# Patient Record
Sex: Male | Born: 1945 | Race: White | Hispanic: No | Smoking: Former smoker
Health system: Southern US, Community
[De-identification: ages and names within clinical notes are randomized; demographics above are authoritative.]

## PROBLEM LIST (undated history)

## (undated) DIAGNOSIS — R202 Paresthesia of skin: Secondary | ICD-10-CM

## (undated) DIAGNOSIS — I499 Cardiac arrhythmia, unspecified: Secondary | ICD-10-CM

## (undated) DIAGNOSIS — I480 Paroxysmal atrial fibrillation: Secondary | ICD-10-CM

## (undated) DIAGNOSIS — R200 Anesthesia of skin: Secondary | ICD-10-CM

## (undated) DIAGNOSIS — I10 Essential (primary) hypertension: Secondary | ICD-10-CM

## (undated) DIAGNOSIS — I6523 Occlusion and stenosis of bilateral carotid arteries: Secondary | ICD-10-CM

## (undated) DIAGNOSIS — N400 Enlarged prostate without lower urinary tract symptoms: Secondary | ICD-10-CM

## (undated) DIAGNOSIS — M1990 Unspecified osteoarthritis, unspecified site: Secondary | ICD-10-CM

## (undated) DIAGNOSIS — L570 Actinic keratosis: Secondary | ICD-10-CM

## (undated) DIAGNOSIS — E119 Type 2 diabetes mellitus without complications: Secondary | ICD-10-CM

## (undated) DIAGNOSIS — M549 Dorsalgia, unspecified: Secondary | ICD-10-CM

## (undated) DIAGNOSIS — I2510 Atherosclerotic heart disease of native coronary artery without angina pectoris: Secondary | ICD-10-CM

## (undated) DIAGNOSIS — C4490 Unspecified malignant neoplasm of skin, unspecified: Secondary | ICD-10-CM

## (undated) DIAGNOSIS — K219 Gastro-esophageal reflux disease without esophagitis: Secondary | ICD-10-CM

## (undated) DIAGNOSIS — R0600 Dyspnea, unspecified: Secondary | ICD-10-CM

## (undated) DIAGNOSIS — E785 Hyperlipidemia, unspecified: Secondary | ICD-10-CM

## (undated) DIAGNOSIS — C801 Malignant (primary) neoplasm, unspecified: Secondary | ICD-10-CM

## (undated) HISTORY — DX: Actinic keratosis: L57.0

## (undated) HISTORY — PX: PORT-A-CATH REMOVAL: SHX5289

## (undated) HISTORY — DX: Gastro-esophageal reflux disease without esophagitis: K21.9

## (undated) HISTORY — PX: TONSILLECTOMY: SUR1361

## (undated) HISTORY — PX: ABDOMINAL SURGERY: SHX537

## (undated) HISTORY — DX: Essential (primary) hypertension: I10

## (undated) HISTORY — DX: Hyperlipidemia, unspecified: E78.5

## (undated) HISTORY — PX: JOINT REPLACEMENT: SHX530

## (undated) HISTORY — PX: OTHER SURGICAL HISTORY: SHX169

---

## 2003-04-01 DIAGNOSIS — I10 Essential (primary) hypertension: Secondary | ICD-10-CM | POA: Insufficient documentation

## 2003-12-08 HISTORY — PX: COLONOSCOPY: SHX174

## 2004-03-31 DIAGNOSIS — C801 Malignant (primary) neoplasm, unspecified: Secondary | ICD-10-CM

## 2004-03-31 DIAGNOSIS — Z95828 Presence of other vascular implants and grafts: Secondary | ICD-10-CM

## 2004-03-31 DIAGNOSIS — C01 Malignant neoplasm of base of tongue: Secondary | ICD-10-CM

## 2004-03-31 HISTORY — PX: TONGUE BIOPSY: SHX1075

## 2004-03-31 HISTORY — DX: Malignant neoplasm of base of tongue: C01

## 2004-03-31 HISTORY — DX: Malignant (primary) neoplasm, unspecified: C80.1

## 2004-03-31 HISTORY — DX: Presence of other vascular implants and grafts: Z95.828

## 2004-10-29 ENCOUNTER — Ambulatory Visit: Payer: Self-pay | Admitting: Otolaryngology

## 2004-11-05 ENCOUNTER — Ambulatory Visit: Payer: Self-pay | Admitting: Oncology

## 2004-11-08 ENCOUNTER — Ambulatory Visit: Payer: Self-pay | Admitting: Oncology

## 2004-11-13 ENCOUNTER — Ambulatory Visit: Payer: Self-pay | Admitting: General Surgery

## 2004-11-29 ENCOUNTER — Ambulatory Visit: Payer: Self-pay | Admitting: Oncology

## 2004-12-29 ENCOUNTER — Ambulatory Visit: Payer: Self-pay | Admitting: Oncology

## 2005-01-29 ENCOUNTER — Ambulatory Visit: Payer: Self-pay | Admitting: Oncology

## 2005-02-28 ENCOUNTER — Ambulatory Visit: Payer: Self-pay | Admitting: Oncology

## 2005-03-31 ENCOUNTER — Ambulatory Visit: Payer: Self-pay | Admitting: Oncology

## 2005-04-18 ENCOUNTER — Ambulatory Visit: Payer: Self-pay | Admitting: Oncology

## 2005-05-01 ENCOUNTER — Ambulatory Visit: Payer: Self-pay | Admitting: Oncology

## 2005-05-29 ENCOUNTER — Ambulatory Visit: Payer: Self-pay | Admitting: Oncology

## 2005-06-29 ENCOUNTER — Ambulatory Visit: Payer: Self-pay | Admitting: Oncology

## 2005-07-29 ENCOUNTER — Ambulatory Visit: Payer: Self-pay | Admitting: Oncology

## 2005-08-29 ENCOUNTER — Ambulatory Visit: Payer: Self-pay | Admitting: Oncology

## 2005-09-28 ENCOUNTER — Ambulatory Visit: Payer: Self-pay | Admitting: Oncology

## 2005-10-31 ENCOUNTER — Ambulatory Visit: Payer: Self-pay | Admitting: Oncology

## 2005-11-04 ENCOUNTER — Ambulatory Visit: Payer: Self-pay | Admitting: Oncology

## 2006-01-15 ENCOUNTER — Ambulatory Visit: Payer: Self-pay | Admitting: Oncology

## 2006-02-12 ENCOUNTER — Ambulatory Visit: Payer: Self-pay | Admitting: Oncology

## 2006-02-28 ENCOUNTER — Ambulatory Visit: Payer: Self-pay | Admitting: Oncology

## 2006-03-11 ENCOUNTER — Ambulatory Visit: Payer: Self-pay | Admitting: General Practice

## 2006-05-06 ENCOUNTER — Ambulatory Visit: Payer: Self-pay | Admitting: Oncology

## 2006-05-30 ENCOUNTER — Ambulatory Visit: Payer: Self-pay | Admitting: Oncology

## 2006-06-30 ENCOUNTER — Ambulatory Visit: Payer: Self-pay | Admitting: Oncology

## 2006-07-10 ENCOUNTER — Ambulatory Visit: Payer: Self-pay | Admitting: Oncology

## 2006-07-16 ENCOUNTER — Ambulatory Visit: Payer: Self-pay | Admitting: Radiation Oncology

## 2006-07-30 ENCOUNTER — Ambulatory Visit: Payer: Self-pay | Admitting: Oncology

## 2006-09-12 ENCOUNTER — Ambulatory Visit: Payer: Self-pay | Admitting: Emergency Medicine

## 2006-09-14 ENCOUNTER — Ambulatory Visit: Payer: Self-pay | Admitting: Emergency Medicine

## 2006-10-30 ENCOUNTER — Ambulatory Visit: Payer: Self-pay | Admitting: Oncology

## 2006-11-20 ENCOUNTER — Ambulatory Visit: Payer: Self-pay | Admitting: Oncology

## 2006-11-30 ENCOUNTER — Ambulatory Visit: Payer: Self-pay | Admitting: Oncology

## 2007-01-28 ENCOUNTER — Ambulatory Visit: Payer: Self-pay | Admitting: Oncology

## 2007-01-30 ENCOUNTER — Ambulatory Visit: Payer: Self-pay | Admitting: Oncology

## 2007-05-30 ENCOUNTER — Ambulatory Visit: Payer: Self-pay | Admitting: Oncology

## 2007-06-02 ENCOUNTER — Ambulatory Visit: Payer: Self-pay | Admitting: Oncology

## 2007-06-30 ENCOUNTER — Ambulatory Visit: Payer: Self-pay | Admitting: Oncology

## 2007-07-30 ENCOUNTER — Ambulatory Visit: Payer: Self-pay | Admitting: Oncology

## 2007-08-05 ENCOUNTER — Ambulatory Visit: Payer: Self-pay | Admitting: Oncology

## 2007-08-30 ENCOUNTER — Ambulatory Visit: Payer: Self-pay | Admitting: Oncology

## 2007-09-03 ENCOUNTER — Ambulatory Visit: Payer: Self-pay | Admitting: Oncology

## 2007-09-29 ENCOUNTER — Ambulatory Visit: Payer: Self-pay | Admitting: Oncology

## 2008-01-04 ENCOUNTER — Ambulatory Visit: Payer: Self-pay | Admitting: Oncology

## 2008-01-30 ENCOUNTER — Ambulatory Visit: Payer: Self-pay | Admitting: Oncology

## 2008-02-29 ENCOUNTER — Ambulatory Visit: Payer: Self-pay | Admitting: Oncology

## 2008-03-15 ENCOUNTER — Ambulatory Visit: Payer: Self-pay | Admitting: Oncology

## 2008-03-31 ENCOUNTER — Ambulatory Visit: Payer: Self-pay | Admitting: Oncology

## 2008-05-29 ENCOUNTER — Ambulatory Visit: Payer: Self-pay | Admitting: Oncology

## 2008-06-02 ENCOUNTER — Ambulatory Visit: Payer: Self-pay | Admitting: Oncology

## 2008-06-29 ENCOUNTER — Ambulatory Visit: Payer: Self-pay | Admitting: Oncology

## 2008-07-29 ENCOUNTER — Ambulatory Visit: Payer: Self-pay | Admitting: Oncology

## 2008-08-02 ENCOUNTER — Ambulatory Visit: Payer: Self-pay | Admitting: Oncology

## 2008-08-29 ENCOUNTER — Ambulatory Visit: Payer: Self-pay | Admitting: Oncology

## 2009-01-29 ENCOUNTER — Ambulatory Visit: Payer: Self-pay | Admitting: Oncology

## 2009-02-05 ENCOUNTER — Ambulatory Visit: Payer: Self-pay | Admitting: Oncology

## 2009-02-28 ENCOUNTER — Ambulatory Visit: Payer: Self-pay | Admitting: Oncology

## 2009-05-29 ENCOUNTER — Ambulatory Visit: Payer: Self-pay | Admitting: Oncology

## 2009-05-31 ENCOUNTER — Ambulatory Visit: Payer: Self-pay | Admitting: Oncology

## 2009-06-29 ENCOUNTER — Ambulatory Visit: Payer: Self-pay | Admitting: Oncology

## 2009-07-29 ENCOUNTER — Ambulatory Visit: Payer: Self-pay | Admitting: Oncology

## 2009-08-08 ENCOUNTER — Ambulatory Visit: Payer: Self-pay | Admitting: Oncology

## 2009-08-29 ENCOUNTER — Ambulatory Visit: Payer: Self-pay | Admitting: Oncology

## 2009-09-04 DIAGNOSIS — C4491 Basal cell carcinoma of skin, unspecified: Secondary | ICD-10-CM

## 2009-09-04 HISTORY — DX: Basal cell carcinoma of skin, unspecified: C44.91

## 2010-08-09 ENCOUNTER — Ambulatory Visit: Payer: Self-pay | Admitting: Oncology

## 2010-08-30 ENCOUNTER — Ambulatory Visit: Payer: Self-pay | Admitting: Oncology

## 2011-04-01 HISTORY — PX: TRIGGER FINGER RELEASE: SHX641

## 2011-08-11 ENCOUNTER — Ambulatory Visit: Payer: Self-pay | Admitting: Oncology

## 2011-08-11 LAB — CBC CANCER CENTER
Basophil #: 0 x10 3/mm (ref 0.0–0.1)
Eosinophil #: 0 x10 3/mm (ref 0.0–0.7)
Eosinophil %: 0.6 %
HGB: 15.1 g/dL (ref 13.0–18.0)
Lymphocyte %: 13.4 %
MCH: 29.7 pg (ref 26.0–34.0)
MCHC: 32.3 g/dL (ref 32.0–36.0)
MCV: 92 fL (ref 80–100)
Monocyte #: 0.4 x10 3/mm (ref 0.2–1.0)
Neutrophil #: 5.5 x10 3/mm (ref 1.4–6.5)
Neutrophil %: 79.6 %
Platelet: 255 x10 3/mm (ref 150–440)
RBC: 5.09 10*6/uL (ref 4.40–5.90)
WBC: 7 x10 3/mm (ref 3.8–10.6)

## 2011-08-11 LAB — COMPREHENSIVE METABOLIC PANEL
Albumin: 3.8 g/dL (ref 3.4–5.0)
BUN: 20 mg/dL — ABNORMAL HIGH (ref 7–18)
Bilirubin,Total: 0.4 mg/dL (ref 0.2–1.0)
Co2: 30 mmol/L (ref 21–32)
Osmolality: 285 (ref 275–301)
Sodium: 140 mmol/L (ref 136–145)

## 2011-08-11 LAB — TSH: Thyroid Stimulating Horm: 1.83 u[IU]/mL

## 2011-08-30 ENCOUNTER — Ambulatory Visit: Payer: Self-pay | Admitting: Oncology

## 2011-11-20 ENCOUNTER — Encounter: Payer: Self-pay | Admitting: Orthopedic Surgery

## 2011-11-30 ENCOUNTER — Encounter: Payer: Self-pay | Admitting: Orthopedic Surgery

## 2012-02-03 LAB — CBC AND DIFFERENTIAL
HCT: 49 % (ref 41–53)
HEMOGLOBIN: 16.3 g/dL (ref 13.5–17.5)
Neutrophils Absolute: 6 /uL
PLATELETS: 337 10*3/uL (ref 150–399)
WBC: 7.6 10^3/mL

## 2012-02-03 LAB — TSH: TSH: 1.97 u[IU]/mL (ref 0.41–5.90)

## 2012-03-31 HISTORY — PX: PARTIAL HIP ARTHROPLASTY: SHX733

## 2012-11-01 LAB — BASIC METABOLIC PANEL
BUN: 17 mg/dL (ref 4–21)
CREATININE: 1 mg/dL (ref 0.6–1.3)
Glucose: 188 mg/dL
Potassium: 4.6 mmol/L (ref 3.4–5.3)
SODIUM: 141 mmol/L (ref 137–147)

## 2013-01-12 ENCOUNTER — Ambulatory Visit: Payer: Self-pay | Admitting: General Practice

## 2013-01-12 LAB — URINALYSIS, COMPLETE
Bacteria: NONE SEEN
Bilirubin,UR: NEGATIVE
Blood: NEGATIVE
Glucose,UR: NEGATIVE mg/dL
Ketone: NEGATIVE
Leukocyte Esterase: NEGATIVE
Nitrite: NEGATIVE
Ph: 7
Protein: NEGATIVE
RBC,UR: <1 /HPF
Specific Gravity: 1.011
Squamous Epithelial: <1
WBC UR: <1 /HPF

## 2013-01-12 LAB — CBC
HCT: 47.5 %
HGB: 16.1 g/dL
MCH: 30.5 pg
MCHC: 34 g/dL
MCV: 90 fL
Platelet: 296 10*3/uL
RBC: 5.3 x10 6/mm 3
RDW: 13.7 %
WBC: 8.5 10*3/uL

## 2013-01-12 LAB — BASIC METABOLIC PANEL WITH GFR
Anion Gap: 4 — ABNORMAL LOW
BUN: 20 mg/dL — ABNORMAL HIGH
Calcium, Total: 9.3 mg/dL
Chloride: 102 mmol/L
Co2: 31 mmol/L
Creatinine: 1.17 mg/dL
EGFR (African American): >60
EGFR (Non-African Amer.): >60
Glucose: 154 mg/dL — ABNORMAL HIGH
Osmolality: 280
Potassium: 4.2 mmol/L
Sodium: 137 mmol/L

## 2013-01-12 LAB — SEDIMENTATION RATE: Erythrocyte Sed Rate: 1 mm/h

## 2013-01-12 LAB — MRSA PCR SCREENING

## 2013-01-12 LAB — APTT: Activated PTT: 29.5 s

## 2013-01-12 LAB — PROTIME-INR
INR: 0.9
Prothrombin Time: 12 s

## 2013-01-14 LAB — URINE CULTURE

## 2013-01-24 ENCOUNTER — Inpatient Hospital Stay: Payer: Self-pay | Admitting: General Practice

## 2013-01-24 LAB — CK TOTAL AND CKMB (NOT AT ARMC)
CK, Total: 367 U/L — ABNORMAL HIGH (ref 35–232)
CK-MB: 3.3 ng/mL (ref 0.5–3.6)
CK-MB: 3.2 ng/mL (ref 0.5–3.6)

## 2013-01-25 LAB — BASIC METABOLIC PANEL
Anion Gap: 8 (ref 7–16)
Calcium, Total: 8.3 mg/dL — ABNORMAL LOW (ref 8.5–10.1)
Chloride: 100 mmol/L (ref 98–107)
Co2: 24 mmol/L (ref 21–32)
EGFR (African American): >60
Sodium: 132 mmol/L — ABNORMAL LOW (ref 136–145)

## 2013-01-25 LAB — CK TOTAL AND CKMB (NOT AT ARMC): CK-MB: 4.7 ng/mL — ABNORMAL HIGH (ref 0.5–3.6)

## 2013-01-25 LAB — PLATELET COUNT: Platelet: 257 10*3/uL (ref 150–440)

## 2013-01-25 LAB — LIPID PANEL
Cholesterol: 152 mg/dL (ref 0–200)
HDL Cholesterol: 38 mg/dL — ABNORMAL LOW (ref 40–60)
Triglycerides: 277 mg/dL — ABNORMAL HIGH (ref 0–200)
VLDL Cholesterol, Calc: 55 mg/dL — ABNORMAL HIGH (ref 5–40)

## 2013-01-25 LAB — HEMOGLOBIN: HGB: 13.8 g/dL (ref 13.0–18.0)

## 2013-01-25 LAB — TSH: Thyroid Stimulating Horm: 3.82 u[IU]/mL

## 2013-01-26 LAB — BASIC METABOLIC PANEL
Calcium, Total: 8.3 mg/dL — ABNORMAL LOW (ref 8.5–10.1)
Chloride: 101 mmol/L (ref 98–107)
Co2: 26 mmol/L (ref 21–32)
Creatinine: 1.18 mg/dL (ref 0.60–1.30)
EGFR (African American): >60
Glucose: 138 mg/dL — ABNORMAL HIGH (ref 65–99)
Osmolality: 265 (ref 275–301)
Sodium: 130 mmol/L — ABNORMAL LOW (ref 136–145)

## 2013-01-26 LAB — HEMOGLOBIN: HGB: 11.8 g/dL — ABNORMAL LOW (ref 13.0–18.0)

## 2013-01-26 LAB — PATHOLOGY REPORT

## 2013-03-15 LAB — PSA: PSA: 1.7

## 2013-08-18 LAB — LIPID PANEL
CHOLESTEROL: 207 mg/dL — AB (ref 0–200)
HDL: 36 mg/dL (ref 35–70)
LDL CALC: 127 mg/dL
Triglycerides: 219 mg/dL — AB (ref 40–160)

## 2013-11-01 ENCOUNTER — Encounter: Payer: Self-pay | Admitting: General Surgery

## 2013-12-08 ENCOUNTER — Ambulatory Visit (INDEPENDENT_AMBULATORY_CARE_PROVIDER_SITE_OTHER): Payer: Medicare Other | Admitting: General Surgery

## 2013-12-08 ENCOUNTER — Encounter: Payer: Self-pay | Admitting: General Surgery

## 2013-12-08 VITALS — BP 130/70 | HR 70 | Resp 14 | Ht 67.0 in | Wt 190.0 lb

## 2013-12-08 DIAGNOSIS — Z1211 Encounter for screening for malignant neoplasm of colon: Secondary | ICD-10-CM

## 2013-12-08 DIAGNOSIS — K219 Gastro-esophageal reflux disease without esophagitis: Secondary | ICD-10-CM

## 2013-12-08 MED ORDER — POLYETHYLENE GLYCOL 3350 17 GM/SCOOP PO POWD: ORAL | Status: DC

## 2013-12-08 NOTE — Progress Notes (Signed)
Patient ID: Blake [name], male   DOB: June 06, 1945, 68 y.o.   MRN: [id number]  Chief Complaint  Patient presents with  . Other    colonoscopy    HPI Blake [name] is a 68 y.o. male here today for a evaluation of a colonoscopy . Patient last colonoscopy was done in 12/08/2003.No bowel problems. He continues to have reflex symptoms. Mostly at night.   HPI  Past Medical History  Diagnosis Date  . GERD (gastroesophageal reflux disease)   . Hypertension   . Hyperlipidemia     Past Surgical History  Procedure Laterality Date  . Partial hip arthroplasty  2014  . Tongue biopsy  2006  . Port-a-cath removal    . Colonoscopy  12/08/2003    History reviewed. No pertinent family history.  Social History History  Substance Use Topics  . Smoking status: Former Smoker -- 1.00 packs/day for 30 years    Types: Cigarettes  . Smokeless tobacco: Never Used  . Alcohol Use: Yes    Allergies  Allergen Reactions  . Sulfa Antibiotics Rash    Current Outpatient Prescriptions  Medication Sig Dispense Refill  . aspirin 81 MG tablet Take 81 mg by mouth daily.      [name] Kitchen b complex vitamins tablet Take 1 tablet by mouth daily.      [name] Kitchen lisinopril (PRINIVIL,ZESTRIL) 20 MG tablet Take 20 mg by mouth daily.       . metoprolol succinate (TOPROL-XL) 50 MG 24 hr tablet Take 50 mg by mouth daily.       [name] Kitchen omeprazole (PRILOSEC) 20 MG capsule Take 20 mg by mouth daily.       . polyethylene glycol powder (GLYCOLAX/MIRALAX) powder 255 grams one bottle for colonoscopy prep  255 g  0   No current facility-administered medications for this visit.    Review of Systems Review of Systems  Constitutional: Negative.   Respiratory: Negative.   Cardiovascular: Negative.     Blood pressure 130/70, pulse 70, resp. rate 14, height 5\' 7"  (1.702 m), weight 190 lb (86.183 kg).  Physical Exam Physical Exam  Constitutional: He is oriented to person, place, and time. He appears well-developed and well-nourished.  Eyes:  Conjunctivae are normal. No scleral icterus.  Neck: Neck supple. No mass and no thyromegaly present.  Cardiovascular: Normal rate, regular rhythm and normal heart sounds.   Pulmonary/Chest: Effort normal and breath sounds normal.  Abdominal: Soft. Bowel sounds are normal. There is no hepatomegaly. There is no tenderness. No hernia.  Neurological: He is alert and oriented to person, place, and time.  Skin: Skin is warm and dry.    Data Reviewed Notes reviewed  Assessment    Jerrye Bushy and colonoscopy screening.  Feel an endoscopy at same time as colonoscopy ids reasonable.    Plan    Discussed colonoscopy and endoscopy with the patient. He is agreeable.  Patient has been scheduled for a colonoscopy on 01-25-14 at [facility]. It is okay for patient to continue 81 mg aspirin.       SANKAR,SEEPLAPUTHUR G 12/08/2013, 1:59 PM

## 2013-12-08 NOTE — Patient Instructions (Addendum)
Colonoscopy A colonoscopy is an exam to look at the entire large intestine (colon). This exam can help find problems such as tumors, polyps, inflammation, and areas of bleeding. The exam takes about 1 hour.  LET [facility] CARE PROVIDER KNOW ABOUT:   Any allergies you have.  All medicines you are taking, including vitamins, herbs, eye drops, creams, and over-the-counter medicines.  Previous problems you or members of your family have had with the use of anesthetics.  Any blood disorders you have.  Previous surgeries you have had.  Medical conditions you have. RISKS AND COMPLICATIONS  Generally, this is a safe procedure. However, as with any procedure, complications can occur. Possible complications include:  Bleeding.  Tearing or rupture of the colon wall.  Reaction to medicines given during the exam.  Infection (rare). BEFORE THE PROCEDURE   Ask your health care provider about changing or stopping your regular medicines.  You may be prescribed an oral bowel prep. This involves drinking a large amount of medicated liquid, starting the day before your procedure. The liquid will cause you to have multiple loose stools until your stool is almost clear or light green. This cleans out your colon in preparation for the procedure.  Do not eat or drink anything else once you have started the bowel prep, unless your health care provider tells you it is safe to do so.  Arrange for someone to drive you home after the procedure. PROCEDURE   You will be given medicine to help you relax (sedative).  You will lie on your side with your knees bent.  A long, flexible tube with a light and camera on the end (colonoscope) will be inserted through the rectum and into the colon. The camera sends video back to a computer screen as it moves through the colon. The colonoscope also releases carbon dioxide gas to inflate the colon. This helps your health care provider see the area better.  During  the exam, your health care provider may take a small tissue sample (biopsy) to be examined under a microscope if any abnormalities are found.  The exam is finished when the entire colon has been viewed. AFTER THE PROCEDURE   Do not drive for 24 hours after the exam.  You may have a small amount of blood in your stool.  You may pass moderate amounts of gas and have mild abdominal cramping or bloating. This is caused by the gas used to inflate your colon during the exam.  Ask when your test results will be ready and how you will get your results. Make sure you get your test results. Document Released: 03/14/2000 Document Revised: 01/05/2013 Document Reviewed: 11/22/2012 [employer] Patient Information 2015 [place], [place]. This information is not intended to replace advice given to you by your health care provider. Make sure you discuss any questions you have with your health care provider.  Patient has been scheduled for a colonoscopy on 01-25-14 at [facility]. It is okay for patient to continue 81 mg aspirin.

## 2014-01-19 ENCOUNTER — Other Ambulatory Visit: Payer: Self-pay | Admitting: General Surgery

## 2014-01-19 DIAGNOSIS — K219 Gastro-esophageal reflux disease without esophagitis: Secondary | ICD-10-CM

## 2014-01-19 DIAGNOSIS — Z1211 Encounter for screening for malignant neoplasm of colon: Secondary | ICD-10-CM

## 2014-01-25 ENCOUNTER — Ambulatory Visit: Payer: Self-pay | Admitting: General Surgery

## 2014-01-25 DIAGNOSIS — K317 Polyp of stomach and duodenum: Secondary | ICD-10-CM

## 2014-01-25 DIAGNOSIS — K229 Disease of esophagus, unspecified: Secondary | ICD-10-CM

## 2014-01-25 DIAGNOSIS — Z1211 Encounter for screening for malignant neoplasm of colon: Secondary | ICD-10-CM

## 2014-01-25 HISTORY — PX: UPPER GI ENDOSCOPY: SHX6162

## 2014-01-25 LAB — HM COLONOSCOPY

## 2014-01-26 ENCOUNTER — Encounter: Payer: Self-pay | Admitting: General Surgery

## 2014-01-26 ENCOUNTER — Telehealth: Payer: Self-pay | Admitting: *Deleted

## 2014-01-26 NOTE — Telephone Encounter (Signed)
Post EGD and colonoscopy 01-25-14. He states he had a fever yesterday evening 102.8 but by bedtime it was 99. He woke up this morning and went to work. He states he is just "tired feeling". No GI symptoms or cough.

## 2014-04-03 ENCOUNTER — Ambulatory Visit: Payer: Self-pay

## 2014-06-14 ENCOUNTER — Ambulatory Visit: Payer: Self-pay | Admitting: Internal Medicine

## 2014-06-26 DIAGNOSIS — I2510 Atherosclerotic heart disease of native coronary artery without angina pectoris: Secondary | ICD-10-CM | POA: Insufficient documentation

## 2014-07-11 DIAGNOSIS — M6530 Trigger finger, unspecified finger: Secondary | ICD-10-CM | POA: Insufficient documentation

## 2014-07-21 NOTE — Consult Note (Signed)
PATIENT NAME:  Blake [name], Blake [name] MR#:  [id number] DATE OF BIRTH:  1945/04/21  DATE OF CONSULTATION:  01/24/2013  REFERRING PHYSICIAN:  Dr. Marry Guan CONSULTING PHYSICIAN:  Demetrios Loll, MD  PRIMARY CARE PHYSICIAN: Dr. Caryn Section  REASON FOR CONSULTATION:  Chest pain today.   HISTORY OF PRESENT ILLNESS:  The patient review of history and the patient is 69 year old Caucasian male with a history of hypertension, hyperlipidemia, arthritis. The patient was admitted for right hip arthritis and is status post right arthroplasty today. The patient has a right hip pain after surgery and he was given morphine and developed some rash and chest tightness. The patient's chest heaviness lasted about 15 minutes but he denies any diaphoresis. No palpitations, orthopnea, or nocturnal dyspnea. The patient denies any fever, chills, cough, phlegm or shortness of breath and Dr. Marry Guan requested a hospitalist for consult for chest pain.   PAST MEDICAL HISTORY: Hypertension, hyperlipidemia, osteoarthritis, throat cancer.   PAST SURGICAL HISTORY: Right total hip replacement.   SOCIAL HISTORY: No smoking or drinking or illicit drugs.   ALLERGIES: SULFA DRUGS.   HOME MEDICATIONS:  1.  Omeprazole 20 mg p.o. daily.  2.  Multivitamin 1 tab once a day.  3.  Lopressor 50 mg p.o. daily.  4.  Lisinopril 20 mg p.o. daily.  5.  Etodolac 300 mg p.o. t.i.d.  6.  Atorvastatin 40 mg p.o. daily.   REVIEW OF SYSTEMS:  CONSTITUTIONAL: The patient denies any fever or chills. No headache or dizziness. No weakness.  EYES: No double vision or blurry vision.   EARS, NOSE, THROAT: No epistaxis, slurred speech or dysphagia.  CARDIOVASCULAR: Positive for chest pain tightness, but no palpitations, orthopnea, or nocturnal dyspnea. No leg edema.  PULMONARY: No cough, sputum, shortness of breath or hemoptysis.  GASTROINTESTINAL: No abdominal pain, nausea, vomiting or diarrhea. No melena or bloody stool.  GENITOURINARY: No dysuria, hematuria, or  incontinence.  SKIN: No rash or jaundice.  NEUROLOGY: No syncope, loss of consciousness or seizure.  HEMATOLOGY: No easy bruising or bleeding.  ENDOCRINE: No polyuria, polydipsia, heat or cold intolerance.   PHYSICAL EXAMINATION: VITAL SIGNS: Temperature 98.6, blood pressure 110/71, respirations 18, oxygen saturation 94% on room on room air, pulse 88.  GENERAL: The patient is alert, awake, oriented, in no acute distress.  HEENT: Pupils round, equal and reactive to light and accommodation. Moist oral mucosa. Clear oropharynx.  NECK: Supple. No JVD or carotid bruits. No lymphadenopathy. No thyromegaly.  CARDIOVASCULAR: S1, S2 regular rate and rhythm. No murmurs, gallops.  PULMONARY: Bilateral air entry. No wheezing or rales. No use of accessory muscle to breathe.  ABDOMEN: Obese, soft. No distention or tenderness. No organomegaly. Bowel sounds present.  EXTREMITIES: No edema, clubbing or cyanosis. No calf tenderness. Strong bilateral pedal pulses.  SKIN: No rash or jaundice.  NEUROLOGIC: A and O x 3. No focal deficit. Power 5/5. Sensation intact.   LABORATORY DATA: No current labs today, but we checked troponin level, the first is 0.02, CK 367, CK-MB 3.3.   EKG showed normal sinus rhythm at 76 BPM.   IMPRESSION: 1.  Chest pain, need to rule out acute coronary syndrome.  2.  Hypertension.  3.  Hyperlipidemia.  4.  Osteoarthritis.  RECOMMENDATIONS:  1.  The patient was given aspirin 325 mg oral,  we will give statin and continue Lopressor, lisinopril and follow up troponin level, give a nitroglycerin as needed.   2.  We will follow up CBC BMP and labs.   3. Gastrointestinal and  deep vein thrombosis prophylaxis.  4.  I discussed the patient's condition and recommendations with the patient and the patient's wife and the patient wants full code.   TIME SPENT: About 45 minutes   ____________________________ Demetrios Loll, MD qc:cc D: 01/24/2013 19:08:09 ET T: 01/24/2013 20:21:50  ET JOB#: [id number]  cc: Demetrios Loll, MD, <Dictator> Demetrios Loll MD ELECTRONICALLY SIGNED 01/26/2013 18:21

## 2014-07-21 NOTE — Discharge Summary (Signed)
PATIENT NAME:  Blake [name], PAT MR#:  [id number] DATE OF BIRTH:  05-09-1945  DATE OF ADMISSION:  01/24/2013 DATE OF DISCHARGE:  01/27/2013  ADMITTING DIAGNOSIS: Degenerative arthrosis of the right hip.   DISCHARGE DIAGNOSES: 1.  Degenerative arthrosis of right hip. 2.  Chest pain postop felt to be secondary to reflux.   CONSULTATIONS: Dr. Bridgett Larsson, hospitalist.   HISTORY: The patient is a 69 year old who has been followed at [facility] for quite some time for discomfort to the right hip and groin region. He had tried to continue to be active but had been having difficulty with this because of the pain. He had localized most of the pain along the groin region. He had been seen by Dr.Chasnis for intra-articular cortisone injections, which gave him only minimal relief with no long-term resolution. The patient states that occasionally he has used a cane but nothing on a regular basis. He states that the pain had increased to the point that it was significantly interfering with his activities of daily living. X-rays taken in [place] demonstrated significant degenerative changes with full thickness loss of the articular cartilage superiorly. After discussion of the risks and benefits of surgical intervention, the patient expressed his understanding of the risks and benefits and agreed for plans for surgical intervention.   PROCEDURE: Right total hip arthroplasty.   ANESTHESIA: Spinal.   IMPLANTS UTILIZED: DePuy 13.5 mm large statue AML femoral stem, 50 mm outer diameter Pinnacle 100 cup, +4 mm neutral Pinnacle Marathon polyethylene liner and a 32 mm cobalt chrome hip ball with a +1 mm neck length.   HOSPITAL COURSE: The patient tolerated the procedure very well. He had no complications. He was then taken to PAC-U where he was stabilized and then transferred to the orthopedic floor. Upon being received on the orthopedic floor, the patient began having some chest pressure, discomfort.  A medical consultation was obtained. An EKG was obtained which did not reveal any cardiac issues. Cardiac troponins were also obtained which did not reveal any evidence of any cardiac issues. The patient states that he has had a history of some reflux and this is pretty much normal for him. He feels that his discomfort is secondary to having an empty stomach. He has had no radiation of his pain or shortness of breath. His vital signs have been stable throughout the entire time. The patient began receiving anticoagulation therapy of Lovenox 30 mg subcu q. 12 hours per anesthesia and pharmacy protocol. The patient was also fitted with the AV-I compression foot pumps bilaterally set at 80 mmHg. His calves have been nontender. There has been no evidence of any DVTs to the lower extremities. Negative Homans sign. The patient's heels were elevated off the bed using rolled towels.   The patient has denied any shortness of breath. Vital signs have been stable. He has been afebrile. Hemodynamically he was stable and no transfusions were needed.   Physical therapy was initiated on day 1 for gait training and transfers. Upon being discharged he was ambulating greater than 200 feet. He was able go up and down 4 sets of steps. He was independent with bed to chair transfers. Occupational therapy was also initiated on day 1 for ADL and assistive devices.   The patient's IV, Foley and Hemovac were DC'd on day 2 along with a dressing change. The wound was free of any drainage or signs of infection.   DISPOSITION: The patient is being discharged to home in improved stable condition.  DISCHARGE INSTRUCTIONS: He will continue to ambulate with full weight-bearing as tolerated. He is to continue using a walker until cleared by physical therapy to go to a quad cane.  He will receive home health PT. Elevate the heels off the bed. Continue TED stockings bilaterally. These may be removed at night, but are to be worn during the  day. Recommend that he continue using incentive spirometer q. 1 hour while awake at home. Also encourage cough and deep breathing q. 2 hours. He is to resume a regular diet. He will change the dressing as needed. His staples will be removed on 11/10 and apply benzoin and half-inch Steri-Strips. The patient is not to take a shower until the staples are removed. He has an appointment on December 9th at 10:45. He is to call the clinic sooner if any temperatures of 101.5 or greater or excessive bleeding.   The patient is to resume his regular medications that he was on prior to admission. He was given a prescription for oxycodone 5 to 10 mg q. 4 to 6 hours p.r.n. for pain, tramadol 50 to 100 mg q. 4 to 6 hours p.r.n. for pain and Lovenox 40 mg subcu q. day for 14 days and then discontinue and begin taking one 81 mg enteric-coated aspirin.   PAST MEDICAL HISTORY: 1.  Hypertension. 2.  Hyperlipidemia.  3.  Cancer of the tongue.  ____________________________ Vance Peper, PA jrw:sb D: 01/27/2013 07:49:03 ET T: 01/27/2013 08:02:50 ET JOB#: [id number]  cc: Vance Peper, PA, <Dictator> JON WOLFE PA ELECTRONICALLY SIGNED 01/29/2013 20:54

## 2014-07-21 NOTE — Op Note (Signed)
PATIENT NAME:  Blake [name], Blake [name] MR#:  [id number] DATE OF BIRTH:  07/31/1945  DATE OF PROCEDURE:  01/24/2013  PREOPERATIVE DIAGNOSIS: Degenerative arthrosis of the right hip.   POSTOPERATIVE DIAGNOSIS: Degenerative arthrosis of the right hip.   PROCEDURE PERFORMED: Right total hip arthroplasty.   SURGEON: Skip Estimable, M.D.   ASSISTANT:  Vance Peper, PA (required to maintain retraction throughout the procedure).   ANESTHESIA: Spinal.   ESTIMATED BLOOD LOSS: 350 mL.   FLUIDS REPLACED: 1500 mL of crystalloid.   DRAINS: Two medium drains to Hemovac reservoir.   IMPLANTS UTILIZED: DePuy 13.5 mm large stature AML femoral stem, 50 mm outer diameter Pinnacle cup with pin and a CLE 100 acetabular component, +4 mm neutral Pinnacle Marathon polyethylene liner and a 32 mm cobalt chrome hip ball with a +1 mm neck length.   INDICATIONS FOR SURGERY: The patient is a 69 year old male who has been seen for complaints of progressive right hip and groin pain. X-rays demonstrated significant degenerative changes with full-thickness loss of articular cartilage superiorly. After discussion of the risks and benefits of surgical intervention, the patient expressed understanding of the risks and benefits and agreed with plans for surgical intervention.   PROCEDURE IN DETAIL: The patient was brought to the operating room and, after adequate spinal anesthesia was achieved, the patient was placed in a left lateral decubitus position. Axillary roll was placed and all bony prominences were well padded. The patient's right hip and leg were cleaned and prepped with alcohol and DuraPrep, draped in the usual sterile fashion. A "timeout" was performed as per usual protocol. A lateral curvilinear incision was made gently curving towards the posterior superior iliac spine. IT band was incised in line with the skin incision, and fibers of the gluteus maximus were split in line. Piriformis tendon was identified, skeletonized  and  incised at its insertion, and the proximal femur and reflected posteriorly. In a similar fashion, short external rotators were incised and reflected posteriorly. A T-type posterior capsulotomy was performed. Prior to dislocation of the femoral head, a threaded Steinmann pin was inserted through a separate stab incision into the pelvis superior to the acetabulum and bent in the form of a stylus so as to assess limb length and hip offset throughout the procedure. The femoral head was then dislocated posteriorly. Severe degenerative changes were noted with full thickness loss of articular cartilage superiorly. Femoral neck cut was performed using an oscillating saw. The anterior capsule was elevated off of the femoral neck. Inspection of the acetabulum also demonstrated significant degenerative changes. The labrum was excised. The acetabulum was reamed in a sequential fashion up to a 49 mm diameter. Excellent punctate bleeding bone was encountered. A 50 mm outer diameter Pinnacle 100 acetabular component was positioned and impacted into place. Excellent scratch fit was appreciated. A +4 neutral polyethylene trial was inserted and attention was turned to the proximal femur. Pilot hole for reaming of the proximal femoral canal was created using a high-speed bur. Proximal femoral canal was reamed in a sequential fashion up to a 13 mm diameter. This allowed for approximately 5.5 to 6 cm of scratch fit. The proximal femur was then prepared using a 13.5 mm aggressive side-biting reamer. Serial broaches were inserted up to a 13.5 mm large stature broach. The calcar region was planed and trial reduction was performed with a 32 mm trial ball with a +1 mm neck length. This allowed for excellent equalization of limb lengths and improved hip offset. Excellent stability was appreciated  both anteriorly and posteriorly. Trial components were removed. The acetabular shell was irrigated and suctioned dry. A +4 mm neutral Pinnacle  Marathon polyethylene liner was positioned and impacted into place. Next, a 13.5 mm large stature AML femoral component was positioned and impacted into place. Excellent scratch fit was appreciated. Trial reduction was again performed with a 32 mm hip ball with a +1 mm neck length. Again, excellent stability and equalization of limb lengths was appreciated. Trial hip ball was removed. The Morse taper was cleaned and dried. A 32 mm cobalt chrome hip ball with a +1 mm neck length was placed on the trunnion and impacted into place. The hip was reduced and placed through range of motion. Again, excellent equalization of limb lengths and hip offset was noted. Excellent stability was appreciated both anteriorly and posteriorly.   The wound was irrigated with copious amounts of normal saline with antibiotic solution using pulsatile lavage and then suctioned dry. Good hemostasis was appreciated. The posterior capsulotomy was repaired using #5 Ethibond. The piriformis tendon was reapproximated on the undersurface of the gluteus medius tendon using #5 Ethibond. Two medium drains were placed in the wound bed and brought out through a separate stab incision to be attached to a Hemovac reservoir. IT band was repaired using interrupted sutures of #1 Vicryl. The subcutaneous tissue was approximated in layers using first #0 Vicryl, followed by 2-0 Vicryl. Skin was closed with skin staples. A sterile dressing was applied.   The patient tolerated the procedure well. He was transported to the recovery room in stable condition.     ____________________________ Laurice Record. Holley Bouche., MD jph:dmm D: 01/24/2013 10:16:57 ET T: 01/24/2013 11:16:26 ET JOB#: [id number]  cc: Jeneen Rinks P. Holley Bouche., MD, <Dictator> JAMES P Holley Bouche MD ELECTRONICALLY SIGNED 01/30/2013 11:07

## 2014-07-24 LAB — SURGICAL PATHOLOGY

## 2014-07-30 NOTE — Discharge Summary (Signed)
PATIENT NAME:  Blake [name], Blake [name] MR#:  [id number] DATE OF BIRTH:  September 28, 1945  DATE OF ADMISSION:  06/14/2014 DATE OF DISCHARGE:  06/15/2014  DISCHARGE DIAGNOSIS:  Coronary artery disease with angina.   HISTORY OF PRESENT ILLNESS: This is a 68 year old male with progressive symptoms of angina [place] class III who has had an abnormal stress test with high risk needing further evaluation, and underwent a cardiac catheterization showing minimal 2 vessel coronary artery disease of left circumflex and right coronary artery, with total occlusion of mid left anterior descending artery. The patient therefore underwent a PCI and drug-eluting XIENCE stent without complication and felt fairly well. The patient had no evidence of significant symptoms thereafter and was ambulating well without any significant concerns. The patient was discharged to home in good condition with followup needed in 2 weeks.   DISCHARGE MEDICATIONS: Include lisinopril 20 mg each day, Plavix 75 mg each day, aspirin 325 mg each day, metoprolol 50 mg each day, pravastatin 80 mg each day, and tramadol as needed. He is to call if there is any other significant issues or questions.    ____________________________ Corey Skains, MD bjk:sp D: 06/15/2014 07:51:52 ET T: 06/15/2014 12:25:22 ET JOB#: [id number]  cc: Corey Skains, MD, <Dictator> Corey Skains MD ELECTRONICALLY SIGNED 06/19/2014 13:27

## 2015-08-24 DIAGNOSIS — J309 Allergic rhinitis, unspecified: Secondary | ICD-10-CM | POA: Insufficient documentation

## 2015-08-24 DIAGNOSIS — E785 Hyperlipidemia, unspecified: Secondary | ICD-10-CM | POA: Insufficient documentation

## 2015-08-24 DIAGNOSIS — K317 Polyp of stomach and duodenum: Secondary | ICD-10-CM | POA: Insufficient documentation

## 2015-08-24 DIAGNOSIS — Z85810 Personal history of malignant neoplasm of tongue: Secondary | ICD-10-CM | POA: Insufficient documentation

## 2015-08-24 DIAGNOSIS — E119 Type 2 diabetes mellitus without complications: Secondary | ICD-10-CM | POA: Insufficient documentation

## 2016-04-22 ENCOUNTER — Encounter (INDEPENDENT_AMBULATORY_CARE_PROVIDER_SITE_OTHER): Payer: Self-pay | Admitting: Vascular Surgery

## 2016-04-22 ENCOUNTER — Ambulatory Visit (INDEPENDENT_AMBULATORY_CARE_PROVIDER_SITE_OTHER): Payer: Medicare Other | Admitting: Vascular Surgery

## 2016-04-22 VITALS — BP 149/77 | HR 96 | Resp 16 | Ht 65.0 in | Wt 183.6 lb

## 2016-04-22 DIAGNOSIS — I6523 Occlusion and stenosis of bilateral carotid arteries: Secondary | ICD-10-CM | POA: Diagnosis not present

## 2016-04-22 DIAGNOSIS — E785 Hyperlipidemia, unspecified: Secondary | ICD-10-CM

## 2016-04-22 DIAGNOSIS — E119 Type 2 diabetes mellitus without complications: Secondary | ICD-10-CM | POA: Diagnosis not present

## 2016-04-22 DIAGNOSIS — I10 Essential (primary) hypertension: Secondary | ICD-10-CM

## 2016-04-22 DIAGNOSIS — I6529 Occlusion and stenosis of unspecified carotid artery: Secondary | ICD-10-CM | POA: Insufficient documentation

## 2016-04-22 NOTE — Patient Instructions (Signed)

## 2016-04-22 NOTE — Assessment & Plan Note (Signed)
lipid control important in reducing the progression of atherosclerotic disease. Continue statin therapy  

## 2016-04-22 NOTE — Progress Notes (Signed)
Patient ID: Blake [name], male   DOB: 20-Feb-1946, 71 y.o.   MRN: [id number]  Chief Complaint  Patient presents with  . New Patient (Initial Visit)    HPI Blake [name] is a 71 y.o. male.  I am asked to see the patient by Dr. Doy Hutching for evaluation of carotid disease.  The patient reports Syncope during a football game a couple of months ago. He was treated with hydration and told this was likely secondary to dehydration. He reports no focal neurologic symptoms. Specifically, the patient denies amaurosis fugax, speech or swallowing difficulties, or arm or leg weakness or numbness. He has a previous history of coronary disease and had a coronary stent placement some years ago. He was on aspirin and Plavix after this, and has been maintained on aspirin now for many years. He had a carotid ultrasound performed which I have reviewed. This demonstrates velocities that would be consistent with mild, less than 50% carotid artery stenosis on the right and velocities that would fall on the lower end of the 50-69% range on the left.   Past Medical History:  Diagnosis Date  . GERD (gastroesophageal reflux disease)   . Hyperlipidemia   . Hypertension     Past Surgical History:  Procedure Laterality Date  . COLONOSCOPY  12/08/2003  . PARTIAL HIP ARTHROPLASTY  2014  . PORT-A-CATH REMOVAL    . TONGUE BIOPSY  2006  . TONSILLECTOMY    . UPPER GI ENDOSCOPY  01/25/14   multiple gastric polyps    Family History  Problem Relation Age of Onset  . Diabetes Mother   . Lung cancer Father   No bleeding disorders, clotting disorders, or porphyrias  Social History Social History  Substance Use Topics  . Smoking status: Former Smoker    Packs/day: 1.00    Years: 30.00    Types: Cigarettes  . Smokeless tobacco: Never Used  . Alcohol use Yes  No IV drug use. Married  Allergies  Allergen Reactions  . Sulfa Antibiotics Rash    Current Outpatient Prescriptions  Medication Sig Dispense Refill    . aspirin 81 MG tablet Take 81 mg by mouth daily.    [name] Kitchen atorvastatin (LIPITOR) 80 MG tablet   2  . co-enzyme Q-10 30 MG capsule Take 30 mg by mouth daily.    [name] Kitchen lisinopril (PRINIVIL,ZESTRIL) 20 MG tablet Take 20 mg by mouth daily.     . metoprolol succinate (TOPROL-XL) 50 MG 24 hr tablet Take 50 mg by mouth daily.     . Omega-3 Fatty Acids (FISH OIL) 1000 MG CAPS Take by mouth daily.    [name] Kitchen omeprazole (PRILOSEC) 20 MG capsule Take 20 mg by mouth daily.     . ranitidine (ZANTAC) 300 MG tablet Take by mouth.    . tamsulosin (FLOMAX) 0.4 MG CAPS capsule Take by mouth.    [name] Kitchen b complex vitamins tablet Take 1 tablet by mouth daily.    . Blood Glucose Monitoring Suppl (ONE TOUCH ULTRA SYSTEM KIT) w/Device KIT     . Multiple Vitamin tablet     . polyethylene glycol powder (GLYCOLAX/MIRALAX) powder 255 grams one bottle for colonoscopy prep (Patient not taking: Reported on 04/22/2016) 255 g 0  . pravastatin (PRAVACHOL) 80 MG tablet Take by mouth.     No current facility-administered medications for this visit.       REVIEW OF SYSTEMS (Negative unless checked)  Constitutional: _0 Weight loss  _1 Fever  _2 Chills Cardiac: _3 Chest pain   _4   Chest pressure   _0 Palpitations   _1 Shortness of breath when laying flat   _2 Shortness of breath at rest   _3 Shortness of breath with exertion. Vascular:  _4 Pain in legs with walking   _5 Pain in legs at rest   _6 Pain in legs when laying flat   _7 Claudication   _8 Pain in feet when walking  _9 Pain in feet at rest  _10 Pain in feet when laying flat   _11 History of DVT   _12 Phlebitis   _13 Swelling in legs   _14 Varicose veins   _15 Non-healing ulcers Pulmonary:   _16 Uses home oxygen   _17 Productive cough   _18 Hemoptysis   _19 Wheeze  _20 COPD   _21 Asthma Neurologic:  _22 Dizziness  _23 Blackouts   _24 Seizures   _25 History of stroke   _26 History of TIA  _27 Aphasia   _28 Temporary blindness   _29 Dysphagia   _30 Weakness or numbness in arms   _31 Weakness or numbness in legs Musculoskeletal:  _32 Arthritis    _33 Joint swelling   _34 Joint pain   _35 Low back pain Hematologic:  _36 Easy bruising  _37 Easy bleeding   _38 Hypercoagulable state   _39 Anemic  _40 Hepatitis Gastrointestinal:  _41 Blood in stool   _42 Vomiting blood  _43 Gastroesophageal reflux/heartburn   _44 Abdominal pain Genitourinary:  _45 Chronic kidney disease   _46 Difficult urination  _47 Frequent urination  _48 Burning with urination   _49 Hematuria Skin:  _50 Rashes   _51 Ulcers   _52 Wounds Psychological:  _53 History of anxiety   _54  History of major depression.    Physical Exam BP (!) 149/77   Pulse 96   Resp 16   Ht _55  (1.651 m)   Wt 183 lb 9.6 oz (83.3 kg)   BMI 30.55 kg/m  Gen:  WD/WN, NAD Head: [place]/AT, No temporalis wasting. Prominent temp pulse not noted. Ear/Nose/Throat: Hearing grossly intact, nares w/o erythema or drainage, oropharynx w/o Erythema/Exudate Eyes: Conjunctiva clear, sclera non-icteric  Neck: trachea midline.  No bruit or JVD.  Pulmonary:  Good air movement, clear to auscultation bilaterally.  Cardiac: RRR, normal S1, S2, no Murmurs, rubs or gallops. Vascular:  Vessel Right Left  Radial Palpable Palpable                                   Gastrointestinal: soft, non-tender/non-distended. No guarding/reflex. No masses, surgical incisions, or scars. Musculoskeletal: M/S 5/5 throughout.  Extremities without ischemic changes.  No deformity or atrophy. no edema. Neurologic: Sensation grossly intact in extremities.  Symmetrical.  Speech is fluent. Motor exam as listed above. Psychiatric: Judgment intact, Mood & affect appropriate for pt's clinical situation. Dermatologic: No rashes or ulcers noted.  No cellulitis or open wounds. Lymph : No Cervical, Axillary, or Inguinal lymphadenopathy.   Radiology No results found.  Labs No results found for this or any previous visit (from the past 2160 hour(s)).  Assessment/Plan:  HLD (hyperlipidemia) lipid control important in reducing the progression of atherosclerotic disease.  Continue statin therapy   Diabetes mellitus ([place]) blood glucose control important in reducing the progression of atherosclerotic disease. Also, involved in wound healing. On appropriate medications.   Essential (primary) hypertension blood pressure control important in reducing the progression of atherosclerotic disease. On appropriate oral medications.   Carotid stenosis He had a carotid ultrasound performed which I have reviewed. This demonstrates velocities that would be consistent with mild, less than 50% carotid artery stenosis on the right and velocities that would fall on the lower end of the 50-69% range on the left. We had a long discussion today  about the pathophysiology and natural history of carotid disease. At this degree of stenosis, he would not benefit from surgery or endovascular therapy. Medical management alone is appropriate. He is going to continue aspirin and a statin agent. We discussed the addition of Plavix today is a possibility, and as a compromise were going to use a 325 mg aspirin at this point. We will plan to see him back in 6 months with a duplex for follow-up of his carotid disease. He will contact our office with any problems in the interim.      Leotis Pain 04/22/2016, 4:38 PM   This note was created with Dragon medical transcription system.  Any errors from dictation are unintentional.

## 2016-04-22 NOTE — Assessment & Plan Note (Signed)
He had a carotid ultrasound performed which I have reviewed. This demonstrates velocities that would be consistent with mild, less than 50% carotid artery stenosis on the right and velocities that would fall on the lower end of the 50-69% range on the left. We had a long discussion today about the pathophysiology and natural history of carotid disease. At this degree of stenosis, he would not benefit from surgery or endovascular therapy. Medical management alone is appropriate. He is going to continue aspirin and a statin agent. We discussed the addition of Plavix today is a possibility, and as a compromise were going to use a 325 mg aspirin at this point. We will plan to see him back in 6 months with a duplex for follow-up of his carotid disease. He will contact our office with any problems in the interim.

## 2016-04-22 NOTE — Assessment & Plan Note (Signed)
blood glucose control important in reducing the progression of atherosclerotic disease. Also, involved in wound healing. On appropriate medications.  

## 2016-04-22 NOTE — Assessment & Plan Note (Signed)
blood pressure control important in reducing the progression of atherosclerotic disease. On appropriate oral medications.  

## 2016-06-05 DIAGNOSIS — I6523 Occlusion and stenosis of bilateral carotid arteries: Secondary | ICD-10-CM | POA: Insufficient documentation

## 2016-07-01 DIAGNOSIS — M19011 Primary osteoarthritis, right shoulder: Secondary | ICD-10-CM | POA: Insufficient documentation

## 2016-10-31 ENCOUNTER — Ambulatory Visit (INDEPENDENT_AMBULATORY_CARE_PROVIDER_SITE_OTHER): Payer: Medicare Other | Admitting: Vascular Surgery

## 2016-10-31 ENCOUNTER — Encounter (INDEPENDENT_AMBULATORY_CARE_PROVIDER_SITE_OTHER): Payer: Self-pay | Admitting: Vascular Surgery

## 2016-10-31 ENCOUNTER — Ambulatory Visit (INDEPENDENT_AMBULATORY_CARE_PROVIDER_SITE_OTHER): Payer: Medicare Other

## 2016-10-31 VITALS — BP 157/85 | HR 77 | Resp 15 | Ht 65.0 in | Wt 178.0 lb

## 2016-10-31 DIAGNOSIS — I10 Essential (primary) hypertension: Secondary | ICD-10-CM

## 2016-10-31 DIAGNOSIS — I6523 Occlusion and stenosis of bilateral carotid arteries: Secondary | ICD-10-CM

## 2016-10-31 DIAGNOSIS — E785 Hyperlipidemia, unspecified: Secondary | ICD-10-CM | POA: Diagnosis not present

## 2016-10-31 NOTE — Assessment & Plan Note (Signed)
blood pressure control important in reducing the progression of atherosclerotic disease. On appropriate oral medications.  

## 2016-10-31 NOTE — Patient Instructions (Signed)
Carotid Artery Disease The carotid arteries are arteries on both sides of the neck. They carry blood to the brain. Carotid artery disease is when the arteries get smaller (narrow) or get blocked. If these arteries get smaller or get blocked, you are more likely to have a stroke or warning stroke (transient ischemic attack). Follow these instructions at home:  Take medicines as told by your doctor. Make sure you understand all your medicine instructions. Do not stop your medicines without talking to your doctor first.  Follow your doctor's diet instructions. It is important to eat a healthy diet that includes plenty of: ? Fresh fruits. ? Vegetables. ? Lean meats.  Avoid: ? High-fat foods. ? High-sodium foods. ? Foods that are fried, overly processed, or have poor nutritional value.  Stay a healthy weight.  Stay active. Get at least 30 minutes of activity every day.  Do not smoke.  Limit alcohol use to: ? No more than 2 drinks a day for men. ? No more than 1 drink a day for women who are not pregnant.  Do not use illegal drugs.  Keep all doctor visits as told. Get help right away if:  You have sudden weakness or loss of feeling (numbness) on one side of the body, such as the face, arm, or leg.  You have sudden confusion.  You have trouble speaking (aphasia) or understanding.  You have sudden trouble seeing out of one or both eyes.  You have sudden trouble walking.  You have dizziness or feel like you might pass out (faint).  You have a loss of balance or your movements are not steady (uncoordinated).  You have a sudden, severe headache with no known cause.  You have trouble swallowing (dysphagia). Call your local emergency services (911 in [place] Do notdrive yourself to the clinic or hospital. This information is not intended to replace advice given to you by your health care provider. Make sure you discuss any questions you have with your health care  provider. Document Released: 03/03/2012 Document Revised: 08/23/2015 Document Reviewed: 09/15/2012 Elsevier Interactive Patient Education  2018 [employer]  

## 2016-10-31 NOTE — Assessment & Plan Note (Signed)
lipid control important in reducing the progression of atherosclerotic disease. Continue statin therapy  

## 2016-10-31 NOTE — Progress Notes (Signed)
MRN : [id number]  Blake [name] is a 71 y.o. (10-08-1945) male who presents with chief complaint of  Chief Complaint  Patient presents with  . Carotid    6 month follow up u/s  .  History of Present Illness: Patient returns in follow-up of carotid disease. He is doing well without any major issues other than his wife's recent recurrent cancer diagnosis. He denies focal neurologic symptoms. Specifically, the patient denies amaurosis fugax, speech or swallowing difficulties, or arm or leg weakness or numbness His carotid duplex today reveals stable 1-39% right ICA stenosis and stable 40-59% left ICA stenosis.  Current Outpatient Prescriptions  Medication Sig Dispense Refill  . Aspirin Buf,CaCarb-MgCarb-MgO, (BUFFERED ASPIRIN) 325 MG TABS Take by mouth.    [name] Kitchen atorvastatin (LIPITOR) 80 MG tablet   2  . b complex vitamins tablet Take 1 tablet by mouth daily.    . Blood Glucose Monitoring Suppl (ONE TOUCH ULTRA SYSTEM KIT) w/Device KIT     . co-enzyme Q-10 30 MG capsule Take 30 mg by mouth daily.    [name] Kitchen lisinopril (PRINIVIL,ZESTRIL) 2.5 MG tablet     . metoprolol succinate (TOPROL-XL) 50 MG 24 hr tablet Take 50 mg by mouth daily.     . Multiple Vitamin tablet     . omega-3 acid ethyl esters (LOVAZA) 1 g capsule Take by mouth.    [name] Kitchen omeprazole (PRILOSEC) 40 MG capsule   3  . polyethylene glycol powder (GLYCOLAX/MIRALAX) powder 255 grams one bottle for colonoscopy prep 255 g 0  . pravastatin (PRAVACHOL) 80 MG tablet Take by mouth.    . tamsulosin (FLOMAX) 0.4 MG CAPS capsule TAKE 1 CAPSULE BY MOUTH ONCE DAILY. TAKE 30 MINUTES AFTER SAME MEAL EACH DAY.    [name] Kitchen lisinopril (PRINIVIL,ZESTRIL) 20 MG tablet Take 20 mg by mouth daily.     . ranitidine (ZANTAC) 300 MG tablet Take by mouth.     No current facility-administered medications for this visit.     Past Medical History:  Diagnosis Date  . GERD (gastroesophageal reflux disease)   . Hyperlipidemia   . Hypertension     Past Surgical  History:  Procedure Laterality Date  . COLONOSCOPY  12/08/2003  . PARTIAL HIP ARTHROPLASTY  2014  . PORT-A-CATH REMOVAL    . TONGUE BIOPSY  2006  . TONSILLECTOMY    . UPPER GI ENDOSCOPY  01/25/14   multiple gastric polyps    Social History Social History  Substance Use Topics  . Smoking status: Former Smoker    Packs/day: 1.00    Years: 30.00    Types: Cigarettes  . Smokeless tobacco: Never Used  . Alcohol use Yes    Family History Family History  Problem Relation Age of Onset  . Diabetes Mother   . Lung cancer Father     Allergies  Allergen Reactions  . Sulfa Antibiotics Rash     REVIEW OF SYSTEMS (Negative unless checked)  Constitutional: '[]' Weight loss  '[]' Fever  '[]' Chills Cardiac: '[]' Chest pain   '[]' Chest pressure   '[]' Palpitations   '[]' Shortness of breath when laying flat   '[]' Shortness of breath at rest   '[]' Shortness of breath with exertion. Vascular:  '[]' Pain in legs with walking   '[]' Pain in legs at rest   '[]' Pain in legs when laying flat   '[]' Claudication   '[]' Pain in feet when walking  '[]' Pain in feet at rest  '[]' Pain in feet when laying flat   '[]' History of DVT   '[]' Phlebitis   '[]' Swelling  in legs   '[]' Varicose veins   '[]' Non-healing ulcers Pulmonary:   '[]' Uses home oxygen   '[]' Productive cough   '[]' Hemoptysis   '[]' Wheeze  '[]' COPD   '[]' Asthma Neurologic:  '[]' Dizziness  '[]' Blackouts   '[]' Seizures   '[]' History of stroke   '[]' History of TIA  '[]' Aphasia   '[]' Temporary blindness   '[]' Dysphagia   '[]' Weakness or numbness in arms   '[]' Weakness or numbness in legs Musculoskeletal:  '[]' Arthritis   '[]' Joint swelling   '[]' Joint pain   '[]' Low back pain Hematologic:  '[]' Easy bruising  '[]' Easy bleeding   '[]' Hypercoagulable state   '[]' Anemic  '[]' Hepatitis Gastrointestinal:  '[]' Blood in stool   '[]' Vomiting blood  '[]' Gastroesophageal reflux/heartburn   '[]' Difficulty swallowing. Genitourinary:  '[]' Chronic kidney disease   '[]' Difficult urination  '[]' Frequent urination  '[]' Burning with urination   '[]' Blood in urine Skin:  '[]' Rashes    '[]' Ulcers   '[]' Wounds Psychological:  '[]' History of anxiety   '[]'  History of major depression.  Physical Examination  Vitals:   10/31/16 1135 10/31/16 1136  BP: (!) 154/88 (!) 157/85  Pulse: 82 77  Resp: 15   Weight: 178 lb (80.7 kg)   Height: '5\' 5"'  (1.651 m)    Body mass index is 29.62 kg/m. Gen:  WD/WN, NAD. Appears younger than stated age. Head: [place]/AT, No temporalis wasting. Ear/Nose/Throat: Hearing grossly intact, nares w/o erythema or drainage, trachea midline Eyes: Conjunctiva clear. Sclera non-icteric Neck: Supple.  No bruit or JVD.  Pulmonary:  Good air movement, equal and clear to auscultation bilaterally.  Cardiac: RRR, normal S1, S2, no Murmurs, rubs or gallops. Vascular:  Vessel Right Left  Radial Palpable Palpable                                    Musculoskeletal: M/S 5/5 throughout.  No deformity or atrophy.  Neurologic: CN 2-12 intact. Sensation grossly intact in extremities.  Symmetrical.  Speech is fluent. Motor exam as listed above. Psychiatric: Judgment intact, Mood & affect appropriate for pt's clinical situation. Dermatologic: No rashes or ulcers noted.  No cellulitis or open wounds.      CBC Lab Results  Component Value Date   WBC 8.5 01/12/2013   HGB 11.8 (L) 01/26/2013   HCT 47.5 01/12/2013   MCV 90 01/12/2013   PLT 220 01/26/2013    BMET    Component Value Date/Time   NA 130 (L) 01/26/2013 0505   K 4.6 01/26/2013 0505   CL 101 01/26/2013 0505   CO2 26 01/26/2013 0505   GLUCOSE 138 (H) 01/26/2013 0505   BUN 19 (H) 01/26/2013 0505   CREATININE 1.18 01/26/2013 0505   CALCIUM 8.3 (L) 01/26/2013 0505   GFRNONAA >60 01/26/2013 0505   GFRAA >60 01/26/2013 0505   CrCl cannot be calculated (Patient's most recent lab result is older than the maximum 21 days allowed.).  COAG Lab Results  Component Value Date   INR 0.9 01/12/2013    Radiology No results found.    Assessment/Plan HLD (hyperlipidemia) lipid control important  in reducing the progression of atherosclerotic disease. Continue statin therapy   Essential (primary) hypertension blood pressure control important in reducing the progression of atherosclerotic disease. On appropriate oral medications.   Carotid stenosis His carotid duplex today reveals stable 1-39% right ICA stenosis and stable 40-59% left ICA stenosis. Continue aspirin therapy as well as Lipitor. Recheck in 1 year with carotid duplex.    Leotis Pain, MD  10/31/2016  2:13 PM    This note was created with Dragon medical transcription system.  Any errors from dictation are purely unintentional

## 2016-10-31 NOTE — Assessment & Plan Note (Signed)
His carotid duplex today reveals stable 1-39% right ICA stenosis and stable 40-59% left ICA stenosis. Continue aspirin therapy as well as Lipitor. Recheck in 1 year with carotid duplex.

## 2017-03-20 ENCOUNTER — Other Ambulatory Visit: Payer: Self-pay | Admitting: Internal Medicine

## 2017-03-20 DIAGNOSIS — M5137 Other intervertebral disc degeneration, lumbosacral region: Secondary | ICD-10-CM

## 2017-04-02 ENCOUNTER — Ambulatory Visit
Admission: RE | Admit: 2017-04-02 | Discharge: 2017-04-02 | Disposition: A | Discharge status: Home / Self Care | Payer: Medicare Other | Source: Ambulatory Visit | Attending: Internal Medicine | Admitting: Internal Medicine

## 2017-04-02 DIAGNOSIS — M48061 Spinal stenosis, lumbar region without neurogenic claudication: Secondary | ICD-10-CM | POA: Diagnosis not present

## 2017-04-02 DIAGNOSIS — M5137 Other intervertebral disc degeneration, lumbosacral region: Secondary | ICD-10-CM | POA: Insufficient documentation

## 2017-04-02 DIAGNOSIS — M5136 Other intervertebral disc degeneration, lumbar region: Secondary | ICD-10-CM | POA: Insufficient documentation

## 2017-04-02 DIAGNOSIS — M47817 Spondylosis without myelopathy or radiculopathy, lumbosacral region: Secondary | ICD-10-CM | POA: Insufficient documentation

## 2017-04-27 ENCOUNTER — Encounter: Payer: Self-pay | Admitting: Student in an Organized Health Care Education/Training Program

## 2017-04-27 ENCOUNTER — Other Ambulatory Visit: Payer: Self-pay

## 2017-04-27 ENCOUNTER — Ambulatory Visit
Admission: RE | Admit: 2017-04-27 | Discharge: 2017-04-27 | Disposition: A | Discharge status: Home / Self Care | Payer: Medicare Other | Source: Ambulatory Visit | Attending: Student in an Organized Health Care Education/Training Program | Admitting: Student in an Organized Health Care Education/Training Program

## 2017-04-27 ENCOUNTER — Ambulatory Visit (HOSPITAL_BASED_OUTPATIENT_CLINIC_OR_DEPARTMENT_OTHER): Payer: Medicare Other | Admitting: Student in an Organized Health Care Education/Training Program

## 2017-04-27 VITALS — BP 178/93 | HR 78 | Temp 98.0°F | Resp 18 | Ht 66.0 in | Wt 180.0 lb

## 2017-04-27 DIAGNOSIS — M5416 Radiculopathy, lumbar region: Secondary | ICD-10-CM | POA: Diagnosis not present

## 2017-04-27 DIAGNOSIS — M48062 Spinal stenosis, lumbar region with neurogenic claudication: Secondary | ICD-10-CM | POA: Insufficient documentation

## 2017-04-27 DIAGNOSIS — M5136 Other intervertebral disc degeneration, lumbar region: Secondary | ICD-10-CM

## 2017-04-27 DIAGNOSIS — M5116 Intervertebral disc disorders with radiculopathy, lumbar region: Secondary | ICD-10-CM | POA: Diagnosis present

## 2017-04-27 DIAGNOSIS — Z96649 Presence of unspecified artificial hip joint: Secondary | ICD-10-CM | POA: Diagnosis not present

## 2017-04-27 DIAGNOSIS — Z9889 Other specified postprocedural states: Secondary | ICD-10-CM | POA: Diagnosis not present

## 2017-04-27 MED ORDER — DEXAMETHASONE SODIUM PHOSPHATE 10 MG/ML IJ SOLN
10.0000 mg | Freq: Once | INTRAMUSCULAR | Status: AC
  Administered 2017-04-27: 10 mg

## 2017-04-27 MED ORDER — SODIUM CHLORIDE 0.9% FLUSH
2.0000 mL | Freq: Once | INTRAVENOUS | Status: AC
  Administered 2017-04-27: 10 mL

## 2017-04-27 MED ORDER — LIDOCAINE HCL (PF) 1 % IJ SOLN
INTRAMUSCULAR | Status: AC
  Filled 2017-04-27: qty 5

## 2017-04-27 MED ORDER — SODIUM CHLORIDE 0.9 % IJ SOLN
INTRAMUSCULAR | Status: AC
  Filled 2017-04-27: qty 10

## 2017-04-27 MED ORDER — IOPAMIDOL (ISOVUE-M 200) INJECTION 41%
INTRAMUSCULAR | Status: AC
  Filled 2017-04-27: qty 10

## 2017-04-27 MED ORDER — DEXAMETHASONE SODIUM PHOSPHATE 10 MG/ML IJ SOLN
INTRAMUSCULAR | Status: AC
  Filled 2017-04-27: qty 1

## 2017-04-27 MED ORDER — LIDOCAINE HCL 1 % IJ SOLN
10.0000 mL | Freq: Once | INTRAMUSCULAR | Status: AC
  Administered 2017-04-27: 5 mL
  Filled 2017-04-27: qty 10

## 2017-04-27 MED ORDER — ROPIVACAINE HCL 2 MG/ML IJ SOLN
INTRAMUSCULAR | Status: AC
  Filled 2017-04-27: qty 10

## 2017-04-27 MED ORDER — ROPIVACAINE HCL 2 MG/ML IJ SOLN
10.0000 mL | Freq: Once | INTRAMUSCULAR | Status: AC
  Administered 2017-04-27: 10 mL

## 2017-04-27 MED ORDER — IOPAMIDOL (ISOVUE-M 200) INJECTION 41%
10.0000 mL | Freq: Once | INTRAMUSCULAR | Status: AC
  Administered 2017-04-27: 10 mL via EPIDURAL

## 2017-04-27 NOTE — Progress Notes (Signed)
Patient's Name: Blake [name]  MRN: [id number]  Referring Provider: Meade Maw, MD  DOB: November 08, 1945  PCP: Idelle Crouch, MD  DOS: 04/27/2017  Note by: Gillis Santa, MD  Service setting: Ambulatory outpatient  Specialty: Interventional Pain Management  Location: [facility] (AMB) Pain Management Facility    Patient type: New patient ("FAST-TRACK" Evaluation)   Warning: This referral option does not include the extensive pharmacological evaluation required for [place] to take over the patient's medication management. The "Fast-Track" system is designed to bypass the new patient referral waiting list, as well as the normal patient evaluation process, in order to provide a patient in distress with a timely pain management intervention. Because the system was not designed to unfairly get a patient into our pain practice ahead of those already waiting, certain restrictions apply. By requesting a "Fast-Track" consult, the referring physician has opted to continue managing the patient's medications in order to get interventional urgent care.  Primary Reason for Visit: Interventional Pain Management Treatment. CC: Back Pain (lower)   Procedure  HPI  [name] is a 72 y.o. year old, male patient, who comes today for a  "Fast-Track" new patient evaluation, as requested by Meade Maw, MD. The patient has been made aware that this type of referral option is reserved for the Interventional Pain Management portion of our practice and completely excludes the option of medication management. His primarily concern today is the Back Pain (lower)  Pain Assessment: Location: Lower Back Radiating: both hips and back of both upper legs Onset: More than a month ago Duration: Chronic pain Quality: Dull Severity: 3 /10 (self-reported pain score)  Note: Reported level is compatible with observation.                         When using our objective Pain Scale, levels between 6 and 10/10 are said to belong in an  emergency room, as it progressively worsens from a 6/10, described as severely limiting, requiring emergency care not usually available at an outpatient pain management facility. At a 6/10 level, communication becomes difficult and requires great effort. Assistance to reach the emergency department may be required. Facial flushing and profuse sweating along with potentially dangerous increases in heart rate and blood pressure will be evident. Effect on ADL:   Timing: Intermittent Modifying factors: lying down  Onset and Duration: Gradual but worsened in December 2018. Cause of pain: Unknown Severity: Getting worse Timing: After activity or exercise Aggravating Factors: Lifiting, Motion, Walking and Walking downhill Alleviating Factors: Stretching, Lying down, Medications, Resting, Relaxation therapy and Warm showers or baths Associated Problems: Numbness, Spasms and Tingling Quality of Pain: Annoying, Intermittent, Distressing, Pressure-like, Throbbing, Tingling and Uncomfortable Previous Examinations or Tests: MRI scan, X-rays, Neurological evaluation and Chiropractic evaluation Previous Treatments: Physical Therapy, Relaxation therapy and Stretching exercises  The patient comes into the clinics today, referred to [place] for a lumbar epidural steroid injection.  Patient was originally having left lower externally pain but now endorses bilateral lower extremity pain.  He describes it as a burning tingling sensation that runs down the back of his legs and around his lateral calf.  It occasionally shoots down into his toes.  Meds   Current Outpatient Medications:  .  Aspirin Buf,CaCarb-MgCarb-MgO, (BUFFERED ASPIRIN) 325 MG TABS, Take by mouth., Disp: , Rfl:  .  atorvastatin (LIPITOR) 80 MG tablet, , Disp: , Rfl: 2 .  b complex vitamins tablet, Take 1 tablet by mouth daily., Disp: , Rfl:  .  Blood Glucose Monitoring Suppl (ONE TOUCH ULTRA SYSTEM KIT) w/Device KIT, , Disp: , Rfl:  .  co-enzyme Q-10  30 MG capsule, Take 30 mg by mouth daily., Disp: , Rfl:  .  lisinopril (PRINIVIL,ZESTRIL) 2.5 MG tablet, , Disp: , Rfl:  .  lisinopril (PRINIVIL,ZESTRIL) 20 MG tablet, Take 20 mg by mouth daily. , Disp: , Rfl:  .  metoprolol succinate (TOPROL-XL) 50 MG 24 hr tablet, Take 50 mg by mouth daily. , Disp: , Rfl:  .  Multiple Vitamin tablet, , Disp: , Rfl:  .  omega-3 acid ethyl esters (LOVAZA) 1 g capsule, Take by mouth., Disp: , Rfl:  .  omeprazole (PRILOSEC) 40 MG capsule, , Disp: , Rfl: 3 .  polyethylene glycol powder (GLYCOLAX/MIRALAX) powder, 255 grams one bottle for colonoscopy prep, Disp: 255 g, Rfl: 0 .  pravastatin (PRAVACHOL) 80 MG tablet, Take by mouth., Disp: , Rfl:  .  tamsulosin (FLOMAX) 0.4 MG CAPS capsule, TAKE 1 CAPSULE BY MOUTH ONCE DAILY. TAKE 30 MINUTES AFTER SAME MEAL EACH DAY., Disp: , Rfl:  .  ranitidine (ZANTAC) 300 MG tablet, Take by mouth., Disp: , Rfl:   Imaging Review   Lumbosacral Imaging: Lumbar MR wo contrast:  Results for orders placed during the hospital encounter of 04/02/17  MR LUMBAR SPINE WO CONTRAST   Narrative CLINICAL DATA:  Lumbar disc degeneration. Low back pain left leg pain  EXAM: MRI LUMBAR SPINE WITHOUT CONTRAST  TECHNIQUE: Multiplanar, multisequence MR imaging of the lumbar spine was performed. No intravenous contrast was administered.  COMPARISON:  None.  FINDINGS: Segmentation:  Normal segmentation.  Alignment:  Mild retrolisthesis L4-5 and L5-S1  Vertebrae: Multiple hemangiomata involving the vertebral bodies L1 through L4. Negative for fracture or mass  Conus medullaris and cauda equina: Conus extends to the L2-3 level. Conus and cauda equina appear normal.  Paraspinal and other soft tissues: Negative  Disc levels:  T12-L1:  Mild disc degeneration without stenosis  L1-2:  Disc degeneration with disc bulging.  Negative for stenosis  L2-3: Disc degeneration with diffuse bulging of the disc and endplate spurring. Mild spinal  stenosis and mild subarticular stenosis bilaterally.  L3-4: Broad-based disc protrusion and associated spurring. Bilateral facet and ligamentum flavum hypertrophy. Moderate spinal stenosis. Moderate subarticular and foraminal stenosis on the right and severe subarticular and foraminal stenosis on the left.  L4-5: Central and left-sided disc protrusion. Disc degeneration with diffuse endplate spurring. Severe spinal stenosis. Severe subarticular and foraminal stenosis on the left with impingement of the left L4 and L5 nerve roots. Moderate right subarticular and foraminal stenosis.  L5-S1: Disc degeneration and spondylosis with disc bulging and diffuse endplate spurring. Bilateral facet hypertrophy. Bilateral L5 nerve root impingement in the foramen  IMPRESSION: Mild spinal stenosis L2-3  Moderate spinal stenosis L3-4. Subarticular foraminal stenosis left greater than right  Severe spinal stenosis L4-5. Subarticular foraminal stenosis left greater than right. 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   Electronically Signed   By: Franchot Gallo M.D.   On: 04/02/2017 14:01     Complexity Note: Imaging results reviewed. Results shared with [name], using Layman's terms.                         ROS  Cardiovascular History: Daily Aspirin intake history of coronary artery disease, status post stent placement Pulmonary or Respiratory History: Shortness of breath Neurological History: No reported  neurological signs or symptoms such as seizures, abnormal skin sensations, urinary and/or fecal incontinence, being born with an abnormal open spine and/or a tethered spinal cord Review of Past Neurological Studies: No results found for this or any previous visit. Psychological-Psychiatric History: No reported psychological or psychiatric signs or symptoms such as difficulty sleeping, anxiety, depression,  delusions or hallucinations (schizophrenial), mood swings (bipolar disorders) or suicidal ideations or attempts Gastrointestinal History: Reflux or heatburn Genitourinary History: No reported renal or genitourinary signs or symptoms such as difficulty voiding or producing urine, peeing blood, non-functioning kidney, kidney stones, difficulty emptying the bladder, difficulty controlling the flow of urine, or chronic kidney disease Hematological History: No reported hematological signs or symptoms such as prolonged bleeding, low or poor functioning platelets, bruising or bleeding easily, hereditary bleeding problems, low energy levels due to low hemoglobin or being anemic Endocrine History: No reported endocrine signs or symptoms such as high or low blood sugar, rapid heart rate due to high thyroid levels, obesity or weight gain due to slow thyroid or thyroid disease Rheumatologic History: No reported rheumatological signs and symptoms such as fatigue, joint pain, tenderness, swelling, redness, heat, stiffness, decreased range of motion, with or without associated rash Musculoskeletal History: Negative for myasthenia gravis, muscular dystrophy, multiple sclerosis or malignant hyperthermia Work History: Homemaker  Allergies  [name] is allergic to sulfa antibiotics.  Laboratory Chemistry  Inflammation Markers (CRP: Acute Phase) (ESR: Chronic Phase) Lab Results  Component Value Date   ESRSEDRATE 1 01/12/2013                 Rheumatology Markers No results found for: RF, ANA, LABURIC, URICUR, LYMEIGGIGMAB, LYMEABIGMQN              Renal Function Markers Lab Results  Component Value Date   BUN 19 (H) 01/26/2013   CREATININE 1.18 01/26/2013   GFRAA >60 01/26/2013   GFRNONAA >60 01/26/2013                 Hepatic Function Markers Lab Results  Component Value Date   AST 18 08/11/2011   ALT 35 08/11/2011   ALBUMIN 3.8 08/11/2011   ALKPHOS 75 08/11/2011                  Electrolytes Lab Results  Component Value Date   NA 130 (L) 01/26/2013   K 4.6 01/26/2013   CL 101 01/26/2013   CALCIUM 8.3 (L) 01/26/2013                 Neuropathy Markers Lab Results  Component Value Date   HGBA1C 6.9 (H) 01/25/2013                 Bone Pathology Markers No results found for: VD25OH, [id number], QP6195KD3, [id number], 25OHVITD1, 25OHVITD2, 25OHVITD3, TESTOFREE, TESTOSTERONE               Coagulation Parameters Lab Results  Component Value Date   INR 0.9 01/12/2013   LABPROT 12.0 01/12/2013   APTT 29.5 01/12/2013   PLT 220 01/26/2013                 Cardiovascular Markers Lab Results  Component Value Date   CKTOTAL 483 (H) 01/25/2013   CKMB 4.7 (H) 01/25/2013   TROPONINI < 0.02 01/25/2013   HGB 11.8 (L) 01/26/2013   HCT 47.5 01/12/2013                 CA Markers No results found for: CEA, CA125, LABCA2  Note: Lab results reviewed.  [place]  Drug: [name]  reports that he does not use drugs. Alcohol:  reports that he drinks alcohol. Tobacco:  reports that he has quit smoking. His smoking use included cigarettes. He has a 30.00 pack-year smoking history. he has never used smokeless tobacco. Medical:  has a past medical history of GERD (gastroesophageal reflux disease), Hyperlipidemia, and Hypertension. Family: family history includes Diabetes in his mother; Lung cancer in his father.  Past Surgical History:  Procedure Laterality Date  . COLONOSCOPY  12/08/2003  . PARTIAL HIP ARTHROPLASTY  2014  . PORT-A-CATH REMOVAL    . TONGUE BIOPSY  2006  . TONSILLECTOMY    . UPPER GI ENDOSCOPY  01/25/14   multiple gastric polyps   Active Ambulatory Problems    Diagnosis Date Noted  . Allergic rhinitis 08/24/2015  . 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 Diabetes mellitus ([place]) 08/24/2015  . Acid reflux 04/01/2003  . Essential (primary) hypertension 04/01/2003  . Family history of cancer of digestive system 10/20/2006  .  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 HLD (hyperlipidemia) 08/24/2015  . 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 Carotid stenosis 04/22/2016   Resolved Ambulatory Problems    Diagnosis Date Noted  . No Resolved Ambulatory Problems   Past Medical History:  Diagnosis Date  . GERD (gastroesophageal reflux disease)   . Hyperlipidemia   . Hypertension    Constitutional Exam  General appearance: Well nourished, well developed, and well hydrated. In no apparent acute distress Vitals:   04/27/17 1118 04/27/17 1123 04/27/17 1127 04/27/17 1134  BP: (!) 160/90 (!) 153/70 (!) 149/75 (!) 178/93  Pulse:      Resp: '18 18 19 18  ' Temp:      TempSrc:      SpO2: 99% 97% 95% 99%  Weight:      Height:       BMI Assessment: Estimated body mass index is 29.05 kg/m as calculated from the following:   Height as of this encounter: '5\' 6"'  (1.676 m).   Weight as of this encounter: 180 lb (81.6 kg).  BMI interpretation table: BMI level Category Range association with higher incidence of chronic pain  <18 kg/m2 Underweight   18.5-24.9 kg/m2 Ideal body weight   25-29.9 kg/m2 Overweight Increased incidence by 20%  30-34.9 kg/m2 Obese (Class I) Increased incidence by 68%  35-39.9 kg/m2 Severe obesity (Class II) Increased incidence by 136%  >40 kg/m2 Extreme obesity (Class III) Increased incidence by 254%   BMI Readings from Last 4 Encounters:  04/27/17 29.05 kg/m  10/31/16 29.62 kg/m  04/22/16 30.55 kg/m  08/17/13 30.18 kg/m   Wt Readings from Last 4 Encounters:  04/27/17 180 lb (81.6 kg)  10/31/16 178 lb (80.7 kg)  04/22/16 183 lb 9.6 oz (83.3 kg)  08/17/13 187 lb (84.8 kg)  Psych/Mental status: Alert, oriented x 3 (person, place, & time)       Eyes: PERLA Respiratory: No evidence of acute respiratory distress  Lumbar Spine Area Exam  Skin & Axial Inspection: No masses, redness, or swelling Alignment: Symmetrical Functional ROM: Unrestricted ROM      Stability: No  instability detected Muscle Tone/Strength: Functionally intact. No obvious neuro-muscular anomalies detected. Sensory (Neurological): Unimpaired Palpation: Complains of area being tender to palpation       Provocative Tests: Lumbar Hyperextension and rotation test: Positive bilaterally for facet joint pain. Lumbar Lateral bending test: Positive ipsilateral radicular pain, bilaterally. Positive for bilateral foraminal stenosis. Patrick's Maneuver:  evaluation deferred today                    Gait & Posture Assessment  Ambulation: Unassisted Gait: Relatively normal for age and body habitus Posture: WNL   Lower Extremity Exam    Side: Right lower extremity  Side: Left lower extremity  Skin & Extremity Inspection: Skin color, temperature, and hair growth are WNL. No peripheral edema or cyanosis. No masses, redness, swelling, asymmetry, or associated skin lesions. No contractures.  Skin & Extremity Inspection: Skin color, temperature, and hair growth are WNL. No peripheral edema or cyanosis. No masses, redness, swelling, asymmetry, or associated skin lesions. No contractures.  Functional ROM: Unrestricted ROM          Functional ROM: Unrestricted ROM          Muscle Tone/Strength: Functionally intact. No obvious neuro-muscular anomalies detected.  Muscle Tone/Strength: Functionally intact. No obvious neuro-muscular anomalies detected.  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  Patient is here for lumbar epidural steroid injection.  Patient is only on baby aspirin.  Risks and benefits of procedure were discussed and patient would like to proceed.  All  questions and concerns were answered.  Please see procedure note.

## 2017-04-27 NOTE — Progress Notes (Signed)
Patient's Name: Blake [name]  MRN: [id number]  Referring Provider: Meade Maw, MD  DOB: 02/20/1946  PCP: Idelle Crouch, MD  DOS: 04/27/2017  Note by: Gillis Santa, MD  Service setting: Ambulatory outpatient  Specialty: Interventional Pain Management  Patient type: Established  Location: [facility] (AMB) Pain Management Facility  Visit type: Interventional Procedure   Primary Reason for Visit: Interventional Pain Management Treatment. CC: Back Pain (lower)  Procedure:  Anesthesia, Analgesia, Anxiolysis:  Type: Therapeutic Inter-Laminar Epidural Steroid Injection          Region: Lumbar Level: L4-5 Level. Laterality: Midline         Type: Local Anesthesia Local Anesthetic: Lidocaine 1% Route: Infiltration ([place]/[facility]) IV Access: Declined Sedation: Meaningful verbal contact was maintained at all times during the procedure  Indication(s): Analgesia and Anxiety   Indications: 1. Lumbar radiculopathy   2. Lumbar degenerative disc disease   3. Spinal stenosis, lumbar region, with neurogenic claudication    Pain Score: Pre-procedure: 3 /10 Post-procedure: 0-No pain/10  Pre-op Assessment:  Blake [name] is a 72 y.o. (year old), male patient, seen today for interventional treatment. He  has a past surgical history that includes Partial hip arthroplasty (2014); Tongue Biopsy (2006); Port-a-cath removal; Colonoscopy (12/08/2003); Tonsillectomy; and Upper gi endoscopy (01/25/14). Blake [name] has a current medication list which includes the following prescription(s): buffered aspirin, atorvastatin, b complex vitamins, one touch ultra system kit, co-enzyme q-10, lisinopril, lisinopril, metoprolol succinate, multiple vitamin, omega-3 acid ethyl esters, omeprazole, polyethylene glycol powder, pravastatin, tamsulosin, and ranitidine. His primarily concern today is the Back Pain (lower)  Initial Vital Signs:  Pulse Rate: 78 Temp: 98 F (36.7 C) ECG Heart Rate: 96 Resp: 16 BP: (!) 174/87 SpO2: 98  % ETCO2:    BMI: Estimated body mass index is 29.05 kg/m as calculated from the following:   Height as of this encounter: '5\' 6"'  (1.676 m).   Weight as of this encounter: 180 lb (81.6 kg).  Risk Assessment: Allergies: Reviewed. He is allergic to sulfa antibiotics.  Allergy Precautions: None required Coagulopathies: Reviewed. None identified.  Blood-thinner therapy: None at this time Active Infection(s): Reviewed. None identified. Blake [name] is afebrile  Site Confirmation: Blake [name] was asked to confirm the procedure and laterality before marking the site Procedure checklist: Completed Consent: Before the procedure and under the influence of no sedative(s), amnesic(s), or anxiolytics, the patient was informed of the treatment options, risks and possible complications. To fulfill our ethical and legal obligations, as recommended by the American Medical Association's Code of Ethics, I have informed the patient of my clinical impression; the nature and purpose of the treatment or procedure; the risks, benefits, and possible complications of the intervention; the alternatives, including doing nothing; the risk(s) and benefit(s) of the alternative treatment(s) or procedure(s); and the risk(s) and benefit(s) of doing nothing. The patient was provided information about the general risks and possible complications associated with the procedure. These may include, but are not limited to: failure to achieve desired goals, infection, bleeding, organ or nerve damage, allergic reactions, paralysis, and death. In addition, the patient was informed of those risks and complications associated to Spine-related procedures, such as failure to decrease pain; infection (i.e.: Meningitis, epidural or intraspinal abscess); bleeding (i.e.: epidural hematoma, subarachnoid hemorrhage, or any other type of intraspinal or peri-dural bleeding); organ or nerve damage (i.e.: Any type of peripheral nerve, nerve root, or spinal cord  injury) with subsequent damage to sensory, motor, and/or autonomic systems, resulting in permanent pain, numbness, and/or weakness  of one or several areas of the body; allergic reactions; (i.e.: anaphylactic reaction); and/or death. Furthermore, the patient was informed of those risks and complications associated with the medications. These include, but are not limited to: allergic reactions (i.e.: anaphylactic or anaphylactoid reaction(s)); adrenal axis suppression; blood sugar elevation that in diabetics may result in ketoacidosis or comma; water retention that in patients with history of congestive heart failure may result in shortness of breath, pulmonary edema, and decompensation with resultant heart failure; weight gain; swelling or edema; medication-induced neural toxicity; particulate matter embolism and blood vessel occlusion with resultant organ, and/or nervous system infarction; and/or aseptic necrosis of one or more joints. Finally, the patient was informed that Medicine is not an exact science; therefore, there is also the possibility of unforeseen or unpredictable risks and/or possible complications that may result in a catastrophic outcome. The patient indicated having understood very clearly. We have given the patient no guarantees and we have made no promises. Enough time was given to the patient to ask questions, all of which were answered to the patient's satisfaction. Blake [name] has indicated that he wanted to continue with the procedure. Attestation: I, the ordering provider, attest that I have discussed with the patient the benefits, risks, side-effects, alternatives, likelihood of achieving goals, and potential problems during recovery for the procedure that I have provided informed consent. Date: 04/27/2017; Time: 2:00 PM  Pre-Procedure Preparation:  Monitoring: As per clinic protocol. Respiration, ETCO2, SpO2, BP, heart rate and rhythm monitor placed and checked for adequate  function Safety Precautions: Patient was assessed for positional comfort and pressure points before starting the procedure. Time-out: I initiated and conducted the "Time-out" before starting the procedure, as per protocol. The patient was asked to participate by confirming the accuracy of the "Time Out" information. Verification of the correct person, site, and procedure were performed and confirmed by me, the nursing staff, and the patient. "Time-out" conducted as per Joint Commission's Universal Protocol (UP.01.01.01). "Time-out" Date & Time: 04/27/2017; 1118 hrs.  Description of Procedure Process:   Position: Prone with head of the table was raised to facilitate breathing. Target Area: The interlaminar space, initially targeting the lower laminar border of the superior vertebral body. Approach: Paramedial approach. Area Prepped: Entire Posterior Lumbar Region Prepping solution: ChloraPrep (2% chlorhexidine gluconate and 70% isopropyl alcohol) Safety Precautions: Aspiration looking for blood return was conducted prior to all injections. At no point did we inject any substances, as a needle was being advanced. No attempts were made at seeking any paresthesias. Safe injection practices and needle disposal techniques used. Medications properly checked for expiration dates. SDV (single dose vial) medications used. Description of the Procedure: Protocol guidelines were followed. The procedure needle was introduced through the skin, ipsilateral to the reported pain, and advanced to the target area. Bone was contacted and the needle walked caudad, until the lamina was cleared. The epidural space was identified using "loss-of-resistance technique" with 2-3 ml of PF-NaCl (0.9% NSS), in a 5cc LOR glass syringe. Vitals:   04/27/17 1118 04/27/17 1123 04/27/17 1127 04/27/17 1134  BP: (!) 160/90 (!) 153/70 (!) 149/75 (!) 178/93  Pulse:      Resp: '18 18 19 18  ' Temp:      TempSrc:      SpO2: 99% 97% 95% 99%   Weight:      Height:        Start Time: 1118 hrs. End Time: 1128 hrs. Materials:  Needle(s) Type: Epidural needle Gauge: 17G Length: 3.5-in Medication(s): We  administered lidocaine, ropivacaine (PF) 2 mg/mL (0.2%), dexamethasone, iopamidol, and sodium chloride flush. Please see chart orders for dosing details. 8 CC solution consisting of 6 cc of preservative-free saline, 1 cc of 0.2% ropivacaine, 1 cc of Decadron 10 mg/cc. Imaging Guidance (Spinal):  Type of Imaging Technique: Fluoroscopy Guidance (Spinal) Indication(s): Assistance in needle guidance and placement for procedures requiring needle placement in or near specific anatomical locations not easily accessible without such assistance. Exposure Time: Please see nurses notes. Contrast: Before injecting any contrast, we confirmed that the patient did not have an allergy to iodine, shellfish, or radiological contrast. Once satisfactory needle placement was completed at the desired level, radiological contrast was injected. Contrast injected under live fluoroscopy. No contrast complications. See chart for type and volume of contrast used. Fluoroscopic Guidance: I was personally present during the use of fluoroscopy. "Tunnel Vision Technique" used to obtain the best possible view of the target area. Parallax error corrected before commencing the procedure. "Direction-depth-direction" technique used to introduce the needle under continuous pulsed fluoroscopy. Once target was reached, antero-posterior, oblique, and lateral fluoroscopic projection used confirm needle placement in all planes. Images permanently stored in EMR. Interpretation: I personally interpreted the imaging intraoperatively. Adequate needle placement confirmed in multiple planes. Appropriate spread of contrast into desired area was observed. No evidence of afferent or efferent intravascular uptake. No intrathecal or subarachnoid spread observed. Permanent images saved into the  patient's record.  Antibiotic Prophylaxis:  Indication(s): None identified Antibiotic given: None  Post-operative Assessment:  Post-procedure Vital Signs:  Pulse Rate: 78 Temp: 98 F (36.7 C) ECG Heart Rate: 98 Resp: 18 BP: (!) 178/93 SpO2: 99 % ETCO2:   EBL: None Complications: No immediate post-treatment complications observed by team, or reported by patient. Note: The patient tolerated the entire procedure well. A repeat set of vitals were taken after the procedure and the patient was kept under observation following institutional policy, for this type of procedure. Post-procedural neurological assessment was performed, showing return to baseline, prior to discharge. The patient was provided with post-procedure discharge instructions, including a section on how to identify potential problems. Should any problems arise concerning this procedure, the patient was given instructions to immediately contact us, at any time, without hesitation. In any case, we plan to contact the patient by telephone for a follow-up status report regarding this interventional procedure. Comments:  No additional relevant information. 5 out of 5 strength bilateral lower extremity: Plantar flexion, dorsiflexion, knee flexion, knee extension.  Plan of Care   Imaging Orders     DG C-Arm 1-60 Min-No Report Procedure Orders    No procedure(s) ordered today    Medications ordered for procedure: Meds ordered this encounter  Medications  . lidocaine (XYLOCAINE) 1 % (with pres) injection 10 mL  . ropivacaine (PF) 2 mg/mL (0.2%) (NAROPIN) injection 10 mL  . dexamethasone (DECADRON) injection 10 mg  . iopamidol (ISOVUE-M) 41 % intrathecal injection 10 mL  . sodium chloride flush (NS) 0.9 % injection 2 mL   Medications administered: We administered lidocaine, ropivacaine (PF) 2 mg/mL (0.2%), dexamethasone, iopamidol, and sodium chloride flush.  See the medical record for exact dosing, route, and time of  administration.  New Prescriptions   No medications on file   Disposition: Discharge home  Discharge Date & Time: 04/27/2017; 1140 hrs.   Physician-requested Follow-up: Return in about 4 weeks (around 05/25/2017) for Post Procedure Evaluation.  Future Appointments  Date Time Provider [place]  06/02/2017 12:15 PM Gillis Santa, MD [facility] None  10/02/2017 10:30 AM [facility] None  10/02/2017 11:30 AM Dew, Erskine Squibb, MD [facility] None   Primary Care Physician: Idelle Crouch, MD Location: [facility] Note by: Gillis Santa, MD Date: 04/27/2017; Time: 2:01 PM  Disclaimer:  Medicine is not an exact science. The only guarantee in medicine is that nothing is guaranteed. It is important to note that the decision to proceed with this intervention was based on the information collected from the patient. The Data and conclusions were drawn from the patient's questionnaire, the interview, and the physical examination. Because the information was provided in large part by the patient, it cannot be guaranteed that it has not been purposely or unconsciously manipulated. Every effort has been made to obtain as much relevant data as possible for this evaluation. It is important to note that the conclusions that lead to this procedure are derived in large part from the available data. Always take into account that the treatment will also be dependent on availability of resources and existing treatment guidelines, considered by other Pain Management Practitioners as being common knowledge and practice, at the time of the intervention. For Medico-Legal purposes, it is also important to point out that variation in procedural techniques and pharmacological choices are the acceptable norm. The indications, contraindications, technique, and results of the above procedure should only be interpreted and judged by a Board-Certified Interventional Pain Specialist with extensive  familiarity and expertise in the same exact procedure and technique.

## 2017-04-27 NOTE — Progress Notes (Signed)
Safety precautions to be maintained throughout the outpatient stay will include: orient to surroundings, keep bed in low position, maintain call bell within reach at all times, provide assistance with transfer out of bed and ambulation.  

## 2017-05-04 ENCOUNTER — Encounter: Payer: Self-pay | Admitting: Family Medicine

## 2017-05-04 ENCOUNTER — Encounter: Payer: Self-pay | Admitting: General Surgery

## 2017-06-02 ENCOUNTER — Encounter (INDEPENDENT_AMBULATORY_CARE_PROVIDER_SITE_OTHER): Payer: Self-pay

## 2017-06-02 ENCOUNTER — Other Ambulatory Visit: Payer: Self-pay

## 2017-06-02 ENCOUNTER — Ambulatory Visit
Payer: Medicare Other | Attending: Student in an Organized Health Care Education/Training Program | Admitting: Student in an Organized Health Care Education/Training Program

## 2017-06-02 ENCOUNTER — Encounter: Payer: Self-pay | Admitting: Student in an Organized Health Care Education/Training Program

## 2017-06-02 VITALS — BP 173/92 | HR 87 | Temp 98.1°F | Resp 16 | Ht 65.0 in | Wt 180.0 lb

## 2017-06-02 DIAGNOSIS — G8929 Other chronic pain: Secondary | ICD-10-CM | POA: Diagnosis present

## 2017-06-02 DIAGNOSIS — M545 Low back pain: Secondary | ICD-10-CM | POA: Insufficient documentation

## 2017-06-02 DIAGNOSIS — Z79899 Other long term (current) drug therapy: Secondary | ICD-10-CM | POA: Insufficient documentation

## 2017-06-02 DIAGNOSIS — E785 Hyperlipidemia, unspecified: Secondary | ICD-10-CM | POA: Insufficient documentation

## 2017-06-02 DIAGNOSIS — J309 Allergic rhinitis, unspecified: Secondary | ICD-10-CM | POA: Diagnosis not present

## 2017-06-02 DIAGNOSIS — I6529 Occlusion and stenosis of unspecified carotid artery: Secondary | ICD-10-CM | POA: Insufficient documentation

## 2017-06-02 DIAGNOSIS — E119 Type 2 diabetes mellitus without complications: Secondary | ICD-10-CM | POA: Diagnosis not present

## 2017-06-02 DIAGNOSIS — M48062 Spinal stenosis, lumbar region with neurogenic claudication: Secondary | ICD-10-CM | POA: Insufficient documentation

## 2017-06-02 DIAGNOSIS — Z809 Family history of malignant neoplasm, unspecified: Secondary | ICD-10-CM | POA: Diagnosis not present

## 2017-06-02 DIAGNOSIS — Z882 Allergy status to sulfonamides status: Secondary | ICD-10-CM | POA: Insufficient documentation

## 2017-06-02 DIAGNOSIS — M5116 Intervertebral disc disorders with radiculopathy, lumbar region: Secondary | ICD-10-CM | POA: Insufficient documentation

## 2017-06-02 DIAGNOSIS — Z85810 Personal history of malignant neoplasm of tongue: Secondary | ICD-10-CM | POA: Insufficient documentation

## 2017-06-02 DIAGNOSIS — Z86010 Personal history of colonic polyps: Secondary | ICD-10-CM | POA: Insufficient documentation

## 2017-06-02 DIAGNOSIS — K219 Gastro-esophageal reflux disease without esophagitis: Secondary | ICD-10-CM | POA: Diagnosis not present

## 2017-06-02 DIAGNOSIS — M5136 Other intervertebral disc degeneration, lumbar region: Secondary | ICD-10-CM

## 2017-06-02 DIAGNOSIS — M5416 Radiculopathy, lumbar region: Secondary | ICD-10-CM

## 2017-06-02 DIAGNOSIS — Z87891 Personal history of nicotine dependence: Secondary | ICD-10-CM | POA: Insufficient documentation

## 2017-06-02 DIAGNOSIS — I10 Essential (primary) hypertension: Secondary | ICD-10-CM | POA: Insufficient documentation

## 2017-06-02 DIAGNOSIS — M51369 Other intervertebral disc degeneration, lumbar region without mention of lumbar back pain or lower extremity pain: Secondary | ICD-10-CM | POA: Insufficient documentation

## 2017-06-02 DIAGNOSIS — Z7982 Long term (current) use of aspirin: Secondary | ICD-10-CM | POA: Insufficient documentation

## 2017-06-02 NOTE — Progress Notes (Signed)
Patient's Name: NIL XIONG  MRN: [id number]  Referring Provider: Idelle Crouch, MD  DOB: 1946/01/29  PCP: Idelle Crouch, MD  DOS: 06/02/2017  Note by: Gillis Santa, MD  Service setting: Ambulatory outpatient  Specialty: Interventional Pain Management  Location: [facility] (AMB) Pain Management Facility    Patient type: Established   Primary Reason(s) for Visit: Encounter for post-procedure evaluation of chronic illness with mild to moderate exacerbation CC: Back Pain (lower)  HPI  Mr. Ausborn is a 72 y.o. year old, male patient, who comes today for a post-procedure evaluation. He has Allergic rhinitis; Deficiency, disaccharidase intestinal; Diabetes mellitus ([place]); Acid reflux; Essential (primary) hypertension; Family history of cancer of digestive system; Personal history of malignant neoplasm of tongue; HLD (hyperlipidemia); Gastric polyposis; Carotid stenosis; Lumbar radiculopathy; Lumbar degenerative disc disease; and Spinal stenosis, lumbar region, with neurogenic claudication on their problem list. His primarily concern today is the Back Pain (lower)  Pain Assessment: Location: Lower Back Radiating: moves through both hips and down the backs of both legs to both feet, not including toes Onset: More than a month ago Duration: Chronic pain Quality: Aching Severity: 5 /10 (self-reported pain score)  Note: Reported level is compatible with observation.                         When using our objective Pain Scale, levels between 6 and 10/10 are said to belong in an emergency room, as it progressively worsens from a 6/10, described as severely limiting, requiring emergency care not usually available at an outpatient pain management facility. At a 6/10 level, communication becomes difficult and requires great effort. Assistance to reach the emergency department may be required. Facial flushing and profuse sweating along with potentially dangerous increases in heart rate and blood pressure will be  evident. Effect on ADL:   Timing: Constant Modifying factors: sitting still  Mr. Sobieski comes in today for post-procedure evaluation after the treatment done on 04/27/2017.  Further details on both, my assessment(s), as well as the proposed treatment plan, please see below.  Post-Procedure Assessment  04/27/2017 Procedure: L4-L5 ESI Pre-procedure pain score:  3/10 Post-procedure pain score: 0/10         Influential Factors: BMI: 29.95 kg/m Intra-procedural challenges: None observed.         Assessment challenges: None detected.              Reported side-effects: None.        Post-procedural adverse reactions or complications: None reported         Sedation: Please see nurses note. When no sedatives are used, the analgesic levels obtained are directly associated to the effectiveness of the local anesthetics. However, when sedation is provided, the level of analgesia obtained during the initial 1 hour following the intervention, is believed to be the result of a combination of factors. These factors may include, but are not limited to: 1. The effectiveness of the local anesthetics used. 2. The effects of the analgesic(s) and/or anxiolytic(s) used. 3. The degree of discomfort experienced by the patient at the time of the procedure. 4. The patients ability and reliability in recalling and recording the events. 5. The presence and influence of possible secondary gains and/or psychosocial factors. Reported result: Relief experienced during the 1st hour after the procedure: 100 % (Ultra-Short Term Relief)            Interpretative annotation: Clinically appropriate result. Analgesia during this period is likely to be  Local Anesthetic and/or IV Sedative (Analgesic/Anxiolytic) related.          Effects of local anesthetic: The analgesic effects attained during this period are directly associated to the localized infiltration of local anesthetics and therefore cary significant diagnostic value as  to the etiological location, or anatomical origin, of the pain. Expected duration of relief is directly dependent on the pharmacodynamics of the local anesthetic used. Long-acting (4-6 hours) anesthetics used.  Reported result: Relief during the next 4 to 6 hour after the procedure: 80 % (Short-Term Relief)            Interpretative annotation: Clinically appropriate result. Analgesia during this period is likely to be Local Anesthetic-related.          Long-term benefit: Defined as the period of time past the expected duration of local anesthetics (1 hour for short-acting and 4-6 hours for long-acting). With the possible exception of prolonged sympathetic blockade from the local anesthetics, benefits during this period are typically attributed to, or associated with, other factors such as analgesic sensory neuropraxia, antiinflammatory effects, or beneficial biochemical changes provided by agents other than the local anesthetics.  Reported result: Extended relief following procedure: 60 % (Long-Term Relief)            Interpretative annotation: Clinically appropriate result. Good relief. No permanent benefit expected. Inflammation plays a part in the etiology to the pain.          Current benefits: Defined as reported results that persistent at this point in time.   Analgesia: 50 % Mr. Kiedrowski reports that both, extremity and the axial pain improved with the treatment. Function: Somewhat improved ROM: Somewhat improved Interpretative annotation: Recurrence of symptoms. Therapeutic benefit observed. Effective diagnostic intervention.          Interpretation: Results would suggest a successful diagnostic intervention.                  Plan:  Please see "Plan of Care" for details.                Laboratory Chemistry  Inflammation Markers (CRP: Acute Phase) (ESR: Chronic Phase) Lab Results  Component Value Date   ESRSEDRATE 1 01/12/2013                         Rheumatology Markers No results  found for: RF, ANA, LABURIC, URICUR, LYMEIGGIGMAB, LYMEABIGMQN              Renal Function Markers Lab Results  Component Value Date   BUN 19 (H) 01/26/2013   CREATININE 1.18 01/26/2013   GFRAA >60 01/26/2013   GFRNONAA >60 01/26/2013                 Hepatic Function Markers Lab Results  Component Value Date   AST 18 08/11/2011   ALT 35 08/11/2011   ALBUMIN 3.8 08/11/2011   ALKPHOS 75 08/11/2011                 Electrolytes Lab Results  Component Value Date   NA 130 (L) 01/26/2013   K 4.6 01/26/2013   CL 101 01/26/2013   CALCIUM 8.3 (L) 01/26/2013                        Neuropathy Markers Lab Results  Component Value Date   HGBA1C 6.9 (H) 01/25/2013  Bone Pathology Markers No results found for: Marveen Reeks, [id number], [id number], 25OHVITD1, 25OHVITD2, 25OHVITD3, TESTOFREE, TESTOSTERONE                       Coagulation Parameters Lab Results  Component Value Date   INR 0.9 01/12/2013   LABPROT 12.0 01/12/2013   APTT 29.5 01/12/2013   PLT 220 01/26/2013                 Cardiovascular Markers Lab Results  Component Value Date   CKTOTAL 483 (H) 01/25/2013   CKMB 4.7 (H) 01/25/2013   TROPONINI < 0.02 01/25/2013   HGB 11.8 (L) 01/26/2013   HCT 47.5 01/12/2013                 CA Markers No results found for: CEA, CA125, LABCA2               Note: Lab results reviewed.  Recent Diagnostic Imaging Results  DG C-Arm 1-60 Min-No Report Fluoroscopy was utilized by the requesting physician.  No radiographic  interpretation.   Complexity Note: Imaging results reviewed. Results shared with Mr. Mallon, using Layman's terms.                         Meds   Current Outpatient Medications:  .  Aspirin Buf,CaCarb-MgCarb-MgO, (BUFFERED ASPIRIN) 325 MG TABS, Take by mouth., Disp: , Rfl:  .  atorvastatin (LIPITOR) 80 MG tablet, , Disp: , Rfl: 2 .  co-enzyme Q-10 30 MG capsule, Take 30 mg by mouth daily., Disp: , Rfl:  .  lisinopril  (PRINIVIL,ZESTRIL) 2.5 MG tablet, , Disp: , Rfl:  .  metoprolol succinate (TOPROL-XL) 50 MG 24 hr tablet, Take 50 mg by mouth daily. , Disp: , Rfl:  .  omega-3 acid ethyl esters (LOVAZA) 1 g capsule, Take by mouth., Disp: , Rfl:  .  omeprazole (PRILOSEC) 40 MG capsule, , Disp: , Rfl: 3 .  pravastatin (PRAVACHOL) 80 MG tablet, Take by mouth., Disp: , Rfl:  .  ranitidine (ZANTAC) 300 MG tablet, Take by mouth., Disp: , Rfl:  .  tamsulosin (FLOMAX) 0.4 MG CAPS capsule, TAKE 1 CAPSULE BY MOUTH ONCE DAILY. TAKE 30 MINUTES AFTER SAME MEAL EACH DAY., Disp: , Rfl:  .  b complex vitamins tablet, Take 1 tablet by mouth daily., Disp: , Rfl:  .  Blood Glucose Monitoring Suppl (ONE TOUCH ULTRA SYSTEM KIT) w/Device KIT, , Disp: , Rfl:  .  lisinopril (PRINIVIL,ZESTRIL) 20 MG tablet, Take 20 mg by mouth daily. , Disp: , Rfl:  .  Multiple Vitamin tablet, , Disp: , Rfl:  .  polyethylene glycol powder (GLYCOLAX/MIRALAX) powder, 255 grams one bottle for colonoscopy prep (Patient not taking: Reported on 06/02/2017), Disp: 255 g, Rfl: 0  ROS  Constitutional: Denies any fever or chills Gastrointestinal: No reported hemesis, hematochezia, vomiting, or acute GI distress Musculoskeletal: Denies any acute onset joint swelling, redness, loss of ROM, or weakness Neurological: No reported episodes of acute onset apraxia, aphasia, dysarthria, agnosia, amnesia, paralysis, loss of coordination, or loss of consciousness  Allergies  Mr. Ryant is allergic to sulfa antibiotics.  [place]  Drug: Mr. Dehner  reports that he does not use drugs. Alcohol:  reports that he drinks alcohol. Tobacco:  reports that he has quit smoking. His smoking use included cigarettes. He has a 30.00 pack-year smoking history. he has never used smokeless tobacco. Medical:  has a past medical history of GERD (gastroesophageal  reflux disease), Hyperlipidemia, and Hypertension. Surgical: Mr. Barris  has a past surgical history that includes Partial hip  arthroplasty (2014); Tongue Biopsy (2006); Port-a-cath removal; Colonoscopy (12/08/2003); Tonsillectomy; and Upper gi endoscopy (01/25/14). Family: family history includes Diabetes in his mother; Lung cancer in his father.  Constitutional Exam  General appearance: Well nourished, well developed, and well hydrated. In no apparent acute distress Vitals:   06/02/17 1200 06/02/17 1205  BP: (!) 187/94 (!) 173/92  Pulse: 87   Resp: 16   Temp: 98.1 F (36.7 C)   SpO2: 99%   Weight: 180 lb (81.6 kg)   Height: _0  (1.651 m)    BMI Assessment: Estimated body mass index is 29.95 kg/m as calculated from the following:   Height as of this encounter: _1  (1.651 m).   Weight as of this encounter: 180 lb (81.6 kg).  BMI interpretation table: BMI level Category Range association with higher incidence of chronic pain  <18 kg/m2 Underweight   18.5-24.9 kg/m2 Ideal body weight   25-29.9 kg/m2 Overweight Increased incidence by 20%  30-34.9 kg/m2 Obese (Class I) Increased incidence by 68%  35-39.9 kg/m2 Severe obesity (Class II) Increased incidence by 136%  >40 kg/m2 Extreme obesity (Class III) Increased incidence by 254%   BMI Readings from Last 4 Encounters:  06/02/17 29.95 kg/m  04/27/17 29.05 kg/m  10/31/16 29.62 kg/m  04/22/16 30.55 kg/m   Wt Readings from Last 4 Encounters:  06/02/17 180 lb (81.6 kg)  04/27/17 180 lb (81.6 kg)  10/31/16 178 lb (80.7 kg)  04/22/16 183 lb 9.6 oz (83.3 kg)  Psych/Mental status: Alert, oriented x 3 (person, place, & time)       Eyes: PERLA Respiratory: No evidence of acute respiratory distress  Cervical Spine Area Exam  Skin & Axial Inspection: No masses, redness, edema, swelling, or associated skin lesions Alignment: Symmetrical Functional ROM: Unrestricted ROM      Stability: No instability detected Muscle Tone/Strength: Functionally intact. No obvious neuro-muscular anomalies detected. Sensory (Neurological): Unimpaired Palpation: No palpable  anomalies              Upper Extremity (UE) Exam    Side: Right upper extremity  Side: Left upper extremity  Skin & Extremity Inspection: Skin color, temperature, and hair growth are WNL. No peripheral edema or cyanosis. No masses, redness, swelling, asymmetry, or associated skin lesions. No contractures.  Skin & Extremity Inspection: Skin color, temperature, and hair growth are WNL. No peripheral edema or cyanosis. No masses, redness, swelling, asymmetry, or associated skin lesions. No contractures.  Functional ROM: Unrestricted ROM          Functional ROM: Unrestricted ROM          Muscle Tone/Strength: Functionally intact. No obvious neuro-muscular anomalies detected.  Muscle Tone/Strength: Functionally intact. No obvious neuro-muscular anomalies detected.  Sensory (Neurological): Unimpaired          Sensory (Neurological): Unimpaired          Palpation: No palpable anomalies              Palpation: No palpable anomalies              Specialized Test(s): Deferred         Specialized Test(s): Deferred          Thoracic Spine Area Exam  Skin & Axial Inspection: No masses, redness, or swelling Alignment: Symmetrical Functional ROM: Unrestricted ROM Stability: No instability detected Muscle Tone/Strength: Functionally intact. No obvious neuro-muscular anomalies detected. Sensory (  Neurological): Unimpaired Muscle strength & Tone: No palpable anomalies  Lumbar Spine Area Exam  Skin & Axial Inspection: No masses, redness, or swelling Alignment: Symmetrical Functional ROM: Unrestricted ROM      Stability: No instability detected Muscle Tone/Strength: Functionally intact. No obvious neuro-muscular anomalies detected. Sensory (Neurological): Unimpaired Palpation: No palpable anomalies       Provocative Tests: Lumbar Hyperextension and rotation test: Positive due to pain. Lumbar Lateral bending test: Positive ipsilateral radicular pain, bilaterally. Positive for bilateral foraminal  stenosis. Patrick's Maneuver: evaluation deferred today                    Gait & Posture Assessment  Ambulation: Unassisted Gait: Relatively normal for age and body habitus Posture: WNL   Lower Extremity Exam    Side: Right lower extremity  Side: Left lower extremity  Skin & Extremity Inspection: Skin color, temperature, and hair growth are WNL. No peripheral edema or cyanosis. No masses, redness, swelling, asymmetry, or associated skin lesions. No contractures.  Skin & Extremity Inspection: Skin color, temperature, and hair growth are WNL. No peripheral edema or cyanosis. No masses, redness, swelling, asymmetry, or associated skin lesions. No contractures.  Functional ROM: Unrestricted ROM          Functional ROM: Unrestricted ROM          Muscle Tone/Strength: Functionally intact. No obvious neuro-muscular anomalies detected.  Muscle Tone/Strength: Functionally intact. No obvious neuro-muscular anomalies detected.  Sensory (Neurological): Unimpaired  Sensory (Neurological): Unimpaired  Palpation: No palpable anomalies  Palpation: No palpable anomalies   Assessment  Primary Diagnosis & Pertinent Problem List: The primary encounter diagnosis was Lumbar radiculopathy. Diagnoses of Lumbar degenerative disc disease and Spinal stenosis, lumbar region, with neurogenic claudication were also pertinent to this visit.  Status Diagnosis  Responding Persistent Persistent 1. Lumbar radiculopathy   2. Lumbar degenerative disc disease   3. Spinal stenosis, lumbar region, with neurogenic claudication     Problems updated and reviewed during this visit: Problem  Lumbar Radiculopathy  Lumbar Degenerative Disc Disease  Spinal Stenosis, Lumbar Region, With Neurogenic Claudication   72 year old male with lumbar radiculopathy status post L4-L5 epidural steroid injection who returns for postprocedural follow-up.  Patient notes approximately 50-60% pain relief of his low back and bilateral leg radicular  symptoms for approximately 3-4 weeks after the injection but notes return of pain in his back and bilateral legs.  He states that the pain is not as severe as prior to his epidural steroid injection but states that he has returned since his epidural injection on 04/27/2017.  We discussed repeating the epidural steroid injection to hopefully extend the duration of benefit.  Risks and benefits were discussed.  Patient would like to proceed.  Plan: -Repeat L4-L5 ESI #2.  Patient instructed to stop aspirin 325 mg 5 days prior to scheduled procedure and to take 81 mg aspirin instead.  Lab-work, procedure(s), and/or referral(s): Orders Placed This Encounter  Procedures  . Lumbar Epidural Injection    Provider-requested follow-up: Return in about 2 weeks (around 06/16/2017) for Procedure. Time Note: Greater than 50% of the 25 minute(s) of face-to-face time spent with Mr. Debois, was spent in counseling/coordination of care regarding: Mr. Hinshaw primary cause of pain, the treatment plan, treatment alternatives, the risks and possible complications of proposed treatment, going over the informed consent, the results, interpretation and significance of  his recent diagnostic interventional treatment(s) and realistic expectations. Future Appointments  Date Time Provider [place]  06/15/2017 10:45 AM Holley Raring,  Carlus Pavlov, MD [facility] None  10/02/2017 10:30 AM [facility] None  10/02/2017 11:30 AM Dew, Erskine Squibb, MD [facility] None    Primary Care Physician: Idelle Crouch, MD Location: [facility] Out[facility] Note by: Gillis Santa, M.D Date: 06/02/2017; Time: 3:03 PM  Patient Instructions   Repeat lumbar epidural steroid injection 1-2 weeks.  Please stop a full strength aspirin 5 days before scheduled procedure.  Okay to take 81 mg aspirin.GENERAL RISKS AND COMPLICATIONS  What are the risk, side effects and possible complications? Generally speaking, most procedures are safe.   However, with any procedure there are risks, side effects, and the possibility of complications.  The risks and complications are dependent upon the sites that are lesioned, or the type of nerve block to be performed.  The closer the procedure is to the spine, the more serious the risks are.  Great care is taken when placing the radio frequency needles, block needles or lesioning probes, but sometimes complications can occur. 1. Infection: Any time there is an injection through the skin, there is a risk of infection.  This is why sterile conditions are used for these blocks.  There are four possible types of infection. 1. Localized skin infection. 2. Central Nervous System Infection-This can be in the form of Meningitis, which can be deadly. 3. Epidural Infections-This can be in the form of an epidural abscess, which can cause pressure inside of the spine, causing compression of the spinal cord with subsequent paralysis. This would require an emergency surgery to decompress, and there are no guarantees that the patient would recover from the paralysis. 4. Discitis-This is an infection of the intervertebral discs.  It occurs in about 1% of discography procedures.  It is difficult to treat and it may lead to surgery.        2. Pain: the needles have to go through skin and soft tissues, will cause soreness.       3. Damage to internal structures:  The nerves to be lesioned may be near blood vessels or    other nerves which can be potentially damaged.       4. Bleeding: Bleeding is more common if the patient is taking blood thinners such as  aspirin, Coumadin, Ticiid, Plavix, etc., or if he/she have some genetic predisposition  such as hemophilia. Bleeding into the spinal canal can cause compression of the spinal  cord with subsequent paralysis.  This would require an emergency surgery to  decompress and there are no guarantees that the patient would recover from the  paralysis.       5. Pneumothorax:   Puncturing of a lung is a possibility, every time a needle is introduced in  the area of the chest or upper back.  Pneumothorax refers to free air around the  collapsed lung(s), inside of the thoracic cavity (chest cavity).  Another two possible  complications related to a similar event would include: Hemothorax and Chylothorax.   These are variations of the Pneumothorax, where instead of air around the collapsed  lung(s), you may have blood or chyle, respectively.       6. Spinal headaches: They may occur with any procedures in the area of the spine.       7. Persistent CSF (Cerebro-Spinal Fluid) leakage: This is a rare problem, but may occur  with prolonged intrathecal or epidural catheters either due to the formation of a fistulous  track or a dural tear.  8. Nerve damage: By working so close to the spinal cord, there is always a possibility of  nerve damage, which could be as serious as a permanent spinal cord injury with  paralysis.       9. Death:  Although rare, severe deadly allergic reactions known as "Anaphylactic  reaction" can occur to any of the medications used.      10. Worsening of the symptoms:  We can always make thing worse.  What are the chances of something like this happening? Chances of any of this occuring are extremely low.  By statistics, you have more of a chance of getting killed in a motor vehicle accident: while driving to the hospital than any of the above occurring .  Nevertheless, you should be aware that they are possibilities.  In general, it is similar to taking a shower.  Everybody knows that you can slip, hit your head and get killed.  Does that mean that you should not shower again?  Nevertheless always keep in mind that statistics do not mean anything if you happen to be on the wrong side of them.  Even if a procedure has a 1 (one) in a 1,000,000 (million) chance of going wrong, it you happen to be that one..Also, keep in mind that by statistics, you have more of  a chance of having something go wrong when taking medications.  Who should not have this procedure? If you are on a blood thinning medication (e.g. Coumadin, Plavix, see list of "Blood Thinners"), or if you have an active infection going on, you should not have the procedure.  If you are taking any blood thinners, please inform your physician.  How should I prepare for this procedure?  Do not eat or drink anything at least six hours prior to the procedure.  Bring a driver with you .  It cannot be a taxi.  Come accompanied by an adult that can drive you back, and that is strong enough to help you if your legs get weak or numb from the local anesthetic.  Take all of your medicines the morning of the procedure with just enough water to swallow them.  If you have diabetes, make sure that you are scheduled to have your procedure done first thing in the morning, whenever possible.  If you have diabetes, take only half of your insulin dose and notify our nurse that you have done so as soon as you arrive at the clinic.  If you are diabetic, but only take blood sugar pills (oral hypoglycemic), then do not take them on the morning of your procedure.  You may take them after you have had the procedure.  Do not take aspirin or any aspirin-containing medications, at least eleven (11) days prior to the procedure.  They may prolong bleeding.  Wear loose fitting clothing that may be easy to take off and that you would not mind if it got stained with Betadine or blood.  Do not wear any jewelry or perfume  Remove any nail coloring.  It will interfere with some of our monitoring equipment.  NOTE: Remember that this is not meant to be interpreted as a complete list of all possible complications.  Unforeseen problems may occur.  BLOOD THINNERS The following drugs contain aspirin or other products, which can cause increased bleeding during surgery and should not be taken for 2 weeks prior to and 1 week  after surgery.  If you should need take something for relief of minor  pain, you may take acetaminophen which is found in Tylenol,m Datril, Anacin-3 and Panadol. It is not blood thinner. The products listed below are.  Do not take any of the products listed below in addition to any listed on your instruction sheet.  A.P.C or A.P.C with Codeine Codeine Phosphate Capsules #3 Ibuprofen Ridaura  ABC compound Congesprin Imuran rimadil  Advil Cope Indocin Robaxisal  Alka-Seltzer Effervescent Pain Reliever and Antacid Coricidin or Coricidin-D  Indomethacin Rufen  Alka-Seltzer plus Cold Medicine Cosprin Ketoprofen S-A-C Tablets  Anacin Analgesic Tablets or Capsules Coumadin Korlgesic Salflex  Anacin Extra Strength Analgesic tablets or capsules CP-2 Tablets Lanoril Salicylate  Anaprox Cuprimine Capsules Levenox Salocol  Anexsia-D Dalteparin Magan Salsalate  Anodynos Darvon compound Magnesium Salicylate Sine-off  Ansaid Dasin Capsules Magsal Sodium Salicylate  Anturane Depen Capsules Marnal Soma  APF Arthritis pain formula Dewitt's Pills Measurin Stanback  Argesic Dia-Gesic Meclofenamic Sulfinpyrazone  Arthritis Bayer Timed Release Aspirin Diclofenac Meclomen Sulindac  Arthritis pain formula Anacin Dicumarol Medipren Supac  Analgesic (Safety coated) Arthralgen Diffunasal Mefanamic Suprofen  Arthritis Strength Bufferin Dihydrocodeine Mepro Compound Suprol  Arthropan liquid Dopirydamole Methcarbomol with Aspirin Synalgos  ASA tablets/Enseals Disalcid Micrainin Tagament  Ascriptin Doan's Midol Talwin  Ascriptin A/D Dolene Mobidin Tanderil  Ascriptin Extra Strength Dolobid Moblgesic Ticlid  Ascriptin with Codeine Doloprin or Doloprin with Codeine Momentum Tolectin  Asperbuf Duoprin Mono-gesic Trendar  Aspergum Duradyne Motrin or Motrin IB Triminicin  Aspirin plain, buffered or enteric coated Durasal Myochrisine Trigesic  Aspirin Suppositories Easprin Nalfon Trillsate  Aspirin with Codeine Ecotrin  Regular or Extra Strength Naprosyn Uracel  Atromid-S Efficin Naproxen Ursinus  Auranofin Capsules Elmiron Neocylate Vanquish  Axotal Emagrin Norgesic Verin  Azathioprine Empirin or Empirin with Codeine Normiflo Vitamin E  Azolid Emprazil Nuprin Voltaren  Bayer Aspirin plain, buffered or children's or timed BC Tablets or powders Encaprin Orgaran Warfarin Sodium  Buff-a-Comp Enoxaparin Orudis Zorpin  Buff-a-Comp with Codeine Equegesic Os-Cal-Gesic   Buffaprin Excedrin plain, buffered or Extra Strength Oxalid   Bufferin Arthritis Strength Feldene Oxphenbutazone   Bufferin plain or Extra Strength Feldene Capsules Oxycodone with Aspirin   Bufferin with Codeine Fenoprofen Fenoprofen Pabalate or Pabalate-SF   Buffets II Flogesic Panagesic   Buffinol plain or Extra Strength Florinal or Florinal with Codeine Panwarfarin   Buf-Tabs Flurbiprofen Penicillamine   Butalbital Compound Four-way cold tablets Penicillin   Butazolidin Fragmin Pepto-Bismol   Carbenicillin Geminisyn Percodan   Carna Arthritis Reliever Geopen Persantine   Carprofen Gold's salt Persistin   Chloramphenicol Goody's Phenylbutazone   Chloromycetin Haltrain Piroxlcam   Clmetidine heparin Plaquenil   Cllnoril Hyco-pap Ponstel   Clofibrate Hydroxy chloroquine Propoxyphen         Before stopping any of these medications, be sure to consult the physician who ordered them.  Some, such as Coumadin (Warfarin) are ordered to prevent or treat serious conditions such as "deep thrombosis", "pumonary embolisms", and other heart problems.  The amount of time that you may need off of the medication may also vary with the medication and the reason for which you were taking it.  If you are taking any of these medications, please make sure you notify your pain physician before you undergo any procedures.         Epidural Steroid Injection Patient Information  Description: The epidural space surrounds the nerves as they exit the spinal  cord.  In some patients, the nerves can be compressed and inflamed by a bulging disc or a tight spinal canal (spinal stenosis).  By injecting  steroids into the epidural space, we can bring irritated nerves into direct contact with a potentially helpful medication.  These steroids act directly on the irritated nerves and can reduce swelling and inflammation which often leads to decreased pain.  Epidural steroids may be injected anywhere along the spine and from the neck to the low back depending upon the location of your pain.   After numbing the skin with local anesthetic (like Novocaine), a small needle is passed into the epidural space slowly.  You may experience a sensation of pressure while this is being done.  The entire block usually last less than 10 minutes.  Conditions which may be treated by epidural steroids:   Low back and leg pain  Neck and arm pain  Spinal stenosis  Post-laminectomy syndrome  Herpes zoster (shingles) pain  Pain from compression fractures  Preparation for the injection:  1. Do not eat any solid food or dairy products within 8 hours of your appointment.  2. You may drink clear liquids up to 3 hours before appointment.  Clear liquids include water, black coffee, juice or soda.  No milk or cream please. 3. You may take your regular medication, including pain medications, with a sip of water before your appointment  Diabetics should hold regular insulin (if taken separately) and take 1/2 normal NPH dos the morning of the procedure.  Carry some sugar containing items with you to your appointment. 4. A driver must accompany you and be prepared to drive you home after your procedure.  5. Bring all your current medications with your. 6. An IV may be inserted and sedation may be given at the discretion of the physician.   7. A blood pressure cuff, EKG and other monitors will often be applied during the procedure.  Some patients may need to have extra oxygen administered  for a short period. 8. You will be asked to provide medical information, including your allergies, prior to the procedure.  We must know immediately if you are taking blood thinners (like Coumadin/Warfarin)  Or if you are allergic to IV iodine contrast (dye). We must know if you could possible be pregnant.  Possible side-effects:  Bleeding from needle site  Infection (rare, may require surgery)  Nerve injury (rare)  Numbness & tingling (temporary)  Difficulty urinating (rare, temporary)  Spinal headache ( a headache worse with upright posture)  Light -headedness (temporary)  Pain at injection site (several days)  Decreased blood pressure (temporary)  Weakness in arm/leg (temporary)  Pressure sensation in back/neck (temporary)  Call if you experience:  Fever/chills associated with headache or increased back/neck pain.  Headache worsened by an upright position.  New onset weakness or numbness of an extremity below the injection site  Hives or difficulty breathing (go to the emergency room)  Inflammation or drainage at the infection site  Severe back/neck pain  Any new symptoms which are concerning to you  Please note:  Although the local anesthetic injected can often make your back or neck feel good for several hours after the injection, the pain will likely return.  It takes 3-7 days for steroids to work in the epidural space.  You may not notice any pain relief for at least that one week.  If effective, we will often do a series of three injections spaced 3-6 weeks apart to maximally decrease your pain.  After the initial series, we generally will wait several months before considering a repeat injection of the same type.  If you have  any questions, please call [phone] [facility]

## 2017-06-02 NOTE — Progress Notes (Signed)
Safety precautions to be maintained throughout the outpatient stay will include: orient to surroundings, keep bed in low position, maintain call bell within reach at all times, provide assistance with transfer out of bed and ambulation.  

## 2017-06-02 NOTE — Patient Instructions (Addendum)
Repeat lumbar epidural steroid injection 1-2 weeks.  Please stop a full strength aspirin 5 days before scheduled procedure.  Okay to take 81 mg aspirin.GENERAL RISKS AND COMPLICATIONS  What are the risk, side effects and possible complications? Generally speaking, most procedures are safe.  However, with any procedure there are risks, side effects, and the possibility of complications.  The risks and complications are dependent upon the sites that are lesioned, or the type of nerve block to be performed.  The closer the procedure is to the spine, the more serious the risks are.  Great care is taken when placing the radio frequency needles, block needles or lesioning probes, but sometimes complications can occur. 1. Infection: Any time there is an injection through the skin, there is a risk of infection.  This is why sterile conditions are used for these blocks.  There are four possible types of infection. 1. Localized skin infection. 2. Central Nervous System Infection-This can be in the form of Meningitis, which can be deadly. 3. Epidural Infections-This can be in the form of an epidural abscess, which can cause pressure inside of the spine, causing compression of the spinal cord with subsequent paralysis. This would require an emergency surgery to decompress, and there are no guarantees that the patient would recover from the paralysis. 4. Discitis-This is an infection of the intervertebral discs.  It occurs in about 1% of discography procedures.  It is difficult to treat and it may lead to surgery.        2. Pain: the needles have to go through skin and soft tissues, will cause soreness.       3. Damage to internal structures:  The nerves to be lesioned may be near blood vessels or    other nerves which can be potentially damaged.       4. Bleeding: Bleeding is more common if the patient is taking blood thinners such as  aspirin, Coumadin, Ticiid, Plavix, etc., or if he/she have some genetic  predisposition  such as hemophilia. Bleeding into the spinal canal can cause compression of the spinal  cord with subsequent paralysis.  This would require an emergency surgery to  decompress and there are no guarantees that the patient would recover from the  paralysis.       5. Pneumothorax:  Puncturing of a lung is a possibility, every time a needle is introduced in  the area of the chest or upper back.  Pneumothorax refers to free air around the  collapsed lung(s), inside of the thoracic cavity (chest cavity).  Another two possible  complications related to a similar event would include: Hemothorax and Chylothorax.   These are variations of the Pneumothorax, where instead of air around the collapsed  lung(s), you may have blood or chyle, respectively.       6. Spinal headaches: They may occur with any procedures in the area of the spine.       7. Persistent CSF (Cerebro-Spinal Fluid) leakage: This is a rare problem, but may occur  with prolonged intrathecal or epidural catheters either due to the formation of a fistulous  track or a dural tear.       8. Nerve damage: By working so close to the spinal cord, there is always a possibility of  nerve damage, which could be as serious as a permanent spinal cord injury with  paralysis.       9. Death:  Although rare, severe deadly allergic reactions known as "Anaphylactic  reaction"  can occur to any of the medications used.      10. Worsening of the symptoms:  We can always make thing worse.  What are the chances of something like this happening? Chances of any of this occuring are extremely low.  By statistics, you have more of a chance of getting killed in a motor vehicle accident: while driving to the hospital than any of the above occurring .  Nevertheless, you should be aware that they are possibilities.  In general, it is similar to taking a shower.  Everybody knows that you can slip, hit your head and get killed.  Does that mean that you should not  shower again?  Nevertheless always keep in mind that statistics do not mean anything if you happen to be on the wrong side of them.  Even if a procedure has a 1 (one) in a 1,000,000 (million) chance of going wrong, it you happen to be that one..Also, keep in mind that by statistics, you have more of a chance of having something go wrong when taking medications.  Who should not have this procedure? If you are on a blood thinning medication (e.g. Coumadin, Plavix, see list of "Blood Thinners"), or if you have an active infection going on, you should not have the procedure.  If you are taking any blood thinners, please inform your physician.  How should I prepare for this procedure?  Do not eat or drink anything at least six hours prior to the procedure.  Bring a driver with you .  It cannot be a taxi.  Come accompanied by an adult that can drive you back, and that is strong enough to help you if your legs get weak or numb from the local anesthetic.  Take all of your medicines the morning of the procedure with just enough water to swallow them.  If you have diabetes, make sure that you are scheduled to have your procedure done first thing in the morning, whenever possible.  If you have diabetes, take only half of your insulin dose and notify our nurse that you have done so as soon as you arrive at the clinic.  If you are diabetic, but only take blood sugar pills (oral hypoglycemic), then do not take them on the morning of your procedure.  You may take them after you have had the procedure.  Do not take aspirin or any aspirin-containing medications, at least eleven (11) days prior to the procedure.  They may prolong bleeding.  Wear loose fitting clothing that may be easy to take off and that you would not mind if it got stained with Betadine or blood.  Do not wear any jewelry or perfume  Remove any nail coloring.  It will interfere with some of our monitoring equipment.  NOTE: Remember that  this is not meant to be interpreted as a complete list of all possible complications.  Unforeseen problems may occur.  BLOOD THINNERS The following drugs contain aspirin or other products, which can cause increased bleeding during surgery and should not be taken for 2 weeks prior to and 1 week after surgery.  If you should need take something for relief of minor pain, you may take acetaminophen which is found in Tylenol,m Datril, Anacin-3 and Panadol. It is not blood thinner. The products listed below are.  Do not take any of the products listed below in addition to any listed on your instruction sheet.  A.P.C or A.P.C with Codeine Codeine Phosphate Capsules #3 Ibuprofen  Ridaura  ABC compound Congesprin Imuran rimadil  Advil Cope Indocin Robaxisal  Alka-Seltzer Effervescent Pain Reliever and Antacid Coricidin or Coricidin-D  Indomethacin Rufen  Alka-Seltzer plus Cold Medicine Cosprin Ketoprofen S-A-C Tablets  Anacin Analgesic Tablets or Capsules Coumadin Korlgesic Salflex  Anacin Extra Strength Analgesic tablets or capsules CP-2 Tablets Lanoril Salicylate  Anaprox Cuprimine Capsules Levenox Salocol  Anexsia-D Dalteparin Magan Salsalate  Anodynos Darvon compound Magnesium Salicylate Sine-off  Ansaid Dasin Capsules Magsal Sodium Salicylate  Anturane Depen Capsules Marnal Soma  APF Arthritis pain formula Dewitt's Pills Measurin Stanback  Argesic Dia-Gesic Meclofenamic Sulfinpyrazone  Arthritis Bayer Timed Release Aspirin Diclofenac Meclomen Sulindac  Arthritis pain formula Anacin Dicumarol Medipren Supac  Analgesic (Safety coated) Arthralgen Diffunasal Mefanamic Suprofen  Arthritis Strength Bufferin Dihydrocodeine Mepro Compound Suprol  Arthropan liquid Dopirydamole Methcarbomol with Aspirin Synalgos  ASA tablets/Enseals Disalcid Micrainin Tagament  Ascriptin Doan's Midol Talwin  Ascriptin A/D Dolene Mobidin Tanderil  Ascriptin Extra Strength Dolobid Moblgesic Ticlid  Ascriptin with Codeine  Doloprin or Doloprin with Codeine Momentum Tolectin  Asperbuf Duoprin Mono-gesic Trendar  Aspergum Duradyne Motrin or Motrin IB Triminicin  Aspirin plain, buffered or enteric coated Durasal Myochrisine Trigesic  Aspirin Suppositories Easprin Nalfon Trillsate  Aspirin with Codeine Ecotrin Regular or Extra Strength Naprosyn Uracel  Atromid-S Efficin Naproxen Ursinus  Auranofin Capsules Elmiron Neocylate Vanquish  Axotal Emagrin Norgesic Verin  Azathioprine Empirin or Empirin with Codeine Normiflo Vitamin E  Azolid Emprazil Nuprin Voltaren  Bayer Aspirin plain, buffered or children's or timed BC Tablets or powders Encaprin Orgaran Warfarin Sodium  Buff-a-Comp Enoxaparin Orudis Zorpin  Buff-a-Comp with Codeine Equegesic Os-Cal-Gesic   Buffaprin Excedrin plain, buffered or Extra Strength Oxalid   Bufferin Arthritis Strength Feldene Oxphenbutazone   Bufferin plain or Extra Strength Feldene Capsules Oxycodone with Aspirin   Bufferin with Codeine Fenoprofen Fenoprofen Pabalate or Pabalate-SF   Buffets II Flogesic Panagesic   Buffinol plain or Extra Strength Florinal or Florinal with Codeine Panwarfarin   Buf-Tabs Flurbiprofen Penicillamine   Butalbital Compound Four-way cold tablets Penicillin   Butazolidin Fragmin Pepto-Bismol   Carbenicillin Geminisyn Percodan   Carna Arthritis Reliever Geopen Persantine   Carprofen Gold's salt Persistin   Chloramphenicol Goody's Phenylbutazone   Chloromycetin Haltrain Piroxlcam   Clmetidine heparin Plaquenil   Cllnoril Hyco-pap Ponstel   Clofibrate Hydroxy chloroquine Propoxyphen         Before stopping any of these medications, be sure to consult the physician who ordered them.  Some, such as Coumadin (Warfarin) are ordered to prevent or treat serious conditions such as "deep thrombosis", "pumonary embolisms", and other heart problems.  The amount of time that you may need off of the medication may also vary with the medication and the reason for which  you were taking it.  If you are taking any of these medications, please make sure you notify your pain physician before you undergo any procedures.         Epidural Steroid Injection Patient Information  Description: The epidural space surrounds the nerves as they exit the spinal cord.  In some patients, the nerves can be compressed and inflamed by a bulging disc or a tight spinal canal (spinal stenosis).  By injecting steroids into the epidural space, we can bring irritated nerves into direct contact with a potentially helpful medication.  These steroids act directly on the irritated nerves and can reduce swelling and inflammation which often leads to decreased pain.  Epidural steroids may be injected anywhere along the spine and from the neck to  the low back depending upon the location of your pain.   After numbing the skin with local anesthetic (like Novocaine), a small needle is passed into the epidural space slowly.  You may experience a sensation of pressure while this is being done.  The entire block usually last less than 10 minutes.  Conditions which may be treated by epidural steroids:   Low back and leg pain  Neck and arm pain  Spinal stenosis  Post-laminectomy syndrome  Herpes zoster (shingles) pain  Pain from compression fractures  Preparation for the injection:  1. Do not eat any solid food or dairy products within 8 hours of your appointment.  2. You may drink clear liquids up to 3 hours before appointment.  Clear liquids include water, black coffee, juice or soda.  No milk or cream please. 3. You may take your regular medication, including pain medications, with a sip of water before your appointment  Diabetics should hold regular insulin (if taken separately) and take 1/2 normal NPH dos the morning of the procedure.  Carry some sugar containing items with you to your appointment. 4. A driver must accompany you and be prepared to drive you home after your procedure.   5. Bring all your current medications with your. 6. An IV may be inserted and sedation may be given at the discretion of the physician.   7. A blood pressure cuff, EKG and other monitors will often be applied during the procedure.  Some patients may need to have extra oxygen administered for a short period. 8. You will be asked to provide medical information, including your allergies, prior to the procedure.  We must know immediately if you are taking blood thinners (like Coumadin/Warfarin)  Or if you are allergic to IV iodine contrast (dye). We must know if you could possible be pregnant.  Possible side-effects:  Bleeding from needle site  Infection (rare, may require surgery)  Nerve injury (rare)  Numbness & tingling (temporary)  Difficulty urinating (rare, temporary)  Spinal headache ( a headache worse with upright posture)  Light -headedness (temporary)  Pain at injection site (several days)  Decreased blood pressure (temporary)  Weakness in arm/leg (temporary)  Pressure sensation in back/neck (temporary)  Call if you experience:  Fever/chills associated with headache or increased back/neck pain.  Headache worsened by an upright position.  New onset weakness or numbness of an extremity below the injection site  Hives or difficulty breathing (go to the emergency room)  Inflammation or drainage at the infection site  Severe back/neck pain  Any new symptoms which are concerning to you  Please note:  Although the local anesthetic injected can often make your back or neck feel good for several hours after the injection, the pain will likely return.  It takes 3-7 days for steroids to work in the epidural space.  You may not notice any pain relief for at least that one week.  If effective, we will often do a series of three injections spaced 3-6 weeks apart to maximally decrease your pain.  After the initial series, we generally will wait several months before  considering a repeat injection of the same type.  If you have any questions, please call [phone] [facility]

## 2017-06-15 ENCOUNTER — Ambulatory Visit (HOSPITAL_BASED_OUTPATIENT_CLINIC_OR_DEPARTMENT_OTHER): Payer: Medicare Other | Admitting: Student in an Organized Health Care Education/Training Program

## 2017-06-15 ENCOUNTER — Other Ambulatory Visit: Payer: Self-pay

## 2017-06-15 ENCOUNTER — Encounter: Payer: Self-pay | Admitting: Student in an Organized Health Care Education/Training Program

## 2017-06-15 ENCOUNTER — Ambulatory Visit
Admission: RE | Admit: 2017-06-15 | Discharge: 2017-06-15 | Disposition: A | Discharge status: Home / Self Care | Payer: Medicare Other | Source: Ambulatory Visit | Attending: Student in an Organized Health Care Education/Training Program | Admitting: Student in an Organized Health Care Education/Training Program

## 2017-06-15 VITALS — BP 181/82 | HR 86 | Temp 98.2°F | Resp 18 | Ht 65.0 in | Wt 180.0 lb

## 2017-06-15 DIAGNOSIS — Z96649 Presence of unspecified artificial hip joint: Secondary | ICD-10-CM | POA: Diagnosis not present

## 2017-06-15 DIAGNOSIS — Z9889 Other specified postprocedural states: Secondary | ICD-10-CM | POA: Insufficient documentation

## 2017-06-15 DIAGNOSIS — M5416 Radiculopathy, lumbar region: Secondary | ICD-10-CM

## 2017-06-15 DIAGNOSIS — Z79899 Other long term (current) drug therapy: Secondary | ICD-10-CM | POA: Diagnosis not present

## 2017-06-15 DIAGNOSIS — Z882 Allergy status to sulfonamides status: Secondary | ICD-10-CM | POA: Diagnosis not present

## 2017-06-15 MED ORDER — SODIUM CHLORIDE 0.9% FLUSH
2.0000 mL | Freq: Once | INTRAVENOUS | Status: AC
  Administered 2017-06-15: 10 mL

## 2017-06-15 MED ORDER — ROPIVACAINE HCL 2 MG/ML IJ SOLN
2.0000 mL | Freq: Once | INTRAMUSCULAR | Status: AC
Start: 2017-06-15 — End: 2017-06-15
  Administered 2017-06-15: 10 mL via EPIDURAL

## 2017-06-15 MED ORDER — DEXAMETHASONE SODIUM PHOSPHATE 10 MG/ML IJ SOLN
10.0000 mg | Freq: Once | INTRAMUSCULAR | Status: AC
Start: 2017-06-15 — End: 2017-06-15
  Administered 2017-06-15: 10 mg

## 2017-06-15 MED ORDER — ROPIVACAINE HCL 2 MG/ML IJ SOLN
INTRAMUSCULAR | Status: AC
  Filled 2017-06-15: qty 10

## 2017-06-15 MED ORDER — DEXAMETHASONE SODIUM PHOSPHATE 10 MG/ML IJ SOLN
INTRAMUSCULAR | Status: AC
  Filled 2017-06-15: qty 1

## 2017-06-15 MED ORDER — SODIUM CHLORIDE 0.9 % IJ SOLN
INTRAMUSCULAR | Status: AC
  Filled 2017-06-15: qty 10

## 2017-06-15 MED ORDER — IOPAMIDOL (ISOVUE-M 200) INJECTION 41%
10.0000 mL | Freq: Once | INTRAMUSCULAR | Status: AC
  Administered 2017-06-15: 10 mL via EPIDURAL

## 2017-06-15 MED ORDER — LIDOCAINE HCL (PF) 1 % IJ SOLN
4.5000 mL | Freq: Once | INTRAMUSCULAR | Status: AC
  Administered 2017-06-15: 5 mL

## 2017-06-15 MED ORDER — LIDOCAINE HCL (PF) 1 % IJ SOLN
INTRAMUSCULAR | Status: AC
  Filled 2017-06-15: qty 5

## 2017-06-15 MED ORDER — IOPAMIDOL (ISOVUE-M 200) INJECTION 41%
INTRAMUSCULAR | Status: AC
  Filled 2017-06-15: qty 10

## 2017-06-15 NOTE — Progress Notes (Signed)
Safety precautions to be maintained throughout the outpatient stay will include: orient to surroundings, keep bed in low position, maintain call bell within reach at all times, provide assistance with transfer out of bed and ambulation.  

## 2017-06-15 NOTE — Progress Notes (Signed)
Patient's Name: Blake [name]  MRN: [id number]  Referring Provider: Idelle Crouch, MD  DOB: 08/24/1945  PCP: Idelle Crouch, MD  DOS: 06/15/2017  Note by: Gillis Santa, MD  Service setting: Ambulatory outpatient  Specialty: Interventional Pain Management  Patient type: Established  Location: [facility] (AMB) Pain Management Facility  Visit type: Interventional Procedure   Primary Reason for Visit: Interventional Pain Management Treatment. CC: Back Pain (lower bilateral )  Procedure:  Anesthesia, Analgesia, Anxiolysis:  Type: Therapeutic Inter-Laminar Epidural Steroid Injection #2  Region: Lumbar Level: L4-5 Level. Laterality: Midline         Type: Local Anesthesia Local Anesthetic: Lidocaine 1% Route: Infiltration ([place]/IM) IV Access: Declined Sedation: Declined  Indication(s): Analgesia and Anxiety   Indications: 1. Lumbar radiculopathy    Pain Score: Pre-procedure: 5 /10 Post-procedure: 0-No pain/10  Pre-op Assessment:  [name] is a 72 y.o. (year old), male patient, seen today for interventional treatment. He  has a past surgical history that includes Partial hip arthroplasty (2014); Tongue Biopsy (2006); Port-a-cath removal; Colonoscopy (12/08/2003); Tonsillectomy; and Upper gi endoscopy (01/25/14). Mr. Bohnenkamp has a current medication list which includes the following prescription(s): buffered aspirin, atorvastatin, b complex vitamins, one touch ultra system kit, co-enzyme q-10, lisinopril, lisinopril, metoprolol succinate, multiple vitamin, omega-3 acid ethyl esters, omeprazole, polyethylene glycol powder, pravastatin, tamsulosin, and ranitidine. His primarily concern today is the Back Pain (lower bilateral )  Initial Vital Signs:  Pulse Rate: 86 Temp: 98.2 F (36.8 C) ECG Heart Rate: 82 Resp: 16 BP: (!) 163/79 SpO2: 98 % ETCO2:    BMI: Estimated body mass index is 29.95 kg/m as calculated from the following:   Height as of this encounter: _0  (1.651 m).   Weight as of  this encounter: 180 lb (81.6 kg).  Risk Assessment: Allergies: Reviewed. He is allergic to sulfa antibiotics.  Allergy Precautions: None required Coagulopathies: Reviewed. None identified.  Blood-thinner therapy: None at this time Active Infection(s): Reviewed. None identified. [name] is afebrile  Site Confirmation: [name] was asked to confirm the procedure and laterality before marking the site Procedure checklist: Completed Consent: Before the procedure and under the influence of no sedative(s), amnesic(s), or anxiolytics, the patient was informed of the treatment options, risks and possible complications. To fulfill our ethical and legal obligations, as recommended by the American Medical Association's Code of Ethics, I have informed the patient of my clinical impression; the nature and purpose of the treatment or procedure; the risks, benefits, and possible complications of the intervention; the alternatives, including doing nothing; the risk(s) and benefit(s) of the alternative treatment(s) or procedure(s); and the risk(s) and benefit(s) of doing nothing. The patient was provided information about the general risks and possible complications associated with the procedure. These may include, but are not limited to: failure to achieve desired goals, infection, bleeding, organ or nerve damage, allergic reactions, paralysis, and death. In addition, the patient was informed of those risks and complications associated to Spine-related procedures, such as failure to decrease pain; infection (i.e.: Meningitis, epidural or intraspinal abscess); bleeding (i.e.: epidural hematoma, subarachnoid hemorrhage, or any other type of intraspinal or peri-dural bleeding); organ or nerve damage (i.e.: Any type of peripheral nerve, nerve root, or spinal cord injury) with subsequent damage to sensory, motor, and/or autonomic systems, resulting in permanent pain, numbness, and/or weakness of one or several areas of  the body; allergic reactions; (i.e.: anaphylactic reaction); and/or death. Furthermore, the patient was informed of those risks and complications associated with the medications.  These include, but are not limited to: allergic reactions (i.e.: anaphylactic or anaphylactoid reaction(s)); adrenal axis suppression; blood sugar elevation that in diabetics may result in ketoacidosis or comma; water retention that in patients with history of congestive heart failure may result in shortness of breath, pulmonary edema, and decompensation with resultant heart failure; weight gain; swelling or edema; medication-induced neural toxicity; particulate matter embolism and blood vessel occlusion with resultant organ, and/or nervous system infarction; and/or aseptic necrosis of one or more joints. Finally, the patient was informed that Medicine is not an exact science; therefore, there is also the possibility of unforeseen or unpredictable risks and/or possible complications that may result in a catastrophic outcome. The patient indicated having understood very clearly. We have given the patient no guarantees and we have made no promises. Enough time was given to the patient to ask questions, all of which were answered to the patient's satisfaction. [name] has indicated that he wanted to continue with the procedure. Attestation: I, the ordering provider, attest that I have discussed with the patient the benefits, risks, side-effects, alternatives, likelihood of achieving goals, and potential problems during recovery for the procedure that I have provided informed consent. Date: 06/15/2017; Time: 2:00 PM  Pre-Procedure Preparation:  Monitoring: As per clinic protocol. Respiration, ETCO2, SpO2, BP, heart rate and rhythm monitor placed and checked for adequate function Safety Precautions: Patient was assessed for positional comfort and pressure points before starting the procedure. Time-out: I initiated and conducted the  "Time-out" before starting the procedure, as per protocol. The patient was asked to participate by confirming the accuracy of the "Time Out" information. Verification of the correct person, site, and procedure were performed and confirmed by me, the nursing staff, and the patient. "Time-out" conducted as per Joint Commission's Universal Protocol (UP.01.01.01). "Time-out" Date & Time: 06/15/2017; 1138 hrs.  Description of Procedure Process:   Position: Prone with head of the table was raised to facilitate breathing. Target Area: The interlaminar space, initially targeting the lower laminar border of the superior vertebral body. Approach: Paramedial approach. Area Prepped: Entire Posterior Lumbar Region Prepping solution: ChloraPrep (2% chlorhexidine gluconate and 70% isopropyl alcohol) Safety Precautions: Aspiration looking for blood return was conducted prior to all injections. At no point did we inject any substances, as a needle was being advanced. No attempts were made at seeking any paresthesias. Safe injection practices and needle disposal techniques used. Medications properly checked for expiration dates. SDV (single dose vial) medications used. Description of the Procedure: Protocol guidelines were followed. The procedure needle was introduced through the skin, ipsilateral to the reported pain, and advanced to the target area. Bone was contacted and the needle walked caudad, until the lamina was cleared. The epidural space was identified using "loss-of-resistance technique" with 2-3 ml of PF-NaCl (0.9% NSS), in a 5cc LOR glass syringe. Vitals:   06/15/17 1110 06/15/17 1142 06/15/17 1149  BP: (!) 163/79 (!) 148/78 (!) 181/82  Pulse: 86    Resp: _0 Temp: 98.2 F (36.8 C)    TempSrc: Oral    SpO2: 98% 95% 99%  Weight: 180 lb (81.6 kg)    Height: _1  (1.651 m)      Start Time: 1139 hrs. End Time: 1146 hrs. Materials:  Needle(s) Type: Epidural needle Gauge: 17G Length:  3.5-in Medication(s): We administered iopamidol, ropivacaine (PF) 2 mg/mL (0.2%), sodium chloride flush, lidocaine (PF), and dexamethasone. Please see chart orders for dosing details. 9 CC solution consisting of 6 cc of preservative-free saline,  2 cc of 0.2% ropivacaine, 1 cc of Decadron 10 mg/cc. Imaging Guidance (Spinal):  Type of Imaging Technique: Fluoroscopy Guidance (Spinal) Indication(s): Assistance in needle guidance and placement for procedures requiring needle placement in or near specific anatomical locations not easily accessible without such assistance. Exposure Time: Please see nurses notes. Contrast: Before injecting any contrast, we confirmed that the patient did not have an allergy to iodine, shellfish, or radiological contrast. Once satisfactory needle placement was completed at the desired level, radiological contrast was injected. Contrast injected under live fluoroscopy. No contrast complications. See chart for type and volume of contrast used. Fluoroscopic Guidance: I was personally present during the use of fluoroscopy. "Tunnel Vision Technique" used to obtain the best possible view of the target area. Parallax error corrected before commencing the procedure. "Direction-depth-direction" technique used to introduce the needle under continuous pulsed fluoroscopy. Once target was reached, antero-posterior, oblique, and lateral fluoroscopic projection used confirm needle placement in all planes. Images permanently stored in EMR. Interpretation: I personally interpreted the imaging intraoperatively. Adequate needle placement confirmed in multiple planes. Appropriate spread of contrast into desired area was observed. No evidence of afferent or efferent intravascular uptake. No intrathecal or subarachnoid spread observed. Permanent images saved into the patient's record.  Antibiotic Prophylaxis:  Indication(s): None identified Antibiotic given: None  Post-operative Assessment:   Post-procedure Vital Signs:  Pulse Rate: 86 Temp: 98.2 F (36.8 C) ECG Heart Rate: 93 Resp: 18 BP: (!) 181/82 SpO2: 99 % ETCO2:   EBL: None Complications: No immediate post-treatment complications observed by team, or reported by patient. Note: The patient tolerated the entire procedure well. A repeat set of vitals were taken after the procedure and the patient was kept under observation following institutional policy, for this type of procedure. Post-procedural neurological assessment was performed, showing return to baseline, prior to discharge. The patient was provided with post-procedure discharge instructions, including a section on how to identify potential problems. Should any problems arise concerning this procedure, the patient was given instructions to immediately contact us, at any time, without hesitation. In any case, we plan to contact the patient by telephone for a follow-up status report regarding this interventional procedure. Comments:  No additional relevant information. 5 out of 5 strength bilateral lower extremity: Plantar flexion, dorsiflexion, knee flexion, knee extension.  Plan of Care   Imaging Orders     DG C-Arm 1-60 Min-No Report Procedure Orders    No procedure(s) ordered today    Medications ordered for procedure: Meds ordered this encounter  Medications  . iopamidol (ISOVUE-M) 41 % intrathecal injection 10 mL  . ropivacaine (PF) 2 mg/mL (0.2%) (NAROPIN) injection 2 mL  . sodium chloride flush (NS) 0.9 % injection 2 mL  . lidocaine (PF) (XYLOCAINE) 1 % injection 4.5 mL  . dexamethasone (DECADRON) injection 10 mg   Medications administered: We administered iopamidol, ropivacaine (PF) 2 mg/mL (0.2%), sodium chloride flush, lidocaine (PF), and dexamethasone.  See the medical record for exact dosing, route, and time of administration.  New Prescriptions   No medications on file   Disposition: Discharge home  Discharge Date & Time: 06/15/2017; 1155  hrs.   Physician-requested Follow-up: Return in about 4 weeks (around 07/13/2017) for Post Procedure Evaluation.  Future Appointments  Date Time Provider [place]  07/13/2017  1:45 PM Gillis Santa, MD [facility] None  10/02/2017 10:30 AM [facility] None  10/02/2017 11:30 AM Dew, Erskine Squibb, MD [facility] None   Primary Care Physician: Idelle Crouch, MD Location: [facility] [place]  Outpatient Pain Management Facility Note by: Gillis Santa, MD Date: 06/15/2017; Time: 12:12 PM  Disclaimer:  Medicine is not an exact science. The only guarantee in medicine is that nothing is guaranteed. It is important to note that the decision to proceed with this intervention was based on the information collected from the patient. The Data and conclusions were drawn from the patient's questionnaire, the interview, and the physical examination. Because the information was provided in large part by the patient, it cannot be guaranteed that it has not been purposely or unconsciously manipulated. Every effort has been made to obtain as much relevant data as possible for this evaluation. It is important to note that the conclusions that lead to this procedure are derived in large part from the available data. Always take into account that the treatment will also be dependent on availability of resources and existing treatment guidelines, considered by other Pain Management Practitioners as being common knowledge and practice, at the time of the intervention. For Medico-Legal purposes, it is also important to point out that variation in procedural techniques and pharmacological choices are the acceptable norm. The indications, contraindications, technique, and results of the above procedure should only be interpreted and judged by a Board-Certified Interventional Pain Specialist with extensive familiarity and expertise in the same exact procedure and technique.

## 2017-06-15 NOTE — Patient Instructions (Signed)

## 2017-06-16 ENCOUNTER — Telehealth: Payer: Self-pay | Admitting: *Deleted

## 2017-06-16 NOTE — Telephone Encounter (Signed)
Attempted to call for post procedure follow-up. Message left. 

## 2017-07-13 ENCOUNTER — Ambulatory Visit: Payer: Medicare Other | Admitting: Student in an Organized Health Care Education/Training Program

## 2017-08-04 ENCOUNTER — Ambulatory Visit
Payer: Medicare Other | Attending: Student in an Organized Health Care Education/Training Program | Admitting: Student in an Organized Health Care Education/Training Program

## 2017-08-04 ENCOUNTER — Encounter: Payer: Self-pay | Admitting: Student in an Organized Health Care Education/Training Program

## 2017-08-04 ENCOUNTER — Other Ambulatory Visit: Payer: Self-pay

## 2017-08-04 VITALS — BP 162/95 | HR 82 | Temp 98.3°F | Resp 16 | Ht 65.0 in | Wt 180.0 lb

## 2017-08-04 DIAGNOSIS — I6529 Occlusion and stenosis of unspecified carotid artery: Secondary | ICD-10-CM | POA: Diagnosis not present

## 2017-08-04 DIAGNOSIS — I10 Essential (primary) hypertension: Secondary | ICD-10-CM | POA: Diagnosis not present

## 2017-08-04 DIAGNOSIS — M5106 Intervertebral disc disorders with myelopathy, lumbar region: Secondary | ICD-10-CM | POA: Diagnosis not present

## 2017-08-04 DIAGNOSIS — Z87891 Personal history of nicotine dependence: Secondary | ICD-10-CM | POA: Insufficient documentation

## 2017-08-04 DIAGNOSIS — M5416 Radiculopathy, lumbar region: Secondary | ICD-10-CM

## 2017-08-04 DIAGNOSIS — Z7982 Long term (current) use of aspirin: Secondary | ICD-10-CM | POA: Insufficient documentation

## 2017-08-04 DIAGNOSIS — Z79899 Other long term (current) drug therapy: Secondary | ICD-10-CM | POA: Diagnosis not present

## 2017-08-04 DIAGNOSIS — M5116 Intervertebral disc disorders with radiculopathy, lumbar region: Secondary | ICD-10-CM | POA: Diagnosis not present

## 2017-08-04 DIAGNOSIS — E119 Type 2 diabetes mellitus without complications: Secondary | ICD-10-CM | POA: Diagnosis not present

## 2017-08-04 DIAGNOSIS — G894 Chronic pain syndrome: Secondary | ICD-10-CM | POA: Diagnosis present

## 2017-08-04 DIAGNOSIS — E785 Hyperlipidemia, unspecified: Secondary | ICD-10-CM | POA: Diagnosis not present

## 2017-08-04 DIAGNOSIS — K219 Gastro-esophageal reflux disease without esophagitis: Secondary | ICD-10-CM | POA: Diagnosis not present

## 2017-08-04 DIAGNOSIS — M5136 Other intervertebral disc degeneration, lumbar region: Secondary | ICD-10-CM | POA: Diagnosis not present

## 2017-08-04 DIAGNOSIS — M48062 Spinal stenosis, lumbar region with neurogenic claudication: Secondary | ICD-10-CM | POA: Diagnosis not present

## 2017-08-04 NOTE — Progress Notes (Signed)
Safety precautions to be maintained throughout the outpatient stay will include: orient to surroundings, keep bed in low position, maintain call bell within reach at all times, provide assistance with transfer out of bed and ambulation.  

## 2017-08-04 NOTE — Progress Notes (Signed)
Patient's Name: Blake [name]  MRN: [id number]  Referring Provider: Idelle Crouch, MD  DOB: 1945/04/26  PCP: Idelle Crouch, MD  DOS: 08/04/2017  Note by: Gillis Santa, MD  Service setting: Ambulatory outpatient  Specialty: Interventional Pain Management  Location: [facility] (AMB) Pain Management Facility    Patient type: Established   Primary Reason(s) for Visit: Encounter for post-procedure evaluation of chronic illness with mild to moderate exacerbation CC: Back Pain (lower)  HPI  Blake [name] is a 72 y.o. year old, male patient, who comes today for a post-procedure evaluation. He has Allergic rhinitis; Deficiency, disaccharidase intestinal; Diabetes mellitus ([place]); Acid reflux; Essential (primary) hypertension; Family history of cancer of digestive system; Personal history of malignant neoplasm of tongue; HLD (hyperlipidemia); Gastric polyposis; Carotid stenosis; Lumbar radiculopathy; Lumbar degenerative disc disease; and Spinal stenosis, lumbar region, with neurogenic claudication on their problem list. His primarily concern today is the Back Pain (lower)  Pain Assessment: Location: Lower, Left, Right Back Radiating: radiates down backs of both legs to heels Onset:   Duration: Chronic pain Quality: Constant, Discomfort, Burning Severity: 8 /10 (subjective, self-reported pain score)  Note: Reported level is inconsistent with clinical observations. Clinically the patient looks like a 3/10 A 4/10 is viewed as "Moderately Severe" and described as impossible to ignore for more than a few minutes. With effort, patients may still be able to manage work or participate in some social activities. Very difficult to concentrate. Signs of autonomic nervous system discharge are evident: dilated pupils (mydriasis); mild sweating (diaphoresis); sleep interference. Heart rate becomes elevated (>115 bpm). Diastolic blood pressure (lower number) rises above 100 mmHg. Patients find relief in laying down and not  moving.       When using our objective Pain Scale, levels between 6 and 10/10 are said to belong in an emergency room, as it progressively worsens from a 6/10, described as severely limiting, requiring emergency care not usually available at an outpatient pain management facility. At a 6/10 level, communication becomes difficult and requires great effort. Assistance to reach the emergency department may be required. Facial flushing and profuse sweating along with potentially dangerous increases in heart rate and blood pressure will be evident. Effect on ADL: no pain until walking for extended periods of time Timing:   Modifying factors: procedures, muscle relaxers prn, rest BP: (!) 162/95  HR: 82  Blake [name] comes in today for post-procedure evaluation after the treatment done on 06/15/2017.  Further details on both, my assessment(s), as well as the proposed treatment plan, please see below.  Post-Procedure Assessment  06/15/2017 Procedure: L4/5 ESI #2.  Pre-procedure pain score:  5/10 Post-procedure pain score: 0/10         Influential Factors: BMI: 29.95 kg/m Intra-procedural challenges: None observed.         Assessment challenges: None detected.              Reported side-effects: None.        Post-procedural adverse reactions or complications: None reported         Sedation: Please see nurses note. When no sedatives are used, the analgesic levels obtained are directly associated to the effectiveness of the local anesthetics. However, when sedation is provided, the level of analgesia obtained during the initial 1 hour following the intervention, is believed to be the result of a combination of factors. These factors may include, but are not limited to: 1. The effectiveness of the local anesthetics used. 2. The effects of the analgesic(s) and/or  anxiolytic(s) used. 3. The degree of discomfort experienced by the patient at the time of the procedure. 4. The patients ability and reliability  in recalling and recording the events. 5. The presence and influence of possible secondary gains and/or psychosocial factors. Reported result: Relief experienced during the 1st hour after the procedure: 60 % (Ultra-Short Term Relief)            Interpretative annotation: Clinically appropriate result. Analgesia during this period is likely to be Local Anesthetic and/or IV Sedative (Analgesic/Anxiolytic) related.          Effects of local anesthetic: The analgesic effects attained during this period are directly associated to the localized infiltration of local anesthetics and therefore cary significant diagnostic value as to the etiological location, or anatomical origin, of the pain. Expected duration of relief is directly dependent on the pharmacodynamics of the local anesthetic used. Long-acting (4-6 hours) anesthetics used.  Reported result: Relief during the next 4 to 6 hour after the procedure: 60 % (Short-Term Relief)            Interpretative annotation: Clinically appropriate result. Analgesia during this period is likely to be Local Anesthetic-related.          Long-term benefit: Defined as the period of time past the expected duration of local anesthetics (1 hour for short-acting and 4-6 hours for long-acting). With the possible exception of prolonged sympathetic blockade from the local anesthetics, benefits during this period are typically attributed to, or associated with, other factors such as analgesic sensory neuropraxia, antiinflammatory effects, or beneficial biochemical changes provided by agents other than the local anesthetics.  Reported result: Extended relief following procedure: 60 %(Pt stateslevel of pain is irectly affected by activity level day to day.) (Long-Term Relief)            Interpretative annotation: Clinically appropriate result. Good relief. No permanent benefit expected. Inflammation plays a part in the etiology to the pain.          Current benefits: Defined as  reported results that persistent at this point in time.   Analgesia: 50-75 %            Function: Somewhat improved ROM: Somewhat improved Interpretative annotation: Recurrence of symptoms. Therapeutic benefit observed. Effective therapeutic approach.         Patient notes greater pain relief when he is sedentary than  when he is active.  Interpretation: Results would suggest a successful diagnostic and therapeutic intervention.                  Plan:  Repeat treatment or therapy and compare extent and duration of benefits. # 3          Laboratory Chemistry  Inflammation Markers (CRP: Acute Phase) (ESR: Chronic Phase) Lab Results  Component Value Date   ESRSEDRATE 1 01/12/2013                         Rheumatology Markers No results found for: RF, ANA, LABURIC, URICUR, LYMEIGGIGMAB, LYMEABIGMQN                      Renal Function Markers Lab Results  Component Value Date   BUN 19 (H) 01/26/2013   CREATININE 1.18 01/26/2013   GFRAA >60 01/26/2013   GFRNONAA >60 01/26/2013  Hepatic Function Markers Lab Results  Component Value Date   AST 18 08/11/2011   ALT 35 08/11/2011   ALBUMIN 3.8 08/11/2011   ALKPHOS 75 08/11/2011                        Electrolytes Lab Results  Component Value Date   NA 130 (L) 01/26/2013   K 4.6 01/26/2013   CL 101 01/26/2013   CALCIUM 8.3 (L) 01/26/2013                        Neuropathy Markers Lab Results  Component Value Date   HGBA1C 6.9 (H) 01/25/2013                        Bone Pathology Markers No results found for: [place], [id number], GB1517OH6, [id number], 25OHVITD1, 25OHVITD2, 25OHVITD3, TESTOFREE, TESTOSTERONE                       Coagulation Parameters Lab Results  Component Value Date   INR 0.9 01/12/2013   LABPROT 12.0 01/12/2013   APTT 29.5 01/12/2013   PLT 220 01/26/2013                        Cardiovascular Markers Lab Results  Component Value Date   CKTOTAL 483 (H) 01/25/2013    CKMB 4.7 (H) 01/25/2013   TROPONINI < 0.02 01/25/2013   HGB 11.8 (L) 01/26/2013   HCT 47.5 01/12/2013                         CA Markers No results found for: CEA, CA125, LABCA2                      Note: Lab results reviewed.  Recent Diagnostic Imaging Results  DG C-Arm 1-60 Min-No Report Fluoroscopy was utilized by the requesting physician.  No radiographic  interpretation.   Complexity Note: Imaging results reviewed. Results shared with Mr. Burch, using Layman's terms.                         Meds   Current Outpatient Medications:  .  Aspirin Buf,CaCarb-MgCarb-MgO, (BUFFERED ASPIRIN) 325 MG TABS, Take by mouth., Disp: , Rfl:  .  atorvastatin (LIPITOR) 80 MG tablet, , Disp: , Rfl: 2 .  Blood Glucose Monitoring Suppl (ONE TOUCH ULTRA SYSTEM KIT) w/Device KIT, , Disp: , Rfl:  .  co-enzyme Q-10 30 MG capsule, Take 30 mg by mouth daily., Disp: , Rfl:  .  lisinopril (PRINIVIL,ZESTRIL) 2.5 MG tablet, , Disp: , Rfl:  .  metoprolol succinate (TOPROL-XL) 50 MG 24 hr tablet, Take 50 mg by mouth daily. , Disp: , Rfl:  .  omega-3 acid ethyl esters (LOVAZA) 1 g capsule, Take by mouth., Disp: , Rfl:  .  omeprazole (PRILOSEC) 40 MG capsule, , Disp: , Rfl: 3 .  pravastatin (PRAVACHOL) 80 MG tablet, Take by mouth., Disp: , Rfl:  .  ranitidine (ZANTAC) 300 MG tablet, Take by mouth., Disp: , Rfl:  .  tamsulosin (FLOMAX) 0.4 MG CAPS capsule, TAKE 1 CAPSULE BY MOUTH ONCE DAILY. TAKE 30 MINUTES AFTER SAME MEAL EACH DAY., Disp: , Rfl:  .  b complex vitamins tablet, Take 1 tablet by mouth daily., Disp: , Rfl:  .  lisinopril (PRINIVIL,ZESTRIL) 20 MG tablet, Take 20  mg by mouth daily. , Disp: , Rfl:  .  Multiple Vitamin tablet, , Disp: , Rfl:  .  polyethylene glycol powder (GLYCOLAX/MIRALAX) powder, 255 grams one bottle for colonoscopy prep (Patient not taking: Reported on 08/04/2017), Disp: 255 g, Rfl: 0  ROS  Constitutional: Denies any fever or chills Gastrointestinal: No reported hemesis,  hematochezia, vomiting, or acute GI distress Musculoskeletal: Denies any acute onset joint swelling, redness, loss of ROM, or weakness Neurological: No reported episodes of acute onset apraxia, aphasia, dysarthria, agnosia, amnesia, paralysis, loss of coordination, or loss of consciousness  Allergies  Mr. Kardell is allergic to sulfa antibiotics.  [place]  Drug: Mr. Pauwels  reports that he does not use drugs. Alcohol:  reports that he drinks alcohol. Tobacco:  reports that he has quit smoking. His smoking use included cigarettes. He has a 30.00 pack-year smoking history. He has never used smokeless tobacco. Medical:  has a past medical history of GERD (gastroesophageal reflux disease), Hyperlipidemia, and Hypertension. Surgical: Mr. Branscome  has a past surgical history that includes Partial hip arthroplasty (2014); Tongue Biopsy (2006); Port-a-cath removal; Colonoscopy (12/08/2003); Tonsillectomy; and Upper gi endoscopy (01/25/14). Family: family history includes Diabetes in his mother; Lung cancer in his father.  Constitutional Exam  General appearance: Well nourished, well developed, and well hydrated. In no apparent acute distress Vitals:   08/04/17 1135  BP: (!) 162/95  Pulse: 82  Resp: 16  Temp: 98.3 F (36.8 C)  TempSrc: Oral  SpO2: 98%  Weight: 180 lb (81.6 kg)  Height: _0  (1.651 m)   BMI Assessment: Estimated body mass index is 29.95 kg/m as calculated from the following:   Height as of this encounter: _1  (1.651 m).   Weight as of this encounter: 180 lb (81.6 kg).  BMI interpretation table: BMI level Category Range association with higher incidence of chronic pain  <18 kg/m2 Underweight   18.5-24.9 kg/m2 Ideal body weight   25-29.9 kg/m2 Overweight Increased incidence by 20%  30-34.9 kg/m2 Obese (Class I) Increased incidence by 68%  35-39.9 kg/m2 Severe obesity (Class II) Increased incidence by 136%  >40 kg/m2 Extreme obesity (Class III) Increased incidence by 254%    Patient's current BMI Ideal Body weight  Body mass index is 29.95 kg/m. Ideal body weight: 61.5 kg (135 lb 9.3 oz) Adjusted ideal body weight: 69.6 kg (153 lb 5.6 oz)   BMI Readings from Last 4 Encounters:  08/04/17 29.95 kg/m  06/15/17 29.95 kg/m  06/02/17 29.95 kg/m  04/27/17 29.05 kg/m   Wt Readings from Last 4 Encounters:  08/04/17 180 lb (81.6 kg)  06/15/17 180 lb (81.6 kg)  06/02/17 180 lb (81.6 kg)  04/27/17 180 lb (81.6 kg)  Psych/Mental status: Alert, oriented x 3 (person, place, & time)       Eyes: PERLA Respiratory: No evidence of acute respiratory distress  Cervical Spine Area Exam  Skin & Axial Inspection: No masses, redness, edema, swelling, or associated skin lesions Alignment: Symmetrical Functional ROM: Unrestricted ROM      Stability: No instability detected Muscle Tone/Strength: Functionally intact. No obvious neuro-muscular anomalies detected. Sensory (Neurological): Unimpaired Palpation: No palpable anomalies              Upper Extremity (UE) Exam    Side: Right upper extremity  Side: Left upper extremity  Skin & Extremity Inspection: Skin color, temperature, and hair growth are WNL. No peripheral edema or cyanosis. No masses, redness, swelling, asymmetry, or associated skin lesions. No contractures.  Skin &  Extremity Inspection: Skin color, temperature, and hair growth are WNL. No peripheral edema or cyanosis. No masses, redness, swelling, asymmetry, or associated skin lesions. No contractures.  Functional ROM: Unrestricted ROM          Functional ROM: Unrestricted ROM          Muscle Tone/Strength: Functionally intact. No obvious neuro-muscular anomalies detected.  Muscle Tone/Strength: Functionally intact. No obvious neuro-muscular anomalies detected.  Sensory (Neurological): Unimpaired          Sensory (Neurological): Unimpaired          Palpation: No palpable anomalies              Palpation: No palpable anomalies              Specialized Test(s):  Deferred         Specialized Test(s): Deferred          Thoracic Spine Area Exam  Skin & Axial Inspection: No masses, redness, or swelling Alignment: Symmetrical Functional ROM: Unrestricted ROM Stability: No instability detected Muscle Tone/Strength: Functionally intact. No obvious neuro-muscular anomalies detected. Sensory (Neurological): Unimpaired Muscle strength & Tone: No palpable anomalies  Lumbar Spine Area Exam  Skin & Axial Inspection: No masses, redness, or swelling Alignment: Symmetrical Functional ROM: Unrestricted ROM       Stability: No instability detected Muscle Tone/Strength: Functionally intact. No obvious neuro-muscular anomalies detected. Sensory (Neurological): Dermatomal pain pattern Palpation: No palpable anomalies       Provocative Tests: Lumbar Hyperextension and rotation test: evaluation deferred today       Lumbar Lateral bending test: Positive ipsilateral radicular pain, bilaterally. Positive for bilateral foraminal stenosis. Patrick's Maneuver: evaluation deferred today                    Gait & Posture Assessment  Ambulation: Unassisted Gait: Relatively normal for age and body habitus Posture: WNL   Lower Extremity Exam    Side: Right lower extremity  Side: Left lower extremity  Stability: No instability observed          Stability: No instability observed          Skin & Extremity Inspection: Skin color, temperature, and hair growth are WNL. No peripheral edema or cyanosis. No masses, redness, swelling, asymmetry, or associated skin lesions. No contractures.  Skin & Extremity Inspection: Skin color, temperature, and hair growth are WNL. No peripheral edema or cyanosis. No masses, redness, swelling, asymmetry, or associated skin lesions. No contractures.  Functional ROM: Unrestricted ROM                  Functional ROM: Unrestricted ROM                  Muscle Tone/Strength: Functionally intact. No obvious neuro-muscular anomalies detected.  Muscle  Tone/Strength: Functionally intact. No obvious neuro-muscular anomalies detected.  Sensory (Neurological): Unimpaired  Sensory (Neurological): Unimpaired  Palpation: No palpable anomalies  Palpation: No palpable anomalies   Assessment  Primary Diagnosis & Pertinent Problem List: The primary encounter diagnosis was Lumbar radiculopathy. Diagnoses of Lumbar degenerative disc disease, Spinal stenosis, lumbar region, with neurogenic claudication, and Chronic pain syndrome were also pertinent to this visit.  Status Diagnosis  Responding Persistent Persistent 1. Lumbar radiculopathy   2. Lumbar degenerative disc disease   3. Spinal stenosis, lumbar region, with neurogenic claudication   4. Chronic pain syndrome     General Recommendations: The pain condition that the patient suffers from is best treated with a multidisciplinary approach  that involves an increase in physical activity to prevent de-conditioning and worsening of the pain cycle, as well as psychological counseling (formal and/or informal) to address the co-morbid psychological affects of pain. Treatment will often involve judicious use of pain medications and interventional procedures to decrease the pain, allowing the patient to participate in the physical activity that will ultimately produce long-lasting pain reductions. The goal of the multidisciplinary approach is to return the patient to a higher level of overall function and to restore their ability to perform activities of daily living.  72 year old male with a history of lumbar radiculopathy status post L4/L5 epidural steroid injections on 04/17/2017, 06/15/2017.  Patient returns for follow-up after his second epidural steroid injection.  Patient states that he has noticed significant pain relief while he is stationary.  He states that the pain relief has not been as significant when he is mobile and exerting himself.  He states that overall his radicular pain has improved since  starting the epidural steroid injections.  This last weekend, the patient was at a convention at [employer] and was able to dance and also spent time with friends.  Yesterday he worked in his yard and is endorsing worsening axial low back muscle pain today.  We discussed repeating lumbar epidural steroid injection #3.  Risks and benefits of the procedure were discussed.  Patient instructed to stop aspirin 325 mg 7 days prior to scheduled procedure to take 81 mg of aspirin instead.  Plan of Care   Lab-work, procedure(s), and/or referral(s): Orders Placed This Encounter  Procedures  . Lumbar Epidural Injection    Provider-requested follow-up: Return in about 2 weeks (around 08/18/2017) for Procedure.  Time Note: Greater than 50% of the 25 minute(s) of face-to-face time spent with Mr. Mcnulty, was spent in counseling/coordination of care regarding: Mr. Wombles primary cause of pain, the treatment plan, treatment alternatives, the risks and possible complications of proposed treatment, going over the informed consent, the results, interpretation and significance of  his recent diagnostic interventional treatment(s) and realistic expectations.  Future Appointments  Date Time Provider [place]  08/17/2017 11:45 AM Gillis Santa, MD [facility] None  10/02/2017 10:30 AM [facility] None  10/02/2017 11:30 AM Dew, Erskine Squibb, MD [facility] None    Primary Care Physician: Idelle Crouch, MD Location: [facility] Note by: Gillis Santa, M.D Date: 08/04/2017; Time: 12:24 PM  Patient Instructions   GENERAL RISKS AND COMPLICATIONS  What are the risk, side effects and possible complications? Generally speaking, most procedures are safe.  However, with any procedure there are risks, side effects, and the possibility of complications.  The risks and complications are dependent upon the sites that are lesioned, or the type of nerve block to be performed.  The closer the  procedure is to the spine, the more serious the risks are.  Great care is taken when placing the radio frequency needles, block needles or lesioning probes, but sometimes complications can occur. 1. Infection: Any time there is an injection through the skin, there is a risk of infection.  This is why sterile conditions are used for these blocks.  There are four possible types of infection. 1. Localized skin infection. 2. Central Nervous System Infection-This can be in the form of Meningitis, which can be deadly. 3. Epidural Infections-This can be in the form of an epidural abscess, which can cause pressure inside of the spine, causing compression of the spinal cord with subsequent paralysis. This would require  an emergency surgery to decompress, and there are no guarantees that the patient would recover from the paralysis. 4. Discitis-This is an infection of the intervertebral discs.  It occurs in about 1% of discography procedures.  It is difficult to treat and it may lead to surgery.        2. Pain: the needles have to go through skin and soft tissues, will cause soreness.       3. Damage to internal structures:  The nerves to be lesioned may be near blood vessels or    other nerves which can be potentially damaged.       4. Bleeding: Bleeding is more common if the patient is taking blood thinners such as  aspirin, Coumadin, Ticiid, Plavix, etc., or if he/she have some genetic predisposition  such as hemophilia. Bleeding into the spinal canal can cause compression of the spinal  cord with subsequent paralysis.  This would require an emergency surgery to  decompress and there are no guarantees that the patient would recover from the  paralysis.       5. Pneumothorax:  Puncturing of a lung is a possibility, every time a needle is introduced in  the area of the chest or upper back.  Pneumothorax refers to free air around the  collapsed lung(s), inside of the thoracic cavity (chest cavity).  Another two  possible  complications related to a similar event would include: Hemothorax and Chylothorax.   These are variations of the Pneumothorax, where instead of air around the collapsed  lung(s), you may have blood or chyle, respectively.       6. Spinal headaches: They may occur with any procedures in the area of the spine.       7. Persistent CSF (Cerebro-Spinal Fluid) leakage: This is a rare problem, but may occur  with prolonged intrathecal or epidural catheters either due to the formation of a fistulous  track or a dural tear.       8. Nerve damage: By working so close to the spinal cord, there is always a possibility of  nerve damage, which could be as serious as a permanent spinal cord injury with  paralysis.       9. Death:  Although rare, severe deadly allergic reactions known as "Anaphylactic  reaction" can occur to any of the medications used.      10. Worsening of the symptoms:  We can always make thing worse.  What are the chances of something like this happening? Chances of any of this occuring are extremely low.  By statistics, you have more of a chance of getting killed in a motor vehicle accident: while driving to the hospital than any of the above occurring .  Nevertheless, you should be aware that they are possibilities.  In general, it is similar to taking a shower.  Everybody knows that you can slip, hit your head and get killed.  Does that mean that you should not shower again?  Nevertheless always keep in mind that statistics do not mean anything if you happen to be on the wrong side of them.  Even if a procedure has a 1 (one) in a 1,000,000 (million) chance of going wrong, it you happen to be that one..Also, keep in mind that by statistics, you have more of a chance of having something go wrong when taking medications.  Who should not have this procedure? If you are on a blood thinning medication (e.g. Coumadin, Plavix, see list of "Blood Thinners"),  or if you have an active infection  going on, you should not have the procedure.  If you are taking any blood thinners, please inform your physician.  How should I prepare for this procedure?  Do not eat or drink anything at least six hours prior to the procedure.  Bring a driver with you .  It cannot be a taxi.  Come accompanied by an adult that can drive you back, and that is strong enough to help you if your legs get weak or numb from the local anesthetic.  Take all of your medicines the morning of the procedure with just enough water to swallow them.  If you have diabetes, make sure that you are scheduled to have your procedure done first thing in the morning, whenever possible.  If you have diabetes, take only half of your insulin dose and notify our nurse that you have done so as soon as you arrive at the clinic.  If you are diabetic, but only take blood sugar pills (oral hypoglycemic), then do not take them on the morning of your procedure.  You may take them after you have had the procedure.  Do not take aspirin or any aspirin-containing medications, at least eleven (11) days prior to the procedure.  They may prolong bleeding.  Wear loose fitting clothing that may be easy to take off and that you would not mind if it got stained with Betadine or blood.  Do not wear any jewelry or perfume  Remove any nail coloring.  It will interfere with some of our monitoring equipment.  NOTE: Remember that this is not meant to be interpreted as a complete list of all possible complications.  Unforeseen problems may occur.  BLOOD THINNERS The following drugs contain aspirin or other products, which can cause increased bleeding during surgery and should not be taken for 2 weeks prior to and 1 week after surgery.  If you should need take something for relief of minor pain, you may take acetaminophen which is found in Tylenol,m Datril, Anacin-3 and Panadol. It is not blood thinner. The products listed below are.  Do not take any of  the products listed below in addition to any listed on your instruction sheet.  A.P.C or A.P.C with Codeine Codeine Phosphate Capsules #3 Ibuprofen Ridaura  ABC compound Congesprin Imuran rimadil  Advil Cope Indocin Robaxisal  Alka-Seltzer Effervescent Pain Reliever and Antacid Coricidin or Coricidin-D  Indomethacin Rufen  Alka-Seltzer plus Cold Medicine Cosprin Ketoprofen S-A-C Tablets  Anacin Analgesic Tablets or Capsules Coumadin Korlgesic Salflex  Anacin Extra Strength Analgesic tablets or capsules CP-2 Tablets Lanoril Salicylate  Anaprox Cuprimine Capsules Levenox Salocol  Anexsia-D Dalteparin Magan Salsalate  Anodynos Darvon compound Magnesium Salicylate Sine-off  Ansaid Dasin Capsules Magsal Sodium Salicylate  Anturane Depen Capsules Marnal Soma  APF Arthritis pain formula Dewitt's Pills Measurin Stanback  Argesic Dia-Gesic Meclofenamic Sulfinpyrazone  Arthritis Bayer Timed Release Aspirin Diclofenac Meclomen Sulindac  Arthritis pain formula Anacin Dicumarol Medipren Supac  Analgesic (Safety coated) Arthralgen Diffunasal Mefanamic Suprofen  Arthritis Strength Bufferin Dihydrocodeine Mepro Compound Suprol  Arthropan liquid Dopirydamole Methcarbomol with Aspirin Synalgos  ASA tablets/Enseals Disalcid Micrainin Tagament  Ascriptin Doan's Midol Talwin  Ascriptin A/D Dolene Mobidin Tanderil  Ascriptin Extra Strength Dolobid Moblgesic Ticlid  Ascriptin with Codeine Doloprin or Doloprin with Codeine Momentum Tolectin  Asperbuf Duoprin Mono-gesic Trendar  Aspergum Duradyne Motrin or Motrin IB Triminicin  Aspirin plain, buffered or enteric coated Durasal Myochrisine Trigesic  Aspirin Suppositories Easprin Nalfon Trillsate  Aspirin with Codeine Ecotrin Regular or Extra Strength Naprosyn Uracel  Atromid-S Efficin Naproxen Ursinus  Auranofin Capsules Elmiron Neocylate Vanquish  Axotal Emagrin Norgesic Verin  Azathioprine Empirin or Empirin with Codeine Normiflo Vitamin E  Azolid  Emprazil Nuprin Voltaren  Bayer Aspirin plain, buffered or children's or timed BC Tablets or powders Encaprin Orgaran Warfarin Sodium  Buff-a-Comp Enoxaparin Orudis Zorpin  Buff-a-Comp with Codeine Equegesic Os-Cal-Gesic   Buffaprin Excedrin plain, buffered or Extra Strength Oxalid   Bufferin Arthritis Strength Feldene Oxphenbutazone   Bufferin plain or Extra Strength Feldene Capsules Oxycodone with Aspirin   Bufferin with Codeine Fenoprofen Fenoprofen Pabalate or Pabalate-SF   Buffets II Flogesic Panagesic   Buffinol plain or Extra Strength Florinal or Florinal with Codeine Panwarfarin   Buf-Tabs Flurbiprofen Penicillamine   Butalbital Compound Four-way cold tablets Penicillin   Butazolidin Fragmin Pepto-Bismol   Carbenicillin Geminisyn Percodan   Carna Arthritis Reliever Geopen Persantine   Carprofen Gold's salt Persistin   Chloramphenicol Goody's Phenylbutazone   Chloromycetin Haltrain Piroxlcam   Clmetidine heparin Plaquenil   Cllnoril Hyco-pap Ponstel   Clofibrate Hydroxy chloroquine Propoxyphen         Before stopping any of these medications, be sure to consult the physician who ordered them.  Some, such as Coumadin (Warfarin) are ordered to prevent or treat serious conditions such as "deep thrombosis", "pumonary embolisms", and other heart problems.  The amount of time that you may need off of the medication may also vary with the medication and the reason for which you were taking it.  If you are taking any of these medications, please make sure you notify your pain physician before you undergo any procedures.         Epidural Steroid Injection Patient Information  Description: The epidural space surrounds the nerves as they exit the spinal cord.  In some patients, the nerves can be compressed and inflamed by a bulging disc or a tight spinal canal (spinal stenosis).  By injecting steroids into the epidural space, we can bring irritated nerves into direct contact with a  potentially helpful medication.  These steroids act directly on the irritated nerves and can reduce swelling and inflammation which often leads to decreased pain.  Epidural steroids may be injected anywhere along the spine and from the neck to the low back depending upon the location of your pain.   After numbing the skin with local anesthetic (like Novocaine), a small needle is passed into the epidural space slowly.  You may experience a sensation of pressure while this is being done.  The entire block usually last less than 10 minutes.  Conditions which may be treated by epidural steroids:   Low back and leg pain  Neck and arm pain  Spinal stenosis  Post-laminectomy syndrome  Herpes zoster (shingles) pain  Pain from compression fractures  Preparation for the injection:  1. Do not eat any solid food or dairy products within 8 hours of your appointment.  2. You may drink clear liquids up to 3 hours before appointment.  Clear liquids include water, black coffee, juice or soda.  No milk or cream please. 3. You may take your regular medication, including pain medications, with a sip of water before your appointment  Diabetics should hold regular insulin (if taken separately) and take 1/2 normal NPH dos the morning of the procedure.  Carry some sugar containing items with you to your appointment. 4. A driver must accompany you and be prepared to drive you home after your  procedure.  5. Bring all your current medications with your. 6. An IV may be inserted and sedation may be given at the discretion of the physician.   7. A blood pressure cuff, EKG and other monitors will often be applied during the procedure.  Some patients may need to have extra oxygen administered for a short period. 8. You will be asked to provide medical information, including your allergies, prior to the procedure.  We must know immediately if you are taking blood thinners (like Coumadin/Warfarin)  Or if you are allergic  to IV iodine contrast (dye). We must know if you could possible be pregnant.  Possible side-effects:  Bleeding from needle site  Infection (rare, may require surgery)  Nerve injury (rare)  Numbness & tingling (temporary)  Difficulty urinating (rare, temporary)  Spinal headache ( a headache worse with upright posture)  Light -headedness (temporary)  Pain at injection site (several days)  Decreased blood pressure (temporary)  Weakness in arm/leg (temporary)  Pressure sensation in back/neck (temporary)  Call if you experience:  Fever/chills associated with headache or increased back/neck pain.  Headache worsened by an upright position.  New onset weakness or numbness of an extremity below the injection site  Hives or difficulty breathing (go to the emergency room)  Inflammation or drainage at the infection site  Severe back/neck pain  Any new symptoms which are concerning to you  Please note:  Although the local anesthetic injected can often make your back or neck feel good for several hours after the injection, the pain will likely return.  It takes 3-7 days for steroids to work in the epidural space.  You may not notice any pain relief for at least that one week.  If effective, we will often do a series of three injections spaced 3-6 weeks apart to maximally decrease your pain.  After the initial series, we generally will wait several months before considering a repeat injection of the same type.  If you have any questions, please call [phone] [facility]

## 2017-08-04 NOTE — Patient Instructions (Signed)
GENERAL RISKS AND COMPLICATIONS  What are the risk, side effects and possible complications? Generally speaking, most procedures are safe.  However, with any procedure there are risks, side effects, and the possibility of complications.  The risks and complications are dependent upon the sites that are lesioned, or the type of nerve block to be performed.  The closer the procedure is to the spine, the more serious the risks are.  Great care is taken when placing the radio frequency needles, block needles or lesioning probes, but sometimes complications can occur. 1. Infection: Any time there is an injection through the skin, there is a risk of infection.  This is why sterile conditions are used for these blocks.  There are four possible types of infection. 1. Localized skin infection. 2. Central Nervous System Infection-This can be in the form of Meningitis, which can be deadly. 3. Epidural Infections-This can be in the form of an epidural abscess, which can cause pressure inside of the spine, causing compression of the spinal cord with subsequent paralysis. This would require an emergency surgery to decompress, and there are no guarantees that the patient would recover from the paralysis. 4. Discitis-This is an infection of the intervertebral discs.  It occurs in about 1% of discography procedures.  It is difficult to treat and it may lead to surgery.        2. Pain: the needles have to go through skin and soft tissues, will cause soreness.       3. Damage to internal structures:  The nerves to be lesioned may be near blood vessels or    other nerves which can be potentially damaged.       4. Bleeding: Bleeding is more common if the patient is taking blood thinners such as  aspirin, Coumadin, Ticiid, Plavix, etc., or if he/she have some genetic predisposition  such as hemophilia. Bleeding into the spinal canal can cause compression of the spinal  cord with subsequent paralysis.  This would require an  emergency surgery to  decompress and there are no guarantees that the patient would recover from the  paralysis.       5. Pneumothorax:  Puncturing of a lung is a possibility, every time a needle is introduced in  the area of the chest or upper back.  Pneumothorax refers to free air around the  collapsed lung(s), inside of the thoracic cavity (chest cavity).  Another two possible  complications related to a similar event would include: Hemothorax and Chylothorax.   These are variations of the Pneumothorax, where instead of air around the collapsed  lung(s), you may have blood or chyle, respectively.       6. Spinal headaches: They may occur with any procedures in the area of the spine.       7. Persistent CSF (Cerebro-Spinal Fluid) leakage: This is a rare problem, but may occur  with prolonged intrathecal or epidural catheters either due to the formation of a fistulous  track or a dural tear.       8. Nerve damage: By working so close to the spinal cord, there is always a possibility of  nerve damage, which could be as serious as a permanent spinal cord injury with  paralysis.       9. Death:  Although rare, severe deadly allergic reactions known as "Anaphylactic  reaction" can occur to any of the medications used.      10. Worsening of the symptoms:  We can always make thing worse.    What are the chances of something like this happening? Chances of any of this occuring are extremely low.  By statistics, you have more of a chance of getting killed in a motor vehicle accident: while driving to the hospital than any of the above occurring .  Nevertheless, you should be aware that they are possibilities.  In general, it is similar to taking a shower.  Everybody knows that you can slip, hit your head and get killed.  Does that mean that you should not shower again?  Nevertheless always keep in mind that statistics do not mean anything if you happen to be on the wrong side of them.  Even if a procedure has a 1  (one) in a 1,000,000 (million) chance of going wrong, it you happen to be that one..Also, keep in mind that by statistics, you have more of a chance of having something go wrong when taking medications.  Who should not have this procedure? If you are on a blood thinning medication (e.g. Coumadin, Plavix, see list of "Blood Thinners"), or if you have an active infection going on, you should not have the procedure.  If you are taking any blood thinners, please inform your physician.  How should I prepare for this procedure?  Do not eat or drink anything at least six hours prior to the procedure.  Bring a driver with you .  It cannot be a taxi.  Come accompanied by an adult that can drive you back, and that is strong enough to help you if your legs get weak or numb from the local anesthetic.  Take all of your medicines the morning of the procedure with just enough water to swallow them.  If you have diabetes, make sure that you are scheduled to have your procedure done first thing in the morning, whenever possible.  If you have diabetes, take only half of your insulin dose and notify our nurse that you have done so as soon as you arrive at the clinic.  If you are diabetic, but only take blood sugar pills (oral hypoglycemic), then do not take them on the morning of your procedure.  You may take them after you have had the procedure.  Do not take aspirin or any aspirin-containing medications, at least eleven (11) days prior to the procedure.  They may prolong bleeding.  Wear loose fitting clothing that may be easy to take off and that you would not mind if it got stained with Betadine or blood.  Do not wear any jewelry or perfume  Remove any nail coloring.  It will interfere with some of our monitoring equipment.  NOTE: Remember that this is not meant to be interpreted as a complete list of all possible complications.  Unforeseen problems may occur.  BLOOD THINNERS The following drugs  contain aspirin or other products, which can cause increased bleeding during surgery and should not be taken for 2 weeks prior to and 1 week after surgery.  If you should need take something for relief of minor pain, you may take acetaminophen which is found in Tylenol,m Datril, Anacin-3 and Panadol. It is not blood thinner. The products listed below are.  Do not take any of the products listed below in addition to any listed on your instruction sheet.  A.P.C or A.P.C with Codeine Codeine Phosphate Capsules #3 Ibuprofen Ridaura  ABC compound Congesprin Imuran rimadil  Advil Cope Indocin Robaxisal  Alka-Seltzer Effervescent Pain Reliever and Antacid Coricidin or Coricidin-D  Indomethacin Rufen    Alka-Seltzer plus Cold Medicine Cosprin Ketoprofen S-A-C Tablets  Anacin Analgesic Tablets or Capsules Coumadin Korlgesic Salflex  Anacin Extra Strength Analgesic tablets or capsules CP-2 Tablets Lanoril Salicylate  Anaprox Cuprimine Capsules Levenox Salocol  Anexsia-D Dalteparin Magan Salsalate  Anodynos Darvon compound Magnesium Salicylate Sine-off  Ansaid Dasin Capsules Magsal Sodium Salicylate  Anturane Depen Capsules Marnal Soma  APF Arthritis pain formula Dewitt's Pills Measurin Stanback  Argesic Dia-Gesic Meclofenamic Sulfinpyrazone  Arthritis Bayer Timed Release Aspirin Diclofenac Meclomen Sulindac  Arthritis pain formula Anacin Dicumarol Medipren Supac  Analgesic (Safety coated) Arthralgen Diffunasal Mefanamic Suprofen  Arthritis Strength Bufferin Dihydrocodeine Mepro Compound Suprol  Arthropan liquid Dopirydamole Methcarbomol with Aspirin Synalgos  ASA tablets/Enseals Disalcid Micrainin Tagament  Ascriptin Doan's Midol Talwin  Ascriptin A/D Dolene Mobidin Tanderil  Ascriptin Extra Strength Dolobid Moblgesic Ticlid  Ascriptin with Codeine Doloprin or Doloprin with Codeine Momentum Tolectin  Asperbuf Duoprin Mono-gesic Trendar  Aspergum Duradyne Motrin or Motrin IB Triminicin  Aspirin  plain, buffered or enteric coated Durasal Myochrisine Trigesic  Aspirin Suppositories Easprin Nalfon Trillsate  Aspirin with Codeine Ecotrin Regular or Extra Strength Naprosyn Uracel  Atromid-S Efficin Naproxen Ursinus  Auranofin Capsules Elmiron Neocylate Vanquish  Axotal Emagrin Norgesic Verin  Azathioprine Empirin or Empirin with Codeine Normiflo Vitamin E  Azolid Emprazil Nuprin Voltaren  Bayer Aspirin plain, buffered or children's or timed BC Tablets or powders Encaprin Orgaran Warfarin Sodium  Buff-a-Comp Enoxaparin Orudis Zorpin  Buff-a-Comp with Codeine Equegesic Os-Cal-Gesic   Buffaprin Excedrin plain, buffered or Extra Strength Oxalid   Bufferin Arthritis Strength Feldene Oxphenbutazone   Bufferin plain or Extra Strength Feldene Capsules Oxycodone with Aspirin   Bufferin with Codeine Fenoprofen Fenoprofen Pabalate or Pabalate-SF   Buffets II Flogesic Panagesic   Buffinol plain or Extra Strength Florinal or Florinal with Codeine Panwarfarin   Buf-Tabs Flurbiprofen Penicillamine   Butalbital Compound Four-way cold tablets Penicillin   Butazolidin Fragmin Pepto-Bismol   Carbenicillin Geminisyn Percodan   Carna Arthritis Reliever Geopen Persantine   Carprofen Gold's salt Persistin   Chloramphenicol Goody's Phenylbutazone   Chloromycetin Haltrain Piroxlcam   Clmetidine heparin Plaquenil   Cllnoril Hyco-pap Ponstel   Clofibrate Hydroxy chloroquine Propoxyphen         Before stopping any of these medications, be sure to consult the physician who ordered them.  Some, such as Coumadin (Warfarin) are ordered to prevent or treat serious conditions such as "deep thrombosis", "pumonary embolisms", and other heart problems.  The amount of time that you may need off of the medication may also vary with the medication and the reason for which you were taking it.  If you are taking any of these medications, please make sure you notify your pain physician before you undergo any  procedures.         Epidural Steroid Injection Patient Information  Description: The epidural space surrounds the nerves as they exit the spinal cord.  In some patients, the nerves can be compressed and inflamed by a bulging disc or a tight spinal canal (spinal stenosis).  By injecting steroids into the epidural space, we can bring irritated nerves into direct contact with a potentially helpful medication.  These steroids act directly on the irritated nerves and can reduce swelling and inflammation which often leads to decreased pain.  Epidural steroids may be injected anywhere along the spine and from the neck to the low back depending upon the location of your pain.   After numbing the skin with local anesthetic (like Novocaine), a small needle is passed   into the epidural space slowly.  You may experience a sensation of pressure while this is being done.  The entire block usually last less than 10 minutes.  Conditions which may be treated by epidural steroids:   Low back and leg pain  Neck and arm pain  Spinal stenosis  Post-laminectomy syndrome  Herpes zoster (shingles) pain  Pain from compression fractures  Preparation for the injection:  1. Do not eat any solid food or dairy products within 8 hours of your appointment.  2. You may drink clear liquids up to 3 hours before appointment.  Clear liquids include water, black coffee, juice or soda.  No milk or cream please. 3. You may take your regular medication, including pain medications, with a sip of water before your appointment  Diabetics should hold regular insulin (if taken separately) and take 1/2 normal NPH dos the morning of the procedure.  Carry some sugar containing items with you to your appointment. 4. A driver must accompany you and be prepared to drive you home after your procedure.  5. Bring all your current medications with your. 6. An IV may be inserted and sedation may be given at the discretion of the  physician.   7. A blood pressure cuff, EKG and other monitors will often be applied during the procedure.  Some patients may need to have extra oxygen administered for a short period. 8. You will be asked to provide medical information, including your allergies, prior to the procedure.  We must know immediately if you are taking blood thinners (like Coumadin/Warfarin)  Or if you are allergic to IV iodine contrast (dye). We must know if you could possible be pregnant.  Possible side-effects:  Bleeding from needle site  Infection (rare, may require surgery)  Nerve injury (rare)  Numbness & tingling (temporary)  Difficulty urinating (rare, temporary)  Spinal headache ( a headache worse with upright posture)  Light -headedness (temporary)  Pain at injection site (several days)  Decreased blood pressure (temporary)  Weakness in arm/leg (temporary)  Pressure sensation in back/neck (temporary)  Call if you experience:  Fever/chills associated with headache or increased back/neck pain.  Headache worsened by an upright position.  New onset weakness or numbness of an extremity below the injection site  Hives or difficulty breathing (go to the emergency room)  Inflammation or drainage at the infection site  Severe back/neck pain  Any new symptoms which are concerning to you  Please note:  Although the local anesthetic injected can often make your back or neck feel good for several hours after the injection, the pain will likely return.  It takes 3-7 days for steroids to work in the epidural space.  You may not notice any pain relief for at least that one week.  If effective, we will often do a series of three injections spaced 3-6 weeks apart to maximally decrease your pain.  After the initial series, we generally will wait several months before considering a repeat injection of the same type.  If you have any questions, please call [phone] [facility] 

## 2017-08-17 ENCOUNTER — Ambulatory Visit (HOSPITAL_BASED_OUTPATIENT_CLINIC_OR_DEPARTMENT_OTHER): Payer: Medicare Other | Admitting: Student in an Organized Health Care Education/Training Program

## 2017-08-17 ENCOUNTER — Encounter: Payer: Self-pay | Admitting: Student in an Organized Health Care Education/Training Program

## 2017-08-17 ENCOUNTER — Ambulatory Visit
Admission: RE | Admit: 2017-08-17 | Discharge: 2017-08-17 | Disposition: A | Discharge status: Home / Self Care | Payer: Medicare Other | Source: Ambulatory Visit | Attending: Student in an Organized Health Care Education/Training Program | Admitting: Student in an Organized Health Care Education/Training Program

## 2017-08-17 VITALS — BP 140/71 | HR 69 | Temp 98.3°F | Resp 17 | Ht 65.0 in | Wt 180.0 lb

## 2017-08-17 DIAGNOSIS — M5416 Radiculopathy, lumbar region: Secondary | ICD-10-CM | POA: Insufficient documentation

## 2017-08-17 DIAGNOSIS — Z882 Allergy status to sulfonamides status: Secondary | ICD-10-CM | POA: Insufficient documentation

## 2017-08-17 DIAGNOSIS — Z9889 Other specified postprocedural states: Secondary | ICD-10-CM | POA: Insufficient documentation

## 2017-08-17 DIAGNOSIS — Z96649 Presence of unspecified artificial hip joint: Secondary | ICD-10-CM | POA: Insufficient documentation

## 2017-08-17 MED ORDER — LIDOCAINE HCL (PF) 1 % IJ SOLN
INTRAMUSCULAR | Status: AC
  Filled 2017-08-17: qty 5

## 2017-08-17 MED ORDER — ROPIVACAINE HCL 2 MG/ML IJ SOLN
INTRAMUSCULAR | Status: AC
  Filled 2017-08-17: qty 10

## 2017-08-17 MED ORDER — ROPIVACAINE HCL 2 MG/ML IJ SOLN
2.0000 mL | Freq: Once | INTRAMUSCULAR | Status: AC
  Administered 2017-08-17: 2 mL via EPIDURAL

## 2017-08-17 MED ORDER — IOPAMIDOL (ISOVUE-M 200) INJECTION 41%
INTRAMUSCULAR | Status: AC
  Filled 2017-08-17: qty 10

## 2017-08-17 MED ORDER — DEXAMETHASONE SODIUM PHOSPHATE 10 MG/ML IJ SOLN
10.0000 mg | Freq: Once | INTRAMUSCULAR | Status: AC
  Administered 2017-08-17: 10 mg

## 2017-08-17 MED ORDER — SODIUM CHLORIDE 0.9% FLUSH
2.0000 mL | Freq: Once | INTRAVENOUS | Status: AC
  Administered 2017-08-17: 2 mL

## 2017-08-17 MED ORDER — LIDOCAINE HCL (PF) 1 % IJ SOLN
4.5000 mL | Freq: Once | INTRAMUSCULAR | Status: AC
  Administered 2017-08-17: 4.5 mL

## 2017-08-17 MED ORDER — IOPAMIDOL (ISOVUE-M 200) INJECTION 41%
10.0000 mL | Freq: Once | INTRAMUSCULAR | Status: AC
  Administered 2017-08-17: 1 mL via EPIDURAL

## 2017-08-17 MED ORDER — DEXAMETHASONE SODIUM PHOSPHATE 10 MG/ML IJ SOLN
INTRAMUSCULAR | Status: AC
Start: 2017-08-17 — End: ?
  Filled 2017-08-17: qty 1

## 2017-08-17 NOTE — Patient Instructions (Signed)
Epidural Steroid Injection An epidural steroid injection is a shot of steroid medicine and numbing medicine that is given into the space between the spinal cord and the bones in your back (epidural space). The shot helps relieve pain caused by an irritated or swollen nerve root. The amount of pain relief you get from the injection depends on what is causing the nerve to be swollen and irritated, and how long your pain lasts. You are more likely to benefit from this injection if your pain is strong and comes on suddenly rather than if you have had pain for a long time. Tell a health care provider about:  Any allergies you have.  All medicines you are taking, including vitamins, herbs, eye drops, creams, and over-the-counter medicines.  Any problems you or family members have had with anesthetic medicines.  Any blood disorders you have.  Any surgeries you have had.  Any medical conditions you have.  Whether you are pregnant or may be pregnant. What are the risks? Generally, this is a safe procedure. However, problems may occur, including:  Headache.  Bleeding.  Infection.  Allergic reaction to medicines.  Damage to your nerves. What happens before the procedure? Staying hydrated  Follow instructions from your health care provider about hydration, which may include:  Up to 2 hours before the procedure - you may continue to drink clear liquids, such as water, clear fruit juice, black coffee, and plain tea. Eating and drinking restrictions  Follow instructions from your health care provider about eating and drinking, which may include:  8 hours before the procedure - stop eating heavy meals or foods such as meat, fried foods, or fatty foods.  6 hours before the procedure - stop eating light meals or foods, such as toast or cereal.  6 hours before the procedure - stop drinking milk or drinks that contain milk.  2 hours before the procedure - stop drinking clear  liquids. Medicine  You may be given medicines to lower anxiety.  Ask your health care provider about:  Changing or stopping your regular medicines. This is especially important if you are taking diabetes medicines or blood thinners.  Taking medicines such as aspirin and ibuprofen. These medicines can thin your blood. Do not take these medicines before your procedure if your health care provider instructs you not to. General instructions  Plan to have someone take you home from the hospital or clinic. What happens during the procedure?  You may receive a medicine to help you relax (sedative).  You will be asked to lie on your abdomen.  The injection site will be cleaned.  A numbing medicine (local anesthetic) will be used to numb the injection site.  A needle will be inserted through your skin into the epidural space. You may feel some discomfort when this happens. An X-ray machine will be used to make sure the needle is put as close as possible to the affected nerve.  A steroid medicine and a local anesthetic will be injected into the epidural space.  The needle will be removed.  A bandage (dressing) will be put over the injection site. What happens after the procedure?  Your blood pressure, heart rate, breathing rate, and blood oxygen level will be monitored until the medicines you were given have worn off.  Your arm or leg may feel weak or numb for a few hours.  The injection site may feel sore.  Do not drive for 24 hours if you received a sedative. This information   information is not intended to replace advice given to you by your health care provider. Make sure you discuss any questions you have with your health care provider. Document Released: 06/24/2007 Document Revised: 08/29/2015 Document Reviewed: 07/03/2015 Elsevier Interactive Patient Education  2018 [employer]. Pain Management Discharge Instructions  General Discharge Instructions :  If you need to reach your  doctor call: Monday-Friday 8:00 am - 4:00 pm at [phone] or toll free [phone].  After clinic hours [phone] to have operator reach doctor.  Bring all of your medication bottles to all your appointments in the pain clinic.  To cancel or reschedule your appointment with Pain Management please remember to call 24 hours in advance to avoid a fee.  Refer to the educational materials which you have been given on: General Risks, I had my Procedure. Discharge Instructions, Post Sedation.  Post Procedure Instructions:  The drugs you were given will stay in your system until tomorrow, so for the next 24 hours you should not drive, make any legal decisions or drink any alcoholic beverages.  You may eat anything you prefer, but it is better to start with liquids then soups and crackers, and gradually work up to solid foods.  Please notify your doctor immediately if you have any unusual bleeding, trouble breathing or pain that is not related to your normal pain.  Depending on the type of procedure that was done, some parts of your body may feel week and/or numb.  This usually clears up by tonight or the next day.  Walk with the use of an assistive device or accompanied by an adult for the 24 hours.  You may use ice on the affected area for the first 24 hours.  Put ice in a Ziploc bag and cover with a towel and place against area 15 minutes on 15 minutes off.  You may switch to heat after 24 hours.

## 2017-08-17 NOTE — Progress Notes (Signed)
Patient's Name: Blake [name]  MRN: [id number]  Referring Provider: Idelle Crouch, MD  DOB: [birth year]  PCP: Idelle Crouch, MD  DOS: 08/17/2017  Note by: Gillis Santa, MD  Service setting: Ambulatory outpatient  Specialty: Interventional Pain Management  Patient type: Established  Location: [facility] (AMB) Pain Management Facility  Visit type: Interventional Procedure   Primary Reason for Visit: Interventional Pain Management Treatment. CC: Back Pain (lower, right is worse today)  Procedure:  Anesthesia, Analgesia, Anxiolysis:  Type: Therapeutic Inter-Laminar Epidural Steroid Injection #3  Region: Lumbar Level: L4-5 Level. Laterality: Midline         Type: Local Anesthesia Local Anesthetic: Lidocaine 1% Route: Infiltration ([place]/IM) IV Access: Declined Sedation: Declined  Indication(s): Analgesia and Anxiety   Indications: 1. Lumbar radiculopathy    Pain Score: Pre-procedure: 5 /10 Post-procedure: 4 /10  Pre-op Assessment:  Mr. Band is a 72 y.o. (year old), male patient, seen today for interventional treatment. He  has a past surgical history that includes Partial hip arthroplasty (2014); Tongue Biopsy (2006); Port-a-cath removal; Colonoscopy (12/08/2003); Tonsillectomy; and Upper gi endoscopy (01/25/14). Mr. Krzyzanowski has a current medication list which includes the following prescription(s): buffered aspirin, atorvastatin, one touch ultra system kit, co-enzyme q-10, lisinopril, metoprolol succinate, omega-3 acid ethyl esters, omeprazole, tamsulosin, b complex vitamins, lisinopril, multiple vitamin, polyethylene glycol powder, pravastatin, and ranitidine. His primarily concern today is the Back Pain (lower, right is worse today)  Initial Vital Signs:  Pulse Rate: 84 Temp: 98.3 F (36.8 C) ECG Heart Rate:   Resp: 16 BP: (!) 175/87 SpO2: 99 % ETCO2:    BMI: Estimated body mass index is 29.95 kg/m as calculated from the following:   Height as of this encounter: '5\' 5"'  (1.651 m).    Weight as of this encounter: 180 lb (81.6 kg).  Risk Assessment: Allergies: Reviewed. He is allergic to sulfa antibiotics.  Allergy Precautions: None required Coagulopathies: Reviewed. None identified.  Blood-thinner therapy: None at this time Active Infection(s): Reviewed. None identified. Mr. Knippenberg is afebrile  Site Confirmation: Mr. Zion was asked to confirm the procedure and laterality before marking the site Procedure checklist: Completed Consent: Before the procedure and under the influence of no sedative(s), amnesic(s), or anxiolytics, the patient was informed of the treatment options, risks and possible complications. To fulfill our ethical and legal obligations, as recommended by the American Medical Association's Code of Ethics, I have informed the patient of my clinical impression; the nature and purpose of the treatment or procedure; the risks, benefits, and possible complications of the intervention; the alternatives, including doing nothing; the risk(s) and benefit(s) of the alternative treatment(s) or procedure(s); and the risk(s) and benefit(s) of doing nothing. The patient was provided information about the general risks and possible complications associated with the procedure. These may include, but are not limited to: failure to achieve desired goals, infection, bleeding, organ or nerve damage, allergic reactions, paralysis, and death. In addition, the patient was informed of those risks and complications associated to Spine-related procedures, such as failure to decrease pain; infection (i.e.: Meningitis, epidural or intraspinal abscess); bleeding (i.e.: epidural hematoma, subarachnoid hemorrhage, or any other type of intraspinal or peri-dural bleeding); organ or nerve damage (i.e.: Any type of peripheral nerve, nerve root, or spinal cord injury) with subsequent damage to sensory, motor, and/or autonomic systems, resulting in permanent pain, numbness, and/or weakness of one or  several areas of the body; allergic reactions; (i.e.: anaphylactic reaction); and/or death. Furthermore, the patient was informed of those risks and  complications associated with the medications. These include, but are not limited to: allergic reactions (i.e.: anaphylactic or anaphylactoid reaction(s)); adrenal axis suppression; blood sugar elevation that in diabetics may result in ketoacidosis or comma; water retention that in patients with history of congestive heart failure may result in shortness of breath, pulmonary edema, and decompensation with resultant heart failure; weight gain; swelling or edema; medication-induced neural toxicity; particulate matter embolism and blood vessel occlusion with resultant organ, and/or nervous system infarction; and/or aseptic necrosis of one or more joints. Finally, the patient was informed that Medicine is not an exact science; therefore, there is also the possibility of unforeseen or unpredictable risks and/or possible complications that may result in a catastrophic outcome. The patient indicated having understood very clearly. We have given the patient no guarantees and we have made no promises. Enough time was given to the patient to ask questions, all of which were answered to the patient's satisfaction. Mr. Weyenberg has indicated that he wanted to continue with the procedure. Attestation: I, the ordering provider, attest that I have discussed with the patient the benefits, risks, side-effects, alternatives, likelihood of achieving goals, and potential problems during recovery for the procedure that I have provided informed consent. Date: 08/17/2017; Time: 2:00 PM  Pre-Procedure Preparation:  Monitoring: As per clinic protocol. Respiration, ETCO2, SpO2, BP, heart rate and rhythm monitor placed and checked for adequate function Safety Precautions: Patient was assessed for positional comfort and pressure points before starting the procedure. Time-out: I initiated and  conducted the "Time-out" before starting the procedure, as per protocol. The patient was asked to participate by confirming the accuracy of the "Time Out" information. Verification of the correct person, site, and procedure were performed and confirmed by me, the nursing staff, and the patient. "Time-out" conducted as per Joint Commission's Universal Protocol (UP.01.01.01). "Time-out" Date & Time: 08/17/2017; 1251 hrs.  Description of Procedure Process:   Position: Prone with head of the table was raised to facilitate breathing. Target Area: The interlaminar space, initially targeting the lower laminar border of the superior vertebral body. Approach: Paramedial approach. Area Prepped: Entire Posterior Lumbar Region Prepping solution: ChloraPrep (2% chlorhexidine gluconate and 70% isopropyl alcohol) Safety Precautions: Aspiration looking for blood return was conducted prior to all injections. At no point did we inject any substances, as a needle was being advanced. No attempts were made at seeking any paresthesias. Safe injection practices and needle disposal techniques used. Medications properly checked for expiration dates. SDV (single dose vial) medications used. Description of the Procedure: Protocol guidelines were followed. The procedure needle was introduced through the skin, ipsilateral to the reported pain, and advanced to the target area. Bone was contacted and the needle walked caudad, until the lamina was cleared. The epidural space was identified using "loss-of-resistance technique" with 2-3 ml of PF-NaCl (0.9% NSS), in a 5cc LOR glass syringe. Vitals:   08/17/17 1250 08/17/17 1255 08/17/17 1300 08/17/17 1310  BP: (!) 165/94 (!) 156/77 137/72 140/71  Pulse: 75 73 71 69  Resp: 17 (!) '21 18 17  ' Temp:      TempSrc:      SpO2: 94% 95% 94% 95%  Weight:      Height:        Start Time: 1251 hrs. End Time: 1302 hrs. Materials:  Needle(s) Type: Epidural needle Gauge: 17G Length:  3.5-in Medication(s): We administered iopamidol, ropivacaine (PF) 2 mg/mL (0.2%), sodium chloride flush, dexamethasone, and lidocaine (PF). Please see chart orders for dosing details. 9 CC solution consisting  of 6 cc of preservative-free saline, 2 cc of 0.2% ropivacaine, 1 cc of Decadron 10 mg/cc. Imaging Guidance (Spinal):  Type of Imaging Technique: Fluoroscopy Guidance (Spinal) Indication(s): Assistance in needle guidance and placement for procedures requiring needle placement in or near specific anatomical locations not easily accessible without such assistance. Exposure Time: Please see nurses notes. Contrast: Before injecting any contrast, we confirmed that the patient did not have an allergy to iodine, shellfish, or radiological contrast. Once satisfactory needle placement was completed at the desired level, radiological contrast was injected. Contrast injected under live fluoroscopy. No contrast complications. See chart for type and volume of contrast used. Fluoroscopic Guidance: I was personally present during the use of fluoroscopy. "Tunnel Vision Technique" used to obtain the best possible view of the target area. Parallax error corrected before commencing the procedure. "Direction-depth-direction" technique used to introduce the needle under continuous pulsed fluoroscopy. Once target was reached, antero-posterior, oblique, and lateral fluoroscopic projection used confirm needle placement in all planes. Images permanently stored in EMR. Interpretation: I personally interpreted the imaging intraoperatively. Adequate needle placement confirmed in multiple planes. Appropriate spread of contrast into desired area was observed. No evidence of afferent or efferent intravascular uptake. No intrathecal or subarachnoid spread observed. Permanent images saved into the patient's record.  Antibiotic Prophylaxis:  Indication(s): None identified Antibiotic given: None  Post-operative Assessment:   Post-procedure Vital Signs:  Pulse Rate: 69 Temp: 98.3 F (36.8 C) ECG Heart Rate:   Resp: 17 BP: 140/71 SpO2: 95 % ETCO2:   EBL: None Complications: No immediate post-treatment complications observed by team, or reported by patient. Note: The patient tolerated the entire procedure well. A repeat set of vitals were taken after the procedure and the patient was kept under observation following institutional policy, for this type of procedure. Post-procedural neurological assessment was performed, showing return to baseline, prior to discharge. The patient was provided with post-procedure discharge instructions, including a section on how to identify potential problems. Should any problems arise concerning this procedure, the patient was given instructions to immediately contact us, at any time, without hesitation. In any case, we plan to contact the patient by telephone for a follow-up status report regarding this interventional procedure. Comments:  No additional relevant information. 5 out of 5 strength bilateral lower extremity: Plantar flexion, dorsiflexion, knee flexion, knee extension.  Plan of Care    Imaging Orders     DG C-Arm 1-60 Min-No Report Procedure Orders    No procedure(s) ordered today    Medications ordered for procedure: Meds ordered this encounter  Medications  . iopamidol (ISOVUE-M) 41 % intrathecal injection 10 mL  . ropivacaine (PF) 2 mg/mL (0.2%) (NAROPIN) injection 2 mL  . sodium chloride flush (NS) 0.9 % injection 2 mL  . dexamethasone (DECADRON) injection 10 mg  . lidocaine (PF) (XYLOCAINE) 1 % injection 4.5 mL   Medications administered: We administered iopamidol, ropivacaine (PF) 2 mg/mL (0.2%), sodium chloride flush, dexamethasone, and lidocaine (PF).  See the medical record for exact dosing, route, and time of administration.  New Prescriptions   No medications on file   Disposition: Discharge home  Discharge Date & Time: 08/17/2017; 1313 hrs.    Physician-requested Follow-up: Return in about 3 weeks (around 09/09/2017) for Post Procedure Evaluation.  Future Appointments  Date Time Provider [place]  09/14/2017  1:15 PM Gillis Santa, MD [facility] None  10/15/2017 10:30 AM [facility] None  10/15/2017 11:30 AM Ste[place]ayer, Janalyn Harder, PA-C [facility] None   Primary Care  Physician: Idelle Crouch, MD Location: [facility] Outpatient Pain Management Facility Note by: Gillis Santa, MD Date: 08/17/2017; Time: 2:27 PM  Disclaimer:  Medicine is not an exact science. The only guarantee in medicine is that nothing is guaranteed. It is important to note that the decision to proceed with this intervention was based on the information collected from the patient. The Data and conclusions were drawn from the patient's questionnaire, the interview, and the physical examination. Because the information was provided in large part by the patient, it cannot be guaranteed that it has not been purposely or unconsciously manipulated. Every effort has been made to obtain as much relevant data as possible for this evaluation. It is important to note that the conclusions that lead to this procedure are derived in large part from the available data. Always take into account that the treatment will also be dependent on availability of resources and existing treatment guidelines, considered by other Pain Management Practitioners as being common knowledge and practice, at the time of the intervention. For Medico-Legal purposes, it is also important to point out that variation in procedural techniques and pharmacological choices are the acceptable norm. The indications, contraindications, technique, and results of the above procedure should only be interpreted and judged by a Board-Certified Interventional Pain Specialist with extensive familiarity and expertise in the same exact procedure and technique.

## 2017-08-17 NOTE — Progress Notes (Signed)
Safety precautions to be maintained throughout the outpatient stay will include: orient to surroundings, keep bed in low position, maintain call bell within reach at all times, provide assistance with transfer out of bed and ambulation.  

## 2017-08-18 ENCOUNTER — Telehealth: Payer: Self-pay

## 2017-08-18 NOTE — Telephone Encounter (Signed)
Post procedure phone call.  Left message.  

## 2017-09-14 ENCOUNTER — Encounter: Payer: Self-pay | Admitting: Student in an Organized Health Care Education/Training Program

## 2017-09-14 ENCOUNTER — Other Ambulatory Visit: Payer: Self-pay

## 2017-09-14 ENCOUNTER — Ambulatory Visit
Payer: Medicare Other | Attending: Student in an Organized Health Care Education/Training Program | Admitting: Student in an Organized Health Care Education/Training Program

## 2017-09-14 VITALS — BP 159/70 | HR 87 | Temp 98.1°F | Resp 16 | Ht 65.0 in | Wt 180.0 lb

## 2017-09-14 DIAGNOSIS — Z7982 Long term (current) use of aspirin: Secondary | ICD-10-CM | POA: Insufficient documentation

## 2017-09-14 DIAGNOSIS — G894 Chronic pain syndrome: Secondary | ICD-10-CM | POA: Diagnosis not present

## 2017-09-14 DIAGNOSIS — Z87891 Personal history of nicotine dependence: Secondary | ICD-10-CM | POA: Insufficient documentation

## 2017-09-14 DIAGNOSIS — E119 Type 2 diabetes mellitus without complications: Secondary | ICD-10-CM | POA: Diagnosis not present

## 2017-09-14 DIAGNOSIS — I10 Essential (primary) hypertension: Secondary | ICD-10-CM | POA: Diagnosis not present

## 2017-09-14 DIAGNOSIS — Z800 Family history of malignant neoplasm of digestive organs: Secondary | ICD-10-CM | POA: Insufficient documentation

## 2017-09-14 DIAGNOSIS — K219 Gastro-esophageal reflux disease without esophagitis: Secondary | ICD-10-CM | POA: Diagnosis not present

## 2017-09-14 DIAGNOSIS — M5116 Intervertebral disc disorders with radiculopathy, lumbar region: Secondary | ICD-10-CM | POA: Diagnosis not present

## 2017-09-14 DIAGNOSIS — J309 Allergic rhinitis, unspecified: Secondary | ICD-10-CM | POA: Diagnosis not present

## 2017-09-14 DIAGNOSIS — E785 Hyperlipidemia, unspecified: Secondary | ICD-10-CM | POA: Insufficient documentation

## 2017-09-14 DIAGNOSIS — M48062 Spinal stenosis, lumbar region with neurogenic claudication: Secondary | ICD-10-CM | POA: Diagnosis not present

## 2017-09-14 DIAGNOSIS — M5416 Radiculopathy, lumbar region: Secondary | ICD-10-CM | POA: Diagnosis not present

## 2017-09-14 DIAGNOSIS — M5136 Other intervertebral disc degeneration, lumbar region: Secondary | ICD-10-CM | POA: Diagnosis not present

## 2017-09-14 DIAGNOSIS — M545 Low back pain: Secondary | ICD-10-CM | POA: Insufficient documentation

## 2017-09-14 DIAGNOSIS — Z882 Allergy status to sulfonamides status: Secondary | ICD-10-CM | POA: Diagnosis not present

## 2017-09-14 DIAGNOSIS — Z79899 Other long term (current) drug therapy: Secondary | ICD-10-CM | POA: Insufficient documentation

## 2017-09-14 MED ORDER — DICLOFENAC SODIUM 75 MG PO TBEC: 75.0000 mg | DELAYED_RELEASE_TABLET | Freq: Two times a day (BID) | ORAL | 0 refills | Status: DC

## 2017-09-14 NOTE — Progress Notes (Signed)
Safety precautions to be maintained throughout the outpatient stay will include: orient to surroundings, keep bed in low position, maintain call bell within reach at all times, provide assistance with transfer out of bed and ambulation.  

## 2017-09-14 NOTE — Progress Notes (Signed)
Prescription patient's Name: Blake [name]  MRN: [id number]  Referring Provider: Idelle Crouch, MD  DOB: May 16, 1945  PCP: Idelle Crouch, MD  DOS: 09/14/2017  Note by: Gillis Santa, MD  Service setting: Ambulatory outpatient  Specialty: Interventional Pain Management  Location: [facility] (AMB) Pain Management Facility    Patient type: Established   Primary Reason(s) for Visit: Encounter for post-procedure evaluation of chronic illness with mild to moderate exacerbation CC: Back Pain (lower)  HPI  Blake [name] is a 72 y.o. year old, male patient, who comes today for a post-procedure evaluation. He has Allergic rhinitis; Deficiency, disaccharidase intestinal; Diabetes mellitus ([place]); Acid reflux; Essential (primary) hypertension; Family history of cancer of digestive system; Personal history of malignant neoplasm of tongue; HLD (hyperlipidemia); Gastric polyposis; Carotid stenosis; Lumbar radiculopathy; Lumbar degenerative disc disease; and Spinal stenosis, lumbar region, with neurogenic claudication on their problem list. His primarily concern today is the Back Pain (lower)  Pain Assessment: Location: Lower, Right Back Radiating: back of legs to feet bilateral Onset:   Duration: Chronic pain Quality: Constant, Burning, Discomfort Severity: 6 /10 (subjective, self-reported pain score)  Note: Reported level is compatible with observation.                         When using our objective Pain Scale, levels between 6 and 10/10 are said to belong in an emergency room, as it progressively worsens from a 6/10, described as severely limiting, requiring emergency care not usually available at an outpatient pain management facility. At a 6/10 level, communication becomes difficult and requires great effort. Assistance to reach the emergency department may be required. Facial flushing and profuse sweating along with potentially dangerous increases in heart rate and blood pressure will be evident. Effect on  ADL: sitting too long will increase pain, gets better as he moves around Timing:   Modifying factors: rest BP: (!) 159/70  HR: 87  Blake [name] comes in today for post-procedure evaluation after the treatment done on 08/18/2017.  Further details on both, my assessment(s), as well as the proposed treatment plan, please see below.  Post-Procedure Assessment  08/17/2017 Procedure: L4-L5 ESI Pre-procedure pain score:  5/10 Post-procedure pain score: 4/10         Influential Factors: BMI: 29.95 kg/m Intra-procedural challenges: None observed.         Assessment challenges: None detected.              Reported side-effects: None.        Post-procedural adverse reactions or complications: None reported         Sedation: Please see nurses note. When no sedatives are used, the analgesic levels obtained are directly associated to the effectiveness of the local anesthetics. However, when sedation is provided, the level of analgesia obtained during the initial 1 hour following the intervention, is believed to be the result of a combination of factors. These factors may include, but are not limited to: 1. The effectiveness of the local anesthetics used. 2. The effects of the analgesic(s) and/or anxiolytic(s) used. 3. The degree of discomfort experienced by the patient at the time of the procedure. 4. The patients ability and reliability in recalling and recording the events. 5. The presence and influence of possible secondary gains and/or psychosocial factors. Reported result: Relief experienced during the 1st hour after the procedure: 50 % (Ultra-Short Term Relief)            Interpretative annotation: Clinically appropriate result. Analgesia  during this period is likely to be Local Anesthetic and/or IV Sedative (Analgesic/Anxiolytic) related.          Effects of local anesthetic: The analgesic effects attained during this period are directly associated to the localized infiltration of local  anesthetics and therefore cary significant diagnostic value as to the etiological location, or anatomical origin, of the pain. Expected duration of relief is directly dependent on the pharmacodynamics of the local anesthetic used. Long-acting (4-6 hours) anesthetics used.  Reported result: Relief during the next 4 to 6 hour after the procedure: 50 % (Short-Term Relief)            Interpretative annotation: Clinically appropriate result. Analgesia during this period is likely to be Local Anesthetic-related.          Long-term benefit: Defined as the period of time past the expected duration of local anesthetics (1 hour for short-acting and 4-6 hours for long-acting). With the possible exception of prolonged sympathetic blockade from the local anesthetics, benefits during this period are typically attributed to, or associated with, other factors such as analgesic sensory neuropraxia, antiinflammatory effects, or beneficial biochemical changes provided by agents other than the local anesthetics.  Reported result: Extended relief following procedure: 0 %(Resting/sitting he is 50 better) (Long-Term Relief)            Interpretative annotation: Clinically appropriate result. Good relief. No permanent benefit expected. Inflammation plays a part in the etiology to the pain.          Current benefits: Defined as reported results that persistent at this point in time.   Analgesia: <25 %            Function: Somewhat improved ROM: Somewhat improved Interpretative annotation: Recurrence of symptoms. No permanent benefit expected. Effective diagnostic intervention.          Interpretation: Results would suggest a successful diagnostic intervention.                  Plan:  Please see "Plan of Care" for details.                Laboratory Chemistry  Inflammation Markers (CRP: Acute Phase) (ESR: Chronic Phase) Lab Results  Component Value Date   ESRSEDRATE 1 01/12/2013                         Rheumatology  Markers No results found for: RF, ANA, LABURIC, URICUR, LYMEIGGIGMAB, LYMEABIGMQN, HLAB27                      Renal Function Markers Lab Results  Component Value Date   BUN 19 (H) 01/26/2013   CREATININE 1.18 01/26/2013   GFRAA >60 01/26/2013   GFRNONAA >60 01/26/2013                             Hepatic Function Markers Lab Results  Component Value Date   AST 18 08/11/2011   ALT 35 08/11/2011   ALBUMIN 3.8 08/11/2011   ALKPHOS 75 08/11/2011                        Electrolytes Lab Results  Component Value Date   NA 130 (L) 01/26/2013   K 4.6 01/26/2013   CL 101 01/26/2013   CALCIUM 8.3 (L) 01/26/2013  Neuropathy Markers Lab Results  Component Value Date   HGBA1C 6.9 (H) 01/25/2013                        Bone Pathology Markers No results found for: VD25OH, JG811XB2IOM, BT5974BU3, AG5364WO0, 25OHVITD1, 25OHVITD2, 25OHVITD3, TESTOFREE, TESTOSTERONE                       Coagulation Parameters Lab Results  Component Value Date   INR 0.9 01/12/2013   LABPROT 12.0 01/12/2013   APTT 29.5 01/12/2013   PLT 220 01/26/2013                        Cardiovascular Markers Lab Results  Component Value Date   CKTOTAL 483 (H) 01/25/2013   CKMB 4.7 (H) 01/25/2013   TROPONINI < 0.02 01/25/2013   HGB 11.8 (L) 01/26/2013   HCT 47.5 01/12/2013                         CA Markers No results found for: CEA, CA125, LABCA2                      Note: Lab results reviewed.  Recent Diagnostic Imaging Results  DG C-Arm 1-60 Min-No Report Fluoroscopy was utilized by the requesting physician.  No radiographic  interpretation.   Complexity Note: Imaging results reviewed. Results shared with Blake [name], using Layman's terms.                         Meds   Current Outpatient Medications:  .  aspirin EC 81 MG tablet, Take 81 mg by mouth daily., Disp: , Rfl:  .  atorvastatin (LIPITOR) 80 MG tablet, 80 mg daily at 6 PM. , Disp: , Rfl: 2 .  b complex vitamins  tablet, Take 1 tablet by mouth daily., Disp: , Rfl:  .  Blood Glucose Monitoring Suppl (ONE TOUCH ULTRA SYSTEM KIT) w/Device KIT, , Disp: , Rfl:  .  co-enzyme Q-10 30 MG capsule, Take 30 mg by mouth daily., Disp: , Rfl:  .  lisinopril (PRINIVIL,ZESTRIL) 2.5 MG tablet, 2.5 mg daily. , Disp: , Rfl:  .  lisinopril (PRINIVIL,ZESTRIL) 20 MG tablet, Take 20 mg by mouth daily. , Disp: , Rfl:  .  metoprolol succinate (TOPROL-XL) 50 MG 24 hr tablet, Take 50 mg by mouth daily. , Disp: , Rfl:  .  Multiple Vitamin tablet, , Disp: , Rfl:  .  omega-3 acid ethyl esters (LOVAZA) 1 g capsule, Take by mouth., Disp: , Rfl:  .  omeprazole (PRILOSEC) 40 MG capsule, , Disp: , Rfl: 3 .  polyethylene glycol powder (GLYCOLAX/MIRALAX) powder, 255 grams one bottle for colonoscopy prep, Disp: 255 g, Rfl: 0 .  pravastatin (PRAVACHOL) 80 MG tablet, Take by mouth., Disp: , Rfl:  .  tamsulosin (FLOMAX) 0.4 MG CAPS capsule, TAKE 1 CAPSULE BY MOUTH ONCE DAILY. TAKE 30 MINUTES AFTER SAME MEAL EACH DAY., Disp: , Rfl:  .  Aspirin Buf,CaCarb-MgCarb-MgO, (BUFFERED ASPIRIN) 325 MG TABS, Take by mouth., Disp: , Rfl:  .  diclofenac (VOLTAREN) 75 MG EC tablet, Take 1 tablet (75 mg total) by mouth 2 (two) times daily after a meal., Disp: 60 tablet, Rfl: 0 .  ranitidine (ZANTAC) 300 MG tablet, Take by mouth., Disp: , Rfl:   ROS  Constitutional: Denies any fever or chills Gastrointestinal: No reported  hemesis, hematochezia, vomiting, or acute GI distress Musculoskeletal: Denies any acute onset joint swelling, redness, loss of ROM, or weakness Neurological: No reported episodes of acute onset apraxia, aphasia, dysarthria, agnosia, amnesia, paralysis, loss of coordination, or loss of consciousness  Allergies  Blake [name] is allergic to sulfa antibiotics.  [place]  Drug: Blake [name]  reports that he does not use drugs. Alcohol:  reports that he drinks alcohol. Tobacco:  reports that he has quit smoking. His smoking use included cigarettes. He  has a 30.00 pack-year smoking history. He has never used smokeless tobacco. Medical:  has a past medical history of GERD (gastroesophageal reflux disease), Hyperlipidemia, and Hypertension. Surgical: Blake [name]  has a past surgical history that includes Partial hip arthroplasty (2014); Tongue Biopsy (2006); Port-a-cath removal; Colonoscopy (12/08/2003); Tonsillectomy; and Upper gi endoscopy (01/25/14). Family: family history includes Diabetes in his mother; Lung cancer in his father.  Constitutional Exam  General appearance: Well nourished, well developed, and well hydrated. In no apparent acute distress Vitals:   09/14/17 1305  BP: (!) 159/70  Pulse: 87  Resp: 16  Temp: 98.1 F (36.7 C)  SpO2: 98%  Weight: 180 lb (81.6 kg)  Height: _0  (1.651 m)   BMI Assessment: Estimated body mass index is 29.95 kg/m as calculated from the following:   Height as of this encounter: _1  (1.651 m).   Weight as of this encounter: 180 lb (81.6 kg).  BMI interpretation table: BMI level Category Range association with higher incidence of chronic pain  <18 kg/m2 Underweight   18.5-24.9 kg/m2 Ideal body weight   25-29.9 kg/m2 Overweight Increased incidence by 20%  30-34.9 kg/m2 Obese (Class I) Increased incidence by 68%  35-39.9 kg/m2 Severe obesity (Class II) Increased incidence by 136%  >40 kg/m2 Extreme obesity (Class III) Increased incidence by 254%   Patient's current BMI Ideal Body weight  Body mass index is 29.95 kg/m. Ideal body weight: 61.5 kg (135 lb 9.3 oz) Adjusted ideal body weight: 69.6 kg (153 lb 5.6 oz)   BMI Readings from Last 4 Encounters:  09/14/17 29.95 kg/m  08/17/17 29.95 kg/m  08/04/17 29.95 kg/m  06/15/17 29.95 kg/m   Wt Readings from Last 4 Encounters:  09/14/17 180 lb (81.6 kg)  08/17/17 180 lb (81.6 kg)  08/04/17 180 lb (81.6 kg)  06/15/17 180 lb (81.6 kg)  Psych/Mental status: Alert, oriented x 3 (person, place, & time)       Eyes: PERLA Respiratory: No  evidence of acute respiratory distress  Cervical Spine Area Exam  Skin & Axial Inspection: No masses, redness, edema, swelling, or associated skin lesions Alignment: Symmetrical Functional ROM: Unrestricted ROM      Stability: No instability detected Muscle Tone/Strength: Functionally intact. No obvious neuro-muscular anomalies detected. Sensory (Neurological): Unimpaired Palpation: No palpable anomalies              Upper Extremity (UE) Exam    Side: Right upper extremity  Side: Left upper extremity  Skin & Extremity Inspection: Skin color, temperature, and hair growth are WNL. No peripheral edema or cyanosis. No masses, redness, swelling, asymmetry, or associated skin lesions. No contractures.  Skin & Extremity Inspection: Skin color, temperature, and hair growth are WNL. No peripheral edema or cyanosis. No masses, redness, swelling, asymmetry, or associated skin lesions. No contractures.  Functional ROM: Unrestricted ROM          Functional ROM: Unrestricted ROM          Muscle Tone/Strength: Functionally intact. No obvious  neuro-muscular anomalies detected.  Muscle Tone/Strength: Functionally intact. No obvious neuro-muscular anomalies detected.  Sensory (Neurological): Unimpaired          Sensory (Neurological): Unimpaired          Palpation: No palpable anomalies              Palpation: No palpable anomalies              Provocative Test(s):  Phalen's test: deferred Tinel's test: deferred Apley's scratch test (touch opposite shoulder):  Action 1 (Across chest): deferred Action 2 (Overhead): deferred Action 3 (LB reach): deferred   Provocative Test(s):  Phalen's test: deferred Tinel's test: deferred Apley's scratch test (touch opposite shoulder):  Action 1 (Across chest): deferred Action 2 (Overhead): deferred Action 3 (LB reach): deferred    Thoracic Spine Area Exam  Skin & Axial Inspection: No masses, redness, or swelling Alignment: Symmetrical Functional ROM: Unrestricted  ROM Stability: No instability detected Muscle Tone/Strength: Functionally intact. No obvious neuro-muscular anomalies detected. Sensory (Neurological): Unimpaired Muscle strength & Tone: No palpable anomalies  Lumbar Spine Area Exam  Skin & Axial Inspection: No masses, redness, or swelling Alignment: Symmetrical Functional ROM: Unrestricted ROM       Stability: No instability detected Muscle Tone/Strength: Functionally intact. No obvious neuro-muscular anomalies detected. Sensory (Neurological): Unimpaired Palpation: No palpable anomalies       Provocative Tests: Lumbar Hyperextension/rotation test: deferred today       Lumbar quadrant test (Kemp's test): deferred today       Lumbar Lateral bending test: deferred today       Patrick's Maneuver: deferred today                   FABER test: deferred today       Thigh-thrust test: deferred today       S-I compression test: deferred today       S-I distraction test: deferred today        Gait & Posture Assessment  Ambulation: Unassisted Gait: Relatively normal for age and body habitus Posture: WNL   Lower Extremity Exam    Side: Right lower extremity  Side: Left lower extremity  Stability: No instability observed          Stability: No instability observed          Skin & Extremity Inspection: Skin color, temperature, and hair growth are WNL. No peripheral edema or cyanosis. No masses, redness, swelling, asymmetry, or associated skin lesions. No contractures.  Skin & Extremity Inspection: Skin color, temperature, and hair growth are WNL. No peripheral edema or cyanosis. No masses, redness, swelling, asymmetry, or associated skin lesions. No contractures.  Functional ROM: Unrestricted ROM                  Functional ROM: Unrestricted ROM                  Muscle Tone/Strength: Functionally intact. No obvious neuro-muscular anomalies detected.  Muscle Tone/Strength: Functionally intact. No obvious neuro-muscular anomalies detected.   Sensory (Neurological): Unimpaired  Sensory (Neurological): Unimpaired  Palpation: No palpable anomalies  Palpation: No palpable anomalies   Assessment  Primary Diagnosis & Pertinent Problem List: The primary encounter diagnosis was Lumbar radiculopathy. Diagnoses of Lumbar degenerative disc disease, Spinal stenosis, lumbar region, with neurogenic claudication, and Chronic pain syndrome were also pertinent to this visit.  Status Diagnosis  Persistent Persistent Persistent 1. Lumbar radiculopathy   2. Lumbar degenerative disc disease   3. Spinal stenosis, lumbar region,  with neurogenic claudication   4. Chronic pain syndrome     General Recommendations: The pain condition that the patient suffers from is best treated with a multidisciplinary approach that involves an increase in physical activity to prevent de-conditioning and worsening of the pain cycle, as well as psychological counseling (formal and/or informal) to address the co-morbid psychological affects of pain. Treatment will often involve judicious use of pain medications and interventional procedures to decrease the pain, allowing the patient to participate in the physical activity that will ultimately produce long-lasting pain reductions. The goal of the multidisciplinary approach is to return the patient to a higher level of overall function and to restore their ability to perform activities of daily living.   72 year old male with a history of lumbar radiculopathy and lumbar degenerative disc disease who follows up status post midline L4-L5 lumbar epidural steroid injection #3.  Patient notes moderate to significant improvement in his radicular pain when he is sitting.  He states that he has not had any significant radicular pain relief when he is exerting himself or walking.  At this point we discussed holding off on any additional lumbar epidural steroid injections.  I will prescribe the patient diclofenac 75 mg twice daily as  below.  Patient will follow-up with me in approximately 8 to 10 weeks.  Plan of Care  Pharmacotherapy (Medications Ordered): Meds ordered this encounter  Medications  . diclofenac (VOLTAREN) 75 MG EC tablet    Sig: Take 1 tablet (75 mg total) by mouth 2 (two) times daily after a meal.    Dispense:  60 tablet    Refill:  0   Time Note: Greater than 50% of the 15 minute(s) of face-to-face time spent with Blake [name], was spent in counseling/coordination of care regarding: Blake [name] primary cause of pain, the treatment plan, treatment alternatives, medication side effects, the results, interpretation and significance of  his recent diagnostic interventional treatment(s), realistic expectations and the goals of pain management (increased in functionality).  Provider-requested follow-up: Return in about 8 weeks (around 11/09/2017) for Medication Management.  Future Appointments  Date Time Provider [place]  10/15/2017 10:30 AM [facility] None  10/15/2017 11:30 AM Stegmayer, Janalyn Harder, PA-C [facility] None  11/03/2017 12:15 PM Gillis Santa, MD [facility] None    Primary Care Physician: Idelle Crouch, MD Location: [facility] Out[facility] Facility Note by: Gillis Santa, M.D Date: 09/14/2017; Time: 2:24 PM  There are no Patient Instructions on file for this visit.

## 2017-10-02 ENCOUNTER — Encounter (INDEPENDENT_AMBULATORY_CARE_PROVIDER_SITE_OTHER): Payer: No Typology Code available for payment source

## 2017-10-02 ENCOUNTER — Ambulatory Visit (INDEPENDENT_AMBULATORY_CARE_PROVIDER_SITE_OTHER): Payer: Medicare Other | Admitting: Vascular Surgery

## 2017-10-15 ENCOUNTER — Encounter (INDEPENDENT_AMBULATORY_CARE_PROVIDER_SITE_OTHER): Payer: Self-pay | Admitting: Vascular Surgery

## 2017-10-15 ENCOUNTER — Ambulatory Visit (INDEPENDENT_AMBULATORY_CARE_PROVIDER_SITE_OTHER): Payer: Medicare Other

## 2017-10-15 ENCOUNTER — Ambulatory Visit (INDEPENDENT_AMBULATORY_CARE_PROVIDER_SITE_OTHER): Payer: Medicare Other | Admitting: Vascular Surgery

## 2017-10-15 VITALS — BP 167/86 | HR 83 | Resp 17 | Ht 65.0 in | Wt 181.4 lb

## 2017-10-15 DIAGNOSIS — I6523 Occlusion and stenosis of bilateral carotid arteries: Secondary | ICD-10-CM

## 2017-10-15 DIAGNOSIS — E119 Type 2 diabetes mellitus without complications: Secondary | ICD-10-CM

## 2017-10-15 DIAGNOSIS — E785 Hyperlipidemia, unspecified: Secondary | ICD-10-CM

## 2017-10-15 NOTE — Progress Notes (Signed)
Subjective:    Patient ID: Blake [name], male    DOB: 18-May-1945, 72 y.o.   MRN: [id number] Chief Complaint  Patient presents with  . Carotid    37yrfollow up   Patient presents for a yearly non-invasive study follow up for carotid stenosis. The stenosis has been followed by surveillance duplexes. The patient underwent a bilateral carotid duplex scan which showed minimal from the previous exam on 10/31/16. Duplex is notable at 40-59% ICA stenosis bilaterally.  Vertebrals are antegrade.  Subclavian arteries are multiphasic and within normal limits bilaterally. The patient denies experiencing Amaurosis Fugax, TIA like symptoms or focal motor deficits.  Patient denies any fever, nausea vomiting.  Review of Systems  Constitutional: Negative.   HENT: Negative.   Eyes: Negative.   Respiratory: Negative.   Cardiovascular:       Carotid Stenosis  Gastrointestinal: Negative.   Endocrine: Negative.   Genitourinary: Negative.   Musculoskeletal: Negative.   Skin: Negative.   Allergic/Immunologic: Negative.   Neurological: Negative.   Hematological: Negative.   Psychiatric/Behavioral: Negative.       Objective:   Physical Exam  Constitutional: He is oriented to person, place, and time. He appears well-developed and well-nourished. No distress.  HENT:  Head: Normocephalic and atraumatic.  Right Ear: External ear normal.  Left Ear: External ear normal.  Eyes: Pupils are equal, round, and reactive to light. Conjunctivae and EOM are normal.  Neck: Normal range of motion.  Mild right carotid bruit noted.  No left carotid bruit noted.  Cardiovascular: Normal rate, regular rhythm, normal heart sounds and intact distal pulses.  Pulses:      Radial pulses are 2+ on the right side, and 2+ on the left side.  Pulmonary/Chest: Effort normal and breath sounds normal.  Musculoskeletal: Normal range of motion. He exhibits no edema.  Neurological: He is alert and oriented to person, place, and time.    Skin: Skin is warm and dry. He is not diaphoretic.  Psychiatric: He has a normal mood and affect. His behavior is normal. Judgment and thought content normal.  Vitals reviewed.  BP (!) 167/86 (BP Location: Left Arm)   Pulse 83   Resp 17   Ht '5\' 5"'  (1.651 m)   Wt 181 lb 6.4 oz (82.3 kg)   BMI 30.19 kg/m   Past Medical History:  Diagnosis Date  . GERD (gastroesophageal reflux disease)   . Hyperlipidemia   . Hypertension    Social History   Socioeconomic History  . Marital status: Married    Spouse name: Not on file  . Number of children: Not on file  . Years of education: Not on file  . Highest education level: Not on file  Occupational History  . Not on file  Social Needs  . Financial resource strain: Not on file  . Food insecurity:    Worry: Not on file    Inability: Not on file  . Transportation needs:    Medical: Not on file    Non-medical: Not on file  Tobacco Use  . Smoking status: Former Smoker    Packs/day: 1.00    Years: 30.00    Pack years: 30.00    Types: Cigarettes  . Smokeless tobacco: Never Used  Substance and Sexual Activity  . Alcohol use: Yes  . Drug use: No  . Sexual activity: Not on file  Lifestyle  . Physical activity:    Days per week: Not on file    Minutes per session:  Not on file  . Stress: Not on file  Relationships  . Social connections:    Talks on phone: Not on file    Gets together: Not on file    Attends religious service: Not on file    Active member of club or organization: Not on file    Attends meetings of clubs or organizations: Not on file    Relationship status: Not on file  . Intimate partner violence:    Fear of current or ex partner: Not on file    Emotionally abused: Not on file    Physically abused: Not on file    Forced sexual activity: Not on file  Other Topics Concern  . Not on file  Social History Narrative  . Not on file   Past Surgical History:  Procedure Laterality Date  . COLONOSCOPY  12/08/2003   . PARTIAL HIP ARTHROPLASTY  2014  . PORT-A-CATH REMOVAL    . TONGUE BIOPSY  2006  . TONSILLECTOMY    . UPPER GI ENDOSCOPY  01/25/14   multiple gastric polyps   Family History  Problem Relation Age of Onset  . Diabetes Mother   . Lung cancer Father    Allergies  Allergen Reactions  . Sulfa Antibiotics Rash      Assessment & Plan:  Patient presents for a yearly non-invasive study follow up for carotid stenosis. The stenosis has been followed by surveillance duplexes. The patient underwent a bilateral carotid duplex scan which showed minimal from the previous exam on 10/31/16. Duplex is notable at 40-59% ICA stenosis bilaterally.  Vertebrals are antegrade.  Subclavian arteries are multiphasic and within normal limits bilaterally. The patient denies experiencing Amaurosis Fugax, TIA like symptoms or focal motor deficits.  Patient denies any fever, nausea vomiting.  1. Bilateral carotid artery stenosis - Stable Studies reviewed with patient. Patient asymptomatic with stable duplex. No intervention at this time. Patient to return in one year for surveillance carotid duplex. Patient to continue medical optimization with ASA and dyslipidemia medication. Patient to remain abstinent of tobacco use. I have discussed with the patient at length the risk factors for and pathogenesis of atherosclerotic disease and encouraged a healthy diet, regular exercise regimen and blood pressure / glucose control.  Patient was instructed to contact our office in the interim with problems such as arm / leg weakness or numbness, speech / swallowing difficulty or temporary monocular blindness. The patient expresses their understanding.  - VAS US CAROTID; Future  2. Type 2 diabetes mellitus without complication, unspecified whether long term insulin use (HCC) - Stable Encouraged good control as its slows the progression of atherosclerotic disease  3. Hyperlipidemia, unspecified hyperlipidemia type -  Stable Encouraged good control as its slows the progression of atherosclerotic disease  Current Outpatient Medications on File Prior to Visit  Medication Sig Dispense Refill  . Aspirin Buf,CaCarb-MgCarb-MgO, (BUFFERED ASPIRIN) 325 MG TABS Take by mouth.    [name] Kitchen aspirin EC 81 MG tablet Take 81 mg by mouth daily.    [name] Kitchen atorvastatin (LIPITOR) 80 MG tablet 80 mg daily at 6 PM.   2  . b complex vitamins tablet Take 1 tablet by mouth daily.    . Blood Glucose Monitoring Suppl (ONE TOUCH ULTRA SYSTEM KIT) w/Device KIT     . co-enzyme Q-10 30 MG capsule Take 30 mg by mouth daily.    . diclofenac (VOLTAREN) 75 MG EC tablet Take 1 tablet (75 mg total) by mouth 2 (two) times daily after a meal. 60  tablet 0  . lisinopril (PRINIVIL,ZESTRIL) 2.5 MG tablet 2.5 mg daily.     [name] Kitchen lisinopril (PRINIVIL,ZESTRIL) 20 MG tablet Take 20 mg by mouth daily.     . metoprolol succinate (TOPROL-XL) 50 MG 24 hr tablet Take 50 mg by mouth daily.     . Multiple Vitamin tablet     . omega-3 acid ethyl esters (LOVAZA) 1 g capsule Take by mouth.    [name] Kitchen omeprazole (PRILOSEC) 40 MG capsule   3  . polyethylene glycol powder (GLYCOLAX/MIRALAX) powder 255 grams one bottle for colonoscopy prep 255 g 0  . pravastatin (PRAVACHOL) 80 MG tablet Take by mouth.    . tamsulosin (FLOMAX) 0.4 MG CAPS capsule TAKE 1 CAPSULE BY MOUTH ONCE DAILY. TAKE 30 MINUTES AFTER SAME MEAL EACH DAY.    . ranitidine (ZANTAC) 300 MG tablet Take by mouth.     No current facility-administered medications on file prior to visit.    There are no Patient Instructions on file for this visit. No follow-ups on file.  Nahomi Hegner A Chyna Kneece, PA-C

## 2017-11-03 ENCOUNTER — Ambulatory Visit: Payer: Medicare Other | Admitting: Student in an Organized Health Care Education/Training Program

## 2018-02-18 ENCOUNTER — Other Ambulatory Visit: Payer: Self-pay

## 2018-02-18 ENCOUNTER — Inpatient Hospital Stay: Admission: RE | Admit: 2018-02-18 | Payer: Medicare Other | Source: Ambulatory Visit

## 2018-02-18 ENCOUNTER — Encounter
Admission: RE | Admit: 2018-02-18 | Discharge: 2018-02-18 | Disposition: A | Discharge status: Home / Self Care | Payer: Medicare Other | Source: Ambulatory Visit | Attending: Neurosurgery | Admitting: Neurosurgery

## 2018-02-18 DIAGNOSIS — Z01818 Encounter for other preprocedural examination: Secondary | ICD-10-CM | POA: Insufficient documentation

## 2018-02-18 DIAGNOSIS — Z01810 Encounter for preprocedural cardiovascular examination: Secondary | ICD-10-CM

## 2018-02-18 HISTORY — DX: Anesthesia of skin: R20.2

## 2018-02-18 HISTORY — DX: Malignant (primary) neoplasm, unspecified: C80.1

## 2018-02-18 HISTORY — DX: Anesthesia of skin: R20.0

## 2018-02-18 HISTORY — DX: Dorsalgia, unspecified: M54.9

## 2018-02-18 HISTORY — DX: Dyspnea, unspecified: R06.00

## 2018-02-18 HISTORY — DX: Benign prostatic hyperplasia without lower urinary tract symptoms: N40.0

## 2018-02-18 LAB — BASIC METABOLIC PANEL
ANION GAP: 5 (ref 5–15)
BUN: 23 mg/dL (ref 8–23)
CHLORIDE: 103 mmol/L (ref 98–111)
CO2: 29 mmol/L (ref 22–32)
Calcium: 8.7 mg/dL — ABNORMAL LOW (ref 8.9–10.3)
Creatinine, Ser: 1.11 mg/dL (ref 0.61–1.24)
GFR calc non Af Amer: >60 mL/min (ref >60)
Glucose, Bld: 209 mg/dL — ABNORMAL HIGH (ref 70–99)
POTASSIUM: 4.3 mmol/L (ref 3.5–5.1)
SODIUM: 137 mmol/L (ref 135–145)

## 2018-02-18 LAB — CBC
HEMATOCRIT: 48.1 % (ref 39.0–52.0)
HEMOGLOBIN: 15.5 g/dL (ref 13.0–17.0)
MCH: 29.7 pg (ref 26.0–34.0)
MCHC: 32.2 g/dL (ref 30.0–36.0)
MCV: 92.1 fL (ref 80.0–100.0)
NRBC: 0 % (ref 0.0–0.2)
Platelets: 215 10*3/uL (ref 150–400)
RBC: 5.22 MIL/uL (ref 4.22–5.81)
RDW: 12.6 % (ref 11.5–15.5)
WBC: 6.2 10*3/uL (ref 4.0–10.5)

## 2018-02-18 LAB — SURGICAL PCR SCREEN
MRSA, PCR: NEGATIVE
STAPHYLOCOCCUS AUREUS: NEGATIVE

## 2018-02-18 LAB — URINALYSIS, COMPLETE (UACMP) WITH MICROSCOPIC
BACTERIA UA: NONE SEEN
BILIRUBIN URINE: NEGATIVE
Glucose, UA: 50 mg/dL — AB
Hgb urine dipstick: NEGATIVE
Ketones, ur: NEGATIVE mg/dL
Leukocytes, UA: NEGATIVE
Nitrite: NEGATIVE
PH: 6 (ref 5.0–8.0)
Protein, ur: NEGATIVE mg/dL
SPECIFIC GRAVITY, URINE: 1.014 (ref 1.005–1.030)
Squamous Epithelial / LPF: NONE SEEN (ref 0–5)

## 2018-02-18 LAB — PROTIME-INR
INR: 0.89
Prothrombin Time: 12 seconds (ref 11.4–15.2)

## 2018-02-18 LAB — TYPE AND SCREEN
ABO/RH(D): O POS
ANTIBODY SCREEN: NEGATIVE

## 2018-02-18 LAB — APTT: aPTT: 27 seconds (ref 24–36)

## 2018-02-18 NOTE — Patient Instructions (Signed)
Your procedure is scheduled on: 03/03/18 Wed Report to Same Day Surgery 2nd floor medical mall [facility] Entrance-take elevator on left to 2nd floor.  Check in with surgery information desk.) To find out your arrival time please call [phone] between 1PM - 3PM on 03/02/18 Tues Remember: Instructions that are not followed completely may result in serious medical risk, up to and including death, or upon the discretion of your surgeon and anesthesiologist your surgery may need to be rescheduled.    _x___ 1. Do not eat food after midnight the night before your procedure. You may drink clear liquids up to 2 hours before you are scheduled to arrive at the hospital for your procedure.  Do not drink clear liquids within 2 hours of your scheduled arrival to the hospital.  Clear liquids include  --Water or Apple juice without pulp  --Clear carbohydrate beverage such as ClearFast or Gatorade  --Black Coffee or Clear Tea (No milk, no creamers, do not add anything to                  the coffee or Tea Type 1 and type 2 diabetics should only drink water.   ____Ensure clear carbohydrate drink on the way to the hospital for bariatric patients  ____Ensure clear carbohydrate drink 3 hours before surgery for Dr Dwyane Luo patients if physician instructed.   No gum chewing or hard candies.     __x__ 2. No Alcohol for 24 hours before or after surgery.   __x__3. No Smoking or e-cigarettes for 24 prior to surgery.  Do not use any chewable tobacco products for at least 6 hour prior to surgery   ____  4. Bring all medications with you on the day of surgery if instructed.    __x__ 5. Notify your doctor if there is any change in your medical condition     (cold, fever, infections).    x___6. On the morning of surgery brush your teeth with toothpaste and water.  You may rinse your mouth with mouth wash if you wish.  Do not swallow any toothpaste or mouthwash.   Do not wear jewelry, make-up, hairpins,  clips or nail polish.  Do not wear lotions, powders, or perfumes. You may wear deodorant.  Do not shave 48 hours prior to surgery. Men may shave face and neck.  Do not bring valuables to the hospital.    [facility] is not responsible for any belongings or valuables.               Contacts, dentures or bridgework may not be worn into surgery.  Leave your suitcase in the car. After surgery it may be brought to your room.  For patients admitted to the hospital, discharge time is determined by your                       treatment team.  _  Patients discharged the day of surgery will not be allowed to drive home.  You will need someone to drive you home and stay with you the night of your procedure.    Please read over the following fact sheets that you were given:   [facility] Preparing for Surgery and or MRSA Information   _x___ Take anti-hypertensive listed below, cardiac, seizure, asthma,     anti-reflux and psychiatric medicines. These include:  1. atorvastatin (LIPITOR) 80 MG tablet  2.metoprolol succinate (TOPROL-XL) 50 MG 24 hr tablet  3.omeprazole (PRILOSEC) 40 MG capsule  4.  5.  6.  ____Fleets enema or Magnesium Citrate as directed.   _x___ Use CHG Soap or sage wipes as directed on instruction sheet   ____ Use inhalers on the day of surgery and bring to hospital day of surgery  ____ Stop Metformin and Janumet 2 days prior to surgery.    ____ Take 1/2 of usual insulin dose the night before surgery and none on the morning     surgery.   _x___ Follow recommendations from Cardiologist, Pulmonologist or PCP regarding          stopping Aspirin, Coumadin, Plavix ,Eliquis, Effient, or Pradaxa, and Pletal.  X____Stop Anti-inflammatories such as Advil, Aleve, Ibuprofen, Motrin, Naproxen, Naprosyn, Goodies powders or aspirin products. OK to take Tylenol and                          Celebrex.   _x___ Stop supplements until after surgery.  But may continue Vitamin D, Vitamin B,        and multivitamin.   ____ Bring C-Pap to the hospital.

## 2018-02-19 NOTE — Pre-Procedure Instructions (Signed)
Dr. Izora Ribas aware of UA results.  No further orders.

## 2018-03-03 ENCOUNTER — Encounter
Admission: RE | Disposition: A | Discharge status: Home / Self Care | Payer: Self-pay | Source: Ambulatory Visit | Attending: Neurosurgery

## 2018-03-03 ENCOUNTER — Ambulatory Visit: Payer: Medicare Other | Admitting: Anesthesiology

## 2018-03-03 ENCOUNTER — Encounter: Payer: Self-pay | Admitting: *Deleted

## 2018-03-03 ENCOUNTER — Observation Stay
Admission: RE | Admit: 2018-03-03 | Discharge: 2018-03-04 | Disposition: A | Discharge status: Home / Self Care | Payer: Medicare Other | Source: Ambulatory Visit | Attending: Neurosurgery | Admitting: Neurosurgery

## 2018-03-03 ENCOUNTER — Other Ambulatory Visit: Payer: Self-pay

## 2018-03-03 ENCOUNTER — Ambulatory Visit: Payer: Medicare Other

## 2018-03-03 DIAGNOSIS — M5416 Radiculopathy, lumbar region: Secondary | ICD-10-CM | POA: Diagnosis present

## 2018-03-03 DIAGNOSIS — Z79899 Other long term (current) drug therapy: Secondary | ICD-10-CM | POA: Diagnosis not present

## 2018-03-03 DIAGNOSIS — Z7982 Long term (current) use of aspirin: Secondary | ICD-10-CM | POA: Diagnosis not present

## 2018-03-03 DIAGNOSIS — I10 Essential (primary) hypertension: Secondary | ICD-10-CM | POA: Insufficient documentation

## 2018-03-03 DIAGNOSIS — M5136 Other intervertebral disc degeneration, lumbar region: Secondary | ICD-10-CM | POA: Insufficient documentation

## 2018-03-03 DIAGNOSIS — M48062 Spinal stenosis, lumbar region with neurogenic claudication: Principal | ICD-10-CM | POA: Insufficient documentation

## 2018-03-03 DIAGNOSIS — M5137 Other intervertebral disc degeneration, lumbosacral region: Secondary | ICD-10-CM | POA: Insufficient documentation

## 2018-03-03 DIAGNOSIS — I2510 Atherosclerotic heart disease of native coronary artery without angina pectoris: Secondary | ICD-10-CM | POA: Insufficient documentation

## 2018-03-03 DIAGNOSIS — Z87891 Personal history of nicotine dependence: Secondary | ICD-10-CM | POA: Insufficient documentation

## 2018-03-03 DIAGNOSIS — Z419 Encounter for procedure for purposes other than remedying health state, unspecified: Secondary | ICD-10-CM

## 2018-03-03 HISTORY — PX: LUMBAR LAMINECTOMY/DECOMPRESSION MICRODISCECTOMY: SHX5026

## 2018-03-03 LAB — ABO/RH: ABO/RH(D): O POS

## 2018-03-03 SURGERY — LUMBAR LAMINECTOMY/DECOMPRESSION MICRODISCECTOMY 1 LEVEL
Anesthesia: General | Site: Back

## 2018-03-03 MED ORDER — REMIFENTANIL HCL 1 MG IV SOLR
INTRAVENOUS | Status: AC
  Filled 2018-03-03: qty 1000

## 2018-03-03 MED ORDER — CEFAZOLIN SODIUM-DEXTROSE 2-4 GM/100ML-% IV SOLN
2.0000 g | Freq: Once | INTRAVENOUS | Status: AC
  Administered 2018-03-03 (×2): 2 g via INTRAVENOUS

## 2018-03-03 MED ORDER — OXYCODONE HCL 5 MG PO TABS
5.0000 mg | ORAL_TABLET | ORAL | Status: DC | PRN
  Administered 2018-03-03 – 2018-03-04 (×4): 5 mg via ORAL
  Filled 2018-03-03 (×5): qty 1

## 2018-03-03 MED ORDER — THROMBIN 5000 UNITS EX SOLR
CUTANEOUS | Status: AC
  Filled 2018-03-03: qty 5000

## 2018-03-03 MED ORDER — BUPIVACAINE LIPOSOME 1.3 % IJ SUSP
INTRAMUSCULAR | Status: AC
  Filled 2018-03-03: qty 20

## 2018-03-03 MED ORDER — MIDAZOLAM HCL 2 MG/2ML IJ SOLN
INTRAMUSCULAR | Status: DC | PRN
  Administered 2018-03-03: 2 mg via INTRAVENOUS

## 2018-03-03 MED ORDER — LISINOPRIL 10 MG PO TABS
10.0000 mg | ORAL_TABLET | Freq: Two times a day (BID) | ORAL | Status: DC
  Administered 2018-03-03: 10 mg via ORAL
  Filled 2018-03-03 (×2): qty 1

## 2018-03-03 MED ORDER — ONDANSETRON HCL 4 MG PO TABS: 4.0000 mg | ORAL_TABLET | Freq: Four times a day (QID) | ORAL | Status: DC | PRN

## 2018-03-03 MED ORDER — HYDROMORPHONE HCL 1 MG/ML IJ SOLN: 0.5000 mg | INTRAMUSCULAR | Status: DC | PRN

## 2018-03-03 MED ORDER — FAMOTIDINE 20 MG PO TABS
10.0000 mg | ORAL_TABLET | Freq: Every day | ORAL | Status: DC
  Administered 2018-03-04: 10 mg via ORAL
  Filled 2018-03-03 (×2): qty 1

## 2018-03-03 MED ORDER — SODIUM CHLORIDE 0.9 % IV SOLN
INTRAVENOUS | Status: DC
  Administered 2018-03-03 – 2018-03-04 (×2): via INTRAVENOUS

## 2018-03-03 MED ORDER — SODIUM CHLORIDE 0.9% FLUSH
3.0000 mL | Freq: Two times a day (BID) | INTRAVENOUS | Status: DC
  Administered 2018-03-03 – 2018-03-04 (×2): 3 mL via INTRAVENOUS

## 2018-03-03 MED ORDER — DEXMEDETOMIDINE HCL 200 MCG/2ML IV SOLN
INTRAVENOUS | Status: DC | PRN
  Administered 2018-03-03 (×6): 4 ug via INTRAVENOUS

## 2018-03-03 MED ORDER — SODIUM CHLORIDE 0.9 % IV SOLN
INTRAVENOUS | Status: DC | PRN
  Administered 2018-03-03: 40 mL

## 2018-03-03 MED ORDER — REMIFENTANIL HCL 1 MG IV SOLR
INTRAVENOUS | Status: DC | PRN
  Administered 2018-03-03: .2 ug/kg/min via INTRAVENOUS

## 2018-03-03 MED ORDER — METHOCARBAMOL 1000 MG/10ML IJ SOLN
500.0000 mg | Freq: Four times a day (QID) | INTRAVENOUS | Status: DC | PRN
  Filled 2018-03-03: qty 5

## 2018-03-03 MED ORDER — ACETAMINOPHEN 10 MG/ML IV SOLN
INTRAVENOUS | Status: AC
  Filled 2018-03-03: qty 100

## 2018-03-03 MED ORDER — METHYLPREDNISOLONE ACETATE 40 MG/ML IJ SUSP
INTRAMUSCULAR | Status: DC | PRN
  Administered 2018-03-03: 40 mg

## 2018-03-03 MED ORDER — METHYLPREDNISOLONE ACETATE 40 MG/ML IJ SUSP
INTRAMUSCULAR | Status: AC
  Filled 2018-03-03: qty 1

## 2018-03-03 MED ORDER — ACETAMINOPHEN 325 MG PO TABS: 325.0000 mg | ORAL_TABLET | ORAL | Status: DC | PRN

## 2018-03-03 MED ORDER — TAMSULOSIN HCL 0.4 MG PO CAPS
0.4000 mg | ORAL_CAPSULE | Freq: Every day | ORAL | Status: DC
  Administered 2018-03-03: 0.4 mg via ORAL
  Filled 2018-03-03: qty 1

## 2018-03-03 MED ORDER — FENTANYL CITRATE (PF) 100 MCG/2ML IJ SOLN
INTRAMUSCULAR | Status: DC | PRN
  Administered 2018-03-03: 50 ug via INTRAVENOUS
  Administered 2018-03-03: 100 ug via INTRAVENOUS
  Administered 2018-03-03: 50 ug via INTRAVENOUS

## 2018-03-03 MED ORDER — SODIUM CHLORIDE 0.9 % IV SOLN: 250.0000 mL | INTRAVENOUS | Status: DC

## 2018-03-03 MED ORDER — KETAMINE HCL 50 MG/ML IJ SOLN
INTRAMUSCULAR | Status: AC
  Filled 2018-03-03: qty 10

## 2018-03-03 MED ORDER — BUPIVACAINE-EPINEPHRINE (PF) 0.5% -1:200000 IJ SOLN
INTRAMUSCULAR | Status: DC | PRN
  Administered 2018-03-03: 20 mL

## 2018-03-03 MED ORDER — SODIUM CHLORIDE 0.9 % IV SOLN
INTRAVENOUS | Status: DC | PRN
  Administered 2018-03-03: 50 ug/min via INTRAVENOUS

## 2018-03-03 MED ORDER — SODIUM CHLORIDE (PF) 0.9 % IJ SOLN
INTRAMUSCULAR | Status: AC
  Filled 2018-03-03: qty 10

## 2018-03-03 MED ORDER — THROMBIN 5000 UNITS EX SOLR
CUTANEOUS | Status: DC | PRN
  Administered 2018-03-03: 5000 [IU] via TOPICAL

## 2018-03-03 MED ORDER — ONDANSETRON HCL 4 MG/2ML IJ SOLN
INTRAMUSCULAR | Status: DC | PRN
  Administered 2018-03-03 (×2): 4 mg via INTRAVENOUS

## 2018-03-03 MED ORDER — BUPIVACAINE HCL 0.5 % IJ SOLN
INTRAMUSCULAR | Status: DC | PRN
  Administered 2018-03-03: 8 mL

## 2018-03-03 MED ORDER — METOPROLOL SUCCINATE ER 50 MG PO TB24
50.0000 mg | ORAL_TABLET | Freq: Every day | ORAL | Status: DC
  Administered 2018-03-04: 50 mg via ORAL
  Filled 2018-03-03: qty 1

## 2018-03-03 MED ORDER — VASOPRESSIN 20 UNIT/ML IV SOLN
INTRAVENOUS | Status: AC
  Filled 2018-03-03: qty 1

## 2018-03-03 MED ORDER — ACETAMINOPHEN 325 MG PO TABS: 650.0000 mg | ORAL_TABLET | ORAL | Status: DC | PRN

## 2018-03-03 MED ORDER — ROCURONIUM BROMIDE 100 MG/10ML IV SOLN
INTRAVENOUS | Status: DC | PRN
  Administered 2018-03-03: 10 mg via INTRAVENOUS

## 2018-03-03 MED ORDER — HYDROCODONE-ACETAMINOPHEN 7.5-325 MG PO TABS: 1.0000 | ORAL_TABLET | Freq: Once | ORAL | Status: DC | PRN

## 2018-03-03 MED ORDER — ACETAMINOPHEN 160 MG/5ML PO SOLN
325.0000 mg | ORAL | Status: DC | PRN
  Filled 2018-03-03: qty 20.3

## 2018-03-03 MED ORDER — MEPERIDINE HCL 50 MG/ML IJ SOLN: 6.2500 mg | INTRAMUSCULAR | Status: DC | PRN

## 2018-03-03 MED ORDER — ACETAMINOPHEN 500 MG PO TABS
1000.0000 mg | ORAL_TABLET | Freq: Four times a day (QID) | ORAL | Status: AC
  Administered 2018-03-03 – 2018-03-04 (×3): 1000 mg via ORAL
  Filled 2018-03-03 (×3): qty 2

## 2018-03-03 MED ORDER — LIDOCAINE HCL (PF) 2 % IJ SOLN
INTRAMUSCULAR | Status: AC
  Filled 2018-03-03: qty 10

## 2018-03-03 MED ORDER — BUPIVACAINE-EPINEPHRINE (PF) 0.5% -1:200000 IJ SOLN
INTRAMUSCULAR | Status: AC
  Filled 2018-03-03: qty 30

## 2018-03-03 MED ORDER — SENNOSIDES-DOCUSATE SODIUM 8.6-50 MG PO TABS: 1.0000 | ORAL_TABLET | Freq: Every evening | ORAL | Status: DC | PRN

## 2018-03-03 MED ORDER — MIDAZOLAM HCL 2 MG/2ML IJ SOLN
INTRAMUSCULAR | Status: AC
  Filled 2018-03-03: qty 2

## 2018-03-03 MED ORDER — LACTATED RINGERS IV SOLN
INTRAVENOUS | Status: DC
  Administered 2018-03-03 (×2): via INTRAVENOUS

## 2018-03-03 MED ORDER — ONDANSETRON HCL 4 MG/2ML IJ SOLN: 4.0000 mg | Freq: Four times a day (QID) | INTRAMUSCULAR | Status: DC | PRN

## 2018-03-03 MED ORDER — SODIUM CHLORIDE 0.9% FLUSH: 3.0000 mL | INTRAVENOUS | Status: DC | PRN

## 2018-03-03 MED ORDER — PROPOFOL 10 MG/ML IV BOLUS
INTRAVENOUS | Status: AC
  Filled 2018-03-03: qty 40

## 2018-03-03 MED ORDER — SODIUM CHLORIDE (PF) 0.9 % IJ SOLN
INTRAMUSCULAR | Status: AC
  Filled 2018-03-03: qty 50

## 2018-03-03 MED ORDER — PHENOL 1.4 % MT LIQD
1.0000 | OROMUCOSAL | Status: DC | PRN
  Filled 2018-03-03: qty 177

## 2018-03-03 MED ORDER — ACETAMINOPHEN 10 MG/ML IV SOLN: 1000.0000 mg | Freq: Once | INTRAVENOUS | Status: DC | PRN

## 2018-03-03 MED ORDER — LIDOCAINE HCL (CARDIAC) PF 100 MG/5ML IV SOSY
PREFILLED_SYRINGE | INTRAVENOUS | Status: DC | PRN
  Administered 2018-03-03: 60 mg via INTRAVENOUS

## 2018-03-03 MED ORDER — SUCCINYLCHOLINE CHLORIDE 20 MG/ML IJ SOLN
INTRAMUSCULAR | Status: DC | PRN
  Administered 2018-03-03: 100 mg via INTRAVENOUS

## 2018-03-03 MED ORDER — MENTHOL 3 MG MT LOZG
1.0000 | LOZENGE | OROMUCOSAL | Status: DC | PRN
  Filled 2018-03-03: qty 9

## 2018-03-03 MED ORDER — ACETAMINOPHEN 10 MG/ML IV SOLN
INTRAVENOUS | Status: DC | PRN
  Administered 2018-03-03: 1000 mg via INTRAVENOUS

## 2018-03-03 MED ORDER — BUPIVACAINE HCL (PF) 0.5 % IJ SOLN
INTRAMUSCULAR | Status: AC
  Filled 2018-03-03: qty 30

## 2018-03-03 MED ORDER — OXYCODONE HCL 5 MG PO TABS: 10.0000 mg | ORAL_TABLET | ORAL | Status: DC | PRN

## 2018-03-03 MED ORDER — DEXMEDETOMIDINE HCL IN NACL 200 MCG/50ML IV SOLN
INTRAVENOUS | Status: AC
  Filled 2018-03-03: qty 50

## 2018-03-03 MED ORDER — KETAMINE HCL 10 MG/ML IJ SOLN
INTRAMUSCULAR | Status: DC | PRN
  Administered 2018-03-03: 20 mg via INTRAVENOUS
  Administered 2018-03-03: 10 mg via INTRAVENOUS
  Administered 2018-03-03: 20 mg via INTRAVENOUS
  Administered 2018-03-03: 30 mg via INTRAVENOUS

## 2018-03-03 MED ORDER — DEXAMETHASONE SODIUM PHOSPHATE 4 MG/ML IJ SOLN
4.0000 mg | Freq: Three times a day (TID) | INTRAMUSCULAR | Status: AC
  Administered 2018-03-03 – 2018-03-04 (×3): 4 mg via INTRAVENOUS
  Filled 2018-03-03 (×3): qty 1

## 2018-03-03 MED ORDER — DEXAMETHASONE SODIUM PHOSPHATE 10 MG/ML IJ SOLN
INTRAMUSCULAR | Status: AC
  Filled 2018-03-03: qty 1

## 2018-03-03 MED ORDER — EPHEDRINE SULFATE 50 MG/ML IJ SOLN
INTRAMUSCULAR | Status: DC | PRN
  Administered 2018-03-03: 10 mg via INTRAVENOUS
  Administered 2018-03-03: 15 mg via INTRAVENOUS

## 2018-03-03 MED ORDER — PROMETHAZINE HCL 25 MG/ML IJ SOLN: 6.2500 mg | INTRAMUSCULAR | Status: DC | PRN

## 2018-03-03 MED ORDER — PHENYLEPHRINE HCL 10 MG/ML IJ SOLN
INTRAMUSCULAR | Status: DC | PRN
  Administered 2018-03-03: 200 ug via INTRAVENOUS
  Administered 2018-03-03: 100 ug via INTRAVENOUS

## 2018-03-03 MED ORDER — SODIUM CHLORIDE FLUSH 0.9 % IV SOLN
INTRAVENOUS | Status: AC
  Filled 2018-03-03: qty 10

## 2018-03-03 MED ORDER — ACETAMINOPHEN 650 MG RE SUPP: 650.0000 mg | RECTAL | Status: DC | PRN

## 2018-03-03 MED ORDER — PROPOFOL 10 MG/ML IV BOLUS
INTRAVENOUS | Status: DC | PRN
  Administered 2018-03-03: 150 mg via INTRAVENOUS

## 2018-03-03 MED ORDER — HYDROMORPHONE HCL 1 MG/ML IJ SOLN: 0.2500 mg | INTRAMUSCULAR | Status: DC | PRN

## 2018-03-03 MED ORDER — PANTOPRAZOLE SODIUM 40 MG PO TBEC
40.0000 mg | DELAYED_RELEASE_TABLET | Freq: Every day | ORAL | Status: DC
  Administered 2018-03-04: 40 mg via ORAL
  Filled 2018-03-03: qty 1

## 2018-03-03 MED ORDER — FENTANYL CITRATE (PF) 250 MCG/5ML IJ SOLN
INTRAMUSCULAR | Status: AC
  Filled 2018-03-03: qty 5

## 2018-03-03 MED ORDER — SODIUM CHLORIDE 0.9 % IR SOLN
Status: DC | PRN
  Administered 2018-03-03: 1000 mL

## 2018-03-03 MED ORDER — CEFAZOLIN SODIUM-DEXTROSE 2-4 GM/100ML-% IV SOLN
INTRAVENOUS | Status: AC
  Filled 2018-03-03: qty 100

## 2018-03-03 MED ORDER — EVICEL 2 ML EX KIT
PACK | CUTANEOUS | Status: DC | PRN
  Administered 2018-03-03: 2 mL

## 2018-03-03 MED ORDER — DEXAMETHASONE SODIUM PHOSPHATE 10 MG/ML IJ SOLN
INTRAMUSCULAR | Status: DC | PRN
  Administered 2018-03-03: 4 mg via INTRAVENOUS

## 2018-03-03 MED ORDER — METHOCARBAMOL 500 MG PO TABS: 500.0000 mg | ORAL_TABLET | Freq: Four times a day (QID) | ORAL | Status: DC | PRN

## 2018-03-03 MED ORDER — ATORVASTATIN CALCIUM 20 MG PO TABS
80.0000 mg | ORAL_TABLET | Freq: Every day | ORAL | Status: DC
  Administered 2018-03-04: 80 mg via ORAL
  Filled 2018-03-03: qty 4

## 2018-03-03 SURGICAL SUPPLY — 59 items
BUR NEURO DRILL SOFT 3.0X3.8M (BURR) ×4 IMPLANT
CANISTER SUCT 1200ML W/VALVE (MISCELLANEOUS) ×4 IMPLANT
CHLORAPREP W/TINT 26ML (MISCELLANEOUS) ×4 IMPLANT
CNTNR SPEC 2.5X3XGRAD LEK (MISCELLANEOUS) ×1
CONT SPEC 4OZ STER OR WHT (MISCELLANEOUS) ×1
CONTAINER SPEC 2.5X3XGRAD LEK (MISCELLANEOUS) ×1 IMPLANT
COUNTER NEEDLE 20/40 LG (NEEDLE) ×2 IMPLANT
COVER LIGHT HANDLE STERIS (MISCELLANEOUS) ×4 IMPLANT
COVER WAND RF STERILE (DRAPES) ×2 IMPLANT
CUP MEDICINE 2OZ PLAST GRAD ST (MISCELLANEOUS) ×4 IMPLANT
DERMABOND ADVANCED (GAUZE/BANDAGES/DRESSINGS) ×1
DERMABOND ADVANCED .7 DNX12 (GAUZE/BANDAGES/DRESSINGS) ×1 IMPLANT
DRAPE C-ARM 42X72 X-RAY (DRAPES) ×4 IMPLANT
DRAPE LAPAROTOMY 100X77 ABD (DRAPES) ×2 IMPLANT
DRAPE MICROSCOPE SPINE 48X150 (DRAPES) ×2 IMPLANT
DRAPE POUCH INSTRU U-SHP 10X18 (DRAPES) ×2 IMPLANT
DRAPE SURG 17X11 SM STRL (DRAPES) ×8 IMPLANT
DRSG TEGADERM 4X4.75 (GAUZE/BANDAGES/DRESSINGS) ×2 IMPLANT
DRSG TELFA 4X3 1S NADH ST (GAUZE/BANDAGES/DRESSINGS) ×2 IMPLANT
ELECT CAUTERY BLADE TIP 2.5 (TIP) ×2
ELECT EZSTD 165MM 6.5IN (MISCELLANEOUS)
ELECT REM PT RETURN 9FT ADLT (ELECTROSURGICAL) ×2
ELECTRODE CAUTERY BLDE TIP 2.5 (TIP) ×1 IMPLANT
ELECTRODE EZSTD 165MM 6.5IN (MISCELLANEOUS) IMPLANT
ELECTRODE REM PT RTRN 9FT ADLT (ELECTROSURGICAL) ×1 IMPLANT
FRAME EYE SHIELD (PROTECTIVE WEAR) ×4 IMPLANT
GLOVE BIOGEL PI IND STRL 7.0 (GLOVE) ×1 IMPLANT
GLOVE BIOGEL PI INDICATOR 7.0 (GLOVE) ×1
GLOVE SURG SYN 7.0 (GLOVE) ×4 IMPLANT
GLOVE SURG SYN 8.5  E (GLOVE) ×3
GLOVE SURG SYN 8.5 E (GLOVE) ×3 IMPLANT
GOWN SRG XL LVL 3 NONREINFORCE (GOWNS) ×1 IMPLANT
GOWN STRL NON-REIN TWL XL LVL3 (GOWNS) ×1
GOWN STRL REUS W/TWL MED LVL3 (GOWN DISPOSABLE) ×2 IMPLANT
GRADUATE 1200CC STRL 31836 (MISCELLANEOUS) ×2 IMPLANT
GRAFT DURAGEN MATRIX 1WX1L (Tissue) ×2 IMPLANT
KIT SPINAL PRONEVIEW (KITS) ×2 IMPLANT
KNIFE BAYONET SHORT DISCETOMY (MISCELLANEOUS) IMPLANT
MARKER SKIN DUAL TIP RULER LAB (MISCELLANEOUS) ×2 IMPLANT
NDL SAFETY ECLIPSE 18X1.5 (NEEDLE) ×1 IMPLANT
NEEDLE HYPO 18GX1.5 SHARP (NEEDLE) ×1
NEEDLE HYPO 22GX1.5 SAFETY (NEEDLE) ×2 IMPLANT
NS IRRIG 1000ML POUR BTL (IV SOLUTION) ×2 IMPLANT
PACK LAMINECTOMY NEURO (CUSTOM PROCEDURE TRAY) ×2 IMPLANT
PAD ARMBOARD 7.5X6 YLW CONV (MISCELLANEOUS) ×2 IMPLANT
SPOGE SURGIFLO 8M (HEMOSTASIS) ×1
SPONGE SURGIFLO 8M (HEMOSTASIS) ×1 IMPLANT
SUT DVC VLOC 3-0 CL 6 P-12 (SUTURE) ×2 IMPLANT
SUT VIC AB 0 CT1 27 (SUTURE) ×1
SUT VIC AB 0 CT1 27XCR 8 STRN (SUTURE) ×1 IMPLANT
SUT VIC AB 2-0 CT1 18 (SUTURE) ×2 IMPLANT
SYR 10ML LL (SYRINGE) ×2 IMPLANT
SYR 20CC LL (SYRINGE) ×2 IMPLANT
SYR 30ML LL (SYRINGE) ×4 IMPLANT
SYR 3ML LL SCALE MARK (SYRINGE) ×2 IMPLANT
TOWEL OR 17X26 4PK STRL BLUE (TOWEL DISPOSABLE) ×6 IMPLANT
TUBE MATRX SPINL 18MM 6CM DISP (INSTRUMENTS) ×1
TUBE METRX SPINAL 18X6 DISP (INSTRUMENTS) ×1 IMPLANT
TUBING CONNECTING 10 (TUBING) ×2 IMPLANT

## 2018-03-03 NOTE — Transfer of Care (Signed)
Immediate Anesthesia Transfer of Care Note  Patient: Blake [name]  Procedure(s) Performed: LUMBAR LAMINECTOMY/DECOMPRESSION MICRODISCECTOMY 1 LEVEL-L3-4,L4-5 (N/A Back)  Patient Location: PACU  Anesthesia Type:General  Level of Consciousness: drowsy  Airway & Oxygen Therapy: Patient connected to face mask oxygen  Post-op Assessment: Report given to RN  Post vital signs: stable  Last Vitals:  Vitals Value Taken Time  BP 125/86 03/03/2018 12:15 PM  Temp 36.7 C 03/03/2018 12:15 PM  Pulse 107 03/03/2018 12:22 PM  Resp 21 03/03/2018 12:22 PM  SpO2 100 % 03/03/2018 12:22 PM  Vitals shown include unvalidated device data.  Last Pain:  Vitals:   03/03/18 0601  TempSrc: Tympanic  PainSc: 8          Complications: No apparent anesthesia complications

## 2018-03-03 NOTE — H&P (Signed)
History of Present Illness: 03/03/2018  I saw [name] this morning, and confirmed the key details listed below.  [name] has had worsening symptoms since the last time I saw him.  He is having symptoms worse in his R leg than the left leg at this point.  06/11/2017  [name] returns today. He was on vacation for a month, and did have reasonable control of the symptoms at that time. He has had an injection which helped for a period of time. His symptoms have since returned. He has a injection scheduled for Monday. He returns today to discuss surgical options. Otherwise his symptoms are consistent with what is listed below.  04/13/2017 [name] is here today with a chief complaint of left low back pain, some left leg pain. He originally had R lower back and leg pain in late 2018, which improved with medications and therapy.  Since early December 2018, he has begun having LLE pain.  Duration: 1 month Location: L lateral and anteromedial calf Quality: constant Severity: 2  Precipitating: aggravated by walking (hurts more than across the street) Modifying factors: made better by meloxicam and gabapentin Weakness: none Timing: none Bowel/Bladder Dysfunction: none  Conservative measures:  Physical therapy: tried PT in October 2018 Multimodal medical therapy including regular antiinflammatories: etodolac, meloxicam, gabapentin, methocarbamol, prednisone (helps) Injections: has not tried epidural steroid injections  Past Surgery: denies  Blake [name] has no symptoms of cervical myelopathy.  The symptoms are causing a significant impact on the patient's life.   Review of Systems:  A 10 point review of systems is negative, except for the pertinent positives and negatives detailed in the HPI. Past Medical History: Past Medical History:  Diagnosis Date  . Benign essential hypertension 07/21/2014  . Bilateral carotid artery stenosis 06/05/2016  50-69% bilateral 2018  . Coronary  artery disease 06/26/2014  Drug eluding stent 100% lesion in the mid LAD. Occluded with poor collaterals. 1st diagonal 25% stenosis. 06/14/14  . GERD (gastroesophageal reflux disease)  . Hyperlipidemia, unspecified  . Hypertension  . Osteoarthritis  carpal tunnel, hands  . 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 JOINT REPLACEMENT Right 01/24/2013  RIGHT TOTAL HIP ARTHROPLASTY  . Tongue biopsy surgery  . Trigger finger release Right 2013   Allergies: Allergies as of 06/11/2017 - Reviewed 06/11/2017  Allergen Reaction Noted  . Sulfa (sulfonamide antibiotics) Rash 09/05/2013   Medications: Outpatient Encounter Medications as of 06/11/2017  Medication Sig Dispense Refill  . aspirin 325 MG buffered tablet Take 325 mg by mouth once daily.  [name] Kitchen atorvastatin (LIPITOR) 80 MG tablet Take 1 tablet (80 mg total) by mouth once daily. 90 tablet 1  . co-enzyme Q-10, ubiquinone, 30 mg capsule Take by mouth.  . gabapentin (NEURONTIN) 300 MG capsule Take 300 mg by mouth once daily  . lisinopril (PRINIVIL,ZESTRIL) 2.5 MG tablet Take 1 tablet (2.5 mg total) by mouth once daily. 90 tablet 0  . meloxicam (MOBIC) 15 MG tablet Take 15 mg by mouth once daily 1  . methocarbamol (ROBAXIN) 500 MG tablet Take 500 mg by mouth nightly as needed  . metoprolol succinate (TOPROL-XL) 50 MG XL tablet Take 1 tablet (50 mg total) by mouth once daily. 90 tablet 3  . omega-3 acid ethyl esters (LOVAZA) 1 gram capsule Take by mouth.  [name] Kitchen omeprazole (PRILOSEC) 40 MG DR capsule Take 1 capsule (40 mg total) by mouth once  daily 90 capsule 1  . tamsulosin (FLOMAX) 0.4 mg capsule TAKE 1 CAPSULE BY MOUTH ONCE DAILY. TAKE 30 MINUTES AFTER SAME MEAL EACH DAY. 90 capsule 3   No facility-administered encounter medications on file as of 06/11/2017.   Social History: Social History   Tobacco Use  . Smoking status: Former Research scientist (life sciences)  . Smokeless  tobacco: Never Used  Substance Use Topics  . Alcohol use: Yes  Alcohol/week: 0.0 oz  Comment: MODERATE  . Drug use: No   Family Medical History: Family History  Problem Relation Age of Onset  . Heart failure Mother  . Lung cancer Father   Physical Examination: Vitals:   Vitals:   03/03/18 0601  BP: (!) 153/92  Pulse: 88  Resp: 16  Temp: (!) 97.4 F (36.3 C)  SpO2: 96%     General: Patient is well developed, well nourished, calm, collected, and in no apparent distress. Attention to examination is appropriate.  Psychiatric: Patient is non-anxious.  Head: Pupils equal, round, and reactive to light.  ENT: Oral mucosa appears well hydrated.  Neck: Supple. Full range of motion.  Respiratory: Patient is breathing without any difficulty.  Extremities: No edema.  Vascular: Palpable dorsal pedal pulses.  Skin: On exposed skin, there are no abnormal skin lesions.  Heart sounds normal no MRG. Chest Clear to Auscultation Bilaterally.   NEUROLOGICAL:  General: In no acute distress.  Awake, alert, oriented to person, place, and time. Speech is clear and fluent. Fund of knowledge is appropriate.   Cranial Nerves: Pupils equal round and reactive to light. Facial tone is symmetric. Facial sensation is symmetric. Shoulder shrug is symmetric. Tongue protrusion is midline. There is no pronator drift.  ROM of spine: full. Palpation of spine: non tender.   Strength: Side Biceps Triceps Deltoid Interossei Grip Wrist Ext. Wrist Flex.  R 5 5 5 5 5 5 5   L 5 5 5 5 5 5 5    Side Iliopsoas Quads Hamstring PF DF EHL  R 5 5 5 5 5 5   L 5 5 5 5 5 5    Reflexes are 1+ and symmetric at the biceps, triceps, brachioradialis, patella and achilles. Bilateral upper and lower extremity sensation is intact to light touch. Clonus is not present. Toes are down-going. Gait is slowed but normal. Mild difficulty with tandem gait. Hoffman's is absent.  Rapid alternating movements are normal.    Medical Decision Making  Imaging: MRI L spine 04/02/17  IMPRESSION: Mild spinal stenosis L2-3  Moderate spinal stenosis L3-4. Subarticular foraminal stenosis left greater than right  Severe spinal stenosis L4-5. Subarticular foraminal stenosis left greater than right. 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  Electronically Signed ByFrederik Pear M.D. On: 04/02/2017 14:01  I have personally reviewed the images and agree with the above interpretation.  Assessment and Plan: Mr. Bohanon is a pleasant 72 y.o. 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  He would like to move forward with L3-5 minimally invasive decompression based on his failure of conservative management.  Risks and benefits reviewed.    Meade Maw MD, [facility] Department of Neurosurgery

## 2018-03-03 NOTE — Op Note (Signed)
Indications: [name] is a 72 yo male with history of lumbar stenosis with neurogenic claudication.  He failed conservative management and elected for surgical intervention  Findings: severe stenosis  Preoperative Diagnosis: Lumbar Stenosis with neurogenic claudication Postoperative Diagnosis: same   EBL: 50 ml IVF: 1300 ml Drains: none Disposition: Extubated and Stable to PACU Complications: none  No foley catheter was placed.   Preoperative Note:   Risks of surgery discussed include: infection, bleeding, stroke, coma, death, paralysis, CSF leak, nerve/spinal cord injury, numbness, tingling, weakness, complex regional pain syndrome, recurrent stenosis and/or disc herniation, vascular injury, development of instability, neck/back pain, need for further surgery, persistent symptoms, development of deformity, and the risks of anesthesia. The patient understood these risks and agreed to proceed.  Operative Note:   1. L3-5 lumbar decompression including central laminectomy and bilateral medial facetectomies including foraminotomies  The patient was then brought from the preoperative center with intravenous access established.  The patient underwent general anesthesia and endotracheal tube intubation, and was then rotated on the [place] rail top where all pressure points were appropriately padded.  The skin was then thoroughly cleansed.  Perioperative antibiotic prophylaxis was administered.  Sterile prep and drapes were then applied and a timeout was then observed.  C-arm was brought into the field under sterile conditions and under lateral visualization the L3-4 and L4-5 interspaces were identified and marked.  The incision was marked on the right and injected with local anesthetic. Once this was complete a 4 cm incision was opened with the use of a #10 blade knife.    The metrx tubes were sequentially advanced and confirmed in position at L4-5. An 20mm by 78mm tube was locked in place to  the bed side attachment.  The microscope was then sterilely brought into the field and muscle creep was hemostased with a bipolar and resected with a pituitary rongeur.  A Bovie extender was then used to expose the spinous process and lamina.  Careful attention was placed to not violate the facet capsule. A 3 mm matchstick drill bit was then used to make a hemi-laminotomy trough until the ligamentum flavum was exposed.  This was extended to the base of the spinous process and to the contralateral side to remove all the central bone from each side.  Once this was complete and the underlying ligamentum flavum was visualized, it was dissected with a curette and resected with Kerrison rongeurs.  Extensive ligamentum hypertrophy was noted, requiring a substantial amount of time and care for removal.  The dura was identified and palpated.   During removal of the leading edge of L5, a small linear durotomy was noted with egress of CSF.  To fully identify the leak, a substantial portion of the R L5 lamina was removed to allow complete visualization.  A single 6-0 prolene in interrupted fashion was then placed, whereupon no leakage of fluid was noted.  We then completed the L4-5 decompression.  The kerrison rongeur was then used to remove the medial facet bilaterally until no compression was noted.  A balltip probe was used to confirm decompression of the ipsilateral L5 nerve root.  Additional attention was paid to completion of the contralateral L4-5 foraminotomy until the contralateral traversing nerve root was completely free.  Once this was complete, L4-5 central decompression including medial facetectomy and foraminotomy was confirmed and decompression on both sides was confirmed. No additional CSF leak was noted.  A Depo-Medrol soaked Gelfoam pledget was placed in the defect.  The wound was  copiously irrigated. Duragen and Tisseel were placed to augment the closure.  The tube system was then removed under  microscopic visualization and hemostasis was obtained with a bipolar.    After performing the decompression at L4-5, the metrx tubes were sequentially advanced and confirmed in position at L3-4. An 48mm by 66mm tube was locked in place to the bed side attachment.  Fluoroscopy was then removed from the field.  The microscope was then sterilely brought into the field and muscle creep was hemostased with a bipolar and resected with a pituitary rongeur.  A Bovie extender was then used to expose the spinous process and lamina.  Careful attention was placed to not violate the facet capsule. A 3 mm matchstick drill bit was then used to make a hemi-laminotomy trough until the ligamentum flavum was exposed.  This was extended to the base of the spinous process and to the contralateral side to remove all the central bone from each side.  Once this was complete and the underlying ligamentum flavum was visualized, it was dissected with a curette and resected with Kerrison rongeurs.  Extensive ligamentum hypertrophy was noted, requiring a substantial amount of time and care for removal.  The dura was identified and palpated. The kerrison rongeur was then used to remove the medial facet bilaterally until no compression was noted.  A balltip probe was used to confirm decompression of the ipsilateral L4 nerve root.  Additional attention was paid to completion of the contralateral foraminotomy until the contralateral L4 nerve root was completely free.  Once this was complete, L3-4 central decompression including medial facetectomy and foraminotomy was confirmed and decompression on both sides was confirmed. No CSF leak was noted at this level.  A Depo-Medrol soaked Gelfoam pledget was placed in the defect.  The wound was copiously irrigated. The tube system was then removed under microscopic visualization and hemostasis was obtained with a bipolar.     The fascial layer was reapproximated with the use of a 0 Vicryl suture.   Subcutaneous tissue layer was reapproximated using 2-0 Vicryl suture.  3-0 nylon was used on the skin. The skin was then cleansed and Dermabond was used to close the skin opening.  Patient was then rotated back to the preoperative bed awakened from anesthesia and taken to recovery all counts are correct in this case.  I performed the entire procedure with the assistance of Marin Olp PA as an Pensions consultant.  Jahmad Petrich K. Izora Ribas MD

## 2018-03-03 NOTE — Anesthesia Procedure Notes (Signed)
Procedure Name: MAC Date/Time: 03/03/2018 7:32 AM Performed by: Alphonsus Sias, MD Pre-anesthesia Checklist: Patient identified, Emergency Drugs available, Suction available, Timeout performed and Patient being monitored Patient Re-evaluated:Patient Re-evaluated prior to induction Oxygen Delivery Method: Circle system utilized Preoxygenation: Pre-oxygenation with 100% oxygen Induction Type: IV induction Ventilation: Mask ventilation without difficulty Laryngoscope Size: Mac and 4 Grade View: Grade II Tube type: Oral Tube size: 7.5 mm Number of attempts: 1 Airway Equipment and Method: Stylet Placement Confirmation: ETT inserted through vocal cords under direct vision,  positive ETCO2 and breath sounds checked- equal and bilateral Secured at: 21 cm Tube secured with: Tape Dental Injury: Teeth and Oropharynx as per pre-operative assessment

## 2018-03-03 NOTE — Progress Notes (Signed)
Procedure: L3-4, L4-5 lumbar laminectomy Procedure date: 03/03/2018 Diagnosis: Neurogenic claudication  History: Blake [name] is POD 0 status post L3-4 and L4-5 lumbar decompression for neurogenic claudication symptoms.  Evaluated in postop recovery and doing well.  Still disoriented from anesthesia but able to answer question of weakness.  Denies any pain at this time including lower extremity pain, back pain.  Denies any new numbness or tingling lower extremities.  Physical Exam: Vitals:   03/03/18 1230 03/03/18 1245  BP: (!) 148/90 (!) 152/82  Pulse: 96 91  Resp: 14 13  Temp:    SpO2: 96% 95%    AA Ox3 Strength:5/5 throughout lower extremities Sensation: Intact and symmetric throughout lower extremities  Data:  No results for input(s): NA, K, CL, CO2, BUN, CREATININE, LABGLOM, GLUCOSE, CALCIUM in the last 168 hours. No results for input(s): AST, ALT, ALKPHOS in the last 168 hours.  Invalid input(s): TBILI   No results for input(s): WBC, HGB, HCT, PLT in the last 168 hours. No results for input(s): APTT, INR in the last 168 hours.       Other tests/results: No imaging reviewed  Assessment/Plan:  Blake [name] POD 0 status post L3-4 and L4-5 lumbar laminectomies for neurogenic claudication.  Experienced CSF leak during surgical procedure, so we will continue to observe with positioning orders to lay flat.  - mobilize - pain control - DVT prophylaxis  Marin Olp PA-C Department of Neurosurgery

## 2018-03-03 NOTE — Anesthesia Post-op Follow-up Note (Signed)
Anesthesia QCDR form completed.        

## 2018-03-03 NOTE — Anesthesia Preprocedure Evaluation (Signed)
Anesthesia Evaluation  Patient identified by MRN, date of birth, ID band Patient awake    Reviewed: Allergy & Precautions, H&P , NPO status , reviewed documented beta blocker date and time   Airway Mallampati: II  TM Distance: >3 FB Neck ROM: limited    Dental  (+) Caps   Pulmonary shortness of breath, former smoker,    Pulmonary exam normal        Cardiovascular hypertension, Normal cardiovascular exam     Neuro/Psych  Neuromuscular disease    GI/Hepatic GERD  Controlled,  Endo/Other  diabetes  Renal/GU      Musculoskeletal  (+) Arthritis ,   Abdominal   Peds  Hematology   Anesthesia Other Findings Past Medical History: No date: Back pain     Comment:  with leg pain 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 No date: Dyspnea No date: GERD (gastroesophageal reflux disease) No date: Hyperlipidemia No date: Hypertension No date: Numbness and tingling of both lower extremities     Comment:  with positioning No date: Prostate enlargement Past Surgical History: 12/08/2003: COLONOSCOPY 2014: PARTIAL HIP ARTHROPLASTY No date: PORT-A-CATH REMOVAL 2006: TONGUE BIOPSY No date: TONSILLECTOMY 01/25/14: UPPER GI ENDOSCOPY     Comment:  multiple gastric polyps   Reproductive/Obstetrics                             Anesthesia Physical Anesthesia Plan  ASA: III  Anesthesia Plan: General ETT   Post-op Pain Management:    Induction: Intravenous  PONV Risk Score and Plan: 3 and Ondansetron, Treatment may vary due to age or medical condition, Midazolam and Dexamethasone  Airway Management Planned: Oral ETT  Additional Equipment:   Intra-op Plan:   Post-operative Plan: Extubation in OR  Informed Consent: I have reviewed the patients History and Physical, chart, labs and discussed the procedure including the risks, benefits and alternatives for the proposed anesthesia with the patient or  authorized representative who has indicated his/her understanding and acceptance.   Dental Advisory Given  Plan Discussed with: CRNA  Anesthesia Plan Comments:         Anesthesia Quick Evaluation

## 2018-03-04 ENCOUNTER — Encounter: Payer: Self-pay | Admitting: Neurosurgery

## 2018-03-04 DIAGNOSIS — M48062 Spinal stenosis, lumbar region with neurogenic claudication: Secondary | ICD-10-CM | POA: Diagnosis not present

## 2018-03-04 MED ORDER — METHOCARBAMOL 500 MG PO TABS: 500.0000 mg | ORAL_TABLET | Freq: Four times a day (QID) | ORAL | 0 refills | Status: DC | PRN

## 2018-03-04 MED ORDER — OXYCODONE HCL 5 MG PO TABS: 5.0000 mg | ORAL_TABLET | ORAL | 0 refills | Status: DC | PRN

## 2018-03-04 NOTE — Discharge Instructions (Addendum)
°Your surgeon has performed an operation on your lumbar spine (low back) to relieve pressure on one or more nerves. Many times, patients feel better immediately after surgery and can “overdo it.” Even if you feel well, it is important that you follow these activity guidelines. If you do not let your back heal properly from the surgery, you can increase the chance of a disc herniation and/or return of your symptoms. The following are instructions to help in your recovery once you have been discharged from the hospital. ° °* Do not take anti-inflammatory medications for 3 days after surgery (naproxen [Aleve], ibuprofen [Advil, Motrin], celecoxib [Celebrex], etc.) ° °Activity  °  °No bending, lifting, or twisting (“BLT”). Avoid lifting objects heavier than 10 pounds (gallon milk jug).  Where possible, avoid household activities that involve lifting, bending, pushing, or pulling such as laundry, vacuuming, grocery shopping, and childcare. Try to arrange for help from friends and family for these activities while your back heals. ° °Increase physical activity slowly as tolerated.  Taking short walks is encouraged, but avoid strenuous exercise. Do not jog, run, bicycle, lift weights, or participate in any other exercises unless specifically allowed by your doctor. Avoid prolonged sitting, including car rides. ° °Talk to your doctor before resuming sexual activity. ° °You should not drive until cleared by your doctor. ° °Until released by your doctor, you should not return to work or school.  You should rest at home and let your body heal.  ° °You may shower two days after your surgery.  After showering, lightly dab your incision dry. Do not take a tub bath or go swimming for 3 weeks, or until approved by your doctor at your follow-up appointment. ° °If you smoke, we strongly recommend that you quit.  Smoking has been proven to interfere with normal healing in your back and will dramatically reduce the success rate of  your surgery. Please contact [facility] (800-QUIT-NOW) and use the resources at www.[facility].com for assistance in stopping smoking. ° °Surgical Incision °  °If you have a dressing on your incision, you may remove it three days after your surgery. Keep your incision area clean and dry. ° °If you have staples or stitches on your incision, you should have a follow up scheduled for removal. If you do not have staples or stitches, you will have steri-strips (small pieces of surgical tape) or Dermabond glue. The steri-strips/glue should begin to peel away within about a week (it is fine if the steri-strips fall off before then). If the strips are still in place one week after your surgery, you may gently remove them. ° °Diet          ° ° You may return to your usual diet. Be sure to stay hydrated. ° °When to Contact Us ° °Although your surgery and recovery will likely be uneventful, you may have some residual numbness, aches, and pains in your back and/or legs. This is normal and should improve in the next few weeks. ° °However, should you experience any of the following, contact us immediately: °• New numbness or weakness °• Pain that is progressively getting worse, and is not relieved by your pain medications or rest °• Bleeding, redness, swelling, pain, or drainage from surgical incision °• Chills or flu-like symptoms °• Fever greater than 101.0 F (38.3 C) °• Problems with bowel or bladder functions °• Difficulty breathing or shortness of breath °• Warmth, tenderness, or swelling in your calf ° °Contact Information °• During office hours (Monday-Friday   9 am to 5 pm), please call your physician at [phone] °• After hours and weekends, please call the [facility] Operator at [phone] and ask for the Neurosurgery Resident On Call  °• For a life-threatening emergency, call 911 ° °

## 2018-03-04 NOTE — Progress Notes (Signed)
Patient is being discharged home with wife. IV removed with cath intact. Reviewed discharge instructions including no bending, twisting, arching, and no lifting more than 10 pounds. Allowed time for questions.

## 2018-03-04 NOTE — Anesthesia Postprocedure Evaluation (Signed)
Anesthesia Post Note  Patient: Blake [name]  Procedure(s) Performed: LUMBAR LAMINECTOMY/DECOMPRESSION MICRODISCECTOMY 1 LEVEL-L3-4,L4-5 (N/A Back)  Patient location during evaluation: PACU Anesthesia Type: General Level of consciousness: awake and alert Pain management: pain level controlled Vital Signs Assessment: post-procedure vital signs reviewed and stable Respiratory status: spontaneous breathing, nonlabored ventilation, respiratory function stable and patient connected to nasal cannula oxygen Cardiovascular status: blood pressure returned to baseline and stable Postop Assessment: no apparent nausea or vomiting Anesthetic complications: no     Last Vitals:  Vitals:   03/03/18 2316 03/04/18 0415  BP: (!) 173/87 (!) 127/50  Pulse: 87 85  Resp: 19 19  Temp: 36.7 C 36.7 C  SpO2: 97% 96%    Last Pain:  Vitals:   03/04/18 0415  TempSrc: Oral  PainSc:                  Alphonsus Sias

## 2018-03-04 NOTE — Care Management (Signed)
RN notified RNCM that patient will need a rolling walker prior to discharge.  I have confirmed with patient. He did not have preference of DME agency. Corene Cornea with [facility] notified. PA also notified of order need.

## 2018-03-04 NOTE — Care Management Note (Signed)
Case Management Note  Patient Details  Name: Blake [name] MRN: [id number] Date of Birth: 1946-03-31  Subjective/Objective:                   RNCM met with patient to discuss discharge planning. He has pre-arranged with Surgeon to have home health therapy through [facility]. I have confirmed with Joelene Millin with [facility].  I have provided patient with CMS.gov list of agencies base on 3-5 stars ratings. [facility] was not on the list for his [place]. He is independent at baseline and does not expect to need an ambulatory aide.  His PCP is DR. Georgie Chard.  He has support from him wife.  He uses [facility] for medications.   Action/Plan:   RNCM will follow.   Expected Discharge Date:                  Expected Discharge Plan:     In-House Referral:     Discharge planning Services  CM Consult  Post Acute Care Choice:  Home Health Choice offered to:  Patient  DME Arranged:    DME Agency:     HH Arranged:  PT HH Agency:  [facility]  Status of Service:  In process, will continue to follow  If discussed at Long Length of Stay Meetings, dates discussed:    Additional Comments:  Marshell Garfinkel, RN 03/04/2018, 12:12 PM

## 2018-03-04 NOTE — Discharge Summary (Signed)
Procedure: L3-4, L4-5 lumbar laminectomy Procedure date: 03/03/2018 Diagnosis: Neurogenic claudication  History: POD1:  Update: He has been evaluated by PT. Complains of weakness in lower extremities but he is able to ambulated with walker. Pain that was present prior to surgery seem to be resolved.   Overall he is doing well.  Pain currently rated 5/10 and incision site.  He complains of some bilateral calf soreness that he feels may be related to SCDs.  Unable to determine if symptoms prior to surgery have resolved, since they present while ambulating and he has been on laying flat positional orders.  He has been able to eat and void without any issues. Denies headache.   POD0 Blake [name] is POD 0 status post L3-4 and L4-5 lumbar decompression for neurogenic claudication symptoms.  Evaluated in postop recovery and doing well.  Still disoriented from anesthesia but able to answer question of weakness.  Denies any pain at this time including lower extremity pain, back pain.  Denies any new numbness or tingling lower extremities.  Physical Exam: Vitals:   03/04/18 0415 03/04/18 0800  BP: (!) 127/50 (!) 127/52  Pulse: 85 80  Resp: 19 18  Temp: 98 F (36.7 C) 98.7 F (37.1 C)  SpO2: 96% 95%    AA Ox3 Strength:5/5 throughout lower extremities Sensation: Intact and symmetric throughout lower extremities Skin:Scant blood on dressing. No obvious clear discharge.  Data:  No results for input(s): NA, K, CL, CO2, BUN, CREATININE, LABGLOM, GLUCOSE, CALCIUM in the last 168 hours. No results for input(s): AST, ALT, ALKPHOS in the last 168 hours.  Invalid input(s): TBILI   No results for input(s): WBC, HGB, HCT, PLT in the last 168 hours. No results for input(s): APTT, INR in the last 168 hours.       Other tests/results: No imaging reviewed  Assessment/Plan:  Blake [name] POD 1 status post L3-4 and L4-5 lumbar laminectomies for neurogenic claudication.  Overall he is doing well.  Pain has been adequately controlled with tylenol, robaxin, and oxycodone as needed. No complaints of headache. Scheduled for home health/PT with [facility]. Evaluation by hospital PT recommends home walker. He is scheduled to follow up in clinic in approximately 2 weeks to monitor progress.   Marin Olp PA-C Department of Neurosurgery

## 2018-03-04 NOTE — Evaluation (Signed)
Physical Therapy Evaluation Patient Details Name: Blake [name] MRN: [id number] DOB: 12-18-1945 Today's Date: 03/04/2018   History of Present Illness  Pt is a 72 yo male with lumbar stenosis is status post L3-4 and L4-5 lumbar decompression for neurogenic claudication symptoms.  PMH includes HTN, GERD, DM, and tongue CA.    Clinical Impression  Pt presents with minor deficits in strength, transfers, mobility, gait, and activity tolerance but overall performed well during the session.  Pt reported 4/10 low back pain at baseline and 5/10 low back pain at the end of the session and reported no pain extending down either LE.  Pt c/o BLE weakness during the session but was functionally strong and stated that the weakness improved as the session progressed.  Pt was steady with amb with a RW with mildly tremulous BUEs that improved during the session.  Pt was steady ascending and descending 2 stairs x 2 with one rail and a SPC.  Pt will benefit from HHPT services upon discharge to safely address above deficits for decreased caregiver assistance and eventual return to PLOF.     Follow Up Recommendations Home health PT;Supervision for mobility/OOB    Equipment Recommendations  Rolling walker with 5" wheels;3in1 (PT)    Recommendations for Other Services       Precautions / Restrictions Precautions Precautions: Back Precaution Booklet Issued: No Precaution Comments: No BAT back precautions education and review provided Required Braces or Orthoses: (No brace required per orders) Restrictions Weight Bearing Restrictions: No Other Position/Activity Restrictions: No Bending, Arching, or Twisting      Mobility  Bed Mobility Overal bed mobility: Modified Independent Bed Mobility: Sidelying to Sit;Sit to Sidelying   Sidelying to sit: Supervision     Sit to sidelying: Supervision General bed mobility comments: Mod verbal cues for log roll technique training  Transfers Overall transfer  level: Needs assistance Equipment used: Rolling walker (2 wheeled) Transfers: Sit to/from Stand Sit to Stand: Min guard         General transfer comment: Good control and stability  Ambulation/Gait Ambulation/Gait assistance: Min guard Gait Distance (Feet): 200 Feet Assistive device: Rolling walker (2 wheeled) Gait Pattern/deviations: Step-through pattern;Decreased step length - right;Decreased step length - left Gait velocity: Decreased   General Gait Details: Mildly tremulous BUEs during amb that improved during the session; slow cadence with amb but steady without LOB; min verbal cues for decreased BUE WB on the RW and amb closer to [employer] Stairs: Yes Stairs assistance: Min guard Stair Management: One rail Left;With cane Number of Stairs: 2 General stair comments: Ascend and descend 2 steps x 2 with SPC in RUE and rail with LUE to simulate holding door frame - pt steady with good eccentric and concentric control with min verbal cues for sequencing with the [facility]   Wheelchair Mobility    Modified Rankin (Stroke Patients Only)       Balance Overall balance assessment: No apparent balance deficits (not formally assessed)                                           Pertinent Vitals/Pain Pain Assessment: 0-10 Pain Score: 4  Pain Location: low back Pain Descriptors / Indicators: Aching;Sore Pain Intervention(s): Monitored during session    Home Living Family/patient expects to be discharged to:: Private residence Living Arrangements: Spouse/significant other Available Help at Discharge: Family;Available 24 hours/day Type of  Home: House Home Access: Stairs to enter Entrance Stairs-Rails: None Entrance Stairs-Number of Steps: 2 Home Layout: Two level;Able to live on main level with bedroom/bathroom Home Equipment: Kasandra Knudsen - single point;Walker - 4 wheels      Prior Function Level of Independence: Independent         Comments: Ind amb community  distances without an AD, no fall history, Ind with ADLs     Hand Dominance        Extremity/Trunk Assessment   Upper Extremity Assessment Upper Extremity Assessment: Overall WFL for tasks assessed    Lower Extremity Assessment Lower Extremity Assessment: Generalized weakness       Communication   Communication: No difficulties  Cognition Arousal/Alertness: Awake/alert Behavior During Therapy: WFL for tasks assessed/performed Overall Cognitive Status: Within Functional Limits for tasks assessed                                        General Comments      Exercises Other Exercises Other Exercises: Back precaution education Other Exercises: Log roll technique education and practice   Assessment/Plan    PT Assessment Patient needs continued PT services  PT Problem List Decreased strength;Decreased activity tolerance;Decreased mobility;Decreased knowledge of precautions       PT Treatment Interventions DME instruction;Gait training;Stair training;Functional mobility training;Balance training;Therapeutic activities;Therapeutic exercise;Patient/family education    PT Goals (Current goals can be found in the Care Plan section)  Acute Rehab PT Goals Patient Stated Goal: To get back to working in my yard PT Goal Formulation: With patient Time For Goal Achievement: 03/17/18 Potential to Achieve Goals: Good    Frequency 7X/week   Barriers to discharge        Co-evaluation               AM-PAC PT "6 Clicks" Mobility  Outcome Measure Help needed turning from your back to your side while in a flat bed without using bedrails?: A Little Help needed moving from lying on your back to sitting on the side of a flat bed without using bedrails?: A Little Help needed moving to and from a bed to a chair (including a wheelchair)?: A Little Help needed standing up from a chair using your arms (e.g., wheelchair or bedside chair)?: A Little Help needed to walk  in hospital room?: A Little Help needed climbing 3-5 steps with a railing? : A Little 6 Click Score: 18    End of Session Equipment Utilized During Treatment: Gait belt Activity Tolerance: Patient tolerated treatment well Patient left: in bed;with family/visitor present;with call bell/phone within reach Nurse Communication: Mobility status PT Visit Diagnosis: Muscle weakness (generalized) (M62.81);Difficulty in walking, not elsewhere classified (R26.2)    Time: 5093-2671 PT Time Calculation (min) (ACUTE ONLY): 39 min   Charges:   PT Evaluation $PT Eval Low Complexity: 1 Low PT Treatments $Gait Training: 8-22 mins $Therapeutic Activity: 8-22 mins        D. Scott Ronald Londo PT, DPT 03/04/18, 1:25 PM

## 2018-03-04 NOTE — Care Management Obs Status (Signed)
MEDICARE OBSERVATION STATUS NOTIFICATION   Patient Details  Name: Blake [name] MRN: [id number] Date of Birth: 01/08/1946   Medicare Observation Status Notification Given:  Yes    Marshell Garfinkel, RN 03/04/2018, 12:10 PM

## 2018-03-04 NOTE — Progress Notes (Signed)
Procedure: L3-4, L4-5 lumbar laminectomy Procedure date: 03/03/2018 Diagnosis: Neurogenic claudication  History: POD1: Overall he is doing well.  Pain currently rated 5/10 and incision site.  He complains of some bilateral calf soreness that he feels may be related to SCDs.  Unable to determine if symptoms prior to surgery have resolved, since they present while ambulating and he has been on laying flat positional orders.  He has been able to eat and void without any issues. Denies headache.   POD0 Blake [name] is POD 0 status post L3-4 and L4-5 lumbar decompression for neurogenic claudication symptoms.  Evaluated in postop recovery and doing well.  Still disoriented from anesthesia but able to answer question of weakness.  Denies any pain at this time including lower extremity pain, back pain.  Denies any new numbness or tingling lower extremities.  Physical Exam: Vitals:   03/03/18 2316 03/04/18 0415  BP: (!) 173/87 (!) 127/50  Pulse: 87 85  Resp: 19 19  Temp: 98.1 F (36.7 C) 98 F (36.7 C)  SpO2: 97% 96%    AA Ox3 Strength:5/5 throughout lower extremities Sensation: Intact and symmetric throughout lower extremities Skin:Scant blood on dressing. No obvious clear discharge.  Data:  No results for input(s): NA, K, CL, CO2, BUN, CREATININE, LABGLOM, GLUCOSE, CALCIUM in the last 168 hours. No results for input(s): AST, ALT, ALKPHOS in the last 168 hours.  Invalid input(s): TBILI   No results for input(s): WBC, HGB, HCT, PLT in the last 168 hours. No results for input(s): APTT, INR in the last 168 hours.       Other tests/results: No imaging reviewed  Assessment/Plan:  Blake [name] POD 1 status post L3-4 and L4-5 lumbar laminectomies for neurogenic claudication.  Overall he is doing well.   - PT -to determine ambulation abilities and resolution of neurogenic claudication symptoms. - mobilize - pain control - DVT prophylaxis  Marin Olp PA-C Department of  Neurosurgery

## 2018-03-04 NOTE — Addendum Note (Signed)
Addendum  created 03/04/18 1027 by Doreen Salvage, CRNA   Charge Capture section accepted

## 2018-10-19 ENCOUNTER — Ambulatory Visit (INDEPENDENT_AMBULATORY_CARE_PROVIDER_SITE_OTHER): Payer: Medicare Other | Admitting: Vascular Surgery

## 2018-10-19 ENCOUNTER — Other Ambulatory Visit: Payer: Self-pay

## 2018-10-19 ENCOUNTER — Encounter (INDEPENDENT_AMBULATORY_CARE_PROVIDER_SITE_OTHER): Payer: Self-pay | Admitting: Vascular Surgery

## 2018-10-19 ENCOUNTER — Ambulatory Visit (INDEPENDENT_AMBULATORY_CARE_PROVIDER_SITE_OTHER): Payer: Medicare Other

## 2018-10-19 VITALS — BP 165/76 | HR 80 | Resp 16 | Wt 185.8 lb

## 2018-10-19 DIAGNOSIS — E785 Hyperlipidemia, unspecified: Secondary | ICD-10-CM | POA: Diagnosis not present

## 2018-10-19 DIAGNOSIS — E119 Type 2 diabetes mellitus without complications: Secondary | ICD-10-CM

## 2018-10-19 DIAGNOSIS — I6523 Occlusion and stenosis of bilateral carotid arteries: Secondary | ICD-10-CM | POA: Diagnosis not present

## 2018-10-19 DIAGNOSIS — Z87891 Personal history of nicotine dependence: Secondary | ICD-10-CM

## 2018-10-19 DIAGNOSIS — I10 Essential (primary) hypertension: Secondary | ICD-10-CM

## 2018-10-19 DIAGNOSIS — Z79899 Other long term (current) drug therapy: Secondary | ICD-10-CM

## 2018-10-19 NOTE — Assessment & Plan Note (Signed)
blood glucose control important in reducing the progression of atherosclerotic disease. Also, involved in wound healing. On appropriate medications.  

## 2018-10-19 NOTE — Assessment & Plan Note (Signed)
The patient's carotid duplex today demonstrates stable 1 to 39% right ICA stenosis but progression of his disease on the left now into the 60 to 79% range.  He remains asymptomatic.  We discussed full-strength aspirin versus the addition of Plavix and for now we will continue on the aspirin.  He will continue his high-dose statin therapy.  We will shorten his follow-up to 6 months and I will see him again at that time.  He will contact our office with any problems in the interim.

## 2018-10-19 NOTE — Progress Notes (Signed)
MRN : [id number]  Blake [name] is a 73 y.o. ([birth year]) male who presents with chief complaint of  Chief Complaint  Patient presents with  . Follow-up    ultrasound follow up  .  History of Present Illness: Patient returns in follow-up of his carotid disease.  He is doing well today and denies any focal neurologic symptoms. Specifically, the patient denies amaurosis fugax, speech or swallowing difficulties, or arm or leg weakness or numbness. The patient's carotid duplex today demonstrates stable 1 to 39% right ICA stenosis but progression of his disease on the left now into the 60 to 79% range  Current Outpatient Medications  Medication Sig Dispense Refill  . atorvastatin (LIPITOR) 80 MG tablet Take 80 mg by mouth daily.   2  . Blood Glucose Monitoring Suppl (ONE TOUCH ULTRA SYSTEM KIT) w/Device KIT     . lisinopril (PRINIVIL,ZESTRIL) 10 MG tablet Take 10 mg by mouth 2 (two) times daily.     . methocarbamol (ROBAXIN) 500 MG tablet Take 1 tablet (500 mg total) by mouth every 6 (six) hours as needed for muscle spasms. 90 tablet 0  . metoprolol succinate (TOPROL-XL) 50 MG 24 hr tablet Take 50 mg by mouth daily.     [name] Kitchen omeprazole (PRILOSEC) 40 MG capsule Take 40 mg by mouth daily as needed (heartburn).   3  . tamsulosin (FLOMAX) 0.4 MG CAPS capsule Take 0.4 mg by mouth daily after supper.     [name] Kitchen oxyCODONE (OXY IR/ROXICODONE) 5 MG immediate release tablet Take 1 tablet (5 mg total) by mouth every 4 (four) hours as needed for moderate pain ((score 4 to 6)). (Patient not taking: Reported on 10/19/2018) 30 tablet 0  . polyethylene glycol powder (GLYCOLAX/MIRALAX) powder 255 grams one bottle for colonoscopy prep (Patient not taking: Reported on 02/12/2018) 255 g 0  . ranitidine (ZANTAC) 300 MG tablet Take by mouth.     No current facility-administered medications for this visit.     Past Medical History:  Diagnosis Date  . Back pain    with leg pain  . 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 Dyspnea    . GERD (gastroesophageal reflux disease)   . Hyperlipidemia   . Hypertension   . Numbness and tingling of both lower extremities    with positioning  . Prostate enlargement     Past Surgical History:  Procedure Laterality Date  . COLONOSCOPY  12/08/2003  . LUMBAR LAMINECTOMY/DECOMPRESSION MICRODISCECTOMY N/A 03/03/2018   Procedure: LUMBAR LAMINECTOMY/DECOMPRESSION MICRODISCECTOMY 1 LEVEL-L3-4,L4-5;  Surgeon: Meade Maw, MD;  Location: [facility] ORS;  Service: Neurosurgery;  Laterality: N/A;  . PARTIAL HIP ARTHROPLASTY  2014  . PORT-A-CATH REMOVAL    . TONGUE BIOPSY  2006  . TONSILLECTOMY    . UPPER GI ENDOSCOPY  01/25/14   multiple gastric polyps    Social History Social History   Tobacco Use  . Smoking status: Former Smoker    Packs/day: 1.00    Years: 30.00    Pack years: 30.00    Types: Cigarettes    Quit date: 02/18/1994    Years since quitting: 24.6  . Smokeless tobacco: Never Used  Substance Use Topics  . Alcohol use: Yes  . Drug use: No    Family History Family History  Problem Relation Age of Onset  . Diabetes Mother   . Lung cancer Father      Allergies  Allergen Reactions  . Sulfa Antibiotics Rash     REVIEW OF SYSTEMS (  Negative unless checked)  Constitutional: _0 Weight loss  _1 Fever  _2 Chills Cardiac: _3 Chest pain   _4 Chest pressure   _5 Palpitations   _6 Shortness of breath when laying flat   _7 Shortness of breath at rest   _8 Shortness of breath with exertion. Vascular:  _9 Pain in legs with walking   _10 Pain in legs at rest   _11 Pain in legs when laying flat   _12 Claudication   _13 Pain in feet when walking  _14 Pain in feet at rest  _15 Pain in feet when laying flat   _16 History of DVT   _17 Phlebitis   _18 Swelling in legs   _19 Varicose veins   _20 Non-healing ulcers Pulmonary:   _21 Uses home oxygen   _22 Productive cough   _23 Hemoptysis   _24 Wheeze  _25 COPD   _26 Asthma Neurologic:  _27 Dizziness  _28 Blackouts   _29 Seizures   _30 History of stroke   _31 History of TIA   _32 Aphasia   _33 Temporary blindness   _34 Dysphagia   _35 Weakness or numbness in arms   _36 Weakness or numbness in legs Musculoskeletal:  _37 Arthritis   _38 Joint swelling   _39 Joint pain   _40 Low back pain Hematologic:  _41 Easy bruising  _42 Easy bleeding   _43 Hypercoagulable state   _44 Anemic  _45 Hepatitis Gastrointestinal:  _46 Blood in stool   _47 Vomiting blood  _48 Gastroesophageal reflux/heartburn   _49 Difficulty swallowing. Genitourinary:  _50 Chronic kidney disease   _51 Difficult urination  _52 Frequent urination  _53 Burning with urination   _54 Blood in urine Skin:  _55 Rashes   _56 Ulcers   _57 Wounds Psychological:  _58 History of anxiety   _59  History of major depression.  Physical Examination  Vitals:   10/19/18 1107  BP: (!) 165/76  Pulse: 80  Resp: 16  Weight: 185 lb 12.8 oz (84.3 kg)   Body mass index is 30.92 kg/m. Gen:  WD/WN, NAD Head: [place]/AT, No temporalis wasting. Ear/Nose/Throat: Hearing grossly intact, nares w/o erythema or drainage, trachea midline Eyes: Conjunctiva clear. Sclera non-icteric Neck: Supple.  Left carotid bruit  Pulmonary:  Good air movement, equal and clear to auscultation bilaterally.  Cardiac: RRR, No JVD Vascular:  Vessel Right Left  Radial Palpable Palpable               Musculoskeletal: M/S 5/5 throughout.  No deformity or atrophy. No edema. Neurologic: CN 2-12 intact. Sensation grossly intact in extremities.  Symmetrical.  Speech is fluent. Motor exam as listed above. Psychiatric: Judgment intact, Mood & affect appropriate for pt's clinical situation. Dermatologic: No rashes or ulcers noted.  No cellulitis or open wounds.      CBC Lab Results  Component Value Date   WBC 6.2 02/18/2018   HGB 15.5 02/18/2018   HCT 48.1 02/18/2018   MCV 92.1 02/18/2018   PLT 215 02/18/2018    BMET    Component Value Date/Time   NA 137 02/18/2018 1326   NA 130 (L) 01/26/2013 0505   K 4.3 02/18/2018 1326   K 4.6 01/26/2013 0505   CL 103 02/18/2018 1326   CL 101 01/26/2013  0505   CO2 29 02/18/2018 1326   CO2 26 01/26/2013 0505   GLUCOSE 209 (H) 02/18/2018 1326   GLUCOSE 138 (H) 01/26/2013 0505   BUN 23 02/18/2018 1326   BUN 19 (H) 01/26/2013 0505   CREATININE 1.11 02/18/2018 1326   CREATININE 1.18 01/26/2013 0505   CALCIUM 8.7 (L) 02/18/2018 1326   CALCIUM 8.3 (L) 01/26/2013 0505   GFRNONAA >60 02/18/2018 1326   GFRNONAA >60 01/26/2013 0505   GFRAA >60 02/18/2018 1326   GFRAA >60 01/26/2013 0505   CrCl cannot  be calculated (Patient's most recent lab result is older than the maximum 21 days allowed.).  COAG Lab Results  Component Value Date   INR 0.89 02/18/2018   INR 0.9 01/12/2013    Radiology No results found.    Assessment/Plan HLD (hyperlipidemia) lipid control important in reducing the progression of atherosclerotic disease. Continue statin therapy   Essential (primary) hypertension blood pressure control important in reducing the progression of atherosclerotic disease. On appropriate oral medications.  Diabetes mellitus ([place]) blood glucose control important in reducing the progression of atherosclerotic disease. Also, involved in wound healing. On appropriate medications.   Carotid stenosis The patient's carotid duplex today demonstrates stable 1 to 39% right ICA stenosis but progression of his disease on the left now into the 60 to 79% range.  He remains asymptomatic.  We discussed full-strength aspirin versus the addition of Plavix and for now we will continue on the aspirin.  He will continue his high-dose statin therapy.  We will shorten his follow-up to 6 months and I will see him again at that time.  He will contact our office with any problems in the interim.    Leotis Pain, MD  10/19/2018 12:08 PM    This note was created with Dragon medical transcription system.  Any errors from dictation are purely unintentional

## 2018-12-16 DIAGNOSIS — R200 Anesthesia of skin: Secondary | ICD-10-CM | POA: Insufficient documentation

## 2018-12-16 DIAGNOSIS — M79642 Pain in left hand: Secondary | ICD-10-CM | POA: Insufficient documentation

## 2019-03-17 ENCOUNTER — Other Ambulatory Visit
Admission: RE | Admit: 2019-03-17 | Discharge: 2019-03-17 | Disposition: A | Discharge status: Home / Self Care | Payer: Medicare Other | Source: Ambulatory Visit | Attending: Internal Medicine | Admitting: Internal Medicine

## 2019-03-17 ENCOUNTER — Other Ambulatory Visit: Payer: Self-pay

## 2019-03-17 DIAGNOSIS — Z01812 Encounter for preprocedural laboratory examination: Secondary | ICD-10-CM | POA: Diagnosis present

## 2019-03-17 DIAGNOSIS — Z20828 Contact with and (suspected) exposure to other viral communicable diseases: Secondary | ICD-10-CM | POA: Insufficient documentation

## 2019-03-17 LAB — SARS CORONAVIRUS 2 (TAT 6-24 HRS): SARS Coronavirus 2: NEGATIVE

## 2019-03-18 ENCOUNTER — Encounter: Payer: Self-pay | Admitting: Internal Medicine

## 2019-03-21 ENCOUNTER — Encounter: Payer: Self-pay | Admitting: Internal Medicine

## 2019-03-21 ENCOUNTER — Ambulatory Visit: Payer: Medicare Other | Admitting: Certified Registered"

## 2019-03-21 ENCOUNTER — Encounter
Admission: RE | Disposition: A | Discharge status: Home / Self Care | Payer: Self-pay | Source: Home / Self Care | Attending: Internal Medicine

## 2019-03-21 ENCOUNTER — Ambulatory Visit
Admission: RE | Admit: 2019-03-21 | Discharge: 2019-03-21 | Disposition: A | Discharge status: Home / Self Care | Payer: Medicare Other | Attending: Internal Medicine | Admitting: Internal Medicine

## 2019-03-21 ENCOUNTER — Other Ambulatory Visit: Payer: Self-pay

## 2019-03-21 DIAGNOSIS — Z86010 Personal history of colonic polyps: Secondary | ICD-10-CM | POA: Insufficient documentation

## 2019-03-21 DIAGNOSIS — K449 Diaphragmatic hernia without obstruction or gangrene: Secondary | ICD-10-CM | POA: Insufficient documentation

## 2019-03-21 DIAGNOSIS — K5730 Diverticulosis of large intestine without perforation or abscess without bleeding: Secondary | ICD-10-CM | POA: Insufficient documentation

## 2019-03-21 DIAGNOSIS — I10 Essential (primary) hypertension: Secondary | ICD-10-CM | POA: Insufficient documentation

## 2019-03-21 DIAGNOSIS — Z955 Presence of coronary angioplasty implant and graft: Secondary | ICD-10-CM | POA: Insufficient documentation

## 2019-03-21 DIAGNOSIS — D122 Benign neoplasm of ascending colon: Secondary | ICD-10-CM | POA: Insufficient documentation

## 2019-03-21 DIAGNOSIS — M1990 Unspecified osteoarthritis, unspecified site: Secondary | ICD-10-CM | POA: Insufficient documentation

## 2019-03-21 DIAGNOSIS — I2510 Atherosclerotic heart disease of native coronary artery without angina pectoris: Secondary | ICD-10-CM | POA: Insufficient documentation

## 2019-03-21 DIAGNOSIS — Z85810 Personal history of malignant neoplasm of tongue: Secondary | ICD-10-CM | POA: Insufficient documentation

## 2019-03-21 DIAGNOSIS — K219 Gastro-esophageal reflux disease without esophagitis: Secondary | ICD-10-CM | POA: Diagnosis not present

## 2019-03-21 DIAGNOSIS — Z79899 Other long term (current) drug therapy: Secondary | ICD-10-CM | POA: Diagnosis not present

## 2019-03-21 DIAGNOSIS — I6529 Occlusion and stenosis of unspecified carotid artery: Secondary | ICD-10-CM | POA: Insufficient documentation

## 2019-03-21 DIAGNOSIS — E785 Hyperlipidemia, unspecified: Secondary | ICD-10-CM | POA: Insufficient documentation

## 2019-03-21 DIAGNOSIS — E119 Type 2 diabetes mellitus without complications: Secondary | ICD-10-CM | POA: Diagnosis not present

## 2019-03-21 DIAGNOSIS — Z1289 Encounter for screening for malignant neoplasm of other sites: Secondary | ICD-10-CM | POA: Insufficient documentation

## 2019-03-21 DIAGNOSIS — C155 Malignant neoplasm of lower third of esophagus: Secondary | ICD-10-CM | POA: Diagnosis not present

## 2019-03-21 DIAGNOSIS — N400 Enlarged prostate without lower urinary tract symptoms: Secondary | ICD-10-CM | POA: Insufficient documentation

## 2019-03-21 DIAGNOSIS — Z7984 Long term (current) use of oral hypoglycemic drugs: Secondary | ICD-10-CM | POA: Insufficient documentation

## 2019-03-21 DIAGNOSIS — Z882 Allergy status to sulfonamides status: Secondary | ICD-10-CM | POA: Diagnosis not present

## 2019-03-21 DIAGNOSIS — Z1211 Encounter for screening for malignant neoplasm of colon: Secondary | ICD-10-CM | POA: Diagnosis present

## 2019-03-21 DIAGNOSIS — Z7982 Long term (current) use of aspirin: Secondary | ICD-10-CM | POA: Insufficient documentation

## 2019-03-21 DIAGNOSIS — Z791 Long term (current) use of non-steroidal anti-inflammatories (NSAID): Secondary | ICD-10-CM | POA: Diagnosis not present

## 2019-03-21 DIAGNOSIS — K2970 Gastritis, unspecified, without bleeding: Secondary | ICD-10-CM | POA: Insufficient documentation

## 2019-03-21 HISTORY — PX: ESOPHAGOGASTRODUODENOSCOPY (EGD) WITH PROPOFOL: SHX5813

## 2019-03-21 HISTORY — DX: Occlusion and stenosis of bilateral carotid arteries: I65.23

## 2019-03-21 HISTORY — DX: Unspecified osteoarthritis, unspecified site: M19.90

## 2019-03-21 HISTORY — PX: COLONOSCOPY WITH PROPOFOL: SHX5780

## 2019-03-21 HISTORY — DX: Atherosclerotic heart disease of native coronary artery without angina pectoris: I25.10

## 2019-03-21 LAB — GLUCOSE, CAPILLARY: Glucose-Capillary: 151 mg/dL — ABNORMAL HIGH (ref 70–99)

## 2019-03-21 SURGERY — COLONOSCOPY WITH PROPOFOL
Anesthesia: General

## 2019-03-21 MED ORDER — ESMOLOL HCL 100 MG/10ML IV SOLN
INTRAVENOUS | Status: DC | PRN
  Administered 2019-03-21 (×2): 10 mg via INTRAVENOUS

## 2019-03-21 MED ORDER — SODIUM CHLORIDE 0.9 % IV SOLN: INTRAVENOUS | Status: DC

## 2019-03-21 MED ORDER — PROPOFOL 500 MG/50ML IV EMUL
INTRAVENOUS | Status: DC | PRN
  Administered 2019-03-21: 160 ug/kg/min via INTRAVENOUS

## 2019-03-21 MED ORDER — PROPOFOL 10 MG/ML IV BOLUS
INTRAVENOUS | Status: DC | PRN
  Administered 2019-03-21: 10 mg via INTRAVENOUS
  Administered 2019-03-21: 100 mg via INTRAVENOUS

## 2019-03-21 MED ORDER — ESMOLOL HCL 100 MG/10ML IV SOLN: INTRAVENOUS | Status: DC | PRN

## 2019-03-21 MED ORDER — PHENYLEPHRINE HCL (PRESSORS) 10 MG/ML IV SOLN
INTRAVENOUS | Status: DC | PRN
  Administered 2019-03-21 (×2): 100 ug via INTRAVENOUS

## 2019-03-21 MED ORDER — LIDOCAINE HCL (CARDIAC) PF 100 MG/5ML IV SOSY
PREFILLED_SYRINGE | INTRAVENOUS | Status: DC | PRN
  Administered 2019-03-21: 100 mg via INTRATRACHEAL

## 2019-03-21 NOTE — Anesthesia Procedure Notes (Signed)
Date/Time: 03/21/2019 12:22 PM Performed by: Nelda Marseille, CRNA Pre-anesthesia Checklist: Patient identified, Emergency Drugs available, Suction available, Patient being monitored and Timeout performed Oxygen Delivery Method: Nasal cannula

## 2019-03-21 NOTE — Op Note (Addendum)
[facility] Patient Name: Blake [name] Procedure Date: 03/21/2019 11:57 AM MRN: [id number] Account #: [id number] Date of Birth: 01/18/1946 Admit Type: Outpatient Age: 73 Room: [facility] ENDO ROOM 1 Gender: Male Note Status: Finalized Procedure:             Upper GI endoscopy Indications:           Surveillance for malignancy due to personal history of                         Barrett's esophagus Providers:             Benay Pike. Alice Reichert MD, MD Referring MD:          Leonie Douglas. Doy Hutching, MD (Referring MD) Medicines:             Propofol per Anesthesia Complications:         No immediate complications. Procedure:             Pre-Anesthesia Assessment:                        - The risks and benefits of the procedure and the                         sedation options and risks were discussed with the                         patient. All questions were answered and informed                         consent was obtained.                        - Patient identification and proposed procedure were                         verified prior to the procedure by the nurse. The                         procedure was verified in the procedure room.                        - ASA Grade Assessment: III - A patient with severe                         systemic disease.                        - After reviewing the risks and benefits, the patient                         was deemed in satisfactory condition to undergo the                         procedure.                        After obtaining informed consent, the endoscope was                         passed under direct vision.  Throughout the procedure,                         the patient's blood pressure, pulse, and oxygen                         saturations were monitored continuously. The Endoscope                         was introduced through the mouth, and advanced to the                         third part of duodenum. The  upper GI endoscopy was                         accomplished without difficulty. The patient tolerated                         the procedure well. Findings:      There were esophageal mucosal changes secondary to established       long-segment Barrett's disease present in the lower third of the       esophagus. The maximum longitudinal extent of these mucosal changes was       7 cm in length. Mucosa was biopsied with a cold forceps for histology in       4 quadrants at intervals of 2 cm in the lower third of the esophagus. A       total of 4 specimen bottles were sent to pathology.      A 2 cm hiatal hernia was present.      Localized mild inflammation characterized by erythema was found in the       gastric body.      The examined duodenum was normal. Impression:            - Esophageal mucosal changes secondary to established                         long-segment Barrett's disease. Biopsied.                        - 2 cm hiatal hernia.                        - Gastritis.                        - Normal examined duodenum. Recommendation:        - Await pathology results.                        - Proceed with colonoscopy Procedure Code(s):     --- Professional ---                        [phone], Esophagogastroduodenoscopy, flexible,                         transoral; with biopsy, single or multiple Diagnosis Code(s):     --- Professional ---                        K29.70, Gastritis,  unspecified, without bleeding                        K44.9, Diaphragmatic hernia without obstruction or                         gangrene                        K22.70, Barrett's esophagus without dysplasia CPT copyright 2019 American Medical Association. All rights reserved. The codes documented in this report are preliminary and upon coder review may  be revised to meet current compliance requirements. Efrain Sella MD, MD 03/21/2019 12:27:18 PM This report has been signed electronically. Number of  Addenda: 0 Note Initiated On: 03/21/2019 11:57 AM Estimated Blood Loss:  Estimated blood loss was minimal.      [facility]

## 2019-03-21 NOTE — Anesthesia Preprocedure Evaluation (Signed)
Anesthesia Evaluation  Patient identified by MRN, date of birth, ID band Patient awake    Reviewed: Allergy & Precautions, H&P , NPO status , reviewed documented beta blocker date and time   Airway Mallampati: II  TM Distance: >3 FB Neck ROM: limited    Dental  (+) Caps   Pulmonary neg pulmonary ROS, neg shortness of breath, neg COPD, neg recent URI, former smoker,    Pulmonary exam normal        Cardiovascular hypertension, (-) angina+ CAD and + Cardiac Stents  (-) Past MI and (-) CABG Normal cardiovascular exam(-) dysrhythmias (-) Valvular Problems/Murmurs     Neuro/Psych neg Seizures  Neuromuscular disease    GI/Hepatic Neg liver ROS, GERD  Controlled,  Endo/Other  diabetes, Well Controlled, Oral Hypoglycemic Agents  Renal/GU negative Renal ROS     Musculoskeletal  (+) Arthritis ,   Abdominal   Peds  Hematology   Anesthesia Other Findings Past Medical History: No date: Back pain     Comment:  with leg pain No date: Cancer ([place])     Comment:  tongue No date: Dyspnea No date: GERD (gastroesophageal reflux disease) No date: Hyperlipidemia No date: Hypertension No date: Numbness and tingling of both lower extremities     Comment:  with positioning No date: Prostate enlargement Past Surgical History: 12/08/2003: COLONOSCOPY 2014: PARTIAL HIP ARTHROPLASTY No date: PORT-A-CATH REMOVAL 2006: TONGUE BIOPSY No date: TONSILLECTOMY 01/25/14: UPPER GI ENDOSCOPY     Comment:  multiple gastric polyps   Reproductive/Obstetrics                             Anesthesia Physical  Anesthesia Plan  ASA: III  Anesthesia Plan: General   Post-op Pain Management:    Induction: Intravenous  PONV Risk Score and Plan: 3 and Propofol infusion and TIVA  Airway Management Planned: Natural Airway and Nasal Cannula  Additional Equipment:   Intra-op Plan:   Post-operative Plan:   Informed  Consent: I have reviewed the patients History and Physical, chart, labs and discussed the procedure including the risks, benefits and alternatives for the proposed anesthesia with the patient or authorized representative who has indicated his/her understanding and acceptance.     Dental Advisory Given  Plan Discussed with: CRNA  Anesthesia Plan Comments:         Anesthesia Quick Evaluation

## 2019-03-21 NOTE — H&P (Signed)
Outpatient short stay form Pre-procedure 03/21/2019 11:00 AM Blake [name] K. Alice Reichert, M.D.  Primary Physician: Fulton Reek, M.D.  Reason for visit:  GERD, hx of Barrett's esophagus without dysplasia (01/24/2014 EGD), hx of adenomatous colon polyps.  History of present illness:  Patient has stable GERD symptoms.  Patient denies any odynophagia dysphagia, anorexia, nausea or vomiting symptoms.  No abdominal pain presently.  Continues omeprazole 40 mg daily.                           Patient presents for colonoscopy for a personal hx of colon polyps. The patient denies abdominal pain, abnormal weight loss or rectal bleeding.    No current facility-administered medications for this encounter.  Medications Prior to Admission  Medication Sig Dispense Refill Last Dose  . aspirin EC 325 MG tablet Take 325 mg by mouth daily.     [name] Kitchen glimepiride (AMARYL) 4 MG tablet Take 4 mg by mouth daily with breakfast.     . ibuprofen (ADVIL) 800 MG tablet Take 800 mg by mouth every 8 (eight) hours as needed.     . meloxicam (MOBIC) 15 MG tablet Take 15 mg by mouth daily.     [name] Kitchen atorvastatin (LIPITOR) 80 MG tablet Take 80 mg by mouth daily.   2   . Blood Glucose Monitoring Suppl (ONE TOUCH ULTRA SYSTEM KIT) w/Device KIT      . lisinopril (PRINIVIL,ZESTRIL) 10 MG tablet Take 10 mg by mouth 2 (two) times daily.      . methocarbamol (ROBAXIN) 500 MG tablet Take 1 tablet (500 mg total) by mouth every 6 (six) hours as needed for muscle spasms. 90 tablet 0   . metoprolol succinate (TOPROL-XL) 50 MG 24 hr tablet Take 50 mg by mouth daily.      [name] Kitchen omeprazole (PRILOSEC) 40 MG capsule Take 40 mg by mouth daily as needed (heartburn).   3   . oxyCODONE (OXY IR/ROXICODONE) 5 MG immediate release tablet Take 1 tablet (5 mg total) by mouth every 4 (four) hours as needed for moderate pain ((score 4 to 6)). (Patient not taking: Reported on 10/19/2018) 30 tablet 0   . polyethylene glycol powder (GLYCOLAX/MIRALAX) powder 255 grams one  bottle for colonoscopy prep (Patient not taking: Reported on 02/12/2018) 255 g 0   . ranitidine (ZANTAC) 300 MG tablet Take by mouth.     . tamsulosin (FLOMAX) 0.4 MG CAPS capsule Take 0.4 mg by mouth daily after supper.         Allergies  Allergen Reactions  . Sulfa Antibiotics Rash     Past Medical History:  Diagnosis Date  . Arthritis   . Back pain    with leg pain  . 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 Coronary artery disease   . Dyspnea   . GERD (gastroesophageal reflux disease)   . Hyperlipidemia   . Hypertension   . Numbness and tingling of both lower extremities    with positioning  . Prostate enlargement     Review of systems:  Otherwise negative.    Physical Exam  Gen: Alert, oriented. Appears stated age.  HEENT: [place]/AT. PERRLA. Lungs: CTA, no wheezes. CV: RR nl S1, S2. Abd: soft, benign, no masses. BS+ Ext: No edema. Pulses 2+    Planned procedures: Proceed with EGD and colonoscopy. The patient understands the nature of the planned procedure, indications, risks, alternatives and potential  complications including but not limited to bleeding, infection, perforation, damage to internal organs and possible oversedation/side effects from anesthesia. The patient agrees and gives consent to proceed.  Please refer to procedure notes for findings, recommendations and patient disposition/instructions.     Dolan Xia K. Alice Reichert, M.D. Gastroenterology 03/21/2019  11:00 AM

## 2019-03-21 NOTE — Op Note (Signed)
[facility] Gastroenterology Patient Name: Blake [name] Procedure Date: 03/21/2019 11:56 AM MRN: [id number] Account #: [id number] Date of Birth: [birth year] Admit Type: Outpatient Age: 73 Room: [facility] ENDO ROOM 1 Gender: Male Note Status: Finalized Procedure:             Colonoscopy Indications:           Surveillance: Personal history of adenomatous polyps                         on last colonoscopy 5 years ago Providers:             Lorie Apley K. Hatsue Sime MD, MD Medicines:             Propofol per Anesthesia Complications:         No immediate complications. Procedure:             Pre-Anesthesia Assessment:                        - The risks and benefits of the procedure and the                         sedation options and risks were discussed with the                         patient. All questions were answered and informed                         consent was obtained.                        - Patient identification and proposed procedure were                         verified prior to the procedure by the nurse. The                         procedure was verified in the procedure room.                        - ASA Grade Assessment: III - A patient with severe                         systemic disease.                        - After reviewing the risks and benefits, the patient                         was deemed in satisfactory condition to undergo the                         procedure.                        After obtaining informed consent, the colonoscope was                         passed under direct vision. Throughout the procedure,  the patient's blood pressure, pulse, and oxygen                         saturations were monitored continuously. The                         Colonoscope was introduced through the anus and                         advanced to the the cecum, identified by appendiceal                         orifice and ileocecal  valve. The colonoscopy was                         performed without difficulty. The patient tolerated                         the procedure well. The quality of the bowel                         preparation was adequate. The ileocecal valve,                         appendiceal orifice, and rectum were photographed. Findings:      The perianal and digital rectal examinations were normal. Pertinent       negatives include normal sphincter tone and no palpable rectal lesions.      Many medium-mouthed diverticula were found in the sigmoid colon.      A 4 mm polyp was found in the ascending colon. The polyp was sessile.       The polyp was removed with a jumbo cold forceps. Resection and retrieval       were complete.      The exam was otherwise without abnormality. Impression:            - Diverticulosis in the sigmoid colon.                        - One 4 mm polyp in the ascending colon, removed with                         a jumbo cold forceps. Resected and retrieved.                        - The examination was otherwise normal. Recommendation:        - Patient has a contact number available for                         emergencies. The signs and symptoms of potential                         delayed complications were discussed with the patient.                         Return to normal activities tomorrow. Written                         discharge instructions  were provided to the patient.                        - Resume previous diet.                        - Continue present medications.                        - Await pathology results.                        - Repeat colonoscopy in 5 years for surveillance.                        - Return to GI office PRN. Procedure Code(s):     --- Professional ---                        [id number], Colonoscopy, flexible; with biopsy, single or                         multiple Diagnosis Code(s):     --- Professional ---                        K57.30,  Diverticulosis of large intestine without                         perforation or abscess without bleeding                        K63.5, Polyp of colon                        Z86.010, Personal history of colonic polyps CPT copyright 2019 [employer]. All rights reserved. The codes documented in this report are preliminary and upon coder review may  be revised to meet current compliance requirements. Efrain Sella MD, MD 03/21/2019 12:41:10 PM This report has been signed electronically. Number of Addenda: 0 Note Initiated On: 03/21/2019 11:56 AM Scope Withdrawal Time: 0 hours 6 minutes 18 seconds  Total Procedure Duration: 0 hours 8 minutes 6 seconds  Estimated Blood Loss:  Estimated blood loss: none.      [facility]

## 2019-03-21 NOTE — Interval H&P Note (Signed)
History and Physical Interval Note:  03/21/2019 11:18 AM  Blake [name]  has presented today for surgery, with the diagnosis of PER HX. OF COLON POLYPS GERD.  The various methods of treatment have been discussed with the patient and family. After consideration of risks, benefits and other options for treatment, the patient has consented to  Procedure(s): COLONOSCOPY WITH PROPOFOL (N/A) ESOPHAGOGASTRODUODENOSCOPY (EGD) WITH PROPOFOL (N/A) as a surgical intervention.  The patient's history has been reviewed, patient examined, no change in status, stable for surgery.  I have reviewed the patient's chart and labs.  Questions were answered to the patient's satisfaction.     [place], [place]

## 2019-03-21 NOTE — Anesthesia Post-op Follow-up Note (Signed)
Anesthesia QCDR form completed.        

## 2019-03-21 NOTE — Interval H&P Note (Signed)
History and Physical Interval Note:  03/21/2019 11:01 AM  Blake [name]  has presented today for surgery, with the diagnosis of PER HX. OF COLON POLYPS GERD.  The various methods of treatment have been discussed with the patient and family. After consideration of risks, benefits and other options for treatment, the patient has consented to  Procedure(s): COLONOSCOPY WITH PROPOFOL (N/A) ESOPHAGOGASTRODUODENOSCOPY (EGD) WITH PROPOFOL (N/A) as a surgical intervention.  The patient's history has been reviewed, patient examined, no change in status, stable for surgery.  I have reviewed the patient's chart and labs.  Questions were answered to the patient's satisfaction.     [place], [place]

## 2019-03-21 NOTE — Transfer of Care (Signed)
Immediate Anesthesia Transfer of Care Note  Patient: Blake [name]  Procedure(s) Performed: COLONOSCOPY WITH PROPOFOL (N/A ) ESOPHAGOGASTRODUODENOSCOPY (EGD) WITH PROPOFOL (N/A )  Patient Location: Endoscopy Unit  Anesthesia Type:General  Level of Consciousness: drowsy, patient cooperative and responds to stimulation  Airway & Oxygen Therapy: Patient Spontanous Breathing and Patient connected to face mask oxygen  Post-op Assessment: Report given to RN and Post -op Vital signs reviewed and stable  Post vital signs: Reviewed and stable  Last Vitals:  Vitals Value Taken Time  BP 124/81 03/21/19 1241  Temp    Pulse 107 03/21/19 1242  Resp 17 03/21/19 1242  SpO2 95 % 03/21/19 1242  Vitals shown include unvalidated device data.  Last Pain:  Vitals:   03/21/19 1111  TempSrc: Temporal  PainSc: 0-No pain         Complications: No apparent anesthesia complications

## 2019-03-22 ENCOUNTER — Encounter: Payer: Self-pay | Admitting: *Deleted

## 2019-03-22 NOTE — Anesthesia Postprocedure Evaluation (Signed)
Anesthesia Post Note  Patient: Blake [name]  Procedure(s) Performed: COLONOSCOPY WITH PROPOFOL (N/A ) ESOPHAGOGASTRODUODENOSCOPY (EGD) WITH PROPOFOL (N/A )  Patient location during evaluation: Endoscopy Anesthesia Type: General Level of consciousness: awake and alert Pain management: pain level controlled Vital Signs Assessment: post-procedure vital signs reviewed and stable Respiratory status: spontaneous breathing, nonlabored ventilation, respiratory function stable and patient connected to nasal cannula oxygen Cardiovascular status: blood pressure returned to baseline and stable Postop Assessment: no apparent nausea or vomiting Anesthetic complications: no     Last Vitals:  Vitals:   03/21/19 1251 03/21/19 1301  BP: 119/83 (!) 145/78  Pulse: (!) 118 (!) 106  Resp: 17 20  Temp:    SpO2: 95% 94%    Last Pain:  Vitals:   03/21/19 1301  TempSrc:   PainSc: 0-No pain                 Martha Clan

## 2019-04-01 DIAGNOSIS — C159 Malignant neoplasm of esophagus, unspecified: Secondary | ICD-10-CM

## 2019-04-01 HISTORY — DX: Malignant neoplasm of esophagus, unspecified: C15.9

## 2019-04-05 ENCOUNTER — Other Ambulatory Visit: Payer: Self-pay | Admitting: Gastroenterology

## 2019-04-05 DIAGNOSIS — C155 Malignant neoplasm of lower third of esophagus: Secondary | ICD-10-CM

## 2019-04-05 LAB — SURGICAL PATHOLOGY

## 2019-04-06 ENCOUNTER — Other Ambulatory Visit
Admission: RE | Admit: 2019-04-06 | Discharge: 2019-04-06 | Disposition: A | Discharge status: Home / Self Care | Payer: Medicare Other | Source: Home / Self Care | Attending: Gastroenterology | Admitting: Gastroenterology

## 2019-04-06 ENCOUNTER — Other Ambulatory Visit: Payer: Self-pay

## 2019-04-06 ENCOUNTER — Ambulatory Visit
Admission: RE | Admit: 2019-04-06 | Discharge: 2019-04-06 | Disposition: A | Discharge status: Home / Self Care | Payer: Medicare Other | Source: Ambulatory Visit | Attending: Gastroenterology | Admitting: Gastroenterology

## 2019-04-06 DIAGNOSIS — C155 Malignant neoplasm of lower third of esophagus: Secondary | ICD-10-CM | POA: Diagnosis present

## 2019-04-06 LAB — CREATININE, SERUM
Creatinine, Ser: 1.13 mg/dL (ref 0.61–1.24)
GFR calc Af Amer: >60 mL/min (ref >60)
GFR calc non Af Amer: >60 mL/min (ref >60)

## 2019-04-06 MED ORDER — IOHEXOL 300 MG/ML  SOLN: 75.0000 mL | Freq: Once | INTRAMUSCULAR | Status: DC | PRN

## 2019-04-06 MED ORDER — IOHEXOL 300 MG/ML  SOLN
100.0000 mL | Freq: Once | INTRAMUSCULAR | Status: AC | PRN
  Administered 2019-04-06: 100 mL via INTRAVENOUS

## 2019-04-07 ENCOUNTER — Telehealth: Payer: Self-pay

## 2019-04-07 NOTE — Telephone Encounter (Signed)
Obtained referral and sent to scheduling. Will see Blake [name] at his initial consult.

## 2019-04-08 ENCOUNTER — Other Ambulatory Visit: Payer: Self-pay

## 2019-04-11 ENCOUNTER — Inpatient Hospital Stay: Payer: Medicare Other | Attending: Oncology | Admitting: Oncology

## 2019-04-11 ENCOUNTER — Encounter: Payer: Self-pay | Admitting: Oncology

## 2019-04-11 ENCOUNTER — Other Ambulatory Visit: Payer: Self-pay

## 2019-04-11 ENCOUNTER — Encounter: Payer: Self-pay | Admitting: *Deleted

## 2019-04-11 ENCOUNTER — Inpatient Hospital Stay: Payer: Medicare Other

## 2019-04-11 VITALS — HR 80 | Temp 96.2°F | Resp 18 | Ht 66.54 in | Wt 192.0 lb

## 2019-04-11 DIAGNOSIS — C159 Malignant neoplasm of esophagus, unspecified: Secondary | ICD-10-CM

## 2019-04-11 DIAGNOSIS — Z8589 Personal history of malignant neoplasm of other organs and systems: Secondary | ICD-10-CM | POA: Diagnosis not present

## 2019-04-11 DIAGNOSIS — K22719 Barrett's esophagus with dysplasia, unspecified: Secondary | ICD-10-CM | POA: Diagnosis not present

## 2019-04-11 DIAGNOSIS — C155 Malignant neoplasm of lower third of esophagus: Secondary | ICD-10-CM | POA: Insufficient documentation

## 2019-04-11 LAB — CBC WITH DIFFERENTIAL/PLATELET
Abs Immature Granulocytes: 0.02 10*3/uL (ref 0.00–0.07)
Basophils Absolute: 0.1 10*3/uL (ref 0.0–0.1)
Basophils Relative: 1 %
Eosinophils Absolute: 0.1 10*3/uL (ref 0.0–0.5)
Eosinophils Relative: 2 %
HCT: 48.8 % (ref 39.0–52.0)
Hemoglobin: 15.3 g/dL (ref 13.0–17.0)
Immature Granulocytes: 0 %
Lymphocytes Relative: 12 %
Lymphs Abs: 0.8 10*3/uL (ref 0.7–4.0)
MCH: 28.9 pg (ref 26.0–34.0)
MCHC: 31.4 g/dL (ref 30.0–36.0)
MCV: 92.1 fL (ref 80.0–100.0)
Monocytes Absolute: 0.5 10*3/uL (ref 0.1–1.0)
Monocytes Relative: 7 %
Neutro Abs: 5.1 10*3/uL (ref 1.7–7.7)
Neutrophils Relative %: 78 %
Platelets: 219 10*3/uL (ref 150–400)
RBC: 5.3 MIL/uL (ref 4.22–5.81)
RDW: 13 % (ref 11.5–15.5)
WBC: 6.6 10*3/uL (ref 4.0–10.5)
nRBC: 0 % (ref 0.0–0.2)

## 2019-04-11 LAB — COMPREHENSIVE METABOLIC PANEL
ALT: 37 U/L (ref 0–44)
AST: 23 U/L (ref 15–41)
Albumin: 4.2 g/dL (ref 3.5–5.0)
Alkaline Phosphatase: 91 U/L (ref 38–126)
Anion gap: 10 (ref 5–15)
BUN: 18 mg/dL (ref 8–23)
CO2: 25 mmol/L (ref 22–32)
Calcium: 8.8 mg/dL — ABNORMAL LOW (ref 8.9–10.3)
Chloride: 103 mmol/L (ref 98–111)
Creatinine, Ser: 1.04 mg/dL (ref 0.61–1.24)
GFR calc Af Amer: >60 mL/min (ref >60)
GFR calc non Af Amer: >60 mL/min (ref >60)
Glucose, Bld: 199 mg/dL — ABNORMAL HIGH (ref 70–99)
Potassium: 4.5 mmol/L (ref 3.5–5.1)
Sodium: 138 mmol/L (ref 135–145)
Total Bilirubin: 0.6 mg/dL (ref 0.3–1.2)
Total Protein: 7.2 g/dL (ref 6.5–8.1)

## 2019-04-11 NOTE — Progress Notes (Signed)
Patient reports trouble swallowing bread for the past 3 weeks.

## 2019-04-11 NOTE — Research (Signed)
Patient Blake [name]. [place] was referred by Dr. Tasia Catchings today for the [employer] blood collection study, and states the patient is ready to consent. After eligibility review and patient found to meet all of the eligibility criteria and none of the ineligibility criteria, patient was offered the study. Informed consent form read to patient and his wife Blake [name] and all questions satisfied. Stressed to patient that participation is voluntary and that he can withdraw at any time and for any reason. Also informed that per protocol, we will collect 5 tubes of blood to submit to the study, that we will not receive any results from their testing, and that he will be provided a $50 gift card after the specimens are collected. Risks and benefits of lab collection and providing information related to his PHI and how that will be kept private were discussed and patient informed that any information we submit to the study will have his name and any personal information removed. Patient provided written consent to participate by signing the [employer] 2018-01 Informed Consent and HIPAA form Version 3.0, dated Oct. 29, 2019. He also signed a [place]. Patient was given a copy of the signed consent forms as well as my card in the event he has additional questions later or decides to withdraw. Dr. Tasia Catchings had previously scheduled an appointment for labs this afternoon, so study labs were added on to this appointment. [employer] Study kit #[id number] was assigned to patient, and lab specimens were collected via peripheral venipuncture, processed, packaged and shipped per protocol. The patient did not experience any adverse events as a result of having labs collected. He was given a [employer] gift card, for which he signed as having received. Patient also completed the brief study questionnaire, providing his and his family's cancer history, and his personal smoking and alcohol consumption history. He agreed to be  available to answer any study-related questions that might come up in the next few days. Mr. Edmondson was thanked for participating in this research study. Dr. Tasia Catchings also agreed that the patient was eligible and signed the attestation form for [employer] study eligibility. Yolande Jolly, BSN, MHA, OCN 04/11/2019 1:14 PM  T/C made to patient earlier today to clarify a couple of medical history questions. Patient states he was diagnosed with diabetes about 18 months ago after his A1C returned significantly elevated. Upon record review, this was found in PCP note dated 10/22/2017. 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 He could not recall the actual date of the biopsy, but does report his treatment for this cancer included surgery, chemotherapy and radiation therapy. He has had no recurrence of the tongue cancer to date. Patient also states he is to have a PET scan in the morning since his recent CT scan did not show exactly where the current esophageal cancer is located or how extensive it is. Patient thanked again for participating in research. Yolande Jolly, BSN, MHA, OCN 04/12/2019 12:01 PM

## 2019-04-11 NOTE — Progress Notes (Signed)
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  male with PMH listed below was seen in consultation at the request of  Geanie Kenning, [place]*  for evaluation of esophageal cancer Patient has longstanding Barrett's esophagus. 03/21/2019 patient underwent surveillance endoscopy which showed esophageal mucosal changes secondary to long segment Barrett's disease present in the lower third of the esophagus.  Maximal longitudinal extent of these mucosal changes were 7 cm in length.  Mucosa was biopsied with cold forceps for histology in 4 quadrants at the interval of 2 cm in the lower third of esophagus.  2 cm hiatal hernia, gastritis. 03/21/2019 colonoscopy showed diverticulosis in the sigmoid colon.  4 mm polyp in ascending colon was removed. 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  1 out of 4 esophagus biopsy showed Barrett's mucosa with erosion, indefinite for s. Colon polyp biopsy showed tubular adenoma. Patient was referred to cancer center for further evaluation and management.  04/06/2019 CT chest abdomen pelvis showed no evidence of metastatic disease in the chest abdomen or pelvis.  No esophageal mass lesion evident.  Small to moderate hiatal hernia with possible wall thickening proximal stomach.  3 mm left lower lobe pulmonary nodule attention on follow-up recommended to ensure stability.  Tiny hypodensities in the left kidney with attenuation too high to be simple cyst.  Patient  was accompanied by his wife today.  He denies any pain, unintentional weight loss, fever, chills, night sweats. He reports some oropharyngeal dysphagia after endoscopy, especially with dry food like bread.  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 Jeb Levering.  His initial PET scan 11/08/2004 showed abnormal localization in a right neck mass possibly a lymph node, slightly asymmetric localization at the base of the tongue.  Pathology is not available in current EMR. He has had PET scan done 08/02/2008  Review of Systems  Constitutional: Negative for appetite change, chills, fatigue, fever and unexpected weight change.  HENT:   Negative for hearing loss and voice change.   Eyes: Negative for eye problems and icterus.  Respiratory: Negative for chest tightness, cough and shortness of breath.   Cardiovascular: Negative for chest pain and leg swelling.  Gastrointestinal: Negative for abdominal distention and abdominal pain.       Dysphagia  Endocrine: Negative for hot flashes.  Genitourinary: Negative for difficulty urinating, dysuria and frequency.   Musculoskeletal: Negative for arthralgias.  Skin: Negative for itching and rash.  Neurological: Negative for light-headedness and numbness.  Hematological: Negative for adenopathy. Does not bruise/bleed easily.  Psychiatric/Behavioral: Negative for confusion.    MEDICAL HISTORY:  Past Medical History:  Diagnosis Date  . Arthritis   . Back pain    with leg pain  . 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 Coronary artery disease   . Dyspnea   . GERD (gastroesophageal reflux disease)   . Hyperlipidemia   . Hypertension   . Numbness and  tingling of both lower extremities    with positioning  . Prostate enlargement     SURGICAL HISTORY: Past Surgical History:  Procedure Laterality Date  .  COLONOSCOPY  12/08/2003  . COLONOSCOPY WITH PROPOFOL N/A 03/21/2019   Procedure: COLONOSCOPY WITH PROPOFOL;  Surgeon: Toledo, Benay Pike, MD;  Location: [facility] ENDOSCOPY;  Service: Gastroenterology;  Laterality: N/A;  . ESOPHAGOGASTRODUODENOSCOPY (EGD) WITH PROPOFOL N/A 03/21/2019   Procedure: ESOPHAGOGASTRODUODENOSCOPY (EGD) WITH PROPOFOL;  Surgeon: Toledo, Benay Pike, MD;  Location: [facility] ENDOSCOPY;  Service: Gastroenterology;  Laterality: N/A;  . JOINT REPLACEMENT Right    total hip  . LUMBAR LAMINECTOMY/DECOMPRESSION MICRODISCECTOMY N/A 03/03/2018   Procedure: LUMBAR LAMINECTOMY/DECOMPRESSION MICRODISCECTOMY 1 LEVEL-L3-4,L4-5;  Surgeon: Meade Maw, MD;  Location: [facility] ORS;  Service: Neurosurgery;  Laterality: N/A;  . PARTIAL HIP ARTHROPLASTY  2014  . PORT-A-CATH REMOVAL    . TONGUE BIOPSY  2006  . TONSILLECTOMY    . TRIGGER FINGER RELEASE Right 2013  . UPPER GI ENDOSCOPY  01/25/14   multiple gastric polyps    SOCIAL HISTORY: Social History   Socioeconomic History  . Marital status: Married    Spouse name: Not on file  . Number of children: Not on file  . Years of education: Not on file  . Highest education level: Not on file  Occupational History  . Not on file  Tobacco Use  . Smoking status: Former Smoker    Packs/day: 1.00    Years: 30.00    Pack years: 30.00    Types: Cigarettes    Quit date: 02/18/1994    Years since quitting: 25.1  . Smokeless tobacco: Never Used  Substance and Sexual Activity  . Alcohol use: Yes  . Drug use: No  . Sexual activity: Yes  Other Topics Concern  . Not on file  Social History Narrative  . Not on file   Social Determinants of Health   Financial Resource Strain:   . Difficulty of Paying Living Expenses: Not on file  Food Insecurity:   . Worried About Charity fundraiser in the Last Year: Not on file  . Ran Out of Food in the Last Year: Not on file  Transportation Needs:   . Lack of Transportation (Medical): Not on file  .  Lack of Transportation (Non-Medical): Not on file  Physical Activity:   . Days of Exercise per Week: Not on file  . Minutes of Exercise per Session: Not on file  Stress:   . Feeling of Stress : Not on file  Social Connections:   . Frequency of Communication with Friends and Family: Not on file  . Frequency of Social Gatherings with Friends and Family: Not on file  . Attends Religious Services: Not on file  . Active Member of Clubs or Organizations: Not on file  . Attends Archivist Meetings: Not on file  . Marital Status: Not on file  Intimate Partner Violence:   . Fear of Current or Ex-Partner: Not on file  . Emotionally Abused: Not on file  . Physically Abused: Not on file  . Sexually Abused: Not on file    FAMILY HISTORY: Family History  Problem Relation Age of Onset  . Diabetes Mother   . Lung cancer Father     ALLERGIES:  is allergic to sulfa antibiotics.  MEDICATIONS:  Current Outpatient Medications  Medication Sig Dispense Refill  . aspirin EC 81 MG tablet Take 81 mg by mouth daily.    [name] Kitchen atorvastatin (LIPITOR) 80 MG tablet  Take 80 mg by mouth daily.   2  . Blood Glucose Monitoring Suppl (ONE TOUCH ULTRA SYSTEM KIT) w/Device KIT     . gabapentin (NEURONTIN) 300 MG capsule     . glimepiride (AMARYL) 4 MG tablet Take 4 mg by mouth daily with breakfast.    . ibuprofen (ADVIL) 800 MG tablet Take 800 mg by mouth every 8 (eight) hours as needed.    [name] Kitchen lisinopril (PRINIVIL,ZESTRIL) 10 MG tablet Take 10 mg by mouth 2 (two) times daily.     . meloxicam (MOBIC) 15 MG tablet Take 15 mg by mouth daily.    . methocarbamol (ROBAXIN) 500 MG tablet Take 1 tablet (500 mg total) by mouth every 6 (six) hours as needed for muscle spasms. 90 tablet 0  . metoprolol succinate (TOPROL-XL) 50 MG 24 hr tablet Take 50 mg by mouth daily.     . Omega-3 1000 MG CAPS Take by mouth.    [name] Kitchen omeprazole (PRILOSEC) 40 MG capsule Take 40 mg by mouth daily as needed (heartburn).   3  .  polyethylene glycol powder (GLYCOLAX/MIRALAX) powder 255 grams one bottle for colonoscopy prep 255 g 0  . tamsulosin (FLOMAX) 0.4 MG CAPS capsule Take 0.4 mg by mouth daily after supper.     [name] Kitchen oxyCODONE (OXY IR/ROXICODONE) 5 MG immediate release tablet Take 1 tablet (5 mg total) by mouth every 4 (four) hours as needed for moderate pain ((score 4 to 6)). (Patient not taking: Reported on 04/11/2019) 30 tablet 0   No current facility-administered medications for this visit.     PHYSICAL EXAMINATION: ECOG PERFORMANCE STATUS: 1 - Symptomatic but completely ambulatory Vitals:   04/11/19 1119  Pulse: 80  Resp: 18  Temp: (!) 96.2 F (35.7 C)   Filed Weights   04/11/19 1119  Weight: 192 lb (87.1 kg)    Physical Exam Constitutional:      General: He is not in acute distress. HENT:     Head: Normocephalic and atraumatic.  Eyes:     General: No scleral icterus.    Pupils: Pupils are equal, round, and reactive to light.  Cardiovascular:     Rate and Rhythm: Normal rate and regular rhythm.     Heart sounds: Normal heart sounds.  Pulmonary:     Effort: Pulmonary effort is normal. No respiratory distress.     Breath sounds: No wheezing.  Abdominal:     General: Bowel sounds are normal. There is no distension.     Palpations: Abdomen is soft. There is no mass.     Tenderness: There is no abdominal tenderness.  Musculoskeletal:        General: No deformity. Normal range of motion.     Cervical back: Normal range of motion and neck supple.  Skin:    General: Skin is warm and dry.     Findings: No erythema or rash.  Neurological:     Mental Status: He is alert and oriented to person, place, and time.     Cranial Nerves: No cranial nerve deficit.     Coordination: Coordination normal.  Psychiatric:        Behavior: Behavior normal.        Thought Content: Thought content normal.     LABORATORY DATA:  I have reviewed the data as listed Lab Results  Component Value Date   WBC 6.6  04/11/2019   HGB 15.3 04/11/2019   HCT 48.8 04/11/2019   MCV 92.1 04/11/2019   PLT 219 04/11/2019  Recent Labs    04/06/19 1150 04/11/19 1230  NA  --  138  K  --  4.5  CL  --  103  CO2  --  25  GLUCOSE  --  199*  BUN  --  18  CREATININE 1.13 1.04  CALCIUM  --  8.8*  GFRNONAA >60 >60  GFRAA >60 >60  PROT  --  7.2  ALBUMIN  --  4.2  AST  --  23  ALT  --  37  ALKPHOS  --  91  BILITOT  --  0.6   Iron/TIBC/Ferritin/ %Sat No results found for: IRON, TIBC, FERRITIN, IRONPCTSAT    RADIOGRAPHIC STUDIES: I have personally reviewed the radiological images as listed and agreed with the findings in the report.  CT CHEST W CONTRAST  Result Date: 04/06/2019 CLINICAL DATA:  Esophageal cancer. Staging. EXAM: CT CHEST, ABDOMEN, AND PELVIS WITH CONTRAST TECHNIQUE: Multidetector CT imaging of the chest, abdomen and pelvis was performed following the standard protocol during bolus administration of intravenous contrast. CONTRAST:  148m OMNIPAQUE IOHEXOL 300 MG/ML  SOLN COMPARISON:  None. PET-CT 08/02/2008 FINDINGS: CT CHEST FINDINGS Cardiovascular: The heart size is normal. No substantial pericardial effusion. Coronary artery calcification is evident. Atherosclerotic calcification is noted in the wall of the thoracic aorta. Mediastinum/Nodes: No mediastinal lymphadenopathy. There is no hilar lymphadenopathy. No discernible esophageal mass in this patient with reported history of esophageal cancer. Small to moderate hiatal hernia noted. There is no axillary lymphadenopathy. Lungs/Pleura: 3 mm left lower lobe nodule identified on 126/4. No focal airspace consolidation. No pleural effusion. Musculoskeletal: No worrisome lytic or sclerotic osseous abnormality. Degenerative changes noted in the shoulders bilaterally. CT ABDOMEN PELVIS FINDINGS Hepatobiliary: No suspicious focal abnormality within the liver parenchyma. There is no evidence for gallstones, gallbladder wall thickening, or pericholecystic  fluid. No intrahepatic or extrahepatic biliary dilation. Pancreas: No focal mass lesion. No dilatation of the main duct. No intraparenchymal cyst. No peripancreatic edema. Spleen: No splenomegaly. No focal mass lesion. Adrenals/Urinary Tract: No adrenal nodule or mass. Cortical scarring noted in both kidneys without overtly suspicious renal mass lesion. Tiny hypoattenuating lesions in the left kidney are too small to characterize although attenuation is higher than would be expected for simple cysts. No evidence for hydroureter. The urinary bladder appears normal for the degree of distention. Stomach/Bowel: Small to moderate hiatal hernia with possible wall thickening proximal stomach. Duodenum is normally positioned as is the ligament of Treitz. No small bowel wall thickening. No small bowel dilatation. The terminal ileum is normal. The appendix is normal. No gross colonic mass. No colonic wall thickening. Vascular/Lymphatic: There is abdominal aortic atherosclerosis without aneurysm. There is no gastrohepatic or hepatoduodenal ligament lymphadenopathy. No retroperitoneal or mesenteric lymphadenopathy. No pelvic sidewall lymphadenopathy. Reproductive: Prostate gland largely obscured by beam hardening artifact from right hip replacement. Other: No intraperitoneal free fluid. Musculoskeletal: Status post right hip replacement 9 mm sclerotic lesion left iliac bone is stable since prior PET-CT consistent with benign etiology. No worrisome lytic or sclerotic osseous abnormality. IMPRESSION: 1. No evidence for metastatic disease in the chest, abdomen, or pelvis. 2. No esophageal mass lesion evident this patient with a reported history of esophageal cancer. Small to moderate hiatal hernia with possible wall thickening proximal stomach. 3. 3 mm left lower lobe pulmonary nodule. Attention on follow-up recommended to ensure stability. 4. Tiny hypodensities in the left kidney with attenuation too high to be simple cysts.  These may well be cysts complicated by proteinaceous debris or hemorrhage.  MRI abdomen with and without contrast could be used to confirm. 5. Aortic Atherosclerosis (ICD10-I70.0). Electronically Signed   By: Misty Stanley M.D.   On: 04/06/2019 12:56   CT ABDOMEN PELVIS W CONTRAST  Result Date: 04/06/2019 CLINICAL DATA:  Esophageal cancer. Staging. EXAM: CT CHEST, ABDOMEN, AND PELVIS WITH CONTRAST TECHNIQUE: Multidetector CT imaging of the chest, abdomen and pelvis was performed following the standard protocol during bolus administration of intravenous contrast. CONTRAST:  130m OMNIPAQUE IOHEXOL 300 MG/ML  SOLN COMPARISON:  None. PET-CT 08/02/2008 FINDINGS: CT CHEST FINDINGS Cardiovascular: The heart size is normal. No substantial pericardial effusion. Coronary artery calcification is evident. Atherosclerotic calcification is noted in the wall of the thoracic aorta. Mediastinum/Nodes: No mediastinal lymphadenopathy. There is no hilar lymphadenopathy. No discernible esophageal mass in this patient with reported history of esophageal cancer. Small to moderate hiatal hernia noted. There is no axillary lymphadenopathy. Lungs/Pleura: 3 mm left lower lobe nodule identified on 126/4. No focal airspace consolidation. No pleural effusion. Musculoskeletal: No worrisome lytic or sclerotic osseous abnormality. Degenerative changes noted in the shoulders bilaterally. CT ABDOMEN PELVIS FINDINGS Hepatobiliary: No suspicious focal abnormality within the liver parenchyma. There is no evidence for gallstones, gallbladder wall thickening, or pericholecystic fluid. No intrahepatic or extrahepatic biliary dilation. Pancreas: No focal mass lesion. No dilatation of the main duct. No intraparenchymal cyst. No peripancreatic edema. Spleen: No splenomegaly. No focal mass lesion. Adrenals/Urinary Tract: No adrenal nodule or mass. Cortical scarring noted in both kidneys without overtly suspicious renal mass lesion. Tiny hypoattenuating  lesions in the left kidney are too small to characterize although attenuation is higher than would be expected for simple cysts. No evidence for hydroureter. The urinary bladder appears normal for the degree of distention. Stomach/Bowel: Small to moderate hiatal hernia with possible wall thickening proximal stomach. Duodenum is normally positioned as is the ligament of Treitz. No small bowel wall thickening. No small bowel dilatation. The terminal ileum is normal. The appendix is normal. No gross colonic mass. No colonic wall thickening. Vascular/Lymphatic: There is abdominal aortic atherosclerosis without aneurysm. There is no gastrohepatic or hepatoduodenal ligament lymphadenopathy. No retroperitoneal or mesenteric lymphadenopathy. No pelvic sidewall lymphadenopathy. Reproductive: Prostate gland largely obscured by beam hardening artifact from right hip replacement. Other: No intraperitoneal free fluid. Musculoskeletal: Status post right hip replacement 9 mm sclerotic lesion left iliac bone is stable since prior PET-CT consistent with benign etiology. No worrisome lytic or sclerotic osseous abnormality. IMPRESSION: 1. No evidence for metastatic disease in the chest, abdomen, or pelvis. 2. No esophageal mass lesion evident this patient with a reported history of esophageal cancer. Small to moderate hiatal hernia with possible wall thickening proximal stomach. 3. 3 mm left lower lobe pulmonary nodule. Attention on follow-up recommended to ensure stability. 4. Tiny hypodensities in the left kidney with attenuation too high to be simple cysts. These may well be cysts complicated by proteinaceous debris or hemorrhage. MRI abdomen with and without contrast could be used to confirm. 5. Aortic Atherosclerosis (ICD10-I70.0). Electronically Signed   By: EMisty StanleyM.D.   On: 04/06/2019 12:56      ASSESSMENT & PLAN:  1. Malignant neoplasm of esophagus, unspecified location ([place]   2. Barrett's esophagus with  dysplasia   3. History of head and neck cancer    #Images were independent reviewed by me and discussed with patient. Endoscopy pathology was reviewed and discussed. No radiographic M1 disease.  Small bowel nausea and small left kidney hypodensities. Recommend to obtain PET scan to rule out  M1 disease. Likely patient has early stage esophageal cancer. He will need EUS evaluation for possible endoscopic resection/ablation/esophagectomy.   History of head and neck cancer, will obtain previous pathology and oncology note from previous EMR. Check CBC, CMP, CEA.  Patient agrees to participate in exact science clinical trial.  Refer to research department.  Orders Placed This Encounter  Procedures  . NM PET Image Initial (PI) Skull Base To Thigh    Standing Status:   Future    Standing Expiration Date:   04/10/2020    Order Specific Question:   If indicated for the ordered procedure, I authorize the administration of a radiopharmaceutical per Radiology protocol    Answer:   Yes    Order Specific Question:   Preferred imaging location?    Answer:   [facility]    Order Specific Question:   Radiology Contrast Protocol - do NOT remove file path    Answer:   [email or web address]  . CBC with Differential/Platelet    Standing Status:   Future    Number of Occurrences:   1    Standing Expiration Date:   04/10/2020  . Comprehensive metabolic panel    Standing Status:   Future    Number of Occurrences:   1    Standing Expiration Date:   04/10/2020  . CEA    Standing Status:   Future    Number of Occurrences:   1    Standing Expiration Date:   04/10/2020    All questions were answered. The patient knows to call the clinic with any problems questions or concerns.  cc Geanie Kenning, [place]*    Return of visit:  Thank you for this kind referral and the opportunity to participate in the care of this patient. A copy of today's note is routed to referring provider        Earlie Server, MD, PhD Hematology Oncology [facility] at [facility] Pager- [phone] 04/11/2019

## 2019-04-12 ENCOUNTER — Telehealth: Payer: Self-pay

## 2019-04-12 LAB — CEA: CEA: 2.1 ng/mL (ref 0.0–4.7)

## 2019-04-12 NOTE — Telephone Encounter (Signed)
Called and spoke with Blake [name]. He is scheduled for PET scan 04/13/19. Referral sent to Dr. Hilbert Corrigan at [facility] to evaluate for ESD.

## 2019-04-13 ENCOUNTER — Other Ambulatory Visit: Payer: Self-pay

## 2019-04-13 ENCOUNTER — Encounter
Admission: RE | Admit: 2019-04-13 | Discharge: 2019-04-13 | Disposition: A | Discharge status: Home / Self Care | Payer: Medicare Other | Source: Ambulatory Visit | Attending: Oncology | Admitting: Oncology

## 2019-04-13 DIAGNOSIS — C159 Malignant neoplasm of esophagus, unspecified: Secondary | ICD-10-CM | POA: Diagnosis present

## 2019-04-13 DIAGNOSIS — I2510 Atherosclerotic heart disease of native coronary artery without angina pectoris: Secondary | ICD-10-CM | POA: Diagnosis not present

## 2019-04-13 DIAGNOSIS — I700 Atherosclerosis of aorta: Secondary | ICD-10-CM | POA: Insufficient documentation

## 2019-04-13 DIAGNOSIS — K449 Diaphragmatic hernia without obstruction or gangrene: Secondary | ICD-10-CM | POA: Insufficient documentation

## 2019-04-13 LAB — GLUCOSE, CAPILLARY: Glucose-Capillary: 193 mg/dL — ABNORMAL HIGH (ref 70–99)

## 2019-04-13 MED ORDER — FLUDEOXYGLUCOSE F - 18 (FDG) INJECTION
9.9000 | Freq: Once | INTRAVENOUS | Status: AC | PRN
  Administered 2019-04-13: 9.5 via INTRAVENOUS

## 2019-04-21 ENCOUNTER — Ambulatory Visit: Payer: Medicare Other | Attending: Internal Medicine

## 2019-04-21 DIAGNOSIS — Z23 Encounter for immunization: Secondary | ICD-10-CM | POA: Insufficient documentation

## 2019-04-21 NOTE — Progress Notes (Signed)
   Covid-19 Vaccination Clinic  Name:  SUNDEEP KOSKO    MRN: [id number] DOB: 06-01-1945  04/21/2019  Mr. Aument was observed post Covid-19 immunization for 15 minutes without incidence. He was provided with Vaccine Information Sheet and instruction to access the V-Safe system.   Mr. Hardt was instructed to call 911 with any severe reactions post vaccine: [name] Kitchen Difficulty breathing  . Swelling of your face and throat  . A fast heartbeat  . A bad rash all over your body  . Dizziness and weakness    Immunizations Administered    Name Date Dose VIS Date Route   Pfizer COVID-19 Vaccine 04/21/2019  3:46 PM 0.3 mL 03/11/2019 Intramuscular   Manufacturer: [place]   Lot: [id number]   [place]: [id number]

## 2019-04-25 ENCOUNTER — Telehealth: Payer: Self-pay

## 2019-04-25 NOTE — Telephone Encounter (Signed)
Call placed to [facility] to help get appointment coordinated. Spoke with scheduler and they are required to enter certain information into the system and schedule with provider on the list. Neither Dr. Leroy Sea or Dr. Harl Bowie populate as an esophageal provider. They stated he will be scheduled with Dr. Egbert Garibaldi and then Dr. Leroy Sea can perform ESD if he is a candidate. Took the appointment on the same date as before. 05/10/19 at 1430. Arrive at 1400. Called and explained this process to [name]. We will follow up after consult and refer to surgery if ESD is not an option.

## 2019-04-26 ENCOUNTER — Ambulatory Visit (INDEPENDENT_AMBULATORY_CARE_PROVIDER_SITE_OTHER): Payer: Medicare Other | Admitting: Vascular Surgery

## 2019-04-26 ENCOUNTER — Ambulatory Visit (INDEPENDENT_AMBULATORY_CARE_PROVIDER_SITE_OTHER): Payer: Medicare Other

## 2019-04-26 ENCOUNTER — Other Ambulatory Visit: Payer: Self-pay

## 2019-04-26 ENCOUNTER — Encounter (INDEPENDENT_AMBULATORY_CARE_PROVIDER_SITE_OTHER): Payer: Self-pay | Admitting: Vascular Surgery

## 2019-04-26 VITALS — BP 154/84 | HR 84 | Resp 16 | Ht 66.0 in | Wt 189.0 lb

## 2019-04-26 DIAGNOSIS — E119 Type 2 diabetes mellitus without complications: Secondary | ICD-10-CM | POA: Diagnosis not present

## 2019-04-26 DIAGNOSIS — I10 Essential (primary) hypertension: Secondary | ICD-10-CM

## 2019-04-26 DIAGNOSIS — I6523 Occlusion and stenosis of bilateral carotid arteries: Secondary | ICD-10-CM

## 2019-04-26 DIAGNOSIS — M1990 Unspecified osteoarthritis, unspecified site: Secondary | ICD-10-CM | POA: Insufficient documentation

## 2019-04-26 DIAGNOSIS — C029 Malignant neoplasm of tongue, unspecified: Secondary | ICD-10-CM | POA: Insufficient documentation

## 2019-04-26 DIAGNOSIS — E785 Hyperlipidemia, unspecified: Secondary | ICD-10-CM | POA: Diagnosis not present

## 2019-04-26 NOTE — Assessment & Plan Note (Signed)
Carotid duplex today with little change on the lower end of 40 to 59% on the right and in the 60 to 79% on the left.  No role for surgery at this time.  Continue aspirin and statin agent.  Recheck in 6 months.

## 2019-04-26 NOTE — Progress Notes (Signed)
MRN : [id number]  Blake [name] is a 74 y.o. (1945/05/14) male who presents with chief complaint of  Chief Complaint  Patient presents with  . Follow-up    ultrasound  .  History of Present Illness: Patient returns in follow-up of his carotid disease.  He has been diagnosed with esophageal cancer since his last visit.  Goes to see the surgeon at [facility] next week.  No focal neurologic symptoms. Specifically, the patient denies amaurosis fugax, speech or swallowing difficulties, or arm or leg weakness or numbness. Carotid duplex today with little change on the lower end of 40 to 59% on the right and in the 60 to 79% on the left  Current Outpatient Medications  Medication Sig Dispense Refill  . aspirin EC 81 MG tablet Take 81 mg by mouth daily.    [name] Kitchen atorvastatin (LIPITOR) 80 MG tablet Take 80 mg by mouth daily.   2  . Blood Glucose Monitoring Suppl (ONE TOUCH ULTRA SYSTEM KIT) w/Device KIT     . gabapentin (NEURONTIN) 300 MG capsule     . glimepiride (AMARYL) 4 MG tablet Take 4 mg by mouth daily with breakfast.    . ibuprofen (ADVIL) 800 MG tablet Take 800 mg by mouth every 8 (eight) hours as needed.    [name] Kitchen lisinopril (PRINIVIL,ZESTRIL) 10 MG tablet Take 10 mg by mouth 2 (two) times daily.     . meloxicam (MOBIC) 15 MG tablet Take 15 mg by mouth daily.    . methocarbamol (ROBAXIN) 500 MG tablet Take 1 tablet (500 mg total) by mouth every 6 (six) hours as needed for muscle spasms. 90 tablet 0  . metoprolol succinate (TOPROL-XL) 50 MG 24 hr tablet Take 50 mg by mouth daily.     [name] Kitchen omeprazole (PRILOSEC) 40 MG capsule Take 40 mg by mouth daily as needed (heartburn).   3  . tamsulosin (FLOMAX) 0.4 MG CAPS capsule Take 0.4 mg by mouth daily after supper.     . Omega-3 1000 MG CAPS Take by mouth.    . oxyCODONE (OXY IR/ROXICODONE) 5 MG immediate release tablet Take 1 tablet (5 mg total) by mouth every 4 (four) hours as needed for moderate pain ((score 4 to 6)). (Patient not taking: Reported on  04/11/2019) 30 tablet 0  . polyethylene glycol powder (GLYCOLAX/MIRALAX) powder 255 grams one bottle for colonoscopy prep (Patient not taking: Reported on 04/26/2019) 255 g 0   No current facility-administered medications for this visit.    Past Medical History:  Diagnosis Date  . Arthritis   . Back pain    with leg pain  . 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 Coronary artery disease   . Dyspnea   . GERD (gastroesophageal reflux disease)   . Hyperlipidemia   . Hypertension   . Numbness and tingling of both lower extremities    with positioning  . Prostate enlargement     Past Surgical History:  Procedure Laterality Date  . COLONOSCOPY  12/08/2003  . COLONOSCOPY WITH PROPOFOL N/A 03/21/2019   Procedure: COLONOSCOPY WITH PROPOFOL;  Surgeon: Toledo, Benay Pike, MD;  Location: [facility];  Service: Gastroenterology;  Laterality: N/A;  . ESOPHAGOGASTRODUODENOSCOPY (EGD) WITH PROPOFOL N/A 03/21/2019   Procedure: ESOPHAGOGASTRODUODENOSCOPY (EGD) WITH PROPOFOL;  Surgeon: Toledo, Benay Pike, MD;  Location: [facility] ENDOSCOPY;  Service: Gastroenterology;  Laterality: N/A;  . esophogeal cancer    . JOINT REPLACEMENT Right    total  hip  . LUMBAR LAMINECTOMY/DECOMPRESSION MICRODISCECTOMY N/A 03/03/2018   Procedure: LUMBAR LAMINECTOMY/DECOMPRESSION MICRODISCECTOMY 1 LEVEL-L3-4,L4-5;  Surgeon: Meade Maw, MD;  Location: [facility] ORS;  Service: Neurosurgery;  Laterality: N/A;  . PARTIAL HIP ARTHROPLASTY  2014  . PORT-A-CATH REMOVAL    . TONGUE BIOPSY  2006  . TONSILLECTOMY    . TRIGGER FINGER RELEASE Right 2013  . UPPER GI ENDOSCOPY  01/25/14   multiple gastric polyps     Social History   Tobacco Use  . Smoking status: Former Smoker    Packs/day: 1.00    Years: 30.00    Pack years: 30.00    Types: Cigarettes    Quit date: 02/18/1994    Years since quitting: 25.2  . Smokeless tobacco: Never Used  Substance Use Topics  .  Alcohol use: Yes  . Drug use: No    Family History  Problem Relation Age of Onset  . Diabetes Mother   . Lung cancer Father      Allergies  Allergen Reactions  . Sulfa Antibiotics Rash     REVIEW OF SYSTEMS (Negative unless checked)  Constitutional: _0 Weight loss  _1 Fever  _2 Chills Cardiac: _3 Chest pain   _4 Chest pressure   _5 Palpitations   _6 Shortness of breath when laying flat   _7 Shortness of breath at rest   _8 Shortness of breath with exertion. Vascular:  _9 Pain in legs with walking   _10 Pain in legs at rest   _11 Pain in legs when laying flat   _12 Claudication   _13 Pain in feet when walking  _14 Pain in feet at rest  _15 Pain in feet when laying flat   _16 History of DVT   _17 Phlebitis   _18 Swelling in legs   _19 Varicose veins   _20 Non-healing ulcers Pulmonary:   _21 Uses home oxygen   _22 Productive cough   _23 Hemoptysis   _24 Wheeze  _25 COPD   _26 Asthma Neurologic:  _27 Dizziness  _28 Blackouts   _29 Seizures   _30 History of stroke   _31 History of TIA  _32 Aphasia   _33 Temporary blindness   _34 Dysphagia   _35 Weakness or numbness in arms   _36 Weakness or numbness in legs Musculoskeletal:  _37 Arthritis   _38 Joint swelling   _39 Joint pain   _40 Low back pain Hematologic:  _41 Easy bruising  _42 Easy bleeding   _43 Hypercoagulable state   _44 Anemic  _45 Hepatitis Gastrointestinal:  _46 Blood in stool   _47 Vomiting blood  _48 Gastroesophageal reflux/heartburn   _49 Difficulty swallowing. Genitourinary:  _50 Chronic kidney disease   _51 Difficult urination  _52 Frequent urination  _53 Burning with urination   _54 Blood in urine Skin:  _55 Rashes   _56 Ulcers   _57 Wounds Psychological:  _58 History of anxiety   _59  History of major depression.  Physical Examination  Vitals:   04/26/19 1432 04/26/19 1433  BP: (!) 171/71 (!) 154/84  Pulse: 84   Resp: 16   Weight: 189 lb (85.7 kg)   Height: _60  (1.676 m)    Body mass index is 30.51 kg/m. Gen:  WD/WN, NAD Head: Oliver/AT, No temporalis wasting. Ear/Nose/Throat: Hearing grossly intact, nares w/o  erythema or drainage, trachea midline Eyes: Conjunctiva clear. Sclera non-icteric Neck: Supple.  Left carotid bruit  Pulmonary:  Good air movement, equal and clear to auscultation bilaterally.  Cardiac: RRR, No JVD Vascular:  Vessel Right Left  Radial Palpable Palpable       Musculoskeletal: M/S 5/5 throughout.  No deformity or atrophy.  No edema. Neurologic: CN 2-12 intact. Sensation grossly intact in extremities.  Symmetrical.  Speech is fluent. Motor exam as listed above. Psychiatric: Judgment intact, Mood & affect appropriate for pt's  clinical situation. Dermatologic: No rashes or ulcers noted.  No cellulitis or open wounds.      CBC Lab Results  Component Value Date   WBC 6.6 04/11/2019   HGB 15.3 04/11/2019   HCT 48.8 04/11/2019   MCV 92.1 04/11/2019   PLT 219 04/11/2019    BMET    Component Value Date/Time   NA 138 04/11/2019 1230   NA 130 (L) 01/26/2013 0505   K 4.5 04/11/2019 1230   K 4.6 01/26/2013 0505   CL 103 04/11/2019 1230   CL 101 01/26/2013 0505   CO2 25 04/11/2019 1230   CO2 26 01/26/2013 0505   GLUCOSE 199 (H) 04/11/2019 1230   GLUCOSE 138 (H) 01/26/2013 0505   BUN 18 04/11/2019 1230   BUN 19 (H) 01/26/2013 0505   CREATININE 1.04 04/11/2019 1230   CREATININE 1.18 01/26/2013 0505   CALCIUM 8.8 (L) 04/11/2019 1230   CALCIUM 8.3 (L) 01/26/2013 0505   GFRNONAA >60 04/11/2019 1230   GFRNONAA >60 01/26/2013 0505   GFRAA >60 04/11/2019 1230   GFRAA >60 01/26/2013 0505   Estimated Creatinine Clearance: 65 mL/min (by C-G formula based on SCr of 1.04 mg/dL).  COAG Lab Results  Component Value Date   INR 0.89 02/18/2018   INR 0.9 01/12/2013    Radiology CT CHEST W CONTRAST  Result Date: 04/06/2019 CLINICAL DATA:  Esophageal cancer. Staging. EXAM: CT CHEST, ABDOMEN, AND PELVIS WITH CONTRAST TECHNIQUE: Multidetector CT imaging of the chest, abdomen and pelvis was performed following the standard protocol during bolus administration of intravenous  contrast. CONTRAST:  131m OMNIPAQUE IOHEXOL 300 MG/ML  SOLN COMPARISON:  None. PET-CT 08/02/2008 FINDINGS: CT CHEST FINDINGS Cardiovascular: The heart size is normal. No substantial pericardial effusion. Coronary artery calcification is evident. Atherosclerotic calcification is noted in the wall of the thoracic aorta. Mediastinum/Nodes: No mediastinal lymphadenopathy. There is no hilar lymphadenopathy. No discernible esophageal mass in this patient with reported history of esophageal cancer. Small to moderate hiatal hernia noted. There is no axillary lymphadenopathy. Lungs/Pleura: 3 mm left lower lobe nodule identified on 126/4. No focal airspace consolidation. No pleural effusion. Musculoskeletal: No worrisome lytic or sclerotic osseous abnormality. Degenerative changes noted in the shoulders bilaterally. CT ABDOMEN PELVIS FINDINGS Hepatobiliary: No suspicious focal abnormality within the liver parenchyma. There is no evidence for gallstones, gallbladder wall thickening, or pericholecystic fluid. No intrahepatic or extrahepatic biliary dilation. Pancreas: No focal mass lesion. No dilatation of the main duct. No intraparenchymal cyst. No peripancreatic edema. Spleen: No splenomegaly. No focal mass lesion. Adrenals/Urinary Tract: No adrenal nodule or mass. Cortical scarring noted in both kidneys without overtly suspicious renal mass lesion. Tiny hypoattenuating lesions in the left kidney are too small to characterize although attenuation is higher than would be expected for simple cysts. No evidence for hydroureter. The urinary bladder appears normal for the degree of distention. Stomach/Bowel: Small to moderate hiatal hernia with possible wall thickening proximal stomach. Duodenum is normally positioned as is the ligament of Treitz. No small bowel wall thickening. No small bowel dilatation. The terminal ileum is normal. The appendix is normal. No gross colonic mass. No colonic wall thickening. Vascular/Lymphatic:  There is abdominal aortic atherosclerosis without aneurysm. There is no gastrohepatic or hepatoduodenal ligament lymphadenopathy. No retroperitoneal or mesenteric lymphadenopathy. No pelvic sidewall lymphadenopathy. Reproductive: Prostate gland largely obscured by beam hardening artifact from right hip replacement. Other: No intraperitoneal free fluid. Musculoskeletal: Status post right hip replacement 9 mm sclerotic lesion left iliac bone is stable since prior PET-CT  consistent with benign etiology. No worrisome lytic or sclerotic osseous abnormality. IMPRESSION: 1. No evidence for metastatic disease in the chest, abdomen, or pelvis. 2. No esophageal mass lesion evident this patient with a reported history of esophageal cancer. Small to moderate hiatal hernia with possible wall thickening proximal stomach. 3. 3 mm left lower lobe pulmonary nodule. Attention on follow-up recommended to ensure stability. 4. Tiny hypodensities in the left kidney with attenuation too high to be simple cysts. These may well be cysts complicated by proteinaceous debris or hemorrhage. MRI abdomen with and without contrast could be used to confirm. 5. Aortic Atherosclerosis (ICD10-I70.0). Electronically Signed   By: Misty Stanley M.D.   On: 04/06/2019 12:56   CT ABDOMEN PELVIS W CONTRAST  Result Date: 04/06/2019 CLINICAL DATA:  Esophageal cancer. Staging. EXAM: CT CHEST, ABDOMEN, AND PELVIS WITH CONTRAST TECHNIQUE: Multidetector CT imaging of the chest, abdomen and pelvis was performed following the standard protocol during bolus administration of intravenous contrast. CONTRAST:  1107m OMNIPAQUE IOHEXOL 300 MG/ML  SOLN COMPARISON:  None. PET-CT 08/02/2008 FINDINGS: CT CHEST FINDINGS Cardiovascular: The heart size is normal. No substantial pericardial effusion. Coronary artery calcification is evident. Atherosclerotic calcification is noted in the wall of the thoracic aorta. Mediastinum/Nodes: No mediastinal lymphadenopathy. There is no  hilar lymphadenopathy. No discernible esophageal mass in this patient with reported history of esophageal cancer. Small to moderate hiatal hernia noted. There is no axillary lymphadenopathy. Lungs/Pleura: 3 mm left lower lobe nodule identified on 126/4. No focal airspace consolidation. No pleural effusion. Musculoskeletal: No worrisome lytic or sclerotic osseous abnormality. Degenerative changes noted in the shoulders bilaterally. CT ABDOMEN PELVIS FINDINGS Hepatobiliary: No suspicious focal abnormality within the liver parenchyma. There is no evidence for gallstones, gallbladder wall thickening, or pericholecystic fluid. No intrahepatic or extrahepatic biliary dilation. Pancreas: No focal mass lesion. No dilatation of the main duct. No intraparenchymal cyst. No peripancreatic edema. Spleen: No splenomegaly. No focal mass lesion. Adrenals/Urinary Tract: No adrenal nodule or mass. Cortical scarring noted in both kidneys without overtly suspicious renal mass lesion. Tiny hypoattenuating lesions in the left kidney are too small to characterize although attenuation is higher than would be expected for simple cysts. No evidence for hydroureter. The urinary bladder appears normal for the degree of distention. Stomach/Bowel: Small to moderate hiatal hernia with possible wall thickening proximal stomach. Duodenum is normally positioned as is the ligament of Treitz. No small bowel wall thickening. No small bowel dilatation. The terminal ileum is normal. The appendix is normal. No gross colonic mass. No colonic wall thickening. Vascular/Lymphatic: There is abdominal aortic atherosclerosis without aneurysm. There is no gastrohepatic or hepatoduodenal ligament lymphadenopathy. No retroperitoneal or mesenteric lymphadenopathy. No pelvic sidewall lymphadenopathy. Reproductive: Prostate gland largely obscured by beam hardening artifact from right hip replacement. Other: No intraperitoneal free fluid. Musculoskeletal: Status post  right hip replacement 9 mm sclerotic lesion left iliac bone is stable since prior PET-CT consistent with benign etiology. No worrisome lytic or sclerotic osseous abnormality. IMPRESSION: 1. No evidence for metastatic disease in the chest, abdomen, or pelvis. 2. No esophageal mass lesion evident this patient with a reported history of esophageal cancer. Small to moderate hiatal hernia with possible wall thickening proximal stomach. 3. 3 mm left lower lobe pulmonary nodule. Attention on follow-up recommended to ensure stability. 4. Tiny hypodensities in the left kidney with attenuation too high to be simple cysts. These may well be cysts complicated by proteinaceous debris or hemorrhage. MRI abdomen with and without contrast could be used to confirm.  5. Aortic Atherosclerosis (ICD10-I70.0). Electronically Signed   By: Misty Stanley M.D.   On: 04/06/2019 12:56   NM PET Image Initial (PI) Skull Base To Thigh  Result Date: 04/13/2019 CLINICAL DATA:  Initial treatment strategy for esophageal carcinoma. EXAM: NUCLEAR MEDICINE PET SKULL BASE TO THIGH TECHNIQUE: 9.5 mCi F-18 FDG was injected intravenously. Full-ring PET imaging was performed from the skull base to thigh after the radiotracer. CT data was obtained and used for attenuation correction and anatomic localization. Fasting blood glucose: 193 mg/dl COMPARISON:  CT chest, abdomen and pelvis 04/06/2019 FINDINGS: Mediastinal blood pool activity: SUV max 3.06. Liver activity: SUV max NA NECK: No hypermetabolic lymph nodes in the neck. Incidental CT findings: none CHEST: No hypermetabolic mediastinal or hilar nodes. No suspicious pulmonary nodules on the CT scan. No FDG avid esophageal mass identified. Incidental CT findings: Small hiatal hernia. Aortic atherosclerosis. Lad coronary artery calcifications. ABDOMEN/PELVIS: No abnormal hypermetabolic activity within the liver, pancreas, adrenal glands, or spleen. No hypermetabolic lymph nodes in the abdomen or pelvis.  Incidental CT findings: Aortic atherosclerosis. Left kidney cortical scarring. SKELETON: No focal hypermetabolic activity to suggest skeletal metastasis. Incidental CT findings: Right hip arthroplasty. Postoperative change within the lumbar spine from previous posterior decompression noted. Multi level degenerative disc disease identified throughout the lumbar spine. IMPRESSION: 1. No abnormal increased radiotracer uptake to suggest FDG avid tumor or metastatic disease. 2.  Aortic Atherosclerosis (ICD10-I70.0). 3. Coronary artery calcification. 4. Small hiatal hernia. Electronically Signed   By: Kerby Moors M.D.   On: 04/13/2019 12:25     Assessment/Plan HLD (hyperlipidemia) lipid control important in reducing the progression of atherosclerotic disease. Continue statin therapy   Essential (primary) hypertension blood pressure control important in reducing the progression of atherosclerotic disease. On appropriate oral medications.  Diabetes mellitus ([place]) blood glucose control important in reducing the progression of atherosclerotic disease. Also, involved in wound healing. On appropriate medications.  Carotid stenosis Carotid duplex today with little change on the lower end of 40 to 59% on the right and in the 60 to 79% on the left.  No role for surgery at this time.  Continue aspirin and statin agent.  Recheck in 6 months.    Leotis Pain, MD  04/26/2019 3:10 PM    This note was created with Dragon medical transcription system.  Any errors from dictation are purely unintentional

## 2019-05-04 ENCOUNTER — Encounter: Payer: Self-pay | Admitting: Oncology

## 2019-05-04 DIAGNOSIS — Z7189 Other specified counseling: Secondary | ICD-10-CM | POA: Insufficient documentation

## 2019-05-12 ENCOUNTER — Ambulatory Visit: Payer: Medicare Other | Attending: Internal Medicine

## 2019-05-12 DIAGNOSIS — Z23 Encounter for immunization: Secondary | ICD-10-CM | POA: Insufficient documentation

## 2019-05-12 NOTE — Progress Notes (Signed)
   Covid-19 Vaccination Clinic  Name:  Blake [name]    MRN: [id number] DOB: [birth year]  05/12/2019  Mr. [name] was observed post Covid-19 immunization for 15 minutes without incidence. He was provided with Vaccine Information Sheet and instruction to access the V-Safe system.   [name] was instructed to call 911 with any severe reactions post vaccine: [name] Kitchen Difficulty breathing  . Swelling of your face and throat  . A fast heartbeat  . A bad rash all over your body  . Dizziness and weakness    Immunizations Administered    Name Date Dose VIS Date Route   Pfizer COVID-19 Vaccine 05/12/2019  4:04 PM 0.3 mL 03/11/2019 Intramuscular   Manufacturer: [place]   Lot: [id number]   [place]: [id number]

## 2019-05-13 ENCOUNTER — Telehealth: Payer: Self-pay

## 2019-05-13 NOTE — Telephone Encounter (Signed)
Mr. Addicks has seen Dr. Egbert Garibaldi Given the findings of multifocal cancer, the most immediate issue is identifying whether this is local disease that could be cured endoscopically or not. With plan for EMR, there is an understanding that the results of this will be both therapeutic but importantly also diagnostic as evidence of involvement beyond the muscularis mucosae (T1b lesion) would require referral to thoracic surgery for resection given the >10% risk for LN metastases in that context. Currently scheduled for 06-09-19.

## 2019-05-20 ENCOUNTER — Telehealth: Payer: Self-pay

## 2019-05-20 NOTE — Telephone Encounter (Signed)
Call placed to Blake [name] for follow up. No answer. Line disconnected.

## 2019-05-25 ENCOUNTER — Other Ambulatory Visit: Payer: Self-pay | Admitting: Internal Medicine

## 2019-06-10 ENCOUNTER — Telehealth: Payer: Self-pay

## 2019-06-10 NOTE — Telephone Encounter (Signed)
Call placed to Blake [name] to follow up on his EMR procedure from 06/09/19. He reports he is doing well post EMR. Pathology is pending. We will arrange follow up with Dr. Tasia Catchings once pathology has resulted.

## 2019-06-20 NOTE — Telephone Encounter (Signed)
He had EMR, any update for his plan?

## 2019-06-21 ENCOUNTER — Telehealth: Payer: Self-pay

## 2019-06-21 NOTE — Telephone Encounter (Signed)
Mr. Ebron returned call. He has not received his pathology results and does not have GI follow up arranged. Briefly went over pathology and arranged follow up with Dr. Tasia Catchings. I have sent Dr. Egbert Garibaldi a message regarding follow up. We can also assist with transition back to his local GI as needed.  Pathology - General / Other: [id number] Order: [id number] Collected: 06/09/2019 15:32 Status: Final result   Visible to patient: No (not released) Dx: Esophageal cancer (CMS-HCC); Barrett'... Component   DIAGNOSIS  A.  Gastric polyps, endoscopic biopsy:   Fundic gland polyp, one fragment, negative for dysplasia. Separate fragments of hyperplastic/inflammatory polyp. Negative for dysplasia and malignancy.      B.  Stomach, endoscopic biopsy:   Gastric antral and oxyntic mucosa with no significant pathologic diagnosis. Negative for evidence of Helicobacter pylori on routine H&E.      C.  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   Comment:  This EMR specimen was received in multiple fragments and margins cannot be evaluated.  This case was reviewed at GI Pathologists' consensus conference on 06-16-2019. Electronically signed by Selena Batten, MD on 06/16/2019 at [facility]  Barrett's esophagus with high grade dysplasia Esophageal cancer   Upper endoscopy impression:  Esophageal mucosal changes, classified as Barrett's stage C4-M4 per prague criteria.  A 4 cm hiatal hernia.  A few gastric polyps, biopsied.  Gastropathic appearance mucosa in the stomach.  Normal examined duodenum.  Mucosal resection was performed, resection and retrieval were complete.  Biopsies were obtained on the greater curvature of the gastric body, lesser curvature of the gastric body, at the incisura, on the greater curvature of the gastric antrum and lesser curvature of the gastric antrum. Gross Examination  A. "Gastric polyp biopsies",  received in formalin on filter paper are four fragments of pink-tan tissue up to 0.2 cm, submitted in total in A1 for special processing.   B. "Gastric biopsies r/o HP", received in formalin on filter paper are five fragments of pink-tan tissue up to 0.4 cm, submitted in total in B1 for special processing.   C. "Esophageal mass EMR", received in formalin on filter paper are multiple fragments of tan soft tissue, up to 0.6 cm in greatest dimension.  The presumed resection margin is inked green on the largest fragment.  Smaller fragments are submitted in block C1; the largest fragment is bisected and submitted in block C2.    M. Bovender/Dr. Lanora Manis  Microscopic Examination  Microscopic examination is performed. Disclaimer  All immunohistochemistry, in situ hybridization tests and special stains performed at [facility] and reported herein were developed, validated and their performance characteristics determined by the [facility]. During the performance of these tests, appropriate positive and negative control slides are also performed and reviewed. All control slides and internal controls (when applicable) demonstrate the expected immunoreactive patterns and/or nucleic acid hybridization.  These ancillary studies were ordered following review of the H&E and clinical history, except when part of a liver/kidney protocol or deemed necessary following review of the clinical history (e.g. immunocompromised, critically ill, history of malignancy). Some of the tests may not be cleared or approved by the U.S. Food and Drug Administration (FDA).  The FDA has determined that such clearance or approval is not necessary.  These tests are used for clinical purposes and should not be regarded as investigational or as research.  This laboratory is certified under the [place] (CLIA)  as qualified to perform high complexity clinical  testing. Attestation  All of the diagnostic evaluations on the enumerated specimens have been personally conducted by the pathologists involved in the care of this patient as indicated by the electronic signatures above. Resulting Agency  Specimen Collected: 06/09/19 15:32 Last Resulted: 06/16/19 15:56

## 2019-06-21 NOTE — Telephone Encounter (Signed)
Voicemail left with Blake [name] to return call. Pathology from EMR resulted 3/18. Needs follow up arranged with Dr. Tasia Catchings. No follow up noted with GI. We can help arrange as needed. Will await callback from Blake [name].

## 2019-06-24 ENCOUNTER — Inpatient Hospital Stay: Payer: Medicare Other | Attending: Oncology | Admitting: Oncology

## 2019-06-24 ENCOUNTER — Encounter: Payer: Self-pay | Admitting: Oncology

## 2019-06-24 VITALS — BP 141/64 | HR 86 | Temp 96.7°F | Resp 18 | Wt 189.3 lb

## 2019-06-24 DIAGNOSIS — C155 Malignant neoplasm of lower third of esophagus: Secondary | ICD-10-CM | POA: Insufficient documentation

## 2019-06-24 DIAGNOSIS — Z7984 Long term (current) use of oral hypoglycemic drugs: Secondary | ICD-10-CM | POA: Diagnosis not present

## 2019-06-24 DIAGNOSIS — Z801 Family history of malignant neoplasm of trachea, bronchus and lung: Secondary | ICD-10-CM | POA: Insufficient documentation

## 2019-06-24 DIAGNOSIS — Z9221 Personal history of antineoplastic chemotherapy: Secondary | ICD-10-CM | POA: Diagnosis not present

## 2019-06-24 DIAGNOSIS — Z87891 Personal history of nicotine dependence: Secondary | ICD-10-CM | POA: Insufficient documentation

## 2019-06-24 DIAGNOSIS — Z923 Personal history of irradiation: Secondary | ICD-10-CM | POA: Insufficient documentation

## 2019-06-24 DIAGNOSIS — I2510 Atherosclerotic heart disease of native coronary artery without angina pectoris: Secondary | ICD-10-CM | POA: Diagnosis not present

## 2019-06-24 DIAGNOSIS — E785 Hyperlipidemia, unspecified: Secondary | ICD-10-CM | POA: Diagnosis not present

## 2019-06-24 DIAGNOSIS — I10 Essential (primary) hypertension: Secondary | ICD-10-CM | POA: Insufficient documentation

## 2019-06-24 DIAGNOSIS — Z833 Family history of diabetes mellitus: Secondary | ICD-10-CM | POA: Diagnosis not present

## 2019-06-24 DIAGNOSIS — Z7982 Long term (current) use of aspirin: Secondary | ICD-10-CM | POA: Insufficient documentation

## 2019-06-24 DIAGNOSIS — Z85810 Personal history of malignant neoplasm of tongue: Secondary | ICD-10-CM | POA: Diagnosis not present

## 2019-06-24 DIAGNOSIS — Z79899 Other long term (current) drug therapy: Secondary | ICD-10-CM | POA: Insufficient documentation

## 2019-06-24 DIAGNOSIS — N400 Enlarged prostate without lower urinary tract symptoms: Secondary | ICD-10-CM | POA: Insufficient documentation

## 2019-06-24 DIAGNOSIS — C159 Malignant neoplasm of esophagus, unspecified: Secondary | ICD-10-CM | POA: Diagnosis not present

## 2019-06-24 NOTE — Progress Notes (Signed)
Hematology/Oncology Consult note [facility] Telephone:([phone] [phone]   Patient Care Team: Idelle Crouch, MD as PCP - General (Internal Medicine) Christene Lye, MD (General Surgery) Clent Jacks, RN as Oncology Nurse Navigator  REFERRING PROVIDER: Idelle Crouch, MD  CHIEF COMPLAINTS/REASON FOR VISIT:  Evaluation of esophageal cancer  HISTORY OF PRESENTING ILLNESS:   Blake [name] is a  74 y.o.  male with PMH listed below was seen in consultation at the request of  Idelle Crouch, MD  for evaluation of esophageal cancer Patient has longstanding Barrett's esophagus. 03/21/2019 patient underwent surveillance endoscopy which showed esophageal mucosal changes secondary to long segment Barrett's disease present in the lower third of the esophagus.  Maximal longitudinal extent of these mucosal changes were 7 cm in length.  Mucosa was biopsied with cold forceps for histology in 4 quadrants at the interval of 2 cm in the lower third of esophagus.  2 cm hiatal hernia, gastritis. 03/21/2019 colonoscopy showed diverticulosis in the sigmoid colon.  4 mm polyp in ascending colon was removed. 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  1 out of 4 esophagus biopsy showed Barrett's mucosa with erosion, indefinite for s. Colon polyp biopsy showed tubular adenoma. Patient was referred to cancer center for further evaluation and management.  04/06/2019 CT chest abdomen pelvis showed no evidence of metastatic disease in the chest abdomen or pelvis.  No esophageal mass lesion evident.  Small to moderate hiatal hernia with possible wall thickening proximal stomach.  3 mm left lower lobe pulmonary nodule attention on follow-up recommended to ensure stability.  Tiny hypodensities in the left kidney with attenuation too high to be simple cyst.  Patient was  accompanied by his wife today.  He denies any pain, unintentional weight loss, fever, chills, night sweats. He reports some oropharyngeal dysphagia after endoscopy, especially with dry food like bread.  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 Jeb Levering.  His initial PET scan 11/08/2004 showed abnormal localization in a right neck mass possibly a lymph node, slightly asymmetric localization at the base of the tongue.  Pathology is not available in current EMR. He has had PET scan done 08/02/2008   INTERVAL HISTORY Blake [name] is a 74 y.o. male who has above history reviewed by me today presents for follow up visit for management of esophageal cancer.  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 EMR specimen was received in multiple fragments and margins can not be evaluated.  1 gastric polyps were biopsied which showed fundic gland polyp. Negative for dysplasia Patient presents to discuss further management plan. He reports having some stomach pain after eating dinner for the past 2 nights.  Today pain is better.  Patient takes omeprazole 40 mg twice daily.  Review of Systems  Constitutional: Negative for appetite change, chills, fatigue, fever and unexpected weight change.  HENT:   Negative for hearing loss and voice change.   Eyes: Negative for eye problems and icterus.  Respiratory: Negative for chest tightness, cough and shortness of breath.   Cardiovascular: Negative for chest pain and leg swelling.  Gastrointestinal: Negative for abdominal distention and abdominal pain.       Stomach pain after dinner  Endocrine: Negative for hot flashes.  Genitourinary: Negative for difficulty urinating, dysuria and frequency.   Musculoskeletal: Negative for arthralgias.  Skin: Negative for itching and rash.    Neurological: Negative for light-headedness and numbness.  Hematological: Negative for adenopathy. Does not bruise/bleed easily.  Psychiatric/Behavioral: Negative for confusion.    MEDICAL HISTORY:  Past Medical History:  Diagnosis Date  . Arthritis   . Back pain    with leg pain  . 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 Coronary artery disease   . Dyspnea   . GERD (gastroesophageal reflux disease)   . Hyperlipidemia   . Hypertension   . Numbness and tingling of both lower extremities    with positioning  . Prostate enlargement     SURGICAL HISTORY: Past Surgical History:  Procedure Laterality Date  . COLONOSCOPY  12/08/2003  . COLONOSCOPY WITH PROPOFOL N/A 03/21/2019   Procedure: COLONOSCOPY WITH PROPOFOL;  Surgeon: Toledo, Benay Pike, MD;  Location: [facility] ENDOSCOPY;  Service: Gastroenterology;  Laterality: N/A;  . ESOPHAGOGASTRODUODENOSCOPY (EGD) WITH PROPOFOL N/A 03/21/2019   Procedure: ESOPHAGOGASTRODUODENOSCOPY (EGD) WITH PROPOFOL;  Surgeon: Toledo, Benay Pike, MD;  Location: [facility] ENDOSCOPY;  Service: Gastroenterology;  Laterality: N/A;  . esophogeal cancer    . JOINT REPLACEMENT Right    total hip  . LUMBAR LAMINECTOMY/DECOMPRESSION MICRODISCECTOMY N/A 03/03/2018   Procedure: LUMBAR LAMINECTOMY/DECOMPRESSION MICRODISCECTOMY 1 LEVEL-L3-4,L4-5;  Surgeon: Meade Maw, MD;  Location: [facility] ORS;  Service: Neurosurgery;  Laterality: N/A;  . PARTIAL HIP ARTHROPLASTY  2014  . PORT-A-CATH REMOVAL    . TONGUE BIOPSY  2006  . TONSILLECTOMY    . TRIGGER FINGER RELEASE Right 2013  . UPPER GI ENDOSCOPY  01/25/14   multiple gastric polyps    SOCIAL HISTORY: Social History   Socioeconomic History  . Marital status: Married    Spouse name: Not on file  . Number of children: Not on file  . Years of education: Not on file  . Highest education level: Not on file  Occupational History  . Not on file  Tobacco Use  .  Smoking status: Former Smoker    Packs/day: 1.00    Years: 30.00    Pack years: 30.00    Types: Cigarettes    Quit date: 02/18/1994    Years since quitting: 25.3  . Smokeless tobacco: Never Used  Substance and Sexual Activity  . Alcohol use: Yes  . Drug use: No  . Sexual activity: Yes  Other Topics Concern  . Not on file  Social History Narrative  . Not on file   Social Determinants of Health   Financial Resource Strain:   . Difficulty of Paying Living Expenses:   Food Insecurity:   . Worried About Charity fundraiser in the Last Year:   . Arboriculturist in the Last Year:   Transportation Needs:   . Film/video editor (Medical):   [name] Kitchen Lack of Transportation (Non-Medical):   Physical Activity:   . Days of Exercise per Week:   . Minutes of Exercise per Session:   Stress:   . Feeling of Stress :   Social Connections:   . Frequency of Communication with Friends and Family:   . Frequency of Social Gatherings with Friends and Family:   . Attends Religious Services:   . Active Member of Clubs or Organizations:   . Attends Archivist Meetings:   [name] Kitchen Marital Status:   Intimate Partner Violence:   . Fear of  Current or Ex-Partner:   . Emotionally Abused:   [name] Kitchen Physically Abused:   . Sexually Abused:     FAMILY HISTORY: Family History  Problem Relation Age of Onset  . Diabetes Mother   . Lung cancer Father     ALLERGIES:  is allergic to sulfa antibiotics.  MEDICATIONS:  Current Outpatient Medications  Medication Sig Dispense Refill  . aspirin EC 81 MG tablet Take 81 mg by mouth daily.    [name] Kitchen atorvastatin (LIPITOR) 80 MG tablet Take 80 mg by mouth daily.   2  . Blood Glucose Monitoring Suppl (ONE TOUCH ULTRA SYSTEM KIT) w/Device KIT     . glimepiride (AMARYL) 4 MG tablet Take 4 mg by mouth daily with breakfast.    . ibuprofen (ADVIL) 800 MG tablet Take 800 mg by mouth every 8 (eight) hours as needed.    [name] Kitchen lisinopril (PRINIVIL,ZESTRIL) 10 MG tablet Take 10 mg  by mouth 2 (two) times daily.     . methocarbamol (ROBAXIN) 500 MG tablet Take 1 tablet (500 mg total) by mouth every 6 (six) hours as needed for muscle spasms. 90 tablet 0  . metoprolol succinate (TOPROL-XL) 50 MG 24 hr tablet Take 50 mg by mouth daily.     . Omega-3 1000 MG CAPS Take by mouth.    [name] Kitchen omeprazole (PRILOSEC) 40 MG capsule Take 40 mg by mouth daily as needed (heartburn).   3  . tamsulosin (FLOMAX) 0.4 MG CAPS capsule Take 0.4 mg by mouth daily after supper.     . gabapentin (NEURONTIN) 300 MG capsule     . meloxicam (MOBIC) 15 MG tablet Take 15 mg by mouth daily.    [name] Kitchen oxyCODONE (OXY IR/ROXICODONE) 5 MG immediate release tablet Take 1 tablet (5 mg total) by mouth every 4 (four) hours as needed for moderate pain ((score 4 to 6)). (Patient not taking: Reported on 04/11/2019) 30 tablet 0  . polyethylene glycol powder (GLYCOLAX/MIRALAX) powder 255 grams one bottle for colonoscopy prep (Patient not taking: Reported on 04/26/2019) 255 g 0   No current facility-administered medications for this visit.     PHYSICAL EXAMINATION: ECOG PERFORMANCE STATUS: 1 - Symptomatic but completely ambulatory Vitals:   06/24/19 1409  BP: (!) 141/64  Pulse: 86  Resp: 18  Temp: (!) 96.7 F (35.9 C)   Filed Weights   06/24/19 1409  Weight: 189 lb 4.8 oz (85.9 kg)    Physical Exam Constitutional:      General: He is not in acute distress. HENT:     Head: Normocephalic and atraumatic.  Eyes:     General: No scleral icterus.    Pupils: Pupils are equal, round, and reactive to light.  Cardiovascular:     Rate and Rhythm: Normal rate and regular rhythm.     Heart sounds: Normal heart sounds.  Pulmonary:     Effort: Pulmonary effort is normal. No respiratory distress.     Breath sounds: No wheezing.  Abdominal:     General: Bowel sounds are normal. There is no distension.     Palpations: Abdomen is soft. There is no mass.     Tenderness: There is no abdominal tenderness.  Musculoskeletal:         General: No deformity. Normal range of motion.     Cervical back: Normal range of motion and neck supple.  Skin:    General: Skin is warm and dry.     Findings: No erythema or rash.  Neurological:  Mental Status: He is alert and oriented to person, place, and time. Mental status is at baseline.     Cranial Nerves: No cranial nerve deficit.     Coordination: Coordination normal.  Psychiatric:        Mood and Affect: Mood normal.        Behavior: Behavior normal.        Thought Content: Thought content normal.     LABORATORY DATA:  I have reviewed the data as listed Lab Results  Component Value Date   WBC 6.6 04/11/2019   HGB 15.3 04/11/2019   HCT 48.8 04/11/2019   MCV 92.1 04/11/2019   PLT 219 04/11/2019   Recent Labs    04/06/19 1150 04/11/19 1230  NA  --  138  K  --  4.5  CL  --  103  CO2  --  25  GLUCOSE  --  199*  BUN  --  18  CREATININE 1.13 1.04  CALCIUM  --  8.8*  GFRNONAA >60 >60  GFRAA >60 >60  PROT  --  7.2  ALBUMIN  --  4.2  AST  --  23  ALT  --  37  ALKPHOS  --  91  BILITOT  --  0.6   Iron/TIBC/Ferritin/ %Sat No results found for: IRON, TIBC, FERRITIN, IRONPCTSAT    RADIOGRAPHIC STUDIES: I have personally reviewed the radiological images as listed and agreed with the findings in the report. CT CHEST W CONTRAST  Result Date: 04/06/2019 CLINICAL DATA:  Esophageal cancer. Staging. EXAM: CT CHEST, ABDOMEN, AND PELVIS WITH CONTRAST TECHNIQUE: Multidetector CT imaging of the chest, abdomen and pelvis was performed following the standard protocol during bolus administration of intravenous contrast. CONTRAST:  154m OMNIPAQUE IOHEXOL 300 MG/ML  SOLN COMPARISON:  None. PET-CT 08/02/2008 FINDINGS: CT CHEST FINDINGS Cardiovascular: The heart size is normal. No substantial pericardial effusion. Coronary artery calcification is evident. Atherosclerotic calcification is noted in the wall of the thoracic aorta. Mediastinum/Nodes: No mediastinal  lymphadenopathy. There is no hilar lymphadenopathy. No discernible esophageal mass in this patient with reported history of esophageal cancer. Small to moderate hiatal hernia noted. There is no axillary lymphadenopathy. Lungs/Pleura: 3 mm left lower lobe nodule identified on 126/4. No focal airspace consolidation. No pleural effusion. Musculoskeletal: No worrisome lytic or sclerotic osseous abnormality. Degenerative changes noted in the shoulders bilaterally. CT ABDOMEN PELVIS FINDINGS Hepatobiliary: No suspicious focal abnormality within the liver parenchyma. There is no evidence for gallstones, gallbladder wall thickening, or pericholecystic fluid. No intrahepatic or extrahepatic biliary dilation. Pancreas: No focal mass lesion. No dilatation of the main duct. No intraparenchymal cyst. No peripancreatic edema. Spleen: No splenomegaly. No focal mass lesion. Adrenals/Urinary Tract: No adrenal nodule or mass. Cortical scarring noted in both kidneys without overtly suspicious renal mass lesion. Tiny hypoattenuating lesions in the left kidney are too small to characterize although attenuation is higher than would be expected for simple cysts. No evidence for hydroureter. The urinary bladder appears normal for the degree of distention. Stomach/Bowel: Small to moderate hiatal hernia with possible wall thickening proximal stomach. Duodenum is normally positioned as is the ligament of Treitz. No small bowel wall thickening. No small bowel dilatation. The terminal ileum is normal. The appendix is normal. No gross colonic mass. No colonic wall thickening. Vascular/Lymphatic: There is abdominal aortic atherosclerosis without aneurysm. There is no gastrohepatic or hepatoduodenal ligament lymphadenopathy. No retroperitoneal or mesenteric lymphadenopathy. No pelvic sidewall lymphadenopathy. Reproductive: Prostate gland largely obscured by beam hardening artifact from right hip  replacement. Other: No intraperitoneal free fluid.  Musculoskeletal: Status post right hip replacement 9 mm sclerotic lesion left iliac bone is stable since prior PET-CT consistent with benign etiology. No worrisome lytic or sclerotic osseous abnormality. IMPRESSION: 1. No evidence for metastatic disease in the chest, abdomen, or pelvis. 2. No esophageal mass lesion evident this patient with a reported history of esophageal cancer. Small to moderate hiatal hernia with possible wall thickening proximal stomach. 3. 3 mm left lower lobe pulmonary nodule. Attention on follow-up recommended to ensure stability. 4. Tiny hypodensities in the left kidney with attenuation too high to be simple cysts. These may well be cysts complicated by proteinaceous debris or hemorrhage. MRI abdomen with and without contrast could be used to confirm. 5. Aortic Atherosclerosis (ICD10-I70.0). Electronically Signed   By: Misty Stanley M.D.   On: 04/06/2019 12:56   CT ABDOMEN PELVIS W CONTRAST  Result Date: 04/06/2019 CLINICAL DATA:  Esophageal cancer. Staging. EXAM: CT CHEST, ABDOMEN, AND PELVIS WITH CONTRAST TECHNIQUE: Multidetector CT imaging of the chest, abdomen and pelvis was performed following the standard protocol during bolus administration of intravenous contrast. CONTRAST:  134m OMNIPAQUE IOHEXOL 300 MG/ML  SOLN COMPARISON:  None. PET-CT 08/02/2008 FINDINGS: CT CHEST FINDINGS Cardiovascular: The heart size is normal. No substantial pericardial effusion. Coronary artery calcification is evident. Atherosclerotic calcification is noted in the wall of the thoracic aorta. Mediastinum/Nodes: No mediastinal lymphadenopathy. There is no hilar lymphadenopathy. No discernible esophageal mass in this patient with reported history of esophageal cancer. Small to moderate hiatal hernia noted. There is no axillary lymphadenopathy. Lungs/Pleura: 3 mm left lower lobe nodule identified on 126/4. No focal airspace consolidation. No pleural effusion. Musculoskeletal: No worrisome lytic or  sclerotic osseous abnormality. Degenerative changes noted in the shoulders bilaterally. CT ABDOMEN PELVIS FINDINGS Hepatobiliary: No suspicious focal abnormality within the liver parenchyma. There is no evidence for gallstones, gallbladder wall thickening, or pericholecystic fluid. No intrahepatic or extrahepatic biliary dilation. Pancreas: No focal mass lesion. No dilatation of the main duct. No intraparenchymal cyst. No peripancreatic edema. Spleen: No splenomegaly. No focal mass lesion. Adrenals/Urinary Tract: No adrenal nodule or mass. Cortical scarring noted in both kidneys without overtly suspicious renal mass lesion. Tiny hypoattenuating lesions in the left kidney are too small to characterize although attenuation is higher than would be expected for simple cysts. No evidence for hydroureter. The urinary bladder appears normal for the degree of distention. Stomach/Bowel: Small to moderate hiatal hernia with possible wall thickening proximal stomach. Duodenum is normally positioned as is the ligament of Treitz. No small bowel wall thickening. No small bowel dilatation. The terminal ileum is normal. The appendix is normal. No gross colonic mass. No colonic wall thickening. Vascular/Lymphatic: There is abdominal aortic atherosclerosis without aneurysm. There is no gastrohepatic or hepatoduodenal ligament lymphadenopathy. No retroperitoneal or mesenteric lymphadenopathy. No pelvic sidewall lymphadenopathy. Reproductive: Prostate gland largely obscured by beam hardening artifact from right hip replacement. Other: No intraperitoneal free fluid. Musculoskeletal: Status post right hip replacement 9 mm sclerotic lesion left iliac bone is stable since prior PET-CT consistent with benign etiology. No worrisome lytic or sclerotic osseous abnormality. IMPRESSION: 1. No evidence for metastatic disease in the chest, abdomen, or pelvis. 2. No esophageal mass lesion evident this patient with a reported history of esophageal  cancer. Small to moderate hiatal hernia with possible wall thickening proximal stomach. 3. 3 mm left lower lobe pulmonary nodule. Attention on follow-up recommended to ensure stability. 4. Tiny hypodensities in the left kidney with attenuation too high to  be simple cysts. These may well be cysts complicated by proteinaceous debris or hemorrhage. MRI abdomen with and without contrast could be used to confirm. 5. Aortic Atherosclerosis (ICD10-I70.0). Electronically Signed   By: Misty Stanley M.D.   On: 04/06/2019 12:56   NM PET Image Initial (PI) Skull Base To Thigh  Result Date: 04/13/2019 CLINICAL DATA:  Initial treatment strategy for esophageal carcinoma. EXAM: NUCLEAR MEDICINE PET SKULL BASE TO THIGH TECHNIQUE: 9.5 mCi F-18 FDG was injected intravenously. Full-ring PET imaging was performed from the skull base to thigh after the radiotracer. CT data was obtained and used for attenuation correction and anatomic localization. Fasting blood glucose: 193 mg/dl COMPARISON:  CT chest, abdomen and pelvis 04/06/2019 FINDINGS: Mediastinal blood pool activity: SUV max 3.06. Liver activity: SUV max NA NECK: No hypermetabolic lymph nodes in the neck. Incidental CT findings: none CHEST: No hypermetabolic mediastinal or hilar nodes. No suspicious pulmonary nodules on the CT scan. No FDG avid esophageal mass identified. Incidental CT findings: Small hiatal hernia. Aortic atherosclerosis. Lad coronary artery calcifications. ABDOMEN/PELVIS: No abnormal hypermetabolic activity within the liver, pancreas, adrenal glands, or spleen. No hypermetabolic lymph nodes in the abdomen or pelvis. Incidental CT findings: Aortic atherosclerosis. Left kidney cortical scarring. SKELETON: No focal hypermetabolic activity to suggest skeletal metastasis. Incidental CT findings: Right hip arthroplasty. Postoperative change within the lumbar spine from previous posterior decompression noted. Multi level degenerative disc disease identified  throughout the lumbar spine. IMPRESSION: 1. No abnormal increased radiotracer uptake to suggest FDG avid tumor or metastatic disease. 2.  Aortic Atherosclerosis (ICD10-I70.0). 3. Coronary artery calcification. 4. Small hiatal hernia. Electronically Signed   By: Kerby Moors M.D.   On: 04/13/2019 12:25   VAS US CAROTID  Result Date: 04/26/2019 Carotid Arterial Duplex Study Risk Factors:      Hyperlipidemia. Other Factors:     Carotid stenosis. Comparison Study:  10/19/2018 Performing Technologist: Charlane Ferretti RT (R)(VS)  Examination Guidelines: A complete evaluation includes B-mode imaging, spectral Doppler, color Doppler, and power Doppler as needed of all accessible portions of each vessel. Bilateral testing is considered an integral part of a complete examination. Limited examinations for reoccurring indications may be performed as noted.  Right Carotid Findings: +----------+--------+--------+--------+------------------+--------------------+           PSV cm/sEDV cm/sStenosisPlaque DescriptionComments             +----------+--------+--------+--------+------------------+--------------------+ CCA Prox  146     26                                tortuous             +----------+--------+--------+--------+------------------+--------------------+ CCA Mid   120     25                                tortuous             +----------+--------+--------+--------+------------------+--------------------+ CCA [id number]     30              calcific                               +----------+--------+--------+--------+------------------+--------------------+ ICA Prox  112     36              heterogenous      ICA/CCA ratio = 1.42 +----------+--------+--------+--------+------------------+--------------------+  ICA Mid   162     48                                                     +----------+--------+--------+--------+------------------+--------------------+ ICA Distal170      47                                                     +----------+--------+--------+--------+------------------+--------------------+ ECA       250     37                                                     +----------+--------+--------+--------+------------------+--------------------+ +----------+--------+-------+--------+-------------------+           PSV cm/sEDV cmsDescribeArm Pressure (mmHG) +----------+--------+-------+--------+-------------------+ [id number]                                        +----------+--------+-------+--------+-------------------+ +---------+--------+--+--------+--+ VertebralPSV cm/s47EDV cm/s13 +---------+--------+--+--------+--+ Left Carotid Findings: +----------+--------+--------+--------+------------------+--------------------+           PSV cm/sEDV cm/sStenosisPlaque DescriptionComments             +----------+--------+--------+--------+------------------+--------------------+ CCA Prox  223     34                                tortuous             +----------+--------+--------+--------+------------------+--------------------+ CCA Mid   200     29                                tortuous             +----------+--------+--------+--------+------------------+--------------------+ CCA [id number]     32              calcific                               +----------+--------+--------+--------+------------------+--------------------+ ICA Prox  235     70              calcific          ICA/CCA ratio = 1.20 +----------+--------+--------+--------+------------------+--------------------+ ICA Mid   115     36                                                     +----------+--------+--------+--------+------------------+--------------------+ ICA Distal88      31                                                     +----------+--------+--------+--------+------------------+--------------------+  ECA       328     53                                                      +----------+--------+--------+--------+------------------+--------------------+ +----------+--------+--------+--------+-------------------+           PSV cm/sEDV cm/sDescribeArm Pressure (mmHG) +----------+--------+--------+--------+-------------------+ [id number]                                         +----------+--------+--------+--------+-------------------+ +---------+--------+--+--------+-+ VertebralPSV cm/s47EDV cm/s8 +---------+--------+--+--------+-+ Summary: Right Carotid: Velocities in the right ICA are consistent with a 40-59%                stenosis. The ECA appears >50% stenosed. Left Carotid: Velocities in the left ICA are consistent with a 60-79% stenosis.               The ECA appears >50% stenosed. Vertebrals:  Bilateral vertebral arteries demonstrate antegrade flow. Subclavians: Bilateral subclavian artery flow was disturbed. *See table(s) above for measurements and observations.  Electronically signed by Leotis Pain MD on 04/26/2019 at 3:21:45 PM.    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   At least intramucosal disease, T1, margin status is unclear.  Unclear if he has T1a or T1b disease.  Endoscopic resection is a reasonable option for T1a disease, For T1b disease, esophagectomy is the treatment of choice for fit patients,  as T1b has a high risk of lymph node metastatic disease.  There is uncertainty due to the unknown margin status, and T1a vs T1b pathology,  I recommend patient to establish care with thoracic surgeon, discussion of the pros and cons of esophagectomy, long term impact on health related quality of life, to balance risk of nodal metastasis and procedure risk.  Recommend his case to be discussed at [place] tumor board.  All questions were answered. The patient knows to call the clinic with any  problems questions or concerns.  cc Idelle Crouch, MD    Return of visit: to be determined.   We spent sufficient time to discuss many aspect of care, questions were answered to patient's satisfaction. A total of 30 minutes was spent on this visit.  With 10 minutes spent reviewing  pathology reports form [facility], 15 minutes counseling the patient management plan and recommendation.  Additional 5 minutes was spent on answering patient's questions.   Earlie Server, MD, PhD Hematology Oncology [facility] at [facility] Pager- [phone] 06/24/2019

## 2019-06-24 NOTE — Progress Notes (Signed)
For the past 2 nights patient has been having stomach pain after eating dinner and the pain will last all night.

## 2019-06-29 ENCOUNTER — Telehealth: Payer: Self-pay

## 2019-06-29 NOTE — Telephone Encounter (Signed)
error 

## 2019-06-29 NOTE — Telephone Encounter (Signed)
Called and spoke with Mrs. Merisier this am regarding plan of care. They received referral from Dr. Egbert Garibaldi to Dr. Rhae Hammock to consult regarding surgery. I will follow and ensure he is scheduled in a timely manner.

## 2019-07-14 DIAGNOSIS — C155 Malignant neoplasm of lower third of esophagus: Secondary | ICD-10-CM | POA: Insufficient documentation

## 2019-08-12 ENCOUNTER — Telehealth: Payer: Self-pay

## 2019-08-12 NOTE — Telephone Encounter (Signed)
Esophageal biopsies performed 08/08/19. Awaiting televist with Dr. Elenor Quinones on 08-16-19 for  additional treatment options.  DIAGNOSIS A.  Esophagus, 30 cm, endoscopic biopsy:   Residual invasive adenocarcinoma, at least invasive to muscularis mucosa.   B.  Esophagus, 30 cm #2, endoscopic biopsy:   Superficial fragments of highly dysplastic epithelium, focally suspicious for intramucosal carcinoma.     Reviewed by resident/fellow Alyson Ingles, DAVID Electronically signed by Lorenso Courier, MD on 08/10/2019 at 1024

## 2019-08-17 ENCOUNTER — Other Ambulatory Visit: Payer: Self-pay | Admitting: Internal Medicine

## 2019-08-17 ENCOUNTER — Telehealth: Payer: Self-pay

## 2019-08-17 NOTE — Telephone Encounter (Signed)
Blake [name] has been scheduled for [facility] Esophagogastrectomy Jejunostomy on 09/12/19 with Dr. Elenor Quinones. Will continue to follow.

## 2019-09-13 DIAGNOSIS — G8918 Other acute postprocedural pain: Secondary | ICD-10-CM | POA: Insufficient documentation

## 2019-09-19 ENCOUNTER — Ambulatory Visit: Payer: Medicare Other | Admitting: Dermatology

## 2019-10-23 ENCOUNTER — Other Ambulatory Visit: Payer: Self-pay

## 2019-10-23 ENCOUNTER — Emergency Department
Admission: EM | Admit: 2019-10-23 | Discharge: 2019-10-23 | Disposition: A | Discharge status: Home / Self Care | Payer: Medicare Other | Attending: Emergency Medicine | Admitting: Emergency Medicine

## 2019-10-23 DIAGNOSIS — Z5321 Procedure and treatment not carried out due to patient leaving prior to being seen by health care provider: Secondary | ICD-10-CM | POA: Diagnosis not present

## 2019-10-23 DIAGNOSIS — K9423 Gastrostomy malfunction: Secondary | ICD-10-CM | POA: Insufficient documentation

## 2019-10-23 NOTE — ED Triage Notes (Signed)
Patient reports having feeding tube placed at [facility] on 6/13. Patient reports while his wife was changing his bandage at home the stitches came out and the feeding tube was coming out. Tube currently held in place with tape. Patient c/o pain to site.

## 2019-10-23 NOTE — ED Notes (Signed)
Patient received a call from his wife immediately post triage. Per patient's wife, the surgeon from [place] returned his call and said not to go to the hospital. Patient was instead to tape the tube in place and see his surgeon at his office the next morning at 10 am. The tube had already been taped in place by EDT. Patient informed this RN he was leaving. Patient left.

## 2019-10-24 ENCOUNTER — Other Ambulatory Visit: Payer: Self-pay | Admitting: Internal Medicine

## 2019-10-25 ENCOUNTER — Ambulatory Visit (INDEPENDENT_AMBULATORY_CARE_PROVIDER_SITE_OTHER): Payer: Medicare Other | Admitting: Vascular Surgery

## 2019-10-25 ENCOUNTER — Encounter (INDEPENDENT_AMBULATORY_CARE_PROVIDER_SITE_OTHER): Payer: Medicare Other

## 2019-11-02 ENCOUNTER — Encounter: Payer: Self-pay | Admitting: Dermatology

## 2019-11-02 ENCOUNTER — Ambulatory Visit (INDEPENDENT_AMBULATORY_CARE_PROVIDER_SITE_OTHER): Payer: Medicare Other | Admitting: Dermatology

## 2019-11-02 ENCOUNTER — Other Ambulatory Visit: Payer: Self-pay

## 2019-11-02 DIAGNOSIS — I6523 Occlusion and stenosis of bilateral carotid arteries: Secondary | ICD-10-CM

## 2019-11-02 DIAGNOSIS — Z8501 Personal history of malignant neoplasm of esophagus: Secondary | ICD-10-CM

## 2019-11-02 DIAGNOSIS — L814 Other melanin hyperpigmentation: Secondary | ICD-10-CM

## 2019-11-02 DIAGNOSIS — D229 Melanocytic nevi, unspecified: Secondary | ICD-10-CM | POA: Diagnosis not present

## 2019-11-02 DIAGNOSIS — D485 Neoplasm of uncertain behavior of skin: Secondary | ICD-10-CM | POA: Diagnosis not present

## 2019-11-02 DIAGNOSIS — L03312 Cellulitis of back [any part except buttock]: Secondary | ICD-10-CM

## 2019-11-02 DIAGNOSIS — L821 Other seborrheic keratosis: Secondary | ICD-10-CM

## 2019-11-02 DIAGNOSIS — D492 Neoplasm of unspecified behavior of bone, soft tissue, and skin: Secondary | ICD-10-CM

## 2019-11-02 DIAGNOSIS — D1800 Hemangioma unspecified site: Secondary | ICD-10-CM

## 2019-11-02 DIAGNOSIS — L820 Inflamed seborrheic keratosis: Secondary | ICD-10-CM | POA: Diagnosis not present

## 2019-11-02 DIAGNOSIS — Z1283 Encounter for screening for malignant neoplasm of skin: Secondary | ICD-10-CM

## 2019-11-02 DIAGNOSIS — L578 Other skin changes due to chronic exposure to nonionizing radiation: Secondary | ICD-10-CM

## 2019-11-02 DIAGNOSIS — L570 Actinic keratosis: Secondary | ICD-10-CM | POA: Diagnosis not present

## 2019-11-02 DIAGNOSIS — Z85828 Personal history of other malignant neoplasm of skin: Secondary | ICD-10-CM | POA: Diagnosis not present

## 2019-11-02 MED ORDER — MUPIROCIN 2 % EX OINT: 1.0000 "application " | TOPICAL_OINTMENT | Freq: Every day | CUTANEOUS | 1 refills | Status: DC

## 2019-11-02 NOTE — Progress Notes (Signed)
Follow-Up Visit   Subjective  Blake [name] is a 74 y.o. male who presents for the following: check spot (L face, ~42m, hx bleeding), check spot (L sideburn), and Annual Exam (Total body skin exam). The patient presents for Total-Body Skin Exam (TBSE) for skin cancer screening and mole check. He had major surgery for esophageal cancer and was hospitalized for 1 month.  He would like the drain spot on his back checked as it has been red.   The following portions of the chart were reviewed this encounter and updated as appropriate:  Tobacco  Allergies  Meds  Problems  Med Hx  Surg Hx  Fam Hx     Review of Systems:  No other skin or systemic complaints except as noted in HPI or Assessment and Plan.  Objective  Well appearing patient in no apparent distress; mood and affect are within normal limits.  A full examination was performed including scalp, head, eyes, ears, nose, lips, neck, chest, axillae, abdomen, back, buttocks, bilateral upper extremities, bilateral lower extremities, hands, feet, fingers, toes, fingernails, and toenails. All findings within normal limits unless otherwise noted below.  Objective  Multiple: Well healed scars with no evidence of recurrence.   Objective  Right mid lat back: Inflammation at drain site, no drainage  Images    Objective  Left Ear x 1: Pink scaly macules   Objective  L sideburn x 1: Erythematous keratotic or waxy stuck-on papule or plaque.   Objective  L zygoma: 0.6cm crusted pap      Assessment & Plan    Lentigines - Scattered tan macules - Discussed due to sun exposure - Benign, observe - Call for any changes  Seborrheic Keratoses - Stuck-on, waxy, tan-brown papules and plaques  - Discussed benign etiology and prognosis. - Observe - Call for any changes  Melanocytic Nevi - Tan-brown and/or pink-flesh-colored symmetric macules and papules - Benign appearing on exam today - Observation - Call clinic for  new or changing moles - Recommend daily use of broad spectrum spf 30+ sunscreen to sun-exposed areas.   Hemangiomas - Red papules - Discussed benign nature - Observe - Call for any changes  Actinic Damage - diffuse scaly erythematous macules with underlying dyspigmentation - Recommend daily broad spectrum sunscreen SPF 30+ to sun-exposed areas, reapply every 2 hours as needed.  - Call for new or changing lesions.  Skin cancer screening performed today.  Hx of Esophageal Cancer with recent partial esophagectomy - Continue care at [facility]  History of basal cell carcinoma (BCC) Multiple  Clear. Observe for recurrence. Call clinic for new or changing lesions.  Recommend regular skin exams, daily broad-spectrum spf 30+ sunscreen use, and photoprotection.     Cellulitis of back at surgery drain site from recent esophageal cancer surgery for which he was in the hospital for about 1 month, recently discharged. Right mid lat back There is no drainage today.  The area is red and edematous.  This may represent contact dermatitis or irritant contact from the dressing. There is no evidence of infection  Start Mupirocin oint qd Cont Cephalexin as prescribed until finished  mupirocin ointment (BACTROBAN) 2 % - Right mid lat back  AK (actinic keratosis) Left Ear x 1  Destruction of lesion - Left Ear x 1 Complexity: simple   Destruction method: cryotherapy   Informed consent: discussed and consent obtained   Timeout:  patient name, date of birth, surgical site, and procedure verified Lesion destroyed using liquid nitrogen: Yes  Region frozen until ice ball extended beyond lesion: Yes   Outcome: patient tolerated procedure well with no complications   Post-procedure details: wound care instructions given    Inflamed seborrheic keratosis L sideburn x 1 Recheck in 6wks  Destruction of lesion - L sideburn x 1 Complexity: simple   Destruction method: cryotherapy   Informed consent:  discussed and consent obtained   Timeout:  patient name, date of birth, surgical site, and procedure verified Lesion destroyed using liquid nitrogen: Yes   Region frozen until ice ball extended beyond lesion: Yes   Outcome: patient tolerated procedure well with no complications   Post-procedure details: wound care instructions given    Neoplasm of skin L zygoma  Skin / nail biopsy Type of biopsy: tangential   Informed consent: discussed and consent obtained   Timeout: patient name, date of birth, surgical site, and procedure verified   Procedure prep:  Patient was prepped and draped in usual sterile fashion Prep type:  Isopropyl alcohol Anesthesia: the lesion was anesthetized in a standard fashion   Anesthetic:  1% lidocaine w/ epinephrine 1-100,000 buffered w/ 8.4% NaHCO3 Instrument used: flexible razor blade   Outcome: patient tolerated procedure well   Post-procedure details: sterile dressing applied and wound care instructions given   Dressing type: bandage and petrolatum    Specimen 1 - Surgical pathology Differential Diagnosis: D48.5 R/O BCC Check Margins: No 0.6cm crusted pap  Return in about 6 weeks (around 12/14/2019) for recheck ISK, AK.   Documentation: I have reviewed the above documentation for accuracy and completeness, and I agree with the above.  Sarina Ser, MD

## 2019-11-02 NOTE — Patient Instructions (Signed)

## 2019-11-08 ENCOUNTER — Ambulatory Visit (INDEPENDENT_AMBULATORY_CARE_PROVIDER_SITE_OTHER): Payer: Medicare Other | Admitting: Nurse Practitioner

## 2019-11-08 ENCOUNTER — Encounter (INDEPENDENT_AMBULATORY_CARE_PROVIDER_SITE_OTHER): Payer: Self-pay | Admitting: Nurse Practitioner

## 2019-11-08 ENCOUNTER — Ambulatory Visit (INDEPENDENT_AMBULATORY_CARE_PROVIDER_SITE_OTHER): Payer: Medicare Other

## 2019-11-08 ENCOUNTER — Other Ambulatory Visit: Payer: Self-pay

## 2019-11-08 VITALS — BP 94/60 | HR 68 | Resp 16 | Wt 162.4 lb

## 2019-11-08 DIAGNOSIS — I6523 Occlusion and stenosis of bilateral carotid arteries: Secondary | ICD-10-CM | POA: Diagnosis not present

## 2019-11-08 DIAGNOSIS — E785 Hyperlipidemia, unspecified: Secondary | ICD-10-CM | POA: Diagnosis not present

## 2019-11-08 DIAGNOSIS — I10 Essential (primary) hypertension: Secondary | ICD-10-CM | POA: Diagnosis not present

## 2019-11-10 ENCOUNTER — Telehealth: Payer: Self-pay

## 2019-11-10 NOTE — Telephone Encounter (Signed)
Patient informed of pathology. He prefers to wait until his next appt. (12/14/19) to discuss treatment options.

## 2019-11-10 NOTE — Telephone Encounter (Signed)
-----   Message from Ralene Bathe, MD sent at 11/09/2019 11:44 AM EDT ----- Skin , left zygoma BASAL CELL CARCINOMA, NODULAR PATTERN, ULCERATED  Cancer - BCC Schedule for discussion of treatment options May treat at that visit (if we decide to treat with [facility])

## 2019-11-11 ENCOUNTER — Encounter (INDEPENDENT_AMBULATORY_CARE_PROVIDER_SITE_OTHER): Payer: Self-pay | Admitting: Nurse Practitioner

## 2019-11-11 NOTE — Progress Notes (Signed)
Subjective:    Patient ID: Blake [name], male    DOB: February 25, 1946, 74 y.o.   MRN: [id number] Chief Complaint  Patient presents with  . Follow-up    ultrasound follow up    The patient is seen for follow up evaluation of carotid stenosis. The carotid stenosis followed by ultrasound.   Since the patient's last office visit he has undergone esophageal reconstruction due to esophageal cancer.  The patient spent a significant amount of time at [facility] however he is slowly recovering.  The patient denies amaurosis fugax. There is no recent history of TIA symptoms or focal motor deficits. There is no prior documented CVA.  The patient is taking enteric-coated aspirin 81 mg daily.  There is no history of migraine headaches. There is no history of seizures.  The patient has a history of coronary artery disease, no recent episodes of angina or shortness of breath. The patient denies PAD or claudication symptoms. There is a history of hyperlipidemia which is being treated with a statin.    Duplex ultrasound shows 40 to 59% stenosis in the right internal carotid artery with 1 to 39% stenosis of the left internal carotid artery.  Previous studies done on 04/26/2019 show a 40 to 59% stenosis in the right internal carotid artery with a 60 to 79% stenosis in the left internal carotid artery.   Review of Systems  Eyes: Negative for visual disturbance.  Respiratory: Negative for shortness of breath.   All other systems reviewed and are negative.      Objective:   Physical Exam Vitals reviewed.  HENT:     Head: Normocephalic.  Neck:     Vascular: No carotid bruit.  Cardiovascular:     Rate and Rhythm: Normal rate and regular rhythm.     Pulses: Normal pulses.     Heart sounds: Normal heart sounds.  Pulmonary:     Effort: Pulmonary effort is normal.     Breath sounds: Normal breath sounds.  Skin:    General: Skin is warm and dry.  Neurological:     Mental Status: He  is alert and oriented to person, place, and time.  Psychiatric:        Mood and Affect: Mood normal.        Behavior: Behavior normal.        Thought Content: Thought content normal.        Judgment: Judgment normal.     BP 94/60 (BP Location: Right Arm)   Pulse 68   Resp 16   Wt 162 lb 6.4 oz (73.7 kg)   BMI 27.02 kg/m   Past Medical History:  Diagnosis Date  . Arthritis   . Back pain    with leg pain  . Basal cell carcinoma 09/04/2009   right cheek 5.5 cm ant to earlobe, right ant nasal alar rim  . Basal cell carcinoma 07/31/2014   right mid brow  . Basal cell carcinoma 07/30/2016   right ant nasal alar rim at ant edge of BCC scar  . 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 Coronary artery disease   . Dyspnea   . GERD (gastroesophageal reflux disease)   . Hyperlipidemia   . Hypertension   . Numbness and tingling of both lower extremities    with positioning  . Prostate enlargement     Social History   Socioeconomic History  . Marital status: Married  Spouse name: Not on file  . Number of children: Not on file  . Years of education: Not on file  . Highest education level: Not on file  Occupational History  . Not on file  Tobacco Use  . Smoking status: Former Smoker    Packs/day: 1.00    Years: 30.00    Pack years: 30.00    Types: Cigarettes    Quit date: 02/18/1994    Years since quitting: 25.7  . Smokeless tobacco: Never Used  Vaping Use  . Vaping Use: Never used  Substance and Sexual Activity  . Alcohol use: Yes  . Drug use: No  . Sexual activity: Yes  Other Topics Concern  . Not on file  Social History Narrative  . Not on file   Social Determinants of Health   Financial Resource Strain:   . Difficulty of Paying Living Expenses:   Food Insecurity:   . Worried About Charity fundraiser in the Last Year:   . Arboriculturist in the Last Year:   Transportation Needs:   . Lexicographer (Medical):   [name] Kitchen Lack of Transportation (Non-Medical):   Physical Activity:   . Days of Exercise per Week:   . Minutes of Exercise per Session:   Stress:   . Feeling of Stress :   Social Connections:   . Frequency of Communication with Friends and Family:   . Frequency of Social Gatherings with Friends and Family:   . Attends Religious Services:   . Active Member of Clubs or Organizations:   . Attends Archivist Meetings:   [name] Kitchen Marital Status:   Intimate Partner Violence:   . Fear of Current or Ex-Partner:   . Emotionally Abused:   [name] Kitchen Physically Abused:   . Sexually Abused:     Past Surgical History:  Procedure Laterality Date  . ABDOMINAL SURGERY    . COLONOSCOPY  12/08/2003  . COLONOSCOPY WITH PROPOFOL N/A 03/21/2019   Procedure: COLONOSCOPY WITH PROPOFOL;  Surgeon: Toledo, Benay Pike, MD;  Location: [facility] ENDOSCOPY;  Service: Gastroenterology;  Laterality: N/A;  . ESOPHAGOGASTRODUODENOSCOPY (EGD) WITH PROPOFOL N/A 03/21/2019   Procedure: ESOPHAGOGASTRODUODENOSCOPY (EGD) WITH PROPOFOL;  Surgeon: Toledo, Benay Pike, MD;  Location: [facility] ENDOSCOPY;  Service: Gastroenterology;  Laterality: N/A;  . esophogeal cancer    . JOINT REPLACEMENT Right    total hip  . LUMBAR LAMINECTOMY/DECOMPRESSION MICRODISCECTOMY N/A 03/03/2018   Procedure: LUMBAR LAMINECTOMY/DECOMPRESSION MICRODISCECTOMY 1 LEVEL-L3-4,L4-5;  Surgeon: Meade Maw, MD;  Location: [facility] ORS;  Service: Neurosurgery;  Laterality: N/A;  . PARTIAL HIP ARTHROPLASTY  2014  . PORT-A-CATH REMOVAL    . TONGUE BIOPSY  2006  . TONSILLECTOMY    . TRIGGER FINGER RELEASE Right 2013  . UPPER GI ENDOSCOPY  01/25/14   multiple gastric polyps    Family History  Problem Relation Age of Onset  . Diabetes Mother   . Lung cancer Father     Allergies  Allergen Reactions  . Sulfa Antibiotics Rash       Assessment & Plan:   1. Bilateral carotid artery stenosis Recommend:  Given the patient's asymptomatic  subcritical stenosis no further invasive testing or surgery at this time.  Duplex ultrasound shows 40 to 59% stenosis in the right internal carotid artery with 1 to 39% stenosis of the left internal carotid artery.    Continue antiplatelet therapy as prescribed Continue management of CAD, HTN and Hyperlipidemia Healthy heart diet,  encouraged exercise at least 4 times per  week Follow up in 6 months with duplex ultrasound and physical exam, due to significant discrepancies between studies. - VAS US CAROTID; Future  2. Essential (primary) hypertension Continue antihypertensive medications as already ordered, these medications have been reviewed and there are no changes at this time.   3. Hyperlipidemia, unspecified hyperlipidemia type Continue statin as ordered and reviewed, no changes at this time    Current Outpatient Medications on File Prior to Visit  Medication Sig Dispense Refill  . aspirin EC 81 MG tablet Take 81 mg by mouth daily.    [name] Kitchen atorvastatin (LIPITOR) 80 MG tablet Take 80 mg by mouth daily.   2  . Blood Glucose Monitoring Suppl (ONE TOUCH ULTRA SYSTEM KIT) w/Device KIT     . cephALEXin (KEFLEX) 500 MG capsule Take 500 mg by mouth 4 (four) times daily.    [name] Kitchen gabapentin (NEURONTIN) 300 MG capsule     . glimepiride (AMARYL) 4 MG tablet Take 4 mg by mouth daily with breakfast.    . ibuprofen (ADVIL) 800 MG tablet Take 800 mg by mouth every 8 (eight) hours as needed.    [name] Kitchen lisinopril (PRINIVIL,ZESTRIL) 10 MG tablet Take 10 mg by mouth 2 (two) times daily.     . meloxicam (MOBIC) 15 MG tablet Take 15 mg by mouth daily.    . methocarbamol (ROBAXIN) 500 MG tablet Take 1 tablet (500 mg total) by mouth every 6 (six) hours as needed for muscle spasms. 90 tablet 0  . metoprolol succinate (TOPROL-XL) 50 MG 24 hr tablet Take 50 mg by mouth daily.     . mupirocin ointment (BACTROBAN) 2 % Apply 1 application topically daily. Qd to aa back until clear 22 g 1  . Omega-3 1000 MG CAPS Take by  mouth.    [name] Kitchen omeprazole (PRILOSEC) 40 MG capsule Take 40 mg by mouth daily as needed (heartburn).   3  . tamsulosin (FLOMAX) 0.4 MG CAPS capsule Take 0.4 mg by mouth daily after supper.     [name] Kitchen oxyCODONE (OXY IR/ROXICODONE) 5 MG immediate release tablet Take 1 tablet (5 mg total) by mouth every 4 (four) hours as needed for moderate pain ((score 4 to 6)). (Patient not taking: Reported on 04/11/2019) 30 tablet 0  . polyethylene glycol powder (GLYCOLAX/MIRALAX) powder 255 grams one bottle for colonoscopy prep (Patient not taking: Reported on 04/26/2019) 255 g 0   No current facility-administered medications on file prior to visit.    There are no Patient Instructions on file for this visit. No follow-ups on file.   Kris Hartmann, NP

## 2019-11-14 ENCOUNTER — Telehealth: Payer: Self-pay

## 2019-11-14 NOTE — Telephone Encounter (Signed)
Noted that Blake [name] has had his esophageal stent removed. Would like for him to follow up with Dr. Tasia Catchings for any additional adjuvant treatment needed. Voicemail left with Mr. Kotlyar to return call for an update on how he is doing.

## 2019-11-29 ENCOUNTER — Ambulatory Visit: Payer: Medicare Other

## 2019-11-29 ENCOUNTER — Telehealth: Payer: Self-pay

## 2019-11-29 ENCOUNTER — Ambulatory Visit: Payer: Self-pay | Attending: Internal Medicine

## 2019-11-29 DIAGNOSIS — Z23 Encounter for immunization: Secondary | ICD-10-CM

## 2019-11-29 NOTE — Telephone Encounter (Signed)
Spoke to Blake [name] on 8/16 regarding follow up with Dr. Tasia Catchings. He was to meet with Dr. Fanny Skates at [facility] regarding further treatment. He voiced he was going to meet with her but he desired his treatment locally. Noted that he met with Dr. Fanny Skates 8/30 and her recommendation was for XELOX every 3 weeks for 6 months. Will update Dr. Tasia Catchings. Awaiting final note from Dr. Fanny Skates.

## 2019-11-29 NOTE — Progress Notes (Signed)
   Covid-19 Vaccination Clinic  Name:  Blake [name]    MRN: [id number] DOB: 1945/10/29  11/29/2019  Blake [name] was observed post Covid-19 immunization for 15 minutes without incident. He was provided with Vaccine Information Sheet and instruction to access the V-Safe system.   Blake [name] was instructed to call 911 with any severe reactions post vaccine: [name] Kitchen Difficulty breathing  . Swelling of face and throat  . A fast heartbeat  . A bad rash all over body  . Dizziness and weakness

## 2019-12-07 ENCOUNTER — Telehealth: Payer: Self-pay

## 2019-12-07 NOTE — Telephone Encounter (Signed)
Taked with patient to confirm New Pt appt. He did not receive packet in the mail , asked him to come in early to fill out history form. He will also bring in a list of his medications.

## 2019-12-08 ENCOUNTER — Ambulatory Visit
Payer: Medicare Other | Attending: Student in an Organized Health Care Education/Training Program | Admitting: Student in an Organized Health Care Education/Training Program

## 2019-12-08 ENCOUNTER — Encounter: Payer: Self-pay | Admitting: Student in an Organized Health Care Education/Training Program

## 2019-12-08 ENCOUNTER — Other Ambulatory Visit: Payer: Self-pay

## 2019-12-08 VITALS — BP 168/85 | HR 84 | Temp 97.4°F | Ht 65.0 in | Wt 164.0 lb

## 2019-12-08 DIAGNOSIS — M5136 Other intervertebral disc degeneration, lumbar region: Secondary | ICD-10-CM | POA: Diagnosis present

## 2019-12-08 DIAGNOSIS — M19011 Primary osteoarthritis, right shoulder: Secondary | ICD-10-CM | POA: Diagnosis present

## 2019-12-08 DIAGNOSIS — Z9889 Other specified postprocedural states: Secondary | ICD-10-CM | POA: Diagnosis present

## 2019-12-08 DIAGNOSIS — I471 Supraventricular tachycardia: Secondary | ICD-10-CM | POA: Insufficient documentation

## 2019-12-08 DIAGNOSIS — G894 Chronic pain syndrome: Secondary | ICD-10-CM | POA: Diagnosis present

## 2019-12-08 DIAGNOSIS — M48062 Spinal stenosis, lumbar region with neurogenic claudication: Secondary | ICD-10-CM

## 2019-12-08 DIAGNOSIS — M5416 Radiculopathy, lumbar region: Secondary | ICD-10-CM | POA: Diagnosis present

## 2019-12-08 NOTE — Patient Instructions (Signed)

## 2019-12-08 NOTE — Progress Notes (Signed)
PROVIDER NOTE: Information contained herein reflects review and annotations entered in association with encounter. Interpretation of such information and data should be left to medically-trained personnel. Information provided to patient can be located elsewhere in the medical record under "Patient Instructions". Document created using STT-dictation technology, any transcriptional errors that may result from process are unintentional.    Patient: Blake [name]  Service Category: E/M  Provider: Gillis Santa, MD  DOB: 07-17-1945  DOS: 12/08/2019  Specialty: Interventional Pain Management  MRN: [id number]  Setting: Ambulatory outpatient  PCP: Idelle Crouch, MD  Type: Established Patient    Referring Provider: Idelle Crouch, MD  Location: Office  Delivery: Face-to-face     HPI  Reason for encounter: [name], a 74 y.o. year old male, is here today for evaluation and management of his Lumbar radiculopathy [M54.16]. Blake [name] primary complain today is Hip Pain (left) Last encounter: Practice (12/07/2019). 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 Pain Assessment: Severity of Chronic pain is reported as a 9 /10. Location: Hip Left/pain radiaties down left leg down knee. Onset: More than a month ago. Quality: Constant, Burning, Aching. Timing: Constant. Modifying factor(s): sitting down. Vitals:  height is '5\' 5"'  (1.651 m) and weight is 164 lb (74.4 kg). His temperature is 97.4 F (36.3 C) (abnormal). His blood pressure is 168/85 (abnormal) and his pulse is 84. His oxygen saturation is 98%.   Mr. [name] presents today with worsening low back pain that radiates into his left leg in a posterior lateral distribution related to lumbar  radiculopathy.  Of note, patient's last visit with me was on 09/14/2017 for lumbar radicular pain.  At that time he had undergone a series of 3 L4-L5 midline and lumbar epidural steroid injections.  These did provide him with symptomatic pain relief but given worsening pain after his last injection, patient had a L3-L4 microdiscectomy and L4-L5 microdiscectomy laminectomy on 03/03/2018 with Dr. Cari Caraway.  He states that this did help with his pain and made it easier for him to walk.  In the interim, patient has had esophageal cancer unfortunately has been treated at [facility] for this with a esophagogastrectomy.  He had a prolonged stay at [facility].  He states that he was bedbound for almost 3 weeks.  When he was able to walk, he states that he noticed severe pain radiating down his left leg.  He would like to repeat spinal injection.   ROS  Constitutional: Denies any fever or chills Gastrointestinal: No reported hemesis, hematochezia, vomiting, or acute GI distress Musculoskeletal: LBP with radiation to left leg Neurological: No reported episodes of acute onset apraxia, aphasia, dysarthria, agnosia, amnesia, paralysis, loss of coordination, or loss of consciousness  Medication Review  ONE TOUCH ULTRA SYSTEM KIT, Omega-3, aspirin EC, atorvastatin, cephALEXin, gabapentin, glimepiride, ibuprofen, lisinopril, meloxicam, methocarbamol, metoprolol succinate, mupirocin ointment, omeprazole, oxyCODONE, polyethylene glycol powder, and tamsulosin  History Review  Allergy: Blake [name] is allergic to sulfa antibiotics. Drug: Blake [name]  reports no history of drug use. Alcohol:  reports current alcohol use. Tobacco:  reports that he quit smoking about 25 years ago. His smoking use included cigarettes. He has a 30.00 pack-year smoking history. He has never used smokeless tobacco. Social: Mr. Trimpe  reports that he quit smoking about 25  years ago. His smoking use included cigarettes. He has a 30.00 pack-year smoking history.  He has never used smokeless tobacco. He reports current alcohol use. He reports that he does not use drugs. Medical:  has a past medical history of Arthritis, Back pain, Basal cell carcinoma (09/04/2009), Basal cell carcinoma (07/31/2014), Basal cell carcinoma (07/30/2016), Cancer ([place]), 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, bilateral, Coronary artery disease, Dyspnea, GERD (gastroesophageal reflux disease), Hyperlipidemia, Hypertension, Numbness and tingling of both lower extremities, and Prostate enlargement. Surgical: Blake [name]  has a past surgical history that includes Partial hip arthroplasty (2014); Tongue Biopsy (2006); Port-a-cath removal; Colonoscopy (12/08/2003); Tonsillectomy; Upper gi endoscopy (01/25/14); Lumbar laminectomy/decompression microdiscectomy (N/A, 03/03/2018); Joint replacement (Right); Trigger finger release (Right, 2013); Colonoscopy with propofol (N/A, 03/21/2019); Esophagogastroduodenoscopy (egd) with propofol (N/A, 03/21/2019); esophogeal cancer; and Abdominal surgery. Family: family history includes Diabetes in his mother; Lung cancer in his father.  Laboratory Chemistry Profile   Renal Lab Results  Component Value Date   BUN 18 04/11/2019   CREATININE 1.04 04/11/2019   GFRAA >60 04/11/2019   GFRNONAA >60 04/11/2019     Hepatic Lab Results  Component Value Date   AST 23 04/11/2019   ALT 37 04/11/2019   ALBUMIN 4.2 04/11/2019   ALKPHOS 91 04/11/2019     Electrolytes Lab Results  Component Value Date   NA 138 04/11/2019   K 4.5 04/11/2019   CL 103 04/11/2019   CALCIUM 8.8 (L) 04/11/2019     Bone No results found for: VD25OH, [id number], MV7846NG2, XB2841LK4, 25OHVITD1, 25OHVITD2, 25OHVITD3, TESTOFREE, TESTOSTERONE   Inflammation (CRP: Acute Phase) (ESR: Chronic Phase) Lab Results  Component Value Date   ESRSEDRATE 1 01/12/2013       Note: Above Lab results reviewed.  Recent Imaging Review  VAS US CAROTID Carotid Arterial Duplex  Study  Indications:   Carotid artery disease. Risk Factors:  Hyperlipidemia. Other Factors: Carotid stenosis.  Performing Technologist: Concha Norway RVT    Examination Guidelines: A complete evaluation includes B-mode imaging, spectral Doppler, color Doppler, and power Doppler as needed of all accessible portions of each vessel. Bilateral testing is considered an integral part of a complete examination. Limited examinations for reoccurring indications may be performed as noted.    Right Carotid Findings: +----------+--------+--------+--------+-----------------------+--------+           PSV cm/sEDV cm/sStenosisPlaque Description     Comments +----------+--------+--------+--------+-----------------------+--------+ CCA Prox  70      20                                              +----------+--------+--------+--------+-----------------------+--------+ CCA Mid   57      12                                              +----------+--------+--------+--------+-----------------------+--------+ CCA Distal68      17                                              +----------+--------+--------+--------+-----------------------+--------+ ICA Prox  64      18                                              +----------+--------+--------+--------+-----------------------+--------+  ICA Mid   140     46      40-59%  smooth and heterogenous         +----------+--------+--------+--------+-----------------------+--------+ ICA Distal92      29                                              +----------+--------+--------+--------+-----------------------+--------+ ECA       91      8                                               +----------+--------+--------+--------+-----------------------+--------+  +----------+--------+-------+----------------+-------------------+           PSV cm/sEDV cmsDescribe        Arm Pressure  (mmHG) +----------+--------+-------+----------------+-------------------+ [id number]             Multiphasic, WNL                    +----------+--------+-------+----------------+-------------------+  +---------+--------+--+--------+---------+ VertebralPSV cm/s33EDV cm/sAntegrade +---------+--------+--+--------+---------+  ICA/CCA ratio = 2.45  Left Carotid Findings: +----------+--------+--------+--------+------------------+--------+           PSV cm/sEDV cm/sStenosisPlaque DescriptionComments +----------+--------+--------+--------+------------------+--------+ CCA Prox  117     20                                         +----------+--------+--------+--------+------------------+--------+ CCA Mid   67      14                                         +----------+--------+--------+--------+------------------+--------+ CCA Distal67      14                                         +----------+--------+--------+--------+------------------+--------+ ICA Prox  83      31                                         +----------+--------+--------+--------+------------------+--------+ ICA Mid   89      27      1-39%   calcific                   +----------+--------+--------+--------+------------------+--------+ ICA Distal85      31                                         +----------+--------+--------+--------+------------------+--------+ ECA       136     16                                         +----------+--------+--------+--------+------------------+--------+  +----------+--------+--------+----------------+-------------------+           PSV cm/sEDV cm/sDescribe  Arm Pressure (mmHG) +----------+--------+--------+----------------+-------------------+ [id number]             Multiphasic, WNL                     +----------+--------+--------+----------------+-------------------+  +---------+--------+--+--------+---------+ VertebralPSV cm/s44EDV cm/sAntegrade +---------+--------+--+--------+---------+  ICA/CCA ratio = 1.3     Summary: Right Carotid: Velocities in the right ICA are consistent with a 60-79%                stenosis. ICA stenosis by velocity suggests the high end of                40-59%, however by 2D area measurement it appears to be 65-70% in                the proximal ICA due to smooth heterogeneous plaque.  Left Carotid: Velocities in the left ICA are consistent with a 1-39% stenosis.               Mild calcified plaque in the bulb/ICA.  Vertebrals:  Bilateral vertebral arteries demonstrate antegrade flow. Subclavians: Normal flow hemodynamics were seen in bilateral subclavian              arteries.  *See table(s) above for measurements and observations.    Electronically signed by Leotis Pain MD on 11/08/2019 at 5:25:10 PM.      Final   Note: Reviewed        Physical Exam  General appearance: Well nourished, well developed, and well hydrated. In no apparent acute distress Mental status: Alert, oriented x 3 (person, place, & time)       Respiratory: No evidence of acute respiratory distress Eyes: PERLA Vitals: BP (!) 168/85   Pulse 84   Temp (!) 97.4 F (36.3 C)   Ht '5\' 5"'  (1.651 m)   Wt 164 lb (74.4 kg)   SpO2 98%   BMI 27.29 kg/m  BMI: Estimated body mass index is 27.29 kg/m as calculated from the following:   Height as of this encounter: '5\' 5"'  (1.651 m).   Weight as of this encounter: 164 lb (74.4 kg). Ideal: Ideal body weight: 61.5 kg (135 lb 9.3 oz) Adjusted ideal body weight: 66.7 kg (146 lb 15.2 oz)  Lumbar Spine Area Exam  Skin & Axial Inspection: Well healed scar from previous spine surgery detected Alignment: Symmetrical Functional ROM: Pain restricted ROM affecting primarily the left Stability: No instability detected Muscle  Tone/Strength: Functionally intact. No obvious neuro-muscular anomalies detected. Sensory (Neurological): Dermatomal pain pattern left L4-L5 Provocative Tests: Hyperextension/rotation test: deferred today       Lumbar quadrant test (Kemp's test): (+) on the left for foraminal stenosis Lateral bending test: deferred today       Patrick's Maneuver: deferred today                   FABER* test: deferred today                   S-I anterior distraction/compression test: deferred today         S-I lateral compression test: deferred today         S-I Thigh-thrust test: deferred today         S-I Gaenslen's test: deferred today         *(Flexion, ABduction and External Rotation) Gait & Posture Assessment  Ambulation: Unassisted Gait: Relatively normal for age and body habitus Posture: WNL  Lower Extremity Exam    Side: Right lower  extremity  Side: Left lower extremity  Stability: No instability observed          Stability: No instability observed          Skin & Extremity Inspection: Skin color, temperature, and hair growth are WNL. No peripheral edema or cyanosis. No masses, redness, swelling, asymmetry, or associated skin lesions. No contractures.  Skin & Extremity Inspection: Skin color, temperature, and hair growth are WNL. No peripheral edema or cyanosis. No masses, redness, swelling, asymmetry, or associated skin lesions. No contractures.  Functional ROM: Unrestricted ROM                  Functional ROM: Pain restricted ROM for hip and knee joints Limited SLR (straight leg raise)  Muscle Tone/Strength: Functionally intact. No obvious neuro-muscular anomalies detected.  Muscle Tone/Strength: Functionally intact. No obvious neuro-muscular anomalies detected.  Sensory (Neurological): Unimpaired        Sensory (Neurological): Dermatomal pain pattern        DTR: Patellar: deferred today Achilles: deferred today Plantar: deferred today  DTR: Patellar: deferred today Achilles: deferred  today Plantar: deferred today  Palpation: No palpable anomalies  Palpation: No palpable anomalies    Assessment   Status Diagnosis  Having a Flare-up Having a Flare-up Persistent 1. Lumbar radiculopathy   2. Lumbar degenerative disc disease   3. Spinal stenosis, lumbar region, with neurogenic claudication   4. Primary osteoarthritis of right shoulder   5. Chronic pain syndrome   6. History of lumbar laminectomy      Updated Problems: Problem  Malignant Neoplasm of Lower Third of Esophagus ([facility])  Tongue Cancer ([facility])   Overview:  with radiation and chemotherapy   Lumbar Radiculopathy  Lumbar Degenerative Disc Disease  Spinal Stenosis, Lumbar Region, With Neurogenic Claudication  Chronic Pain Syndrome  History of Lumbar Laminectomy   L3-L4 and L4-L5 microdiscectomy and laminectomy with Dr. Cari Caraway December 2019     Plan of Care   Given lumbar radicular pain flare, status post L3-L4 and L4-L5 microdiscectomy and laminectomy, recommend caudal ESI for radicular pain.  Risks and benefits reviewed and patient like to proceed.  Orders:  Orders Placed This Encounter  Procedures  . Caudal Epidural Injection    Standing Status:   Future    Standing Expiration Date:   01/07/2020    Scheduling Instructions:     Laterality: Midline     Level(s): Sacrococcygeal canal (Tailbone area)     Sedation: without     Scheduling Timeframe: As soon as pre-approved    Order Specific Question:   Where will this procedure be performed?    Answer:   [facility]   Follow-up plan:   Return in about 1 week (around 12/15/2019) for caudal w/o sedation.     Series of 3 L4-L5 epidural steroid injections followed by L3-L4 and L4-L5 microdiscectomy and laminectomy by Dr. Cari Caraway December 2019.  Return of left radicular pain given immobility after surgery for esophageal cancer.  Plan for caudal ESI, left.   Recent Visits No visits were found meeting these conditions. Showing recent  visits within past 90 days and meeting all other requirements Today's Visits Date Type Provider Dept  12/08/19 Office Visit Gillis Santa, MD [facility]  Showing today's visits and meeting all other requirements Future Appointments Date Type Provider Dept  12/14/19 Appointment Gillis Santa, MD [facility]  Showing future appointments within next 90 days and meeting all other requirements  I discussed the assessment and treatment plan  with the patient. The patient was provided an opportunity to ask questions and all were answered. The patient agreed with the plan and demonstrated an understanding of the instructions.  Patient advised to call back or seek an in-person evaluation if the symptoms or condition worsens.  Duration of encounter:30 minutes.  Note by: Gillis Santa, MD Date: 12/08/2019; Time: 9:38 AM

## 2019-12-08 NOTE — Progress Notes (Signed)
Safety precautions to be maintained throughout the outpatient stay will include: orient to surroundings, keep bed in low position, maintain call bell within reach at all times, provide assistance with transfer out of bed and ambulation.  

## 2019-12-09 ENCOUNTER — Encounter: Payer: Self-pay | Admitting: Oncology

## 2019-12-09 ENCOUNTER — Inpatient Hospital Stay: Payer: Medicare Other | Attending: Oncology | Admitting: Oncology

## 2019-12-09 ENCOUNTER — Telehealth: Payer: Self-pay

## 2019-12-09 VITALS — BP 148/79 | HR 73 | Temp 97.9°F | Resp 16 | Wt 164.9 lb

## 2019-12-09 DIAGNOSIS — R12 Heartburn: Secondary | ICD-10-CM | POA: Diagnosis not present

## 2019-12-09 DIAGNOSIS — Z7189 Other specified counseling: Secondary | ICD-10-CM | POA: Diagnosis not present

## 2019-12-09 DIAGNOSIS — Z8589 Personal history of malignant neoplasm of other organs and systems: Secondary | ICD-10-CM | POA: Diagnosis not present

## 2019-12-09 DIAGNOSIS — Z5111 Encounter for antineoplastic chemotherapy: Secondary | ICD-10-CM | POA: Insufficient documentation

## 2019-12-09 DIAGNOSIS — C159 Malignant neoplasm of esophagus, unspecified: Secondary | ICD-10-CM | POA: Diagnosis not present

## 2019-12-09 DIAGNOSIS — C155 Malignant neoplasm of lower third of esophagus: Secondary | ICD-10-CM | POA: Insufficient documentation

## 2019-12-09 MED ORDER — CAPECITABINE 500 MG PO TABS: 1500.0000 mg | ORAL_TABLET | Freq: Two times a day (BID) | ORAL | 0 refills | Status: DC

## 2019-12-09 MED ORDER — PROCHLORPERAZINE MALEATE 10 MG PO TABS: 10.0000 mg | ORAL_TABLET | Freq: Four times a day (QID) | ORAL | 1 refills | Status: DC | PRN

## 2019-12-09 MED ORDER — CAPECITABINE 150 MG PO TABS: 300.0000 mg | ORAL_TABLET | Freq: Two times a day (BID) | ORAL | 0 refills | Status: DC

## 2019-12-09 MED ORDER — ONDANSETRON HCL 8 MG PO TABS: 8.0000 mg | ORAL_TABLET | Freq: Two times a day (BID) | ORAL | 1 refills | Status: DC | PRN

## 2019-12-09 NOTE — Telephone Encounter (Signed)
error 

## 2019-12-09 NOTE — Progress Notes (Signed)
Gastroesophageal - No Medical Intervention - Off Treatment.  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

## 2019-12-09 NOTE — Progress Notes (Signed)
Patient had surgery at [facility] and has been home for 6 weeks.

## 2019-12-09 NOTE — Patient Instructions (Signed)
Oxaliplatin Injection What is this medicine? OXALIPLATIN (ox AL i PLA tin) is a chemotherapy drug. It targets fast dividing cells, like cancer cells, and causes these cells to die. This medicine is used to treat cancers of the colon and rectum, and many other cancers. This medicine may be used for other purposes; ask your health care provider or pharmacist if you have questions. COMMON BRAND NAME(S): Eloxatin What should I tell my health care provider before I take this medicine? They need to know if you have any of these conditions:  heart disease  history of irregular heartbeat  liver disease  low blood counts, like white cells, platelets, or red blood cells  lung or breathing disease, like asthma  take medicines that treat or prevent blood clots  tingling of the fingers or toes, or other nerve disorder  an unusual or allergic reaction to oxaliplatin, other chemotherapy, other medicines, foods, dyes, or preservatives  pregnant or trying to get pregnant  breast-feeding How should I use this medicine? This drug is given as an infusion into a vein. It is administered in a hospital or clinic by a specially trained health care professional. Talk to your pediatrician regarding the use of this medicine in children. Special care may be needed. Overdosage: If you think you have taken too much of this medicine contact a poison control center or emergency room at once. NOTE: This medicine is only for you. Do not share this medicine with others. What if I miss a dose? It is important not to miss a dose. Call your doctor or health care professional if you are unable to keep an appointment. What may interact with this medicine? Do not take this medicine with any of the following medications:  cisapride  dronedarone  pimozide  thioridazine This medicine may also interact with the following medications:  aspirin and aspirin-like medicines  certain medicines that treat or prevent  blood clots like warfarin, apixaban, dabigatran, and rivaroxaban  cisplatin  cyclosporine  diuretics  medicines for infection like acyclovir, adefovir, amphotericin B, bacitracin, cidofovir, foscarnet, ganciclovir, gentamicin, pentamidine, vancomycin  NSAIDs, medicines for pain and inflammation, like ibuprofen or naproxen  other medicines that prolong the QT interval (an abnormal heart rhythm)  pamidronate  zoledronic acid This list may not describe all possible interactions. Give your health care provider a list of all the medicines, herbs, non-prescription drugs, or dietary supplements you use. Also tell them if you smoke, drink alcohol, or use illegal drugs. Some items may interact with your medicine. What should I watch for while using this medicine? Your condition will be monitored carefully while you are receiving this medicine. You may need blood work done while you are taking this medicine. This medicine may make you feel generally unwell. This is not uncommon as chemotherapy can affect healthy cells as well as cancer cells. Report any side effects. Continue your course of treatment even though you feel ill unless your healthcare professional tells you to stop. This medicine can make you more sensitive to cold. Do not drink cold drinks or use ice. Cover exposed skin before coming in contact with cold temperatures or cold objects. When out in cold weather wear warm clothing and cover your mouth and nose to warm the air that goes into your lungs. Tell your doctor if you get sensitive to the cold. Do not become pregnant while taking this medicine or for 9 months after stopping it. Women should inform their health care professional if they wish to become   pregnant or think they might be pregnant. Men should not father a child while taking this medicine and for 6 months after stopping it. There is potential for serious side effects to an unborn child. Talk to your health care professional  for more information. Do not breast-feed a child while taking this medicine or for 3 months after stopping it. This medicine has caused ovarian failure in some women. This medicine may make it more difficult to get pregnant. Talk to your health care professional if you are concerned about your fertility. This medicine has caused decreased sperm counts in some men. This may make it more difficult to father a child. Talk to your health care professional if you are concerned about your fertility. This medicine may increase your risk of getting an infection. Call your health care professional for advice if you get a fever, chills, or sore throat, or other symptoms of a cold or flu. Do not treat yourself. Try to avoid being around people who are sick. Avoid taking medicines that contain aspirin, acetaminophen, ibuprofen, naproxen, or ketoprofen unless instructed by your health care professional. These medicines may hide a fever. Be careful brushing or flossing your teeth or using a toothpick because you may get an infection or bleed more easily. If you have any dental work done, tell your dentist you are receiving this medicine. What side effects may I notice from receiving this medicine? Side effects that you should report to your doctor or health care professional as soon as possible:  allergic reactions like skin rash, itching or hives, swelling of the face, lips, or tongue  breathing problems  cough  low blood counts - this medicine may decrease the number of white blood cells, red blood cells, and platelets. You may be at increased risk for infections and bleeding  nausea, vomiting  pain, redness, or irritation at site where injected  pain, tingling, numbness in the hands or feet  signs and symptoms of bleeding such as bloody or black, tarry stools; red or dark brown urine; spitting up blood or brown material that looks like coffee grounds; red spots on the skin; unusual bruising or bleeding  from the eyes, gums, or nose  signs and symptoms of a dangerous change in heartbeat or heart rhythm like chest pain; dizziness; fast, irregular heartbeat; palpitations; feeling faint or lightheaded; falls  signs and symptoms of infection like fever; chills; cough; sore throat; pain or trouble passing urine  signs and symptoms of liver injury like dark yellow or brown urine; general ill feeling or flu-like symptoms; light-colored stools; loss of appetite; nausea; right upper belly pain; unusually weak or tired; yellowing of the eyes or skin  signs and symptoms of low red blood cells or anemia such as unusually weak or tired; feeling faint or lightheaded; falls  signs and symptoms of muscle injury like dark urine; trouble passing urine or change in the amount of urine; unusually weak or tired; muscle pain; back pain Side effects that usually do not require medical attention (report to your doctor or health care professional if they continue or are bothersome):  changes in taste  diarrhea  gas  hair loss  loss of appetite  mouth sores This list may not describe all possible side effects. Call your doctor for medical advice about side effects. You may report side effects to FDA at 1-800-FDA-1088. Where should I keep my medicine? This drug is given in a hospital or clinic and will not be stored at home. NOTE:   This sheet is a summary. It may not cover all possible information. If you have questions about this medicine, talk to your doctor, pharmacist, or health care provider.  2020 [employer] Standard (2018-08-04 12:20:35)   Capecitabine tablets What is this medicine? CAPECITABINE (ka pe SITE a been) is a chemotherapy drug. It slows the growth of cancer cells. This medicine is used to treat breast cancer, and also colon or rectal cancer. This medicine may be used for other purposes; ask your health care provider or pharmacist if you have questions. COMMON BRAND NAME(S): Xeloda What  should I tell my health care provider before I take this medicine? They need to know if you have any of these conditions:  bleeding disorders  dehydration  dihydropyrimidine dehydrogenase (DPD) deficiency  heart disease  infection (especially a virus infection such as chickenpox, cold sores, or herpes)  kidney disease  liver disease  low blood counts, like low white cell, platelet, or red cell counts  an unusual or allergic reaction to capecitabine, fluorouracil, other medicines, foods, dyes, or preservatives  pregnant or trying to get pregnant  breast-feeding How should I use this medicine? Take this medicine by mouth with a glass of water, within 30 minutes of the end of a meal. Do not cut, crush or chew this medicine. Follow the directions on the prescription label. Take your medicine at regular intervals. Do not take it more often than directed. Do not stop taking except on your doctor's advice. Your doctor may want you to take a combination of 150 mg and 500 mg tablets for each dose. It is very important that you know how to correctly take your dose. Taking the wrong tablets could result in an overdose (too much medication) or underdose (too little medication). Talk to your pediatrician regarding the use of this medicine in children. Special care may be needed. Overdosage: If you think you have taken too much of this medicine contact a poison control center or emergency room at once. NOTE: This medicine is only for you. Do not share this medicine with others. What if I miss a dose? If you miss a dose, do not take the missed dose at all. Do not take double or extra doses. Instead, continue with your next scheduled dose and check with your doctor. What may interact with this medicine? This medicine may interact with the following medications:  allopurinol  leucovorin  phenytoin  warfarin This list may not describe all possible interactions. Give your health care provider  a list of all the medicines, herbs, non-prescription drugs, or dietary supplements you use. Also tell them if you smoke, drink alcohol, or use illegal drugs. Some items may interact with your medicine. What should I watch for while using this medicine? Visit your doctor for checks on your progress. This drug may make you feel generally unwell. This is not uncommon, as chemotherapy can affect healthy cells as well as cancer cells. Report any side effects. Continue your course of treatment even though you feel ill unless your doctor tells you to stop. In some cases, you may be given additional medicines to help with side effects. Follow all directions for their use. Call your doctor or health care professional for advice if you get a fever, chills or sore throat, or other symptoms of a cold or flu. Do not treat yourself. This drug decreases your body's ability to fight infections. Try to avoid being around people who are sick. This medicine may increase your risk to bruise or   bleed. Call your doctor or health care professional if you notice any unusual bleeding. Be careful brushing and flossing your teeth or using a toothpick because you may get an infection or bleed more easily. If you have any dental work done, tell your dentist you are receiving this medicine. Avoid taking products that contain aspirin, acetaminophen, ibuprofen, naproxen, or ketoprofen unless instructed by your doctor. These medicines may hide a fever. Do not become pregnant while taking this medicine or for 6 months after stopping it. Women should inform their doctor if they wish to become pregnant or think they might be pregnant. There is a potential for serious side effects to an unborn child. Talk to your health care professional or pharmacist for more information. Do not breast-feed an infant while taking this medicine or for 2 weeks after stopping it. Men are advised not to father a child while taking this medicine or for 3 months  after stopping it. This medicine may make it more difficult to get pregnant or father a child. Talk with your doctor or health care professional if you are concerned about your fertility. What side effects may I notice from receiving this medicine? Side effects that you should report to your doctor or health care professional as soon as possible:  allergic reactions like skin rash, itching or hives, swelling of the face, lips, or tongue  diarrhea  low blood counts - this medicine may decrease the number of white blood cells, red blood cells and platelets. You may be at increased risk for infections and bleeding  nausea, vomiting  redness, blistering, peeling or loosening of the skin, including inside the mouth (this can be added for any serious or exfoliative rash that could lead to hospitalization)  redness, swelling, or sores on hands or feet  signs and symptoms of kidney injury like trouble passing urine or change in the amount of urine  signs and symptoms of liver injury like dark yellow or brown urine; general ill feeling or flu-like symptoms; light-colored stools; loss of appetite; nausea; right upper belly pain; unusually weak or tired; yellowing of the eyes or skin  signs of decreased platelets or bleeding - bruising, pinpoint red spots on the skin, black, tarry stools, blood in the urine  signs of decreased red blood cells - unusually weak or tired, fainting spells, lightheadedness  signs of infection - fever or chills, cough, sore throat, pain or difficulty passing urine Side effects that usually do not require medical attention (report to your doctor or health care professional if they continue or are bothersome):  changes in vision  constipation  loss of appetite  mouth sores  pain, tingling, numbness in the hands or feet This list may not describe all possible side effects. Call your doctor for medical advice about side effects. You may report side effects to FDA at  1-800-FDA-1088. Where should I keep my medicine? Keep out of the reach of children. Store at room temperature between 15 and 30 degrees C (59 and 86 degrees F). Keep container tightly closed. Throw away any unused medicine after the expiration date. NOTE: This sheet is a summary. It may not cover all possible information. If you have questions about this medicine, talk to your doctor, pharmacist, or health care provider.  2020 [employer] Standard (2017-06-30 21:22:45)  

## 2019-12-09 NOTE — Progress Notes (Signed)
Hematology/Oncology note [facility] [phone] [phone]   Patient Care Team: Idelle Crouch, MD as PCP - General (Internal Medicine) Christene Lye, MD (General Surgery) Clent Jacks, RN as Oncology Nurse Navigator  REFERRING PROVIDER: Idelle Crouch, MD  CHIEF COMPLAINTS/REASON FOR VISIT:  Follow up  esophageal cancer  HISTORY OF PRESENTING ILLNESS:   Blake [name] is a  74 y.o.  male with PMH listed below was seen in consultation at the request of  Idelle Crouch, MD  for evaluation of esophageal cancer Patient has longstanding Barrett's esophagus. 03/21/2019 patient underwent surveillance endoscopy which showed esophageal mucosal changes secondary to long segment Barrett's disease present in the lower third of the esophagus.  Maximal longitudinal extent of these mucosal changes were 7 cm in length.  Mucosa was biopsied with cold forceps for histology in 4 quadrants at the interval of 2 cm in the lower third of esophagus.  2 cm hiatal hernia, gastritis. 03/21/2019 colonoscopy showed diverticulosis in the sigmoid colon.  4 mm polyp in ascending colon was removed. 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  1 out of 4 esophagus biopsy showed Barrett's mucosa with erosion, indefinite for s. Colon polyp biopsy showed tubular adenoma. Patient was referred to cancer center for further evaluation and management.  04/06/2019 CT chest abdomen pelvis showed no evidence of metastatic disease in the chest abdomen or pelvis.  No esophageal mass lesion evident.  Small to moderate hiatal hernia with possible wall thickening proximal stomach.  3 mm left lower lobe pulmonary nodule attention on follow-up recommended to ensure stability.  Tiny hypodensities in the left kidney with attenuation too high to be simple cyst.  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 Jeb Levering.  His initial PET scan 11/08/2004 showed abnormal localization in a right neck mass possibly a lymph node, slightly asymmetric localization at the base of the tongue.  Pathology is not available in current EMR.   #06/09/19, EMR at [facility].  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 EMR specimen was received in multiple fragments and margins can not be evaluated.  1 gastric polyps were biopsied which showed fundic gland polyp. Negative for dysplasia  #08/08/19, to OR for EGD with sessile tumor from 29-33cm, at least 1.5 cm wide. Pathology with the following: 1) esophagus (30cm) - residual invasive adenocarcinoma, at least invasive to muscularis mucosa and 2) esophagus (30cm) - superficial fragments of highly dysplastic epithelium, focally suspicious for intramucosal carcinoma     INTERVAL HISTORY Blake [name] is a 74 y.o. male who has above history reviewed by me today presents for follow up visit for management of esophageal cancer.  Problems and complaints are listed below:  09/12/2019, patient underwent esophagectomy with pathological revealed  Negative esophageal margin, gastric margin, esophagogastrectomy, invasive moderately differentiated esophageal adenocarcinoma involved distal esophageal and GE junction, tumor focally invades the submucosa.  Left gastric lymph node x 4 negative, greater curvature lymph nodes 1/3 is positive for 41m metastatic carcinoma.  Periesophageal lymph node x 6, negative, final pathology stage pT1b pn1 11/28/2019, patient had a stent removal. Patient was seen by medical oncology Dr. UFanny Skatesand was recommended for adjuvant chemotherapy.  Patient prefers to receive treatment closer to home and patient call back to establish care.  Patient takes PPI for heartburn.  He reports some intermittent diarrhea ever  since his surgery. He was accompanied by his wife today   Review of Systems  Constitutional: Negative for appetite change, chills, fatigue, fever and unexpected weight change.  HENT:   Negative for hearing loss and voice change.   Eyes: Negative for eye problems and icterus.  Respiratory: Negative for chest tightness, cough and shortness of breath.   Cardiovascular: Negative for chest pain and leg swelling.  Gastrointestinal: Negative for abdominal distention and abdominal pain.       Heartburn  Endocrine: Negative for hot flashes.  Genitourinary: Negative for difficulty urinating, dysuria and frequency.   Musculoskeletal: Negative for arthralgias.  Skin: Negative for itching and rash.  Neurological: Negative for light-headedness and numbness.  Hematological: Negative for adenopathy. Does not bruise/bleed easily.  Psychiatric/Behavioral: Negative for confusion.    MEDICAL HISTORY:  Past Medical History:  Diagnosis Date   Arthritis    Back pain    with leg pain   Basal cell carcinoma 09/04/2009   right cheek 5.5 cm ant to earlobe, right ant nasal alar rim   Basal cell carcinoma 07/31/2014   right mid brow   Basal cell carcinoma 07/30/2016   right ant nasal alar rim at ant edge of BCC scar   Cancer ([place])    tongue   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, bilateral    Coronary artery disease    Dyspnea    GERD (gastroesophageal reflux disease)    Hyperlipidemia    Hypertension    Numbness and tingling of both lower extremities    with positioning   Prostate enlargement     SURGICAL HISTORY: Past Surgical History:  Procedure Laterality Date   ABDOMINAL SURGERY     COLONOSCOPY  12/08/2003   COLONOSCOPY WITH PROPOFOL N/A 03/21/2019   Procedure: COLONOSCOPY WITH PROPOFOL;  Surgeon: Toledo, Benay Pike, MD;  Location: [facility] ENDOSCOPY;  Service: Gastroenterology;  Laterality: N/A;   ESOPHAGOGASTRODUODENOSCOPY (EGD) WITH PROPOFOL N/A 03/21/2019    Procedure: ESOPHAGOGASTRODUODENOSCOPY (EGD) WITH PROPOFOL;  Surgeon: Toledo, Benay Pike, MD;  Location: [facility] ENDOSCOPY;  Service: Gastroenterology;  Laterality: N/A;   esophogeal cancer     JOINT REPLACEMENT Right    total hip   LUMBAR LAMINECTOMY/DECOMPRESSION MICRODISCECTOMY N/A 03/03/2018   Procedure: LUMBAR LAMINECTOMY/DECOMPRESSION MICRODISCECTOMY 1 LEVEL-L3-4,L4-5;  Surgeon: Meade Maw, MD;  Location: [facility] ORS;  Service: Neurosurgery;  Laterality: N/A;   PARTIAL HIP ARTHROPLASTY  2014   PORT-A-CATH REMOVAL     TONGUE BIOPSY  2006   TONSILLECTOMY     TRIGGER FINGER RELEASE Right 2013   UPPER GI ENDOSCOPY  01/25/14   multiple gastric polyps    SOCIAL HISTORY: Social History   Socioeconomic History   Marital status: Married    Spouse name: Not on file   Number of children: Not on file   Years of education: Not on file   Highest education level: Not on file  Occupational History   Not on file  Tobacco Use   Smoking status: Former Smoker    Packs/day: 1.00    Years: 30.00    Pack years: 30.00    Types: Cigarettes    Quit date: 02/18/1994    Years since quitting: 25.8   Smokeless tobacco: Never Used  Vaping Use   Vaping Use: Never used  Substance and Sexual Activity   Alcohol use: Yes   Drug use: No   Sexual activity: Yes  Other Topics Concern   Not on file  Social History Narrative  Not on file   Social Determinants of Health   Financial Resource Strain:    Difficulty of Paying Living Expenses: Not on file  Food Insecurity:    Worried About [place] in the Last Year: Not on file   Ran Out of Food in the Last Year: Not on file  Transportation Needs:    Lack of Transportation (Medical): Not on file   Lack of Transportation (Non-Medical): Not on file  Physical Activity:    Days of Exercise per Week: Not on file   Minutes of Exercise per Session: Not on file  Stress:    Feeling of Stress : Not on file  Social  Connections:    Frequency of Communication with Friends and Family: Not on file   Frequency of Social Gatherings with Friends and Family: Not on file   Attends Religious Services: Not on file   Active Member of Clubs or Organizations: Not on file   Attends Archivist Meetings: Not on file   Marital Status: Not on file  Intimate Partner Violence:    Fear of Current or Ex-Partner: Not on file   Emotionally Abused: Not on file   Physically Abused: Not on file   Sexually Abused: Not on file    FAMILY HISTORY: Family History  Problem Relation Age of Onset   Diabetes Mother    Lung cancer Father     ALLERGIES:  is allergic to sulfa antibiotics.  MEDICATIONS:  Current Outpatient Medications  Medication Sig Dispense Refill   amiodarone (PACERONE) 200 MG tablet Take 200 mg by mouth daily.     aspirin EC 81 MG tablet Take 81 mg by mouth daily.     Blood Glucose Monitoring Suppl (ONE TOUCH ULTRA SYSTEM KIT) w/Device KIT      gabapentin (NEURONTIN) 300 MG capsule prn     ibuprofen (ADVIL) 800 MG tablet Take 800 mg by mouth every 8 (eight) hours as needed.     methocarbamol (ROBAXIN) 500 MG tablet Take 1 tablet (500 mg total) by mouth every 6 (six) hours as needed for muscle spasms. 90 tablet 0   metoprolol succinate (TOPROL-XL) 50 MG 24 hr tablet Take 50 mg by mouth daily.      omeprazole (PRILOSEC) 40 MG capsule Take 40 mg by mouth in the morning and at bedtime.   3   tamsulosin (FLOMAX) 0.4 MG CAPS capsule Take 0.4 mg by mouth daily after supper.      atorvastatin (LIPITOR) 80 MG tablet Take 80 mg by mouth daily.  (Patient not taking: Reported on 12/08/2019)  2   cephALEXin (KEFLEX) 500 MG capsule Take 500 mg by mouth 4 (four) times daily. (Patient not taking: Reported on 12/09/2019)     glimepiride (AMARYL) 4 MG tablet Take 4 mg by mouth daily with breakfast. (Patient not taking: Reported on 12/09/2019)     lisinopril (PRINIVIL,ZESTRIL) 10 MG tablet Take 10  mg by mouth 2 (two) times daily.  (Patient not taking: Reported on 12/09/2019)     meloxicam (MOBIC) 15 MG tablet Take 15 mg by mouth daily. (Patient not taking: Reported on 12/09/2019)     mupirocin ointment (BACTROBAN) 2 % Apply 1 application topically daily. Qd to aa back until clear (Patient not taking: Reported on 12/09/2019) 22 g 1   Omega-3 1000 MG CAPS Take by mouth. (Patient not taking: Reported on 12/09/2019)     oxyCODONE (OXY IR/ROXICODONE) 5 MG immediate release tablet Take 1 tablet (5 mg total)  by mouth every 4 (four) hours as needed for moderate pain ((score 4 to 6)). (Patient not taking: Reported on 12/08/2019) 30 tablet 0   polyethylene glycol powder (GLYCOLAX/MIRALAX) powder 255 grams one bottle for colonoscopy prep (Patient not taking: Reported on 12/09/2019) 255 g 0   No current facility-administered medications for this visit.     PHYSICAL EXAMINATION: ECOG PERFORMANCE STATUS: 1 - Symptomatic but completely ambulatory Vitals:   12/09/19 1359  BP: (!) 148/79  Pulse: 73  Resp: 16  Temp: 97.9 F (36.6 C)   Filed Weights   12/09/19 1359  Weight: 164 lb 14.4 oz (74.8 kg)    Physical Exam Constitutional:      General: He is not in acute distress. HENT:     Head: Normocephalic and atraumatic.  Eyes:     General: No scleral icterus.    Pupils: Pupils are equal, round, and reactive to light.  Cardiovascular:     Rate and Rhythm: Normal rate and regular rhythm.     Heart sounds: Normal heart sounds.  Pulmonary:     Effort: Pulmonary effort is normal. No respiratory distress.     Breath sounds: No wheezing.  Abdominal:     General: Bowel sounds are normal. There is no distension.     Palpations: Abdomen is soft. There is no mass.     Tenderness: There is no abdominal tenderness.  Musculoskeletal:        General: No deformity. Normal range of motion.     Cervical back: Normal range of motion and neck supple.  Skin:    General: Skin is warm and dry.      Findings: No erythema or rash.  Neurological:     Mental Status: He is alert and oriented to person, place, and time. Mental status is at baseline.     Cranial Nerves: No cranial nerve deficit.     Coordination: Coordination normal.  Psychiatric:        Mood and Affect: Mood normal.        Behavior: Behavior normal.        Thought Content: Thought content normal.     LABORATORY DATA:  I have reviewed the data as listed Lab Results  Component Value Date   WBC 6.6 04/11/2019   HGB 15.3 04/11/2019   HCT 48.8 04/11/2019   MCV 92.1 04/11/2019   PLT 219 04/11/2019   Recent Labs    04/06/19 1150 04/11/19 1230  NA  --  138  K  --  4.5  CL  --  103  CO2  --  25  GLUCOSE  --  199*  BUN  --  18  CREATININE 1.13 1.04  CALCIUM  --  8.8*  GFRNONAA >60 >60  GFRAA >60 >60  PROT  --  7.2  ALBUMIN  --  4.2  AST  --  23  ALT  --  37  ALKPHOS  --  91  BILITOT  --  0.6   Iron/TIBC/Ferritin/ %Sat No results found for: IRON, TIBC, FERRITIN, IRONPCTSAT    RADIOGRAPHIC STUDIES: I have personally reviewed the radiological images as listed and agreed with the findings in the report. VAS US CAROTID  Result Date: 11/08/2019 Carotid Arterial Duplex Study Indications:   Carotid artery disease. Risk Factors:  Hyperlipidemia. Other Factors: Carotid stenosis. Performing Technologist: Concha Norway RVT  Examination Guidelines: A complete evaluation includes B-mode imaging, spectral Doppler, color Doppler, and power Doppler as needed of all accessible portions of each vessel.  Bilateral testing is considered an integral part of a complete examination. Limited examinations for reoccurring indications may be performed as noted.  Right Carotid Findings: +----------+--------+--------+--------+-----------------------+--------+             PSV cm/s EDV cm/s Stenosis Plaque Description      Comments  +----------+--------+--------+--------+-----------------------+--------+  CCA Prox   70       20                                                   +----------+--------+--------+--------+-----------------------+--------+  CCA Mid    57       12                                                  +----------+--------+--------+--------+-----------------------+--------+  CCA Distal 68       17                                                  +----------+--------+--------+--------+-----------------------+--------+  ICA Prox   64       18                                                  +----------+--------+--------+--------+-----------------------+--------+  ICA Mid    140      46       40-59%   smooth and heterogenous           +----------+--------+--------+--------+-----------------------+--------+  ICA Distal 92       29                                                  +----------+--------+--------+--------+-----------------------+--------+  ECA        91       8                                                   +----------+--------+--------+--------+-----------------------+--------+ +----------+--------+-------+----------------+-------------------+             PSV cm/s EDV cms Describe         Arm Pressure (mmHG)  +----------+--------+-------+----------------+-------------------+  Subclavian 90               Multiphasic, WNL                      +----------+--------+-------+----------------+-------------------+ +---------+--------+--+--------+---------+  Vertebral PSV cm/s 33 EDV cm/s Antegrade  +---------+--------+--+--------+---------+ ICA/CCA ratio = 2.45 Left Carotid Findings: +----------+--------+--------+--------+------------------+--------+             PSV cm/s EDV cm/s Stenosis Plaque Description Comments  +----------+--------+--------+--------+------------------+--------+  CCA Prox   117      20                                             +----------+--------+--------+--------+------------------+--------+  CCA Mid    67       14                                              +----------+--------+--------+--------+------------------+--------+  CCA Distal 67       14                                             +----------+--------+--------+--------+------------------+--------+  ICA Prox   83       31                                             +----------+--------+--------+--------+------------------+--------+  ICA Mid    89       27       1-39%    calcific                     +----------+--------+--------+--------+------------------+--------+  ICA Distal 85       31                                             +----------+--------+--------+--------+------------------+--------+  ECA        136      16                                             +----------+--------+--------+--------+------------------+--------+ +----------+--------+--------+----------------+-------------------+             PSV cm/s EDV cm/s Describe         Arm Pressure (mmHG)  +----------+--------+--------+----------------+-------------------+  Subclavian 189               Multiphasic, WNL                      +----------+--------+--------+----------------+-------------------+ +---------+--------+--+--------+---------+  Vertebral PSV cm/s 44 EDV cm/s Antegrade  +---------+--------+--+--------+---------+ ICA/CCA ratio = 1.3  Summary: Right Carotid: Velocities in the right ICA are consistent with a 60-79%                stenosis. ICA stenosis by velocity suggests the high end of                40-59%, however by 2D area measurement it appears to be 65-70% in                the proximal ICA due to smooth heterogeneous plaque. Left Carotid: Velocities in the left ICA are consistent with a 1-39% stenosis.               Mild calcified plaque in the bulb/ICA. Vertebrals:  Bilateral vertebral arteries demonstrate antegrade flow. Subclavians: Normal flow hemodynamics were seen in bilateral subclavian              arteries. *See table(s) above for measurements and observations.  Electronically signed by Leotis Pain MD on 11/08/2019 at  5:25:10 PM.    Final  ASSESSMENT & PLAN:  1. 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[place])   2. History of head and neck cancer   3. Goals of care, counseling/discussion   Cancer Staging Malignant neoplasm of lower third of esophagus (HCC) Staging form: Esophagus - Adenocarcinoma, AJCC 8th Edition - Clinical: No stage assigned - Unsigned - Pathologic stage from 12/09/2019: Stage IIB (pT1b, pN1, cM0, G2) - Signed by Earlie Server, MD on 12/09/2019   #Esophageal adenocarcinoma, Stage IIB Pathology was reviewed and discussed with patient and his wife.  Lymph node positive diease, no neoadjuvant chemotherapy, recommend adjuvant chemotherapy.  The diagnosis and care plan were discussed with patient in detail. [facility] Dr.Uronis recommend chemotherapy with CapOx regimen for 6 months. No radiation was recommended due to the concern of dehiscence. .  Chemotherapy education was provided.Goal of care is to reduce risk of recurrence.  We had discussed the composition of chemotherapy regimen, length of chemo cycle, duration of treatment and the time to assess response to treatment.    I explained to the patient the risks and benefits of chemotherapy Capecitabine and Oxaliplatin including all but not limited to hair loss, mouth sore, nausea, vomiting, diarrhea, low blood counts, bleeding, neuropathy and risk of life threatening infection and even death, secondary malignancy etc.  . Patient voices understanding and willing to proceed chemotherapy.   # Chemotherapy education; Discussed about medi-port. Patient desires to try his peripheral vein first. . Antiemetics-Zofran and Compazine Rx were  sent to pharmacy  Will check his insurance coverage for Xeloda.  Supportive care measures are necessary for patient well-being and will be provided as necessary. We spent sufficient time to discuss many aspect of care, questions were answered to patient's satisfaction.  All questions were answered. The patient knows to call the  clinic with any problems questions or concerns.  cc Idelle Crouch, MD   Return of visit: hopefully to start during week of 12/19/2019  We spent sufficient time to discuss many aspect of care, questions were answered to patient's satisfaction.   Earlie Server, MD, PhD [facility]ology [facility] at [facility] Pager- [phone] 12/09/2019

## 2019-12-09 NOTE — Progress Notes (Signed)
DISCONTINUE ON PATHWAY REGIMEN - Gastroesophageal  No Medical Intervention - Off Treatment.  REASON: Other Reason PRIOR TREATMENT: Off Treatment  START OFF PATHWAY REGIMEN - Gastroesophageal   [id number]:Capecitabine + Oxaliplatin (1,000/130):   A cycle is every 21 days:     Capecitabine      Oxaliplatin   **Always confirm dose/schedule in your pharmacy ordering system**  Patient Characteristics: Esophageal & GE Junction, Adenocarcinoma, Postoperative without Neoadjuvant Therapy (Pathologic Staging), Adjuvant Therapy Histology: Adenocarcinoma Disease Classification: Esophageal Therapeutic Status: Postoperative without Neoadjuvant Therapy (Pathologic Staging) AJCC N Category: pN1 AJCC M Category: cM0 AJCC 8 Stage Grouping: IIB AJCC Grade: G2 AJCC T Category: pT1b Intent of Therapy: Curative Intent, Discussed with Patient

## 2019-12-12 ENCOUNTER — Telehealth: Payer: Self-pay

## 2019-12-12 ENCOUNTER — Inpatient Hospital Stay (HOSPITAL_BASED_OUTPATIENT_CLINIC_OR_DEPARTMENT_OTHER): Payer: Medicare Other | Admitting: Oncology

## 2019-12-12 ENCOUNTER — Telehealth: Payer: Self-pay | Admitting: Pharmacist

## 2019-12-12 ENCOUNTER — Telehealth: Payer: Self-pay | Admitting: Pharmacy Technician

## 2019-12-12 ENCOUNTER — Other Ambulatory Visit: Payer: Self-pay

## 2019-12-12 ENCOUNTER — Inpatient Hospital Stay: Payer: Medicare Other

## 2019-12-12 DIAGNOSIS — C159 Malignant neoplasm of esophagus, unspecified: Secondary | ICD-10-CM | POA: Diagnosis not present

## 2019-12-12 DIAGNOSIS — I6523 Occlusion and stenosis of bilateral carotid arteries: Secondary | ICD-10-CM

## 2019-12-12 MED ORDER — CAPECITABINE 500 MG PO TABS: 1500.0000 mg | ORAL_TABLET | Freq: Two times a day (BID) | ORAL | 0 refills | Status: DC

## 2019-12-12 MED ORDER — CAPECITABINE 150 MG PO TABS: 300.0000 mg | ORAL_TABLET | Freq: Two times a day (BID) | ORAL | 0 refills | Status: DC

## 2019-12-12 MED FILL — XELODA 500 MG TABLET: 500 | 21 days supply | Qty: 84 | Fill #0

## 2019-12-12 MED FILL — CAPECITABINE 150 MG TABS: 150 | 21 days supply | Qty: 56 | Fill #0

## 2019-12-12 NOTE — Telephone Encounter (Signed)
-----   Message from Earlie Server, MD sent at 12/12/2019 12:17 PM EDT ----- Thank you Allyson, please deliver his medication later this week.  Team, please schedule him to see me lab md oxaliplatin during week of 12/19/2019 He needs chemotherapy class done too. Thanks.  Please check with infusion to make sure Oxaliplatin is ok with peripheral IV. Thanks.- he prefers to try his vein first.  ----- Message ----- From: Darl Pikes, RPH-CPP Sent: 12/12/2019  10:13 AM EDT To: Sheliah Hatch, CPhT, Evelina Dun, RN, #  Dr. Tasia Catchings,  His Xeloda is covered by his insurance with a $0 copay. If we are able to get him on the phone today we can have medication to him by tomorrow 9/14.  Clearnce Sorrel  ----- Message ----- From: Earlie Server, MD Sent: 12/10/2019  10:50 AM EDT To: Evelina Dun, RN, Vanice Sarah, CMA, #  Alyson,  I plan to start CapOx regimen for Mr.Blankenhorn. Please look into his coverage. Rx is in [place].please proceed if covered. Let me know the delivery date. Will schedule his Oxalaplatin.   Talbert Cage

## 2019-12-12 NOTE — Telephone Encounter (Signed)
Oral Oncology Pharmacy Student Encounter  Received new prescription for Xeloda for the adjuvant treatment of stage IIB esophageal adenocarcinoma in conjunction with oxaliplatin, planned duration for 6 months.  CMP and CBC from 12/02/19 assessed, persistently elevated WBC and low Hb since May 2021, otherwise unremarkable. Prescription dose and frequency assessed.  Current medication list in Epic reviewed, one possible DDI with omeprazole identified: effects of Xeloda may be decreased by proton pump inhibitors, but no definitive evidence to support this.  Evaluated chart and no apparent patient barriers to medication adherence identified.   Prescription has been e-scribed to the [facility], benefits investigation revealed copay is $0.00.   Oral Oncology Clinic will continue to follow for insurance authorization, copayment issues, initial counseling and start date.  Lowella Fairy D Candidate 2022  [facility] Oral Chemotherapy Navigation Clinic [phone]  12/12/2019 10:01 AM

## 2019-12-12 NOTE — Telephone Encounter (Signed)
Oral Chemotherapy Pharmacy Student Encounter  Patient knows plan is to start Xeloda with IV infusion which is pending scheduling. [facility] will deliver Xeloda tomorrow.  Patient Education I spoke with patient for overview of new oral chemotherapy medication: Xeloda for the treatment of esophageal adenocarcinoma, planned duration for 6 months in conjunction with oxaliplatin.   Patient is doing well. Counseled patient on administration, dosing, side effects, monitoring, drug-food interactions, safe handling, storage, and disposal. Patient will take three 500 mg tablets and two 150 mg tablets (1,800 mg total) by mouth 2 (two) times daily. Take 14 days on, 7 days off, repeat every 21 days.   Side effects include but not limited to: low WBC, hand-foot syndrome, diarrhea, N/V, fatigue.    Reviewed with patient importance of keeping a medication schedule and plan for any missed doses. Patient expressed understanding.  After discussion with patient no patient barriers to medication adherence identified. He mentioned going on a month-long trip (05/01/20 - 05/29/20) and asked about being off medication for one month. Patient had already spoken with oncologist, but advised to revisit with oncologist closer to the trip.  Mr. Laviolette voiced understanding and appreciation. All questions answered.  Provided patient with [facility] phone number. Patient knows to call the office with questions or concerns. [facility] will continue to follow.   Laurey Arrow, PharmD Candidate 2022 [facility] [phone]  12/12/2019 11:25 AM

## 2019-12-12 NOTE — Telephone Encounter (Signed)
Oral Oncology Patient Advocate Encounter  After completing a benefits investigation, prior authorization for Xeloda is not required.   Patients Medicare Part B and supplement are to be billed per insurance.  Patient's copay is $0.00.    [place] [facility] [place] Phone [phone] Fax [phone] 12/12/2019 9:07 AM

## 2019-12-12 NOTE — Telephone Encounter (Signed)
Oral Oncology Patient Advocate Encounter  I spoke with Blake [name] this morning to set up delivery of Xeloda.  Address verified for shipment.  Xeloda will be filled through [facility] and mailed 12/12/19 for delivery 12/13/19.  Patient is aware to not start medication until next week.    [facility] will call 7-10 days before next refill is due to complete adherence call and set up delivery of medication.     [place] [facility] [place] Phone [phone] Fax [phone] 12/12/2019 12:22 PM

## 2019-12-12 NOTE — Telephone Encounter (Addendum)
Scheduling pool message sent:  Checked with infusion and it is OK to give Oxaliplatin peripheral.    Please schedule patient for chemo class and lab/MD/Oxaliplatin *new* on 12/19/2019.

## 2019-12-14 ENCOUNTER — Ambulatory Visit
Admission: RE | Admit: 2019-12-14 | Discharge: 2019-12-14 | Disposition: A | Discharge status: Home / Self Care | Payer: Medicare Other | Source: Ambulatory Visit | Attending: Student in an Organized Health Care Education/Training Program | Admitting: Student in an Organized Health Care Education/Training Program

## 2019-12-14 ENCOUNTER — Ambulatory Visit (INDEPENDENT_AMBULATORY_CARE_PROVIDER_SITE_OTHER): Payer: Medicare Other | Admitting: Dermatology

## 2019-12-14 ENCOUNTER — Ambulatory Visit (HOSPITAL_BASED_OUTPATIENT_CLINIC_OR_DEPARTMENT_OTHER): Payer: Medicare Other | Admitting: Student in an Organized Health Care Education/Training Program

## 2019-12-14 ENCOUNTER — Other Ambulatory Visit: Payer: Self-pay

## 2019-12-14 ENCOUNTER — Encounter: Payer: Self-pay | Admitting: Student in an Organized Health Care Education/Training Program

## 2019-12-14 ENCOUNTER — Encounter: Payer: Self-pay | Admitting: Dermatology

## 2019-12-14 VITALS — BP 173/75 | HR 73 | Temp 98.0°F | Resp 20 | Ht 65.0 in | Wt 164.0 lb

## 2019-12-14 DIAGNOSIS — G894 Chronic pain syndrome: Secondary | ICD-10-CM

## 2019-12-14 DIAGNOSIS — C44319 Basal cell carcinoma of skin of other parts of face: Secondary | ICD-10-CM | POA: Diagnosis not present

## 2019-12-14 DIAGNOSIS — M48062 Spinal stenosis, lumbar region with neurogenic claudication: Secondary | ICD-10-CM

## 2019-12-14 DIAGNOSIS — I6523 Occlusion and stenosis of bilateral carotid arteries: Secondary | ICD-10-CM

## 2019-12-14 DIAGNOSIS — M5416 Radiculopathy, lumbar region: Secondary | ICD-10-CM | POA: Diagnosis present

## 2019-12-14 DIAGNOSIS — L570 Actinic keratosis: Secondary | ICD-10-CM

## 2019-12-14 DIAGNOSIS — L820 Inflamed seborrheic keratosis: Secondary | ICD-10-CM

## 2019-12-14 DIAGNOSIS — D485 Neoplasm of uncertain behavior of skin: Secondary | ICD-10-CM

## 2019-12-14 DIAGNOSIS — C44729 Squamous cell carcinoma of skin of left lower limb, including hip: Secondary | ICD-10-CM | POA: Diagnosis not present

## 2019-12-14 DIAGNOSIS — L578 Other skin changes due to chronic exposure to nonionizing radiation: Secondary | ICD-10-CM

## 2019-12-14 DIAGNOSIS — C4492 Squamous cell carcinoma of skin, unspecified: Secondary | ICD-10-CM

## 2019-12-14 HISTORY — DX: Squamous cell carcinoma of skin, unspecified: C44.92

## 2019-12-14 MED ORDER — SODIUM CHLORIDE 0.9% FLUSH
2.0000 mL | Freq: Once | INTRAVENOUS | Status: AC
  Administered 2019-12-14: 10 mL

## 2019-12-14 MED ORDER — LIDOCAINE HCL 2 % IJ SOLN
20.0000 mL | Freq: Once | INTRAMUSCULAR | Status: AC
  Administered 2019-12-14: 200 mg
  Filled 2019-12-14: qty 10

## 2019-12-14 MED ORDER — IOHEXOL 180 MG/ML  SOLN
10.0000 mL | Freq: Once | INTRAMUSCULAR | Status: AC
  Administered 2019-12-14: 10 mL via EPIDURAL
  Filled 2019-12-14: qty 20

## 2019-12-14 MED ORDER — DEXAMETHASONE SODIUM PHOSPHATE 10 MG/ML IJ SOLN
10.0000 mg | Freq: Once | INTRAMUSCULAR | Status: AC
  Administered 2019-12-14: 10 mg
  Filled 2019-12-14: qty 1

## 2019-12-14 MED ORDER — ROPIVACAINE HCL 2 MG/ML IJ SOLN
2.0000 mL | Freq: Once | INTRAMUSCULAR | Status: AC
  Administered 2019-12-14: 10 mL via EPIDURAL
  Filled 2019-12-14: qty 10

## 2019-12-14 NOTE — Progress Notes (Signed)
Follow-Up Visit   Subjective  Blake [name] is a 74 y.o. male who presents for the following: Basal Cell Carcinoma (Biopsy proven of left zygoma - treat today), Follow-up (AK follow up of left ear treated with LN2), and Other (Spot of left lower leg that is tender and growing).  The following portions of the chart were reviewed this encounter and updated as appropriate:  Tobacco  Allergies  Meds  Problems  Med Hx  Surg Hx  Fam Hx     Review of Systems:  No other skin or systemic complaints except as noted in HPI or Assessment and Plan.  Objective  Well appearing patient in no apparent distress; mood and affect are within normal limits.  A focused examination was performed including face, legs. Relevant physical exam findings are noted in the Assessment and Plan.  Objective  Face/ears (4): Erythematous thin papules/macules with gritty scale.   Objective  Right Lower Leg - Anterior: Erythematous keratotic or waxy stuck-on papule or plaque.   Objective  Left Zygomatic Area: Healing biopsy site 1.2 cm  Objective  Left pretibial: 1.0 cm Hyperkeratotic papule   Assessment & Plan    Actinic Damage - diffuse scaly erythematous macules with underlying dyspigmentation - Recommend daily broad spectrum sunscreen SPF 30+ to sun-exposed areas, reapply every 2 hours as needed.  - Call for new or changing lesions.   AK (actinic keratosis) (4) Face/ears  Destruction of lesion - Face/ears Complexity: simple   Destruction method: cryotherapy   Informed consent: discussed and consent obtained   Timeout:  patient name, date of birth, surgical site, and procedure verified Lesion destroyed using liquid nitrogen: Yes   Region frozen until ice ball extended beyond lesion: Yes   Outcome: patient tolerated procedure well with no complications   Post-procedure details: wound care instructions given    Inflamed seborrheic keratosis Right Lower Leg - Anterior  Destruction of  lesion - Right Lower Leg - Anterior Complexity: simple   Destruction method: cryotherapy   Informed consent: discussed and consent obtained   Timeout:  patient name, date of birth, surgical site, and procedure verified Lesion destroyed using liquid nitrogen: Yes   Region frozen until ice ball extended beyond lesion: Yes   Outcome: patient tolerated procedure well with no complications   Post-procedure details: wound care instructions given    Basal cell carcinoma (BCC) of skin of other part of face Left Zygomatic Area  Epidermal / dermal shaving  Lesion diameter (cm):  1.2 Informed consent: discussed and consent obtained   Timeout: patient name, date of birth, surgical site, and procedure verified   Procedure prep:  Patient was prepped and draped in usual sterile fashion Prep type:  Isopropyl alcohol Anesthesia: the lesion was anesthetized in a standard fashion   Anesthetic:  1% lidocaine w/ epinephrine 1-100,000 buffered w/ 8.4% NaHCO3 Instrument used: flexible razor blade   Hemostasis achieved with: pressure, aluminum chloride and electrodesiccation   Outcome: patient tolerated procedure well   Post-procedure details: sterile dressing applied and wound care instructions given   Dressing type: bandage and petrolatum    Destruction of lesion Complexity: extensive   Destruction method: electrodesiccation and curettage   Informed consent: discussed and consent obtained   Timeout:  patient name, date of birth, surgical site, and procedure verified Procedure prep:  Patient was prepped and draped in usual sterile fashion Prep type:  Isopropyl alcohol Anesthesia: the lesion was anesthetized in a standard fashion   Anesthetic:  1% lidocaine w/ epinephrine  1-100,000 buffered w/ 8.4% NaHCO3 Curettage performed in three different directions: Yes   Electrodesiccation performed over the curetted area: Yes   Lesion length (cm):  1.2 Lesion width (cm):  1.2 Margin per side (cm):   0.2 Final wound size (cm):  1.6 Hemostasis achieved with:  pressure and aluminum chloride Outcome: patient tolerated procedure well with no complications   Post-procedure details: sterile dressing applied and wound care instructions given   Dressing type: bandage and petrolatum    Neoplasm of uncertain behavior of skin Left pretibial  Epidermal / dermal shaving  Lesion diameter (cm):  1 Informed consent: discussed and consent obtained   Timeout: patient name, date of birth, surgical site, and procedure verified   Procedure prep:  Patient was prepped and draped in usual sterile fashion Prep type:  Isopropyl alcohol Anesthesia: the lesion was anesthetized in a standard fashion   Anesthetic:  1% lidocaine w/ epinephrine 1-100,000 buffered w/ 8.4% NaHCO3 Instrument used: flexible razor blade   Hemostasis achieved with: pressure, aluminum chloride and electrodesiccation   Outcome: patient tolerated procedure well   Post-procedure details: sterile dressing applied and wound care instructions given   Dressing type: bandage and petrolatum    Destruction of lesion Complexity: extensive   Destruction method: electrodesiccation and curettage   Informed consent: discussed and consent obtained   Timeout:  patient name, date of birth, surgical site, and procedure verified Procedure prep:  Patient was prepped and draped in usual sterile fashion Prep type:  Isopropyl alcohol Anesthesia: the lesion was anesthetized in a standard fashion   Anesthetic:  1% lidocaine w/ epinephrine 1-100,000 buffered w/ 8.4% NaHCO3 Curettage performed in three different directions: Yes   Electrodesiccation performed over the curetted area: Yes   Lesion length (cm):  1 Lesion width (cm):  1 Margin per side (cm):  0.2 Final wound size (cm):  1.4 Hemostasis achieved with:  pressure and aluminum chloride Outcome: patient tolerated procedure well with no complications   Post-procedure details: sterile dressing  applied and wound care instructions given   Dressing type: bandage and petrolatum    Specimen 1 - Surgical pathology Differential Diagnosis: SCC vs other  Check Margins: No 1.0 cm Hyperkeratotic papule EDC today  SCC (squamous cell carcinoma), leg, left  Return in about 4 months (around 04/14/2020).  I, Ashok Cordia, CMA, am acting as scribe for Sarina Ser, MD .  Documentation: I have reviewed the above documentation for accuracy and completeness, and I agree with the above.  Sarina Ser, MD

## 2019-12-14 NOTE — Progress Notes (Signed)
Safety precautions to be maintained throughout the outpatient stay will include: orient to surroundings, keep bed in low position, maintain call bell within reach at all times, provide assistance with transfer out of bed and ambulation.  

## 2019-12-14 NOTE — Patient Instructions (Signed)

## 2019-12-14 NOTE — Patient Instructions (Signed)

## 2019-12-14 NOTE — Progress Notes (Signed)
PROVIDER NOTE: Information contained herein reflects review and annotations entered in association with encounter. Interpretation of such information and data should be left to medically-trained personnel. Information provided to patient can be located elsewhere in the medical record under "Patient Instructions". Document created using STT-dictation technology, any transcriptional errors that may result from process are unintentional.    Patient: Blake [name]  Service Category: Procedure  Provider: Gillis Santa, MD  DOB: 1945-08-02  DOS: 12/14/2019  Location: [facility]  MRN: [id number]  Setting: Ambulatory - outpatient  Referring Provider: Idelle Crouch, MD  Type: Established Patient  Specialty: Interventional Pain Management  PCP: Idelle Crouch, MD   Primary Reason for Visit: Interventional Pain Management Treatment. CC: Back Pain (low and left) and Leg Pain (left and posterior to just past  knee)  Procedure:          Anesthesia, Analgesia, Anxiolysis:  Type: Diagnostic Epidural Steroid Injection #1  Region: Caudal Level: Sacrococcygeal   Laterality: Midline aiming at the left  Type: Local Anesthesia  Local Anesthetic: Lidocaine 1-2%  Position: Prone   Indications: 1. Lumbar radiculopathy   2. Spinal stenosis, lumbar region, with neurogenic claudication   3. Chronic pain syndrome    Pain Score: Pre-procedure: 10-Worst pain ever/10 Post-procedure: 6 /10   Pre-op Assessment:  Blake [name] is a 74 y.o. (year old), male patient, seen today for interventional treatment. He  has a past surgical history that includes Partial hip arthroplasty (2014); Tongue Biopsy (2006); Port-a-cath removal; Colonoscopy (12/08/2003); Tonsillectomy; Upper gi endoscopy (01/25/14); Lumbar laminectomy/decompression microdiscectomy (N/A, 03/03/2018); Joint replacement (Right); Trigger finger release (Right, 2013); Colonoscopy with propofol (N/A, 03/21/2019); Esophagogastroduodenoscopy (egd)  with propofol (N/A, 03/21/2019); esophogeal cancer; and Abdominal surgery. Blake [name] has a current medication list which includes the following prescription(s): amiodarone, aspirin ec, atorvastatin, one touch ultra system kit, capecitabine, capecitabine, gabapentin, ibuprofen, methocarbamol, metoprolol succinate, omeprazole, ondansetron, prochlorperazine, tamsulosin, cephalexin, glimepiride, lisinopril, meloxicam, mupirocin ointment, omega-3, oxycodone, and polyethylene glycol powder. His primarily concern today is the Back Pain (low and left) and Leg Pain (left and posterior to just past  knee)  Initial Vital Signs:  Pulse/HCG Rate: 73ECG Heart Rate: 73 (nsr) Temp: 98 F (36.7 C) Resp: 18 BP: (!) 171/73 SpO2: 99 %  BMI: Estimated body mass index is 27.29 kg/m as calculated from the following:   Height as of this encounter: '5\' 5"'  (1.651 m).   Weight as of this encounter: 164 lb (74.4 kg).  Risk Assessment: Allergies: Reviewed. He is allergic to sulfa antibiotics.  Allergy Precautions: None required Coagulopathies: Reviewed. None identified.  Blood-thinner therapy: None at this time Active Infection(s): Reviewed. None identified. Blake [name] is afebrile  Site Confirmation: Blake [name] was asked to confirm the procedure and laterality before marking the site Procedure checklist: Completed Consent: Before the procedure and under the influence of no sedative(s), amnesic(s), or anxiolytics, the patient was informed of the treatment options, risks and possible complications. To fulfill our ethical and legal obligations, as recommended by the American Medical Association's Code of Ethics, I have informed the patient of my clinical impression; the nature and purpose of the treatment or procedure; the risks, benefits, and possible complications of the intervention; the alternatives, including doing nothing; the risk(s) and benefit(s) of the alternative treatment(s) or procedure(s); and the risk(s) and  benefit(s) of doing nothing. The patient was provided information about the general risks and possible complications associated with the procedure. These may include, but are not limited to: failure to achieve  desired goals, infection, bleeding, organ or nerve damage, allergic reactions, paralysis, and death. In addition, the patient was informed of those risks and complications associated to Spine-related procedures, such as failure to decrease pain; infection (i.e.: Meningitis, epidural or intraspinal abscess); bleeding (i.e.: epidural hematoma, subarachnoid hemorrhage, or any other type of intraspinal or peri-dural bleeding); organ or nerve damage (i.e.: Any type of peripheral nerve, nerve root, or spinal cord injury) with subsequent damage to sensory, motor, and/or autonomic systems, resulting in permanent pain, numbness, and/or weakness of one or several areas of the body; allergic reactions; (i.e.: anaphylactic reaction); and/or death. Furthermore, the patient was informed of those risks and complications associated with the medications. These include, but are not limited to: allergic reactions (i.e.: anaphylactic or anaphylactoid reaction(s)); adrenal axis suppression; blood sugar elevation that in diabetics may result in ketoacidosis or comma; water retention that in patients with history of congestive heart failure may result in shortness of breath, pulmonary edema, and decompensation with resultant heart failure; weight gain; swelling or edema; medication-induced neural toxicity; particulate matter embolism and blood vessel occlusion with resultant organ, and/or nervous system infarction; and/or aseptic necrosis of one or more joints. Finally, the patient was informed that Medicine is not an exact science; therefore, there is also the possibility of unforeseen or unpredictable risks and/or possible complications that may result in a catastrophic outcome. The patient indicated having understood very  clearly. We have given the patient no guarantees and we have made no promises. Enough time was given to the patient to ask questions, all of which were answered to the patient's satisfaction. Mr. Keimig has indicated that he wanted to continue with the procedure. Attestation: I, the ordering provider, attest that I have discussed with the patient the benefits, risks, side-effects, alternatives, likelihood of achieving goals, and potential problems during recovery for the procedure that I have provided informed consent. Date  Time: 12/14/2019  9:09 AM  Pre-Procedure Preparation:  Monitoring: As per clinic protocol. Respiration, ETCO2, SpO2, BP, heart rate and rhythm monitor placed and checked for adequate function Safety Precautions: Patient was assessed for positional comfort and pressure points before starting the procedure. Time-out: I initiated and conducted the "Time-out" before starting the procedure, as per protocol. The patient was asked to participate by confirming the accuracy of the "Time Out" information. Verification of the correct person, site, and procedure were performed and confirmed by me, the nursing staff, and the patient. "Time-out" conducted as per Joint Commission's Universal Protocol (UP.01.01.01). Time: 0951  Description of Procedure:          Target Area: Caudal Epidural Canal. Approach: Midline approach. Area Prepped: Entire Posterior Sacrococcygeal Region DuraPrep (Iodine Povacrylex [0.7% available iodine] and Isopropyl Alcohol, 74% w/w) Safety Precautions: Aspiration looking for blood return was conducted prior to all injections. At no point did we inject any substances, as a needle was being advanced. No attempts were made at seeking any paresthesias. Safe injection practices and needle disposal techniques used. Medications properly checked for expiration dates. SDV (single dose vial) medications used. Description of the Procedure: Protocol guidelines were followed. The  patient was placed in position over the fluoroscopy table. The target area was identified and the area prepped in the usual manner. Skin & deeper tissues infiltrated with local anesthetic. Appropriate amount of time allowed to pass for local anesthetics to take effect. The procedure needles were then advanced to the target area. Proper needle placement secured. Negative aspiration confirmed. Solution injected in intermittent fashion, asking for systemic symptoms  every 0.5cc of injectate. The needles were then removed and the area cleansed, making sure to leave some of the prepping solution back to take advantage of its long term bactericidal properties. Vitals:   12/14/19 0916 12/14/19 0955 12/14/19 1000  BP: (!) 171/73 (!) 188/66 (!) 173/75  Pulse: 73    Resp: '18 20 20  ' Temp: 98 F (36.7 C)    TempSrc: Temporal    SpO2: 99% 98% 99%  Weight: 164 lb (74.4 kg)    Height: '5\' 5"'  (1.651 m)      Start Time: 0952 hrs. End Time: 1001 hrs. Materials:  Needle(s) Type: Epidural needle Gauge: 22G Length: 3.5-in Medication(s): Please see orders for medications and dosing details. 6cc solution made of 3 cc of preservative-free saline, 2 cc of 0.2% ropivacaine, 1 cc of Decadron 10 mg/cc.  Imaging Guidance (Spinal):          Type of Imaging Technique: Fluoroscopy Guidance (Spinal) Indication(s): Assistance in needle guidance and placement for procedures requiring needle placement in or near specific anatomical locations not easily accessible without such assistance. Exposure Time: Please see nurses notes. Contrast: Before injecting any contrast, we confirmed that the patient did not have an allergy to iodine, shellfish, or radiological contrast. Once satisfactory needle placement was completed at the desired level, radiological contrast was injected. Contrast injected under live fluoroscopy. No contrast complications. See chart for type and volume of contrast used. Fluoroscopic Guidance: I was personally  present during the use of fluoroscopy. "Tunnel Vision Technique" used to obtain the best possible view of the target area. Parallax error corrected before commencing the procedure. "Direction-depth-direction" technique used to introduce the needle under continuous pulsed fluoroscopy. Once target was reached, antero-posterior, oblique, and lateral fluoroscopic projection used confirm needle placement in all planes. Images permanently stored in EMR. Interpretation: I personally interpreted the imaging intraoperatively. Adequate needle placement confirmed in multiple planes. Appropriate spread of contrast into desired area was observed. No evidence of afferent or efferent intravascular uptake. No intrathecal or subarachnoid spread observed. Permanent images saved into the patient's record.  Antibiotic Prophylaxis:   Anti-infectives (From admission, onward)   None     Indication(s): None identified  Post-operative Assessment:  Post-procedure Vital Signs:  Pulse/HCG Rate: 7371 Temp: 98 F (36.7 C) Resp: 20 BP: (!) 173/75 SpO2: 99 %  EBL: None  Complications: No immediate post-treatment complications observed by team, or reported by patient.  Note: The patient tolerated the entire procedure well. A repeat set of vitals were taken after the procedure and the patient was kept under observation following institutional policy, for this type of procedure. Post-procedural neurological assessment was performed, showing return to baseline, prior to discharge. The patient was provided with post-procedure discharge instructions, including a section on how to identify potential problems. Should any problems arise concerning this procedure, the patient was given instructions to immediately contact us, at any time, without hesitation. In any case, we plan to contact the patient by telephone for a follow-up status report regarding this interventional procedure.  Comments:  No additional relevant  information.  Plan of Care  Orders:  Orders Placed This Encounter  Procedures  . DG PAIN CLIN[facility] C-ARM 1-60 MIN NO REPORT    Intraoperative interpretation by procedural physician at [facility].    Standing Status:   Standing    Number of Occurrences:   1    Order Specific Question:   Reason for exam:    Answer:   Assistance in needle guidance and  placement for procedures requiring needle placement in or near specific anatomical locations not easily accessible without such assistance.   Medications ordered for procedure: Meds ordered this encounter  Medications  . iohexol (OMNIPAQUE) 180 MG/ML injection 10 mL    Must be Myelogram-compatible. If not available, you may substitute with a water-soluble, non-ionic, hypoallergenic, myelogram-compatible radiological contrast medium.  [name] Kitchen lidocaine (XYLOCAINE) 2 % (with pres) injection 400 mg  . ropivacaine (PF) 2 mg/mL (0.2%) (NAROPIN) injection 2 mL  . sodium chloride flush (NS) 0.9 % injection 2 mL  . dexamethasone (DECADRON) injection 10 mg   Medications administered: We administered iohexol, lidocaine, ropivacaine (PF) 2 mg/mL (0.2%), sodium chloride flush, and dexamethasone.  See the medical record for exact dosing, route, and time of administration.  Follow-up plan:   Return in about 4 weeks (around 01/11/2020) for Post Procedure Evaluation, in person.      Series of 3 L4-L5 epidural steroid injections followed by L3-L4 and L4-L5 microdiscectomy and laminectomy by Dr. Cari Caraway December 2019.  Return of left radicular pain given immobility after surgery for esophageal cancer.  caudal ESI, left 12/14/2019.    Recent Visits Date Type Provider Dept  12/08/19 Office Visit Gillis Santa, MD [facility]  Showing recent visits within past 90 days and meeting all other requirements Today's Visits Date Type Provider Dept  12/14/19 Procedure visit Gillis Santa, MD [facility]  Showing today's visits and meeting  all other requirements Future Appointments Date Type Provider Dept  01/04/20 Appointment Gillis Santa, MD [facility]  Showing future appointments within next 90 days and meeting all other requirements  Disposition: Discharge home  Discharge (Date  Time): 12/14/2019; 1010 hrs.   Primary Care Physician: Idelle Crouch, MD Location: [facility] Note by: Gillis Santa, MD Date: 12/14/2019; Time: 12:30 PM  Disclaimer:  Medicine is not an exact science. The only guarantee in medicine is that nothing is guaranteed. It is important to note that the decision to proceed with this intervention was based on the information collected from the patient. The Data and conclusions were drawn from the patient's questionnaire, the interview, and the physical examination. Because the information was provided in large part by the patient, it cannot be guaranteed that it has not been purposely or unconsciously manipulated. Every effort has been made to obtain as much relevant data as possible for this evaluation. It is important to note that the conclusions that lead to this procedure are derived in large part from the available data. Always take into account that the treatment will also be dependent on availability of resources and existing treatment guidelines, considered by other Pain Management Practitioners as being common knowledge and practice, at the time of the intervention. For Medico-Legal purposes, it is also important to point out that variation in procedural techniques and pharmacological choices are the acceptable norm. The indications, contraindications, technique, and results of the above procedure should only be interpreted and judged by a Board-Certified Interventional Pain Specialist with extensive familiarity and expertise in the same exact procedure and technique.

## 2019-12-15 ENCOUNTER — Encounter: Payer: Self-pay | Admitting: Oncology

## 2019-12-15 ENCOUNTER — Ambulatory Visit: Payer: Medicare Other | Admitting: Oncology

## 2019-12-16 NOTE — Progress Notes (Signed)
[place]  Telephone:([phone] [phone]  Patient Care Team: Idelle Crouch, MD as PCP - General (Internal Medicine) Christene Lye, MD (General Surgery) Clent Jacks, RN as Oncology Nurse Navigator   Name of the patient: Blake [name]  [id number]  1946-03-03   Date of visit: 12/16/19  Diagnosis-esophageal cancer  Chief complaint/Reason for visit- Initial Meeting for [facility], preparing for starting chemotherapy  Heme/Onc history:  Oncology History  Malignant neoplasm of lower third of esophagus ([place])  07/14/2019 Initial Diagnosis   Malignant neoplasm of lower third of esophagus ([place])   12/09/2019 Cancer Staging   Staging form: Esophagus - Adenocarcinoma, AJCC 8th Edition - Pathologic stage from 12/09/2019: Stage IIB (pT1b, pN1, cM0, G2) - Signed by Earlie Server, MD on 12/09/2019   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[place])  12/09/2019 Initial Diagnosis   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[place])   12/19/2019 -  Chemotherapy   The patient had capecitabine (XELODA) 150 MG tablet, 300 mg (100 % of original dose 300 mg), Oral, 2 times daily, 1 of 1 cycle, Start date: 12/12/2019, End date: -- Dose modification: 300 mg (original dose 300 mg, Cycle 0) capecitabine (XELODA) 500 MG tablet, 1,500 mg (100 % of original dose 1,500 mg), Oral, 2 times daily, 1 of 1 cycle, Start date: 12/12/2019, End date: -- Dose modification: 1,500 mg (original dose 1,500 mg, Cycle 0) palonosetron (ALOXI) injection 0.25 mg, 0.25 mg, Intravenous,  Once, 0 of 8 cycles oxaliplatin (ELOXATIN) 240 mg in dextrose 5 % 500 mL chemo infusion, 130 mg/m2, Intravenous,  Once, 0 of 8 cycles  for chemotherapy treatment.      Interval history-[name] is a 74 year old male who presents to chemo care clinic today for initial meeting in preparation for starting chemotherapy. I introduced the chemo care clinic and we discussed that the role of the clinic is  to assist those who are at an increased risk of emergency room visits and/or complications during the course of chemotherapy treatment. We discussed that the increased risk takes into account factors such as age, performance status, and co-morbidities. We also discussed that for some, this might include barriers to care such as not having a primary care provider, lack of insurance/transportation, or not being able to afford medications. We discussed that the goal of the program is to help prevent unplanned ER visits and help reduce complications during chemotherapy. We do this by discussing specific risk factors to each individual and identifying ways that we can help improve these risk factors and reduce barriers to care.   ECOG FS:1 - Symptomatic but completely ambulatory  Review of systems- Review of Systems  Constitutional: Negative.  Negative for chills, fever, malaise/fatigue and weight loss.  HENT: Negative for congestion, ear pain and tinnitus.   Eyes: Negative.  Negative for blurred vision and double vision.  Respiratory: Negative.  Negative for cough, sputum production and shortness of breath.   Cardiovascular: Negative.  Negative for chest pain, palpitations and leg swelling.  Gastrointestinal: Negative.  Negative for abdominal pain, constipation, diarrhea, nausea and vomiting.  Genitourinary: Negative for dysuria, frequency and urgency.  Musculoskeletal: Negative for back pain and falls.  Skin: Negative.  Negative for rash.  Neurological: Negative.  Negative for weakness and headaches.  Endo/Heme/Allergies: Negative.  Does not bruise/bleed easily.  Psychiatric/Behavioral: Negative.  Negative for depression. The patient is not nervous/anxious and does not have insomnia.      Current treatment-Capox x6 months  Allergies  Allergen  Reactions  . Sulfa Antibiotics Rash    Past Medical History:  Diagnosis Date  . Arthritis   . Back pain    with leg pain  . Basal cell carcinoma  09/04/2009   right cheek 5.5 cm ant to earlobe, right ant nasal alar rim  . Basal cell carcinoma 07/31/2014   right mid brow  . Basal cell carcinoma 07/30/2016   right ant nasal alar rim at ant edge of BCC scar  . 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 Coronary artery disease   . Dyspnea   . GERD (gastroesophageal reflux disease)   . Hyperlipidemia   . Hypertension   . Numbness and tingling of both lower extremities    with positioning  . Prostate enlargement     Past Surgical History:  Procedure Laterality Date  . ABDOMINAL SURGERY    . COLONOSCOPY  12/08/2003  . COLONOSCOPY WITH PROPOFOL N/A 03/21/2019   Procedure: COLONOSCOPY WITH PROPOFOL;  Surgeon: Toledo, Benay Pike, MD;  Location: [facility] ENDOSCOPY;  Service: Gastroenterology;  Laterality: N/A;  . ESOPHAGOGASTRODUODENOSCOPY (EGD) WITH PROPOFOL N/A 03/21/2019   Procedure: ESOPHAGOGASTRODUODENOSCOPY (EGD) WITH PROPOFOL;  Surgeon: Toledo, Benay Pike, MD;  Location: [facility] ENDOSCOPY;  Service: Gastroenterology;  Laterality: N/A;  . esophogeal cancer    . JOINT REPLACEMENT Right    total hip  . LUMBAR LAMINECTOMY/DECOMPRESSION MICRODISCECTOMY N/A 03/03/2018   Procedure: LUMBAR LAMINECTOMY/DECOMPRESSION MICRODISCECTOMY 1 LEVEL-L3-4,L4-5;  Surgeon: Meade Maw, MD;  Location: [facility] ORS;  Service: Neurosurgery;  Laterality: N/A;  . PARTIAL HIP ARTHROPLASTY  2014  . PORT-A-CATH REMOVAL    . TONGUE BIOPSY  2006  . TONSILLECTOMY    . TRIGGER FINGER RELEASE Right 2013  . UPPER GI ENDOSCOPY  01/25/14   multiple gastric polyps    Social History   Socioeconomic History  . Marital status: Married    Spouse name: Not on file  . Number of children: Not on file  . Years of education: Not on file  . Highest education level: Not on file  Occupational History  . Not on file  Tobacco Use  . Smoking status: Former Smoker    Packs/day: 1.00    Years: 30.00    Pack years: 30.00     Types: Cigarettes    Quit date: 02/18/1994    Years since quitting: 25.8  . Smokeless tobacco: Never Used  Vaping Use  . Vaping Use: Never used  Substance and Sexual Activity  . Alcohol use: Yes  . Drug use: No  . Sexual activity: Yes  Other Topics Concern  . Not on file  Social History Narrative  . Not on file   Social Determinants of Health   Financial Resource Strain:   . Difficulty of Paying Living Expenses: Not on file  Food Insecurity:   . Worried About Charity fundraiser in the Last Year: Not on file  . Ran Out of Food in the Last Year: Not on file  Transportation Needs:   . Lack of Transportation (Medical): Not on file  . Lack of Transportation (Non-Medical): Not on file  Physical Activity:   . Days of Exercise per Week: Not on file  . Minutes of Exercise per Session: Not on file  Stress:   . Feeling of Stress : Not on file  Social Connections:   . Frequency of Communication with Friends and Family: Not on file  . Frequency of Social Gatherings with Friends and  Family: Not on file  . Attends Religious Services: Not on file  . Active Member of Clubs or Organizations: Not on file  . Attends Archivist Meetings: Not on file  . Marital Status: Not on file  Intimate Partner Violence:   . Fear of Current or Ex-Partner: Not on file  . Emotionally Abused: Not on file  . Physically Abused: Not on file  . Sexually Abused: Not on file    Family History  Problem Relation Age of Onset  . Diabetes Mother   . Lung cancer Father      Current Outpatient Medications:  .  amiodarone (PACERONE) 200 MG tablet, Take 200 mg by mouth daily., Disp: , Rfl:  .  aspirin EC 81 MG tablet, Take 81 mg by mouth daily., Disp: , Rfl:  .  atorvastatin (LIPITOR) 80 MG tablet, Take 80 mg by mouth daily. , Disp: , Rfl: 2 .  Blood Glucose Monitoring Suppl (ONE TOUCH ULTRA SYSTEM KIT) w/Device KIT, , Disp: , Rfl:  .  capecitabine (XELODA) 150 MG tablet, Take 2 tablets (300 mg total)  by mouth 2 (two) times daily. Take 14 days on, 7 days off, repeat every 21 days. Take with 500 mg tablets., Disp: 56 tablet, Rfl: 0 .  capecitabine (XELODA) 500 MG tablet, Take 3 tablets (1,500 mg total) by mouth 2 (two) times daily. Take 14 days on, 7 days off, repeat every 21 days. Take along with 150 mg tablets., Disp: 84 tablet, Rfl: 0 .  cephALEXin (KEFLEX) 500 MG capsule, Take 500 mg by mouth 4 (four) times daily. (Patient not taking: Reported on 12/09/2019), Disp: , Rfl:  .  gabapentin (NEURONTIN) 300 MG capsule, prn, Disp: , Rfl:  .  glimepiride (AMARYL) 4 MG tablet, Take 4 mg by mouth daily with breakfast. (Patient not taking: Reported on 12/09/2019), Disp: , Rfl:  .  ibuprofen (ADVIL) 800 MG tablet, Take 800 mg by mouth every 8 (eight) hours as needed., Disp: , Rfl:  .  lisinopril (PRINIVIL,ZESTRIL) 10 MG tablet, Take 10 mg by mouth 2 (two) times daily.  (Patient not taking: Reported on 12/09/2019), Disp: , Rfl:  .  meloxicam (MOBIC) 15 MG tablet, Take 15 mg by mouth daily. (Patient not taking: Reported on 12/09/2019), Disp: , Rfl:  .  methocarbamol (ROBAXIN) 500 MG tablet, Take 1 tablet (500 mg total) by mouth every 6 (six) hours as needed for muscle spasms., Disp: 90 tablet, Rfl: 0 .  metoprolol succinate (TOPROL-XL) 50 MG 24 hr tablet, Take 50 mg by mouth daily. , Disp: , Rfl:  .  mupirocin ointment (BACTROBAN) 2 %, Apply 1 application topically daily. Qd to aa back until clear (Patient not taking: Reported on 12/09/2019), Disp: 22 g, Rfl: 1 .  Omega-3 1000 MG CAPS, Take by mouth. (Patient not taking: Reported on 12/09/2019), Disp: , Rfl:  .  omeprazole (PRILOSEC) 40 MG capsule, Take 40 mg by mouth in the morning and at bedtime. , Disp: , Rfl: 3 .  ondansetron (ZOFRAN) 8 MG tablet, Take 1 tablet (8 mg total) by mouth 2 (two) times daily as needed for refractory nausea / vomiting. Start on day 3 after chemotherapy., Disp: 30 tablet, Rfl: 1 .  oxyCODONE (OXY IR/ROXICODONE) 5 MG immediate release  tablet, Take 1 tablet (5 mg total) by mouth every 4 (four) hours as needed for moderate pain ((score 4 to 6)). (Patient not taking: Reported on 12/08/2019), Disp: 30 tablet, Rfl: 0 .  polyethylene glycol powder (GLYCOLAX/MIRALAX)  powder, 255 grams one bottle for colonoscopy prep (Patient not taking: Reported on 12/09/2019), Disp: 255 g, Rfl: 0 .  prochlorperazine (COMPAZINE) 10 MG tablet, Take 1 tablet (10 mg total) by mouth every 6 (six) hours as needed (Nausea or vomiting)., Disp: 30 tablet, Rfl: 1 .  tamsulosin (FLOMAX) 0.4 MG CAPS capsule, Take 0.4 mg by mouth daily after supper. , Disp: , Rfl:   Physical exam: There were no vitals filed for this visit. Physical Exam Constitutional:      Appearance: Normal appearance.  HENT:     Head: Normocephalic and atraumatic.  Eyes:     Pupils: Pupils are equal, round, and reactive to light.  Cardiovascular:     Rate and Rhythm: Normal rate and regular rhythm.     Heart sounds: Normal heart sounds. No murmur heard.   Pulmonary:     Effort: Pulmonary effort is normal.     Breath sounds: Normal breath sounds. No wheezing.  Abdominal:     General: Bowel sounds are normal. There is no distension.     Palpations: Abdomen is soft.     Tenderness: There is no abdominal tenderness.  Musculoskeletal:        General: Normal range of motion.     Cervical back: Normal range of motion.  Skin:    General: Skin is warm and dry.     Findings: No rash.  Neurological:     Mental Status: He is alert and oriented to person, place, and time.  Psychiatric:        Judgment: Judgment normal.      CMP Latest Ref Rng & Units 04/11/2019  Glucose 70 - 99 mg/dL 199(H)  BUN 8 - 23 mg/dL 18  Creatinine 0.61 - 1.24 mg/dL 1.04  Sodium 135 - 145 mmol/L 138  Potassium 3.5 - 5.1 mmol/L 4.5  Chloride 98 - 111 mmol/L 103  CO2 22 - 32 mmol/L 25  Calcium 8.9 - 10.3 mg/dL 8.8(L)  Total Protein 6.5 - 8.1 g/dL 7.2  Total Bilirubin 0.3 - 1.2 mg/dL 0.6  Alkaline Phos 38 -  126 U/L 91  AST 15 - 41 U/L 23  ALT 0 - 44 U/L 37   CBC Latest Ref Rng & Units 04/11/2019  WBC 4.0 - 10.5 K/uL 6.6  Hemoglobin 13.0 - 17.0 g/dL 15.3  Hematocrit 39 - 52 % 48.8  Platelets 150 - 400 K/uL 219    No images are attached to the encounter.  DG PAIN CLINIC C-ARM 1-60 MIN NO REPORT  Result Date: 12/14/2019 Fluoro was used, but no Radiologist interpretation will be provided. Please refer to "NOTES" tab for provider progress note.    Assessment and plan- Patient is a 74 y.o. male who presents to [facility] for initial meeting in preparation for starting chemotherapy for the treatment of esophageal cancer.   1. HPI: Mr. Lieurance is a 74 year old male with past medical history significant for arthritis, basal cell carcinoma, hyperlipidemia, GERD, hypertension and enlarged prostate who was recently evaluated by Dr. Doy Hutching for longstanding Barrett's esophagus history.  Has history of tongue cancer back in 2006 status post chemoradiation with Dr. Jeb Levering.  In January 2021 CT chest abdomen pelvis did not reveal any evidence of metastatic disease in the chest abdomen or pelvis.  No esophageal mass was evident.  Per EMR review from [place] patient had EGD and biopsy which showed adenocarcinoma of the esophagus.  Underwent esophagectomy on 09/12/2019.  Plan is to begin Capox (Xeloda plus oxaliplatin)  x6 months.  2. Chemo Care Clinic/High Risk for ER/Hospitalization during chemotherapy- We discussed the role of the chemo care clinic and identified patient specific risk factors. I discussed that patient was identified as high risk primarily based on: Age and comorbidities.  Patient has past medical history positive for: Past Medical History:  Diagnosis Date  . Arthritis   . Back pain    with leg pain  . Basal cell carcinoma 09/04/2009   right cheek 5.5 cm ant to earlobe, right ant nasal alar rim  . Basal cell carcinoma 07/31/2014   right mid brow  . Basal cell carcinoma 07/30/2016    right ant nasal alar rim at ant edge of BCC scar  . 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 Coronary artery disease   . Dyspnea   . GERD (gastroesophageal reflux disease)   . Hyperlipidemia   . Hypertension   . Numbness and tingling of both lower extremities    with positioning  . Prostate enlargement     Patient has past surgical history positive for: Past Surgical History:  Procedure Laterality Date  . ABDOMINAL SURGERY    . COLONOSCOPY  12/08/2003  . COLONOSCOPY WITH PROPOFOL N/A 03/21/2019   Procedure: COLONOSCOPY WITH PROPOFOL;  Surgeon: Toledo, Benay Pike, MD;  Location: [facility] ENDOSCOPY;  Service: Gastroenterology;  Laterality: N/A;  . ESOPHAGOGASTRODUODENOSCOPY (EGD) WITH PROPOFOL N/A 03/21/2019   Procedure: ESOPHAGOGASTRODUODENOSCOPY (EGD) WITH PROPOFOL;  Surgeon: Toledo, Benay Pike, MD;  Location: [facility] ENDOSCOPY;  Service: Gastroenterology;  Laterality: N/A;  . esophogeal cancer    . JOINT REPLACEMENT Right    total hip  . LUMBAR LAMINECTOMY/DECOMPRESSION MICRODISCECTOMY N/A 03/03/2018   Procedure: LUMBAR LAMINECTOMY/DECOMPRESSION MICRODISCECTOMY 1 LEVEL-L3-4,L4-5;  Surgeon: Meade Maw, MD;  Location: [facility] ORS;  Service: Neurosurgery;  Laterality: N/A;  . PARTIAL HIP ARTHROPLASTY  2014  . PORT-A-CATH REMOVAL    . TONGUE BIOPSY  2006  . TONSILLECTOMY    . TRIGGER FINGER RELEASE Right 2013  . UPPER GI ENDOSCOPY  01/25/14   multiple gastric polyps    Based on our high risk symptom management report; this patient has a high risk of ED utilization.  The percentage below indicates how "at risk "  this patient based on the factors in this table within one year.   General Risk Score: 5  Values used to calculate this score:   Points  Metrics      1        Age: 14      1        Hospital Admissions: 1      2        ED Visits: 2      0        Has Chronic Obstructive Pulmonary Disease: No      1        Has Diabetes:  Yes      0        Has Congestive Heart Failure: No      0        Has liver disease: No      0        Has Depression: No      0        Current PCP: Idelle Crouch, MD      0        Has Medicaid: No    3. We discussed that social determinants of health  may have significant impacts on health and outcomes for cancer patients.  Today we discussed specific social determinants of performance status, alcohol use, depression, financial needs, food insecurity, housing, interpersonal violence, social connections, stress, tobacco use, and transportation.    After lengthy discussion the following were identified as areas of need: None at this time  Outpatient services: We discussed options including home based and outpatient services, DME and care program. We discusssed that patients who participate in regular physical activity report fewer negative impacts of cancer and treatments and report less fatigue.   Financial Concerns: We discussed that living with cancer can create tremendous financial burden.  We discussed options for assistance. I asked that if assistance is needed in affording medications or paying bills to please let us know so that we can provide assistance. We discussed options for food including social services, [employer] ($50 every 2 weeks) and onsite food pantry.  We will also notify [facility] to see if cancer center can provide additional support.  Referral to Social work: Introduced Education officer, museum Elease Etienne and the services he can provide such as support with [employer], cell phone and gas vouchers.   Support groups: We discussed options for support groups at the cancer center. If interested, please notify nurse navigator to enroll. We discussed options for managing stress including healthy eating, exercise as well as participating in no charge counseling services at the cancer center and support groups.  If these are of interest, patient can notify either myself or  primary nursing team.We discussed options for management including medications and referral to quit Smart program  Transportation: We discussed options for transportation including acta, paratransit, bus routes, link transit, taxi/uber/lyft, and cancer center [place].  I have notified primary oncology team who will help assist with arranging Lucianne Lei transportation for appointments when/if needed. We also discussed options for transportation on short notice/acute visits.  Palliative care services: We have palliative care services available in the cancer center to discuss goals of care and advanced care planning.  Please let us know if you have any questions or would like to speak to our palliative nurse practitioner.  Symptom Management Clinic: We discussed our symptom management clinic which is available for acute concerns while receiving treatment such as nausea, vomiting or diarrhea.  We can be reached via telephone at [phone] or through my chart.  We are available for virtual or in person visits on the same day from 830 to 4 PM Monday through Friday. He denies needing specific assistance at this time and He will be followed by Dr. Tasia Catchings clinical team.  Plan: Discussed symptom management clinic. Discussed palliative care services. Discussed resources that are available here at the cancer center. Discussed medications and new prescriptions to begin treatment such as anti-nausea or steroids.   Disposition: RTC on 12/19/2019 for lab work, MD assessment and to initiate chemotherapy.  Visit Diagnosis 1. 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 He also understands that He can call clinic at any time with any questions, concerns, or complaints.   Greater than 50% was spent in counseling and coordination of care with this patient including but not limited to discussion of the relevant topics above (See A&P) including, but not limited to diagnosis  and management of acute and chronic medical conditions.   [place] [facility] [place]  CC: Dr. Tasia Catchings

## 2019-12-19 ENCOUNTER — Inpatient Hospital Stay: Payer: Medicare Other

## 2019-12-19 ENCOUNTER — Telehealth: Payer: Self-pay

## 2019-12-19 ENCOUNTER — Inpatient Hospital Stay (HOSPITAL_BASED_OUTPATIENT_CLINIC_OR_DEPARTMENT_OTHER): Payer: Medicare Other | Admitting: Oncology

## 2019-12-19 ENCOUNTER — Other Ambulatory Visit: Payer: Self-pay

## 2019-12-19 ENCOUNTER — Encounter: Payer: Self-pay | Admitting: Oncology

## 2019-12-19 VITALS — BP 149/63 | HR 77 | Temp 97.6°F | Resp 18 | Wt 162.3 lb

## 2019-12-19 DIAGNOSIS — C159 Malignant neoplasm of esophagus, unspecified: Secondary | ICD-10-CM

## 2019-12-19 DIAGNOSIS — Z5111 Encounter for antineoplastic chemotherapy: Secondary | ICD-10-CM | POA: Diagnosis not present

## 2019-12-19 DIAGNOSIS — R12 Heartburn: Secondary | ICD-10-CM

## 2019-12-19 LAB — CBC WITH DIFFERENTIAL/PLATELET
Abs Immature Granulocytes: 0.07 10*3/uL (ref 0.00–0.07)
Basophils Absolute: 0.1 10*3/uL (ref 0.0–0.1)
Basophils Relative: 1 %
Eosinophils Absolute: 0.2 10*3/uL (ref 0.0–0.5)
Eosinophils Relative: 2 %
HCT: 42.7 % (ref 39.0–52.0)
Hemoglobin: 13.7 g/dL (ref 13.0–17.0)
Immature Granulocytes: 1 %
Lymphocytes Relative: 7 %
Lymphs Abs: 0.9 10*3/uL (ref 0.7–4.0)
MCH: 27.5 pg (ref 26.0–34.0)
MCHC: 32.1 g/dL (ref 30.0–36.0)
MCV: 85.7 fL (ref 80.0–100.0)
Monocytes Absolute: 0.7 10*3/uL (ref 0.1–1.0)
Monocytes Relative: 5 %
Neutro Abs: 11.6 10*3/uL — ABNORMAL HIGH (ref 1.7–7.7)
Neutrophils Relative %: 84 %
Platelets: 297 10*3/uL (ref 150–400)
RBC: 4.98 MIL/uL (ref 4.22–5.81)
RDW: 15.5 % (ref 11.5–15.5)
WBC: 13.6 10*3/uL — ABNORMAL HIGH (ref 4.0–10.5)
nRBC: 0 % (ref 0.0–0.2)

## 2019-12-19 LAB — COMPREHENSIVE METABOLIC PANEL
ALT: 15 U/L (ref 0–44)
AST: 17 U/L (ref 15–41)
Albumin: 3.6 g/dL (ref 3.5–5.0)
Alkaline Phosphatase: 64 U/L (ref 38–126)
Anion gap: 10 (ref 5–15)
BUN: 20 mg/dL (ref 8–23)
CO2: 26 mmol/L (ref 22–32)
Calcium: 8.8 mg/dL — ABNORMAL LOW (ref 8.9–10.3)
Chloride: 103 mmol/L (ref 98–111)
Creatinine, Ser: 1.05 mg/dL (ref 0.61–1.24)
GFR calc Af Amer: >60 mL/min (ref >60)
GFR calc non Af Amer: >60 mL/min (ref >60)
Glucose, Bld: 227 mg/dL — ABNORMAL HIGH (ref 70–99)
Potassium: 4.4 mmol/L (ref 3.5–5.1)
Sodium: 139 mmol/L (ref 135–145)
Total Bilirubin: 0.6 mg/dL (ref 0.3–1.2)
Total Protein: 6.6 g/dL (ref 6.5–8.1)

## 2019-12-19 MED ORDER — PALONOSETRON HCL INJECTION 0.25 MG/5ML
0.2500 mg | Freq: Once | INTRAVENOUS | Status: AC
  Administered 2019-12-19: 0.25 mg via INTRAVENOUS
  Filled 2019-12-19: qty 5

## 2019-12-19 MED ORDER — SODIUM CHLORIDE 0.9 % IV SOLN
10.0000 mg | Freq: Once | INTRAVENOUS | Status: AC
  Administered 2019-12-19: 10 mg via INTRAVENOUS
  Filled 2019-12-19: qty 10

## 2019-12-19 MED ORDER — OXALIPLATIN CHEMO INJECTION 100 MG/20ML
130.0000 mg/m2 | Freq: Once | INTRAVENOUS | Status: AC
  Administered 2019-12-19: 240 mg via INTRAVENOUS
  Filled 2019-12-19: qty 40

## 2019-12-19 MED ORDER — DEXTROSE 5 % IV SOLN
Freq: Once | INTRAVENOUS | Status: AC
  Filled 2019-12-19: qty 250

## 2019-12-19 NOTE — Telephone Encounter (Signed)
LM on VM please return my call  

## 2019-12-19 NOTE — Telephone Encounter (Signed)
-----   Message from Ralene Bathe, MD sent at 12/19/2019 12:45 PM EDT ----- Skin , left pretibial SQUAMOUS CELL CARCINOMA, KERATOACANTHOMA TYPE  Cancer - SCC Already treated Recheck next visit

## 2019-12-19 NOTE — Progress Notes (Signed)
Hematology/Oncology note [facility] Telephone:([phone] [phone]   Patient Care Team: Idelle Crouch, MD as PCP - General (Internal Medicine) Christene Lye, MD (General Surgery) Clent Jacks, RN as Oncology Nurse Navigator  REFERRING PROVIDER: Idelle Crouch, MD  CHIEF COMPLAINTS/REASON FOR VISIT:  Follow up  esophageal cancer  HISTORY OF PRESENTING ILLNESS:   Blake [name] is a  74 y.o.  male with PMH listed below was seen in consultation at the request of  Idelle Crouch, MD  for evaluation of esophageal cancer Patient has longstanding Barrett's esophagus. 03/21/2019 patient underwent surveillance endoscopy which showed esophageal mucosal changes secondary to long segment Barrett's disease present in the lower third of the esophagus.  Maximal longitudinal extent of these mucosal changes were 7 cm in length.  Mucosa was biopsied with cold forceps for histology in 4 quadrants at the interval of 2 cm in the lower third of esophagus.  2 cm hiatal hernia, gastritis. 03/21/2019 colonoscopy showed diverticulosis in the sigmoid colon.  4 mm polyp in ascending colon was removed. 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  1 out of 4 esophagus biopsy showed Barrett's mucosa with erosion, indefinite for s. Colon polyp biopsy showed tubular adenoma. Patient was referred to cancer center for further evaluation and management.  04/06/2019 CT chest abdomen pelvis showed no evidence of metastatic disease in the chest abdomen or pelvis.  No esophageal mass lesion evident.  Small to moderate hiatal hernia with possible wall thickening proximal stomach.  3 mm left lower lobe pulmonary nodule attention on follow-up recommended to ensure stability.  Tiny hypodensities in the left kidney with attenuation too high to be simple cyst.  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 Jeb Levering.  His initial PET scan 11/08/2004 showed abnormal localization in a right neck mass possibly a lymph node, slightly asymmetric localization at the base of the tongue.  Pathology is not available in current EMR.   #06/09/19, EMR at [facility].  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 EMR specimen was received in multiple fragments and margins can not be evaluated.  1 gastric polyps were biopsied which showed fundic gland polyp. Negative for dysplasia  #08/08/19, to OR for EGD with sessile tumor from 29-33cm, at least 1.5 cm wide. Pathology with the following: 1) esophagus (30cm) - residual invasive adenocarcinoma, at least invasive to muscularis mucosa and 2) esophagus (30cm) - superficial fragments of highly dysplastic epithelium, focally suspicious for intramucosal carcinoma  #  09/12/2019, patient underwent esophagectomy with pathological revealed  Negative esophageal margin, gastric margin, esophagogastrectomy, invasive moderately differentiated esophageal adenocarcinoma involved distal esophageal and GE junction, tumor focally invades the submucosa.  Left gastric lymph node x 4 negative, greater curvature lymph nodes 1/3 is positive for 57m metastatic carcinoma.  Periesophageal lymph node x 6, negative, final pathology stage pT1b pn1, Esophageal adenocarcinoma, Stage IIB Lymph node positive diease, no neoadjuvant chemotherapy, recommended by  Dr. UFanny Skatesand was recommended for adjuvant chemotherapy.  Patient prefers to receive treatment closer to home and patient call back to establish care. No radiation was recommended due to the concern of dehiscence. 11/28/2019, patient had a stent removal.  INTERVAL HISTORY Blake DILAUROis a 74y.o. male who has above history reviewed by me today presents for follow up visit for  management of esophageal  cancer.  Problems and complaints are listed below: Patient has no new complaints.  He continues to have symptoms from acid reflux.  Intermittent diarrhea since his surgery. He has had Xeloda delivered.  Will be starting Xeloda today .   Review of Systems  Constitutional: Negative for appetite change, chills, fatigue, fever and unexpected weight change.  HENT:   Negative for hearing loss and voice change.   Eyes: Negative for eye problems and icterus.  Respiratory: Negative for chest tightness, cough and shortness of breath.   Cardiovascular: Negative for chest pain and leg swelling.  Gastrointestinal: Negative for abdominal distention and abdominal pain.       Heartburn  Endocrine: Negative for hot flashes.  Genitourinary: Negative for difficulty urinating, dysuria and frequency.   Musculoskeletal: Negative for arthralgias.  Skin: Negative for itching and rash.  Neurological: Negative for light-headedness and numbness.  Hematological: Negative for adenopathy. Does not bruise/bleed easily.  Psychiatric/Behavioral: Negative for confusion.    MEDICAL HISTORY:  Past Medical History:  Diagnosis Date  . Arthritis   . Back pain    with leg pain  . Basal cell carcinoma 09/04/2009   right cheek 5.5 cm ant to earlobe, right ant nasal alar rim  . Basal cell carcinoma 07/31/2014   right mid brow  . Basal cell carcinoma 07/30/2016   right ant nasal alar rim at ant edge of BCC scar  . 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 Coronary artery disease   . Dyspnea   . GERD (gastroesophageal reflux disease)   . Hyperlipidemia   . Hypertension   . Numbness and tingling of both lower extremities    with positioning  . Prostate enlargement     SURGICAL HISTORY: Past Surgical History:  Procedure Laterality Date  . ABDOMINAL SURGERY    . COLONOSCOPY  12/08/2003  . COLONOSCOPY WITH PROPOFOL N/A 03/21/2019   Procedure: COLONOSCOPY WITH  PROPOFOL;  Surgeon: Toledo, Benay Pike, MD;  Location: [facility];  Service: Gastroenterology;  Laterality: N/A;  . ESOPHAGOGASTRODUODENOSCOPY (EGD) WITH PROPOFOL N/A 03/21/2019   Procedure: ESOPHAGOGASTRODUODENOSCOPY (EGD) WITH PROPOFOL;  Surgeon: Toledo, Benay Pike, MD;  Location: [facility];  Service: Gastroenterology;  Laterality: N/A;  . esophogeal cancer    . JOINT REPLACEMENT Right    total hip  . LUMBAR LAMINECTOMY/DECOMPRESSION MICRODISCECTOMY N/A 03/03/2018   Procedure: LUMBAR LAMINECTOMY/DECOMPRESSION MICRODISCECTOMY 1 LEVEL-L3-4,L4-5;  Surgeon: Meade Maw, MD;  Location: [facility] ORS;  Service: Neurosurgery;  Laterality: N/A;  . PARTIAL HIP ARTHROPLASTY  2014  . PORT-A-CATH REMOVAL    . TONGUE BIOPSY  2006  . TONSILLECTOMY    . TRIGGER FINGER RELEASE Right 2013  . UPPER GI ENDOSCOPY  01/25/14   multiple gastric polyps    SOCIAL HISTORY: Social History   Socioeconomic History  . Marital status: Married    Spouse name: Not on file  . Number of children: Not on file  . Years of education: Not on file  . Highest education level: Not on file  Occupational History  . Not on file  Tobacco Use  . Smoking status: Former Smoker    Packs/day: 1.00    Years: 30.00    Pack years: 30.00    Types: Cigarettes    Quit date: 02/18/1994    Years since quitting: 25.8  . Smokeless tobacco: Never Used  Vaping Use  . Vaping Use: Never used  Substance and Sexual Activity  .  Alcohol use: Yes  . Drug use: No  . Sexual activity: Yes  Other Topics Concern  . Not on file  Social History Narrative  . Not on file   Social Determinants of Health   Financial Resource Strain:   . Difficulty of Paying Living Expenses: Not on file  Food Insecurity:   . Worried About Charity fundraiser in the Last Year: Not on file  . Ran Out of Food in the Last Year: Not on file  Transportation Needs:   . Lack of Transportation (Medical): Not on file  . Lack of Transportation (Non-Medical):  Not on file  Physical Activity:   . Days of Exercise per Week: Not on file  . Minutes of Exercise per Session: Not on file  Stress:   . Feeling of Stress : Not on file  Social Connections:   . Frequency of Communication with Friends and Family: Not on file  . Frequency of Social Gatherings with Friends and Family: Not on file  . Attends Religious Services: Not on file  . Active Member of Clubs or Organizations: Not on file  . Attends Archivist Meetings: Not on file  . Marital Status: Not on file  Intimate Partner Violence:   . Fear of Current or Ex-Partner: Not on file  . Emotionally Abused: Not on file  . Physically Abused: Not on file  . Sexually Abused: Not on file    FAMILY HISTORY: Family History  Problem Relation Age of Onset  . Diabetes Mother   . Lung cancer Father     ALLERGIES:  is allergic to sulfa antibiotics.  MEDICATIONS:  Current Outpatient Medications  Medication Sig Dispense Refill  . amiodarone (PACERONE) 200 MG tablet Take 200 mg by mouth daily.    [name] Kitchen aspirin EC 81 MG tablet Take 81 mg by mouth daily.    . Blood Glucose Monitoring Suppl (ONE TOUCH ULTRA SYSTEM KIT) w/Device KIT     . glimepiride (AMARYL) 4 MG tablet Take 4 mg by mouth daily with breakfast.     . methocarbamol (ROBAXIN) 500 MG tablet Take 1 tablet (500 mg total) by mouth every 6 (six) hours as needed for muscle spasms. 90 tablet 0  . metoprolol succinate (TOPROL-XL) 50 MG 24 hr tablet Take 50 mg by mouth daily.     [name] Kitchen omeprazole (PRILOSEC) 40 MG capsule Take 40 mg by mouth in the morning and at bedtime.   3  . ondansetron (ZOFRAN) 8 MG tablet Take 1 tablet (8 mg total) by mouth 2 (two) times daily as needed for refractory nausea / vomiting. Start on day 3 after chemotherapy. 30 tablet 1  . prochlorperazine (COMPAZINE) 10 MG tablet Take 1 tablet (10 mg total) by mouth every 6 (six) hours as needed (Nausea or vomiting). 30 tablet 1  . tamsulosin (FLOMAX) 0.4 MG CAPS capsule Take 0.4  mg by mouth daily after supper.     [name] Kitchen atorvastatin (LIPITOR) 80 MG tablet Take 80 mg by mouth daily.  (Patient not taking: Reported on 12/19/2019)  2  . capecitabine (XELODA) 150 MG tablet Take 2 tablets (300 mg total) by mouth 2 (two) times daily. Take 14 days on, 7 days off, repeat every 21 days. Take with 500 mg tablets. (Patient not taking: Reported on 12/19/2019) 56 tablet 0  . capecitabine (XELODA) 500 MG tablet Take 3 tablets (1,500 mg total) by mouth 2 (two) times daily. Take 14 days on, 7 days off, repeat every 21 days. Take  along with 150 mg tablets. (Patient not taking: Reported on 12/19/2019) 84 tablet 0  . cephALEXin (KEFLEX) 500 MG capsule Take 500 mg by mouth 4 (four) times daily. (Patient not taking: Reported on 12/09/2019)    . gabapentin (NEURONTIN) 300 MG capsule prn (Patient not taking: Reported on 12/19/2019)    . ibuprofen (ADVIL) 800 MG tablet Take 800 mg by mouth every 8 (eight) hours as needed. (Patient not taking: Reported on 12/19/2019)    . lisinopril (PRINIVIL,ZESTRIL) 10 MG tablet Take 10 mg by mouth 2 (two) times daily.  (Patient not taking: Reported on 12/09/2019)    . meloxicam (MOBIC) 15 MG tablet Take 15 mg by mouth daily. (Patient not taking: Reported on 12/09/2019)    . mupirocin ointment (BACTROBAN) 2 % Apply 1 application topically daily. Qd to aa back until clear (Patient not taking: Reported on 12/09/2019) 22 g 1  . Omega-3 1000 MG CAPS Take by mouth. (Patient not taking: Reported on 12/09/2019)    . oxyCODONE (OXY IR/ROXICODONE) 5 MG immediate release tablet Take 1 tablet (5 mg total) by mouth every 4 (four) hours as needed for moderate pain ((score 4 to 6)). (Patient not taking: Reported on 12/08/2019) 30 tablet 0  . polyethylene glycol powder (GLYCOLAX/MIRALAX) powder 255 grams one bottle for colonoscopy prep (Patient not taking: Reported on 12/09/2019) 255 g 0   No current facility-administered medications for this visit.     PHYSICAL EXAMINATION: ECOG PERFORMANCE  STATUS: 1 - Symptomatic but completely ambulatory Vitals:   12/19/19 0936  BP: (!) 149/63  Pulse: 77  Resp: 18  Temp: 97.6 F (36.4 C)  SpO2: 98%   Filed Weights   12/19/19 0936  Weight: 162 lb 4.8 oz (73.6 kg)    Physical Exam Constitutional:      General: He is not in acute distress. HENT:     Head: Normocephalic and atraumatic.  Eyes:     General: No scleral icterus.    Pupils: Pupils are equal, round, and reactive to light.  Cardiovascular:     Rate and Rhythm: Normal rate and regular rhythm.     Heart sounds: Normal heart sounds.  Pulmonary:     Effort: Pulmonary effort is normal. No respiratory distress.     Breath sounds: No wheezing.  Abdominal:     General: Bowel sounds are normal. There is no distension.     Palpations: Abdomen is soft. There is no mass.     Tenderness: There is no abdominal tenderness.  Musculoskeletal:        General: No deformity. Normal range of motion.     Cervical back: Normal range of motion and neck supple.  Skin:    General: Skin is warm and dry.     Findings: No erythema or rash.  Neurological:     Mental Status: He is alert and oriented to person, place, and time. Mental status is at baseline.     Cranial Nerves: No cranial nerve deficit.     Coordination: Coordination normal.  Psychiatric:        Mood and Affect: Mood normal.        Behavior: Behavior normal.        Thought Content: Thought content normal.     LABORATORY DATA:  I have reviewed the data as listed Lab Results  Component Value Date   WBC 13.6 (H) 12/19/2019   HGB 13.7 12/19/2019   HCT 42.7 12/19/2019   MCV 85.7 12/19/2019   PLT 297 12/19/2019  Recent Labs    04/06/19 1150 04/11/19 1230 12/19/19 0906  NA  --  138 139  K  --  4.5 4.4  CL  --  103 103  CO2  --  25 26  GLUCOSE  --  199* 227*  BUN  --  18 20  CREATININE 1.13 1.04 1.05  CALCIUM  --  8.8* 8.8*  GFRNONAA >60 >60 >60  GFRAA >60 >60 >60  PROT  --  7.2 6.6  ALBUMIN  --  4.2 3.6    AST  --  23 17  ALT  --  37 15  ALKPHOS  --  91 64  BILITOT  --  0.6 0.6   Iron/TIBC/Ferritin/ %Sat No results found for: IRON, TIBC, FERRITIN, IRONPCTSAT    RADIOGRAPHIC STUDIES: I have personally reviewed the radiological images as listed and agreed with the findings in the report. DG PAIN CLINIC C-ARM 1-60 MIN NO REPORT  Result Date: 12/14/2019 Fluoro was used, but no Radiologist interpretation will be provided. Please refer to "NOTES" tab for provider progress note.  VAS US CAROTID  Result Date: 11/08/2019 Carotid Arterial Duplex Study Indications:   Carotid artery disease. Risk Factors:  Hyperlipidemia. Other Factors: Carotid stenosis. Performing Technologist: Concha Norway RVT  Examination Guidelines: A complete evaluation includes B-mode imaging, spectral Doppler, color Doppler, and power Doppler as needed of all accessible portions of each vessel. Bilateral testing is considered an integral part of a complete examination. Limited examinations for reoccurring indications may be performed as noted.  Right Carotid Findings: +----------+--------+--------+--------+-----------------------+--------+           PSV cm/sEDV cm/sStenosisPlaque Description     Comments +----------+--------+--------+--------+-----------------------+--------+ CCA Prox  70      20                                              +----------+--------+--------+--------+-----------------------+--------+ CCA Mid   57      12                                              +----------+--------+--------+--------+-----------------------+--------+ CCA Distal68      17                                              +----------+--------+--------+--------+-----------------------+--------+ ICA Prox  64      18                                              +----------+--------+--------+--------+-----------------------+--------+ ICA Mid   140     46      40-59%  smooth and heterogenous          +----------+--------+--------+--------+-----------------------+--------+ ICA Distal92      29                                              +----------+--------+--------+--------+-----------------------+--------+ ECA  91      8                                               +----------+--------+--------+--------+-----------------------+--------+ +----------+--------+-------+----------------+-------------------+           PSV cm/sEDV cmsDescribe        Arm Pressure (mmHG) +----------+--------+-------+----------------+-------------------+ [id number]             Multiphasic, WNL                    +----------+--------+-------+----------------+-------------------+ +---------+--------+--+--------+---------+ VertebralPSV cm/s33EDV cm/sAntegrade +---------+--------+--+--------+---------+ ICA/CCA ratio = 2.45 Left Carotid Findings: +----------+--------+--------+--------+------------------+--------+           PSV cm/sEDV cm/sStenosisPlaque DescriptionComments +----------+--------+--------+--------+------------------+--------+ CCA Prox  117     20                                         +----------+--------+--------+--------+------------------+--------+ CCA Mid   67      14                                         +----------+--------+--------+--------+------------------+--------+ CCA Distal67      14                                         +----------+--------+--------+--------+------------------+--------+ ICA Prox  83      31                                         +----------+--------+--------+--------+------------------+--------+ ICA Mid   89      27      1-39%   calcific                   +----------+--------+--------+--------+------------------+--------+ ICA Distal85      31                                         +----------+--------+--------+--------+------------------+--------+ ECA       136     16                                          +----------+--------+--------+--------+------------------+--------+ +----------+--------+--------+----------------+-------------------+           PSV cm/sEDV cm/sDescribe        Arm Pressure (mmHG) +----------+--------+--------+----------------+-------------------+ [id number]             Multiphasic, WNL                    +----------+--------+--------+----------------+-------------------+ +---------+--------+--+--------+---------+ VertebralPSV cm/s44EDV cm/sAntegrade +---------+--------+--+--------+---------+ ICA/CCA ratio = 1.3  Summary: Right Carotid: Velocities in the right ICA are consistent with a 60-79%                stenosis. ICA stenosis by velocity suggests the high end of  40-59%, however by 2D area measurement it appears to be 65-70% in                the proximal ICA due to smooth heterogeneous plaque. Left Carotid: Velocities in the left ICA are consistent with a 1-39% stenosis.               Mild calcified plaque in the bulb/ICA. Vertebrals:  Bilateral vertebral arteries demonstrate antegrade flow. Subclavians: Normal flow hemodynamics were seen in bilateral subclavian              arteries. *See table(s) above for measurements and observations.  Electronically signed by Leotis Pain MD on 11/08/2019 at 5:25:10 PM.    Final        ASSESSMENT & PLAN:  1. 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[place])   2. Encounter for antineoplastic chemotherapy   3. Heartburn   Cancer Staging Malignant neoplasm of lower third of esophagus (HCC) Staging form: Esophagus - Adenocarcinoma, AJCC 8th Edition - Clinical: No stage assigned - Unsigned - Pathologic stage from 12/09/2019: Stage IIB (pT1b, pN1, cM0, G2) - Signed by Earlie Server, MD on 12/09/2019   #Esophageal adenocarcinoma, Stage IIB Plan for CapOx regimen for 6 months.   Patient has lost some weight during interval. Labs reviewed and discussed with patient. Is going to proceed with cycle 1 oxaliplatin today.  Xeloda,  I will start with 832m/m2 twice daily.  If he tolerates, may increase to 1000 mg per metered square dosage at the next cycle. Patient will take Xeloda 1500 mg twice daily day 1-14 q 21 days.  #GERD, this is a chronic issue for him which seems to have been exacerbated after esophagectomy. Patient takes PPI.  Suggest patient to follow-up with gastroenterology. 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 Supportive care measures are necessary for patient well-being and will be provided as necessary. We spent sufficient time to discuss many aspect of care, questions were answered to patient's satisfaction.  All questions were answered. The patient knows to call the clinic with any problems questions or concerns.  cc SIdelle Crouch MD  Follow-up in 1 week  We spent sufficient time to discuss many aspect of care, questions were answered to patient's satisfaction.   ZEarlie Server MD, PhD Hematology Oncology CSutter Bay Medical Foundation D[facility] Surgery Center Los Altosat APeacehealth St John Medical Center - Broadway CampusPager- [phone]/20/2021

## 2019-12-19 NOTE — Telephone Encounter (Cosign Needed)
Oral Chemotherapy Pharmacy Student Encounter   Provided Blake [name] with a calendar for his new start Xeloda while he was in clinic for labs, visit and infusion today. Patient expressed understanding and didn't have questions.   Laurey Arrow, PharmD Candidate 2022 [facility] Oral Chemotherapy [facility] [phone]  12/19/2019 11:22 AM

## 2019-12-19 NOTE — Progress Notes (Signed)
Patient here for oncology follow-up appointment, expresses no complaints or concerns at this time.    

## 2019-12-20 ENCOUNTER — Telehealth: Payer: Self-pay

## 2019-12-20 LAB — CEA: CEA: 2.7 ng/mL (ref 0.0–4.7)

## 2019-12-20 NOTE — Telephone Encounter (Signed)
Spoke with pt and informed him of results. He had no concerns.  

## 2019-12-20 NOTE — Telephone Encounter (Signed)
Telephone call to patient for follow up after receiving first infusion.   Patient states infusion went great.  States eating good and drinking plenty of fluids.   Denies any nausea or vomiting.  Encouraged patient to call for any concerns or questions. 

## 2019-12-24 ENCOUNTER — Telehealth: Payer: Self-pay | Admitting: Oncology

## 2019-12-24 NOTE — Telephone Encounter (Signed)
Wife called to report herself tested positive.  Patient had a home test kit which was negative. Patient has lab in MD appointment with me on 12/26/2019.  Recommend patient to get official Covid testing. Arrange patient to have telemedicine on 12/26/2019.

## 2019-12-25 ENCOUNTER — Encounter: Payer: Self-pay | Admitting: Oncology

## 2019-12-25 ENCOUNTER — Other Ambulatory Visit: Payer: Self-pay | Admitting: Oncology

## 2019-12-25 ENCOUNTER — Ambulatory Visit (HOSPITAL_COMMUNITY)
Admission: RE | Admit: 2019-12-25 | Discharge: 2019-12-25 | Disposition: A | Discharge status: Home / Self Care | Payer: Medicare Other | Source: Ambulatory Visit | Attending: Pulmonary Disease | Admitting: Pulmonary Disease

## 2019-12-25 DIAGNOSIS — U071 COVID-19: Secondary | ICD-10-CM | POA: Insufficient documentation

## 2019-12-25 DIAGNOSIS — Z23 Encounter for immunization: Secondary | ICD-10-CM | POA: Insufficient documentation

## 2019-12-25 MED ORDER — ALBUTEROL SULFATE HFA 108 (90 BASE) MCG/ACT IN AERS: 2.0000 | INHALATION_SPRAY | Freq: Once | RESPIRATORY_TRACT | Status: DC | PRN

## 2019-12-25 MED ORDER — EPINEPHRINE 0.3 MG/0.3ML IJ SOAJ: 0.3000 mg | Freq: Once | INTRAMUSCULAR | Status: DC | PRN

## 2019-12-25 MED ORDER — DIPHENHYDRAMINE HCL 50 MG/ML IJ SOLN: 50.0000 mg | Freq: Once | INTRAMUSCULAR | Status: DC | PRN

## 2019-12-25 MED ORDER — FAMOTIDINE IN NACL 20-0.9 MG/50ML-% IV SOLN: 20.0000 mg | Freq: Once | INTRAVENOUS | Status: DC | PRN

## 2019-12-25 MED ORDER — SODIUM CHLORIDE 0.9 % IV SOLN: INTRAVENOUS | Status: DC | PRN

## 2019-12-25 MED ORDER — SODIUM CHLORIDE 0.9 % IV SOLN
1200.0000 mg | Freq: Once | INTRAVENOUS | Status: AC
  Administered 2019-12-25: 1200 mg via INTRAVENOUS

## 2019-12-25 MED ORDER — METHYLPREDNISOLONE SODIUM SUCC 125 MG IJ SOLR: 125.0000 mg | Freq: Once | INTRAMUSCULAR | Status: DC | PRN

## 2019-12-25 NOTE — Discharge Instructions (Signed)

## 2019-12-25 NOTE — Progress Notes (Signed)
  Diagnosis: COVID-19  Physician: Dr. Wright   Procedure: Covid Infusion Clinic Med: casirivimab\imdevimab infusion - Provided patient with casirivimab\imdevimab fact sheet for patients, parents and caregivers prior to infusion.  Complications: No immediate complications noted.  Discharge: Discharged home   Blake Turano  [name] 12/25/2019   

## 2019-12-25 NOTE — Progress Notes (Signed)
I connected by phone with  Blake [name]  to discuss the potential use of an new treatment for mild to moderate COVID-19 viral infection in non-hospitalized patients.   This patient is a age/sex that meets the FDA criteria for Emergency Use Authorization of casirivimab\imdevimab.  Has a (+) direct SARS-CoV-2 viral test result 1. Has mild or moderate COVID-19  2. Is ? 74 years of age and weighs ? 40 kg 3. Is NOT hospitalized due to COVID-19 4. Is NOT requiring oxygen therapy or requiring an increase in baseline oxygen flow rate due to COVID-19 5. Is within 10 days of symptom onset 6. Has at least one of the high risk factor(s) for progression to severe COVID-19 and/or hospitalization as defined in EUA. Specific high risk criteria : Past Medical History:  Diagnosis Date  . Arthritis   . Back pain    with leg pain  . Basal cell carcinoma 09/04/2009   right cheek 5.5 cm ant to earlobe, right ant nasal alar rim  . Basal cell carcinoma 07/31/2014   right mid brow  . Basal cell carcinoma 07/30/2016   right ant nasal alar rim at ant edge of BCC scar  . 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 Coronary artery disease   . Dyspnea   . GERD (gastroesophageal reflux disease)   . Hyperlipidemia   . Hypertension   . Numbness and tingling of both lower extremities    with positioning  . Prostate enlargement   ?  ?    Symptom onset  12/23/19   I have spoken and communicated the following to the patient or parent/caregiver:   1. FDA has authorized the emergency use of casirivimab\imdevimab for the treatment of mild to moderate COVID-19 in adults and pediatric patients with positive results of direct SARS-CoV-2 viral testing who are 56 years of age and older weighing at least 40 kg, and who are at high risk for progressing to severe COVID-19 and/or hospitalization.   2. The significant known and potential risks and benefits of  casirivimab\imdevimab, and the extent to which such potential risks and benefits are unknown.   3. Information on available alternative treatments and the risks and benefits of those alternatives, including clinical trials.   4. Patients treated with casirivimab\imdevimab should continue to self-isolate and use infection control measures (e.g., wear mask, isolate, social distance, avoid sharing personal items, clean and disinfect "high touch" surfaces, and frequent handwashing) according to CDC guidelines.    5. The patient or parent/caregiver has the option to accept or refuse casirivimab\imdevimab .   After reviewing this information with the patient, The patient agreed to proceed with receiving casirivimab\imdevimab infusion and will be provided a copy of the Fact sheet prior to receiving the infusion.Rulon Abide, AGNP-C [phone] ([place])

## 2019-12-26 ENCOUNTER — Inpatient Hospital Stay: Payer: Medicare Other

## 2019-12-26 ENCOUNTER — Other Ambulatory Visit: Payer: Self-pay

## 2019-12-26 ENCOUNTER — Encounter: Payer: Self-pay | Admitting: Oncology

## 2019-12-26 ENCOUNTER — Inpatient Hospital Stay (HOSPITAL_BASED_OUTPATIENT_CLINIC_OR_DEPARTMENT_OTHER): Payer: Medicare Other | Admitting: Oncology

## 2019-12-26 DIAGNOSIS — R112 Nausea with vomiting, unspecified: Secondary | ICD-10-CM

## 2019-12-26 DIAGNOSIS — C159 Malignant neoplasm of esophagus, unspecified: Secondary | ICD-10-CM

## 2019-12-26 DIAGNOSIS — R197 Diarrhea, unspecified: Secondary | ICD-10-CM

## 2019-12-26 DIAGNOSIS — R1110 Vomiting, unspecified: Secondary | ICD-10-CM | POA: Insufficient documentation

## 2019-12-26 MED ORDER — PROMETHAZINE HCL 25 MG PO TABS: 25.0000 mg | ORAL_TABLET | Freq: Four times a day (QID) | ORAL | 0 refills | Status: DC | PRN

## 2019-12-26 NOTE — Progress Notes (Signed)
HEMATOLOGY-ONCOLOGY TeleHEALTH VISIT PROGRESS NOTE  I connected with Blake [name] on 12/26/19 at 10:45 AM EDT by video enabled telemedicine visit and verified that I am speaking with the correct person using two identifiers. I discussed the limitations, risks, security and privacy concerns of performing an evaluation and management service by telemedicine and the availability of in-person appointments. I also discussed with the patient that there may be a patient responsible charge related to this service. The patient expressed understanding and agreed to proceed.   Other persons participating in the visit and their role in the encounter:  None  Patient's location: Home  Provider's location: office Chief Complaint: Follow-up for Chemotherapy tolerability   INTERVAL HISTORY Blake [name] is a 74 y.o. male who has above history reviewed by me today presents for follow up visit for Esophageal cancer, follow-up for chemotherapy tolerability. Problems and complaints are listed below:  Patient started Xeloda and oxaliplatin 1 week ago.  He reports having stomach upset symptoms as well as diarrhea.  He has tried nausea medications with both Compazine and Zofran which only helped a little bit.  He has poor appetite.  Not eating much.  Has lost weight since last visit. He is able to keep fluids down.  He drinks Gatorade. Wife was recently tested positive for COVID-19 infection.  Patient took a test yesterday and currently COVID-19 result is pending.   Review of Systems  Constitutional: Positive for appetite change and fatigue. Negative for chills, fever and unexpected weight change.  HENT:   Negative for hearing loss and voice change.   Eyes: Negative for eye problems and icterus.  Respiratory: Negative for chest tightness, cough and shortness of breath.   Cardiovascular: Negative for chest pain and leg swelling.  Gastrointestinal: Positive for diarrhea and nausea. Negative for abdominal  distention and abdominal pain.  Endocrine: Negative for hot flashes.  Genitourinary: Negative for difficulty urinating, dysuria and frequency.   Musculoskeletal: Negative for arthralgias.  Skin: Negative for itching and rash.  Neurological: Negative for light-headedness and numbness.  Hematological: Negative for adenopathy. Does not bruise/bleed easily.  Psychiatric/Behavioral: Negative for confusion.    Past Medical History:  Diagnosis Date  . Arthritis   . Back pain    with leg pain  . Basal cell carcinoma 09/04/2009   right cheek 5.5 cm ant to earlobe, right ant nasal alar rim  . Basal cell carcinoma 07/31/2014   right mid brow  . Basal cell carcinoma 07/30/2016   right ant nasal alar rim at ant edge of BCC scar  . 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 Coronary artery disease   . Dyspnea   . GERD (gastroesophageal reflux disease)   . Hyperlipidemia   . Hypertension   . Numbness and tingling of both lower extremities    with positioning  . Prostate enlargement    Past Surgical History:  Procedure Laterality Date  . ABDOMINAL SURGERY    . COLONOSCOPY  12/08/2003  . COLONOSCOPY WITH PROPOFOL N/A 03/21/2019   Procedure: COLONOSCOPY WITH PROPOFOL;  Surgeon: Toledo, Benay Pike, MD;  Location: [facility] END[facility]COPY;  Service: Gastroenterology;  Laterality: N/A;  . ESOPHAGOGASTRODUODENOSCOPY (EGD) WITH PROPOFOL N/A 03/21/2019   Procedure: ESOPHAGOGASTRODUODENOSCOPY (EGD) WITH PROPOFOL;  Surgeon: Toledo, Benay Pike, MD;  Location: [facility] ENDOSCOPY;  Service: Gastroenterology;  Laterality: N/A;  . esophogeal cancer    . JOINT REPLACEMENT Right    total hip  . LUMBAR LAMINECTOMY/DECOMPRESSION MICRODISCECTOMY  N/A 03/03/2018   Procedure: LUMBAR LAMINECTOMY/DECOMPRESSION MICRODISCECTOMY 1 LEVEL-L3-4,L4-5;  Surgeon: Meade Maw, MD;  Location: [facility] ORS;  Service: Neurosurgery;  Laterality: N/A;  . PARTIAL HIP ARTHROPLASTY  2014  .  PORT-A-CATH REMOVAL    . TONGUE BIOPSY  2006  . TONSILLECTOMY    . TRIGGER FINGER RELEASE Right 2013  . UPPER GI ENDOSCOPY  01/25/14   multiple gastric polyps    Family History  Problem Relation Age of Onset  . Diabetes Mother   . Lung cancer Father     Social History   Socioeconomic History  . Marital status: Married    Spouse name: Not on file  . Number of children: Not on file  . Years of education: Not on file  . Highest education level: Not on file  Occupational History  . Not on file  Tobacco Use  . Smoking status: Former Smoker    Packs/day: 1.00    Years: 30.00    Pack years: 30.00    Types: Cigarettes    Quit date: 02/18/1994    Years since quitting: 25.8  . Smokeless tobacco: Never Used  Vaping Use  . Vaping Use: Never used  Substance and Sexual Activity  . Alcohol use: Yes  . Drug use: No  . Sexual activity: Yes  Other Topics Concern  . Not on file  Social History Narrative  . Not on file   Social Determinants of Health   Financial Resource Strain:   . Difficulty of Paying Living Expenses: Not on file  Food Insecurity:   . Worried About Charity fundraiser in the Last Year: Not on file  . Ran Out of Food in the Last Year: Not on file  Transportation Needs:   . Lack of Transportation (Medical): Not on file  . Lack of Transportation (Non-Medical): Not on file  Physical Activity:   . Days of Exercise per Week: Not on file  . Minutes of Exercise per Session: Not on file  Stress:   . Feeling of Stress : Not on file  Social Connections:   . Frequency of Communication with Friends and Family: Not on file  . Frequency of Social Gatherings with Friends and Family: Not on file  . Attends Religious Services: Not on file  . Active Member of Clubs or Organizations: Not on file  . Attends Archivist Meetings: Not on file  . Marital Status: Not on file  Intimate Partner Violence:   . Fear of Current or Ex-Partner: Not on file  . Emotionally  Abused: Not on file  . Physically Abused: Not on file  . Sexually Abused: Not on file    Current Outpatient Medications on File Prior to Visit  Medication Sig Dispense Refill  . amiodarone (PACERONE) 200 MG tablet Take 200 mg by mouth daily.    [name] Kitchen aspirin EC 81 MG tablet Take 81 mg by mouth daily.    [name] Kitchen atorvastatin (LIPITOR) 80 MG tablet Take 80 mg by mouth daily.   2  . Blood Glucose Monitoring Suppl (ONE TOUCH ULTRA SYSTEM KIT) w/Device KIT     . capecitabine (XELODA) 150 MG tablet Take 2 tablets (300 mg total) by mouth 2 (two) times daily. Take 14 days on, 7 days off, repeat every 21 days. Take with 500 mg tablets. 56 tablet 0  . capecitabine (XELODA) 500 MG tablet Take 3 tablets (1,500 mg total) by mouth 2 (two) times daily. Take 14 days on, 7 days off, repeat every 21  days. Take along with 150 mg tablets. 84 tablet 0  . metFORMIN (GLUCOPHAGE) 500 MG tablet Take 500 mg by mouth 2 (two) times daily.    . metoprolol succinate (TOPROL-XL) 50 MG 24 hr tablet Take 50 mg by mouth daily.     [name] Kitchen omeprazole (PRILOSEC) 40 MG capsule Take 40 mg by mouth in the morning and at bedtime.   3  . ondansetron (ZOFRAN) 8 MG tablet Take 1 tablet (8 mg total) by mouth 2 (two) times daily as needed for refractory nausea / vomiting. Start on day 3 after chemotherapy. 30 tablet 1  . prochlorperazine (COMPAZINE) 10 MG tablet Take 1 tablet (10 mg total) by mouth every 6 (six) hours as needed (Nausea or vomiting). 30 tablet 1  . tamsulosin (FLOMAX) 0.4 MG CAPS capsule Take 0.4 mg by mouth daily after supper.     . meloxicam (MOBIC) 15 MG tablet Take 15 mg by mouth daily. (Patient not taking: Reported on 12/09/2019)    . methocarbamol (ROBAXIN) 500 MG tablet Take 1 tablet (500 mg total) by mouth every 6 (six) hours as needed for muscle spasms. (Patient not taking: Reported on 12/26/2019) 90 tablet 0  . oxyCODONE (OXY IR/ROXICODONE) 5 MG immediate release tablet Take 1 tablet (5 mg total) by mouth every 4 (four) hours as  needed for moderate pain ((score 4 to 6)). (Patient not taking: Reported on 12/08/2019) 30 tablet 0   No current facility-administered medications on file prior to visit.    Allergies  Allergen Reactions  . Sulfa Antibiotics Rash       Observations/Objective: Today's Vitals   12/26/19 1014  PainSc: 0-No pain   There is no height or weight on file to calculate BMI.  Physical Exam Constitutional:      General: He is not in acute distress. Neurological:     Mental Status: He is alert.     CBC    Component Value Date/Time   WBC 13.6 (H) 12/19/2019 0906   RBC 4.98 12/19/2019 0906   HGB 13.7 12/19/2019 0906   HGB 11.8 (L) 01/26/2013 0505   HCT 42.7 12/19/2019 0906   HCT 47.5 01/12/2013 1457   PLT 297 12/19/2019 0906   PLT 220 01/26/2013 0505   MCV 85.7 12/19/2019 0906   MCV 90 01/12/2013 1457   MCH 27.5 12/19/2019 0906   MCHC 32.1 12/19/2019 0906   RDW 15.5 12/19/2019 0906   RDW 13.7 01/12/2013 1457   LYMPHSABS 0.9 12/19/2019 0906   LYMPHSABS 0.9 (L) 08/11/2011 1440   MONOABS 0.7 12/19/2019 0906   MONOABS 0.4 08/11/2011 1440   EOSABS 0.2 12/19/2019 0906   EOSABS 0.0 08/11/2011 1440   BASOSABS 0.1 12/19/2019 0906   BASOSABS 0.0 08/11/2011 1440    CMP     Component Value Date/Time   NA 139 12/19/2019 0906   NA 130 (L) 01/26/2013 0505   K 4.4 12/19/2019 0906   K 4.6 01/26/2013 0505   CL 103 12/19/2019 0906   CL 101 01/26/2013 0505   CO2 26 12/19/2019 0906   CO2 26 01/26/2013 0505   GLUCOSE 227 (H) 12/19/2019 0906   GLUCOSE 138 (H) 01/26/2013 0505   BUN 20 12/19/2019 0906   BUN 19 (H) 01/26/2013 0505   CREATININE 1.05 12/19/2019 0906   CREATININE 1.18 01/26/2013 0505   CALCIUM 8.8 (L) 12/19/2019 0906   CALCIUM 8.3 (L) 01/26/2013 0505   PROT 6.6 12/19/2019 0906   PROT 6.8 08/11/2011 1440   ALBUMIN 3.6 12/19/2019 0906  ALBUMIN 3.8 08/11/2011 1440   AST 17 12/19/2019 0906   AST 18 08/11/2011 1440   ALT 15 12/19/2019 0906   ALT 35 08/11/2011 1440    ALKPHOS 64 12/19/2019 0906   ALKPHOS 75 08/11/2011 1440   BILITOT 0.6 12/19/2019 0906   BILITOT 0.4 08/11/2011 1440   GFRNONAA >60 12/19/2019 0906   GFRNONAA >60 01/26/2013 0505   GFRAA >60 12/19/2019 0906   GFRAA >60 01/26/2013 0505     Assessment and Plan: 1. 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 Non-intractable vomiting with nausea, unspecified vomiting type   3. Diarrhea, unspecified type     #Esophageal adenocarcinoma, status post cycle 1 oxaliplatin. He was also started on Xeloda 1500 mg twice daily for 1 week. He cannot get labs done at the cancer center today due to pending COVID-19 status and post exposure. I recommend patient to hold off Xeloda for now due to the acute issues.  #Nausea, not intractable. Discussed with patient about oral hydration with Gatorade and water. Compazine and Zofran did not relieve his symptoms I sent a prescription of Phenergan and discussed with patient about the instructions.  Patient will try and see if that will help his symptoms.  #Diarrhea, recommend oral hydration. Recommend Imodium and I discussed with patient about instructions.  He says that he has Imodium at home and does not need me to send him a new prescription.  Advised patient that if his symptoms get worse, he should go to emergency room to check his blood work.  #Pending COVID-19 testing resolved. I recommend patient update me about COVID-19 status.  He has received COVID-19 vaccination.   Follow Up Instructions: To be determined.   I discussed the assessment and treatment plan with the patient. The patient was provided an opportunity to ask questions and all were answered. The patient agreed with the plan and demonstrated an understanding of the instructions.  The patient was advised to call back or seek an in-person evaluation if the symptoms worsen or if the condition fails to improve as anticipated.   Earlie Server, MD 12/26/2019 4:09 PM

## 2019-12-26 NOTE — Progress Notes (Signed)
Patient contacted for Mychart visit. Pt reports that he has been taking xeloda for 1 week. He has pending covid test results. Has been sick on his stomach and having diarrhea but is unsure if it related to xeloda or covid.

## 2019-12-27 ENCOUNTER — Telehealth: Payer: Self-pay

## 2019-12-27 NOTE — Telephone Encounter (Signed)
Patient sent a MyChart message reporting this his COVID test is negative.  Dr. Tasia Catchings would like to have him scheduled for labs (cbc cmp) and +/- IVF today or tmr.

## 2019-12-27 NOTE — Telephone Encounter (Signed)
Done.. Pt has been sched as requested He is aware of his sched 12/28/19 appt for labs @ 1:30/ +/- IVF in [facility]

## 2019-12-28 ENCOUNTER — Other Ambulatory Visit: Payer: Self-pay

## 2019-12-28 ENCOUNTER — Inpatient Hospital Stay: Payer: Medicare Other

## 2019-12-28 VITALS — BP 162/78 | HR 71 | Temp 98.6°F | Resp 17 | Wt 156.6 lb

## 2019-12-28 DIAGNOSIS — Z5111 Encounter for antineoplastic chemotherapy: Secondary | ICD-10-CM | POA: Diagnosis not present

## 2019-12-28 DIAGNOSIS — E860 Dehydration: Secondary | ICD-10-CM

## 2019-12-28 DIAGNOSIS — C159 Malignant neoplasm of esophagus, unspecified: Secondary | ICD-10-CM

## 2019-12-28 LAB — CBC WITH DIFFERENTIAL/PLATELET
Abs Immature Granulocytes: 0.03 10*3/uL (ref 0.00–0.07)
Basophils Absolute: 0 10*3/uL (ref 0.0–0.1)
Basophils Relative: 1 %
Eosinophils Absolute: 0.1 10*3/uL (ref 0.0–0.5)
Eosinophils Relative: 1 %
HCT: 41.2 % (ref 39.0–52.0)
Hemoglobin: 13.6 g/dL (ref 13.0–17.0)
Immature Granulocytes: 0 %
Lymphocytes Relative: 9 %
Lymphs Abs: 0.8 10*3/uL (ref 0.7–4.0)
MCH: 27.6 pg (ref 26.0–34.0)
MCHC: 33 g/dL (ref 30.0–36.0)
MCV: 83.6 fL (ref 80.0–100.0)
Monocytes Absolute: 1.1 10*3/uL — ABNORMAL HIGH (ref 0.1–1.0)
Monocytes Relative: 12 %
Neutro Abs: 6.8 10*3/uL (ref 1.7–7.7)
Neutrophils Relative %: 77 %
Platelets: 292 10*3/uL (ref 150–400)
RBC: 4.93 MIL/uL (ref 4.22–5.81)
RDW: 15 % (ref 11.5–15.5)
WBC: 8.8 10*3/uL (ref 4.0–10.5)
nRBC: 0 % (ref 0.0–0.2)

## 2019-12-28 LAB — COMPREHENSIVE METABOLIC PANEL
ALT: 16 U/L (ref 0–44)
AST: 18 U/L (ref 15–41)
Albumin: 3.4 g/dL — ABNORMAL LOW (ref 3.5–5.0)
Alkaline Phosphatase: 56 U/L (ref 38–126)
Anion gap: 11 (ref 5–15)
BUN: 15 mg/dL (ref 8–23)
CO2: 24 mmol/L (ref 22–32)
Calcium: 8.6 mg/dL — ABNORMAL LOW (ref 8.9–10.3)
Chloride: 102 mmol/L (ref 98–111)
Creatinine, Ser: 1.07 mg/dL (ref 0.61–1.24)
GFR calc Af Amer: >60 mL/min (ref >60)
GFR calc non Af Amer: >60 mL/min (ref >60)
Glucose, Bld: 157 mg/dL — ABNORMAL HIGH (ref 70–99)
Potassium: 4.1 mmol/L (ref 3.5–5.1)
Sodium: 137 mmol/L (ref 135–145)
Total Bilirubin: 0.5 mg/dL (ref 0.3–1.2)
Total Protein: 6.8 g/dL (ref 6.5–8.1)

## 2019-12-28 MED ORDER — SODIUM CHLORIDE 0.9 % IV SOLN
Freq: Once | INTRAVENOUS | Status: AC
  Filled 2019-12-28: qty 250

## 2019-12-28 NOTE — Progress Notes (Signed)
Pt in [facility] clinic for infusion.Diarrhea is better. He took 2 imodium yest morning and has not had any more stools. Just feels fatigued basically. Appetite not great. Has not had an appetite for 2 months. He did eat a big jelly donut for lunch today. Pt is off his xeloda. Tolerating 1 liter of NS. Drinking and tolerating ginger ale. Pt denies any complaints of pain or dizziness at discharge from clinic.

## 2019-12-29 ENCOUNTER — Encounter: Payer: Self-pay | Admitting: Oncology

## 2019-12-30 ENCOUNTER — Other Ambulatory Visit: Payer: Self-pay | Admitting: Oncology

## 2019-12-30 ENCOUNTER — Telehealth: Payer: Self-pay

## 2019-12-30 DIAGNOSIS — C159 Malignant neoplasm of esophagus, unspecified: Secondary | ICD-10-CM

## 2019-12-30 MED ORDER — LIDOCAINE-PRILOCAINE 2.5-2.5 % EX CREA: TOPICAL_CREAM | CUTANEOUS | 3 refills | Status: DC

## 2019-12-30 NOTE — Progress Notes (Signed)
DISCONTINUE OFF PATHWAY REGIMEN - Gastroesophageal   [id number]:Capecitabine + Oxaliplatin (1,000/130):   A cycle is every 21 days:     Capecitabine      Oxaliplatin   **Always confirm dose/schedule in your pharmacy ordering system**  REASON: Toxicities / Adverse Event PRIOR TREATMENT: Off Pathway: Capecitabine + Oxaliplatin (1,000/130) TREATMENT RESPONSE: Unable to Evaluate  START OFF PATHWAY REGIMEN - Gastroesophageal   [id number] (Leucovorin IV D1 + Fluorouracil IV D1/CIV D1,2 + Oxaliplatin IV D1) q14 Days:   A cycle is every 14 days:     Oxaliplatin      Leucovorin      Fluorouracil      Fluorouracil   **Always confirm dose/schedule in your pharmacy ordering system**  Patient Characteristics: Esophageal & GE Junction, Adenocarcinoma, Postoperative without Neoadjuvant Therapy (Pathologic Staging), Adjuvant Therapy Histology: Adenocarcinoma Disease Classification: Esophageal Therapeutic Status: Postoperative without Neoadjuvant Therapy (Pathologic Staging) AJCC N Category: pN1 AJCC M Category: cM0 AJCC 8 Stage Grouping: IIB AJCC Grade: G2 AJCC T Category: pT1b Intent of Therapy: Curative Intent, Discussed with Patient

## 2019-12-30 NOTE — Telephone Encounter (Signed)
Patient sent Mychart requesting for port placement referral to be sent to Dr. Genevive Bi. Referral sent and I have asked patient to let us know when the port placement will be once it is scheduled. Treatment plan will need to be switched and patient will need to be scheduled for lab/MD/ folfox *new* after port is placed.

## 2020-01-02 NOTE — Progress Notes (Signed)
Pharmacist Chemotherapy Monitoring - Initial Assessment    Anticipated start date: 01/09/20   Regimen:  . Are orders appropriate based on the patient's diagnosis, regimen, and cycle? Yes . Does the plan date match the patient's scheduled date? Yes . Is the sequencing of drugs appropriate? Yes . Are the premedications appropriate for the patient's regimen? Yes . Prior Authorization for treatment is: Pending o If applicable, is the correct biosimilar selected based on the patient's insurance? not applicable  Organ Function and Labs: [name] Kitchen Are dose adjustments needed based on the patient's renal function, hepatic function, or hematologic function? No . Are appropriate labs ordered prior to the start of patient's treatment? Yes . Other organ system assessment, if indicated: N/A . The following baseline labs, if indicated, have been ordered: N/A  Dose Assessment: . Are the drug doses appropriate? Yes . Are the following correct: o Drug concentrations Yes o IV fluid compatible with drug Yes o Administration routes Yes o Timing of therapy Yes . If applicable, does the patient have documented access for treatment and/or plans for port-a-cath placement? not applicable . If applicable, have lifetime cumulative doses been properly documented and assessed? not applicable Lifetime Dose Tracking  . Oxaliplatin: 130.435 mg/m2 (240 mg) = 21.74 % of the maximum lifetime dose of 600 mg/m2  o   Toxicity Monitoring/Prevention: . The patient has the following take home antiemetics prescribed: Prochlorperazine and Dexamethasone . The patient has the following take home medications prescribed: N/A . Medication allergies and previous infusion related reactions, if applicable, have been reviewed and addressed. Yes . The patient's current medication list has been assessed for drug-drug interactions with their chemotherapy regimen. no significant drug-drug interactions were identified on review.  Order  Review: . Are the treatment plan orders signed? No . Is the patient scheduled to see a provider prior to their treatment? Yes  I verify that I have reviewed each item in the above checklist and answered each question accordingly.  Korissa Horsford K 01/02/2020 8:25 AM

## 2020-01-03 NOTE — Telephone Encounter (Signed)
Since treatment will be changed to Folfox, appts for lab/MD/oxaliplatin on 10/11 have been cancelled. Patient and patient's wife, [name], aware. They will see Dr. Genevive Bi on 10/8 to discuss port placement and will send Mychart message to let us know when port placement will be. They have picked up EMLA cream.

## 2020-01-04 ENCOUNTER — Ambulatory Visit
Payer: Medicare Other | Attending: Student in an Organized Health Care Education/Training Program | Admitting: Student in an Organized Health Care Education/Training Program

## 2020-01-04 ENCOUNTER — Telehealth: Payer: Self-pay | Admitting: *Deleted

## 2020-01-04 ENCOUNTER — Encounter: Payer: Self-pay | Admitting: Student in an Organized Health Care Education/Training Program

## 2020-01-04 ENCOUNTER — Other Ambulatory Visit: Payer: Self-pay

## 2020-01-04 DIAGNOSIS — M19011 Primary osteoarthritis, right shoulder: Secondary | ICD-10-CM

## 2020-01-04 DIAGNOSIS — M5136 Other intervertebral disc degeneration, lumbar region: Secondary | ICD-10-CM | POA: Diagnosis not present

## 2020-01-04 DIAGNOSIS — G894 Chronic pain syndrome: Secondary | ICD-10-CM

## 2020-01-04 DIAGNOSIS — M5416 Radiculopathy, lumbar region: Secondary | ICD-10-CM

## 2020-01-04 DIAGNOSIS — M48062 Spinal stenosis, lumbar region with neurogenic claudication: Secondary | ICD-10-CM

## 2020-01-04 NOTE — Progress Notes (Signed)
Patient: Blake [name]  Service Category: E/M  Provider: Gillis Santa, MD  DOB: [birth year]  DOS: 01/04/2020  Location: Office  MRN: [id number]  Setting: Ambulatory outpatient  Referring Provider: Idelle Crouch, MD  Type: Established Patient  Specialty: Interventional Pain Management  PCP: Idelle Crouch, MD  Location: Home  Delivery: TeleHealth     Virtual Encounter - Pain Management PROVIDER NOTE: Information contained herein reflects review and annotations entered in association with encounter. Interpretation of such information and data should be left to medically-trained personnel. Information provided to patient can be located elsewhere in the medical record under "Patient Instructions". Document created using STT-dictation technology, any transcriptional errors that may result from process are unintentional.    Contact & Pharmacy Preferred: [phone] Home: [phone] (home) Mobile: [phone] (mobile) E-mail: [email or web address]  [facility] - [address], [place] [address] Phone: [phone] Fax: [phone]  [place], [place] [address] Phone: [phone] Fax: [place], [place] [address] [place] [address] Phone: [phone] Fax: [place], [place] - [address] [place] [address] Phone: [phone] Fax: [phone]   Pre-screening  Blake [name] offered "in-person" vs "virtual" encounter. He indicated preferring virtual for this encounter.   Reason COVID-19*  Social distancing based on CDC and AMA recommendations.   I contacted Blake [name] on 01/04/2020 via video conference.      I clearly identified myself as Gillis Santa, MD. I verified that I was  speaking with the correct person using two identifiers (Name: Blake [name], and date of birth: Blake [name], Blake [name]).  Consent I sought verbal advanced consent from Blake [name] for virtual visit interactions. I informed Blake [name], risks, and limitations associated with providing "not-in-person" medical evaluation and management services. I also informed Blake [name]. Finally, I informed him that there would be a charge for the virtual visit and that he could be  personally, fully or partially, financially responsible for it. Blake [name] and agreed to proceed.   Historic Elements   Blake [name] is a 74 y.o. year old, male patient evaluated today after our last contact on 12/14/2019. Blake [name]  has a past medical history of Arthritis, Back pain, Basal cell carcinoma (09/04/2009), Basal cell carcinoma (07/31/2014), Basal cell carcinoma (07/30/2016), Cancer [facility]), 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, bilateral, Coronary artery disease, Dyspnea, GERD (gastroesophageal reflux disease), Hyperlipidemia, Hypertension, Numbness and tingling of both lower extremities, and Prostate enlargement. He also  has a past surgical history that includes Partial hip arthroplasty (2014); Tongue Biopsy (2006); Port-a-cath removal; Colonoscopy (12/08/2003); Tonsillectomy; Upper gi endoscopy (01/25/14); Lumbar laminectomy/decompression microdiscectomy (N/A, 03/03/2018); Joint replacement (Right); Trigger finger release (Right, 2013); Colonoscopy with propofol (N/A, 03/21/2019); Esophagogastroduodenoscopy (egd) with propofol (N/A, 03/21/2019); esophogeal cancer; and Abdominal surgery. Blake [name] has a current medication list which includes the following prescription(s): amiodarone, aspirin ec, atorvastatin, one touch ultra system kit, lidocaine-prilocaine, metformin, metoprolol succinate, chemo transfer pin,  omeprazole, promethazine, tamsulosin, meloxicam, methocarbamol, oxycodone, and [DISCONTINUED] prochlorperazine. He  reports that he quit smoking about 25 years ago. His smoking use included cigarettes. He has a 30.00 pack-year smoking history. He has never used smokeless tobacco. He reports current alcohol use. He  reports that he does not use drugs. Blake [name] is allergic to sulfa antibiotics.   HPI  Today, he is being contacted for a post-procedure assessment.   Post-Procedure Evaluation  Procedure (12/14/2019):   Type: Diagnostic Epidural Steroid Injection #1  Region: Caudal Level: Sacrococcygeal   Laterality: Midline aiming at the left  Sedation: Please see nurses note.  Effectiveness during initial hour after procedure(Ultra-Short Term Relief): 100 %   Local anesthetic used: Long-acting (4-6 hours) Effectiveness: Defined as any analgesic benefit obtained secondary to the administration of local anesthetics. This carries significant diagnostic value as to the etiological location, or anatomical origin, of the pain. Duration of benefit is expected to coincide with the duration of the local anesthetic used.  Effectiveness during initial 4-6 hours after procedure(Short-Term Relief): 100 %   Long-term benefit: Defined as any relief past the pharmacologic duration of the local anesthetics.  Effectiveness past the initial 6 hours after procedure(Long-Term Relief): 40 % (walking without cane)  Current benefits: Defined as benefit that persist at this time.   Analgesia:  30% Function: Somewhat improved ROM: Somewhat improved   Laboratory Chemistry Profile   Renal Lab Results  Component Value Date   BUN 15 12/28/2019   CREATININE 1.07 12/28/2019   GFRAA >60 12/28/2019   GFRNONAA >60 12/28/2019     Hepatic Lab Results  Component Value Date   AST 18 12/28/2019   ALT 16 12/28/2019   ALBUMIN 3.4 (L) 12/28/2019   ALKPHOS 56 12/28/2019     Electrolytes Lab Results  Component Value  Date   NA 137 12/28/2019   K 4.1 12/28/2019   CL 102 12/28/2019   CALCIUM 8.6 (L) 12/28/2019     Bone No results found for: VD25OH, [id number], OJ5009FG1, WE9937JI9, 25OHVITD1, 25OHVITD2, 25OHVITD3, TESTOFREE, TESTOSTERONE   Inflammation (CRP: Acute Phase) (ESR: Chronic Phase) Lab Results  Component Value Date   ESRSEDRATE 1 01/12/2013       Note: Above Lab results reviewed.   Assessment  The primary encounter diagnosis was Lumbar radiculopathy. Diagnoses of Spinal stenosis, lumbar region, with neurogenic claudication, Lumbar degenerative disc disease, Primary osteoarthritis of right shoulder, Chronic pain syndrome, and Other intervertebral disc degeneration, lumbar region were also pertinent to this visit.  Plan of Care  Mr. DRESDEN LOZITO has a current medication list which includes the following long-term medication(s): amiodarone, atorvastatin, metformin, metoprolol succinate, omeprazole, promethazine, and [DISCONTINUED] prochlorperazine.   Post procedure evaluation after caudal epidural steroid injection.  Patient endorses pain relief after caudal ESI however states that the pain relief is decreasing.  He rates approximately 40% pain improvement when he is sitting in approximately 25 to 30% pain improvement when he is ambulating.  He continues to have pain that radiates down bilateral legs, left greater than right.  Of note he is walking without a cane which I am pleased with.  I recommend repeating caudal epidural steroid injection #2.  Furthermore I also recommend obtaining lumbar MRI given worsening lumbar radicular pain.  Of note patient has not had a repeat lumbar MRI after his lumbar laminectomy and microdiscectomy on 03/03/2018.  His pain is getting worse and I would like to evaluate for any stenosis, disc herniation, nerve root irritation.  Future considerations could include spinal cord stimulator trial which we will discuss further.  1.  Repeat caudal ESI #2 2.  MRI  lumbar spine without contrast 3.  Consider SCS in future   Orders:  Orders Placed This Encounter  Procedures  . Caudal Epidural Injection  Standing Status:   Future    Standing Expiration Date:   02/04/2020    Scheduling Instructions:     Laterality: Midline     Level(s): Sacrococcygeal canal (Tailbone area)     Sedation: without     Scheduling Timeframe: As soon as pre-approved    Order Specific Question:   Where will this procedure be performed?    Answer:   [facility]  . MR LUMBAR SPINE WO CONTRAST    Patient presents with axial pain with possible radicular component.  In addition to any acute findings, please report on:  1. Facet (Zygapophyseal) joint DJD (Hypertrophy, space narrowing, subchondral sclerosis, and/or osteophyte formation) 2. DDD and/or IVDD (Loss of disc height, desiccation or "Black disc disease") 3. Pars defects 4. Spondylolisthesis, spondylosis, and/or spondyloarthropathies (include Degree/Grade of displacement in mm) 5. Vertebral body Fractures, including age (old, new/acute) 60. Modic Type Changes 7. Demineralization 8. Bone pathology 9. Central, Lateral Recess, and/or Foraminal Stenosis (include AP diameter of stenosis in mm) 10. Surgical changes (hardware type, status, and presence of fibrosis)  NOTE: Please specify level(s) and laterality.    Standing Status:   Future    Standing Expiration Date:   04/05/2020    Order Specific Question:   What is the patient's sedation requirement?    Answer:   No Sedation    Order Specific Question:   Does the patient have a pacemaker or implanted devices?    Answer:   No    Order Specific Question:   Preferred imaging location?    Answer:   [facility] (table limit-350lbs)    Order Specific Question:   Call Results- Best Contact Number?    Answer:   [phone] ([facility])    Order Specific Question:   Radiology Contrast Protocol - do NOT remove file path    Answer:    \\[email or web address]   Follow-up plan:   Return in about 2 weeks (around 01/18/2020) for caudal #2 , without sedation.     Series of 3 L4-L5 epidural steroid injections followed by L3-L4 and L4-L5 microdiscectomy and laminectomy by Dr. Cari Caraway December 2019.  Return of left radicular pain given immobility after surgery for esophageal cancer.  caudal ESI, left 12/14/2019.     Recent Visits Date Type Provider Dept  12/14/19 Procedure visit Gillis Santa, MD [facility]  12/08/19 Office Visit Gillis Santa, MD [facility]  Showing recent visits within past 90 days and meeting all other requirements Today's Visits Date Type Provider Dept  01/04/20 Telemedicine Gillis Santa, MD [facility]  Showing today's visits and meeting all other requirements Future [name] No visits were found meeting these conditions. Showing future [name] within next 90 days and meeting all other requirements  I discussed the assessment and treatment plan with the patient. The patient was provided an opportunity to ask questions and all were answered. The patient agreed with the plan and demonstrated an [name] of the instructions.  Patient advised to call back or seek an in-person evaluation if the symptoms or condition worsens.  Duration of encounter: 64mnutes.  Note by: BGillis Santa MD Date: 01/04/2020; Time: 3:09 PM

## 2020-01-06 ENCOUNTER — Ambulatory Visit (INDEPENDENT_AMBULATORY_CARE_PROVIDER_SITE_OTHER): Payer: Medicare Other | Admitting: Cardiothoracic Surgery

## 2020-01-06 ENCOUNTER — Other Ambulatory Visit: Payer: Self-pay

## 2020-01-06 ENCOUNTER — Encounter: Payer: Self-pay | Admitting: Cardiothoracic Surgery

## 2020-01-06 VITALS — Ht 65.0 in | Wt 158.0 lb

## 2020-01-06 DIAGNOSIS — C155 Malignant neoplasm of lower third of esophagus: Secondary | ICD-10-CM | POA: Diagnosis not present

## 2020-01-06 NOTE — Progress Notes (Signed)
Patient ID: KYIAN OBST, male   DOB: 1945/05/11, 74 y.o.   MRN: [id number]  Chief Complaint  Patient presents with   New Patient (Initial Visit)    Port Placement    Referred By Dr. Tasia Catchings Reason for Referral port a cath insertion  HPI Location, Quality, Duration, Severity, Timing, Context, Modifying Factors, Associated Signs and Symptoms.  Blake [name] is a 74 y.o. male.  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  In June of this year he underwent a laparotomy with a right thoracotomy and esophageal resection.  He is now to receive postoperative chemotherapy.  He had his chemotherapy but now requires a port for additional chemotherapy.  He had a similar port placed many years ago by Dr. Acquanetta Belling for head neck cancer.  This was on the right side.  He has no other complaints today.  He does not take any anticoagulants.  He states that he is scheduled to start his chemotherapy next week.   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, bilateral    Coronary artery disease    Dyspnea    GERD (gastroesophageal reflux disease)    Hyperlipidemia    Hypertension    Numbness and tingling of both lower extremities    with positioning   Prostate enlargement     Past Surgical History:  Procedure Laterality Date   ABDOMINAL SURGERY     COLONOSCOPY  12/08/2003   COLONOSCOPY WITH PROPOFOL N/A 03/21/2019   Procedure: COLONOSCOPY WITH PROPOFOL;  Surgeon: Toledo, Benay Pike, MD;  Location: [facility];  Service: Gastroenterology;  Laterality: N/A;    ESOPHAGOGASTRODUODENOSCOPY (EGD) WITH PROPOFOL N/A 03/21/2019   Procedure: ESOPHAGOGASTRODUODENOSCOPY (EGD) WITH PROPOFOL;  Surgeon: Toledo, Benay Pike, MD;  Location: [facility];  Service: Gastroenterology;  Laterality: N/A;   esophogeal cancer     JOINT REPLACEMENT Right    total hip   LUMBAR LAMINECTOMY/DECOMPRESSION MICRODISCECTOMY N/A 03/03/2018   Procedure: LUMBAR LAMINECTOMY/DECOMPRESSION MICRODISCECTOMY 1 LEVEL-L3-4,L4-5;  Surgeon: Meade Maw, MD;  Location: [facility];  Service: Neurosurgery;  Laterality: N/A;   PARTIAL HIP ARTHROPLASTY  2014   PORT-A-CATH REMOVAL     TONGUE BIOPSY  2006   TONSILLECTOMY     TRIGGER FINGER RELEASE Right 2013   UPPER GI ENDOSCOPY  01/25/14   multiple gastric polyps    Family History  Problem Relation Age of Onset   Diabetes Mother    Congestive Heart Failure Mother    Breast cancer Mother    Lung cancer Father     Social History Social History   Tobacco Use   Smoking status: Former Smoker    Packs/day: 1.00    Years: 30.00    Pack years: 30.00    Types: Cigarettes    Quit date: 02/18/1994    Years since quitting: 25.8   Smokeless tobacco: Never Used  Vaping Use   Vaping Use: Never used  Substance Use Topics   Alcohol use: Yes   Drug use: No    Allergies  Allergen Reactions   Sulfa Antibiotics Rash    Current Outpatient Medications  Medication Sig Dispense Refill   amiodarone (PACERONE) 200 MG tablet Take 200 mg by mouth daily.     aspirin EC 81 MG tablet Take 81 mg by mouth daily.     Blood Glucose Monitoring Suppl (ONE TOUCH ULTRA SYSTEM KIT) w/Device KIT      lidocaine-prilocaine (EMLA) cream Apply to affected area once 30 g 3   metFORMIN (GLUCOPHAGE) 500 MG tablet Take 500 mg by mouth 2 (two) times daily.     metoprolol succinate (TOPROL-XL) 50 MG 24 hr tablet Take 50 mg by mouth daily.      Misc. Devices (CHEMO TRANSFER PIN) MISC by Does not apply route.     omeprazole (PRILOSEC)  40 MG capsule Take 40 mg by mouth in the morning and at bedtime.   3   oxyCODONE (OXY IR/ROXICODONE) 5 MG immediate release tablet Take 1 tablet (5 mg total) by mouth every 4 (four) hours as needed for moderate pain ((score 4 to 6)). 30 tablet 0   promethazine (PHENERGAN) 25 MG tablet Take 1 tablet (25 mg total) by mouth every 6 (six) hours as needed for nausea or vomiting. 30 tablet 0   tamsulosin (FLOMAX) 0.4 MG CAPS capsule Take 0.4 mg by mouth daily after supper.      No current facility-administered medications for this visit.      Review of Systems A complete review of systems was asked and was negative except for the following positive findings none  Height _0  (1.651 m), weight 158 lb (71.7 kg).  Physical Exam CONSTITUTIONAL:  Pleasant, well-developed, well-nourished, and in no acute distress. EYES: Pupils equal and reactive to light, Sclera non-icteric EARS, NOSE, MOUTH AND THROAT:  The oropharynx was clear.  Dentition is good repair.  Oral mucosa pink and moist. LYMPH NODES:  Lymph nodes in the neck and axillae were normal RESPIRATORY:  Lungs were clear.  Normal respiratory effort without pathologic use of accessory muscles of respiration CARDIOVASCULAR: Heart was regular without murmurs.  There were no carotid bruits. GI: The abdomen was soft, nontender, and nondistended. There were no palpable masses. There was no hepatosplenomegaly. There were normal bowel sounds in all quadrants. GU:  Rectal deferred.   MUSCULOSKELETAL:  Normal muscle strength and tone.  No clubbing or cyanosis.   SKIN:  There were no pathologic skin lesions.  There were no nodules on palpation.  There are healed scars from a laparotomy and right thoracotomy NEUROLOGIC:  Sensation is normal.  Cranial nerves are grossly intact. PSYCH:  Oriented to person, place and time.  Mood and affect are normal.  Data Reviewed None  I have personally reviewed the patient's imaging, laboratory findings and medical  records.    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  Risks of bleeding, infection, pneumothorax and death were all reviewed.  He would like to start his chemotherapy next week and will place the catheter at that time.  I told him that we would do an ultrasound of his neck to see if there is any suitable place for port insertion.  If so we will plan on a right internal jugular port.  Otherwise we will proceed with a left subclavian.  All questions were answered.       Nestor Lewandowsky, MD 01/06/2020, 10:20 AM

## 2020-01-06 NOTE — H&P (View-Only) (Signed)
Patient ID: Blake [name], male   DOB: 1945-11-01, 74 y.o.   MRN: [id number]  Chief Complaint  Patient presents with  . New Patient (Initial Visit)    Port Placement    Referred By Dr. Tasia Catchings Reason for Referral port a cath insertion  HPI Location, Quality, Duration, Severity, Timing, Context, Modifying Factors, Associated Signs and Symptoms.  Blake [name] is a 74 y.o. male.  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  In June of this year he underwent a laparotomy with a right thoracotomy and esophageal resection.  He is now to receive postoperative chemotherapy.  He had his chemotherapy but now requires a port for additional chemotherapy.  He had a similar port placed many years ago by Dr. Acquanetta Belling for head neck cancer.  This was on the right side.  He has no other complaints today.  He does not take any anticoagulants.  He states that he is scheduled to start his chemotherapy next week.   Past Medical History:  Diagnosis Date  . Arthritis   . Back pain    with leg pain  . Basal cell carcinoma 09/04/2009   right cheek 5.5 cm ant to earlobe, right ant nasal alar rim  . Basal cell carcinoma 07/31/2014   right mid brow  . Basal cell carcinoma 07/30/2016   right ant nasal alar rim at ant edge of BCC scar  . 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 Coronary artery disease   . Dyspnea   . GERD (gastroesophageal reflux disease)   . Hyperlipidemia   . Hypertension   . Numbness and tingling of both lower extremities    with positioning  . Prostate enlargement     Past Surgical History:  Procedure Laterality Date  . ABDOMINAL SURGERY    . COLONOSCOPY  12/08/2003  . COLONOSCOPY WITH PROPOFOL N/A 03/21/2019   Procedure: COLONOSCOPY WITH PROPOFOL;  Surgeon: Toledo, Benay Pike, MD;  Location: [facility];  Service: Gastroenterology;  Laterality: N/A;  .  ESOPHAGOGASTRODUODENOSCOPY (EGD) WITH PROPOFOL N/A 03/21/2019   Procedure: ESOPHAGOGASTRODUODENOSCOPY (EGD) WITH PROPOFOL;  Surgeon: Toledo, Benay Pike, MD;  Location: [facility];  Service: Gastroenterology;  Laterality: N/A;  . esophogeal cancer    . JOINT REPLACEMENT Right    total hip  . LUMBAR LAMINECTOMY/DECOMPRESSION MICRODISCECTOMY N/A 03/03/2018   Procedure: LUMBAR LAMINECTOMY/DECOMPRESSION MICRODISCECTOMY 1 LEVEL-L3-4,L4-5;  Surgeon: Meade Maw, MD;  Location: [facility];  Service: Neurosurgery;  Laterality: N/A;  . PARTIAL HIP ARTHROPLASTY  2014  . PORT-A-CATH REMOVAL    . TONGUE BIOPSY  2006  . TONSILLECTOMY    . TRIGGER FINGER RELEASE Right 2013  . UPPER GI ENDOSCOPY  01/25/14   multiple gastric polyps    Family History  Problem Relation Age of Onset  . Diabetes Mother   . Congestive Heart Failure Mother   . Breast cancer Mother   . Lung cancer Father     Social History Social History   Tobacco Use  . Smoking status: Former Smoker    Packs/day: 1.00    Years: 30.00    Pack years: 30.00    Types: Cigarettes    Quit date: 02/18/1994    Years since quitting: 25.8  . Smokeless tobacco: Never Used  Vaping Use  . Vaping Use: Never used  Substance Use Topics  . Alcohol use: Yes  . Drug use: No    Allergies  Allergen Reactions  . Sulfa Antibiotics Rash    Current Outpatient Medications  Medication Sig Dispense Refill  . amiodarone (PACERONE) 200 MG tablet Take 200 mg by mouth daily.    [name] Kitchen aspirin EC 81 MG tablet Take 81 mg by mouth daily.    . Blood Glucose Monitoring Suppl (ONE TOUCH ULTRA SYSTEM KIT) w/Device KIT     . lidocaine-prilocaine (EMLA) cream Apply to affected area once 30 g 3  . metFORMIN (GLUCOPHAGE) 500 MG tablet Take 500 mg by mouth 2 (two) times daily.    . metoprolol succinate (TOPROL-XL) 50 MG 24 hr tablet Take 50 mg by mouth daily.     . Misc. Devices (CHEMO TRANSFER PIN) MISC by Does not apply route.    [name] Kitchen omeprazole (PRILOSEC)  40 MG capsule Take 40 mg by mouth in the morning and at bedtime.   3  . oxyCODONE (OXY IR/ROXICODONE) 5 MG immediate release tablet Take 1 tablet (5 mg total) by mouth every 4 (four) hours as needed for moderate pain ((score 4 to 6)). 30 tablet 0  . promethazine (PHENERGAN) 25 MG tablet Take 1 tablet (25 mg total) by mouth every 6 (six) hours as needed for nausea or vomiting. 30 tablet 0  . tamsulosin (FLOMAX) 0.4 MG CAPS capsule Take 0.4 mg by mouth daily after supper.      No current facility-administered medications for this visit.      Review of Systems A complete review of systems was asked and was negative except for the following positive findings none  Height _0  (1.651 m), weight 158 lb (71.7 kg).  Physical Exam CONSTITUTIONAL:  Pleasant, well-developed, well-nourished, and in no acute distress. EYES: Pupils equal and reactive to light, Sclera non-icteric EARS, NOSE, MOUTH AND THROAT:  The oropharynx was clear.  Dentition is good repair.  Oral mucosa pink and moist. LYMPH NODES:  Lymph nodes in the neck and axillae were normal RESPIRATORY:  Lungs were clear.  Normal respiratory effort without pathologic use of accessory muscles of respiration CARDIOVASCULAR: Heart was regular without murmurs.  There were no carotid bruits. GI: The abdomen was soft, nontender, and nondistended. There were no palpable masses. There was no hepatosplenomegaly. There were normal bowel sounds in all quadrants. GU:  Rectal deferred.   MUSCULOSKELETAL:  Normal muscle strength and tone.  No clubbing or cyanosis.   SKIN:  There were no pathologic skin lesions.  There were no nodules on palpation.  There are healed scars from a laparotomy and right thoracotomy NEUROLOGIC:  Sensation is normal.  Cranial nerves are grossly intact. PSYCH:  Oriented to person, place and time.  Mood and affect are normal.  Data Reviewed None  I have personally reviewed the patient's imaging, laboratory findings and medical  records.    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  Risks of bleeding, infection, pneumothorax and death were all reviewed.  He would like to start his chemotherapy next week and will place the catheter at that time.  I told him that we would do an ultrasound of his neck to see if there is any suitable place for port insertion.  If so we will plan on a right internal jugular port.  Otherwise we will proceed with a left subclavian.  All questions were answered.       Nestor Lewandowsky, MD 01/06/2020, 10:20 AM

## 2020-01-06 NOTE — Patient Instructions (Addendum)
We have seen you today and have spoken about your port placement. This has been scheduled at [facility] by Dr. Genevive Bi.  Stop your Aspirin one week prior to surgery.  Please see the [facility]) Sheet provided for further details. Our surgery scheduler will call you to look at surgery dates and go over information.   Please call our office with any questions or concerns that you have.    Implanted Port Insertion Implanted port insertion is a procedure to put in a port and catheter. The port is a device with an injectable disk that can be accessed by your health care provider. The port is connected to a vein in the chest or neck by a small flexible tube (catheter). There are different types of ports. The implanted port may be used as a long-term IV access for:  Medicines, such as chemotherapy.  Fluids.  Liquid nutrition, such as total parenteral nutrition (TPN). When you have a port, this means that your health care provider will not need to use the veins in your arms for these procedures. Tell a health care provider about:  Any allergies you have.  All medicines you are taking, especially blood thinners, as well as any vitamins, herbs, eye drops, creams, over-the-counter medicines, and steroids.  Any problems you or family members have had with anesthetic medicines.  Any blood disorders you have.  Any surgeries you have had.  Any medical conditions you have or have had, including diabetes or kidney problems.  Whether you are pregnant or may be pregnant. What are the risks? Generally, this is a safe procedure. However, problems may occur, including:  Allergic reactions to medicines or dyes.  Damage to other structures or organs.  Infection.  Damage to the blood vessel, bruising, or bleeding at the puncture site.  Blood clot.  Breakdown of the skin over the port.  A collection of air in the chest that can cause one of the lungs to collapse (pneumothorax). This is rare. What  happens before the procedure? Medicines 1. Ask your health care provider about: ? Changing or stopping your regular medicines. This is especially important if you are taking diabetes medicines or blood thinners. ? Taking medicines such as aspirin and ibuprofen. These medicines can thin your blood. Do not take these medicines unless your health care provider tells you to take them. ? Taking over-the-counter medicines, vitamins, herbs, and supplements. Staying hydrated Follow instructions from your health care provider about hydration, which may include:  Up to 2 hours before the procedure - you may continue to drink clear liquids, such as water, clear fruit juice, black coffee, and plain tea.  Eating and drinking restrictions 1. Follow instructions from your health care provider about eating and drinking, which may include: ? 8 hours before the procedure - stop eating heavy meals or foods, such as meat, fried foods, or fatty foods. ? 6 hours before the procedure - stop eating light meals or foods, such as toast or cereal. ? 6 hours before the procedure - stop drinking milk or drinks that contain milk. ? 2 hours before the procedure - stop drinking clear liquids. General instructions 1. Plan to have someone take you home from the hospital or clinic. 2. If you will be going home right after the procedure, plan to have someone with you for 24 hours. 3. You may have blood tests. 4. Do not use any products that contain nicotine or tobacco for at least 4-6 weeks before the procedure. These products include  cigarettes, e-cigarettes, and chewing tobacco. If you need help quitting, ask your health care provider. 5. Ask your health care provider what steps will be taken to help prevent infection. These may include: ? Removing hair at the surgery site. ? Washing skin with a germ-killing soap. ? Taking antibiotic medicine. What happens during the procedure?  1. An IV will be inserted into one of your  veins. 2. You will be given one or more of the following: ? A medicine to help you relax (sedative). ? A medicine to numb the area (local anesthetic). 3. Two small incisions will be made to insert the port. ? One smaller incision will be made in your neck to get access to the vein where the catheter will lie. ? The other incision will be made in the upper chest. This is where the port will lie. 4. The procedure may be done using continuous X-ray (fluoroscopy) or other imaging tools for guidance. 5. The port and catheter will be placed. There may be a small, raised area where the port is. 6. The port will be flushed with a salt solution (saline), and blood will be drawn to make sure that it is working correctly. 7. The incisions will be closed. 8. Bandages (dressings) may be placed over the incisions. The procedure may vary among health care providers and hospitals. What happens after the procedure? 1. Your blood pressure, heart rate, breathing rate, and blood oxygen level will be monitored until you leave the hospital or clinic. 2. Do not drive for 24 hours if you were given a sedative during your procedure. 3. You will be given a manufacturer's information card for the type of port that you have. Keep this with you. 4. Your port will need to be flushed and checked as told by your health care provider, usually every few weeks. 5. A chest X-ray will be done to: ? Check the placement of the port. ? Make sure there is no injury to your lung. Summary  Implanted port insertion is a procedure to put in a port and catheter.  The implanted port is used as a long-term IV access.  The port will need to be flushed and checked as told by your health care provider, usually every few weeks.  Keep your manufacturer's information card with you at all times. This information is not intended to replace advice given to you by your health care provider. Make sure you discuss any questions you have with  your health care provider. Document Revised: 07/09/2018 Document Reviewed: 10/13/2017 Elsevier Patient Education  [place].

## 2020-01-07 ENCOUNTER — Encounter: Payer: Self-pay | Admitting: Oncology

## 2020-01-09 ENCOUNTER — Inpatient Hospital Stay: Payer: Medicare Other

## 2020-01-09 ENCOUNTER — Telehealth: Payer: Self-pay | Admitting: *Deleted

## 2020-01-09 ENCOUNTER — Inpatient Hospital Stay: Payer: Medicare Other | Admitting: Oncology

## 2020-01-09 ENCOUNTER — Telehealth: Payer: Self-pay | Admitting: Cardiothoracic Surgery

## 2020-01-09 NOTE — Telephone Encounter (Signed)
Done.. Pt has been scheduled as requested Pt will RTC on 10/19 pt is aware of appt date and time

## 2020-01-09 NOTE — Telephone Encounter (Signed)
Please schedule lab/MD/Folfox/D3 d/c pump *new* on 01/16/20 (date is pt request) and inform patient of appt detials.

## 2020-01-09 NOTE — Telephone Encounter (Signed)
Yes. Please adjust his schedule.thanks.

## 2020-01-09 NOTE — Telephone Encounter (Signed)
Patient sent a MyChart message to inform Dr. Tasia Catchings that he is scheduled for port placement on 10/14 and request his tx be scheduled for Friday or the next Monday after port placement.  OK to get him scheduled for the lab/MD/Folfox/D3 d/c pump?

## 2020-01-09 NOTE — Telephone Encounter (Signed)
Received call back from patient and he is aware of dates regarding his surgery and voices understanding.

## 2020-01-09 NOTE — Telephone Encounter (Signed)
Outgoing call made, left message for patient to call.  Please advise patient of Pre-Admission date/time, COVID Testing date and Surgery date.  Surgery Date: 01/12/20 Preadmission Testing Date: 01/10/20 (to be there in person at 9:00 am) Covid Testing Date: 01/10/20- patient advised to go to the [place] ([place]) between 8a-1p   Also patient needs to call [phone], between 1-3:00pm the day before surgery, to find out what time to arrive for surgery.

## 2020-01-10 ENCOUNTER — Other Ambulatory Visit: Payer: Medicare Other

## 2020-01-10 ENCOUNTER — Ambulatory Visit
Admission: RE | Admit: 2020-01-10 | Discharge: 2020-01-10 | Disposition: A | Discharge status: Home / Self Care | Payer: Medicare Other | Source: Ambulatory Visit | Attending: Cardiothoracic Surgery | Admitting: Cardiothoracic Surgery

## 2020-01-10 ENCOUNTER — Encounter
Admission: RE | Admit: 2020-01-10 | Discharge: 2020-01-10 | Disposition: A | Discharge status: Home / Self Care | Payer: Medicare Other | Source: Ambulatory Visit | Attending: Cardiothoracic Surgery | Admitting: Cardiothoracic Surgery

## 2020-01-10 ENCOUNTER — Other Ambulatory Visit: Payer: Self-pay

## 2020-01-10 DIAGNOSIS — Z01810 Encounter for preprocedural cardiovascular examination: Secondary | ICD-10-CM

## 2020-01-10 DIAGNOSIS — Z01818 Encounter for other preprocedural examination: Secondary | ICD-10-CM | POA: Insufficient documentation

## 2020-01-10 DIAGNOSIS — C159 Malignant neoplasm of esophagus, unspecified: Secondary | ICD-10-CM | POA: Diagnosis not present

## 2020-01-10 DIAGNOSIS — J984 Other disorders of lung: Secondary | ICD-10-CM | POA: Diagnosis not present

## 2020-01-10 DIAGNOSIS — Z20822 Contact with and (suspected) exposure to covid-19: Secondary | ICD-10-CM | POA: Diagnosis not present

## 2020-01-10 HISTORY — DX: Type 2 diabetes mellitus without complications: E11.9

## 2020-01-10 HISTORY — DX: Unspecified malignant neoplasm of skin, unspecified: C44.90

## 2020-01-10 HISTORY — DX: Cardiac arrhythmia, unspecified: I49.9

## 2020-01-10 LAB — APTT: aPTT: 32 seconds (ref 24–36)

## 2020-01-10 LAB — PROTIME-INR
INR: 1 (ref 0.8–1.2)
Prothrombin Time: 12.7 seconds (ref 11.4–15.2)

## 2020-01-10 NOTE — Patient Instructions (Signed)
Your procedure is scheduled on: Thurs 10/14 Report to Day Surgery. To find out your arrival time please call [phone] between 1PM - 3PM on Wed. 10/13.  Remember: Instructions that are not followed completely may result in serious medical risk,  up to and including death, or upon the discretion of your surgeon and anesthesiologist your  surgery may need to be rescheduled.     _X__ 1. Do not eat food after midnight the night before your procedure.                 No chewing gum or hard candies. You may drink clear liquids up to 2 hours                 before you are scheduled to arrive for your surgery- DO not drink clear                 liquids within 2 hours of the start of your surgery.                 Clear Liquids include:  water, G2 or                  Gatorade Zero (avoid Red/Purple/Blue), Black Coffee or Tea (Do not add                 anything to coffee or tea). _____2.   Complete the "Ensure Clear Pre-surgery Clear Carbohydrate Drink" provided to you, 2 hours before arrival. **If you       are diabetic you will be provided with an alternative drink, Gatorade Zero or G2.  __X__2.  On the morning of surgery brush your teeth with toothpaste and water, you                may rinse your mouth with mouthwash if you wish.  Do not swallow any toothpaste of mouthwash.     _X__ 3.  No Alcohol for 24 hours before or after surgery.   ___ 4.  Do Not Smoke or use e-cigarettes For 24 Hours Prior to Your Surgery.                 Do not use any chewable tobacco products for at least 6 hours prior to                 Surgery.  ___  5.  Do not use any recreational drugs (marijuana, cocaine, heroin, ecstasy, MDMA or other)                For at least one week prior to your surgery.  Combination of these drugs with anesthesia                May have life threatening results.  ____  6.  Bring all medications with you on the day of surgery if instructed.   __x__  7.   Notify your doctor if there is any change in your medical condition      (cold, fever, infections).     Do not wear jewelry,  Do not wear lotions,  You may wear deodorant. Do not shave 48 hours prior to surgery. Men may shave face and neck. Do not bring valuables to the hospital.    [facility] is not responsible for any belongings or valuables.  Contacts, dentures or bridgework may not be worn into surgery. Leave your suitcase in the car. After surgery it may be brought to your room.  For patients admitted to the hospital, discharge time is determined by your treatment team.   Patients discharged the day of surgery will not be allowed to drive home.   Make arrangements for someone to be with you for the first 24 hours of your Same Day Discharge.    Please read over the following fact sheets that you were given:    __x__ Take these medicines the morning of surgery with A SIP OF WATER:    1.amiodarone (PACERONE) 200 MG tablet   2. metoprolol succinate (TOPROL-XL) 50 MG 24 hr tablet  3. omeprazole (PRILOSEC) 40 MG capsule  4.  5.  6.  ____ Fleet Enema (as directed)   __x__ Use CHG Soap (or wipes) as directed  ____ Use Benzoyl Peroxide Gel as instructed  ____ Use inhalers on the day of surgery  __x__ Stop metformin 2 days prior to surgery today   ____ Take 1/2 of usual insulin dose the night before surgery. No insulin the morning          of surgery.   __x__ Stopped  aspirin already  ____ Stop Anti-inflammatories on   ____ Stop supplements until after surgery.    ____ Bring C-Pap to the hospital.    If you have any questions regarding your pre-procedure instructions,  Please call Pre-admit Testing at [place]

## 2020-01-11 LAB — SARS CORONAVIRUS 2 (TAT 6-24 HRS): SARS Coronavirus 2: NEGATIVE

## 2020-01-12 ENCOUNTER — Ambulatory Visit: Payer: Medicare Other

## 2020-01-12 ENCOUNTER — Other Ambulatory Visit: Payer: Self-pay

## 2020-01-12 ENCOUNTER — Encounter: Payer: Self-pay | Admitting: Cardiothoracic Surgery

## 2020-01-12 ENCOUNTER — Encounter
Admission: RE | Disposition: A | Discharge status: Home / Self Care | Payer: Self-pay | Source: Home / Self Care | Attending: Cardiothoracic Surgery

## 2020-01-12 ENCOUNTER — Ambulatory Visit: Payer: Medicare Other | Admitting: Anesthesiology

## 2020-01-12 ENCOUNTER — Ambulatory Visit
Admission: RE | Admit: 2020-01-12 | Discharge: 2020-01-12 | Disposition: A | Discharge status: Home / Self Care | Payer: Medicare Other | Attending: Cardiothoracic Surgery | Admitting: Cardiothoracic Surgery

## 2020-01-12 ENCOUNTER — Telehealth: Payer: Self-pay | Admitting: *Deleted

## 2020-01-12 DIAGNOSIS — Z452 Encounter for adjustment and management of vascular access device: Secondary | ICD-10-CM | POA: Diagnosis present

## 2020-01-12 DIAGNOSIS — Z7984 Long term (current) use of oral hypoglycemic drugs: Secondary | ICD-10-CM | POA: Insufficient documentation

## 2020-01-12 DIAGNOSIS — Z79899 Other long term (current) drug therapy: Secondary | ICD-10-CM | POA: Insufficient documentation

## 2020-01-12 DIAGNOSIS — E785 Hyperlipidemia, unspecified: Secondary | ICD-10-CM | POA: Diagnosis not present

## 2020-01-12 DIAGNOSIS — M1990 Unspecified osteoarthritis, unspecified site: Secondary | ICD-10-CM | POA: Insufficient documentation

## 2020-01-12 DIAGNOSIS — K219 Gastro-esophageal reflux disease without esophagitis: Secondary | ICD-10-CM | POA: Insufficient documentation

## 2020-01-12 DIAGNOSIS — I10 Essential (primary) hypertension: Secondary | ICD-10-CM | POA: Diagnosis not present

## 2020-01-12 DIAGNOSIS — C159 Malignant neoplasm of esophagus, unspecified: Secondary | ICD-10-CM | POA: Insufficient documentation

## 2020-01-12 DIAGNOSIS — Z7982 Long term (current) use of aspirin: Secondary | ICD-10-CM | POA: Insufficient documentation

## 2020-01-12 DIAGNOSIS — Z87891 Personal history of nicotine dependence: Secondary | ICD-10-CM | POA: Insufficient documentation

## 2020-01-12 DIAGNOSIS — I2510 Atherosclerotic heart disease of native coronary artery without angina pectoris: Secondary | ICD-10-CM | POA: Insufficient documentation

## 2020-01-12 DIAGNOSIS — E119 Type 2 diabetes mellitus without complications: Secondary | ICD-10-CM | POA: Insufficient documentation

## 2020-01-12 DIAGNOSIS — C155 Malignant neoplasm of lower third of esophagus: Secondary | ICD-10-CM | POA: Diagnosis not present

## 2020-01-12 DIAGNOSIS — Z85828 Personal history of other malignant neoplasm of skin: Secondary | ICD-10-CM | POA: Diagnosis not present

## 2020-01-12 DIAGNOSIS — N400 Enlarged prostate without lower urinary tract symptoms: Secondary | ICD-10-CM | POA: Diagnosis not present

## 2020-01-12 DIAGNOSIS — Z96641 Presence of right artificial hip joint: Secondary | ICD-10-CM | POA: Diagnosis not present

## 2020-01-12 DIAGNOSIS — Z9049 Acquired absence of other specified parts of digestive tract: Secondary | ICD-10-CM | POA: Diagnosis not present

## 2020-01-12 HISTORY — PX: PORTACATH PLACEMENT: SHX2246

## 2020-01-12 LAB — GLUCOSE, CAPILLARY
Glucose-Capillary: 175 mg/dL — ABNORMAL HIGH (ref 70–99)
Glucose-Capillary: 155 mg/dL — ABNORMAL HIGH (ref 70–99)

## 2020-01-12 SURGERY — INSERTION, TUNNELED CENTRAL VENOUS DEVICE, WITH PORT
Anesthesia: General | Laterality: Left

## 2020-01-12 MED ORDER — FENTANYL CITRATE (PF) 100 MCG/2ML IJ SOLN
INTRAMUSCULAR | Status: AC
  Filled 2020-01-12: qty 2

## 2020-01-12 MED ORDER — OXYCODONE HCL 5 MG/5ML PO SOLN: 5.0000 mg | Freq: Once | ORAL | Status: DC | PRN

## 2020-01-12 MED ORDER — CEFAZOLIN SODIUM-DEXTROSE 2-4 GM/100ML-% IV SOLN
2.0000 g | INTRAVENOUS | Status: AC
  Administered 2020-01-12: 2 g via INTRAVENOUS

## 2020-01-12 MED ORDER — CEFAZOLIN SODIUM-DEXTROSE 2-4 GM/100ML-% IV SOLN
INTRAVENOUS | Status: AC
  Filled 2020-01-12: qty 100

## 2020-01-12 MED ORDER — CHLORHEXIDINE GLUCONATE CLOTH 2 % EX PADS
6.0000 | MEDICATED_PAD | Freq: Once | CUTANEOUS | Status: AC
  Administered 2020-01-12: 6 via TOPICAL

## 2020-01-12 MED ORDER — PROPOFOL 10 MG/ML IV BOLUS
INTRAVENOUS | Status: AC
  Filled 2020-01-12: qty 20

## 2020-01-12 MED ORDER — MIDAZOLAM HCL 2 MG/2ML IJ SOLN
INTRAMUSCULAR | Status: AC
  Filled 2020-01-12: qty 2

## 2020-01-12 MED ORDER — FENTANYL CITRATE (PF) 100 MCG/2ML IJ SOLN
INTRAMUSCULAR | Status: DC | PRN
  Administered 2020-01-12: 100 ug via INTRAVENOUS
  Administered 2020-01-12 (×2): 50 ug via INTRAVENOUS

## 2020-01-12 MED ORDER — ONDANSETRON HCL 4 MG/2ML IJ SOLN: 4.0000 mg | Freq: Once | INTRAMUSCULAR | Status: DC | PRN

## 2020-01-12 MED ORDER — ONDANSETRON HCL 4 MG/2ML IJ SOLN
INTRAMUSCULAR | Status: DC | PRN
  Administered 2020-01-12: 4 mg via INTRAVENOUS

## 2020-01-12 MED ORDER — SODIUM CHLORIDE 0.9 % IV SOLN: INTRAVENOUS | Status: DC

## 2020-01-12 MED ORDER — LACTATED RINGERS IV SOLN: INTRAVENOUS | Status: DC

## 2020-01-12 MED ORDER — DEXMEDETOMIDINE (PRECEDEX) IN NS 20 MCG/5ML (4 MCG/ML) IV SYRINGE
PREFILLED_SYRINGE | INTRAVENOUS | Status: DC | PRN
  Administered 2020-01-12: 4 ug via INTRAVENOUS
  Administered 2020-01-12: 8 ug via INTRAVENOUS
  Administered 2020-01-12 (×2): 4 ug via INTRAVENOUS

## 2020-01-12 MED ORDER — ORAL CARE MOUTH RINSE: 15.0000 mL | Freq: Once | OROMUCOSAL | Status: AC

## 2020-01-12 MED ORDER — METOPROLOL TARTRATE 5 MG/5ML IV SOLN
INTRAVENOUS | Status: DC | PRN
  Administered 2020-01-12 (×4): 5 mg via INTRAVENOUS

## 2020-01-12 MED ORDER — LIDOCAINE HCL (PF) 1 % IJ SOLN
INTRAMUSCULAR | Status: AC
  Filled 2020-01-12: qty 30

## 2020-01-12 MED ORDER — HEPARIN SODIUM (PORCINE) 5000 UNIT/ML IJ SOLN
INTRAMUSCULAR | Status: AC
  Filled 2020-01-12: qty 1

## 2020-01-12 MED ORDER — MIDAZOLAM HCL 2 MG/2ML IJ SOLN
INTRAMUSCULAR | Status: DC | PRN
  Administered 2020-01-12: 2 mg via INTRAVENOUS

## 2020-01-12 MED ORDER — FENTANYL CITRATE (PF) 100 MCG/2ML IJ SOLN: 25.0000 ug | INTRAMUSCULAR | Status: DC | PRN

## 2020-01-12 MED ORDER — LIDOCAINE HCL 1 % IJ SOLN
INTRAMUSCULAR | Status: DC | PRN
  Administered 2020-01-12: 20 mL
  Administered 2020-01-12: 3 mL

## 2020-01-12 MED ORDER — CHLORHEXIDINE GLUCONATE 0.12 % MT SOLN
15.0000 mL | Freq: Once | OROMUCOSAL | Status: AC
  Administered 2020-01-12: 15 mL via OROMUCOSAL

## 2020-01-12 MED ORDER — DEXMEDETOMIDINE (PRECEDEX) IN NS 20 MCG/5ML (4 MCG/ML) IV SYRINGE
PREFILLED_SYRINGE | INTRAVENOUS | Status: AC
  Filled 2020-01-12: qty 5

## 2020-01-12 MED ORDER — OXYCODONE HCL 5 MG PO TABS: 5.0000 mg | ORAL_TABLET | Freq: Once | ORAL | Status: DC | PRN

## 2020-01-12 MED ORDER — CHLORHEXIDINE GLUCONATE 0.12 % MT SOLN
OROMUCOSAL | Status: AC
  Filled 2020-01-12: qty 15

## 2020-01-12 SURGICAL SUPPLY — 43 items
BAG DECANTER FOR FLEXI CONT (MISCELLANEOUS) ×2 IMPLANT
BLADE SURG 15 STRL LF DISP TIS (BLADE) ×1 IMPLANT
BLADE SURG 15 STRL SS (BLADE) ×1
BLADE SURG SZ11 CARB STEEL (BLADE) ×2 IMPLANT
CANISTER SUCT 1200ML W/VALVE (MISCELLANEOUS) ×2 IMPLANT
CHLORAPREP W/TINT 26 (MISCELLANEOUS) ×2 IMPLANT
COVER LIGHT HANDLE STERIS (MISCELLANEOUS) ×4 IMPLANT
DRAPE C-ARM XRAY 36X54 (DRAPES) ×2 IMPLANT
DRSG TEGADERM 2-3/8X2-3/4 SM (GAUZE/BANDAGES/DRESSINGS) IMPLANT
DRSG TEGADERM 4X4.75 (GAUZE/BANDAGES/DRESSINGS) ×2 IMPLANT
DRSG TELFA 4X3 1S NADH ST (GAUZE/BANDAGES/DRESSINGS) ×2 IMPLANT
ELECT CAUTERY BLADE TIP 2.5 (TIP) ×2
ELECT REM PT RETURN 9FT ADLT (ELECTROSURGICAL) ×2
ELECTRODE CAUTERY BLDE TIP 2.5 (TIP) ×1 IMPLANT
ELECTRODE REM PT RTRN 9FT ADLT (ELECTROSURGICAL) ×1 IMPLANT
GLOVE SURG SYN 7.5  E (GLOVE) ×1
GLOVE SURG SYN 7.5 E (GLOVE) ×1 IMPLANT
GOWN STRL REUS W/ TWL LRG LVL3 (GOWN DISPOSABLE) ×2 IMPLANT
GOWN STRL REUS W/TWL LRG LVL3 (GOWN DISPOSABLE) ×2
IV NS 500ML (IV SOLUTION) ×1
IV NS 500ML BAXH (IV SOLUTION) ×1 IMPLANT
KIT PORT POWER 8FR ISP CVUE (Port) ×2 IMPLANT
KIT TURNOVER KIT A (KITS) ×2 IMPLANT
LABEL OR SOLS (LABEL) ×4 IMPLANT
MARKER SKIN DUAL TIP RULER LAB (MISCELLANEOUS) ×2 IMPLANT
NEEDLE FILTER BLUNT 18X 1/2SAF (NEEDLE) ×1
NEEDLE FILTER BLUNT 18X1 1/2 (NEEDLE) ×1 IMPLANT
NEEDLE HYPO 22GX1.5 SAFETY (NEEDLE) ×4 IMPLANT
NS IRRIG 500ML POUR BTL (IV SOLUTION) ×2 IMPLANT
PACK PORT-A-CATH (MISCELLANEOUS) ×2 IMPLANT
STRIP CLOSURE SKIN 1/4X4 (GAUZE/BANDAGES/DRESSINGS) IMPLANT
SUT ETHILON 4-0 (SUTURE) ×2
SUT ETHILON 4-0 FS2 18XMFL BLK (SUTURE) ×2
SUT PROLENE 2 0 SH DA (SUTURE) ×6 IMPLANT
SUT VIC AB 2-0 SH 27 (SUTURE) ×1
SUT VIC AB 2-0 SH 27XBRD (SUTURE) ×1 IMPLANT
SUT VIC AB 3-0 SH 27 (SUTURE) ×1
SUT VIC AB 3-0 SH 27X BRD (SUTURE) ×1 IMPLANT
SUTURE ETHLN 4-0 FS2 18XMF BLK (SUTURE) ×2 IMPLANT
SYR 10ML SLIP (SYRINGE) ×2 IMPLANT
SYR 3ML LL SCALE MARK (SYRINGE) ×2 IMPLANT
TAPE CLOTH 3X10 WHT NS LF (GAUZE/BANDAGES/DRESSINGS) IMPLANT
TAPE TRANSPORE STRL 2 31045 (GAUZE/BANDAGES/DRESSINGS) ×2 IMPLANT

## 2020-01-12 NOTE — Interval H&P Note (Signed)
History and Physical Interval Note:  01/12/2020 7:18 AM  Blake [name]  has presented today for surgery, with the diagnosis of Esophageal cancer.  The various methods of treatment have been discussed with the patient and family. After consideration of risks, benefits and other options for treatment, the patient has consented to  Procedure(s): INSERTION PORT-A-CATH, with ultrasound fluoroscopy (N/A) as a surgical intervention.  The patient's history has been reviewed, patient examined, no change in status, stable for surgery.  I have reviewed the patient's chart and labs.  Questions were answered to the patient's satisfaction.     Nestor Lewandowsky

## 2020-01-12 NOTE — Discharge Instructions (Signed)

## 2020-01-12 NOTE — Telephone Encounter (Signed)
Patient had a procedure this morning with Dr Genevive Bi port placed and he wants to know when he can start back taking his aspirin. Please call and advise

## 2020-01-12 NOTE — Op Note (Signed)
01/12/2020  9:09 AM  PATIENT:  Blake [name]  74 y.o. male  PRE-OPERATIVE DIAGNOSIS:  Esophageal cancer  POST-OPERATIVE DIAGNOSIS:  Esophageal cancer  PROCEDURE:  Procedure(s): INSERTION PORT-A-CATH, with ultrasound fluoroscopy (Left)  SURGEON:  Surgeon(s) and Role:    Nestor Lewandowsky, MD - Primary  ASSISTANTS:  Erenest Blank PA-S  ANESTHESIA: IV sedation with local     DICTATION:   The patient was brought to the operating suite and placed in the supine position.  The patient was then prepped and draped in usual sterile fashion. The left subclavian vein was percutaneously catheterized. A wire was placed into the venous system under fluoroscopic guidance. An appropriate site was selected on the chest wall and a Port-A-Cath pocket was created. The catheter was tunneled from the port site up to the insertion site. The catheter was then inserted through a peel-away sheath and positioned at the appropriate level in the superior vena cava. The catheter was then assembled and aspirated and flushed easily. It was then secured to the anterior chest wall with interrupted Prolene sutures. The catheter was flushed one last time and the wounds were then closed. The subcutaneous tissues were closed with running absorbable sutures and the skin with nylon. Sterile dressings were applied. Patient was then transported to the recovery room in stable condition.   Nestor Lewandowsky, MD

## 2020-01-12 NOTE — Transfer of Care (Signed)
Immediate Anesthesia Transfer of Care Note  Patient: Blake [name]  Procedure(s) Performed: INSERTION PORT-A-CATH, with ultrasound fluoroscopy (Left )  Patient Location: PACU  Anesthesia Type:General  Level of Consciousness: drowsy and patient cooperative  Airway & Oxygen Therapy: Patient Spontanous Breathing and Patient connected to nasal cannula oxygen  Post-op Assessment: Report given to RN and Post -op Vital signs reviewed and stable  Post vital signs: Reviewed and stable  Last Vitals:  Vitals Value Taken Time  BP 109/63 01/12/20 0902  Temp 36.4 C 01/12/20 0902  Pulse 76 01/12/20 0903  Resp 15 01/12/20 0903  SpO2 94 % 01/12/20 0903  Vitals shown include unvalidated device data.  Last Pain:  Vitals:   01/12/20 0608  TempSrc: Oral  PainSc: 8          Complications: No complications documented.

## 2020-01-12 NOTE — Anesthesia Preprocedure Evaluation (Addendum)
Anesthesia Evaluation  Patient identified by MRN, date of birth, ID band Patient awake    Reviewed: Allergy & Precautions, H&P , NPO status , Patient's Chart, lab work & pertinent test results  History of Anesthesia Complications Negative for: history of anesthetic complications  Airway Mallampati: II  TM Distance: >3 FB     Dental  (+) Teeth Intact   Pulmonary neg sleep apnea, neg COPD, former smoker,    breath sounds clear to auscultation       Cardiovascular hypertension, (-) angina+ CAD and + Cardiac Stents  (-) Past MI + dysrhythmias (tachycardia (?type), controlled with metoprolol)  Rhythm:regular Rate:Normal     Neuro/Psych negative neurological ROS  negative psych ROS   GI/Hepatic Neg liver ROS, GERD  Medicated,Esophageal cancer s/p esophagectomy June 2021   Endo/Other  diabetes  Renal/GU negative Renal ROS  negative genitourinary   Musculoskeletal   Abdominal   Peds  Hematology negative hematology ROS (+)   Anesthesia Other Findings Past Medical History: No date: Arthritis No date: Back pain     Comment:  with leg pain 2021: Cancer ([place])     Comment:  esophageal 2006: Cancer ([place])     Comment:  base of toungue No date: 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,  bilateral No date: Coronary artery disease No date: Diabetes mellitus without complication (HCC) No date: Dyspnea No date: Dysrhythmia No date: GERD (gastroesophageal reflux disease) No date: Hyperlipidemia No date: Hypertension No date: Numbness and tingling of both lower extremities     Comment:  with positioning 2006: Port-A-Cath in place No date: Prostate enlargement No date: Skin cancer     Comment:  several sites/ face cheek  Past Surgical History: No date: ABDOMINAL SURGERY 12/08/2003: COLONOSCOPY 03/21/2019: COLONOSCOPY WITH PROPOFOL; N/A     Comment:  Procedure: COLONOSCOPY WITH PROPOFOL;  Surgeon: Toledo,                Benay Pike, MD;  Location: [facility] ENDOSCOPY;  Service:               Gastroenterology;  Laterality: N/A; 03/21/2019: ESOPHAGOGASTRODUODENOSCOPY (EGD) WITH PROPOFOL; N/A     Comment:  Procedure: ESOPHAGOGASTRODUODENOSCOPY (EGD) WITH               PROPOFOL;  Surgeon: Toledo, Benay Pike, MD;  Location:               [facility] ENDOSCOPY;  Service: Gastroenterology;  Laterality:               N/A; No date: esophogeal cancer No date: JOINT REPLACEMENT; Right     Comment:  total hip 03/03/2018: LUMBAR LAMINECTOMY/DECOMPRESSION MICRODISCECTOMY; N/A     Comment:  Procedure: LUMBAR LAMINECTOMY/DECOMPRESSION               MICRODISCECTOMY 1 LEVEL-L3-4,L4-5;  Surgeon: Meade Maw, MD;  Location: [facility] ORS;  Service: Neurosurgery;              Laterality: N/A; 2014: PARTIAL HIP ARTHROPLASTY; Right No date: PORT-A-CATH REMOVAL 2006: TONGUE BIOPSY No date: TONSILLECTOMY 2013: [place]; Right 01/25/14: UPPER GI ENDOSCOPY     Comment:  multiple gastric polyps     Reproductive/Obstetrics negative OB ROS                           Anesthesia Physical Anesthesia Plan  ASA: III  Anesthesia Plan:  MAC   Post-op Pain Management:    Induction:   PONV Risk Score and Plan:   Airway Management Planned:   Additional Equipment:   Intra-op Plan:   Post-operative Plan:   Informed Consent: I have reviewed the patients History and Physical, chart, labs and discussed the procedure including the risks, benefits and alternatives for the proposed anesthesia with the patient or authorized representative who has indicated his/her understanding and acceptance.     Dental Advisory Given  Plan Discussed with: Anesthesiologist, CRNA and Surgeon  Anesthesia Plan Comments:        Anesthesia Quick Evaluation

## 2020-01-12 NOTE — Telephone Encounter (Signed)
Spoke with Dr.Oaks -patient can resume Aspirin tomorrow 01/13/20.

## 2020-01-14 NOTE — Anesthesia Postprocedure Evaluation (Signed)
Anesthesia Post Note  Patient: Blake [name]  Procedure(s) Performed: INSERTION PORT-A-CATH, with ultrasound fluoroscopy (Left )  Patient location during evaluation: PACU Anesthesia Type: General Level of consciousness: awake and alert Pain management: pain level controlled Vital Signs Assessment: post-procedure vital signs reviewed and stable Respiratory status: spontaneous breathing, nonlabored ventilation, respiratory function stable and patient connected to nasal cannula oxygen Cardiovascular status: stable and blood pressure returned to baseline Postop Assessment: no apparent nausea or vomiting Anesthetic complications: no   No complications documented.   Last Vitals:  Vitals:   01/12/20 0955 01/12/20 1024  BP:  (!) 105/55  Pulse: 78 72  Resp: 10 14  Temp:  (!) 36.1 C  SpO2: 94% 96%    Last Pain:  Vitals:   01/12/20 1013  TempSrc:   PainSc: 0-No pain                 Brett Canales Ben Habermann

## 2020-01-17 ENCOUNTER — Telehealth: Payer: Self-pay | Admitting: *Deleted

## 2020-01-17 ENCOUNTER — Other Ambulatory Visit: Payer: Self-pay

## 2020-01-17 ENCOUNTER — Ambulatory Visit
Admission: RE | Admit: 2020-01-17 | Discharge: 2020-01-17 | Disposition: A | Discharge status: Home / Self Care | Payer: Medicare Other | Source: Ambulatory Visit | Attending: Oncology | Admitting: Oncology

## 2020-01-17 ENCOUNTER — Inpatient Hospital Stay (HOSPITAL_BASED_OUTPATIENT_CLINIC_OR_DEPARTMENT_OTHER): Payer: Medicare Other | Admitting: Oncology

## 2020-01-17 ENCOUNTER — Encounter: Payer: Self-pay | Admitting: Oncology

## 2020-01-17 ENCOUNTER — Inpatient Hospital Stay: Payer: Medicare Other

## 2020-01-17 ENCOUNTER — Inpatient Hospital Stay: Payer: Medicare Other | Attending: Oncology

## 2020-01-17 ENCOUNTER — Inpatient Hospital Stay
Admission: EM | Admit: 2020-01-17 | Discharge: 2020-01-19 | DRG: 178 | Disposition: A | Discharge status: Home / Self Care | Payer: Medicare Other | Attending: Internal Medicine | Admitting: Internal Medicine

## 2020-01-17 VITALS — BP 153/55 | HR 96 | Temp 99.5°F | Resp 18 | Wt 156.3 lb

## 2020-01-17 DIAGNOSIS — R9389 Abnormal findings on diagnostic imaging of other specified body structures: Secondary | ICD-10-CM | POA: Insufficient documentation

## 2020-01-17 DIAGNOSIS — E119 Type 2 diabetes mellitus without complications: Secondary | ICD-10-CM | POA: Insufficient documentation

## 2020-01-17 DIAGNOSIS — R1310 Dysphagia, unspecified: Secondary | ICD-10-CM | POA: Diagnosis present

## 2020-01-17 DIAGNOSIS — Z87891 Personal history of nicotine dependence: Secondary | ICD-10-CM | POA: Diagnosis not present

## 2020-01-17 DIAGNOSIS — C159 Malignant neoplasm of esophagus, unspecified: Secondary | ICD-10-CM

## 2020-01-17 DIAGNOSIS — M1990 Unspecified osteoarthritis, unspecified site: Secondary | ICD-10-CM | POA: Diagnosis present

## 2020-01-17 DIAGNOSIS — Z8249 Family history of ischemic heart disease and other diseases of the circulatory system: Secondary | ICD-10-CM | POA: Insufficient documentation

## 2020-01-17 DIAGNOSIS — Z85810 Personal history of malignant neoplasm of tongue: Secondary | ICD-10-CM | POA: Diagnosis not present

## 2020-01-17 DIAGNOSIS — Z79899 Other long term (current) drug therapy: Secondary | ICD-10-CM | POA: Diagnosis not present

## 2020-01-17 DIAGNOSIS — Z882 Allergy status to sulfonamides status: Secondary | ICD-10-CM

## 2020-01-17 DIAGNOSIS — M549 Dorsalgia, unspecified: Secondary | ICD-10-CM | POA: Diagnosis present

## 2020-01-17 DIAGNOSIS — Z801 Family history of malignant neoplasm of trachea, bronchus and lung: Secondary | ICD-10-CM | POA: Insufficient documentation

## 2020-01-17 DIAGNOSIS — I2510 Atherosclerotic heart disease of native coronary artery without angina pectoris: Secondary | ICD-10-CM | POA: Insufficient documentation

## 2020-01-17 DIAGNOSIS — C155 Malignant neoplasm of lower third of esophagus: Secondary | ICD-10-CM | POA: Insufficient documentation

## 2020-01-17 DIAGNOSIS — N400 Enlarged prostate without lower urinary tract symptoms: Secondary | ICD-10-CM | POA: Diagnosis present

## 2020-01-17 DIAGNOSIS — R0602 Shortness of breath: Secondary | ICD-10-CM | POA: Diagnosis not present

## 2020-01-17 DIAGNOSIS — J189 Pneumonia, unspecified organism: Secondary | ICD-10-CM | POA: Diagnosis present

## 2020-01-17 DIAGNOSIS — I6529 Occlusion and stenosis of unspecified carotid artery: Secondary | ICD-10-CM | POA: Diagnosis present

## 2020-01-17 DIAGNOSIS — G8929 Other chronic pain: Secondary | ICD-10-CM | POA: Diagnosis present

## 2020-01-17 DIAGNOSIS — R918 Other nonspecific abnormal finding of lung field: Secondary | ICD-10-CM | POA: Insufficient documentation

## 2020-01-17 DIAGNOSIS — Z9221 Personal history of antineoplastic chemotherapy: Secondary | ICD-10-CM | POA: Insufficient documentation

## 2020-01-17 DIAGNOSIS — J690 Pneumonitis due to inhalation of food and vomit: Secondary | ICD-10-CM

## 2020-01-17 DIAGNOSIS — K219 Gastro-esophageal reflux disease without esophagitis: Secondary | ICD-10-CM | POA: Insufficient documentation

## 2020-01-17 DIAGNOSIS — Z85828 Personal history of other malignant neoplasm of skin: Secondary | ICD-10-CM | POA: Insufficient documentation

## 2020-01-17 DIAGNOSIS — Z7984 Long term (current) use of oral hypoglycemic drugs: Secondary | ICD-10-CM

## 2020-01-17 DIAGNOSIS — I10 Essential (primary) hypertension: Secondary | ICD-10-CM | POA: Insufficient documentation

## 2020-01-17 DIAGNOSIS — Z20822 Contact with and (suspected) exposure to covid-19: Secondary | ICD-10-CM | POA: Diagnosis present

## 2020-01-17 DIAGNOSIS — Z803 Family history of malignant neoplasm of breast: Secondary | ICD-10-CM | POA: Insufficient documentation

## 2020-01-17 DIAGNOSIS — Z833 Family history of diabetes mellitus: Secondary | ICD-10-CM

## 2020-01-17 DIAGNOSIS — E785 Hyperlipidemia, unspecified: Secondary | ICD-10-CM | POA: Diagnosis present

## 2020-01-17 DIAGNOSIS — Z923 Personal history of irradiation: Secondary | ICD-10-CM | POA: Insufficient documentation

## 2020-01-17 DIAGNOSIS — Z5111 Encounter for antineoplastic chemotherapy: Secondary | ICD-10-CM | POA: Diagnosis not present

## 2020-01-17 DIAGNOSIS — E1169 Type 2 diabetes mellitus with other specified complication: Secondary | ICD-10-CM | POA: Diagnosis not present

## 2020-01-17 LAB — RESPIRATORY PANEL BY RT PCR (FLU A&B, COVID)
Influenza A by PCR: NEGATIVE
Influenza B by PCR: NEGATIVE
SARS Coronavirus 2 by RT PCR: NEGATIVE

## 2020-01-17 LAB — CBC WITH DIFFERENTIAL/PLATELET
Abs Immature Granulocytes: 0.06 10*3/uL (ref 0.00–0.07)
Basophils Absolute: 0.1 10*3/uL (ref 0.0–0.1)
Basophils Relative: 0 %
Eosinophils Absolute: 0.1 10*3/uL (ref 0.0–0.5)
Eosinophils Relative: 0 %
HCT: 40.5 % (ref 39.0–52.0)
Hemoglobin: 12.9 g/dL — ABNORMAL LOW (ref 13.0–17.0)
Immature Granulocytes: 0 %
Lymphocytes Relative: 4 %
Lymphs Abs: 0.6 10*3/uL — ABNORMAL LOW (ref 0.7–4.0)
MCH: 26.4 pg (ref 26.0–34.0)
MCHC: 31.9 g/dL (ref 30.0–36.0)
MCV: 82.8 fL (ref 80.0–100.0)
Monocytes Absolute: 1.1 10*3/uL — ABNORMAL HIGH (ref 0.1–1.0)
Monocytes Relative: 8 %
Neutro Abs: 11.6 10*3/uL — ABNORMAL HIGH (ref 1.7–7.7)
Neutrophils Relative %: 88 %
Platelets: 340 10*3/uL (ref 150–400)
RBC: 4.89 MIL/uL (ref 4.22–5.81)
RDW: 14.9 % (ref 11.5–15.5)
WBC: 13.4 10*3/uL — ABNORMAL HIGH (ref 4.0–10.5)
nRBC: 0 % (ref 0.0–0.2)

## 2020-01-17 LAB — COMPREHENSIVE METABOLIC PANEL
ALT: 24 U/L (ref 0–44)
AST: 23 U/L (ref 15–41)
Albumin: 2.9 g/dL — ABNORMAL LOW (ref 3.5–5.0)
Alkaline Phosphatase: 72 U/L (ref 38–126)
Anion gap: 11 (ref 5–15)
BUN: 20 mg/dL (ref 8–23)
CO2: 24 mmol/L (ref 22–32)
Calcium: 8.6 mg/dL — ABNORMAL LOW (ref 8.9–10.3)
Chloride: 100 mmol/L (ref 98–111)
Creatinine, Ser: 0.79 mg/dL (ref 0.61–1.24)
GFR, Estimated: >60 mL/min (ref >60)
Glucose, Bld: 175 mg/dL — ABNORMAL HIGH (ref 70–99)
Potassium: 4.4 mmol/L (ref 3.5–5.1)
Sodium: 135 mmol/L (ref 135–145)
Total Bilirubin: 0.5 mg/dL (ref 0.3–1.2)
Total Protein: 7.1 g/dL (ref 6.5–8.1)

## 2020-01-17 MED ORDER — AZITHROMYCIN 500 MG PO TABS
500.0000 mg | ORAL_TABLET | Freq: Every day | ORAL | Status: AC
  Administered 2020-01-17: 500 mg via ORAL
  Filled 2020-01-17: qty 1

## 2020-01-17 MED ORDER — TAMSULOSIN HCL 0.4 MG PO CAPS
0.4000 mg | ORAL_CAPSULE | Freq: Every day | ORAL | Status: DC
  Administered 2020-01-17 – 2020-01-18 (×2): 0.4 mg via ORAL
  Filled 2020-01-17 (×2): qty 1

## 2020-01-17 MED ORDER — ACETAMINOPHEN 325 MG PO TABS: 650.0000 mg | ORAL_TABLET | Freq: Four times a day (QID) | ORAL | Status: DC | PRN

## 2020-01-17 MED ORDER — AZITHROMYCIN 250 MG PO TABS
250.0000 mg | ORAL_TABLET | Freq: Every day | ORAL | Status: DC
  Administered 2020-01-18: 250 mg via ORAL
  Filled 2020-01-17: qty 1

## 2020-01-17 MED ORDER — PANTOPRAZOLE SODIUM 40 MG PO TBEC
40.0000 mg | DELAYED_RELEASE_TABLET | Freq: Two times a day (BID) | ORAL | Status: DC
  Administered 2020-01-18 – 2020-01-19 (×4): 40 mg via ORAL
  Filled 2020-01-17 (×4): qty 1

## 2020-01-17 MED ORDER — PROMETHAZINE HCL 25 MG PO TABS
25.0000 mg | ORAL_TABLET | Freq: Four times a day (QID) | ORAL | Status: DC | PRN
  Administered 2020-01-18: 25 mg via ORAL
  Filled 2020-01-17 (×2): qty 1

## 2020-01-17 MED ORDER — HEPARIN SOD (PORK) LOCK FLUSH 100 UNIT/ML IV SOLN
500.0000 [IU] | Freq: Once | INTRAVENOUS | Status: AC
  Administered 2020-01-17: 500 [IU] via INTRAVENOUS
  Filled 2020-01-17: qty 5

## 2020-01-17 MED ORDER — METOPROLOL SUCCINATE ER 50 MG PO TB24
50.0000 mg | ORAL_TABLET | Freq: Every day | ORAL | Status: DC
  Administered 2020-01-17 – 2020-01-19 (×3): 50 mg via ORAL
  Filled 2020-01-17 (×3): qty 1

## 2020-01-17 MED ORDER — AMIODARONE HCL 200 MG PO TABS: 200.0000 mg | ORAL_TABLET | Freq: Every day | ORAL | Status: DC

## 2020-01-17 MED ORDER — METOPROLOL SUCCINATE ER 50 MG PO TB24: 50.0000 mg | ORAL_TABLET | Freq: Every day | ORAL | Status: DC

## 2020-01-17 MED ORDER — ENOXAPARIN SODIUM 40 MG/0.4ML ~~LOC~~ SOLN
40.0000 mg | SUBCUTANEOUS | Status: DC
  Administered 2020-01-18: 40 mg via SUBCUTANEOUS
  Filled 2020-01-17 (×2): qty 0.4

## 2020-01-17 MED ORDER — IOHEXOL 300 MG/ML  SOLN
75.0000 mL | Freq: Once | INTRAMUSCULAR | Status: AC | PRN
  Administered 2020-01-17: 75 mL via INTRAVENOUS

## 2020-01-17 MED ORDER — AMIODARONE HCL 200 MG PO TABS
200.0000 mg | ORAL_TABLET | Freq: Every day | ORAL | Status: DC
  Administered 2020-01-17 – 2020-01-19 (×3): 200 mg via ORAL
  Filled 2020-01-17 (×3): qty 1

## 2020-01-17 MED ORDER — ACETAMINOPHEN 650 MG RE SUPP: 650.0000 mg | Freq: Four times a day (QID) | RECTAL | Status: DC | PRN

## 2020-01-17 MED ORDER — ONDANSETRON HCL 4 MG PO TABS: 4.0000 mg | ORAL_TABLET | Freq: Four times a day (QID) | ORAL | Status: DC | PRN

## 2020-01-17 MED ORDER — SODIUM CHLORIDE 0.9 % IV SOLN
1.0000 g | Freq: Three times a day (TID) | INTRAVENOUS | Status: DC
  Administered 2020-01-17: 1 g via INTRAVENOUS
  Filled 2020-01-17: qty 1

## 2020-01-17 MED ORDER — ONDANSETRON HCL 4 MG/2ML IJ SOLN
4.0000 mg | Freq: Four times a day (QID) | INTRAMUSCULAR | Status: DC | PRN
  Administered 2020-01-18: 4 mg via INTRAVENOUS
  Filled 2020-01-17: qty 2

## 2020-01-17 MED ORDER — SODIUM CHLORIDE 0.9 % IV BOLUS (SEPSIS)
1000.0000 mL | Freq: Once | INTRAVENOUS | Status: AC
  Administered 2020-01-17: 1000 mL via INTRAVENOUS

## 2020-01-17 MED ORDER — SODIUM CHLORIDE 0.9 % IV SOLN
3.0000 g | Freq: Four times a day (QID) | INTRAVENOUS | Status: DC
  Administered 2020-01-18 – 2020-01-19 (×6): 3 g via INTRAVENOUS
  Filled 2020-01-17: qty 3
  Filled 2020-01-17: qty 8
  Filled 2020-01-17: qty 3
  Filled 2020-01-17 (×6): qty 8

## 2020-01-17 NOTE — ED Triage Notes (Signed)
Pt was sent from doctor at the cancer center for pneumonia. States he had a port a cath put in on 10/14 and states when he went today for chemotherapy that said his chest xray looked bad and sent him for CT chest . Pt states he has been feeling SOB since the procedure last week,. States he is currently doing chemotherapy for esophageal cancer.

## 2020-01-17 NOTE — Telephone Encounter (Signed)
Called Report  IMPRESSION: 1. Extensive nodular airspace process in the right lung with loss of volume and areas of subpleural atelectasis. Polylobar pneumonia with aspiration a consideration. 2. Surgical changes related to a gastric pull-through procedure. No complicating features are identified. 3. No mediastinal or hilar mass or adenopathy. 4. Stable atherosclerotic calcifications involving the aorta and coronary arteries. 5. Aortic atherosclerosis.  These results will be called to the ordering clinician or representative by the Radiologist Assistant, and communication documented in the PACS or [employer].  Aortic Atherosclerosis (ICD10-I70.0).   Electronically Signed   By: Marijo Sanes M.D.   On: 01/17/2020 11:38

## 2020-01-17 NOTE — Progress Notes (Signed)
Pharmacy Antibiotic Note  EGON DITTUS is a 74 y.o. male admitted on 01/17/2020 with aspiration pneumonia.  Pharmacy has been consulted for Unasyn dosing.  Plan: Unasyn 3g IV q6h  Height: 5\' 5"  (165.1 cm) Weight: 72.1 kg (159 lb) IBW/kg (Calculated) : 61.5  Temp (24hrs), Avg:98.4 F (36.9 C), Min:97.8 F (36.6 C), Max:99.5 F (37.5 C)  Recent Labs  Lab 01/17/20 0840  WBC 13.4*  CREATININE 0.79    Estimated Creatinine Clearance: 71.5 mL/min (by C-G formula based on SCr of 0.79 mg/dL).    Allergies  Allergen Reactions  . Sulfa Antibiotics Rash    Antimicrobials this admission: Meropenem 10/19 x 1 in ED Azithromycin 10/19 >>  Unasyn 10/20 >>   Dose adjustments this admission:  Microbiology results:  Thank you for allowing pharmacy to be a part of this patient's care.  Paulina Fusi, PharmD, BCPS 01/17/2020 5:56 PM

## 2020-01-17 NOTE — ED Notes (Signed)
Assumed care of pt at 1900. Wife at bedside. Pt denies sob or cp at this time. Reports sob with exertion but none at rest. AO x4. Call bell within reach. Side rails up x1, pt watching tv comfortably in bed.

## 2020-01-17 NOTE — ED Provider Notes (Signed)
[facility] Emergency Department Provider Note  ____________________________________________  Time seen: Approximately 4:06 PM  I have reviewed the triage vital signs and the nursing notes.   HISTORY  Chief Complaint Shortness of Breath    HPI Blake [name] is a 74 y.o. 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  Patient reportedly has had abnormal chest x-ray since at least October 14.  He has had shortness of breath for the past week, constant, worse with ambulation, no alleviating factors.  Denies chest pain.  No fevers but positive chills.  No body aches vomiting or diarrhea.  He had Port-A-Cath placement on October 14.  Follow-up x-ray reviewed today by his oncologist showed persistent airspace infiltrate.  They obtained a CT scan of the chest which I reviewed which does show multifocal infiltrates in the right lung primarily.  This is concerning for multi lobar pneumonia.  Patient does state that he sometimes has difficulty swallowing and feels that he chokes on his food sometimes, raising concern for aspiration pneumonia.  Patient also notes that 5 or 6 weeks ago he had a spinal steroid injection due to chronic back pain.  Last chemotherapy was about 4 weeks ago.     Past Medical History:  Diagnosis Date  . Arthritis   . Back pain    with leg pain  . Cancer ([place]) 2021   esophageal  . Cancer ([place]) 2006   base of toungue  . 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 Coronary artery disease   . Diabetes mellitus without complication ([place])   . Dyspnea   . Dysrhythmia   . GERD (gastroesophageal reflux disease)   . Hyperlipidemia   . Hypertension   . Numbness and tingling of both lower extremities    with positioning  . Port-A-Cath in place 2006  . Prostate enlargement    . Skin cancer    several sites/ face cheek     Patient Active Problem List   Diagnosis Date Noted  . Non-intractable vomiting 12/26/2019  . 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 History of head and neck cancer 12/09/2019  . Chronic pain syndrome 12/08/2019  . History of lumbar laminectomy 12/08/2019  . Acute postoperative pain 09/13/2019  . Malignant neoplasm of lower third of esophagus ([place]) 07/14/2019  . Goals of care, counseling/discussion 05/04/2019  . Osteoarthritis 04/26/2019  . 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 Bilateral hand numbness 12/16/2018  . Bilateral hand pain 12/16/2018  . Lumbar radiculopathy 06/02/2017  . Lumbar degenerative disc disease 06/02/2017  . Spinal stenosis, lumbar region, 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  . Primary osteoarthritis of right shoulder 07/01/2016  . Bilateral carotid artery stenosis 06/05/2016  . Carotid stenosis 04/22/2016  . Allergic rhinitis 08/24/2015  . Diabetes mellitus ([place]) 08/24/2015  . 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 HLD (hyperlipidemia) 08/24/2015  . 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 Stenosing tenosynovitis of finger 07/11/2014  . Coronary artery disease 06/26/2014  . Family history of cancer of digestive system 10/20/2006  . 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 Acid reflux 04/01/2003  . Essential (primary) hypertension 04/01/2003     Past Surgical History:  Procedure Laterality Date  . ABDOMINAL SURGERY    . COLONOSCOPY  12/08/2003  . COLONOSCOPY WITH PROPOFOL N/A 03/21/2019   Procedure: COLONOSCOPY WITH PROPOFOL;  Surgeon:  Toledo, Benay Pike, MD;  Location: [facility] ENDOSCOPY;  Service: Gastroenterology;  Laterality: N/A;  . ESOPHAGOGASTRODUODENOSCOPY (EGD) WITH PROPOFOL N/A 03/21/2019   Procedure: ESOPHAGOGASTRODUODENOSCOPY (EGD) WITH PROPOFOL;  Surgeon: Toledo, Benay Pike, MD;  Location: [facility] ENDOSCOPY;  Service: Gastroenterology;  Laterality: N/A;  . esophogeal  cancer    . JOINT REPLACEMENT Right    total hip  . LUMBAR LAMINECTOMY/DECOMPRESSION MICRODISCECTOMY N/A 03/03/2018   Procedure: LUMBAR LAMINECTOMY/DECOMPRESSION MICRODISCECTOMY 1 LEVEL-L3-4,L4-5;  Surgeon: Meade Maw, MD;  Location: [facility] ORS;  Service: Neurosurgery;  Laterality: N/A;  . PARTIAL HIP ARTHROPLASTY Right 2014  . PORT-A-CATH REMOVAL    . PORTACATH PLACEMENT Left 01/12/2020   Procedure: INSERTION PORT-A-CATH, with ultrasound fluoroscopy;  Surgeon: Nestor Lewandowsky, MD;  Location: [facility] ORS;  Service: General;  Laterality: Left;  . TONGUE BIOPSY  2006  . TONSILLECTOMY    . TRIGGER FINGER RELEASE Right 2013  . UPPER GI ENDOSCOPY  01/25/14   multiple gastric polyps     Prior to Admission medications   Medication Sig Start Date End Date Taking? Authorizing Provider  amiodarone (PACERONE) 200 MG tablet Take 200 mg by mouth daily. 11/21/19   [provider]  Blood Glucose Monitoring Suppl (ONE TOUCH ULTRA SYSTEM KIT) w/Device KIT  07/06/07   [provider]  lidocaine-prilocaine (EMLA) cream Apply to affected area once 12/30/19   Earlie Server, MD  metFORMIN (GLUCOPHAGE) 500 MG tablet Take 500 mg by mouth 2 (two) times daily.  12/19/19   [provider]  metoprolol succinate (TOPROL-XL) 50 MG 24 hr tablet Take 50 mg by mouth daily.  09/20/13   [provider]  Misc. Devices (CHEMO TRANSFER PIN) MISC by Does not apply route.    [provider]  omeprazole (PRILOSEC) 40 MG capsule Take 40 mg by mouth in the morning and at bedtime.  09/15/16   [provider]  oxyCODONE (OXY IR/ROXICODONE) 5 MG immediate release tablet Take 1 tablet (5 mg total) by mouth every 4 (four) hours as needed for moderate pain ((score 4 to 6)). Patient not taking: Reported on 01/06/2020 03/04/18   Marin Olp, PA-C  promethazine (PHENERGAN) 25 MG tablet Take 1 tablet (25 mg total) by mouth every 6 (six) hours as needed for nausea or vomiting. 12/26/19   Earlie Server, MD   tamsulosin (FLOMAX) 0.4 MG CAPS capsule Take 0.4 mg by mouth daily after supper.  09/01/16   [provider]  prochlorperazine (COMPAZINE) 10 MG tablet Take 1 tablet (10 mg total) by mouth every 6 (six) hours as needed (Nausea or vomiting). 12/09/19 12/30/19  Earlie Server, MD     Allergies Sulfa antibiotics   Family History  Problem Relation Age of Onset  . Diabetes Mother   . Congestive Heart Failure Mother   . Breast cancer Mother   . Lung cancer Father     Social History Social History   Tobacco Use  . Smoking status: Former Smoker    Packs/day: 1.00    Years: 30.00    Pack years: 30.00    Types: Cigarettes    Quit date: 02/18/1994    Years since quitting: 25.9  . Smokeless tobacco: Never Used  Vaping Use  . Vaping Use: Never used  Substance Use Topics  . Alcohol use: Not Currently  . Drug use: No    Review of Systems  Constitutional:   No fever positive chills.  ENT:   No sore throat. No rhinorrhea. Cardiovascular:   No chest pain or syncope. Respiratory:  Positive shortness of breath and occasional cough. Gastrointestinal:   Negative for abdominal pain, vomiting and diarrhea.  Musculoskeletal:   Negative for focal pain or swelling All other systems reviewed and are negative except as documented above in ROS and HPI.  ____________________________________________   PHYSICAL EXAM:  VITAL SIGNS: ED Triage Vitals  Enc Vitals Group     BP 01/17/20 1425 (!) 119/48     Pulse Rate 01/17/20 1425 (!) 102     Resp 01/17/20 1425 18     Temp 01/17/20 1425 97.8 F (36.6 C)     Temp Source 01/17/20 1425 Oral     SpO2 01/17/20 1425 94 %     Weight 01/17/20 1426 159 lb (72.1 kg)     Height 01/17/20 1426 '5\' 5"'  (1.651 m)     Head Circumference --      Peak Flow --      Pain Score 01/17/20 1425 0     Pain Loc --      Pain Edu? --      Excl. in [place]? --     Vital signs reviewed, nursing assessments reviewed.   Constitutional:   Alert and oriented. Non-toxic  appearance. Eyes:   Conjunctivae are normal. EOMI. PERRL. ENT      Head:   Normocephalic and atraumatic.      Nose:   Wearing a mask.      Mouth/Throat:   Wearing a mask.      Neck:   No meningismus. Full ROM. Hematological/Lymphatic/Immunilogical:   No cervical lymphadenopathy. Cardiovascular:   RRR. Symmetric bilateral radial and DP pulses.  No murmurs. Cap refill less than 2 seconds. Respiratory:   Normal respiratory effort without tachypnea/retractions.  Rhonchorous breath sounds in the right lower lung.  No wheezing Gastrointestinal:   Soft and nontender. Non distended. There is no CVA tenderness.  No rebound, rigidity, or guarding.  Musculoskeletal:   Normal range of motion in all extremities. No joint effusions.  No lower extremity tenderness.  No edema. Neurologic:   Normal speech and language.  Motor grossly intact. No acute focal neurologic deficits are appreciated.  Skin:    Skin is warm, dry and intact. No rash noted.  No petechiae, purpura, or bullae.  ____________________________________________    LABS (pertinent positives/negatives) (all labs ordered are listed, but only abnormal results are displayed) Labs Reviewed  CULTURE, BLOOD (ROUTINE X 2)  CULTURE, BLOOD (ROUTINE X 2)  RESPIRATORY PANEL BY RT PCR (FLU A&B, COVID)   ____________________________________________   EKG    ____________________________________________    RADIOLOGY  CT Chest W Contrast  Result Date: 01/17/2020 CLINICAL DATA:  Follow-up abnormal chest x-ray. Patient with history of esophageal cancer. Status post gastric pull-through procedure and Port-A-Cath placement. EXAM: CT CHEST WITH CONTRAST TECHNIQUE: Multidetector CT imaging of the chest was performed during intravenous contrast administration. CONTRAST:  14m OMNIPAQUE IOHEXOL 300 MG/ML  SOLN COMPARISON:  Chest CT 04/06/2019 FINDINGS: Cardiovascular: The heart is normal in size. No pericardial effusion. Mild tortuosity and  calcification of the thoracic aorta is stable. No dissection. The branch vessels are patent. Stable scattered coronary artery calcifications. Mediastinum/Nodes: Small scattered mediastinal and hilar lymph nodes but no mass or overt adenopathy. These are likely reactive and related to the right lung process. Surgical changes related to a gastric pull-through procedure. No complicating features are identified. Lungs/Pleura: Extensive nodular airspace process in the right lung with loss of volume and areas of subpleural atelectasis. This involves the right upper lobe, right middle  lobe and right lower lobe. Aspiration would be a consideration. The left lung is clear. No infiltrates, edema or pulmonary nodules. No left-sided pleural effusion. Upper Abdomen: No significant upper abdominal findings. Musculoskeletal: No chest wall mass, supraclavicular or axillary adenopathy. The thyroid gland is unremarkable. There is a left-sided Port-A-Cath noted in good position without complicating features. No significant bony findings. IMPRESSION: 1. Extensive nodular airspace process in the right lung with loss of volume and areas of subpleural atelectasis. Polylobar pneumonia with aspiration a consideration. 2. Surgical changes related to a gastric pull-through procedure. No complicating features are identified. 3. No mediastinal or hilar mass or adenopathy. 4. Stable atherosclerotic calcifications involving the aorta and coronary arteries. 5. Aortic atherosclerosis. These results will be called to the ordering clinician or representative by the Radiologist Assistant, and communication documented in the PACS or [employer]. Aortic Atherosclerosis (ICD10-I70.0). Electronically Signed   By: Marijo Sanes M.D.   On: 01/17/2020 11:38    ____________________________________________   PROCEDURES Procedures  ____________________________________________    CLINICAL IMPRESSION / ASSESSMENT AND PLAN / ED  COURSE  Medications ordered in the ED: Medications  sodium chloride 0.9 % bolus 1,000 mL (has no administration in time range)  meropenem (MERREM) 1 g in sodium chloride 0.9 % 100 mL IVPB (has no administration in time range)    Pertinent labs & imaging results that were available during my care of the patient were reviewed by me and considered in my medical decision making (see chart for details).  Blake [name] was evaluated in Emergency Department on 01/17/2020 for the symptoms described in the history of present illness. He was evaluated in the context of the global COVID-19 pandemic, which necessitated consideration that the patient might be at risk for infection with the SARS-CoV-2 virus that causes COVID-19. Institutional protocols and algorithms that pertain to the evaluation of patients at risk for COVID-19 are in a state of rapid change based on information released by regulatory bodies including the CDC and federal and state organizations. These policies and algorithms were followed during the patient's care in the ED.   Patient sent to the ED after outpatient work-up revealed aspiration pneumonia.  Vital signs are unremarkable, patient is not septic but with recent chemotherapy use and steroid spinal injections, patient may be substantially immunosuppressed.  We will plan to start meropenem IV and admit to hospital for further management including swallow evaluation to ensure improvement.  His labs obtained earlier today show a mild leukocytosis of 13,000, unremarkable chemistry panel.  CT scan of the chest today reveals multilobar pneumonia.  Blood cultures obtained in the ED.      ____________________________________________   FINAL CLINICAL IMPRESSION(S) / ED DIAGNOSES    Final diagnoses:  Aspiration pneumonia of right lower lobe, unspecified aspiration pneumonia type [facility])     ED Discharge Orders    None      Portions of this note were generated with dragon  dictation software. Dictation errors may occur despite best attempts at proofreading.   Carrie Mew, MD 01/17/20 [phone]

## 2020-01-17 NOTE — ED Notes (Signed)
Peripheral IV attempt x1, attempted port a cath access, patient refused to let RN place port without numbing medicine.

## 2020-01-17 NOTE — Progress Notes (Signed)
Hematology/Oncology note [facility] [phone] [phone]   Patient Care Team: Idelle Crouch, MD as PCP - General (Internal Medicine) Christene Lye, MD (General Surgery) Clent Jacks, RN as Oncology Nurse Navigator  REFERRING PROVIDER: Idelle Crouch, MD  CHIEF COMPLAINTS/REASON FOR VISIT:  Follow up  esophageal cancer  HISTORY OF PRESENTING ILLNESS:   Blake [name] is a  74 y.o.  male with PMH listed below was seen in consultation at the request of  Idelle Crouch, MD  for evaluation of esophageal cancer Patient has longstanding Barrett's esophagus. 03/21/2019 patient underwent surveillance endoscopy which showed esophageal mucosal changes secondary to long segment Barrett's disease present in the lower third of the esophagus.  Maximal longitudinal extent of these mucosal changes were 7 cm in length.  Mucosa was biopsied with cold forceps for histology in 4 quadrants at the interval of 2 cm in the lower third of esophagus.  2 cm hiatal hernia, gastritis. 03/21/2019 colonoscopy showed diverticulosis in the sigmoid colon.  4 mm polyp in ascending colon was removed. 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  1 out of 4 esophagus biopsy showed Barrett's mucosa with erosion, indefinite for s. Colon polyp biopsy showed tubular adenoma. Patient was referred to cancer center for further evaluation and management.  04/06/2019 CT chest abdomen pelvis showed no evidence of metastatic disease in the chest abdomen or pelvis.  No esophageal mass lesion evident.  Small to moderate hiatal hernia with possible wall thickening proximal stomach.  3 mm left lower lobe pulmonary nodule attention on follow-up recommended to ensure stability.  Tiny hypodensities in the left kidney with attenuation too high to be simple cyst.  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 Jeb Levering.  His initial PET scan 11/08/2004 showed abnormal localization in a right neck mass possibly a lymph node, slightly asymmetric localization at the base of the tongue.  Pathology is not available in current EMR.   #06/09/19, EMR at [facility].  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 EMR specimen was received in multiple fragments and margins can not be evaluated.  1 gastric polyps were biopsied which showed fundic gland polyp. Negative for dysplasia  #08/08/19, to OR for EGD with sessile tumor from 29-33cm, at least 1.5 cm wide. Pathology with the following: 1) esophagus (30cm) - residual invasive adenocarcinoma, at least invasive to muscularis mucosa and 2) esophagus (30cm) - superficial fragments of highly dysplastic epithelium, focally suspicious for intramucosal carcinoma  #  09/12/2019, patient underwent esophagectomy with pathological revealed  Negative esophageal margin, gastric margin, esophagogastrectomy, invasive moderately differentiated esophageal adenocarcinoma involved distal esophageal and GE junction, tumor focally invades the submucosa.  Left gastric lymph node x 4 negative, greater curvature lymph nodes 1/3 is positive for 57m metastatic carcinoma.  Periesophageal lymph node x 6, negative, final pathology stage pT1b pn1, Esophageal adenocarcinoma, Stage IIB Lymph node positive diease, no neoadjuvant chemotherapy, recommended by  Dr. UFanny Skatesand was recommended for adjuvant chemotherapy.  Patient prefers to receive treatment closer to home and patient call back to establish care. No radiation was recommended due to the concern of dehiscence. 11/28/2019, patient had a stent removal.  INTERVAL HISTORY Blake DILAUROis a 74y.o. male who has above history reviewed by me today presents for follow up visit for  management of esophageal  cancer.  Problems and complaints are listed below: Patient had Mediport placed recently on 01/12/2020.  He had x-ray prior and post procedure. Both x-ray showed extensive patchy consolidation throughout the right lung and a mild patchy opacity in the upper left lung.  Worsened. Patient's wife was diagnosed with COVID-19 infection and patient has been tested negative.  Both him and wife received monoclonal antibody treatments.  Prior to the port placement he was also tested negative for COVID-19. He reports some chronic shortness of breath ever since his surgery in [facility] in June 2021.  He did not mention that during his previous visits.  He has some mild cough today.  No fever, chills.  He is here for evaluation of FOLFOX treatment.  He received 1 cycle of Xeloda and oxaliplatin which caused nausea vomiting diarrhea and he did not tolerate well.  He prefers to be switched to FOLFOX.  Review of Systems  Constitutional: Positive for fatigue. Negative for appetite change, chills, fever and unexpected weight change.  HENT:   Negative for hearing loss and voice change.   Eyes: Negative for eye problems and icterus.  Respiratory: Positive for cough and shortness of breath. Negative for chest tightness.   Cardiovascular: Negative for chest pain and leg swelling.  Gastrointestinal: Negative for abdominal distention and abdominal pain.       Heartburn  Endocrine: Negative for hot flashes.  Genitourinary: Negative for difficulty urinating, dysuria and frequency.   Musculoskeletal: Negative for arthralgias.  Skin: Negative for itching and rash.  Neurological: Negative for light-headedness and numbness.  Hematological: Negative for adenopathy. Does not bruise/bleed easily.  Psychiatric/Behavioral: Negative for confusion.    MEDICAL HISTORY:  Past Medical History:  Diagnosis Date  . Arthritis   . Back pain    with leg pain  . Cancer ([place]) 2021   esophageal  . 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 Coronary artery disease   . Diabetes mellitus without complication ([place])   . Dyspnea   . Dysrhythmia   . GERD (gastroesophageal reflux disease)   . Hyperlipidemia   . Hypertension   . Numbness and tingling of both lower extremities    with positioning  . Port-A-Cath in place 2006  . Prostate enlargement   . Skin cancer    several sites/ face cheek    SURGICAL HISTORY: Past Surgical History:  Procedure Laterality Date  . ABDOMINAL SURGERY    . COLONOSCOPY  12/08/2003  . COLONOSCOPY WITH PROPOFOL N/A 03/21/2019   Procedure: COLONOSCOPY WITH PROPOFOL;  Surgeon: Toledo, Benay Pike, MD;  Location: [facility];  Service: Gastroenterology;  Laterality: N/A;  . ESOPHAGOGASTRODUODENOSCOPY (EGD) WITH PROPOFOL N/A 03/21/2019   Procedure: ESOPHAGOGASTRODUODENOSCOPY (EGD) WITH PROPOFOL;  Surgeon: Toledo, Benay Pike, MD;  Location: [facility];  Service: Gastroenterology;  Laterality: N/A;  . esophogeal cancer    . JOINT REPLACEMENT Right    total hip  . LUMBAR LAMINECTOMY/DECOMPRESSION MICRODISCECTOMY N/A 03/03/2018   Procedure: LUMBAR LAMINECTOMY/DECOMPRESSION MICRODISCECTOMY 1 LEVEL-L3-4,L4-5;  Surgeon: Meade Maw, MD;  Location: [facility] ORS;  Service: Neurosurgery;  Laterality: N/A;  . PARTIAL HIP ARTHROPLASTY Right 2014  . PORT-A-CATH REMOVAL    . PORTACATH PLACEMENT Left 01/12/2020   Procedure: INSERTION PORT-A-CATH, with ultrasound fluoroscopy;  Surgeon: Nestor Lewandowsky, MD;  Location: [facility] ORS;  Service: General;  Laterality: Left;  . TONGUE BIOPSY  2006  . TONSILLECTOMY    . TRIGGER FINGER RELEASE  Right 2013  . UPPER GI ENDOSCOPY  01/25/14   multiple gastric polyps    SOCIAL HISTORY: Social History   Socioeconomic History  . Marital status: Married    Spouse name: Not on file  . Number of children: Not on file  . Years of education: Not on file  . Highest education level: Not on file  Occupational  History  . Not on file  Tobacco Use  . Smoking status: Former Smoker    Packs/day: 1.00    Years: 30.00    Pack years: 30.00    Types: Cigarettes    Quit date: 02/18/1994    Years since quitting: 25.9  . Smokeless tobacco: Never Used  Vaping Use  . Vaping Use: Never used  Substance and Sexual Activity  . Alcohol use: Not Currently  . Drug use: No  . Sexual activity: Yes  Other Topics Concern  . Not on file  Social History Narrative  . Not on file   Social Determinants of Health   Financial Resource Strain:   . Difficulty of Paying Living Expenses: Not on file  Food Insecurity:   . Worried About Programme researcher, broadcasting/film/video in the Last Year: Not on file  . Ran Out of Food in the Last Year: Not on file  Transportation Needs:   . Lack of Transportation (Medical): Not on file  . Lack of Transportation (Non-Medical): Not on file  Physical Activity:   . Days of Exercise per Week: Not on file  . Minutes of Exercise per Session: Not on file  Stress:   . Feeling of Stress : Not on file  Social Connections:   . Frequency of Communication with Friends and Family: Not on file  . Frequency of Social Gatherings with Friends and Family: Not on file  . Attends Religious Services: Not on file  . Active Member of Clubs or Organizations: Not on file  . Attends Banker Meetings: Not on file  . Marital Status: Not on file  Intimate Partner Violence:   . Fear of Current or Ex-Partner: Not on file  . Emotionally Abused: Not on file  . Physically Abused: Not on file  . Sexually Abused: Not on file    FAMILY HISTORY: Family History  Problem Relation Age of Onset  . Diabetes Mother   . Congestive Heart Failure Mother   . Breast cancer Mother   . Lung cancer Father     ALLERGIES:  is allergic to sulfa antibiotics.  MEDICATIONS:  Current Outpatient Medications  Medication Sig Dispense Refill  . amiodarone (PACERONE) 200 MG tablet Take 200 mg by mouth daily.    . Blood  Glucose Monitoring Suppl (ONE TOUCH ULTRA SYSTEM KIT) w/Device KIT     . lidocaine-prilocaine (EMLA) cream Apply to affected area once 30 g 3  . metFORMIN (GLUCOPHAGE) 500 MG tablet Take 500 mg by mouth 2 (two) times daily.     . metoprolol succinate (TOPROL-XL) 50 MG 24 hr tablet Take 50 mg by mouth daily.     . Misc. Devices (CHEMO TRANSFER PIN) MISC by Does not apply route.    [name] Kitchen omeprazole (PRILOSEC) 40 MG capsule Take 40 mg by mouth in the morning and at bedtime.   3  . promethazine (PHENERGAN) 25 MG tablet Take 1 tablet (25 mg total) by mouth every 6 (six) hours as needed for nausea or vomiting. 30 tablet 0  . tamsulosin (FLOMAX) 0.4 MG CAPS capsule Take 0.4 mg by mouth daily  after supper.     [name] Kitchen oxyCODONE (OXY IR/ROXICODONE) 5 MG immediate release tablet Take 1 tablet (5 mg total) by mouth every 4 (four) hours as needed for moderate pain ((score 4 to 6)). (Patient not taking: Reported on 01/06/2020) 30 tablet 0   No current facility-administered medications for this visit.     PHYSICAL EXAMINATION: ECOG PERFORMANCE STATUS: 1 - Symptomatic but completely ambulatory Vitals:   01/17/20 0840  BP: (!) 153/55  Pulse: 96  Resp: 18  Temp: 99.5 F (37.5 C)   Filed Weights   01/17/20 0840  Weight: 156 lb 4.8 oz (70.9 kg)    Physical Exam Constitutional:      General: He is not in acute distress. HENT:     Head: Normocephalic and atraumatic.  Eyes:     General: No scleral icterus.    Pupils: Pupils are equal, round, and reactive to light.  Cardiovascular:     Rate and Rhythm: Normal rate and regular rhythm.     Heart sounds: Normal heart sounds.  Pulmonary:     Effort: Pulmonary effort is normal. No respiratory distress.     Breath sounds: No wheezing.  Abdominal:     General: Bowel sounds are normal. There is no distension.     Palpations: Abdomen is soft. There is no mass.     Tenderness: There is no abdominal tenderness.  Musculoskeletal:        General: No deformity.  Normal range of motion.     Cervical back: Normal range of motion and neck supple.  Skin:    General: Skin is warm and dry.     Findings: No erythema or rash.  Neurological:     Mental Status: He is alert and oriented to person, place, and time. Mental status is at baseline.     Cranial Nerves: No cranial nerve deficit.     Coordination: Coordination normal.  Psychiatric:        Mood and Affect: Mood normal.        Behavior: Behavior normal.        Thought Content: Thought content normal.     LABORATORY DATA:  I have reviewed the data as listed Lab Results  Component Value Date   WBC 13.4 (H) 01/17/2020   HGB 12.9 (L) 01/17/2020   HCT 40.5 01/17/2020   MCV 82.8 01/17/2020   PLT 340 01/17/2020   Recent Labs    04/11/19 1230 04/11/19 1230 12/19/19 0906 12/28/19 1328 01/17/20 0840  NA 138   < > 139 137 135  K 4.5   < > 4.4 4.1 4.4  CL 103   < > 103 102 100  CO2 25   < > $R'26 24 24  'AI$ GLUCOSE 199*   < > 227* 157* 175*  BUN 18   < > $R'20 15 20  'am$ CREATININE 1.04   < > 1.05 1.07 0.79  CALCIUM 8.8*   < > 8.8* 8.6* 8.6*  GFRNONAA >60   < > >60 >60 >60  GFRAA >60  --  >60 >60  --   PROT 7.2   < > 6.6 6.8 7.1  ALBUMIN 4.2   < > 3.6 3.4* 2.9*  AST 23   < > $R'17 18 23  'Zh$ ALT 37   < > $R'15 16 24  'uN$ ALKPHOS 91   < > 64 56 72  BILITOT 0.6   < > 0.6 0.5 0.5   < > = values in this interval not displayed.  Iron/TIBC/Ferritin/ %Sat No results found for: IRON, TIBC, FERRITIN, IRONPCTSAT    RADIOGRAPHIC STUDIES: I have personally reviewed the radiological images as listed and agreed with the findings in the report. Chest 2 View  Result Date: 01/11/2020 CLINICAL DATA:  Preoperative exam.  Esophageal cancer EXAM: CHEST - 2 VIEW COMPARISON:  04/06/2019 FINDINGS: Heart size is within normal limits. There is extensive patchy opacity throughout the right lung, some of which has a more nodular configuration. There is also a 9 mm rounded nodular density within the medial aspect of the left upper  lobe. There may be a trace right pleural effusion. No pneumothorax. Degenerative changes of the shoulders and thoracic spine. IMPRESSION: 1. Extensive patchy opacity throughout the right lung, some of which has a more nodular configuration. Findings may represent multifocal pneumonia. Pulmonary metastatic disease is not excluded given the patient's history. Further evaluation with contrast-enhanced CT of the chest is recommended. 2. Additional nodular density noted within the left upper lobe. These results will be called to the ordering clinician or representative by the Radiologist Assistant, and communication documented in the PACS or [employer]. Electronically Signed   By: Davina Poke D.O.   On: 01/11/2020 16:33   CT Chest W Contrast  Result Date: 01/17/2020 CLINICAL DATA:  Follow-up abnormal chest x-ray. Patient with history of esophageal cancer. Status post gastric pull-through procedure and Port-A-Cath placement. EXAM: CT CHEST WITH CONTRAST TECHNIQUE: Multidetector CT imaging of the chest was performed during intravenous contrast administration. CONTRAST:  26mL OMNIPAQUE IOHEXOL 300 MG/ML  SOLN COMPARISON:  Chest CT 04/06/2019 FINDINGS: Cardiovascular: The heart is normal in size. No pericardial effusion. Mild tortuosity and calcification of the thoracic aorta is stable. No dissection. The branch vessels are patent. Stable scattered coronary artery calcifications. Mediastinum/Nodes: Small scattered mediastinal and hilar lymph nodes but no mass or overt adenopathy. These are likely reactive and related to the right lung process. Surgical changes related to a gastric pull-through procedure. No complicating features are identified. Lungs/Pleura: Extensive nodular airspace process in the right lung with loss of volume and areas of subpleural atelectasis. This involves the right upper lobe, right middle lobe and right lower lobe. Aspiration would be a consideration. The left lung is clear. No  infiltrates, edema or pulmonary nodules. No left-sided pleural effusion. Upper Abdomen: No significant upper abdominal findings. Musculoskeletal: No chest wall mass, supraclavicular or axillary adenopathy. The thyroid gland is unremarkable. There is a left-sided Port-A-Cath noted in good position without complicating features. No significant bony findings. IMPRESSION: 1. Extensive nodular airspace process in the right lung with loss of volume and areas of subpleural atelectasis. Polylobar pneumonia with aspiration a consideration. 2. Surgical changes related to a gastric pull-through procedure. No complicating features are identified. 3. No mediastinal or hilar mass or adenopathy. 4. Stable atherosclerotic calcifications involving the aorta and coronary arteries. 5. Aortic atherosclerosis. These results will be called to the ordering clinician or representative by the Radiologist Assistant, and communication documented in the PACS or [employer]. Aortic Atherosclerosis (ICD10-I70.0). Electronically Signed   By: Marijo Sanes M.D.   On: 01/17/2020 11:38   DG Chest Port 1 View  Result Date: 01/12/2020 CLINICAL DATA:  Port insertion, 7 2 cancer EXAM: PORTABLE CHEST 1 VIEW COMPARISON:  01/10/2020 chest radiograph. FINDINGS: Left subclavian Port-A-Cath terminates in lower third of the SVC. Stable cardiomediastinal silhouette with normal heart size. No pneumothorax. No pleural effusion. Extensive patchy consolidation throughout the right lung, worsened. Mild patchy opacity in the upper left lung, slightly worsened.  IMPRESSION: 1. No pneumothorax.  Well-positioned left subclavian Port-A-Cath. 2. Extensive patchy consolidation throughout the right lung and mild patchy opacity in the upper left lung, worsened, compatible with worsening multifocal pneumonia. Electronically Signed   By: Ilona Sorrel M.D.   On: 01/12/2020 09:34   DG PAIN CLINIC C-ARM 1-60 MIN NO REPORT  Result Date: 12/14/2019 Fluoro was used, but  no Radiologist interpretation will be provided. Please refer to "NOTES" tab for provider progress note.  DG C-Arm 1-60 Min-No Report  Result Date: 01/12/2020 Fluoroscopy was utilized by the requesting physician.  No radiographic interpretation.   VAS US CAROTID  Result Date: 11/08/2019 Carotid Arterial Duplex Study Indications:   Carotid artery disease. Risk Factors:  Hyperlipidemia. Other Factors: Carotid stenosis. Performing Technologist: Concha Norway RVT  Examination Guidelines: A complete evaluation includes B-mode imaging, spectral Doppler, color Doppler, and power Doppler as needed of all accessible portions of each vessel. Bilateral testing is considered an integral part of a complete examination. Limited examinations for reoccurring indications may be performed as noted.  Right Carotid Findings: +----------+--------+--------+--------+-----------------------+--------+           PSV cm/sEDV cm/sStenosisPlaque Description     Comments +----------+--------+--------+--------+-----------------------+--------+ CCA Prox  70      20                                              +----------+--------+--------+--------+-----------------------+--------+ CCA Mid   57      12                                              +----------+--------+--------+--------+-----------------------+--------+ CCA Distal68      17                                              +----------+--------+--------+--------+-----------------------+--------+ ICA Prox  64      18                                              +----------+--------+--------+--------+-----------------------+--------+ ICA Mid   140     46      40-59%  smooth and heterogenous         +----------+--------+--------+--------+-----------------------+--------+ ICA Distal92      29                                              +----------+--------+--------+--------+-----------------------+--------+ ECA       91      8                                                +----------+--------+--------+--------+-----------------------+--------+ +----------+--------+-------+----------------+-------------------+           PSV cm/sEDV cmsDescribe        Arm Pressure (mmHG) +----------+--------+-------+----------------+-------------------+ [id number]  Multiphasic, WNL                    +----------+--------+-------+----------------+-------------------+ +---------+--------+--+--------+---------+ VertebralPSV cm/s33EDV cm/sAntegrade +---------+--------+--+--------+---------+ ICA/CCA ratio = 2.45 Left Carotid Findings: +----------+--------+--------+--------+------------------+--------+           PSV cm/sEDV cm/sStenosisPlaque DescriptionComments +----------+--------+--------+--------+------------------+--------+ CCA Prox  117     20                                         +----------+--------+--------+--------+------------------+--------+ CCA Mid   67      14                                         +----------+--------+--------+--------+------------------+--------+ CCA Distal67      14                                         +----------+--------+--------+--------+------------------+--------+ ICA Prox  83      31                                         +----------+--------+--------+--------+------------------+--------+ ICA Mid   89      27      1-39%   calcific                   +----------+--------+--------+--------+------------------+--------+ ICA Distal85      31                                         +----------+--------+--------+--------+------------------+--------+ ECA       136     16                                         +----------+--------+--------+--------+------------------+--------+ +----------+--------+--------+----------------+-------------------+           PSV cm/sEDV cm/sDescribe        Arm Pressure (mmHG)  +----------+--------+--------+----------------+-------------------+ [id number]             Multiphasic, WNL                    +----------+--------+--------+----------------+-------------------+ +---------+--------+--+--------+---------+ VertebralPSV cm/s44EDV cm/sAntegrade +---------+--------+--+--------+---------+ ICA/CCA ratio = 1.3  Summary: Right Carotid: Velocities in the right ICA are consistent with a 60-79%                stenosis. ICA stenosis by velocity suggests the high end of                40-59%, however by 2D area measurement it appears to be 65-70% in                the proximal ICA due to smooth heterogeneous plaque. Left Carotid: Velocities in the left ICA are consistent with a 1-39% stenosis.               Mild calcified plaque in the bulb/ICA. Vertebrals:  Bilateral vertebral arteries demonstrate antegrade flow. Subclavians: Normal flow hemodynamics were seen  in bilateral subclavian              arteries. *See table(s) above for measurements and observations.  Electronically signed by Leotis Pain MD on 11/08/2019 at 5:25:10 PM.    Final        ASSESSMENT & PLAN:  1. 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[place])   2. Encounter for antineoplastic chemotherapy   3. Abnormal CXR   4. Aspiration pneumonia of right lung, unspecified aspiration pneumonia type, unspecified part of lung ([place])   Cancer Staging Malignant neoplasm of lower third of esophagus ([place]) Staging form: Esophagus - Adenocarcinoma, AJCC 8th Edition - Clinical: No stage assigned - Unsigned - Pathologic stage from 12/09/2019: Stage IIB (pT1b, pN1, cM0, G2) - Signed by Earlie Server, MD on 12/09/2019   #Esophageal adenocarcinoma, Stage IIB Patient is status post 1 cycle of Xeloda plus oxaliplatin treatment.  He did not tolerate well due to GI side effects and prefers to be switched to FOLFOX.  Had Mediport placed during interval. Labs are reviewed and discussed with patient .  Hold chemotherapy treatment due to abnormal  x-ray findings.  See below  #Abnormal x-ray Pre and post Mediport placement x-ray both showed airspace opacities in his lungs. I will obtain a stat CT chest with contrast for further evaluation Differential diagnosis, pneumonia versus lung metastasis  Addendum, CT chest was independent reviewed by me I agree with radiologist  that there is extensive nodular airspace process in the right lung with loss of volume and area of subpleural atelectasis.  Polylobar pneumonia with aspiration is a consideration.  Surgical changes related to a gastric pull-through procedure.  No complicating features are identified.  No mediastinal or hilar mass or adenopathy.    Given patient's age, immunocompromise state (recent chemotherapy), symptoms, recommend patient to go to emergency room for evaluation and treatments.  I suspect that patient has aspiration secondary to his esophagectomy surgery and current anatomy structure.  Need speech eval I called emergency room and spoke to the triage nurse.  Supportive care measures are necessary for patient well-being and will be provided as necessary. We spent sufficient time to discuss many aspect of care, questions were answered to patient's satisfaction.  All questions were answered. The patient knows to call the clinic with any problems questions or concerns.  cc Idelle Crouch, MD  Follow-up to be determined we spent sufficient time to discuss many aspect of care, questions were answered to patient's satisfaction.   Earlie Server, MD, PhD Hematology Oncology [facility] Pager- [phone] 01/17/2020

## 2020-01-17 NOTE — Telephone Encounter (Signed)
Per Dr. Tasia Catchings: Pt to go to ER due to Pneumonia. Patient notified and voiced understanding.

## 2020-01-17 NOTE — Progress Notes (Signed)
Pt here for follow up. No new concerns voiced.   

## 2020-01-17 NOTE — H&P (Signed)
Shortness of breath since his surgery.[place] [facility] [place] NAME: Blake [name]    MR#:  [id number]  DATE OF BIRTH:  1945/10/28  DATE OF ADMISSION:  01/17/2020  PRIMARY CARE PHYSICIAN: Idelle Crouch, MD   REQUESTING/REFERRING PHYSICIAN: Dr Maisie Fus  CHIEF COMPLAINT:   Chief Complaint  Patient presents with  . Shortness of Breath    HISTORY OF PRESENT ILLNESS:  Blake [name]  is a 74 y.o. 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  He has been having shortness of breath since his surgery.  He recently had a port placed and the x-ray did show pneumonia last week.  His oncologist was concerned about pneumonia and sent him for CT scan of the chest which showed bilateral pneumonia.  Patient not having any fever.  Does have slight cough but dry.  Does have some shortness of breath with activity going on since his surgery in July.  Has had some weight loss of 32 pounds.  He states he has no difficulty swallowing.  Does have some diarrhea.  PAST MEDICAL HISTORY:   Past Medical History:  Diagnosis Date  . Arthritis   . Back pain    with leg pain  . Cancer ([place]) 2021   esophageal  . 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 Coronary artery disease   . Diabetes mellitus without complication ([place])   . Dyspnea   . Dysrhythmia   . GERD (gastroesophageal reflux disease)   . Hyperlipidemia   . Hypertension   . Numbness and tingling of both lower extremities    with positioning  . Port-A-Cath in place 2006  . Prostate enlargement   . Skin cancer    several sites/ face cheek    PAST SURGICAL HISTORY:   Past Surgical History:  Procedure Laterality Date  . ABDOMINAL SURGERY    . COLONOSCOPY  12/08/2003  . COLONOSCOPY WITH PROPOFOL N/A 03/21/2019   Procedure: COLONOSCOPY WITH PROPOFOL;  Surgeon: Toledo, Benay Pike, MD;  Location:  [facility] ENDOSCOPY;  Service: Gastroenterology;  Laterality: N/A;  . ESOPHAGOGASTRODUODENOSCOPY (EGD) WITH PROPOFOL N/A 03/21/2019   Procedure: ESOPHAGOGASTRODUODENOSCOPY (EGD) WITH PROPOFOL;  Surgeon: Toledo, Benay Pike, MD;  Location: [facility] ENDOSCOPY;  Service: Gastroenterology;  Laterality: N/A;  . esophogeal cancer    . JOINT REPLACEMENT Right    total hip  . LUMBAR LAMINECTOMY/DECOMPRESSION MICRODISCECTOMY N/A 03/03/2018   Procedure: LUMBAR LAMINECTOMY/DECOMPRESSION MICRODISCECTOMY 1 LEVEL-L3-4,L4-5;  Surgeon: Meade Maw, MD;  Location: [facility] ORS;  Service: Neurosurgery;  Laterality: N/A;  . PARTIAL HIP ARTHROPLASTY Right 2014  . PORT-A-CATH REMOVAL    . PORTACATH PLACEMENT Left 01/12/2020   Procedure: INSERTION PORT-A-CATH, with ultrasound fluoroscopy;  Surgeon: Nestor Lewandowsky, MD;  Location: [facility] ORS;  Service: General;  Laterality: Left;  . TONGUE BIOPSY  2006  . TONSILLECTOMY    . TRIGGER FINGER RELEASE Right 2013  . UPPER GI ENDOSCOPY  01/25/14   multiple gastric polyps    SOCIAL HISTORY:   Social History   Tobacco Use  . Smoking status: Former Smoker    Packs/day: 1.00    Years: 30.00    Pack years: 30.00    Types: Cigarettes    Quit date: 02/18/1994    Years since quitting: 25.9  . Smokeless tobacco: Never Used  Substance Use Topics  . Alcohol use: Not Currently    FAMILY HISTORY:   Family  History  Problem Relation Age of Onset  . Diabetes Mother   . Congestive Heart Failure Mother   . Breast cancer Mother   . Lung cancer Father     DRUG ALLERGIES:   Allergies  Allergen Reactions  . Sulfa Antibiotics Rash    REVIEW OF SYSTEMS:  CONSTITUTIONAL: No fever, chills or sweats.  Positive weight loss 32 pounds.  Positive for fatigue. EYES: No blurred or double vision.  Wears glasses. EARS, NOSE, AND THROAT: No tinnitus or ear pain. No sore throat.  Runny nose. RESPIRATORY: Some cough.  Some shortness of breath.  No wheezing or hemoptysis.  CARDIOVASCULAR:  No chest pain, orthopnea, edema.  GASTROINTESTINAL: Some nausea.  No vomiting, or abdominal pain. No blood in bowel movements.  Some cramps in the abdomen.  Has some diarrhea. GENITOURINARY: No dysuria, hematuria.  ENDOCRINE: No polyuria, nocturia,  HEMATOLOGY: No anemia, easy bruising or bleeding SKIN: No rash or lesion. MUSCULOSKELETAL: No joint pain or arthritis.   NEUROLOGIC: No tingling, numbness, weakness.  PSYCHIATRY: No anxiety or depression.   MEDICATIONS AT HOME:   Prior to Admission medications   Medication Sig Start Date End Date Taking? Authorizing Provider  amiodarone (PACERONE) 200 MG tablet Take 200 mg by mouth daily. 11/21/19   [provider]  Blood Glucose Monitoring Suppl (ONE TOUCH ULTRA SYSTEM KIT) w/Device KIT  07/06/07   [provider]  lidocaine-prilocaine (EMLA) cream Apply to affected area once 12/30/19   Earlie Server, MD  metFORMIN (GLUCOPHAGE) 500 MG tablet Take 500 mg by mouth 2 (two) times daily.  12/19/19   [provider]  metoprolol succinate (TOPROL-XL) 50 MG 24 hr tablet Take 50 mg by mouth daily.  09/20/13   [provider]  Misc. Devices (CHEMO TRANSFER PIN) MISC by Does not apply route.    [provider]  omeprazole (PRILOSEC) 40 MG capsule Take 40 mg by mouth in the morning and at bedtime.  09/15/16   [provider]  oxyCODONE (OXY IR/ROXICODONE) 5 MG immediate release tablet Take 1 tablet (5 mg total) by mouth every 4 (four) hours as needed for moderate pain ((score 4 to 6)). Patient not taking: Reported on 01/06/2020 03/04/18   Marin Olp, PA-C  promethazine (PHENERGAN) 25 MG tablet Take 1 tablet (25 mg total) by mouth every 6 (six) hours as needed for nausea or vomiting. 12/26/19   Earlie Server, MD  tamsulosin (FLOMAX) 0.4 MG CAPS capsule Take 0.4 mg by mouth daily after supper.  09/01/16   [provider]  prochlorperazine (COMPAZINE) 10 MG tablet Take 1 tablet (10 mg total) by mouth every 6 (six)  hours as needed (Nausea or vomiting). 12/09/19 12/30/19  Earlie Server, MD      VITAL SIGNS:  Blood pressure 109/66, pulse 77, temperature 98.1 F (36.7 C), temperature source Oral, resp. rate 18, height '5\' 5"'  (1.651 m), weight 72.1 kg, SpO2 94 %.  PHYSICAL EXAMINATION:  GENERAL:  74 y.o.-year-old patient lying in the bed with no acute distress.  EYES: Pupils equal, round, reactive to light and accommodation. No scleral icterus. Extraocular muscles intact.  HEENT: Head atraumatic, normocephalic. Oropharynx and nasopharynx clear.  NECK:  Supple, no jugular venous distention. No thyroid enlargement, no tenderness.  LUNGS: Normal breath sounds bilaterally, no wheezing, rales,rhonchi or crepitation. No use of accessory muscles of respiration.  CARDIOVASCULAR: S1, S2 normal. No murmurs, rubs, or gallops.  ABDOMEN: Soft, nontender, nondistended.  EXTREMITIES: No pedal edema.  NEUROLOGIC: Cranial nerves II through XII  are intact. Muscle strength 5/5 in all extremities. Sensation intact. Gait not checked.  PSYCHIATRIC: The patient is alert and oriented x 3.  SKIN: No rash, lesion, or ulcer.   LABORATORY PANEL:   CBC Recent Labs  Lab 01/17/20 0840  WBC 13.4*  HGB 12.9*  HCT 40.5  PLT 340   ------------------------------------------------------------------------------------------------------------------  Chemistries  Recent Labs  Lab 01/17/20 0840  NA 135  K 4.4  CL 100  CO2 24  GLUCOSE 175*  BUN 20  CREATININE 0.79  CALCIUM 8.6*  AST 23  ALT 24  ALKPHOS 72  BILITOT 0.5   ------------------------------------------------------------------------------------------------------------------  RADIOLOGY:  CT Chest W Contrast  Result Date: 01/17/2020 CLINICAL DATA:  Follow-up abnormal chest x-ray. Patient with history of esophageal cancer. Status post gastric pull-through procedure and Port-A-Cath placement. EXAM: CT CHEST WITH CONTRAST TECHNIQUE: Multidetector CT imaging of the chest  was performed during intravenous contrast administration. CONTRAST:  68m OMNIPAQUE IOHEXOL 300 MG/ML  SOLN COMPARISON:  Chest CT 04/06/2019 FINDINGS: Cardiovascular: The heart is normal in size. No pericardial effusion. Mild tortuosity and calcification of the thoracic aorta is stable. No dissection. The branch vessels are patent. Stable scattered coronary artery calcifications. Mediastinum/Nodes: Small scattered mediastinal and hilar lymph nodes but no mass or overt adenopathy. These are likely reactive and related to the right lung process. Surgical changes related to a gastric pull-through procedure. No complicating features are identified. Lungs/Pleura: Extensive nodular airspace process in the right lung with loss of volume and areas of subpleural atelectasis. This involves the right upper lobe, right middle lobe and right lower lobe. Aspiration would be a consideration. The left lung is clear. No infiltrates, edema or pulmonary nodules. No left-sided pleural effusion. Upper Abdomen: No significant upper abdominal findings. Musculoskeletal: No chest wall mass, supraclavicular or axillary adenopathy. The thyroid gland is unremarkable. There is a left-sided Port-A-Cath noted in good position without complicating features. No significant bony findings. IMPRESSION: 1. Extensive nodular airspace process in the right lung with loss of volume and areas of subpleural atelectasis. Polylobar pneumonia with aspiration a consideration. 2. Surgical changes related to a gastric pull-through procedure. No complicating features are identified. 3. No mediastinal or hilar mass or adenopathy. 4. Stable atherosclerotic calcifications involving the aorta and coronary arteries. 5. Aortic atherosclerosis. These results will be called to the ordering clinician or representative by the Radiologist Assistant, and communication documented in the PACS or [employer] Aortic Atherosclerosis (ICD10-I70.0). Electronically Signed   By:  PMarijo SanesM.D.   On: 01/17/2020 11:38    EKG:   Interpreted by me.  Sinus rhythm 98 bpm, LVH, nonspecific ST-T wave changes  IMPRESSION AND PLAN:   1.  Extensive right lobar pneumonia with possible aspiration.  Leukocytosis.  ER physician gave a dose of meropenem I will switch over to Unasyn and will also give Zithromax.  We will get swallow evaluation for the morning.  Patient states that he does not have a problem swallowing. 2.  Esophageal carcinoma.  Stage IIb.  Follow-up with Dr. YTasia Catchingsas outpatient. 3.  Type 2 diabetes mellitus we will put on sliding scale.  Patient states that he has been off medication with his weight loss. 4.  History of CAD.  On metoprolol 5.  GERD on PPI 6.  History of cancer of the tongue back in 2006.  All the records are reviewed and case discussed with ED provider. Management plans discussed with the patient, and he is in agreement.  Deffered me calling family.  Patient  requires inpatient admission with severe pneumonia and esophageal cancer.  Swallow evaluation in am.    CODE STATUS: Full code  TOTAL TIME TAKING CARE OF THIS PATIENT: 35  minutes.    Loletha Grayer M.D on 01/17/2020 at 5:49 PM  Between 7am to 6pm - Pager - [phone]  After 6pm call admission pager [phone]  [facility]  CC: Primary care physician; Idelle Crouch, MD

## 2020-01-18 ENCOUNTER — Inpatient Hospital Stay: Payer: Medicare Other

## 2020-01-18 ENCOUNTER — Ambulatory Visit: Payer: Medicare Other | Admitting: Student in an Organized Health Care Education/Training Program

## 2020-01-18 DIAGNOSIS — E1169 Type 2 diabetes mellitus with other specified complication: Secondary | ICD-10-CM

## 2020-01-18 DIAGNOSIS — J189 Pneumonia, unspecified organism: Secondary | ICD-10-CM

## 2020-01-18 DIAGNOSIS — K219 Gastro-esophageal reflux disease without esophagitis: Secondary | ICD-10-CM | POA: Diagnosis not present

## 2020-01-18 DIAGNOSIS — C159 Malignant neoplasm of esophagus, unspecified: Secondary | ICD-10-CM | POA: Diagnosis not present

## 2020-01-18 MED ORDER — SODIUM CHLORIDE 0.9% FLUSH: 10.0000 mL | INTRAVENOUS | Status: DC | PRN

## 2020-01-18 MED ORDER — CHLORHEXIDINE GLUCONATE CLOTH 2 % EX PADS
6.0000 | MEDICATED_PAD | Freq: Every day | CUTANEOUS | Status: DC
  Administered 2020-01-18: 6 via TOPICAL
  Filled 2020-01-18: qty 6

## 2020-01-18 NOTE — Progress Notes (Signed)
Report given to [facility] pt going to 1A, pt had bm prior to leaving this unit. Pt ambulated to BR tolerated well

## 2020-01-18 NOTE — ED Notes (Signed)
Pt reports nausea with no relief from zofran

## 2020-01-18 NOTE — Plan of Care (Signed)
  Problem: Respiratory: Goal: Ability to maintain adequate ventilation will improve Outcome: Progressing Goal: Ability to maintain a clear airway will improve Outcome: Progressing   Problem: Elimination: Goal: Will not experience complications related to bowel motility Outcome: Progressing Goal: Will not experience complications related to urinary retention Outcome: Progressing   Problem: Skin Integrity: Goal: Risk for impaired skin integrity will decrease Outcome: Progressing   Problem: Safety: Goal: Ability to remain free from injury will improve Outcome: Progressing

## 2020-01-18 NOTE — Progress Notes (Addendum)
[name] at [name]: Blake [name]    MR#:  [id number]  DATE OF BIRTH:  10-04-1945  SUBJECTIVE:  came in from the cancer center when x-ray showed right-sided pneumonia. Patient did complain to the ER physician with difficulty swallowing however tells me in the speech therapist is not having any trouble swallowing. Speech eval done today. No signs of aspiration noted. Patient overall feels better. Oxygen sats 94% on room air.  Wife in the room at bedside  REVIEW OF SYSTEMS:   Review of Systems  Constitutional: Negative for chills, fever and weight loss.  HENT: Negative for ear discharge, ear pain and nosebleeds.   Eyes: Negative for blurred vision, pain and discharge.  Respiratory: Positive for shortness of breath. Negative for sputum production, wheezing and stridor.   Cardiovascular: Negative for chest pain, palpitations, orthopnea and PND.  Gastrointestinal: Negative for abdominal pain, diarrhea, nausea and vomiting.  Genitourinary: Negative for frequency and urgency.  Musculoskeletal: Negative for back pain and joint pain.  Neurological: Positive for weakness. Negative for sensory change, speech change and focal weakness.  Psychiatric/Behavioral: Negative for depression and hallucinations. The patient is not nervous/anxious.    Tolerating Diet:yes Tolerating PT: self ambulatory  DRUG ALLERGIES:   Allergies  Allergen Reactions  . Sulfa Antibiotics Rash    VITALS:  Blood pressure (!) 133/53, pulse 78, temperature 98.2 F (36.8 C), temperature source Oral, resp. rate 18, height _0  (1.651 m), weight 72.1 kg, SpO2 94 %.  PHYSICAL EXAMINATION:   Physical Exam  GENERAL:  74 y.o.-year-old patient lying in the bed with no acute distress.  EYES: Pupils equal, round, reactive to light and accommodation. No scleral icterus.   HEENT: Head atraumatic, normocephalic. Oropharynx and nasopharynx clear.  NECK:  Supple, no jugular  venous distention. No thyroid enlargement, no tenderness.  LUNGS: Normal breath sounds bilaterally, no wheezing, rales, rhonchi. No use of accessory muscles of respiration.  CARDIOVASCULAR: S1, S2 normal. No murmurs, rubs, or gallops.  ABDOMEN: Soft, nontender, nondistended. Bowel sounds present. No organomegaly or mass.  EXTREMITIES: No cyanosis, clubbing or edema b/l.    NEUROLOGIC: Cranial nerves II through XII are intact. No focal Motor or sensory deficits b/l.   PSYCHIATRIC:  patient is alert and oriented x 3.  SKIN: No obvious rash, lesion, or ulcer.   LABORATORY PANEL:  CBC Recent Labs  Lab 01/17/20 0840  WBC 13.4*  HGB 12.9*  HCT 40.5  PLT 340    Chemistries  Recent Labs  Lab 01/17/20 0840  NA 135  K 4.4  CL 100  CO2 24  GLUCOSE 175*  BUN 20  CREATININE 0.79  CALCIUM 8.6*  AST 23  ALT 24  ALKPHOS 72  BILITOT 0.5   Cardiac Enzymes No results for input(s): TROPONINI in the last 168 hours. RADIOLOGY:  CT Chest W Contrast  Result Date: 01/17/2020 CLINICAL DATA:  Follow-up abnormal chest x-ray. Patient with history of esophageal cancer. Status post gastric pull-through procedure and Port-A-Cath placement. EXAM: CT CHEST WITH CONTRAST TECHNIQUE: Multidetector CT imaging of the chest was performed during intravenous contrast administration. CONTRAST:  45m OMNIPAQUE IOHEXOL 300 MG/ML  SOLN COMPARISON:  Chest CT 04/06/2019 FINDINGS: Cardiovascular: The heart is normal in size. No pericardial effusion. Mild tortuosity and calcification of the thoracic aorta is stable. No dissection. The branch vessels are patent. Stable scattered coronary artery calcifications. Mediastinum/Nodes: Small scattered mediastinal and hilar lymph nodes but no mass or overt adenopathy. These are  likely reactive and related to the right lung process. Surgical changes related to a gastric pull-through procedure. No complicating features are identified. Lungs/Pleura: Extensive nodular airspace process  in the right lung with loss of volume and areas of subpleural atelectasis. This involves the right upper lobe, right middle lobe and right lower lobe. Aspiration would be a consideration. The left lung is clear. No infiltrates, edema or pulmonary nodules. No left-sided pleural effusion. Upper Abdomen: No significant upper abdominal findings. Musculoskeletal: No chest wall mass, supraclavicular or axillary adenopathy. The thyroid gland is unremarkable. There is a left-sided Port-A-Cath noted in good position without complicating features. No significant bony findings. IMPRESSION: 1. Extensive nodular airspace process in the right lung with loss of volume and areas of subpleural atelectasis. Polylobar pneumonia with aspiration a consideration. 2. Surgical changes related to a gastric pull-through procedure. No complicating features are identified. 3. No mediastinal or hilar mass or adenopathy. 4. Stable atherosclerotic calcifications involving the aorta and coronary arteries. 5. Aortic atherosclerosis. These results will be called to the ordering clinician or representative by the Radiologist Assistant, and communication documented in the PACS or [employer]. Aortic Atherosclerosis (ICD10-I70.0). Electronically Signed   By: Marijo Sanes M.D.   On: 01/17/2020 11:38   ASSESSMENT AND PLAN:  Blake [name] is a 74 y.o. male with a history of tongue and esophageal cancer status post resection with gastric pull-through in June 2021, diabetes, hypertension who is sent to the ED from cancer center due to pneumonia.  Patient reportedly has had abnormal chest x-ray since at least October 14.  He has had shortness of breath for the past week, constant, worse with ambulation.  # Extensive right lobar pneumonia with possible aspiration and  Leukocytosis. -  Cont IV Unasyn and  Zithromax.   -patient recently had put a cath placement on 14th October.  -10/20--ST evaluation noted--no overt, clinical s/s of aspiration  during po intake.Possibllity of Esophageal phase Retrograde flow activity occurring at times d/t his reconstruction surgery since the stomach is quite high now.  -no fever -tolerating po diet -blood culture negative  2.  Esophageal carcinoma.  Stage IIb.  Follow-up with Dr. Tasia Catchings as outpatient.  3.  Type 2 diabetes mellitus we will put on sliding scale.  Patient states that he has been off medication with his weight loss.  4.  History of CAD H/o Dysrhythmia - On metoprolol and amiodarone  5.  GERD on PPI  6.  History of cancer of the tongue back in 2006.    Procedures:none Family communication :wife at bedside Consults :none CODE STATUS: FULL DVT Prophylaxis :Enoxaparin  Status is: Inpatient  Remains inpatient appropriate because:IV treatments appropriate due to intensity of illness or inability to take PO and Inpatient level of care appropriate due to severity of illness   Dispo: The patient is from: Home              Anticipated d/c is to: Home              Anticipated d/c date is: 1 day              Patient currently is not medically stable to d/c. patient is admitted with multifocal pneumonia more on the right than the left. Will continue to monitor another 24 hours with IV antibiotics. If improves will discharged tomorrow. Patient and wife agreeable.       TOTAL TIME TAKING CARE OF THIS PATIENT: *25* minutes.  >50% time spent on counselling and  coordination of care  Note: This dictation was prepared with Dragon dictation along with smaller phrase technology. Any transcriptional errors that result from this process are unintentional.  Fritzi Mandes M.D    [facility]   CC: Primary care physician; Idelle Crouch, MDPatient ID: [name], male   DOB: [birth year], 74 y.o.   MRN: [id number]

## 2020-01-18 NOTE — Evaluation (Addendum)
Clinical/Bedside Swallow Evaluation Patient Details  Name: Blake [name] MRN: [id number] Date of Birth: 1946/03/08  Today's Date: 01/18/2020 Time: SLP Start Time (ACUTE ONLY): 78 SLP Stop Time (ACUTE ONLY): 1230 SLP Time Calculation (min) (ACUTE ONLY): 60 min  Past Medical History:  Past Medical History:  Diagnosis Date  . Arthritis   . Back pain    with leg pain  . Cancer ([place]) 2021   esophageal  . 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 Coronary artery disease   . Diabetes mellitus without complication ([place])   . Dyspnea   . Dysrhythmia   . GERD (gastroesophageal reflux disease)   . Hyperlipidemia   . Hypertension   . Numbness and tingling of both lower extremities    with positioning  . Port-A-Cath in place 2006  . Prostate enlargement   . Skin cancer    several sites/ face cheek   Past Surgical History:  Past Surgical History:  Procedure Laterality Date  . ABDOMINAL SURGERY    . COLONOSCOPY  12/08/2003  . COLONOSCOPY WITH PROPOFOL N/A 03/21/2019   Procedure: COLONOSCOPY WITH PROPOFOL;  Surgeon: Toledo, Benay Pike, MD;  Location: [facility] ENDOSCOPY;  Service: Gastroenterology;  Laterality: N/A;  . ESOPHAGOGASTRODUODENOSCOPY (EGD) WITH PROPOFOL N/A 03/21/2019   Procedure: ESOPHAGOGASTRODUODENOSCOPY (EGD) WITH PROPOFOL;  Surgeon: Toledo, Benay Pike, MD;  Location: [facility] ENDOSCOPY;  Service: Gastroenterology;  Laterality: N/A;  . esophogeal cancer    . JOINT REPLACEMENT Right    total hip  . LUMBAR LAMINECTOMY/DECOMPRESSION MICRODISCECTOMY N/A 03/03/2018   Procedure: LUMBAR LAMINECTOMY/DECOMPRESSION MICRODISCECTOMY 1 LEVEL-L3-4,L4-5;  Surgeon: Meade Maw, MD;  Location: [facility] ORS;  Service: Neurosurgery;  Laterality: N/A;  . PARTIAL HIP ARTHROPLASTY Right 2014  . PORT-A-CATH REMOVAL    . PORTACATH PLACEMENT Left 01/12/2020   Procedure: INSERTION PORT-A-CATH, with ultrasound fluoroscopy;  Surgeon: Nestor Lewandowsky, MD;  Location: [facility] ORS;  Service: General;  Laterality: Left;  . TONGUE BIOPSY  2006  . TONSILLECTOMY    . TRIGGER FINGER RELEASE Right 2013  . UPPER GI ENDOSCOPY  01/25/14   multiple gastric polyps   HPI:  Pt is a 74 y.o. 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  Patient reportedly has had abnormal chest x-ray since at least October 14.  He has had shortness of breath for the past week, constant, worse with ambulation, no alleviating factors.  Denies chest pain.  No fevers but positive chills.  No body aches vomiting or diarrhea.  He had Port-A-Cath placement on October 14.  Follow-up x-ray reviewed today by his oncologist showed persistent airspace infiltrate.  They obtained a CT scan of the chest which I reviewed which does show multifocal infiltrates in the right lung primarily.  This is concerning for multi lobar pneumonia.  Pt takes a PPI BID; h/o "acid reflux".    Assessment / Plan / Recommendation Clinical Impression  Pt appears to present w/ adequate oropharyngeal phase swallow w/ No oropharyngeal phase dysphagia noted, No neuromuscular deficits noted.Pt consumed po trials w/ No overt, clinical s/s of aspiration during po trials. Pt appears at reduced risk for prandial aspiration from an oropharyngeal phase standpoint when following general aspiration precautions. Pt has recent h/o Esophageal cancer status post resection w/ Gastric pull-through in June 2021; he has had significant "Acid  Reflux" afterwards but this has eased some, per pt report. He is on a PPI BID. Pt's h/o of tongue cancer was in 2006 w/ no residual difficulty swallowing per pt report. Pt's recent Esophageal reconstruction surgery and Dysmotility can increase risk for Retrograde Backflow of food/liquid material thus increase risk for aspiration of this material  from the Esophagus; and decline in pulmonary status.  During po trials, pt consumed all consistencies w/ no overt coughing, decline in vocal quality, or change in respiratory presentation during/post trials. Oral phase appeared [facility] w/ timely bolus management, mastication, and control of bolus propulsion for A-P transfer for swallowing. Oral clearing achieved w/ all trial consistencies. OM Exam appeared [facility] w/ no unilateral weakness noted. Native dentition. Speech Clear. Pt fed self. Recommend continue a Regular consistency diet w/ well-Cut meats, moistened foods; Thin liquidsVIA CUP - less air vs straw use. Recommend general aspiration precautions, REFLUX/GERD precautions, Pills one at a time for safer, easier swallowing and Esophageal clearing. Education given on food consistencies and easy to eat options; general aspiration precautions; REFLUX/GERD precautions. NSG to reconsult if any new needs arise. Pt agreed. Recommend f/u w/ pt's GI for ongoing management and education of Esophageal motility.  SLP Visit Diagnosis: Dysphagia, pharyngoesophageal phase (R13.14) (suspected risk)    Aspiration Risk   (reduced following precautions)    Diet Recommendation  Regular diet w/ WELL-CUT meats/foods, ALL foods Moistened Well; Thin liquids VIA CUP -- less straw use. General aspiration precautions. STRICT REFLUX/GERD precautions  Medication Administration: Whole meds with liquid (1-2 Pills at one time for ease of Esophageal clearing)    Other  Recommendations Recommended Consults: Consider GI evaluation;Consider esophageal assessment (ongoing management, education) Oral Care Recommendations: Oral care BID;Patient independent with oral care Other Recommendations:  (n/a)   Follow up Recommendations None      Frequency and Duration  (n/a)   (n/a)       Prognosis Prognosis for Safe Diet Advancement: Fair (-Good) Barriers to Reach Goals: Severity of deficits;Time post onset (GI)      Swallow Study    General Date of Onset: 01/17/20 HPI: Pt is a 74 y.o. 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  Patient reportedly has had abnormal chest x-ray since at least October 14.  He has had shortness of breath for the past week, constant, worse with ambulation, no alleviating factors.  Denies chest pain.  No fevers but positive chills.  No body aches vomiting or diarrhea.  He had Port-A-Cath placement on October 14.  Follow-up x-ray reviewed today by his oncologist showed persistent airspace infiltrate.  They obtained a CT scan of the chest which I reviewed which does show multifocal infiltrates in the right lung primarily.  This is concerning for multi lobar pneumonia.  Pt takes a PPI BID; h/o "acid reflux".  Type of Study: Bedside Swallow Evaluation Previous Swallow Assessment: none reported Diet Prior to this Study: Regular;Thin liquids Temperature Spikes Noted: No (wbc elevated at admit) Respiratory Status: Room air (off [place]) History of Recent Intubation: No Behavior/Cognition: Alert;Cooperative;Pleasant mood Oral Cavity Assessment: Within Functional Limits Oral Care Completed by SLP: Recent completion by staff Oral Cavity - Dentition: Adequate natural dentition Vision: Functional for self-feeding Self-Feeding Abilities: Able to feed self Patient Positioning: Upright in bed (EOB) Baseline Vocal Quality: Normal Volitional Cough: Strong Volitional Swallow: Able to elicit  Oral/Motor/Sensory Function Overall Oral Motor/Sensory Function: Within functional limits   Ice Chips Ice chips: Within functional limits Presentation: Spoon (fed)   Thin Liquid Thin Liquid: Within functional limits Presentation: Cup;Self Fed (8-10 Trials) Other Comments: belching    Nectar Thick Nectar Thick Liquid: Not tested   Honey Thick Honey Thick Liquid: Not tested    Puree Puree: Within functional limits Presentation: Spoon;Self Fed (5 trials)   Solid     Solid: Within functional limits Presentation: Self Fed (4 trials)       Orinda Kenner, MS, CCC-SLP Speech Language Pathologist Rehab Services [phone] Tenecia Ignasiak 01/18/2020,12:51 PM

## 2020-01-18 NOTE — Plan of Care (Signed)
  Problem: Activity: Goal: Ability to tolerate increased activity will improve Outcome: Progressing   Problem: Clinical Measurements: Goal: Ability to maintain a body temperature in the normal range will improve Outcome: Progressing   Problem: Respiratory: Goal: Ability to maintain adequate ventilation will improve Outcome: Progressing Goal: Ability to maintain a clear airway will improve Outcome: Progressing   

## 2020-01-19 ENCOUNTER — Other Ambulatory Visit: Payer: Self-pay | Admitting: Internal Medicine

## 2020-01-19 ENCOUNTER — Inpatient Hospital Stay: Payer: Medicare Other

## 2020-01-19 DIAGNOSIS — J690 Pneumonitis due to inhalation of food and vomit: Secondary | ICD-10-CM | POA: Diagnosis not present

## 2020-01-19 DIAGNOSIS — E119 Type 2 diabetes mellitus without complications: Secondary | ICD-10-CM | POA: Diagnosis not present

## 2020-01-19 DIAGNOSIS — C159 Malignant neoplasm of esophagus, unspecified: Secondary | ICD-10-CM | POA: Diagnosis not present

## 2020-01-19 DIAGNOSIS — K219 Gastro-esophageal reflux disease without esophagitis: Secondary | ICD-10-CM | POA: Diagnosis not present

## 2020-01-19 MED ORDER — HEPARIN SOD (PORK) LOCK FLUSH 10 UNIT/ML IV SOLN: 10.0000 [IU] | Freq: Once | INTRAVENOUS | Status: DC

## 2020-01-19 MED ORDER — AMOXICILLIN-POT CLAVULANATE 875-125 MG PO TABS
1.0000 | ORAL_TABLET | Freq: Two times a day (BID) | ORAL | Status: DC
  Administered 2020-01-19: 1 via ORAL
  Filled 2020-01-19: qty 1

## 2020-01-19 MED ORDER — AMOXICILLIN-POT CLAVULANATE 875-125 MG PO TABS
1.0000 | ORAL_TABLET | Freq: Two times a day (BID) | ORAL | 0 refills | Status: DC
Start: 2020-01-19 — End: 2020-06-26

## 2020-01-19 MED ORDER — HEPARIN SOD (PORK) LOCK FLUSH 10 UNIT/ML IV SOLN
100.0000 [IU] | Freq: Once | INTRAVENOUS | Status: DC
  Filled 2020-01-19: qty 10

## 2020-01-19 NOTE — Plan of Care (Signed)
Discharged per MD order - understands instructions- port heparin flushed and de-accessed

## 2020-01-19 NOTE — Discharge Summary (Signed)
[facility] NAME: Kartel Wolbert    MR#:  [id number]  DATE OF BIRTH:  [birth year]  DATE OF ADMISSION:  01/17/2020 ADMITTING PHYSICIAN: Loletha Grayer, MD  DATE OF DISCHARGE: 01/19/2020  PRIMARY CARE PHYSICIAN: Idelle Crouch, MD    ADMISSION DIAGNOSIS:  Pneumonia [J18.9] Aspiration pneumonia of right lower lobe, unspecified aspiration pneumonia type ([place]) [J69.0]  DISCHARGE DIAGNOSIS:  Right side Pneumonia--suspect Aspiration  SECONDARY DIAGNOSIS:   Past Medical History:  Diagnosis Date  . Arthritis   . Back pain    with leg pain  . Cancer ([place]) 2021   esophageal  . 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 Coronary artery disease   . Diabetes mellitus without complication ([place])   . Dyspnea   . Dysrhythmia   . GERD (gastroesophageal reflux disease)   . Hyperlipidemia   . Hypertension   . Numbness and tingling of both lower extremities    with positioning  . Port-A-Cath in place 2006  . Prostate enlargement   . Skin cancer    several sites/ face cheek    HOSPITAL COURSE:  ELBA DENDINGER a 74 y.o.malewith a history of tongue and esophageal cancer status post resectionwith gastric pull-through in June 2021, diabetes, hypertension who is sent to the ED from cancer center due to pneumonia. Patient reportedly has had abnormal chest x-ray since at least October 14. He has had shortness of breath for the past week, constant, worse with ambulation.  # Extensive rightlobar pneumonia with possible aspiration and Leukocytosis. - Cont IV Unasyn and  Zithromax. --change to  Po augmentin  -patient recently had port a cath placement on 14th October.  -10/20--ST evaluation noted--no overt, clinical s/s of aspiration during po intake.Possibllity of Esophageal phase Retrograde flow activity occurring at times d/t his reconstruction surgery since the  stomach is quite high now.  -no fever -tolerating po diet -blood culture negative -sats 92-94% on RA on AMbulation -pt would like to f/u Dr Alice Reichert for swallow evaluation  2. Esophageal carcinoma. Stage IIb. Follow-up with Dr. Tasia Catchings as outpatient.  3. Type 2 diabetes mellitus we will put on sliding scale.  F/u PCP regarding DM-2  4. History of CAD H/o Dysrhythmia -On metoprolol and amiodarone  5. GERD on PPI  6. History of cancer of the tongue back in 2006.    Procedures:none Family communication :wife at bedside yday Consults :none CODE STATUS: FULL DVT Prophylaxis :Enoxaparin  Status is: Inpatient   Dispo: The patient is from: Home  Anticipated d/c is to: Home  Anticipated d/c date is: 1 day  Patient currently is  medically stable to d/c. Pt improved. Feels at baseline. SOB improved. d/c home  CONSULTS OBTAINED:    DRUG ALLERGIES:   Allergies  Allergen Reactions  . Sulfa Antibiotics Rash    DISCHARGE MEDICATIONS:   Allergies as of 01/19/2020      Reactions   Sulfa Antibiotics Rash      Medication List    TAKE these medications   amiodarone 200 MG tablet Commonly known as: PACERONE Take 200 mg by mouth daily.   amoxicillin-clavulanate 875-125 MG tablet Commonly known as: AUGMENTIN Take 1 tablet by mouth every 12 (twelve) hours.   aspirin EC 81 MG tablet Take 81 mg by mouth daily.   lidocaine-prilocaine cream Commonly known as: EMLA Apply to affected area once   metFORMIN 500 MG tablet  Commonly known as: GLUCOPHAGE Take 500 mg by mouth 2 (two) times daily.   metoprolol succinate 100 MG 24 hr tablet Commonly known as: TOPROL-XL Take 50 mg by mouth daily.   omeprazole 40 MG capsule Commonly known as: PRILOSEC Take 40 mg by mouth in the morning and at bedtime.   promethazine 25 MG tablet Commonly known as: PHENERGAN Take 1 tablet (25 mg total) by mouth every 6 (six) hours as needed  for nausea or vomiting.   tamsulosin 0.4 MG Caps capsule Commonly known as: FLOMAX Take 0.4 mg by mouth daily after supper.       If you experience worsening of your admission symptoms, develop shortness of breath, life threatening emergency, suicidal or homicidal thoughts you must seek medical attention immediately by calling 911 or calling your MD immediately  if symptoms less severe.  You Must read complete instructions/literature along with all the possible adverse reactions/side effects for all the Medicines you take and that have been prescribed to you. Take any new Medicines after you have completely understood and accept all the possible adverse reactions/side effects.   Please note  You were cared for by a hospitalist during your hospital stay. If you have any questions about your discharge medications or the care you received while you were in the hospital after you are discharged, you can call the unit and asked to speak with the hospitalist on call if the hospitalist that took care of you is not available. Once you are discharged, your primary care physician will handle any further medical issues. Please note that NO REFILLS for any discharge medications will be authorized once you are discharged, as it is imperative that you return to your primary care physician (or establish a relationship with a primary care physician if you do not have one) for your aftercare needs so that they can reassess your need for medications and monitor your lab values. Today   SUBJECTIVE   No new complaints  VITAL SIGNS:  Blood pressure 128/60, pulse 78, temperature 98.6 F (37 C), temperature source Oral, resp. rate 17, height 5\' 5"  (1.651 m), weight 72.1 kg, SpO2 96 %.  I/O:    Intake/Output Summary (Last 24 hours) at 01/19/2020 0840 Last data filed at 01/19/2020 0603 Gross per 24 hour  Intake 980 ml  Output 3 ml  Net 977 ml    PHYSICAL EXAMINATION:  GENERAL:  74 y.o.-year-old patient  lying in the bed with no acute distress.  EYES: Pupils equal, round, reactive to light and accommodation. No scleral icterus.  HEENT: Head atraumatic, normocephalic. Oropharynx and nasopharynx clear.  NECK:  Supple, no jugular venous distention. No thyroid enlargement, no tenderness.  LUNGS: Normal breath sounds bilaterally, no wheezing, rales,rhonchi or crepitation. No use of accessory muscles of respiration. Port cath + CARDIOVASCULAR: S1, S2 normal. No murmurs, rubs, or gallops.  ABDOMEN: Soft, non-tender, non-distended. Bowel sounds present. No organomegaly or mass.  EXTREMITIES: No pedal edema, cyanosis, or clubbing.  NEUROLOGIC: Cranial nerves II through XII are intact. Muscle strength 5/5 in all extremities. Sensation intact. Gait not checked.  PSYCHIATRIC: The patient is alert and oriented x 3.  SKIN: No obvious rash, lesion, or ulcer.   DATA REVIEW:   CBC  Recent Labs  Lab 01/17/20 0840  WBC 13.4*  HGB 12.9*  HCT 40.5  PLT 340    Chemistries  Recent Labs  Lab 01/17/20 0840  NA 135  K 4.4  CL 100  CO2 24  GLUCOSE 175*  BUN 20  CREATININE 0.79  CALCIUM 8.6*  AST 23  ALT 24  ALKPHOS 72  BILITOT 0.5    Microbiology Results   Recent Results (from the past 240 hour(s))  SARS CORONAVIRUS 2 (TAT 6-24 HRS) Nasopharyngeal Nasopharyngeal Swab     Status: None   Collection Time: 01/10/20  9:45 AM   Specimen: Nasopharyngeal Swab  Result Value Ref Range Status   SARS Coronavirus 2 NEGATIVE NEGATIVE Final    Comment: (NOTE) SARS-CoV-2 target nucleic acids are NOT DETECTED.  The SARS-CoV-2 RNA is generally detectable in upper and lower respiratory specimens during the acute phase of infection. Negative results do not preclude SARS-CoV-2 infection, do not rule out co-infections with other pathogens, and should not be used as the sole basis for treatment or other patient management decisions. Negative results must be combined with clinical observations, patient  history, and epidemiological information. The expected result is Negative.  Fact Sheet for Patients: SugarRoll.be  Fact Sheet for Healthcare Providers: https://www.woods-mathews.com/  This test is not yet approved or cleared by the [place] FDA and  has been authorized for detection and/or diagnosis of SARS-CoV-2 by FDA under an Emergency Use Authorization (EUA). This EUA will remain  in effect (meaning this test can be used) for the duration of the COVID-19 declaration under Se ction 564(b)(1) of the Act, 21 U.S.C. section 360bbb-3(b)(1), unless the authorization is terminated or revoked sooner.  Performed at [facility], [place] [address], [place], [place] [address]   Blood culture (routine x 2)     Status: None (Preliminary result)   Collection Time: 01/17/20  2:45 PM   Specimen: BLOOD  Result Value Ref Range Status   Specimen Description BLOOD RIGHT ASSIST CONTROL  Final   Special Requests   Final    BOTTLES DRAWN AEROBIC AND ANAEROBIC Blood Culture adequate volume   Culture   Final    NO GROWTH 2 DAYS Performed at [facility], [address], [place], [place] [address]    Report Status PENDING  Incomplete  Blood culture (routine x 2)     Status: None (Preliminary result)   Collection Time: 01/17/20  2:45 PM   Specimen: BLOOD  Result Value Ref Range Status   Specimen Description BLOOD LEFT ASSIST CONTROL  Final   Special Requests   Final    BOTTLES DRAWN AEROBIC AND ANAEROBIC Blood Culture adequate volume   Culture   Final    NO GROWTH 2 DAYS Performed at [facility], [address], [place], [place] [address]    Report Status PENDING  Incomplete  Respiratory Panel by RT PCR (Flu A&B, Covid) - Nasopharyngeal Swab     Status: None   Collection Time: 01/17/20  3:28 PM   Specimen: Nasopharyngeal Swab  Result Value Ref Range Status   SARS Coronavirus 2 by RT PCR NEGATIVE NEGATIVE Final    Comment:  (NOTE) SARS-CoV-2 target nucleic acids are NOT DETECTED.  The SARS-CoV-2 RNA is generally detectable in upper respiratoy specimens during the acute phase of infection. The lowest concentration of SARS-CoV-2 viral copies this assay can detect is 131 copies/mL. A negative result does not preclude SARS-Cov-2 infection and should not be used as the sole basis for treatment or other patient management decisions. A negative result may occur with  improper specimen collection/handling, submission of specimen other than nasopharyngeal swab, presence of viral mutation(s) within the areas targeted by this assay, and inadequate number of viral copies (<131 copies/mL). A negative  result must be combined with clinical observations, patient history, and epidemiological information. The expected result is Negative.  Fact Sheet for Patients:  PinkCheek.be  Fact Sheet for Healthcare Providers:  GravelBags.it  This test is no t yet approved or cleared by the [place] FDA and  has been authorized for detection and/or diagnosis of SARS-CoV-2 by FDA under an Emergency Use Authorization (EUA). This EUA will remain  in effect (meaning this test can be used) for the duration of the COVID-19 declaration under Section 564(b)(1) of the Act, 21 U.S.C. section 360bbb-3(b)(1), unless the authorization is terminated or revoked sooner.     Influenza A by PCR NEGATIVE NEGATIVE Final   Influenza B by PCR NEGATIVE NEGATIVE Final    Comment: (NOTE) The Xpert Xpress SARS-CoV-2/FLU/RSV assay is intended as an aid in  the diagnosis of influenza from Nasopharyngeal swab specimens and  should not be used as a sole basis for treatment. Nasal washings and  aspirates are unacceptable for Xpert Xpress SARS-CoV-2/FLU/RSV  testing.  Fact Sheet for Patients: PinkCheek.be  Fact Sheet for Healthcare  Providers: GravelBags.it  This test is not yet approved or cleared by the [place] FDA and  has been authorized for detection and/or diagnosis of SARS-CoV-2 by  FDA under an Emergency Use Authorization (EUA). This EUA will remain  in effect (meaning this test can be used) for the duration of the  Covid-19 declaration under Section 564(b)(1) of the Act, 21  U.S.C. section 360bbb-3(b)(1), unless the authorization is  terminated or revoked. Performed at [facility], [address], [place], [place] [address]     RADIOLOGY:  CT Chest W Contrast  Result Date: 01/17/2020 CLINICAL DATA:  Follow-up abnormal chest x-ray. Patient with history of esophageal cancer. Status post gastric pull-through procedure and Port-A-Cath placement. EXAM: CT CHEST WITH CONTRAST TECHNIQUE: Multidetector CT imaging of the chest was performed during intravenous contrast administration. CONTRAST:  22mL OMNIPAQUE IOHEXOL 300 MG/ML  SOLN COMPARISON:  Chest CT 04/06/2019 FINDINGS: Cardiovascular: The heart is normal in size. No pericardial effusion. Mild tortuosity and calcification of the thoracic aorta is stable. No dissection. The branch vessels are patent. Stable scattered coronary artery calcifications. Mediastinum/Nodes: Small scattered mediastinal and hilar lymph nodes but no mass or overt adenopathy. These are likely reactive and related to the right lung process. Surgical changes related to a gastric pull-through procedure. No complicating features are identified. Lungs/Pleura: Extensive nodular airspace process in the right lung with loss of volume and areas of subpleural atelectasis. This involves the right upper lobe, right middle lobe and right lower lobe. Aspiration would be a consideration. The left lung is clear. No infiltrates, edema or pulmonary nodules. No left-sided pleural effusion. Upper Abdomen: No significant upper abdominal findings. Musculoskeletal: No chest  wall mass, supraclavicular or axillary adenopathy. The thyroid gland is unremarkable. There is a left-sided Port-A-Cath noted in good position without complicating features. No significant bony findings. IMPRESSION: 1. Extensive nodular airspace process in the right lung with loss of volume and areas of subpleural atelectasis. Polylobar pneumonia with aspiration a consideration. 2. Surgical changes related to a gastric pull-through procedure. No complicating features are identified. 3. No mediastinal or hilar mass or adenopathy. 4. Stable atherosclerotic calcifications involving the aorta and coronary arteries. 5. Aortic atherosclerosis. These results will be called to the ordering clinician or representative by the Radiologist Assistant, and communication documented in the PACS or [employer]. Aortic Atherosclerosis (ICD10-I70.0). Electronically Signed   By: Marijo Sanes M.D.   On:  01/17/2020 11:38     CODE STATUS:     Code Status Orders  (From admission, onward)         Start     Ordered   01/17/20 1749  Full code  Continuous        01/17/20 1749        Code Status History    Date Active Date Inactive Code Status Order ID Comments User Context   03/03/2018 1336 03/04/2018 1848 Full Code [id number]  Marin Olp, PA-C Inpatient   Advance Care Planning Activity    Advance Directive Documentation     Most Recent Value  Type of Advance Directive Healthcare Power of [place], Living will  Pre-existing out of facility DNR order (yellow form or pink MOST form) --  "MOST" Form in Place? --       TOTAL TIME TAKING CARE OF THIS PATIENT: *35* minutes.    Fritzi Mandes M.D  [facility]    CC: Primary care physician; Idelle Crouch, MD

## 2020-01-19 NOTE — Plan of Care (Signed)
  Problem: Activity: Goal: Ability to tolerate increased activity will improve Outcome: Progressing   Problem: Clinical Measurements: Goal: Ability to maintain a body temperature in the normal range will improve Outcome: Progressing   Problem: Respiratory: Goal: Ability to maintain adequate ventilation will improve Outcome: Progressing Goal: Ability to maintain a clear airway will improve Outcome: Progressing   Problem: Education: Goal: Knowledge of General Education information will improve Description: Including pain rating scale, medication(s)/side effects and non-pharmacologic comfort measures Outcome: Progressing   Problem: Health Behavior/Discharge Planning: Goal: Ability to manage health-related needs will improve Outcome: Progressing   

## 2020-01-20 ENCOUNTER — Other Ambulatory Visit: Payer: Self-pay

## 2020-01-20 DIAGNOSIS — J189 Pneumonia, unspecified organism: Secondary | ICD-10-CM

## 2020-01-20 NOTE — Progress Notes (Unsigned)
[email or web address]  

## 2020-01-22 LAB — CULTURE, BLOOD (ROUTINE X 2)
Culture: NO GROWTH
Culture: NO GROWTH
Special Requests: ADEQUATE
Special Requests: ADEQUATE

## 2020-01-23 ENCOUNTER — Encounter: Payer: Self-pay | Admitting: Cardiothoracic Surgery

## 2020-01-23 ENCOUNTER — Ambulatory Visit
Admission: RE | Admit: 2020-01-23 | Discharge: 2020-01-23 | Disposition: A | Discharge status: Home / Self Care | Payer: Medicare Other | Source: Ambulatory Visit | Attending: Student in an Organized Health Care Education/Training Program | Admitting: Student in an Organized Health Care Education/Training Program

## 2020-01-23 ENCOUNTER — Ambulatory Visit (INDEPENDENT_AMBULATORY_CARE_PROVIDER_SITE_OTHER): Payer: Medicare Other | Admitting: Cardiothoracic Surgery

## 2020-01-23 ENCOUNTER — Encounter: Payer: Medicare Other | Admitting: Cardiothoracic Surgery

## 2020-01-23 ENCOUNTER — Other Ambulatory Visit: Payer: Self-pay

## 2020-01-23 VITALS — BP 112/69 | HR 98 | Temp 98.3°F | Ht 65.0 in | Wt 153.6 lb

## 2020-01-23 DIAGNOSIS — M5136 Other intervertebral disc degeneration, lumbar region: Secondary | ICD-10-CM

## 2020-01-23 DIAGNOSIS — M48062 Spinal stenosis, lumbar region with neurogenic claudication: Secondary | ICD-10-CM | POA: Insufficient documentation

## 2020-01-23 DIAGNOSIS — M5416 Radiculopathy, lumbar region: Secondary | ICD-10-CM | POA: Insufficient documentation

## 2020-01-23 DIAGNOSIS — G894 Chronic pain syndrome: Secondary | ICD-10-CM | POA: Insufficient documentation

## 2020-01-23 DIAGNOSIS — J189 Pneumonia, unspecified organism: Secondary | ICD-10-CM

## 2020-01-23 NOTE — Patient Instructions (Signed)
Patient had suture removed at today's visit with Dr.Oaks. Follow-up with our office as needed. Please call and ask to speak with a nurse if you develop questions or concerns.  GENERAL POST-OPERATIVE PATIENT INSTRUCTIONS   WOUND CARE INSTRUCTIONS:  Keep a dry clean dressing on the wound if there is drainage. The initial bandage may be removed after 24 hours.  Once the wound has quit draining you may leave it open to air.  If clothing rubs against the wound or causes irritation and the wound is not draining you may cover it with a dry dressing during the daytime.  Try to keep the wound dry and avoid ointments on the wound unless directed to do so.  If the wound becomes bright red and painful or starts to drain infected material that is not clear, please contact your physician immediately.  If the wound is mildly pink and has a thick firm ridge underneath it, this is normal, and is referred to as a healing ridge.  This will resolve over the next 4-6 weeks.  BATHING: You may shower if you have been informed of this by your surgeon. However, Please do not submerge in a tub, hot tub, or pool until incisions are completely sealed or have been told by your surgeon that you may do so.  DIET:  You may eat any foods that you can tolerate.  It is a good idea to eat a high fiber diet and take in plenty of fluids to prevent constipation.  If you do become constipated you may want to take a mild laxative or take ducolax tablets on a daily basis until your bowel habits are regular.  Constipation can be very uncomfortable, along with straining, after recent surgery.  ACTIVITY:  You are encouraged to cough and deep breath or use your incentive spirometer if you were given one, every 15-30 minutes when awake.  This will help prevent respiratory complications and low grade fevers post-operatively if you had a general anesthetic.  You may want to hug a pillow when coughing and sneezing to add additional support to the  surgical area, if you had abdominal or chest surgery, which will decrease pain during these times.  You are encouraged to walk and engage in light activity for the next two weeks.  You should not lift more than 20 pounds, until 4 to 6 weeks after surgery as it could put you at increased risk for complications.  Twenty pounds is roughly equivalent to a plastic bag of groceries. At that time- Listen to your body when lifting, if you have pain when lifting, stop and then try again in a few days. Soreness after doing exercises or activities of daily living is normal as you get back in to your normal routine.  MEDICATIONS:  Try to take narcotic medications and anti-inflammatory medications, such as tylenol, ibuprofen, naprosyn, etc., with food.  This will minimize stomach upset from the medication.  Should you develop nausea and vomiting from the pain medication, or develop a rash, please discontinue the medication and contact your physician.  You should not drive, make important decisions, or operate machinery when taking narcotic pain medication.  SUNBLOCK Use sun block to incision area over the next year if this area will be exposed to sun. This helps decrease scarring and will allow you avoid a permanent darkened area over your incision.  QUESTIONS:  Please feel free to call our office if you have any questions, and we will be glad to assist you. (  [phone].    

## 2020-01-23 NOTE — Progress Notes (Signed)
He comes in today without any specific complaints.  He states that he did not get his chemotherapy after his port as there was some concern about his pneumonia.  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  He did have a CT scan which showed some infiltrates in the right lung but the left lung was clear.  In any event his wounds are healing as expected.  We did remove his sutures.  He will complete his antibiotics as an outpatient.  He will have an MRI of his back.  He will see [place] on an as needed basis.

## 2020-01-24 ENCOUNTER — Telehealth: Payer: Self-pay | Admitting: Student in an Organized Health Care Education/Training Program

## 2020-01-24 ENCOUNTER — Ambulatory Visit: Payer: Medicare Other

## 2020-01-24 DIAGNOSIS — M48062 Spinal stenosis, lumbar region with neurogenic claudication: Secondary | ICD-10-CM

## 2020-01-24 DIAGNOSIS — M5136 Other intervertebral disc degeneration, lumbar region: Secondary | ICD-10-CM

## 2020-01-24 DIAGNOSIS — M5416 Radiculopathy, lumbar region: Secondary | ICD-10-CM

## 2020-01-24 NOTE — Telephone Encounter (Signed)
I spoke with Blake [name] this morning to discuss his lumbar MRI results.  Based upon his pathologic findings at L3 specifically his large disc fragment affecting the left L3 and L4 nerve root with diminished pain relief from spinal injections, recommend patient follow-up with Dr. Cari Caraway to discuss surgical intervention at L3-L4 versus spinal cord stimulator trial.

## 2020-02-02 ENCOUNTER — Telehealth: Payer: Self-pay

## 2020-02-02 NOTE — Telephone Encounter (Signed)
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  S/p esophagectomy at [facility] June 2021.  Past medical history of cancer of tongue 2006, back pain, CAD, DM, HLD, HTN.  Patient folfox held due to concerns for pneumonia.   Spoke with patient via phone to introduce service at [facility].  Patient reports that appetite is "fair".  Reports that since he has new plumbing he is still learning foods that he can tolerate.  Reports eating small frequent meals.  "I can't eat much at one time."  Reports issues with diarrhea, reflux, nausea.  Reports able to eat cereal or cheese toast with bacon for breakfast.  No taste for eggs anymore.  Has a taste for sweets.  Lunch is sandwich or burger and dinner is meat and vegetables (friends/family bringing in food).  Patient reports wife has lung cancer and going through treatments.  She is not able to cook.  Drinks muscle milk at times. Does not really like ensure/boost shakes.    Noted evaluated by SLP during recent admission.    Medications: reviewed   Labs: reviewed   Anthropometrics:   Height: 65 inches Weight: 153 lb 10/25 UBW: 195 lb June 2021 before [facility] at [facility] BMI: 25  22% weight loss in the last 4 months, significant   NUTRITION DIAGNOSIS: Inadequate oral intake related to cancer related treatment side effects (surgery) as evidenced by 22% weight loss in the last 4 months and fair appetite    INTERVENTION:  Encouraged patient to continue small frequent meals. Cautioned patient on high sugary foods can make diarrhea worse.   Discussed foods high in protein and calories.   Offered contact number but declined at this time.  Agreeable to phone f/u    MONITORING, EVALUATION, GOAL: weight trends, intake   Next Visit: 4-6 weeks phone call  Jinan Biggins B. Zenia Resides, [place], [place] Registered Dietitian [phone]  (mobile)

## 2020-02-03 ENCOUNTER — Telehealth: Payer: Self-pay

## 2020-02-03 ENCOUNTER — Other Ambulatory Visit: Payer: Self-pay | Admitting: Internal Medicine

## 2020-02-03 NOTE — Telephone Encounter (Addendum)
I have talked with Mr. Budney and he wants to proceed with his back surgery the day after Thanskgiving as he discussed with Dr. Cari Caraway. He feels his back pain is very debilitating. Dr. Doy Hutching agreed at his visit today he should proceed with surgery. I have scheduled him to come back 2 weeks after surgery, around 12-10 to see Dr. Tasia Catchings to discuss further treatment. Again at this time he desires surgery not chemo.

## 2020-02-06 ENCOUNTER — Other Ambulatory Visit: Payer: Self-pay | Admitting: Internal Medicine

## 2020-02-09 ENCOUNTER — Other Ambulatory Visit: Payer: Self-pay | Admitting: Neurosurgery

## 2020-02-13 ENCOUNTER — Other Ambulatory Visit: Payer: Self-pay | Admitting: Cardiology

## 2020-02-17 ENCOUNTER — Other Ambulatory Visit: Payer: Self-pay

## 2020-02-17 ENCOUNTER — Encounter
Admission: RE | Admit: 2020-02-17 | Discharge: 2020-02-17 | Disposition: A | Discharge status: Home / Self Care | Payer: Medicare Other | Source: Ambulatory Visit | Attending: Neurosurgery | Admitting: Neurosurgery

## 2020-02-17 DIAGNOSIS — Z01812 Encounter for preprocedural laboratory examination: Secondary | ICD-10-CM | POA: Insufficient documentation

## 2020-02-17 DIAGNOSIS — R799 Abnormal finding of blood chemistry, unspecified: Secondary | ICD-10-CM | POA: Insufficient documentation

## 2020-02-17 LAB — APTT: aPTT: 32 seconds (ref 24–36)

## 2020-02-17 LAB — URINALYSIS, ROUTINE W REFLEX MICROSCOPIC
Bilirubin Urine: NEGATIVE
Glucose, UA: NEGATIVE mg/dL
Hgb urine dipstick: NEGATIVE
Ketones, ur: NEGATIVE mg/dL
Leukocytes,Ua: NEGATIVE
Nitrite: NEGATIVE
Protein, ur: NEGATIVE mg/dL
Specific Gravity, Urine: 1.012 (ref 1.005–1.030)
pH: 5 (ref 5.0–8.0)

## 2020-02-17 LAB — CBC
HCT: 40.5 % (ref 39.0–52.0)
Hemoglobin: 12.8 g/dL — ABNORMAL LOW (ref 13.0–17.0)
MCH: 25.9 pg — ABNORMAL LOW (ref 26.0–34.0)
MCHC: 31.6 g/dL (ref 30.0–36.0)
MCV: 81.8 fL (ref 80.0–100.0)
Platelets: 437 10*3/uL — ABNORMAL HIGH (ref 150–400)
RBC: 4.95 MIL/uL (ref 4.22–5.81)
RDW: 16.4 % — ABNORMAL HIGH (ref 11.5–15.5)
WBC: 16.5 10*3/uL — ABNORMAL HIGH (ref 4.0–10.5)
nRBC: 0 % (ref 0.0–0.2)

## 2020-02-17 LAB — SURGICAL PCR SCREEN
MRSA, PCR: NEGATIVE
Staphylococcus aureus: NEGATIVE

## 2020-02-17 LAB — TYPE AND SCREEN
ABO/RH(D): O POS
Antibody Screen: NEGATIVE

## 2020-02-17 LAB — PROTIME-INR
INR: 1 (ref 0.8–1.2)
Prothrombin Time: 12.9 seconds (ref 11.4–15.2)

## 2020-02-17 NOTE — Patient Instructions (Addendum)
Your procedure is scheduled on: Friday February 24, 2020. Report to Day Surgery inside [place] 2nd floor (Stop at Registration Desk first). To find out your arrival time please call [phone] between 1PM - 3PM on Wednesday February 22, 2020.  Remember: Instructions that are not followed completely may result in serious medical risk,  up to and including death, or upon the discretion of your surgeon and anesthesiologist your  surgery may need to be rescheduled.     _X__ 1. Do not eat food after midnight the night before your procedure.                 No chewing gum or hard candies. You may drink clear liquids up to 2 hours                 before you are scheduled to arrive for your surgery- DO not drink clear                 liquids within 2 hours of the start of your surgery.                 Clear Liquids include:  water, apple juice without pulp, clear Gatorade, G2 or                  Gatorade Zero (avoid Red/Purple/Blue), Black Coffee or Tea (Do not add                 anything to coffee or tea).  __X__2.  On the morning of surgery brush your teeth with toothpaste and water, you                may rinse your mouth with mouthwash if you wish.  Do not swallow any toothpaste of mouthwash.     _X__ 3.  No Alcohol for 24 hours before or after surgery.   _X__ 4.  Do Not Smoke or use e-cigarettes For 24 Hours Prior to Your Surgery.                 Do not use any chewable tobacco products for at least 6 hours prior to                 Surgery.  _X__  5.  Do not use any recreational drugs (marijuana, cocaine, heroin, ecstasy, MDMA or other)                For at least one week prior to your surgery.  Combination of these drugs with anesthesia                May have life threatening results.  __X__6.  Notify your doctor if there is any change in your medical condition      (cold, fever, infections).     Do not wear jewelry, make-up, hairpins, clips or nail  polish. Do not wear lotions, powders, or perfumes. You may wear deodorant. Do not shave 48 hours prior to surgery. Men may shave face and neck. Do not bring valuables to the hospital.    [facility] is not responsible for any belongings or valuables.  Contacts, dentures or bridgework may not be worn into surgery. Leave your suitcase in the car. After surgery it may be brought to your room. For patients admitted to the hospital, discharge time is determined by your treatment team.   Patients discharged the day of surgery will not be allowed to drive home.   Make arrangements  for someone to be with you for the first 24 hours of your Same Day Discharge.    Please read over the following fact sheets that you were given:      __X__ Take these medicines the morning of surgery with A SIP OF WATER:    1. gabapentin (NEURONTIN) 300 MG   2. metoprolol succinate (TOPROL-XL) 100 MG  3. omeprazole (PRILOSEC) 40 MG  4. tamsulosin (FLOMAX) 0.4 MG     ____ Fleet Enema (as directed)   __X__ Use CHG Soap (or wipes) as directed  ____ Use Benzoyl Peroxide Gel as instructed  ____ Use inhalers on the day of surgery  __X__ Stop metforminmetFORMIN (GLUCOPHAGE) 500 mg 2 days prior to surgery (last dose Tuesday November 23)    ____ Take 1/2 of usual insulin dose the night before surgery. No insulin the morning          of surgery.   __X__ Stop aspirin as instructed by your doctor.   __X__ Stop Anti-inflammatories on such as Ibuprofen, Aleve, Advil, naproxen and or BC powders.    __X__ Stop supplements until after surgery.    __X__ Do not start any herbal supplements before your procedure.    If you have any questions regarding your pre-procedure instructions,  Please call Pre-admit Testing at [phone].

## 2020-02-17 NOTE — Progress Notes (Addendum)
°  [facility] Perioperative Services: Pre-Admission/Anesthesia Testing  Abnormal Lab Notification   Date: 02/17/20  Name: Blake [name] MRN:   [id number]  Re: Abnormal labs noted during PAT appointment   Provider(s) Notified: Meade Maw, MD and Earlie Server, MD Notification mode: Routed and/or faxed via [facility]   ABNORMAL LAB VALUE(S): Lab Results  Component Value Date   WBC 16.5 (H) 02/17/2020   Notes:  Patient is scheduled for a LEFT L3-4 MICRODISCECTOMY, L4-5 DECOMPRESSION (N/A ) on 02/24/2020.   Patient underwent port-a-cath placement on 01/12/2020. Post procedural CXR revealed multi-focal PNA; questioned aspiration. Patient was discharged home. Patient saw oncology on 01/17/2020 and was subsequently sent to the ED when he had CT imaging of the chest that revealed polylobar pneumonia. WBC 13.4 in the ED. Blood cultures x 2 negative.  Influenza and SARS-CoV-2 testing negative.  Patient treated with intravenous meropenem in the ED. He was admitted to the hospital on 01/17/2020 Antimicrobial coverage switched over to intravenous Unasyn and azithromycin.  Patient improved and was ultimately discharged on 01/19/2020 with a prescription for Augmentin.  Despite the aforementioned multiple antimicrobial agents, patient presents today with worsened leukocytosis. Will forward to neurosurgeon and oncology for review.   This is a Community education officer; no formal response is required.  Honor Loh, MSN, APRN, FNP-C, CEN [facility]  Peri-operative Services Nurse Practitioner Phone: [phone] 02/17/20 2:34 PM

## 2020-02-22 ENCOUNTER — Other Ambulatory Visit: Payer: Self-pay

## 2020-02-22 ENCOUNTER — Other Ambulatory Visit
Admission: RE | Admit: 2020-02-22 | Discharge: 2020-02-22 | Disposition: A | Discharge status: Home / Self Care | Payer: Medicare Other | Source: Ambulatory Visit | Attending: Neurosurgery | Admitting: Neurosurgery

## 2020-02-22 DIAGNOSIS — Z20822 Contact with and (suspected) exposure to covid-19: Secondary | ICD-10-CM | POA: Diagnosis not present

## 2020-02-22 DIAGNOSIS — Z01812 Encounter for preprocedural laboratory examination: Secondary | ICD-10-CM | POA: Insufficient documentation

## 2020-02-22 LAB — SARS CORONAVIRUS 2 (TAT 6-24 HRS): SARS Coronavirus 2: NEGATIVE

## 2020-02-24 ENCOUNTER — Other Ambulatory Visit: Payer: Self-pay

## 2020-02-24 ENCOUNTER — Encounter: Payer: Self-pay | Admitting: Neurosurgery

## 2020-02-24 ENCOUNTER — Ambulatory Visit
Admission: RE | Admit: 2020-02-24 | Discharge: 2020-02-24 | Disposition: A | Discharge status: Home / Self Care | Payer: Medicare Other | Attending: Neurosurgery | Admitting: Neurosurgery

## 2020-02-24 ENCOUNTER — Encounter
Admission: RE | Disposition: A | Discharge status: Home / Self Care | Payer: Self-pay | Source: Home / Self Care | Attending: Neurosurgery

## 2020-02-24 ENCOUNTER — Other Ambulatory Visit: Payer: Self-pay | Admitting: Nurse Practitioner

## 2020-02-24 ENCOUNTER — Ambulatory Visit: Payer: Medicare Other | Admitting: Anesthesiology

## 2020-02-24 ENCOUNTER — Ambulatory Visit: Payer: Medicare Other

## 2020-02-24 DIAGNOSIS — Z882 Allergy status to sulfonamides status: Secondary | ICD-10-CM | POA: Insufficient documentation

## 2020-02-24 DIAGNOSIS — Z79899 Other long term (current) drug therapy: Secondary | ICD-10-CM | POA: Diagnosis not present

## 2020-02-24 DIAGNOSIS — C329 Malignant neoplasm of larynx, unspecified: Secondary | ICD-10-CM | POA: Insufficient documentation

## 2020-02-24 DIAGNOSIS — Z955 Presence of coronary angioplasty implant and graft: Secondary | ICD-10-CM | POA: Diagnosis not present

## 2020-02-24 DIAGNOSIS — Z419 Encounter for procedure for purposes other than remedying health state, unspecified: Secondary | ICD-10-CM

## 2020-02-24 DIAGNOSIS — M48062 Spinal stenosis, lumbar region with neurogenic claudication: Secondary | ICD-10-CM | POA: Insufficient documentation

## 2020-02-24 DIAGNOSIS — Z7984 Long term (current) use of oral hypoglycemic drugs: Secondary | ICD-10-CM | POA: Diagnosis not present

## 2020-02-24 DIAGNOSIS — Z87891 Personal history of nicotine dependence: Secondary | ICD-10-CM | POA: Diagnosis not present

## 2020-02-24 DIAGNOSIS — Z801 Family history of malignant neoplasm of trachea, bronchus and lung: Secondary | ICD-10-CM | POA: Insufficient documentation

## 2020-02-24 DIAGNOSIS — Z8249 Family history of ischemic heart disease and other diseases of the circulatory system: Secondary | ICD-10-CM | POA: Diagnosis not present

## 2020-02-24 DIAGNOSIS — Z96641 Presence of right artificial hip joint: Secondary | ICD-10-CM | POA: Insufficient documentation

## 2020-02-24 HISTORY — PX: LUMBAR LAMINECTOMY/DECOMPRESSION MICRODISCECTOMY: SHX5026

## 2020-02-24 LAB — GLUCOSE, CAPILLARY
Glucose-Capillary: 171 mg/dL — ABNORMAL HIGH (ref 70–99)
Glucose-Capillary: 165 mg/dL — ABNORMAL HIGH (ref 70–99)

## 2020-02-24 SURGERY — LUMBAR LAMINECTOMY/DECOMPRESSION MICRODISCECTOMY 2 LEVELS
Anesthesia: General

## 2020-02-24 MED ORDER — OXYCODONE HCL 5 MG PO TABS
5.0000 mg | ORAL_TABLET | Freq: Four times a day (QID) | ORAL | 0 refills | Status: DC | PRN
Start: 2020-02-24 — End: 2020-02-24

## 2020-02-24 MED ORDER — GLYCOPYRROLATE 0.2 MG/ML IJ SOLN
INTRAMUSCULAR | Status: DC | PRN
  Administered 2020-02-24: .2 mg via INTRAVENOUS

## 2020-02-24 MED ORDER — CHLORHEXIDINE GLUCONATE 0.12 % MT SOLN: 15.0000 mL | Freq: Once | OROMUCOSAL | Status: AC

## 2020-02-24 MED ORDER — PROPOFOL 10 MG/ML IV BOLUS
INTRAVENOUS | Status: AC
  Filled 2020-02-24: qty 20

## 2020-02-24 MED ORDER — METHOCARBAMOL 500 MG PO TABS: 500.0000 mg | ORAL_TABLET | Freq: Four times a day (QID) | ORAL | 0 refills | Status: DC | PRN

## 2020-02-24 MED ORDER — EPHEDRINE SULFATE 50 MG/ML IJ SOLN
INTRAMUSCULAR | Status: DC | PRN
  Administered 2020-02-24: 5 mg via INTRAVENOUS
  Administered 2020-02-24: 10 mg via INTRAVENOUS

## 2020-02-24 MED ORDER — METHYLPREDNISOLONE ACETATE 40 MG/ML IJ SUSP
INTRAMUSCULAR | Status: DC | PRN
  Administered 2020-02-24: 40 mg

## 2020-02-24 MED ORDER — PHENYLEPHRINE HCL (PRESSORS) 10 MG/ML IV SOLN
INTRAVENOUS | Status: AC
  Filled 2020-02-24: qty 1

## 2020-02-24 MED ORDER — SUCCINYLCHOLINE CHLORIDE 200 MG/10ML IV SOSY
PREFILLED_SYRINGE | INTRAVENOUS | Status: AC
  Filled 2020-02-24: qty 10

## 2020-02-24 MED ORDER — ACETAMINOPHEN 10 MG/ML IV SOLN
INTRAVENOUS | Status: DC | PRN
  Administered 2020-02-24: 1000 mg via INTRAVENOUS

## 2020-02-24 MED ORDER — DEXAMETHASONE SODIUM PHOSPHATE 10 MG/ML IJ SOLN
INTRAMUSCULAR | Status: DC | PRN
  Administered 2020-02-24: 10 mg via INTRAVENOUS

## 2020-02-24 MED ORDER — METHOCARBAMOL 500 MG PO TABS
500.0000 mg | ORAL_TABLET | Freq: Four times a day (QID) | ORAL | 0 refills | Status: DC | PRN
Start: 2020-02-24 — End: 2020-02-24

## 2020-02-24 MED ORDER — SODIUM CHLORIDE 0.9 % IV SOLN: INTRAVENOUS | Status: DC

## 2020-02-24 MED ORDER — SUCCINYLCHOLINE CHLORIDE 20 MG/ML IJ SOLN
INTRAMUSCULAR | Status: DC | PRN
  Administered 2020-02-24: 100 mg via INTRAVENOUS

## 2020-02-24 MED ORDER — LIDOCAINE HCL (PF) 2 % IJ SOLN
INTRAMUSCULAR | Status: AC
  Filled 2020-02-24: qty 5

## 2020-02-24 MED ORDER — FIBRIN SEALANT 2 ML SINGLE DOSE KIT
PACK | CUTANEOUS | Status: DC | PRN
  Administered 2020-02-24: 2 mL via TOPICAL

## 2020-02-24 MED ORDER — CEFAZOLIN SODIUM-DEXTROSE 2-4 GM/100ML-% IV SOLN
2.0000 g | INTRAVENOUS | Status: AC
  Administered 2020-02-24: 2 g via INTRAVENOUS

## 2020-02-24 MED ORDER — LIDOCAINE HCL (CARDIAC) PF 100 MG/5ML IV SOSY
PREFILLED_SYRINGE | INTRAVENOUS | Status: DC | PRN
  Administered 2020-02-24: 50 mg via INTRAVENOUS

## 2020-02-24 MED ORDER — ACETAMINOPHEN 10 MG/ML IV SOLN
INTRAVENOUS | Status: AC
  Filled 2020-02-24: qty 100

## 2020-02-24 MED ORDER — ONDANSETRON HCL 4 MG/2ML IJ SOLN
INTRAMUSCULAR | Status: AC
  Filled 2020-02-24: qty 2

## 2020-02-24 MED ORDER — KETAMINE HCL 50 MG/ML IJ SOLN
INTRAMUSCULAR | Status: AC
  Filled 2020-02-24: qty 10

## 2020-02-24 MED ORDER — SODIUM CHLORIDE 0.9 % IV SOLN
INTRAVENOUS | Status: DC | PRN
  Administered 2020-02-24: 60 mL

## 2020-02-24 MED ORDER — ORAL CARE MOUTH RINSE: 15.0000 mL | Freq: Once | OROMUCOSAL | Status: AC

## 2020-02-24 MED ORDER — FENTANYL CITRATE (PF) 100 MCG/2ML IJ SOLN
INTRAMUSCULAR | Status: DC | PRN
  Administered 2020-02-24 (×4): 50 ug via INTRAVENOUS

## 2020-02-24 MED ORDER — ONDANSETRON HCL 4 MG/2ML IJ SOLN
INTRAMUSCULAR | Status: DC | PRN
  Administered 2020-02-24: 4 mg via INTRAVENOUS

## 2020-02-24 MED ORDER — KETAMINE HCL 50 MG/ML IJ SOLN
INTRAMUSCULAR | Status: DC | PRN
  Administered 2020-02-24: 25 mg via INTRAVENOUS

## 2020-02-24 MED ORDER — THROMBIN 5000 UNITS EX SOLR
CUTANEOUS | Status: DC | PRN
  Administered 2020-02-24: 5000 [IU] via TOPICAL

## 2020-02-24 MED ORDER — FENTANYL CITRATE (PF) 100 MCG/2ML IJ SOLN
INTRAMUSCULAR | Status: AC
  Filled 2020-02-24: qty 2

## 2020-02-24 MED ORDER — CEFAZOLIN SODIUM-DEXTROSE 2-4 GM/100ML-% IV SOLN
INTRAVENOUS | Status: AC
  Filled 2020-02-24: qty 100

## 2020-02-24 MED ORDER — PHENYLEPHRINE HCL (PRESSORS) 10 MG/ML IV SOLN
INTRAVENOUS | Status: DC | PRN
  Administered 2020-02-24 (×2): 200 ug via INTRAVENOUS
  Administered 2020-02-24 (×2): 100 ug via INTRAVENOUS

## 2020-02-24 MED ORDER — DEXAMETHASONE SODIUM PHOSPHATE 10 MG/ML IJ SOLN
INTRAMUSCULAR | Status: AC
  Filled 2020-02-24: qty 1

## 2020-02-24 MED ORDER — PROPOFOL 10 MG/ML IV BOLUS
INTRAVENOUS | Status: DC | PRN
  Administered 2020-02-24: 100 mg via INTRAVENOUS
  Administered 2020-02-24: 20 mg via INTRAVENOUS

## 2020-02-24 MED ORDER — CHLORHEXIDINE GLUCONATE 0.12 % MT SOLN
OROMUCOSAL | Status: AC
  Administered 2020-02-24: 15 mL via OROMUCOSAL
  Filled 2020-02-24: qty 15

## 2020-02-24 MED ORDER — EPHEDRINE 5 MG/ML INJ
INTRAVENOUS | Status: AC
  Filled 2020-02-24: qty 10

## 2020-02-24 MED ORDER — SODIUM CHLORIDE 0.9 % IV SOLN
INTRAVENOUS | Status: DC | PRN
  Administered 2020-02-24: 50 ug/min via INTRAVENOUS

## 2020-02-24 MED ORDER — BUPIVACAINE-EPINEPHRINE (PF) 0.5% -1:200000 IJ SOLN
INTRAMUSCULAR | Status: DC | PRN
  Administered 2020-02-24: 5 mL

## 2020-02-24 MED ORDER — GLYCOPYRROLATE 0.2 MG/ML IJ SOLN
INTRAMUSCULAR | Status: AC
  Filled 2020-02-24: qty 1

## 2020-02-24 SURGICAL SUPPLY — 57 items
ADH SKN CLS APL DERMABOND .7 (GAUZE/BANDAGES/DRESSINGS) ×1
AGENT HMST MTR 8 SURGIFLO (HEMOSTASIS) ×1
APL PRP STRL LF DISP 70% ISPRP (MISCELLANEOUS) ×2
BUR NEURO DRILL SOFT 3.0X3.8M (BURR) ×2 IMPLANT
CANISTER SUCT 1200ML W/VALVE (MISCELLANEOUS) ×4 IMPLANT
CHLORAPREP W/TINT 26 (MISCELLANEOUS) ×4 IMPLANT
CNTNR SPEC 2.5X3XGRAD LEK (MISCELLANEOUS) ×1
CONT SPEC 4OZ STER OR WHT (MISCELLANEOUS) ×1
CONT SPEC 4OZ STRL OR WHT (MISCELLANEOUS) ×1
CONTAINER SPEC 2.5X3XGRAD LEK (MISCELLANEOUS) ×1 IMPLANT
COUNTER NEEDLE 20/40 LG (NEEDLE) ×2 IMPLANT
COVER WAND RF STERILE (DRAPES) ×2 IMPLANT
CUP MEDICINE 2OZ PLAST GRAD ST (MISCELLANEOUS) ×2 IMPLANT
DERMABOND ADVANCED (GAUZE/BANDAGES/DRESSINGS) ×1
DERMABOND ADVANCED .7 DNX12 (GAUZE/BANDAGES/DRESSINGS) ×1 IMPLANT
DRAPE C ARM PK CFD 31 SPINE (DRAPES) ×2 IMPLANT
DRAPE LAPAROTOMY 100X77 ABD (DRAPES) ×2 IMPLANT
DRAPE MICROSCOPE SPINE 48X150 (DRAPES) ×2 IMPLANT
DRAPE SURG 17X11 SM STRL (DRAPES) ×8 IMPLANT
DRSG TEGADERM 4X4.75 (GAUZE/BANDAGES/DRESSINGS) ×2 IMPLANT
DRSG TELFA 4X3 1S NADH ST (GAUZE/BANDAGES/DRESSINGS) ×2 IMPLANT
ELECT CAUTERY BLADE TIP 2.5 (TIP) ×2
ELECT EZSTD 165MM 6.5IN (MISCELLANEOUS) ×2
ELECT REM PT RETURN 9FT ADLT (ELECTROSURGICAL) ×2
ELECTRODE CAUTERY BLDE TIP 2.5 (TIP) ×1 IMPLANT
ELECTRODE EZSTD 165MM 6.5IN (MISCELLANEOUS) ×1 IMPLANT
ELECTRODE REM PT RTRN 9FT ADLT (ELECTROSURGICAL) ×1 IMPLANT
GLOVE BIOGEL PI IND STRL 7.0 (GLOVE) ×1 IMPLANT
GLOVE BIOGEL PI INDICATOR 7.0 (GLOVE) ×1
GLOVE SURG SYN 7.0 (GLOVE) ×4 IMPLANT
GLOVE SURG SYN 8.5  E (GLOVE) ×3
GLOVE SURG SYN 8.5 E (GLOVE) ×3 IMPLANT
GOWN SRG XL LVL 3 NONREINFORCE (GOWNS) ×1 IMPLANT
GOWN STRL NON-REIN TWL XL LVL3 (GOWNS) ×2
GOWN STRL REUS W/ TWL XL LVL3 (GOWN DISPOSABLE) ×1 IMPLANT
GOWN STRL REUS W/TWL XL LVL3 (GOWN DISPOSABLE) ×2
GRADUATE 1200CC STRL 31836 (MISCELLANEOUS) ×2 IMPLANT
GRAFT DURAGEN MATRIX 1WX1L (Tissue) ×2 IMPLANT
KIT SPINAL PRONEVIEW (KITS) ×2 IMPLANT
KNIFE BAYONET SHORT DISCETOMY (MISCELLANEOUS) IMPLANT
MANIFOLD NEPTUNE II (INSTRUMENTS) ×2 IMPLANT
MARKER SKIN DUAL TIP RULER LAB (MISCELLANEOUS) ×6 IMPLANT
NDL SAFETY ECLIPSE 18X1.5 (NEEDLE) ×1 IMPLANT
NEEDLE HYPO 18GX1.5 SHARP (NEEDLE) ×2
NEEDLE HYPO 22GX1.5 SAFETY (NEEDLE) ×2 IMPLANT
NS IRRIG 1000ML POUR BTL (IV SOLUTION) ×2 IMPLANT
PACK LAMINECTOMY NEURO (CUSTOM PROCEDURE TRAY) ×2 IMPLANT
SPOGE SURGIFLO 8M (HEMOSTASIS) ×1
SPONGE SURGIFLO 8M (HEMOSTASIS) ×1 IMPLANT
SUT DVC VLOC 3-0 CL 6 P-12 (SUTURE) ×2 IMPLANT
SUT VIC AB 0 CT1 27 (SUTURE) ×2
SUT VIC AB 0 CT1 27XCR 8 STRN (SUTURE) ×1 IMPLANT
SUT VIC AB 2-0 CT1 18 (SUTURE) ×2 IMPLANT
SYR 30ML LL (SYRINGE) ×4 IMPLANT
SYR 3ML LL SCALE MARK (SYRINGE) ×2 IMPLANT
TOWEL OR 17X26 4PK STRL BLUE (TOWEL DISPOSABLE) ×6 IMPLANT
TUBING CONNECTING 10 (TUBING) ×2 IMPLANT

## 2020-02-24 NOTE — Progress Notes (Signed)
Cefazolin per pharmacy consult  74 yo M    Weight 70 kg  Will order Cefazolin 2 gm IV x 1 for pre-op abx prophylaxis  Chinita Greenland PharmD Clinical Pharmacist 02/24/2020

## 2020-02-24 NOTE — Op Note (Signed)
Indications: Blake [name] is a 74 yo male who presented with lumbar stenosis causing neurogenic claudication due to recurrent stenosis and worsening disc herniation.  Due to failure of conservative management, surgery was recommended.  Findings: significant lateral recess stenosis at L4-5, disc herniation and lateral recess stenosis L3-4  Preoperative Diagnosis: Lumbar Stenosis with neurogenic claudication Postoperative Diagnosis: same   EBL: 25 ml IVF: 800 ml Drains: none Disposition: Extubated and Stable to PACU Complications: none  No foley catheter was placed.   Preoperative Note:   Risks of surgery discussed include: infection, bleeding, stroke, coma, death, paralysis, CSF leak, nerve/spinal cord injury, numbness, tingling, weakness, complex regional pain syndrome, recurrent stenosis and/or disc herniation, vascular injury, development of instability, neck/back pain, need for further surgery, persistent symptoms, development of deformity, and the risks of anesthesia. The patient understood these risks and agreed to proceed.  Operative Note:   1. L4-5 lumbar decompression including left laminforaminotomy 2. Left L3-4 microdiscectomy and laminoforaminotomy.  The patient was then brought from the preoperative center with intravenous access established.  The patient underwent general anesthesia and endotracheal tube intubation, and was then rotated on the [place] rail top where all pressure points were appropriately padded.  The skin was then thoroughly cleansed.  Perioperative antibiotic prophylaxis was administered.  Sterile prep and drapes were then applied and a timeout was then observed.  C-arm was brought into the field under sterile conditions.  The prior incision was marked and injected with local anesthetic. Once this was complete a 6 cm incision was opened with the use of a #10 blade knife.    The metrx tubes were sequentially advanced and confirmed in position at L4-5. An 3mm  by 49mm tube was locked in place to the bed side attachment.  The microscope was then sterilely brought into the field and muscle creep was hemostased with a bipolar and resected with a pituitary rongeur.  A Bovie extender was then used to expose the spinous process and lamina.  Careful attention was placed to not violate the facet capsule. A 3 mm matchstick drill bit was then used to make a hemi-laminotomy trough until the ligamentum flavum was exposed.  This was extended to the base of the spinous process and to the contralateral side to remove all the central bone from each side.  Once this was complete and the underlying ligamentum flavum was visualized, it was dissected with a curette and resected with Kerrison rongeurs.  Extensive ligamentum hypertrophy was noted, requiring a substantial amount of time and care for removal.  The dura was identified and palpated. The kerrison rongeur was then used to remove the medial facet bilaterally until no compression was noted.  A balltip probe was used to confirm decompression of the ipsilateral L5 nerve root.  The prior CSF leak was identified.  Duragen and Tisseel were used to augment closure.     After L4-5 decompression, we moved to L3-4. The Metrx tubes were sequentially advanced under lateral fluoroscopy until a 18 x 40 mm Metrx tube was placed at L3-4 on the left.  The microscope was then used to visualize, and the muscle creep was hemostased with a bipolar and resected with a pituitary rongeur.  A Bovie extender was then used to expose the spinous process and lamina.  Careful attention was placed to not violate the facet capsule. A 3 mm matchstick drill bit was then used to make a hemi-laminotomy trough until the ligamentum flavum was exposed.  This was extended to the base  of the spinous process.  Once this was complete and the underlying ligamentum flavum was visualized this was dissected with an up angle curette and resected with a #2 and #3 mm biting  Kerrison.  The laminotomy opening was also expanded in similar fashion and hemostasis was obtained with Surgifoam and a patty as well as bone wax.  The rostral aspect of the caudal level of the lamina was also resected with a #2 biting Kerrison effort to further enhance exposure.  Once the underlying dura was visualized a Penfield 4 was then used to dissect and expose the traversing nerve root.  Once this was identified a nerve root retractor suction was used to mobilize this medially.  The venous plexus was hemostased with Surgifoam and light bipolar use.  The disc herniation was identified through a defect in the posterior longitudinal ligament.   The disc herniation was identified and dissected free using a balltip probe. The pituitary rongeur was used to remove the extruded disc fragments. Once the thecal sac and nerve root were noted to be relaxed and under less tension the ball-tipped feeler was passed along the foramen distally to to ensure no residual compression was noted.    Depo-Medrol was placed along the nerve root.  The area was irrigated. The tube system was then removed under microscopic visualization and hemostasis was obtained with a bipolar.    The fascial layer was reapproximated with the use of a 0- Vicryl suture.  Subcutaneous tissue layer was reapproximated using 2-0 Vicryl suture.  3-0 nylon was used on the skin. A sterile dressing was placed. The patient was then rotated back to the preoperative bed awakened from anesthesia and taken to recovery all counts are correct in this case.   I performed the entire procedure with the assistance of Blake Face NP as an Pensions consultant.  Meade Maw MD

## 2020-02-24 NOTE — Anesthesia Preprocedure Evaluation (Signed)
Anesthesia Evaluation  Patient identified by MRN, date of birth, ID band Patient awake    Reviewed: Allergy & Precautions, H&P , NPO status , Patient's Chart, lab work & pertinent test results, reviewed documented beta blocker date and time   Airway Mallampati: II  TM Distance: >3 FB Neck ROM: full    Dental  (+) Teeth Intact   Pulmonary shortness of breath and with exertion, pneumonia, resolved, former smoker,    Pulmonary exam normal        Cardiovascular Exercise Tolerance: Good hypertension, + CAD and + Cardiac Stents  Normal cardiovascular exam+ dysrhythmias  Rhythm:regular Rate:Normal     Neuro/Psych  Neuromuscular disease negative psych ROS   GI/Hepatic negative GI ROS, Neg liver ROS,   Endo/Other  negative endocrine ROSdiabetes, Well Controlled  Renal/GU negative Renal ROS  negative genitourinary   Musculoskeletal   Abdominal   Peds  Hematology negative hematology ROS (+)   Anesthesia Other Findings Past Medical History: No date: Arthritis No date: Back pain     Comment:  with leg pain 2021: Cancer ([place])     Comment:  esophageal 2006: Cancer ([place])     Comment:  base of toungue No date: 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,  bilateral No date: Coronary artery disease No date: Diabetes mellitus without complication (HCC) No date: Dyspnea No date: Dysrhythmia No date: GERD (gastroesophageal reflux disease) No date: Hyperlipidemia No date: Hypertension No date: Numbness and tingling of both lower extremities     Comment:  with positioning 2006: Port-A-Cath in place No date: Prostate enlargement No date: Skin cancer     Comment:  several sites/ face cheek Past Surgical History: No date: ABDOMINAL SURGERY 12/08/2003: COLONOSCOPY 03/21/2019: COLONOSCOPY WITH PROPOFOL; N/A     Comment:  Procedure: COLONOSCOPY WITH PROPOFOL;  Surgeon: Toledo,               Benay Pike, MD;  Location: [facility]  ENDOSCOPY;  Service:               Gastroenterology;  Laterality: N/A; 03/21/2019: ESOPHAGOGASTRODUODENOSCOPY (EGD) WITH PROPOFOL; N/A     Comment:  Procedure: ESOPHAGOGASTRODUODENOSCOPY (EGD) WITH               PROPOFOL;  Surgeon: Toledo, Benay Pike, MD;  Location:               [facility] ENDOSCOPY;  Service: Gastroenterology;  Laterality:               N/A; No date: esophogeal cancer No date: JOINT REPLACEMENT; Right     Comment:  total hip 03/03/2018: LUMBAR LAMINECTOMY/DECOMPRESSION MICRODISCECTOMY; N/A     Comment:  Procedure: LUMBAR LAMINECTOMY/DECOMPRESSION               MICRODISCECTOMY 1 LEVEL-L3-4,L4-5;  Surgeon: Meade Maw, MD;  Location: [facility] ORS;  Service: Neurosurgery;              Laterality: N/A; 2014: PARTIAL HIP ARTHROPLASTY; Right No date: PORT-A-CATH REMOVAL 01/12/2020: PORTACATH PLACEMENT; Left     Comment:  Procedure: INSERTION PORT-A-CATH, with ultrasound               fluoroscopy;  Surgeon: Nestor Lewandowsky, MD;  Location: [facility]              ORS;  Service: General;  Laterality: Left; 2006: TONGUE BIOPSY No date: TONSILLECTOMY 2013: [place]; Right 01/25/14: UPPER GI ENDOSCOPY  Comment:  multiple gastric polyps BMI    Body Mass Index: 25.68 kg/m     Reproductive/Obstetrics negative OB ROS                             Anesthesia Physical Anesthesia Plan  ASA: III  Anesthesia Plan: General ETT   Post-op Pain Management:    Induction:   PONV Risk Score and Plan: 3  Airway Management Planned:   Additional Equipment:   Intra-op Plan:   Post-operative Plan:   Informed Consent: I have reviewed the patients History and Physical, chart, labs and discussed the procedure including the risks, benefits and alternatives for the proposed anesthesia with the patient or authorized representative who has indicated his/her understanding and acceptance.     Dental Advisory Given  Plan Discussed with:  CRNA  Anesthesia Plan Comments:         Anesthesia Quick Evaluation

## 2020-02-24 NOTE — Anesthesia Procedure Notes (Signed)
Procedure Name: Intubation Performed by: Rolla Plate, CRNA Pre-anesthesia Checklist: Patient identified, Patient being monitored, Timeout performed, Emergency Drugs available and Suction available Patient Re-evaluated:Patient Re-evaluated prior to induction Oxygen Delivery Method: Circle system utilized Preoxygenation: Pre-oxygenation with 100% oxygen Induction Type: IV induction and Rapid sequence Ventilation: Mask ventilation without difficulty Laryngoscope Size: McGraph and 4 Grade View: Grade I Tube type: Oral Tube size: 7.5 mm Number of attempts: 1 Airway Equipment and Method: Stylet,  Video-laryngoscopy and LTA kit utilized Placement Confirmation: ETT inserted through vocal cords under direct vision,  positive ETCO2 and breath sounds checked- equal and bilateral Secured at: 22 cm Tube secured with: Tape Dental Injury: Teeth and Oropharynx as per pre-operative assessment  Comments: Head and neck neutral align with gentle induction

## 2020-02-24 NOTE — Transfer of Care (Signed)
Immediate Anesthesia Transfer of Care Note  Patient: Blake [name]  Procedure(s) Performed: LEFT L3-4 MICRODISCECTOMY, L4-5 DECOMPRESSION (N/A )  Patient Location: PACU  Anesthesia Type:General  Level of Consciousness: sedated  Airway & Oxygen Therapy: Patient Spontanous Breathing and Patient connected to face mask oxygen  Post-op Assessment: Report given to RN and Post -op Vital signs reviewed and stable  Post vital signs: Reviewed  Last Vitals:  Vitals Value Taken Time  BP 110/56 02/24/20 1102  Temp    Pulse 71 02/24/20 1102  Resp 13 02/24/20 1102  SpO2 99 % 02/24/20 1102    Last Pain:  Vitals:   02/24/20 0712  TempSrc: Temporal  PainSc: 7          Complications: No complications documented.

## 2020-02-24 NOTE — Discharge Instructions (Signed)
Hold your Aspirin for 3 days after surgery.  Your surgeon has performed an operation on your lumbar spine (low back) to relieve pressure on one or more nerves. Many times, patients feel better immediately after surgery and can "overdo it." Even if you feel well, it is important that you follow these activity guidelines. If you do not let your back heal properly from the surgery, you can increase the chance of a disc herniation and/or return of your symptoms. The following are instructions to help in your recovery once you have been discharged from the hospital.  * Do not take anti-inflammatory medications for 3 days after surgery (naproxen [Aleve], ibuprofen [Advil, Motrin], celecoxib [Celebrex], etc.)  Activity    No bending, lifting, or twisting ("BLT"). Avoid lifting objects heavier than 10 pounds (gallon milk jug).  Where possible, avoid household activities that involve lifting, bending, pushing, or pulling such as laundry, vacuuming, grocery shopping, and childcare. Try to arrange for help from friends and family for these activities while your back heals.  Increase physical activity slowly as tolerated.  Taking short walks is encouraged, but avoid strenuous exercise. Do not jog, run, bicycle, lift weights, or participate in any other exercises unless specifically allowed by your doctor. Avoid prolonged sitting, including car rides.  Talk to your doctor before resuming sexual activity.  You should not drive until cleared by your doctor.  Until released by your doctor, you should not return to work or school.  You should rest at home and let your body heal.   You may shower two days after your surgery.  After showering, lightly dab your incision dry. Do not take a tub bath or go swimming for 3 weeks, or until approved by your doctor at your follow-up appointment.  If you smoke, we strongly recommend that you quit.  Smoking has been proven to interfere with normal healing in your back and  will dramatically reduce the success rate of your surgery. Please contact [facility] (800-QUIT-NOW) and use the resources at www.[facility].com for assistance in stopping smoking.  Surgical Incision   If you have a dressing on your incision, you may remove it three days after your surgery. Keep your incision area clean and dry.  If you have staples or stitches on your incision, you should have a follow up scheduled for removal. If you do not have staples or stitches, you will have steri-strips (small pieces of surgical tape) or Dermabond glue. The steri-strips/glue should begin to peel away within about a week (it is fine if the steri-strips fall off before then). If the strips are still in place one week after your surgery, you may gently remove them.  Diet            You may return to your usual diet. Be sure to stay hydrated.  When to Contact us  Although your surgery and recovery will likely be uneventful, you may have some residual numbness, aches, and pains in your back and/or legs. This is normal and should improve in the next few weeks.  However, should you experience any of the following, contact us immediately: . New numbness or weakness . Pain that is progressively getting worse, and is not relieved by your pain medications or rest . Bleeding, redness, swelling, pain, or drainage from surgical incision . Chills or flu-like symptoms . Fever greater than 101.0 F (38.3 C) . Problems with bowel or bladder functions . Difficulty breathing or shortness of breath . Warmth, tenderness, or swelling in your  calf  Contact Information . During office hours (Monday-Friday 9 am to 5 pm), please call your physician at [phone] . After hours and weekends, please call [phone] and an answering service will put you in touch with either Dr. Lacinda Axon or Dr. Izora Ribas.  . For a life-threatening emergency, call 911  Bupivacaine Liposomal Suspension for Injection What is this  medicine? BUPIVACAINE LIPOSOMAL (bue PIV a kane LIP oh som al) is an anesthetic. It causes loss of feeling in the skin or other tissues. It is used to prevent and to treat pain from some procedures. This medicine may be used for other purposes; ask your health care provider or pharmacist if you have questions. COMMON BRAND NAME(S): EXPAREL What should I tell my health care provider before I take this medicine? They need to know if you have any of these conditions:  G6PD deficiency  heart disease  kidney disease  liver disease  low blood pressure  lung or breathing disease, like asthma  an unusual or allergic reaction to bupivacaine, other medicines, foods, dyes, or preservatives  pregnant or trying to get pregnant  breast-feeding How should I use this medicine? This medicine is for injection into the affected area. It is given by a health care professional in a hospital or clinic setting. Talk to your pediatrician regarding the use of this medicine in children. Special care may be needed. Overdosage: If you think you have taken too much of this medicine contact a poison control center or emergency room at once. NOTE: This medicine is only for you. Do not share this medicine with others. What if I miss a dose? This does not apply. What may interact with this medicine? This medicine may interact with the following medications:  acetaminophen  certain antibiotics like dapsone, nitrofurantoin, aminosalicylic acid, sulfonamides  certain medicines for seizures like phenobarbital, phenytoin, valproic acid  chloroquine  cyclophosphamide  flutamide  hydroxyurea  ifosfamide  metoclopramide  nitric oxide  nitroglycerin  nitroprusside  nitrous oxide  other local anesthetics like lidocaine, pramoxine, tetracaine  primaquine  quinine  rasburicase  sulfasalazine This list may not describe all possible interactions. Give your health care provider a list of all the  medicines, herbs, non-prescription drugs, or dietary supplements you use. Also tell them if you smoke, drink alcohol, or use illegal drugs. Some items may interact with your medicine. What should I watch for while using this medicine? Your condition will be monitored carefully while you are receiving this medicine. Be careful to avoid injury while the area is numb, and you are not aware of pain. What side effects may I notice from receiving this medicine? Side effects that you should report to your doctor or health care professional as soon as possible:  allergic reactions like skin rash, itching or hives, swelling of the face, lips, or tongue  seizures  signs and symptoms of a dangerous change in heartbeat or heart rhythm like chest pain; dizziness; fast, irregular heartbeat; palpitations; feeling faint or lightheaded; falls; breathing problems  signs and symptoms of methemoglobinemia such as pale, gray, or blue colored skin; headache; fast heartbeat; shortness of breath; feeling faint or lightheaded, falls; tiredness Side effects that usually do not require medical attention (report to your doctor or health care professional if they continue or are bothersome):  anxious  back pain  changes in taste  changes in vision  constipation  dizziness  fever  nausea, vomiting This list may not describe all possible side effects. Call your doctor for medical  advice about side effects. You may report side effects to FDA at 1-800-FDA-1088. Where should I keep my medicine? This drug is given in a hospital or clinic and will not be stored at home. NOTE: This sheet is a summary. It may not cover all possible information. If you have questions about this medicine, talk to your doctor, pharmacist, or health care provider.  2020 [employer]vier/Gold Standard (2018-12-28 10:48:23)  AMBULATORY SURGERY  DISCHARGE INSTRUCTIONS   1) The drugs that you were given will stay in your system until tomorrow  so for the next 24 hours you should not:  A) Drive an automobile B) Make any legal decisions C) Drink any alcoholic beverage   2) You may resume regular meals tomorrow.  Today it is better to start with liquids and gradually work up to solid foods.  You may eat anything you prefer, but it is better to start with liquids, then soup and crackers, and gradually work up to solid foods.   3) Please notify your doctor immediately if you have any unusual bleeding, trouble breathing, redness and pain at the surgery site, drainage, fever, or pain not relieved by medication.    4) Additional Instructions:        Please contact your physician with any problems or Same Day Surgery at [phone], Monday through Friday 6 am to 4 pm, or [place] at [facility] [address] number at [phone].

## 2020-02-24 NOTE — H&P (Signed)
I have reviewed and confirmed my history and physical from 02/03/2020 with no additions or changes. Plan for Left L3-4 microdiscectomy and L4-5 decomrpession.  Risks and benefits reviewed.  Heart sounds normal no MRG. Chest Clear to Auscultation Bilaterally.

## 2020-02-24 NOTE — Discharge Summary (Signed)
Procedure:  Procedure date: 02/24/2020 Diagnosis: lumbar stenosis with neurogenic claudication    History: Blake [name] is s/p L4-5 lumbar decompression, L L3-4 microdiscectomy POD0: Tolerated procedure well. Evaluated in post op recovery still disoriented from anesthesia but able to answer questions and obey commands.   Physical Exam: Vitals:   02/24/20 0712  BP: (!) 170/57  Pulse: 83  Resp: 16  Temp: 97.9 F (36.6 C)  SpO2: 95%    General: Alert and oriented, lying in bed Strength:5/5 throughout  Sensation: intact and symmetric throughout  Skin: incision clean, dry, intact  Data:  No results for input(s): NA, K, CL, CO2, BUN, CREATININE, LABGLOM, GLUCOSE, CALCIUM in the last 168 hours. No results for input(s): AST, ALT, ALKPHOS in the last 168 hours.  Invalid input(s): TBILI   Recent Labs  Lab 02/17/20 1308  WBC 16.5*  HGB 12.8*  HCT 40.5  PLT 437*   Recent Labs  Lab 02/17/20 1308  APTT 32  INR 1.0         Assessment/Plan:  Blake [name] is POD0 s/p s/p L4-5 lumbar decompression, L L3-4 microdiscectomy.  He is cleared for discharge once he urinates, tolerates PO, and ambulates. He will follow up with me in two weeks in clinic.    Lonell Face, NP Department of Neurosurgery

## 2020-02-27 ENCOUNTER — Other Ambulatory Visit: Payer: Self-pay | Admitting: Oncology

## 2020-02-27 NOTE — Anesthesia Postprocedure Evaluation (Signed)
Anesthesia Post Note  Patient: BRYNDEN THUNE  Procedure(s) Performed: LEFT L3-4 MICRODISCECTOMY, L4-5 DECOMPRESSION (N/A )  Patient location during evaluation: PACU Anesthesia Type: General Level of consciousness: awake and alert Pain management: pain level controlled Vital Signs Assessment: post-procedure vital signs reviewed and stable Respiratory status: spontaneous breathing, nonlabored ventilation, respiratory function stable and patient connected to nasal cannula oxygen Cardiovascular status: blood pressure returned to baseline and stable Postop Assessment: no apparent nausea or vomiting Anesthetic complications: no   No complications documented.   Last Vitals:  Vitals:   02/24/20 1305 02/24/20 1353  BP: (!) 132/59 (!) 146/65  Pulse: 64 62  Resp: 16 16  Temp:  (!) 36.1 C  SpO2: 98% 97%    Last Pain:  Vitals:   02/24/20 1353  TempSrc: Temporal  PainSc: 2                  Molli Barrows

## 2020-02-28 ENCOUNTER — Other Ambulatory Visit: Payer: Self-pay | Admitting: Oncology

## 2020-03-05 ENCOUNTER — Other Ambulatory Visit: Payer: Self-pay | Admitting: Internal Medicine

## 2020-03-09 ENCOUNTER — Inpatient Hospital Stay: Payer: Medicare Other | Admitting: Oncology

## 2020-03-16 ENCOUNTER — Inpatient Hospital Stay: Payer: Medicare Other | Attending: Oncology | Admitting: Oncology

## 2020-03-16 ENCOUNTER — Other Ambulatory Visit: Payer: Self-pay

## 2020-03-16 ENCOUNTER — Encounter: Payer: Self-pay | Admitting: Oncology

## 2020-03-16 ENCOUNTER — Inpatient Hospital Stay: Payer: Medicare Other

## 2020-03-16 VITALS — BP 109/57 | HR 74 | Temp 98.3°F | Resp 16 | Wt 150.6 lb

## 2020-03-16 DIAGNOSIS — Z79899 Other long term (current) drug therapy: Secondary | ICD-10-CM | POA: Diagnosis not present

## 2020-03-16 DIAGNOSIS — Z9221 Personal history of antineoplastic chemotherapy: Secondary | ICD-10-CM | POA: Diagnosis not present

## 2020-03-16 DIAGNOSIS — I2510 Atherosclerotic heart disease of native coronary artery without angina pectoris: Secondary | ICD-10-CM | POA: Diagnosis not present

## 2020-03-16 DIAGNOSIS — C155 Malignant neoplasm of lower third of esophagus: Secondary | ICD-10-CM | POA: Insufficient documentation

## 2020-03-16 DIAGNOSIS — Z8249 Family history of ischemic heart disease and other diseases of the circulatory system: Secondary | ICD-10-CM | POA: Diagnosis not present

## 2020-03-16 DIAGNOSIS — Z7189 Other specified counseling: Secondary | ICD-10-CM | POA: Diagnosis not present

## 2020-03-16 DIAGNOSIS — Z801 Family history of malignant neoplasm of trachea, bronchus and lung: Secondary | ICD-10-CM | POA: Diagnosis not present

## 2020-03-16 DIAGNOSIS — C159 Malignant neoplasm of esophagus, unspecified: Secondary | ICD-10-CM

## 2020-03-16 DIAGNOSIS — Z803 Family history of malignant neoplasm of breast: Secondary | ICD-10-CM | POA: Insufficient documentation

## 2020-03-16 DIAGNOSIS — I10 Essential (primary) hypertension: Secondary | ICD-10-CM | POA: Insufficient documentation

## 2020-03-16 DIAGNOSIS — Z85828 Personal history of other malignant neoplasm of skin: Secondary | ICD-10-CM | POA: Diagnosis not present

## 2020-03-16 DIAGNOSIS — Z95828 Presence of other vascular implants and grafts: Secondary | ICD-10-CM

## 2020-03-16 DIAGNOSIS — Z87891 Personal history of nicotine dependence: Secondary | ICD-10-CM | POA: Diagnosis not present

## 2020-03-16 DIAGNOSIS — Z7984 Long term (current) use of oral hypoglycemic drugs: Secondary | ICD-10-CM | POA: Diagnosis not present

## 2020-03-16 DIAGNOSIS — E119 Type 2 diabetes mellitus without complications: Secondary | ICD-10-CM | POA: Diagnosis not present

## 2020-03-16 DIAGNOSIS — K219 Gastro-esophageal reflux disease without esophagitis: Secondary | ICD-10-CM | POA: Insufficient documentation

## 2020-03-16 DIAGNOSIS — Z923 Personal history of irradiation: Secondary | ICD-10-CM | POA: Insufficient documentation

## 2020-03-16 DIAGNOSIS — E785 Hyperlipidemia, unspecified: Secondary | ICD-10-CM | POA: Insufficient documentation

## 2020-03-16 DIAGNOSIS — Z833 Family history of diabetes mellitus: Secondary | ICD-10-CM | POA: Diagnosis not present

## 2020-03-16 MED ORDER — HEPARIN SOD (PORK) LOCK FLUSH 100 UNIT/ML IV SOLN
500.0000 [IU] | Freq: Once | INTRAVENOUS | Status: AC
  Administered 2020-03-16: 15:00:00 500 [IU] via INTRAVENOUS
  Filled 2020-03-16: qty 5

## 2020-03-16 MED ORDER — SODIUM CHLORIDE 0.9% FLUSH
10.0000 mL | INTRAVENOUS | Status: DC | PRN
  Administered 2020-03-16: 15:00:00 10 mL via INTRAVENOUS
  Filled 2020-03-16: qty 10

## 2020-03-16 NOTE — Progress Notes (Signed)
Hematology/Oncology note [facility] Telephone:([phone] [phone]   Patient Care Team: Idelle Crouch, MD as PCP - General (Internal Medicine) Christene Lye, MD (General Surgery) Clent Jacks, RN as Oncology Nurse Navigator  REFERRING PROVIDER: Idelle Crouch, MD  CHIEF COMPLAINTS/REASON FOR VISIT:  Follow up  esophageal cancer  HISTORY OF PRESENTING ILLNESS:   Blake [name] is a  74 y.o.  male with PMH listed below was seen in consultation at the request of  Idelle Crouch, MD  for evaluation of esophageal cancer Patient has longstanding Barrett's esophagus. 03/21/2019 patient underwent surveillance endoscopy which showed esophageal mucosal changes secondary to long segment Barrett's disease present in the lower third of the esophagus.  Maximal longitudinal extent of these mucosal changes were 7 cm in length.  Mucosa was biopsied with cold forceps for histology in 4 quadrants at the interval of 2 cm in the lower third of esophagus.  2 cm hiatal hernia, gastritis. 03/21/2019 colonoscopy showed diverticulosis in the sigmoid colon.  4 mm polyp in ascending colon was removed. 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  1 out of 4 esophagus biopsy showed Barrett's mucosa with erosion, indefinite for s. Colon polyp biopsy showed tubular adenoma. Patient was referred to cancer center for further evaluation and management.  04/06/2019 CT chest abdomen pelvis showed no evidence of metastatic disease in the chest abdomen or pelvis.  No esophageal mass lesion evident.  Small to moderate hiatal hernia with possible wall thickening proximal stomach.  3 mm left lower lobe pulmonary nodule attention on follow-up recommended to ensure stability.  Tiny hypodensities in the left kidney with attenuation too high to be simple cyst.  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 Jeb Levering.  His initial PET scan 11/08/2004 showed abnormal localization in a right neck mass possibly a lymph node, slightly asymmetric localization at the base of the tongue.  Pathology is not available in current EMR.   #06/09/19, EMR at [facility].  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 EMR specimen was received in multiple fragments and margins can not be evaluated.  1 gastric polyps were biopsied which showed fundic gland polyp. Negative for dysplasia  #08/08/19, to OR for EGD with sessile tumor from 29-33cm, at least 1.5 cm wide. Pathology with the following: 1) esophagus (30cm) - residual invasive adenocarcinoma, at least invasive to muscularis mucosa and 2) esophagus (30cm) - superficial fragments of highly dysplastic epithelium, focally suspicious for intramucosal carcinoma  #  09/12/2019, patient underwent esophagectomy with pathological revealed  Negative esophageal margin, gastric margin, esophagogastrectomy, invasive moderately differentiated esophageal adenocarcinoma involved distal esophageal and GE junction, tumor focally invades the submucosa.  Left gastric lymph node x 4 negative, greater curvature lymph nodes 1/3 is positive for 47m metastatic carcinoma.  Periesophageal lymph node x 6, negative, final pathology stage pT1b pn1, Esophageal adenocarcinoma, Stage IIB Lymph node positive diease, no neoadjuvant chemotherapy, recommended by  Dr. UFanny Skatesand was recommended for adjuvant chemotherapy.  Patient prefers to receive treatment closer to home and patient call back to establish care. No radiation was recommended due to the concern of dehiscence. 11/28/2019, patient had a stent removal.  Adjuvant chemotherapy therapy September 2021, patient underwent Xeloda 1500 mg twice daily/oxaliplatin.  He reports not tolerating well due to nausea  and diarrhea.  12/24/2019 patient's wife was diagnosed with COVID-19 infection and patient was tested negative.  Both patient and wife received monoclonal antibody treatments.  Patient prefers to switch to FOLFOX. He had Mediport placed on 01/12/2020 by Dr. Faith Rogue. He had x-ray prior and after this procedure.  Post x-ray showed extensive patchy consolidation throughout the right lung and a mild patchy opacity in the upper left lung.  Worsened.  Patient also reports having cough, as well as some chronic shortness of breath ever since his surgery at [facility] in June 2021. 01/17/2020, stat CT chest with contrast showed extensive nodular airspace process in the right lung with loss of volume and areas of subpleural atelectasis.  Likely polylobar pneumonia with aspiration.  No mediastinal or hilar mass or adenopathy.  Surgical changes Patient was advised to go to emergency room and was later admitted for pneumonia treatments.  There was concern of aspiration pneumonia.  Patient was evaluated by speech therapy.  No overt clinical signs of aspiration during p.o. intake.  Cannot rule out possibility of esophageal phase retrograde flow activity patient denies further swallowing evaluation.  Patient was discharged on 01/19/2020.   INTERVAL HISTORY Blake [name] is a 74 y.o. male who has above history reviewed by me today presents for follow up visit for management of esophageal cancer.  Problems and complaints are listed below: He was discharged from hospital on 01/19/2020.  Her office reached out to patient to reschedule his follow-up and chemotherapy appointment and the patient wanted to defer at that time as he is planning to have a back surgery done. 02/10/2020 patient was seen by [facility] gastroenterology.  Was recommended and no indication of repeat swallowing evaluation at present time. 02/24/2020, patient had L4-5 lumbar decompression, L3--4 microdiscectomy.  Per note he tolerates procedure very  well. Today patient reports no new complaints.  He continues to have chronic shortness of breath with exertion h.  And follows up with Dr. Doy Hutching.  Was last seen on 03/05/2020.  Dr. Doy Hutching ordered another chest x-ray and the result is not available to me. He is accompanied by his wife.  Patient has a plan to go vacation for 1 month in February and wants to resume rest of the chemotherapy in March 2022.  Review of Systems  Constitutional: Negative for appetite change, chills, diaphoresis, fatigue, fever and unexpected weight change.  HENT:   Negative for hearing loss, lump/mass, nosebleeds, sore throat and voice change.   Eyes: Negative for eye problems and icterus.  Respiratory: Positive for shortness of breath. Negative for chest tightness, cough, hemoptysis and wheezing.   Cardiovascular: Negative for chest pain and leg swelling.  Gastrointestinal: Negative for abdominal distention, abdominal pain, blood in stool, diarrhea, nausea and rectal pain.       Heartburn  Endocrine: Negative for hot flashes.  Genitourinary: Negative for bladder incontinence, difficulty urinating, dysuria, frequency, hematuria and nocturia.   Musculoskeletal: Negative for arthralgias, back pain, flank pain, gait problem and myalgias.  Skin: Negative for itching and rash.  Neurological: Negative for dizziness, gait problem, headaches, light-headedness, numbness and seizures.  Hematological: Negative for adenopathy. Does not bruise/bleed easily.  Psychiatric/Behavioral: Negative for confusion and decreased concentration. The patient is not nervous/anxious.     MEDICAL HISTORY:  Past Medical History:  Diagnosis Date  . Arthritis   . Back pain    with leg pain  . Cancer ([place]) 2021   esophageal  . Cancer ([place]) 2006   base of toungue  . Carotid artery stenosis without  cerebral infarction, bilateral   . Coronary artery disease   . Diabetes mellitus without complication ([place])   . Dyspnea   . Dysrhythmia   . GERD  (gastroesophageal reflux disease)   . Hyperlipidemia   . Hypertension   . Numbness and tingling of both lower extremities    with positioning  . Port-A-Cath in place 2006  . Prostate enlargement   . Skin cancer    several sites/ face cheek    SURGICAL HISTORY: Past Surgical History:  Procedure Laterality Date  . ABDOMINAL SURGERY    . COLONOSCOPY  12/08/2003  . COLONOSCOPY WITH PROPOFOL N/A 03/21/2019   Procedure: COLONOSCOPY WITH PROPOFOL;  Surgeon: Toledo, Benay Pike, MD;  Location: [facility] ENDOSCOPY;  Service: Gastroenterology;  Laterality: N/A;  . ESOPHAGOGASTRODUODENOSCOPY (EGD) WITH PROPOFOL N/A 03/21/2019   Procedure: ESOPHAGOGASTRODUODENOSCOPY (EGD) WITH PROPOFOL;  Surgeon: Toledo, Benay Pike, MD;  Location: [facility] ENDOSCOPY;  Service: Gastroenterology;  Laterality: N/A;  . esophogeal cancer    . JOINT REPLACEMENT Right    total hip  . LUMBAR LAMINECTOMY/DECOMPRESSION MICRODISCECTOMY N/A 03/03/2018   Procedure: LUMBAR LAMINECTOMY/DECOMPRESSION MICRODISCECTOMY 1 LEVEL-L3-4,L4-5;  Surgeon: Meade Maw, MD;  Location: [facility] ORS;  Service: Neurosurgery;  Laterality: N/A;  . LUMBAR LAMINECTOMY/DECOMPRESSION MICRODISCECTOMY N/A 02/24/2020   Procedure: LEFT L3-4 MICRODISCECTOMY, L4-5 DECOMPRESSION;  Surgeon: Meade Maw, MD;  Location: [facility] ORS;  Service: Neurosurgery;  Laterality: N/A;  . PARTIAL HIP ARTHROPLASTY Right 2014  . PORT-A-CATH REMOVAL    . PORTACATH PLACEMENT Left 01/12/2020   Procedure: INSERTION PORT-A-CATH, with ultrasound fluoroscopy;  Surgeon: Nestor Lewandowsky, MD;  Location: [facility] ORS;  Service: General;  Laterality: Left;  . TONGUE BIOPSY  2006  . TONSILLECTOMY    . TRIGGER FINGER RELEASE Right 2013  . UPPER GI ENDOSCOPY  01/25/14   multiple gastric polyps    SOCIAL HISTORY: Social History   Socioeconomic History  . Marital status: Married    Spouse name: Not on file  . Number of children: Not on file  . Years of education: Not on file  . Highest  education level: Not on file  Occupational History  . Not on file  Tobacco Use  . Smoking status: Former Smoker    Packs/day: 1.00    Years: 30.00    Pack years: 30.00    Types: Cigarettes    Quit date: 02/18/1994    Years since quitting: 26.0  . Smokeless tobacco: Never Used  Vaping Use  . Vaping Use: Never used  Substance and Sexual Activity  . Alcohol use: Not Currently  . Drug use: No  . Sexual activity: Yes  Other Topics Concern  . Not on file  Social History Narrative  . Not on file   Social Determinants of Health   Financial Resource Strain: Not on file  Food Insecurity: Not on file  Transportation Needs: Not on file  Physical Activity: Not on file  Stress: Not on file  Social Connections: Not on file  Intimate Partner Violence: Not on file    FAMILY HISTORY: Family History  Problem Relation Age of Onset  . Diabetes Mother   . Congestive Heart Failure Mother   . Breast cancer Mother   . Lung cancer Father     ALLERGIES:  is allergic to sulfa antibiotics.  MEDICATIONS:  Current Outpatient Medications  Medication Sig Dispense Refill  . amiodarone (PACERONE) 200 MG tablet Take 200 mg by mouth daily.    [name] Kitchen gabapentin (NEURONTIN) 300 MG capsule Take 300 mg by mouth 3 (three)  times daily as needed for pain.    . metFORMIN (GLUCOPHAGE) 500 MG tablet Take 500 mg by mouth 2 (two) times daily.     . methocarbamol (ROBAXIN) 500 MG tablet Take 1 tablet (500 mg total) by mouth every 6 (six) hours as needed for muscle spasms. 80 tablet 0  . metoprolol succinate (TOPROL-XL) 100 MG 24 hr tablet Take 50 mg by mouth daily.     [name] Kitchen omeprazole (PRILOSEC) 40 MG capsule Take 40 mg by mouth in the morning and at bedtime.  3  . promethazine (PHENERGAN) 25 MG tablet TAKE 1 TABLET BY MOUTH EVERY 6 HOURS AS NEEDED FOR NAUSEA OR VOMITING. 30 tablet 0  . tamsulosin (FLOMAX) 0.4 MG CAPS capsule Take 0.4 mg by mouth daily after supper.     [name] Kitchen amoxicillin-clavulanate (AUGMENTIN) 875-125 MG  tablet Take 1 tablet by mouth every 12 (twelve) hours. (Patient not taking: No sig reported) 14 tablet 0  . fludrocortisone (FLORINEF) 0.1 MG tablet Take 100 mcg by mouth daily.    [name] Kitchen lidocaine-prilocaine (EMLA) cream Apply to affected area once (Patient not taking: No sig reported) 30 g 3   No current facility-administered medications for this visit.     PHYSICAL EXAMINATION: ECOG PERFORMANCE STATUS: 1 - Symptomatic but completely ambulatory Vitals:   03/16/20 1359  BP: (!) 109/57  Pulse: 74  Resp: 16  Temp: 98.3 F (36.8 C)  SpO2: 95%   Filed Weights   03/16/20 1359  Weight: 150 lb 9.6 oz (68.3 kg)    Physical Exam Constitutional:      General: He is not in acute distress. HENT:     Head: Normocephalic and atraumatic.  Eyes:     General: No scleral icterus.    Pupils: Pupils are equal, round, and reactive to light.  Cardiovascular:     Rate and Rhythm: Normal rate and regular rhythm.     Heart sounds: Normal heart sounds.  Pulmonary:     Effort: Pulmonary effort is normal. No respiratory distress.     Breath sounds: No wheezing.  Abdominal:     General: Bowel sounds are normal. There is no distension.     Palpations: Abdomen is soft. There is no mass.     Tenderness: There is no abdominal tenderness.  Musculoskeletal:        General: No deformity. Normal range of motion.     Cervical back: Normal range of motion and neck supple.  Skin:    General: Skin is warm and dry.     Findings: No erythema or rash.  Neurological:     Mental Status: He is alert and oriented to person, place, and time. Mental status is at baseline.     Cranial Nerves: No cranial nerve deficit.     Coordination: Coordination normal.  Psychiatric:        Mood and Affect: Mood normal.        Behavior: Behavior normal.        Thought Content: Thought content normal.     LABORATORY DATA:  I have reviewed the data as listed Lab Results  Component Value Date   WBC 16.5 (H) 02/17/2020   HGB  12.8 (L) 02/17/2020   HCT 40.5 02/17/2020   MCV 81.8 02/17/2020   PLT 437 (H) 02/17/2020   Recent Labs    04/11/19 1230 12/19/19 0906 12/28/19 1328 01/17/20 0840  NA 138 139 137 135  K 4.5 4.4 4.1 4.4  CL 103 103 102 100  CO2  25 26 24 24   GLUCOSE 199* 227* 157* 175*  BUN 18 20 15 20   CREATININE 1.04 1.05 1.07 0.79  CALCIUM 8.8* 8.8* 8.6* 8.6*  GFRNONAA >60 >60 >60 >60  GFRAA >60 >60 >60  --   PROT 7.2 6.6 6.8 7.1  ALBUMIN 4.2 3.6 3.4* 2.9*  AST 23 17 18 23   ALT 37 15 16 24   ALKPHOS 91 64 56 72  BILITOT 0.6 0.6 0.5 0.5   Iron/TIBC/Ferritin/ %Sat No results found for: IRON, TIBC, FERRITIN, IRONPCTSAT    RADIOGRAPHIC STUDIES: I have personally reviewed the radiological images as listed and agreed with the findings in the report. Chest 2 View  Result Date: 01/11/2020 CLINICAL DATA:  Preoperative exam.  Esophageal cancer EXAM: CHEST - 2 VIEW COMPARISON:  04/06/2019 FINDINGS: Heart size is within normal limits. There is extensive patchy opacity throughout the right lung, some of which has a more nodular configuration. There is also a 9 mm rounded nodular density within the medial aspect of the left upper lobe. There may be a trace right pleural effusion. No pneumothorax. Degenerative changes of the shoulders and thoracic spine. IMPRESSION: 1. Extensive patchy opacity throughout the right lung, some of which has a more nodular configuration. Findings may represent multifocal pneumonia. Pulmonary metastatic disease is not excluded given the patient's history. Further evaluation with contrast-enhanced CT of the chest is recommended. 2. Additional nodular density noted within the left upper lobe. These results will be called to the ordering clinician or representative by the Radiologist Assistant, and communication documented in the PACS or [employer]. Electronically Signed   By: Davina Poke D.O.   On: 01/11/2020 16:33   DG Lumbar Spine 2-3 Views  Result Date:  02/24/2020 CLINICAL DATA:  74 year old male undergoing lumbar surgery. EXAM: LUMBAR SPINE - 2-3 VIEW; DG C-ARM 1-60 MIN COMPARISON:  Lumbar MRI 01/23/2020. CT Abdomen and Pelvis 04/06/2019. FINDINGS: Three intraoperative cross-table lateral fluoroscopic views of the lumbar spine. Normal lumbar segmentation demonstrated on prior studies. These images demonstrate initial posterior surgical probe localization at L4-L5 disc space level (overlying L4 inferior articulating facet). On the final image the surgical probe has been repositioned at the L3-L4 disc level (overlying the L3 inferior articulating facet). IMPRESSION: Intraoperative fluoroscopic images with localization at L3-L4 on the final image. Electronically Signed   By: Genevie Ann M.D.   On: 02/24/2020 09:58   CT Chest W Contrast  Result Date: 01/17/2020 CLINICAL DATA:  Follow-up abnormal chest x-ray. Patient with history of esophageal cancer. Status post gastric pull-through procedure and Port-A-Cath placement. EXAM: CT CHEST WITH CONTRAST TECHNIQUE: Multidetector CT imaging of the chest was performed during intravenous contrast administration. CONTRAST:  64mL OMNIPAQUE IOHEXOL 300 MG/ML  SOLN COMPARISON:  Chest CT 04/06/2019 FINDINGS: Cardiovascular: The heart is normal in size. No pericardial effusion. Mild tortuosity and calcification of the thoracic aorta is stable. No dissection. The branch vessels are patent. Stable scattered coronary artery calcifications. Mediastinum/Nodes: Small scattered mediastinal and hilar lymph nodes but no mass or overt adenopathy. These are likely reactive and related to the right lung process. Surgical changes related to a gastric pull-through procedure. No complicating features are identified. Lungs/Pleura: Extensive nodular airspace process in the right lung with loss of volume and areas of subpleural atelectasis. This involves the right upper lobe, right middle lobe and right lower lobe. Aspiration would be a  consideration. The left lung is clear. No infiltrates, edema or pulmonary nodules. No left-sided pleural effusion. Upper Abdomen: No significant upper abdominal  findings. Musculoskeletal: No chest wall mass, supraclavicular or axillary adenopathy. The thyroid gland is unremarkable. There is a left-sided Port-A-Cath noted in good position without complicating features. No significant bony findings. IMPRESSION: 1. Extensive nodular airspace process in the right lung with loss of volume and areas of subpleural atelectasis. Polylobar pneumonia with aspiration a consideration. 2. Surgical changes related to a gastric pull-through procedure. No complicating features are identified. 3. No mediastinal or hilar mass or adenopathy. 4. Stable atherosclerotic calcifications involving the aorta and coronary arteries. 5. Aortic atherosclerosis. These results will be called to the ordering clinician or representative by the Radiologist Assistant, and communication documented in the PACS or [employer]. Aortic Atherosclerosis (ICD10-I70.0). Electronically Signed   By: Marijo Sanes M.D.   On: 01/17/2020 11:38   MR LUMBAR SPINE WO CONTRAST  Result Date: 01/23/2020 CLINICAL DATA:  Low back pain. Lumbosacral osteoarthritis. Currently receiving treatment for throat and tongue cancer. Prior laminectomy 5 years ago. Bilateral leg pain. EXAM: MRI LUMBAR SPINE WITHOUT CONTRAST TECHNIQUE: Multiplanar, multisequence MR imaging of the lumbar spine was performed. No intravenous contrast was administered. COMPARISON:  Lumbar MRI 04/02/2017 FINDINGS: Segmentation:  Normal.  Lowest fully developed disc space L5-S1. Alignment: 4 mm retrolisthesis L4-5 unchanged. 3 mm retrolisthesis L5-S1 unchanged. Vertebrae: Negative for fracture or mass. Numerous hemangiomata throughout the lumbar spine including T12, L1, L2, L3, and L4 vertebral bodies. No interval change. Conus medullaris and cauda equina: Conus extends to the L2 level. Conus and  cauda equina appear normal. Paraspinal and other soft tissues: Negative for paraspinous mass or adenopathy. Abdominal aorta normal in caliber. Disc levels: T12-L1: Disc degeneration with small right-sided disc protrusion and endplate spurring on the right. Mild right foraminal stenosis. No interval change. L1-2: Mild disc degeneration with diffuse disc bulging and mild endplate spurring. No significant stenosis. L2-3: Diffuse disc bulging and diffuse endplate spurring. Spurring is more prominent on the right than the left. Mild facet hypertrophy bilaterally. Mild spinal stenosis. Moderate subarticular stenosis bilaterally unchanged from prior study L3-4: Moderately large extruded disc fragment on the left is new since the prior study. There is upgoing disc material with left foraminal encroachment and left L3 nerve root compression. Left L4 nerve root compression in the subarticular zone due to disc protrusion and spurring. Chronic disc degeneration and disc bulging with endplate spurring is again noted. Bilateral facet hypertrophy. Moderate spinal stenosis. Severe subarticular and foraminal stenosis on the left. Moderate subarticular and foraminal stenosis on the right. L4-5: Advanced disc degeneration which is asymmetric on the left. Disc space narrowing and spurring is present left greater than right. Bilateral facet degeneration. Mild spinal stenosis appears slightly improved from the prior study. Severe subarticular and foraminal stenosis on the left is unchanged. Mild subarticular stenosis on the right L5-S1: Moderate to advanced disc degeneration with diffuse endplate spurring. Bilateral facet hypertrophy. Moderate subarticular and foraminal stenosis bilaterally left greater than right is unchanged from the prior study. IMPRESSION: Multilevel degenerative change throughout the lumbar spine causing multilevel spinal and foraminal stenosis as above. There is a moderately large extruded disc fragment on the left  at L3-4. Fragment extends superiorly with severe left foraminal encroachment and impingement left L3 and L4 nerve roots. Electronically Signed   By: Franchot Gallo M.D.   On: 01/23/2020 16:26   DG Chest Port 1 View  Result Date: 01/12/2020 CLINICAL DATA:  Port insertion, 7 2 cancer EXAM: PORTABLE CHEST 1 VIEW COMPARISON:  01/10/2020 chest radiograph. FINDINGS: Left subclavian Port-A-Cath terminates in lower third  of the SVC. Stable cardiomediastinal silhouette with normal heart size. No pneumothorax. No pleural effusion. Extensive patchy consolidation throughout the right lung, worsened. Mild patchy opacity in the upper left lung, slightly worsened. IMPRESSION: 1. No pneumothorax.  Well-positioned left subclavian Port-A-Cath. 2. Extensive patchy consolidation throughout the right lung and mild patchy opacity in the upper left lung, worsened, compatible with worsening multifocal pneumonia. Electronically Signed   By: Ilona Sorrel M.D.   On: 01/12/2020 09:34   DG C-Arm 1-60 Min  Result Date: 02/24/2020 CLINICAL DATA:  74 year old male undergoing lumbar surgery. EXAM: LUMBAR SPINE - 2-3 VIEW; DG C-ARM 1-60 MIN COMPARISON:  Lumbar MRI 01/23/2020. CT Abdomen and Pelvis 04/06/2019. FINDINGS: Three intraoperative cross-table lateral fluoroscopic views of the lumbar spine. Normal lumbar segmentation demonstrated on prior studies. These images demonstrate initial posterior surgical probe localization at L4-L5 disc space level (overlying L4 inferior articulating facet). On the final image the surgical probe has been repositioned at the L3-L4 disc level (overlying the L3 inferior articulating facet). IMPRESSION: Intraoperative fluoroscopic images with localization at L3-L4 on the final image. Electronically Signed   By: Genevie Ann M.D.   On: 02/24/2020 09:58   DG C-Arm 1-60 Min-No Report  Result Date: 01/12/2020 Fluoroscopy was utilized by the requesting physician.  No radiographic interpretation.        ASSESSMENT & PLAN:  1. 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[place])   2. Goals of care, counseling/discussion   3. Port-A-Cath in place   Cancer Staging Malignant neoplasm of lower third of esophagus ([place]) Staging form: Esophagus - Adenocarcinoma, AJCC 8th Edition - Clinical: No stage assigned - Unsigned - Pathologic stage from 12/09/2019: Stage IIB (pT1b, pN1, cM0, G2) - Signed by Earlie Server, MD on 12/09/2019   #Esophageal adenocarcinoma, Stage IIB Patient is status post 1 cycle of Xeloda plus oxaliplatin treatment.  He did not tolerate well due to GI side effects and the original plan is to switch to FOLFOX.  Additional chemotherapy was interrupted and delayed due to his aspiration pneumonia, and later due to his preference of proceeding with back surgery first.  Patient also reports that he may need to have a knee surgery in the near future He also has planned for 1 month in February 2022.   I recommend to repeat a PET scan for restaging and surveillance. Unfortunately his adjuvant chemotherapy has been delayed significantly due to above reasons. The benefit is uncertain to resume adjuvant chemotherapy 9 months after original surgery. If PET scan shows no metastatic disease.  I recommend observation/surveillance.  He agrees with the plan.  I recommend him to also update his [employer]. #Port-A-Cath in place, continue port flush every 8 weeks All questions were answered. The patient knows to call the clinic with any problems questions or concerns.  cc Idelle Crouch, MD  Follow-up to be determined  we spent sufficient time to discuss many aspect of care, questions were answered to patient's satisfaction.   Earlie Server, MD, PhD Hematology Oncology [facility] Pager- [phone] 03/16/2020

## 2020-03-16 NOTE — Progress Notes (Signed)
Patient had back surgery 3 weeks ago.

## 2020-03-19 ENCOUNTER — Telehealth: Payer: Self-pay

## 2020-03-19 NOTE — Telephone Encounter (Signed)
Nutrition  Called patient for nutrition follow-up.  No answer. Left message with call back number  Bailley [place] B. Kazi Montoro, RD, LDN Registered Dietitian [phone] (mobile)  

## 2020-04-02 ENCOUNTER — Other Ambulatory Visit: Payer: Self-pay | Admitting: Internal Medicine

## 2020-04-10 ENCOUNTER — Ambulatory Visit
Admission: RE | Admit: 2020-04-10 | Discharge: 2020-04-10 | Disposition: A | Discharge status: Home / Self Care | Payer: Medicare Other | Source: Ambulatory Visit | Attending: Oncology | Admitting: Oncology

## 2020-04-10 ENCOUNTER — Other Ambulatory Visit: Payer: Self-pay

## 2020-04-10 DIAGNOSIS — J439 Emphysema, unspecified: Secondary | ICD-10-CM | POA: Diagnosis not present

## 2020-04-10 DIAGNOSIS — N200 Calculus of kidney: Secondary | ICD-10-CM | POA: Insufficient documentation

## 2020-04-10 DIAGNOSIS — C159 Malignant neoplasm of esophagus, unspecified: Secondary | ICD-10-CM | POA: Insufficient documentation

## 2020-04-10 DIAGNOSIS — I700 Atherosclerosis of aorta: Secondary | ICD-10-CM | POA: Insufficient documentation

## 2020-04-10 DIAGNOSIS — J90 Pleural effusion, not elsewhere classified: Secondary | ICD-10-CM | POA: Diagnosis not present

## 2020-04-10 DIAGNOSIS — I2510 Atherosclerotic heart disease of native coronary artery without angina pectoris: Secondary | ICD-10-CM | POA: Insufficient documentation

## 2020-04-10 LAB — GLUCOSE, CAPILLARY: Glucose-Capillary: 147 mg/dL — ABNORMAL HIGH (ref 70–99)

## 2020-04-10 MED ORDER — FLUDEOXYGLUCOSE F - 18 (FDG) INJECTION
7.8000 | Freq: Once | INTRAVENOUS | Status: AC | PRN
  Administered 2020-04-10: 8.546 via INTRAVENOUS

## 2020-04-16 ENCOUNTER — Telehealth: Payer: Self-pay

## 2020-04-16 DIAGNOSIS — C159 Malignant neoplasm of esophagus, unspecified: Secondary | ICD-10-CM

## 2020-04-16 NOTE — Telephone Encounter (Signed)
Spoke to pt, he states that he only has shortness of breath, no other symptoms, but he has had this since he got out of [employer]. Pt also reports that he will have PCP (Dr. Doy Hutching) look at PET and further advise. Pt notified of follow up plan.

## 2020-04-16 NOTE — Telephone Encounter (Signed)
Done..  Appts were sched as requested. A detailed message was left on pts VM making him aware of the sched appts location, date, time, NPO 4hrs prior And to pick up prep kit before the sched 07/12/20 CT scan

## 2020-04-16 NOTE — Telephone Encounter (Signed)
-----   Message from Earlie Server, MD sent at 04/14/2020  7:24 PM EST ----- Please let him know that PET scan did not show any evidence of cancer recurrence.  There is changes in his lung that may be due to inflammation, aspiration. Please ask he has recently experienced any upper respiratory symptoms. Recommend him to follow up with me in 3 months, repeat lab cbc cmp cea, repeat CT chest abdomen pelvis w contrast - reason is esophageal cancer. And then see me. Thanks.

## 2020-04-17 ENCOUNTER — Other Ambulatory Visit: Payer: Self-pay | Admitting: Internal Medicine

## 2020-04-23 ENCOUNTER — Ambulatory Visit (INDEPENDENT_AMBULATORY_CARE_PROVIDER_SITE_OTHER): Payer: Medicare Other | Admitting: Dermatology

## 2020-04-23 ENCOUNTER — Encounter: Payer: Self-pay | Admitting: Dermatology

## 2020-04-23 ENCOUNTER — Other Ambulatory Visit: Payer: Self-pay

## 2020-04-23 ENCOUNTER — Other Ambulatory Visit: Payer: Self-pay | Admitting: Internal Medicine

## 2020-04-23 DIAGNOSIS — L578 Other skin changes due to chronic exposure to nonionizing radiation: Secondary | ICD-10-CM | POA: Diagnosis not present

## 2020-04-23 DIAGNOSIS — Z872 Personal history of diseases of the skin and subcutaneous tissue: Secondary | ICD-10-CM | POA: Diagnosis not present

## 2020-04-23 DIAGNOSIS — I6523 Occlusion and stenosis of bilateral carotid arteries: Secondary | ICD-10-CM | POA: Diagnosis not present

## 2020-04-23 DIAGNOSIS — Z85828 Personal history of other malignant neoplasm of skin: Secondary | ICD-10-CM | POA: Diagnosis not present

## 2020-04-23 DIAGNOSIS — D692 Other nonthrombocytopenic purpura: Secondary | ICD-10-CM

## 2020-04-23 DIAGNOSIS — L821 Other seborrheic keratosis: Secondary | ICD-10-CM | POA: Diagnosis not present

## 2020-04-23 NOTE — Progress Notes (Unsigned)
   Follow-Up Visit   Subjective  Blake [name] is a 75 y.o. male who presents for the following: Actinic Keratosis (Face and ears - check for any new or persist lesions).  The following portions of the chart were reviewed this encounter and updated as appropriate:   Tobacco  Allergies  Meds  Problems  Med Hx  Surg Hx  Fam Hx     Review of Systems:  No other skin or systemic complaints except as noted in HPI or Assessment and Plan.  Objective  Well appearing patient in no apparent distress; mood and affect are within normal limits.  A focused examination was performed including face, trunk, extremities. Relevant physical exam findings are noted in the Assessment and Plan.  Objective  Face and ears: Clear.    Assessment & Plan  History of actinic keratoses Face and ears  Clear. Observe for recurrence. Call clinic for new or changing lesions.  Recommend regular skin exams, daily broad-spectrum spf 30+ sunscreen use, and photoprotection.      Actinic Damage - chronic, secondary to cumulative UV radiation exposure/sun exposure over time - diffuse scaly erythematous macules with underlying dyspigmentation - Recommend daily broad spectrum sunscreen SPF 30+ to sun-exposed areas, reapply every 2 hours as needed.  - Call for new or changing lesions.  Seborrheic Keratoses - Stuck-on, waxy, tan-brown papules and plaques  - Discussed benign etiology and prognosis. - Observe - Call for any changes  History of Basal Cell Carcinoma of the Skin - No evidence of recurrence today - Recommend regular full body skin exams - Recommend daily broad spectrum sunscreen SPF 30+ to sun-exposed areas, reapply every 2 hours as needed.  - Call if any new or changing lesions are noted between office visits  History of Squamous Cell Carcinoma of the Skin - No evidence of recurrence today - No lymphadenopathy - Recommend regular full body skin exams - Recommend daily broad spectrum sunscreen  SPF 30+ to sun-exposed areas, reapply every 2 hours as needed.  - Call if any new or changing lesions are noted between office visits  Purpura - Chronic; persistent and recurrent.  Treatable, but not curable. - Violaceous macules and patches - Benign - Related to trauma, age, sun damage and/or use of blood thinners, chronic use of topical and/or oral steroids - Observe - Can use OTC arnica containing moisturizer such as Dermend Bruise Formula if desired - Call for worsening or other concerns  Return in about 6 months (around 10/21/2020) for TBSE.  Luther Redo, CMA, am acting as scribe for Sarina Ser, MD .  Documentation: I have reviewed the above documentation for accuracy and completeness, and I agree with the above.  Sarina Ser, MD

## 2020-04-24 ENCOUNTER — Encounter: Payer: Self-pay | Admitting: Dermatology

## 2020-04-24 ENCOUNTER — Ambulatory Visit (INDEPENDENT_AMBULATORY_CARE_PROVIDER_SITE_OTHER): Payer: Medicare Other

## 2020-04-24 ENCOUNTER — Ambulatory Visit (INDEPENDENT_AMBULATORY_CARE_PROVIDER_SITE_OTHER): Payer: Medicare Other | Admitting: Vascular Surgery

## 2020-04-24 ENCOUNTER — Other Ambulatory Visit: Payer: Self-pay

## 2020-04-24 ENCOUNTER — Encounter (INDEPENDENT_AMBULATORY_CARE_PROVIDER_SITE_OTHER): Payer: Self-pay | Admitting: Vascular Surgery

## 2020-04-24 VITALS — BP 164/78 | HR 71 | Resp 16 | Wt 149.0 lb

## 2020-04-24 DIAGNOSIS — I6523 Occlusion and stenosis of bilateral carotid arteries: Secondary | ICD-10-CM | POA: Diagnosis not present

## 2020-04-24 DIAGNOSIS — I10 Essential (primary) hypertension: Secondary | ICD-10-CM | POA: Diagnosis not present

## 2020-04-24 DIAGNOSIS — E785 Hyperlipidemia, unspecified: Secondary | ICD-10-CM

## 2020-04-24 DIAGNOSIS — E119 Type 2 diabetes mellitus without complications: Secondary | ICD-10-CM | POA: Diagnosis not present

## 2020-04-24 DIAGNOSIS — C155 Malignant neoplasm of lower third of esophagus: Secondary | ICD-10-CM

## 2020-04-24 NOTE — Progress Notes (Signed)
MRN : [id number]  Blake [name] is a 75 y.o. (01/18/1946) male who presents with chief complaint of  Chief Complaint  Patient presents with  . Carotid    5 month u/s follow up  .  History of Present Illness: Patient returns in follow-up of his carotid disease.  Since his last visit, he has undergone esophagectomy for esophageal cancer.  He has lost 43 pounds with this.  He has had a somewhat difficult prolonged recovery but that is not necessarily surprising for such a major surgery.  No focal neurologic symptoms. Specifically, the patient denies amaurosis fugax, speech or swallowing difficulties, or arm or leg weakness or numbness.  Carotid duplex reveals some progression of his right carotid stenosis now into the 60 to 79% range and stable carotid stenosis on the left and 60 to 79% range as well.  Current Outpatient Medications  Medication Sig Dispense Refill  . amiodarone (PACERONE) 200 MG tablet Take 200 mg by mouth daily.    . fludrocortisone (FLORINEF) 0.1 MG tablet Take 100 mcg by mouth daily.    [name] Kitchen gabapentin (NEURONTIN) 300 MG capsule Take 300 mg by mouth 3 (three) times daily as needed for pain.    [name] Kitchen HYDROcodone-acetaminophen (NORCO/VICODIN) 5-325 MG tablet Take by mouth.    . lidocaine-prilocaine (EMLA) cream Apply to affected area once 30 g 3  . metFORMIN (GLUCOPHAGE) 500 MG tablet Take 500 mg by mouth 2 (two) times daily.     . methocarbamol (ROBAXIN) 500 MG tablet Take 1 tablet (500 mg total) by mouth every 6 (six) hours as needed for muscle spasms. 80 tablet 0  . metoprolol succinate (TOPROL-XL) 100 MG 24 hr tablet Take 50 mg by mouth daily.     . nabumetone (RELAFEN) 500 MG tablet Take 500 mg by mouth 2 (two) times daily.    [name] Kitchen omeprazole (PRILOSEC) 40 MG capsule Take 40 mg by mouth in the morning and at bedtime.  3  . promethazine (PHENERGAN) 25 MG tablet TAKE 1 TABLET BY MOUTH EVERY 6 HOURS AS NEEDED FOR NAUSEA OR VOMITING. 30 tablet 0  . tamsulosin (FLOMAX) 0.4 MG CAPS  capsule Take 0.4 mg by mouth daily after supper.     [name] Kitchen amoxicillin-clavulanate (AUGMENTIN) 875-125 MG tablet Take 1 tablet by mouth every 12 (twelve) hours. (Patient not taking: No sig reported) 14 tablet 0   No current facility-administered medications for this visit.    Past Medical History:  Diagnosis Date  . Arthritis   . Back pain    with leg pain  . Basal cell carcinoma 09/04/2009   R cheek 5.5 cm ant to earlobe - 12/26/2009 excision  . Basal cell carcinoma 09/04/2009   R ant nasal alar rim  . Basal cell carcinoma 07/31/2014   R mid brow  . Basal cell carcinoma 07/30/2016   R ant nasal alar rim at ant edge of BCC scar  . Basal cell carcinoma 11/02/2019   L zygoma   . Cancer ([place]) 2021   esophageal  . 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 Coronary artery disease   . Diabetes mellitus without complication ([place])   . Dyspnea   . Dysrhythmia   . GERD (gastroesophageal reflux disease)   . Hyperlipidemia   . Hypertension   . Numbness and tingling of both lower extremities    with positioning  . Port-A-Cath in place 2006  . Prostate enlargement   .  Skin cancer    several sites/ face cheek  . Squamous cell carcinoma of skin 12/14/2019   L pretibial - ED&C     Past Surgical History:  Procedure Laterality Date  . ABDOMINAL SURGERY    . COLONOSCOPY  12/08/2003  . COLONOSCOPY WITH PROPOFOL N/A 03/21/2019   Procedure: COLONOSCOPY WITH PROPOFOL;  Surgeon: Toledo, Benay Pike, MD;  Location: [facility];  Service: Gastroenterology;  Laterality: N/A;  . ESOPHAGOGASTRODUODENOSCOPY (EGD) WITH PROPOFOL N/A 03/21/2019   Procedure: ESOPHAGOGASTRODUODENOSCOPY (EGD) WITH PROPOFOL;  Surgeon: Toledo, Benay Pike, MD;  Location: [facility];  Service: Gastroenterology;  Laterality: N/A;  . esophogeal cancer    . JOINT REPLACEMENT Right    total hip  . LUMBAR LAMINECTOMY/DECOMPRESSION MICRODISCECTOMY N/A 03/03/2018    Procedure: LUMBAR LAMINECTOMY/DECOMPRESSION MICRODISCECTOMY 1 LEVEL-L3-4,L4-5;  Surgeon: Meade Maw, MD;  Location: [facility] OR[facility];  Service: Neurosurgery;  Laterality: N/A;  . LUMBAR LAMINECTOMY/DECOMPRESSION MICRODISCECTOMY N/A 02/24/2020   Procedure: LEFT L3-4 MICRODISCECTOMY, L4-5 DECOMPRESSION;  Surgeon: Meade Maw, MD;  Location: [facility] OR[facility];  Service: Neurosurgery;  Laterality: N/A;  . PARTIAL HIP ARTHROPLASTY Right 2014  . PORT-A-CATH REMOVAL    . PORTACATH PLACEMENT Left 01/12/2020   Procedure: INSERTION PORT-A-CATH, with ultrasound fluoroscopy;  Surgeon: Nestor Lewandowsky, MD;  Location: [facility];  Service: General;  Laterality: Left;  . TONGUE BIOPSY  2006  . TONSILLECTOMY    . TRIGGER FINGER RELEASE Right 2013  . UPPER GI ENDOSCOPY  01/25/14   multiple gastric polyps     Social History   Tobacco Use  . Smoking status: Former Smoker    Packs/day: 1.00    Years: 30.00    Pack years: 30.00    Types: Cigarettes    Quit date: 02/18/1994    Years since quitting: 26.1  . Smokeless tobacco: Never Used  Vaping Use  . Vaping Use: Never used  Substance Use Topics  . Alcohol use: Not Currently  . Drug use: No      Family History  Problem Relation Age of Onset  . Diabetes Mother   . Congestive Heart Failure Mother   . Breast cancer Mother   . Lung cancer Father     Allergies  Allergen Reactions  . Sulfa Antibiotics Rash    REVIEW OF SYSTEMS (Negative unless checked)  Constitutional: [x] ?Weight loss  [] ?Fever  [] ?Chills Cardiac: [] ?Chest pain   [] ?Chest pressure   [] ?Palpitations   [] ?Shortness of breath when laying flat   [] ?Shortness of breath at rest   [] ?Shortness of breath with exertion. Vascular:  [] ?Pain in legs with walking   [] ?Pain in legs at rest   [] ?Pain in legs when laying flat   [] ?Claudication   [] ?Pain in feet when walking  [] ?Pain in feet at rest  [] ?Pain in feet when laying flat   [] ?History of DVT   [] ?Phlebitis   [] ?Swelling in legs    [] ?Varicose veins   [] ?Non-healing ulcers Pulmonary:   [] ?Uses home oxygen   [] ?Productive cough   [] ?Hemoptysis   [] ?Wheeze  [] ?COPD   [] ?Asthma Neurologic:  [] ?Dizziness  [] ?Blackouts   [] ?Seizures   [] ?History of stroke   [] ?History of TIA  [] ?Aphasia   [] ?Temporary blindness   [] ?Dysphagia   [] ?Weakness or numbness in arms   [] ?Weakness or numbness in legs Musculoskeletal:  [x] ?Arthritis   [] ?Joint swelling   [x] ?Joint pain   [] ?Low back pain Hematologic:  [] ?Easy bruising  [] ?Easy bleeding   [] ?Hypercoagulable state   [] ?Anemic  [] ?Hepatitis Gastrointestinal:  [] ?Blood in stool   [] ?  Vomiting blood  [x] ?Gastroesophageal reflux/heartburn   [x] ?Difficulty swallowing. Genitourinary:  [] ?Chronic kidney disease   [] ?Difficult urination  [] ?Frequent urination  [] ?Burning with urination   [] ?Blood in urine Skin:  [] ?Rashes   [] ?Ulcers   [] ?Wounds Psychological:  [] ?History of anxiety   [] ? History of major depression.   Physical Examination  Vitals:   04/24/20 1545  BP: (!) 164/78  Pulse: 71  Resp: 16  Weight: 149 lb (67.6 kg)   Body mass index is 24.79 kg/m. Gen:  WD/WN, NAD Head: [place]/AT, No temporalis wasting. Ear/Nose/Throat: Hearing grossly intact, nares w/o erythema or drainage, trachea midline Eyes: Conjunctiva clear. Sclera non-icteric Neck: Supple.  Bilateral carotid bruits  Pulmonary:  Good air movement, equal and clear to auscultation bilaterally.  Cardiac: RRR, No JVD Vascular:  Vessel Right Left  Radial Palpable Palpable       Musculoskeletal: M/S 5/5 throughout.  No deformity or atrophy. No edema. Neurologic: CN 2-12 intact. Sensation grossly intact in extremities.  Symmetrical.  Speech is fluent. Motor exam as listed above. Psychiatric: Judgment intact, Mood & affect appropriate for pt's clinical situation. Dermatologic: No rashes or ulcers noted.  No cellulitis or open wounds. Lymph : No Cervical, Axillary, or Inguinal lymphadenopathy.     CBC Lab Results   Component Value Date   WBC 16.5 (H) 02/17/2020   HGB 12.8 (L) 02/17/2020   HCT 40.5 02/17/2020   MCV 81.8 02/17/2020   PLT 437 (H) 02/17/2020    BMET    Component Value Date/Time   NA 135 01/17/2020 0840   NA 130 (L) 01/26/2013 0505   K 4.4 01/17/2020 0840   K 4.6 01/26/2013 0505   CL 100 01/17/2020 0840   CL 101 01/26/2013 0505   CO2 24 01/17/2020 0840   CO2 26 01/26/2013 0505   GLUCOSE 175 (H) 01/17/2020 0840   GLUCOSE 138 (H) 01/26/2013 0505   BUN 20 01/17/2020 0840   BUN 19 (H) 01/26/2013 0505   CREATININE 0.79 01/17/2020 0840   CREATININE 1.18 01/26/2013 0505   CALCIUM 8.6 (L) 01/17/2020 0840   CALCIUM 8.3 (L) 01/26/2013 0505   GFRNONAA >60 01/17/2020 0840   GFRNONAA >60 01/26/2013 0505   GFRAA >60 12/28/2019 1328   GFRAA >60 01/26/2013 0505   CrCl cannot be calculated (Patient's most recent lab result is older than the maximum 21 days allowed.).  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 Esophagogastrectomy 09/12/2019. EXAM: NUCLEAR MEDICINE PET SKULL BASE TO THIGH TECHNIQUE: 8.5 mCi F-18 FDG was injected intravenously. Full-ring PET imaging was performed from the skull base to thigh after the radiotracer. CT data was obtained and used for attenuation correction and anatomic localization. Fasting blood glucose: One hundred forty-seven mg/dl COMPARISON:  04/13/2019 PET.  CT chest CT 01/17/2020 FINDINGS: Mediastinal blood pool activity: SUV max 2.2 Liver activity: SUV max NA NECK: No areas of abnormal hypermetabolism. Incidental CT findings: Bilateral carotid atherosclerosis. CHEST: Hypermetabolism corresponding to relatively diffuse right lung areas of consolidation and clustered nodularity. Aeration is overall worsened compared to  01/17/2020. Examples an area of anterior right lower lobe dense consolidation including at 5.1 cm and a S.U.V. max of 10.9 on 125/3. Superior segment right lower lobe peripheral consolidation including at a S.U.V. max of 9.8 on  95/3. No thoracic nodal hypermetabolism. Incidental CT findings: Centrilobular emphysema. Partial esophagectomy and gastric pull-through. Left Port-A-Cath tip low SVC. Tiny bilateral pleural effusions. Aortic and coronary artery atherosclerosis. ABDOMEN/PELVIS: No abdominopelvic parenchymal or nodal hypermetabolism. Incidental CT findings: Normal adrenal glands. Left greater than right renal cortical thinning. Punctate upper pole left renal collecting system calculus. Suspect dependent gallstones. Abdominal aortic atherosclerosis. Degraded evaluation of the pelvis, secondary to beam hardening artifact from right hip arthroplasty. Large amount of stool within the rectum. Tiny bilateral fat containing inguinal hernias. SKELETON: No abnormal marrow activity. Incidental CT findings: Degenerative partial fusion of both sacroiliac joints. Presumed bone island in the left iliac. IMPRESSION: 1. Status post partial esophagectomy and gastric pull-through. No typical findings of hypermetabolic metastatic disease. 2. Since the CT of 01/17/2020, progressive right lung areas of dense consolidation and clustered nodularity with correlate hypermetabolism. Findings are again favored to represent aspiration or chronic infection. Neoplasm (primary bronchogenic carcinoma/carcinomas or metastatic disease) are felt highly unlikely, especially given normal appearance of the lungs on 04/06/2019. Consider chest CT follow-up at 3 months. 3. Tiny bilateral pleural effusions. 4. Aortic atherosclerosis (ICD10-I70.0), coronary artery atherosclerosis and emphysema (ICD10-J43.9). 5. Left nephrolithiasis. 6. Probable cholelithiasis. Electronically Signed   By: Abigail Miyamoto M.D.   On: 04/10/2020 16:20      Assessment/Plan HLD (hyperlipidemia) lipid control important in reducing the progression of atherosclerotic disease. Continue statin therapy   Essential (primary) hypertension blood pressure control important in reducing the progression of atherosclerotic disease. On appropriate oral medications.  Diabetes mellitus ([place]) blood glucose control important in reducing the progression of atherosclerotic disease. Also, involved in wound healing. On appropriate medications.  Malignant neoplasm of lower third of esophagus (HCC) S/p esophagectomy  Carotid stenosis  Carotid duplex reveals some progression of his right carotid stenosis now into the 60 to 79% range and stable carotid stenosis on the left and 60 to 79% range as well.  No role for surgery or intervention at this level but we will continue to monitor this on relatively close interval follow-up 6 months.    Leotis Pain, MD  04/24/2020 4:11 PM    This note was created with Dragon medical transcription system.  Any errors from dictation are purely unintentional

## 2020-04-24 NOTE — Assessment & Plan Note (Signed)
Carotid duplex reveals some progression of his right carotid stenosis now into the 60 to 79% range and stable carotid stenosis on the left and 60 to 79% range as well.  No role for surgery or intervention at this level but we will continue to monitor this on relatively close interval follow-up 6 months.

## 2020-04-24 NOTE — Assessment & Plan Note (Signed)
S/p esophagectomy

## 2020-04-30 ENCOUNTER — Inpatient Hospital Stay: Payer: Medicare Other | Attending: Oncology

## 2020-04-30 ENCOUNTER — Other Ambulatory Visit: Payer: Self-pay

## 2020-04-30 DIAGNOSIS — Z95828 Presence of other vascular implants and grafts: Secondary | ICD-10-CM

## 2020-04-30 DIAGNOSIS — C155 Malignant neoplasm of lower third of esophagus: Secondary | ICD-10-CM | POA: Diagnosis present

## 2020-04-30 DIAGNOSIS — Z452 Encounter for adjustment and management of vascular access device: Secondary | ICD-10-CM | POA: Diagnosis not present

## 2020-04-30 MED ORDER — SODIUM CHLORIDE 0.9% FLUSH
10.0000 mL | INTRAVENOUS | Status: DC | PRN
  Administered 2020-04-30: 10 mL via INTRAVENOUS
  Filled 2020-04-30: qty 10

## 2020-04-30 MED ORDER — HEPARIN SOD (PORK) LOCK FLUSH 100 UNIT/ML IV SOLN
500.0000 [IU] | Freq: Once | INTRAVENOUS | Status: AC
  Administered 2020-04-30: 500 [IU] via INTRAVENOUS
  Filled 2020-04-30: qty 5

## 2020-04-30 MED ORDER — HEPARIN SOD (PORK) LOCK FLUSH 100 UNIT/ML IV SOLN
INTRAVENOUS | Status: AC
  Filled 2020-04-30: qty 5

## 2020-05-30 ENCOUNTER — Other Ambulatory Visit (HOSPITAL_BASED_OUTPATIENT_CLINIC_OR_DEPARTMENT_OTHER): Payer: Self-pay

## 2020-05-31 ENCOUNTER — Other Ambulatory Visit: Payer: Self-pay | Admitting: Neurosurgery

## 2020-05-31 ENCOUNTER — Other Ambulatory Visit (HOSPITAL_BASED_OUTPATIENT_CLINIC_OR_DEPARTMENT_OTHER): Payer: Self-pay

## 2020-05-31 ENCOUNTER — Other Ambulatory Visit (HOSPITAL_COMMUNITY): Payer: Self-pay | Admitting: Neurosurgery

## 2020-05-31 ENCOUNTER — Other Ambulatory Visit: Payer: Self-pay | Admitting: Student in an Organized Health Care Education/Training Program

## 2020-05-31 DIAGNOSIS — G8929 Other chronic pain: Secondary | ICD-10-CM

## 2020-05-31 DIAGNOSIS — M48062 Spinal stenosis, lumbar region with neurogenic claudication: Secondary | ICD-10-CM

## 2020-05-31 DIAGNOSIS — R29898 Other symptoms and signs involving the musculoskeletal system: Secondary | ICD-10-CM

## 2020-05-31 DIAGNOSIS — M5416 Radiculopathy, lumbar region: Secondary | ICD-10-CM

## 2020-05-31 NOTE — Progress Notes (Signed)
Orders Placed This Encounter  Procedures  . Lumbar Epidural Injection    Standing Status:   Standing    Number of Occurrences:   9    Standing Expiration Date:   05/31/2021    Scheduling Instructions:     Purpose: Palliative     Indication: Lower extremity pain/Sciatica unspecified side (M54.30).     Side: Midline     Level: TBD     Sedation: Patient's choice.     TIMEFRAME: PRN procedure. (Blake [name] will call when needed.)    Order Specific Question:   Where will this procedure be performed?    Answer:   [facility]

## 2020-06-01 ENCOUNTER — Other Ambulatory Visit (HOSPITAL_BASED_OUTPATIENT_CLINIC_OR_DEPARTMENT_OTHER): Payer: Self-pay

## 2020-06-11 ENCOUNTER — Ambulatory Visit
Admission: RE | Admit: 2020-06-11 | Discharge: 2020-06-11 | Disposition: A | Discharge status: Home / Self Care | Payer: Medicare Other | Source: Ambulatory Visit | Attending: Student in an Organized Health Care Education/Training Program | Admitting: Student in an Organized Health Care Education/Training Program

## 2020-06-11 ENCOUNTER — Ambulatory Visit (HOSPITAL_BASED_OUTPATIENT_CLINIC_OR_DEPARTMENT_OTHER): Payer: Medicare Other | Admitting: Student in an Organized Health Care Education/Training Program

## 2020-06-11 ENCOUNTER — Other Ambulatory Visit: Payer: Self-pay

## 2020-06-11 ENCOUNTER — Encounter: Payer: Self-pay | Admitting: Student in an Organized Health Care Education/Training Program

## 2020-06-11 VITALS — BP 172/80 | HR 62 | Temp 97.2°F | Resp 22 | Ht 65.0 in | Wt 153.0 lb

## 2020-06-11 DIAGNOSIS — G894 Chronic pain syndrome: Secondary | ICD-10-CM

## 2020-06-11 DIAGNOSIS — M5416 Radiculopathy, lumbar region: Secondary | ICD-10-CM | POA: Diagnosis present

## 2020-06-11 MED ORDER — ROPIVACAINE HCL 2 MG/ML IJ SOLN
INTRAMUSCULAR | Status: AC
  Filled 2020-06-11: qty 10

## 2020-06-11 MED ORDER — DEXAMETHASONE SODIUM PHOSPHATE 10 MG/ML IJ SOLN
INTRAMUSCULAR | Status: AC
  Filled 2020-06-11: qty 1

## 2020-06-11 MED ORDER — DEXAMETHASONE SODIUM PHOSPHATE 10 MG/ML IJ SOLN
10.0000 mg | Freq: Once | INTRAMUSCULAR | Status: AC
  Administered 2020-06-11: 10 mg

## 2020-06-11 MED ORDER — SODIUM CHLORIDE (PF) 0.9 % IJ SOLN
INTRAMUSCULAR | Status: AC
  Filled 2020-06-11: qty 10

## 2020-06-11 MED ORDER — LIDOCAINE HCL 2 % IJ SOLN
20.0000 mL | Freq: Once | INTRAMUSCULAR | Status: AC
  Administered 2020-06-11: 400 mg

## 2020-06-11 MED ORDER — LIDOCAINE HCL (PF) 2 % IJ SOLN
INTRAMUSCULAR | Status: AC
  Filled 2020-06-11: qty 10

## 2020-06-11 MED ORDER — ROPIVACAINE HCL 2 MG/ML IJ SOLN
2.0000 mL | Freq: Once | INTRAMUSCULAR | Status: AC
  Administered 2020-06-11: 2 mL via EPIDURAL

## 2020-06-11 MED ORDER — SODIUM CHLORIDE 0.9% FLUSH
2.0000 mL | Freq: Once | INTRAVENOUS | Status: AC
  Administered 2020-06-11: 2 mL

## 2020-06-11 NOTE — Progress Notes (Signed)
PROVIDER NOTE: Information contained herein reflects review and annotations entered in association with encounter. Interpretation of such information and data should be left to medically-trained personnel. Information provided to patient can be located elsewhere in the medical record under "Patient Instructions". Document created using STT-dictation technology, any transcriptional errors that may result from process are unintentional.    Patient: Blake [name]  Service Category: Procedure  Provider: Gillis Santa, MD  DOB: 03/10/1946  DOS: 06/11/2020  Location: [facility]  MRN: [id number]  Setting: Ambulatory - outpatient  Referring Provider: Idelle Crouch, MD  Type: Established Patient  Specialty: Interventional Pain Management  PCP: Idelle Crouch, MD   Primary Reason for Visit: Interventional Pain Management Treatment. CC: Back Pain (Lumbar bilateral, right is worse joint replacement in right hip )  Procedure:          Anesthesia, Analgesia, Anxiolysis:  Type: Therapeutic Epidural Steroid Injection #2  Region: Caudal Level: Sacrococcygeal   Laterality: Midline aiming at the right  Initially attempted right L4-L5 as well as right L5-S1 epidural steroid injection however given significant laminar arthrosis and collapsed disc space, unable to gain successful access to epidural space.  Discussed with patient and procedure change to caudal epidural.  Patient's pain decreased from 8 to 0 after right caudal epidural steroid injection.     Type: Local Anesthesia  Local Anesthetic: Lidocaine 1-2%  Position: Prone   Indications: 1. Lumbar radiculopathy   2. Chronic pain syndrome    Pain Score: Pre-procedure: 8 /10 Post-procedure: 0-No pain/10   Pre-op H&P Assessment:  Blake [name] is a 75 y.o. (year old), male patient, seen today for interventional treatment. He  has a past surgical history that includes Partial hip arthroplasty (Right, 2014); Tongue Biopsy (2006);  Port-a-cath removal; Colonoscopy (12/08/2003); Tonsillectomy; Upper gi endoscopy (01/25/14); Lumbar laminectomy/decompression microdiscectomy (N/A, 03/03/2018); Joint replacement (Right); Trigger finger release (Right, 2013); Colonoscopy with propofol (N/A, 03/21/2019); Esophagogastroduodenoscopy (egd) with propofol (N/A, 03/21/2019); esophogeal cancer; Abdominal surgery; Portacath placement (Left, 01/12/2020); and Lumbar laminectomy/decompression microdiscectomy (N/A, 02/24/2020). Blake [name] has a current medication list which includes the following prescription(s): amiodarone, fludrocortisone, gabapentin, lidocaine-prilocaine, metformin, methocarbamol, metoprolol succinate, nabumetone, omeprazole, promethazine, tamsulosin, amoxicillin-clavulanate, and [DISCONTINUED] prochlorperazine. His primarily concern today is the Back Pain (Lumbar bilateral, right is worse joint replacement in right hip )  Initial Vital Signs:  Pulse/HCG Rate: 63  Temp: (!) 97.2 F (36.2 C) Resp: 16 BP: (!) 162/68 SpO2: 100 %  BMI: Estimated body mass index is 25.46 kg/m as calculated from the following:   Height as of this encounter: 5\' 5"  (1.651 m).   Weight as of this encounter: 153 lb (69.4 kg).  Risk Assessment: Allergies: Reviewed. He is allergic to sulfa antibiotics.  Allergy Precautions: None required Coagulopathies: Reviewed. None identified.  Blood-thinner therapy: None at this time Active Infection(s): Reviewed. None identified. Blake [name] is afebrile  Site Confirmation: Blake [name] was asked to confirm the procedure and laterality before marking the site Procedure checklist: Completed Consent: Before the procedure and under the influence of no sedative(s), amnesic(s), or anxiolytics, the patient was informed of the treatment options, risks and possible complications. To fulfill our ethical and legal obligations, as recommended by the American Medical Association's Code of Ethics, I have informed the patient of my  clinical impression; the nature and purpose of the treatment or procedure; the risks, benefits, and possible complications of the intervention; the alternatives, including doing nothing; the risk(s) and benefit(s) of the alternative treatment(s) or procedure(s); and the  risk(s) and benefit(s) of doing nothing. The patient was provided information about the general risks and possible complications associated with the procedure. These may include, but are not limited to: failure to achieve desired goals, infection, bleeding, organ or nerve damage, allergic reactions, paralysis, and death. In addition, the patient was informed of those risks and complications associated to Spine-related procedures, such as failure to decrease pain; infection (i.e.: Meningitis, epidural or intraspinal abscess); bleeding (i.e.: epidural hematoma, subarachnoid hemorrhage, or any other type of intraspinal or peri-dural bleeding); organ or nerve damage (i.e.: Any type of peripheral nerve, nerve root, or spinal cord injury) with subsequent damage to sensory, motor, and/or autonomic systems, resulting in permanent pain, numbness, and/or weakness of one or several areas of the body; allergic reactions; (i.e.: anaphylactic reaction); and/or death. Furthermore, the patient was informed of those risks and complications associated with the medications. These include, but are not limited to: allergic reactions (i.e.: anaphylactic or anaphylactoid reaction(s)); adrenal axis suppression; blood sugar elevation that in diabetics may result in ketoacidosis or comma; water retention that in patients with history of congestive heart failure may result in shortness of breath, pulmonary edema, and decompensation with resultant heart failure; weight gain; swelling or edema; medication-induced neural toxicity; particulate matter embolism and blood vessel occlusion with resultant organ, and/or nervous system infarction; and/or aseptic necrosis of one or  more joints. Finally, the patient was informed that Medicine is not an exact science; therefore, there is also the possibility of unforeseen or unpredictable risks and/or possible complications that may result in a catastrophic outcome. The patient indicated having understood very clearly. We have given the patient no guarantees and we have made no promises. Enough time was given to the patient to ask questions, all of which were answered to the patient's satisfaction. Mr. Korff has indicated that he wanted to continue with the procedure. Attestation: I, the ordering provider, attest that I have discussed with the patient the benefits, risks, side-effects, alternatives, likelihood of achieving goals, and potential problems during recovery for the procedure that I have provided informed consent. Date  Time: 06/11/2020  9:24 AM  Pre-Procedure Preparation:  Monitoring: As per clinic protocol. Respiration, ETCO2, SpO2, BP, heart rate and rhythm monitor placed and checked for adequate function Safety Precautions: Patient was assessed for positional comfort and pressure points before starting the procedure. Time-out: I initiated and conducted the "Time-out" before starting the procedure, as per protocol. The patient was asked to participate by confirming the accuracy of the "Time Out" information. Verification of the correct person, site, and procedure were performed and confirmed by me, the nursing staff, and the patient. "Time-out" conducted as per Joint Commission's Universal Protocol (UP.01.01.01). Time: 1010  Description of Procedure:          Target Area: Caudal Epidural Canal. Approach: Midline approach. Area Prepped: Entire Posterior Sacrococcygeal Region DuraPrep (Iodine Povacrylex [0.7% available iodine] and Isopropyl Alcohol, 74% w/w) Safety Precautions: Aspiration looking for blood return was conducted prior to all injections. At no point did we inject any substances, as a needle was being  advanced. No attempts were made at seeking any paresthesias. Safe injection practices and needle disposal techniques used. Medications properly checked for expiration dates. SDV (single dose vial) medications used. Description of the Procedure: Protocol guidelines were followed. The patient was placed in position over the fluoroscopy table. The target area was identified and the area prepped in the usual manner. Skin & deeper tissues infiltrated with local anesthetic. Appropriate amount of time allowed to  pass for local anesthetics to take effect. The procedure needles were then advanced to the target area. Proper needle placement secured. Negative aspiration confirmed. Solution injected in intermittent fashion, asking for systemic symptoms every 0.5cc of injectate. The needles were then removed and the area cleansed, making sure to leave some of the prepping solution back to take advantage of its long term bactericidal properties. Vitals:   06/11/20 0930 06/11/20 1000 06/11/20 1010 06/11/20 1020  BP: (!) 162/68 (!) 182/86 (!) 176/82 (!) 172/80  Pulse: 63 66 62 62  Resp: 16 20 (!) 23 (!) 22  Temp: (!) 97.2 F (36.2 C)     TempSrc: Temporal     SpO2: 100% 99% 97% 97%  Weight: 153 lb (69.4 kg)     Height: 5\' 5"  (1.651 m)       Start Time: 1010 hrs. End Time: 1020 (Patient to monitor 15 minutes in recovery) hrs. Materials: 3 Needle(s) Type: Epidural needle Gauge: 22G Length: 3.5-in Medication(s): Please see orders for medications and dosing details. 6 cc solution made of 3 cc of preservative-free saline, 2 cc of 0.2% ropivacaine, 1 cc of Decadron 10 mg/cc.  Imaging Guidance (Spinal):          Type of Imaging Technique: Fluoroscopy Guidance (Spinal) Indication(s): Assistance in needle guidance and placement for procedures requiring needle placement in or near specific anatomical locations not easily accessible without such assistance. Exposure Time: Please see nurses notes. Contrast: Before  injecting any contrast, we confirmed that the patient did not have an allergy to iodine, shellfish, or radiological contrast. Once satisfactory needle placement was completed at the desired level, radiological contrast was injected. Contrast injected under live fluoroscopy. No contrast complications. See chart for type and volume of contrast used. Fluoroscopic Guidance: I was personally present during the use of fluoroscopy. "Tunnel Vision Technique" used to obtain the best possible view of the target area. Parallax error corrected before commencing the procedure. "Direction-depth-direction" technique used to introduce the needle under continuous pulsed fluoroscopy. Once target was reached, antero-posterior, oblique, and lateral fluoroscopic projection used confirm needle placement in all planes. Images permanently stored in EMR. Interpretation: I personally interpreted the imaging intraoperatively. Adequate needle placement confirmed in multiple planes. Appropriate spread of contrast into desired area was observed. No evidence of afferent or efferent intravascular uptake. No intrathecal or subarachnoid spread observed. Permanent images saved into the patient's record.   Post-operative Assessment:  Post-procedure Vital Signs:  Pulse/HCG Rate: 62  Temp: (!) 97.2 F (36.2 C) Resp: (!) 22 BP: (!) 172/80 SpO2: 97 %  EBL: None  Complications: No immediate post-treatment complications observed by team, or reported by patient.  Note: The patient tolerated the entire procedure well. A repeat set of vitals were taken after the procedure and the patient was kept under observation following institutional policy, for this type of procedure. Post-procedural neurological assessment was performed, showing return to baseline, prior to discharge. The patient was provided with post-procedure discharge instructions, including a section on how to identify potential problems. Should any problems arise concerning this  procedure, the patient was given instructions to immediately contact us, at any time, without hesitation. In any case, we plan to contact the patient by telephone for a follow-up status report regarding this interventional procedure.  Comments:  No additional relevant information.  Patient sitting upright in wheelchair in recovery room.  Able to ambulate without his cane.  States that his right leg pain is a 0.  Plan of Care  Orders:  Orders  Placed This Encounter  Procedures  . DG PAIN [facility]IN[facility] C-ARM 1-60 MIN NO REPORT    Intraoperative interpretation by procedural physician at [facility].    Standing Status:   Standing    Number of Occurrences:   1    Order Specific Question:   Reason for exam:    Answer:   Assistance in needle guidance and placement for procedures requiring needle placement in or near specific anatomical locations not easily accessible without such assistance.   Medications ordered for procedure: Meds ordered this encounter  Medications  . lidocaine (XYLOCAINE) 2 % (with pres) injection 400 mg  . ropivacaine (PF) 2 mg/mL (0.2%) (NAROPIN) injection 2 mL  . sodium chloride flush (NS) 0.9 % injection 2 mL  . dexamethasone (DECADRON) injection 10 mg   Medications administered: We administered lidocaine, ropivacaine (PF) 2 mg/mL (0.2%), sodium chloride flush, and dexamethasone.  See the medical record for exact dosing, route, and time of administration.  Follow-up plan:   No follow-ups on file.      Series of 3 L4-L5 epidural steroid injections followed by L3-L4 and L4-L5 microdiscectomy and laminectomy by Dr. Cari Caraway December 2019.  Return of left radicular pain given immobility after surgery for esophageal cancer.  caudal ESI, left 12/14/2019, right 06/11/2020      Recent Visits No visits were found meeting these conditions. Showing recent visits within past 90 days and meeting all other requirements Today's Visits Date Type Provider Dept  06/11/20  Procedure visit Gillis Santa, MD [facility]  Showing today's visits and meeting all other requirements Future Appointments Date Type Provider Dept  06/28/20 Appointment Gillis Santa, MD [facility]  Showing future appointments within next 90 days and meeting all other requirements  Disposition: Discharge home  Discharge (Date  Time): 06/11/2020;   hrs.   Primary Care Physician: Idelle Crouch, MD Location: [facility] Note by: Gillis Santa, MD Date: 06/11/2020; Time: 12:05 PM  Disclaimer:  Medicine is not an exact science. The only guarantee in medicine is that nothing is guaranteed. It is important to note that the decision to proceed with this intervention was based on the information collected from the patient. The Data and conclusions were drawn from the patient's questionnaire, the interview, and the physical examination. Because the information was provided in large part by the patient, it cannot be guaranteed that it has not been purposely or unconsciously manipulated. Every effort has been made to obtain as much relevant data as possible for this evaluation. It is important to note that the conclusions that lead to this procedure are derived in large part from the available data. Always take into account that the treatment will also be dependent on availability of resources and existing treatment guidelines, considered by other Pain Management Practitioners as being common knowledge and practice, at the time of the intervention. For Medico-Legal purposes, it is also important to point out that variation in procedural techniques and pharmacological choices are the acceptable norm. The indications, contraindications, technique, and results of the above procedure should only be interpreted and judged by a Board-Certified Interventional Pain Specialist with extensive familiarity and expertise in the same exact procedure and technique.

## 2020-06-11 NOTE — Progress Notes (Signed)
Safety precautions to be maintained throughout the outpatient stay will include: orient to surroundings, keep bed in low position, maintain call bell within reach at all times, provide assistance with transfer out of bed and ambulation.  

## 2020-06-11 NOTE — Patient Instructions (Signed)

## 2020-06-12 ENCOUNTER — Telehealth: Payer: Self-pay

## 2020-06-12 NOTE — Telephone Encounter (Signed)
Post procedure phone call.  LM 

## 2020-06-14 ENCOUNTER — Other Ambulatory Visit (HOSPITAL_BASED_OUTPATIENT_CLINIC_OR_DEPARTMENT_OTHER): Payer: Self-pay

## 2020-06-14 ENCOUNTER — Ambulatory Visit
Admission: RE | Admit: 2020-06-14 | Discharge: 2020-06-14 | Disposition: A | Discharge status: Home / Self Care | Payer: Medicare Other | Source: Ambulatory Visit | Attending: Neurosurgery | Admitting: Neurosurgery

## 2020-06-14 ENCOUNTER — Other Ambulatory Visit: Payer: Self-pay

## 2020-06-14 DIAGNOSIS — M48062 Spinal stenosis, lumbar region with neurogenic claudication: Secondary | ICD-10-CM

## 2020-06-14 DIAGNOSIS — M5441 Lumbago with sciatica, right side: Secondary | ICD-10-CM | POA: Insufficient documentation

## 2020-06-14 DIAGNOSIS — R29898 Other symptoms and signs involving the musculoskeletal system: Secondary | ICD-10-CM | POA: Insufficient documentation

## 2020-06-14 DIAGNOSIS — G8929 Other chronic pain: Secondary | ICD-10-CM

## 2020-06-14 MED ORDER — GADOBUTROL 1 MMOL/ML IV SOLN
7.0000 mL | Freq: Once | INTRAVENOUS | Status: AC | PRN
  Administered 2020-06-14: 7 mL via INTRAVENOUS

## 2020-06-25 ENCOUNTER — Other Ambulatory Visit (HOSPITAL_BASED_OUTPATIENT_CLINIC_OR_DEPARTMENT_OTHER): Payer: Self-pay

## 2020-06-25 ENCOUNTER — Other Ambulatory Visit: Payer: Self-pay

## 2020-06-25 ENCOUNTER — Telehealth: Payer: Medicare Other | Admitting: Student in an Organized Health Care Education/Training Program

## 2020-06-26 ENCOUNTER — Emergency Department: Payer: Medicare Other

## 2020-06-26 ENCOUNTER — Observation Stay
Admission: EM | Admit: 2020-06-26 | Discharge: 2020-06-27 | Disposition: A | Discharge status: Home / Self Care | Payer: Medicare Other | Attending: Family Medicine | Admitting: Family Medicine

## 2020-06-26 ENCOUNTER — Other Ambulatory Visit: Payer: Self-pay

## 2020-06-26 ENCOUNTER — Other Ambulatory Visit: Payer: Self-pay | Admitting: Neurosurgery

## 2020-06-26 DIAGNOSIS — C029 Malignant neoplasm of tongue, unspecified: Secondary | ICD-10-CM | POA: Diagnosis present

## 2020-06-26 DIAGNOSIS — I2510 Atherosclerotic heart disease of native coronary artery without angina pectoris: Secondary | ICD-10-CM | POA: Insufficient documentation

## 2020-06-26 DIAGNOSIS — K219 Gastro-esophageal reflux disease without esophagitis: Secondary | ICD-10-CM | POA: Diagnosis present

## 2020-06-26 DIAGNOSIS — Z85810 Personal history of malignant neoplasm of tongue: Secondary | ICD-10-CM | POA: Diagnosis not present

## 2020-06-26 DIAGNOSIS — Z87891 Personal history of nicotine dependence: Secondary | ICD-10-CM | POA: Insufficient documentation

## 2020-06-26 DIAGNOSIS — Z96641 Presence of right artificial hip joint: Secondary | ICD-10-CM | POA: Diagnosis not present

## 2020-06-26 DIAGNOSIS — E785 Hyperlipidemia, unspecified: Secondary | ICD-10-CM | POA: Diagnosis present

## 2020-06-26 DIAGNOSIS — R079 Chest pain, unspecified: Secondary | ICD-10-CM

## 2020-06-26 DIAGNOSIS — I160 Hypertensive urgency: Secondary | ICD-10-CM | POA: Diagnosis not present

## 2020-06-26 DIAGNOSIS — Z8501 Personal history of malignant neoplasm of esophagus: Secondary | ICD-10-CM | POA: Diagnosis not present

## 2020-06-26 DIAGNOSIS — J90 Pleural effusion, not elsewhere classified: Principal | ICD-10-CM | POA: Insufficient documentation

## 2020-06-26 DIAGNOSIS — R0602 Shortness of breath: Secondary | ICD-10-CM | POA: Diagnosis present

## 2020-06-26 DIAGNOSIS — Z7984 Long term (current) use of oral hypoglycemic drugs: Secondary | ICD-10-CM | POA: Diagnosis not present

## 2020-06-26 DIAGNOSIS — Z9889 Other specified postprocedural states: Secondary | ICD-10-CM

## 2020-06-26 DIAGNOSIS — Z79899 Other long term (current) drug therapy: Secondary | ICD-10-CM | POA: Diagnosis not present

## 2020-06-26 DIAGNOSIS — R519 Headache, unspecified: Secondary | ICD-10-CM | POA: Insufficient documentation

## 2020-06-26 DIAGNOSIS — I6523 Occlusion and stenosis of bilateral carotid arteries: Secondary | ICD-10-CM | POA: Diagnosis present

## 2020-06-26 DIAGNOSIS — Z20822 Contact with and (suspected) exposure to covid-19: Secondary | ICD-10-CM | POA: Insufficient documentation

## 2020-06-26 DIAGNOSIS — M5136 Other intervertebral disc degeneration, lumbar region: Secondary | ICD-10-CM | POA: Diagnosis present

## 2020-06-26 DIAGNOSIS — I10 Essential (primary) hypertension: Secondary | ICD-10-CM | POA: Diagnosis present

## 2020-06-26 DIAGNOSIS — G894 Chronic pain syndrome: Secondary | ICD-10-CM | POA: Diagnosis present

## 2020-06-26 DIAGNOSIS — E119 Type 2 diabetes mellitus without complications: Secondary | ICD-10-CM | POA: Diagnosis not present

## 2020-06-26 DIAGNOSIS — Z8589 Personal history of malignant neoplasm of other organs and systems: Secondary | ICD-10-CM

## 2020-06-26 DIAGNOSIS — C159 Malignant neoplasm of esophagus, unspecified: Secondary | ICD-10-CM | POA: Diagnosis present

## 2020-06-26 DIAGNOSIS — Z800 Family history of malignant neoplasm of digestive organs: Secondary | ICD-10-CM

## 2020-06-26 DIAGNOSIS — C155 Malignant neoplasm of lower third of esophagus: Secondary | ICD-10-CM | POA: Diagnosis present

## 2020-06-26 DIAGNOSIS — Z85828 Personal history of other malignant neoplasm of skin: Secondary | ICD-10-CM | POA: Diagnosis not present

## 2020-06-26 LAB — BASIC METABOLIC PANEL
Anion gap: 9 (ref 5–15)
BUN: 11 mg/dL (ref 8–23)
CO2: 28 mmol/L (ref 22–32)
Calcium: 9.2 mg/dL (ref 8.9–10.3)
Chloride: 101 mmol/L (ref 98–111)
Creatinine, Ser: 0.93 mg/dL (ref 0.61–1.24)
GFR, Estimated: >60 mL/min (ref >60)
Glucose, Bld: 169 mg/dL — ABNORMAL HIGH (ref 70–99)
Potassium: 4.1 mmol/L (ref 3.5–5.1)
Sodium: 138 mmol/L (ref 135–145)

## 2020-06-26 LAB — TSH: TSH: 5.484 u[IU]/mL — ABNORMAL HIGH (ref 0.350–4.500)

## 2020-06-26 LAB — CBC
HCT: 43.8 % (ref 39.0–52.0)
Hemoglobin: 13.8 g/dL (ref 13.0–17.0)
MCH: 26.8 pg (ref 26.0–34.0)
MCHC: 31.5 g/dL (ref 30.0–36.0)
MCV: 85.2 fL (ref 80.0–100.0)
Platelets: 323 10*3/uL (ref 150–400)
RBC: 5.14 MIL/uL (ref 4.22–5.81)
RDW: 15.2 % (ref 11.5–15.5)
WBC: 12.6 10*3/uL — ABNORMAL HIGH (ref 4.0–10.5)
nRBC: 0 % (ref 0.0–0.2)

## 2020-06-26 LAB — TROPONIN I (HIGH SENSITIVITY)
Troponin I (High Sensitivity): 27 ng/L — ABNORMAL HIGH (ref <18)
Troponin I (High Sensitivity): 26 ng/L — ABNORMAL HIGH (ref <18)

## 2020-06-26 LAB — PROCALCITONIN: Procalcitonin: <0.1 ng/mL

## 2020-06-26 LAB — BRAIN NATRIURETIC PEPTIDE: B Natriuretic Peptide: 989.1 pg/mL — ABNORMAL HIGH (ref 0.0–100.0)

## 2020-06-26 MED ORDER — ENOXAPARIN SODIUM 40 MG/0.4ML ~~LOC~~ SOLN
40.0000 mg | SUBCUTANEOUS | Status: DC
  Administered 2020-06-26: 40 mg via SUBCUTANEOUS
  Filled 2020-06-26: qty 0.4

## 2020-06-26 MED ORDER — AMIODARONE HCL 200 MG PO TABS: 200.0000 mg | ORAL_TABLET | Freq: Every day | ORAL | Status: DC

## 2020-06-26 MED ORDER — LISINOPRIL 10 MG PO TABS
20.0000 mg | ORAL_TABLET | Freq: Every day | ORAL | Status: DC
  Administered 2020-06-26: 20 mg via ORAL
  Filled 2020-06-26 (×2): qty 2

## 2020-06-26 MED ORDER — IOHEXOL 350 MG/ML SOLN
75.0000 mL | Freq: Once | INTRAVENOUS | Status: AC | PRN
  Administered 2020-06-26: 75 mL via INTRAVENOUS

## 2020-06-26 MED ORDER — METOPROLOL TARTRATE 5 MG/5ML IV SOLN
5.0000 mg | INTRAVENOUS | Status: DC | PRN
Start: 2020-06-26 — End: 2020-06-27

## 2020-06-26 MED ORDER — IPRATROPIUM-ALBUTEROL 0.5-2.5 (3) MG/3ML IN SOLN: 3.0000 mL | Freq: Four times a day (QID) | RESPIRATORY_TRACT | Status: DC | PRN

## 2020-06-26 MED ORDER — LISINOPRIL 10 MG PO TABS: 20.0000 mg | ORAL_TABLET | Freq: Every day | ORAL | Status: DC

## 2020-06-26 MED ORDER — PANTOPRAZOLE SODIUM 40 MG PO TBEC
80.0000 mg | DELAYED_RELEASE_TABLET | Freq: Every day | ORAL | Status: DC
  Filled 2020-06-26: qty 2

## 2020-06-26 MED ORDER — ONDANSETRON HCL 4 MG PO TABS: 4.0000 mg | ORAL_TABLET | Freq: Four times a day (QID) | ORAL | Status: DC | PRN

## 2020-06-26 MED ORDER — AMIODARONE HCL 200 MG PO TABS
200.0000 mg | ORAL_TABLET | Freq: Every day | ORAL | Status: DC
  Administered 2020-06-26: 200 mg via ORAL
  Filled 2020-06-26 (×2): qty 1

## 2020-06-26 MED ORDER — PROCHLORPERAZINE EDISYLATE 10 MG/2ML IJ SOLN
10.0000 mg | Freq: Once | INTRAMUSCULAR | Status: AC
  Administered 2020-06-26: 10 mg via INTRAVENOUS
  Filled 2020-06-26: qty 2

## 2020-06-26 MED ORDER — FLUTICASONE PROPIONATE 50 MCG/ACT NA SUSP
2.0000 | Freq: Every day | NASAL | Status: DC
  Filled 2020-06-26: qty 16

## 2020-06-26 MED ORDER — GABAPENTIN 300 MG PO CAPS
300.0000 mg | ORAL_CAPSULE | Freq: Three times a day (TID) | ORAL | Status: DC
  Administered 2020-06-26: 300 mg via ORAL
  Filled 2020-06-26 (×2): qty 1

## 2020-06-26 MED ORDER — HYDRALAZINE HCL 50 MG PO TABS
25.0000 mg | ORAL_TABLET | Freq: Three times a day (TID) | ORAL | Status: DC
  Administered 2020-06-26 (×2): 25 mg via ORAL
  Filled 2020-06-26 (×2): qty 1

## 2020-06-26 MED ORDER — METHOCARBAMOL 500 MG PO TABS
500.0000 mg | ORAL_TABLET | Freq: Four times a day (QID) | ORAL | Status: DC | PRN
  Filled 2020-06-26: qty 1

## 2020-06-26 MED ORDER — PROMETHAZINE HCL 25 MG PO TABS: 25.0000 mg | ORAL_TABLET | Freq: Four times a day (QID) | ORAL | Status: DC | PRN

## 2020-06-26 MED ORDER — ACETAMINOPHEN 325 MG PO TABS: 650.0000 mg | ORAL_TABLET | Freq: Four times a day (QID) | ORAL | Status: DC | PRN

## 2020-06-26 MED ORDER — ASPIRIN 81 MG PO CHEW
324.0000 mg | CHEWABLE_TABLET | Freq: Once | ORAL | Status: AC
  Administered 2020-06-26: 324 mg via ORAL
  Filled 2020-06-26: qty 4

## 2020-06-26 MED ORDER — NITROGLYCERIN 0.4 MG SL SUBL: 0.4000 mg | SUBLINGUAL_TABLET | SUBLINGUAL | Status: DC | PRN

## 2020-06-26 MED ORDER — METOPROLOL SUCCINATE ER 50 MG PO TB24
50.0000 mg | ORAL_TABLET | Freq: Every day | ORAL | Status: DC
  Administered 2020-06-26: 50 mg via ORAL
  Filled 2020-06-26 (×2): qty 1

## 2020-06-26 MED ORDER — DIPHENHYDRAMINE HCL 50 MG/ML IJ SOLN
25.0000 mg | Freq: Once | INTRAMUSCULAR | Status: AC
  Administered 2020-06-26: 25 mg via INTRAVENOUS
  Filled 2020-06-26: qty 1

## 2020-06-26 MED ORDER — ACETAMINOPHEN 650 MG RE SUPP: 650.0000 mg | Freq: Four times a day (QID) | RECTAL | Status: DC | PRN

## 2020-06-26 MED ORDER — SODIUM CHLORIDE 0.9 % IV SOLN
12.5000 mg | Freq: Four times a day (QID) | INTRAVENOUS | Status: DC | PRN
  Filled 2020-06-26: qty 0.5

## 2020-06-26 MED ORDER — TAMSULOSIN HCL 0.4 MG PO CAPS
0.4000 mg | ORAL_CAPSULE | Freq: Every day | ORAL | Status: DC
  Administered 2020-06-26: 0.4 mg via ORAL
  Filled 2020-06-26: qty 1

## 2020-06-26 MED ORDER — METFORMIN HCL 500 MG PO TABS: 500.0000 mg | ORAL_TABLET | Freq: Two times a day (BID) | ORAL | Status: DC

## 2020-06-26 MED ORDER — FLUDROCORTISONE ACETATE 0.1 MG PO TABS
100.0000 ug | ORAL_TABLET | Freq: Every day | ORAL | Status: DC
  Filled 2020-06-26: qty 1

## 2020-06-26 NOTE — ED Provider Notes (Signed)
[facility] Emergency Department Provider Note   ____________________________________________   Event Date/Time   First MD Initiated Contact with Patient 06/26/20 [phone]     (approximate)  I have reviewed the triage vital signs and the nursing notes.   HISTORY  Chief Complaint Shortness of Breath    HPI Blake [name] is a 75 y.o. male with past medical history of hypertension, hyperlipidemia, diabetes, CAD, GERD, SVT, and esophageal cancer status post esophagectomy who presents to the ED complaining of shortness of breath and chest pain.  Patient reports that yesterday he had an episode of dull aching pain in the center of his chest, which she states is not unusual for him following his esophageal surgery.  This went away on its own, but overnight he was woken from sleep with some difficulty catching his breath.  This mild shortness of breath has persisted into today and similar dull chest pain has come back.  He describes the pain as constant, not exacerbated or alleviated by anything.  He has not taken anything for his symptoms at home.  He initially went to the walk-in clinic, where he was referred to the ED for further evaluation.  He reports having a stent placed in his heart multiple years ago, but has not had any issues since then.  He is not currently anticoagulated.        Past Medical History:  Diagnosis Date  . Arthritis   . Back pain    with leg pain  . Basal cell carcinoma 09/04/2009   R cheek 5.5 cm ant to earlobe - 12/26/2009 excision  . Basal cell carcinoma 09/04/2009   R ant nasal alar rim  . Basal cell carcinoma 07/31/2014   R mid brow  . Basal cell carcinoma 07/30/2016   R ant nasal alar rim at ant edge of BCC scar  . Basal cell carcinoma 11/02/2019   L zygoma   . Cancer ([place]) 2021   esophageal  . Cancer ([place]) 2006   base of toungue  . 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 Coronary artery disease    . Diabetes mellitus without complication ([place])   . Dyspnea   . Dysrhythmia   . GERD (gastroesophageal reflux disease)   . Hyperlipidemia   . Hypertension   . Numbness and tingling of both lower extremities    with positioning  . Port-A-Cath in place 2006  . Prostate enlargement   . Skin cancer    several sites/ face cheek  . Squamous cell carcinoma of skin 12/14/2019   L pretibial - ED&C     Patient Active Problem List   Diagnosis Date Noted  . Pneumonia 01/17/2020  . Non-intractable vomiting 12/26/2019  . Esophageal carcinoma ([place]) 12/09/2019  . History of head and neck cancer 12/09/2019  . Chronic pain syndrome 12/08/2019  . History of lumbar laminectomy 12/08/2019  . Supraventricular tachycardia ([place]) 12/08/2019  . Acute postoperative pain 09/13/2019  . Malignant neoplasm of lower third of esophagus ([place]) 07/14/2019  . Goals of care, counseling/discussion 05/04/2019  . Osteoarthritis 04/26/2019  . 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 Bilateral hand numbness 12/16/2018  . Bilateral hand pain 12/16/2018  . Lumbar radiculopathy 06/02/2017  . Lumbar degenerative disc disease 06/02/2017  . Spinal stenosis, lumbar region, 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  . Primary osteoarthritis of right shoulder 07/01/2016  . Bilateral carotid artery stenosis 06/05/2016  . Carotid stenosis 04/22/2016  . Allergic rhinitis 08/24/2015  . Diabetes mellitus (  [place]) 08/24/2015  . 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 HLD (hyperlipidemia) 08/24/2015  . 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 Stenosing tenosynovitis of finger 07/11/2014  . Coronary artery disease 06/26/2014  . Family history of cancer of digestive system 10/20/2006  . 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 Acid reflux 04/01/2003  . Essential (primary) hypertension 04/01/2003    Past Surgical History:  Procedure Laterality Date  . ABDOMINAL SURGERY    . COLONOSCOPY  12/08/2003  .  COLONOSCOPY WITH PROPOFOL N/A 03/21/2019   Procedure: COLONOSCOPY WITH PROPOFOL;  Surgeon: Toledo, Benay Pike, MD;  Location: [facility];  Service: Gastroenterology;  Laterality: N/A;  . ESOPHAGOGASTRODUODENOSCOPY (EGD) WITH PROPOFOL N/A 03/21/2019   Procedure: ESOPHAGOGASTRODUODENOSCOPY (EGD) WITH PROPOFOL;  Surgeon: Toledo, Benay Pike, MD;  Location: [facility];  Service: Gastroenterology;  Laterality: N/A;  . esophogeal cancer    . JOINT REPLACEMENT Right    total hip  . LUMBAR LAMINECTOMY/DECOMPRESSION MICRODISCECTOMY N/A 03/03/2018   Procedure: LUMBAR LAMINECTOMY/DECOMPRESSION MICRODISCECTOMY 1 LEVEL-L3-4,L4-5;  Surgeon: Meade Maw, MD;  Location: [facility];  Service: Neurosurgery;  Laterality: N/A;  . LUMBAR LAMINECTOMY/DECOMPRESSION MICRODISCECTOMY N/A 02/24/2020   Procedure: LEFT L3-4 MICRODISCECTOMY, L4-5 DECOMPRESSION;  Surgeon: Meade Maw, MD;  Location: [facility] OR[facility];  Service: Neurosurgery;  Laterality: N/A;  . PARTIAL HIP ARTHROPLASTY Right 2014  . PORT-A-CATH REMOVAL    . PORTACATH PLACEMENT Left 01/12/2020   Procedure: INSERTION PORT-A-CATH, with ultrasound fluoroscopy;  Surgeon: Nestor Lewandowsky, MD;  Location: [facility] ORS;  Service: General;  Laterality: Left;  . TONGUE BIOPSY  2006  . TONSILLECTOMY    . TRIGGER FINGER RELEASE Right 2013  . UPPER GI ENDOSCOPY  01/25/14   multiple gastric polyps    Prior to Admission medications   Medication Sig Start Date End Date Taking? Authorizing Provider  amiodarone (PACERONE) 200 MG tablet Take 200 mg by mouth daily. 11/21/19   [provider]  amoxicillin-clavulanate (AUGMENTIN) 875-125 MG tablet Take 1 tablet by mouth every 12 (twelve) hours. Patient not taking: No sig reported 01/19/20   Fritzi Mandes, MD  fludrocortisone (FLORINEF) 0.1 MG tablet Take 100 mcg by mouth daily. 03/05/20   [provider]  gabapentin (NEURONTIN) 300 MG capsule Take 300 mg by mouth 3 (three) times daily as needed for pain.  02/03/20   [provider]  lidocaine-prilocaine (EMLA) cream Apply to affected area once 12/30/19   Earlie Server, MD  metFORMIN (GLUCOPHAGE) 500 MG tablet Take 500 mg by mouth 2 (two) times daily.  12/19/19   [provider]  methocarbamol (ROBAXIN) 500 MG tablet Take 1 tablet (500 mg total) by mouth every 6 (six) hours as needed for muscle spasms. 02/24/20   Lonell Face, NP  metoprolol succinate (TOPROL-XL) 100 MG 24 hr tablet Take 50 mg by mouth daily.     [provider]  nabumetone (RELAFEN) 500 MG tablet Take 500 mg by mouth 2 (two) times daily. 04/23/20   [provider]  omeprazole (PRILOSEC) 40 MG capsule Take 40 mg by mouth in the morning and at bedtime.    [provider]  promethazine (PHENERGAN) 25 MG tablet TAKE 1 TABLET BY MOUTH EVERY 6 HOURS AS NEEDED FOR NAUSEA OR VOMITING. 02/28/20   Earlie Server, MD  tamsulosin (FLOMAX) 0.4 MG CAPS capsule Take 0.4 mg by mouth daily after supper.  09/01/16   [provider]  prochlorperazine (COMPAZINE) 10 MG tablet Take 1 tablet (10 mg total) by mouth every 6 (six) hours as needed (Nausea  or vomiting). 12/09/19 12/30/19  Earlie Server, MD    Allergies Sulfa antibiotics  Family History  Problem Relation Age of Onset  . Diabetes Mother   . Congestive Heart Failure Mother   . Breast cancer Mother   . Lung cancer Father     Social History Social History   Tobacco Use  . Smoking status: Former Smoker    Packs/day: 1.00    Years: 30.00    Pack years: 30.00    Types: Cigarettes    Quit date: 02/18/1994    Years since quitting: 26.3  . Smokeless tobacco: Never Used  Vaping Use  . Vaping Use: Never used  Substance Use Topics  . Alcohol use: Not Currently  . Drug use: No    Review of Systems  Constitutional: No fever/chills Eyes: No visual changes. ENT: No sore throat. Cardiovascular: Positive for chest pain. Respiratory: Positive for shortness of breath. Gastrointestinal: No abdominal  pain.  No nausea, no vomiting.  No diarrhea.  No constipation. Genitourinary: Negative for dysuria. Musculoskeletal: Negative for back pain. Skin: Negative for rash. Neurological: Negative for headaches, focal weakness or numbness.  ____________________________________________   PHYSICAL EXAM:  VITAL SIGNS: ED Triage Vitals  Enc Vitals Group     BP 06/26/20 0852 (!) 185/92     Pulse Rate 06/26/20 0852 77     Resp 06/26/20 0852 18     Temp 06/26/20 0852 98.2 F (36.8 C)     Temp Source 06/26/20 0852 Oral     SpO2 06/26/20 0852 96 %     Weight 06/26/20 0853 150 lb (68 kg)     Height 06/26/20 0853 5\' 5"  (1.651 m)     Head Circumference --      Peak Flow --      Pain Score 06/26/20 0853 6     Pain Loc --      Pain Edu? --      Excl. in [place]? --     Constitutional: Alert and oriented. Eyes: Conjunctivae are normal. Head: Atraumatic. Nose: No congestion/rhinnorhea. Mouth/Throat: Mucous membranes are moist. Neck: Normal ROM Cardiovascular: Normal rate, regular rhythm. Grossly normal heart sounds.  2+ radial pulses bilaterally. Respiratory: Normal respiratory effort.  No retractions. Lungs CTAB.  No chest wall tenderness to palpation. Gastrointestinal: Soft and nontender. No distention. Genitourinary: deferred Musculoskeletal: No lower extremity tenderness nor edema. Neurologic:  Normal speech and language. No gross focal neurologic deficits are appreciated. Skin:  Skin is warm, dry and intact. No rash noted. Psychiatric: Mood and affect are normal. Speech and behavior are normal.  ____________________________________________   LABS (all labs ordered are listed, but only abnormal results are displayed)  Labs Reviewed  BASIC METABOLIC PANEL - Abnormal; Notable for the following components:      Result Value   Glucose, Bld 169 (*)    All other components within normal limits  CBC - Abnormal; Notable for the following components:   WBC 12.6 (*)    All other components  within normal limits  TROPONIN I (HIGH SENSITIVITY) - Abnormal; Notable for the following components:   Troponin I (High Sensitivity) 27 (*)    All other components within normal limits  TROPONIN I (HIGH SENSITIVITY) - Abnormal; Notable for the following components:   Troponin I (High Sensitivity) 26 (*)    All other components within normal limits  SARS CORONAVIRUS 2 (TAT 6-24 HRS)  PROCALCITONIN   ____________________________________________  EKG  ED ECG REPORT I, Blake Divine, the attending physician, personally  viewed and interpreted this ECG.   Date: 06/26/2020  EKG Time: 8:47  Rate: 77  Rhythm: normal sinus rhythm  Axis: Normal  Intervals:Prolonged QT  ST&T Change: LVH, no ST/T changes   PROCEDURES  Procedure(s) performed (including Critical Care):  .1-3 Lead EKG Interpretation Performed by: Blake Divine, MD Authorized by: Blake Divine, MD     Interpretation: normal     ECG rate:  70-90   ECG rate assessment: normal     Rhythm: sinus rhythm     Ectopy: none     Conduction: normal       ____________________________________________   INITIAL IMPRESSION / ASSESSMENT AND PLAN / ED COURSE       75 year old male with past medical history of hypertension, hyperlipidemia, diabetes, CAD, SVT, GERD, esophageal cancer status post esophagectomy who presents to the ED with chest pain and difficulty breathing developing overnight.  Initial EKG appears consistent with LVH and prolonged QT but no acute ST or T wave changes noted.  Symptoms are concerning for ACS given patient's history, we will load with aspirin and treat pain with nitroglycerin.  Initial troponin is mildly elevated and we will trend.  He is also high risk for PE as he reports a bout of Covid in February and recently received chemotherapy for esophageal cancer.  We will further assess with CTA of his chest.  Chest x-ray reviewed by me with no infiltrate, edema, or effusion.  CTA chest is negative  for PE, does show somewhat improved infiltrates from previous, likely residual from patient's bout with COVID-19 pneumonia 1 to 2 months ago.  Low suspicion for recurrent pneumonia at this time and we will hold off on antibiotics but add procalcitonin.  Patient complains of headache and CT head was performed and negative for acute process.  He also continues to have pressure in the center of his chest and given his cardiac history, case discussed with hospitalist for admission.      ____________________________________________   FINAL CLINICAL IMPRESSION(S) / ED DIAGNOSES  Final diagnoses:  Chest pain, unspecified type  Pleural effusion     ED Discharge Orders    None       Note:  This document was prepared using Dragon voice recognition software and may include unintentional dictation errors.   Blake Divine, MD 06/26/20 1351

## 2020-06-26 NOTE — ED Notes (Signed)
Pt is complaining of a HA 9/10. States out of normal of usual headaches, MD aware.  Pt denies any vision disturbances at this time.

## 2020-06-26 NOTE — H&P (Addendum)
History and Physical   Blake [name] QJF:[id number] DOB: [birth year] DOA: 06/26/2020  PCP: Blake Crouch, MD  Outpatient Specialists: Dr. Izora [name], Neurosurgery Patient coming from: home  I have personally briefly reviewed patient's old medical records in [place].  Chief Concern: shortness of breath   HPI: Blake [name] is a 75 y.o. 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  He states the shortness of breath started when he was at rest while sitting in chair, lasting for about two minutes and goes away.  He does not know what makes it go away.  He endorses dull chest pain that was a 8/10 and now a 4/10 improved in the ED.  He also endorses headache that is 10 out of 10 and is now a little better.  He states he has never felt these things before.  He denies fever, diarrhea, abdominal pain, dysuria.  He does endorse nausea.  Social history: He lives with his wife.  Blake [name] is a former tobacco user and quit smoking in 1995.  Formerly, he smoked 1 pack/day and had a 30-pack-year history.  He denies EtOH and recreational drug use.  Vaccination: He is vaccinated for COVID-19, 3 dosing  ROS: Constitutional: no weight change, no fever ENT/Mouth: no sore throat, no rhinorrhea Eyes: no eye pain, no vision changes Cardiovascular: no chest pain, no dyspnea,  no edema, no palpitations Respiratory: no cough, no sputum, no wheezing Gastrointestinal: no nausea, no vomiting, no diarrhea, no constipation Genitourinary: no urinary incontinence, no dysuria, no hematuria Musculoskeletal: no arthralgias, no myalgias Skin: no skin lesions, no  pruritus, Neuro: + weakness, no loss of consciousness, no syncope Psych: no anxiety, no depression, + decrease appetite Heme/Lymph: no bruising, no bleeding  ED Course: Discussed with ED provider, patient requiring hospitalization due to shortness of breath.  Vitals in the emergency department was remarkable for temperature of 98.2, respiration rate of 21, heart rate of 75, blood pressure 182/92, patient has an oxygen saturation of 96% on room air.  Labs in the emergency department with remarkable for serum sodium of 138, potassium 4.1, chloride 101, bicarb 28, BUN 11, serum creatinine of 0.93, nonfasting blood glucose 169.  WBC in the emergency department was remarkable for 12.6, hemoglobin 13.8, platelet 323.  Troponin initially was 27 and decreased to 26.  Procalcitonin is less than 0.10, TSH was 5.484.  CT head without contrast was read as no acute intracranial abnormality, signs of atrophy and chronic microvascular ischemic change.  Subtle frothy secretions in the right sphenoid sinus may be indicative of mild sinusitis  Assessment/Plan  Principal Problem:   Shortness of breath Active Problems:   Acid reflux   Essential (primary) hypertension   Family history of cancer of digestive system   HLD (hyperlipidemia)   Lumbar degenerative disc disease   Bilateral carotid artery stenosis   Coronary artery disease   Tongue cancer (HCC)   Malignant neoplasm of lower third of esophagus (HCC)   Chronic pain syndrome   History of lumbar laminectomy   Esophageal carcinoma (HCC)   History of head and neck cancer   Chest pain   Hypertensive urgency   # Shortness of breath with chest pain and headache-query hypertensive urgency vs sinusitis # Mild  sinusitis -Patient previously was on antihypertensive medications however this was discontinued after patient lost about 45 pounds status post esophagectomy -Resumed home metoprolol succinate 50 mg daily -Added hydralazine 25 mg every 8  hours -Resumed home lisinopril 20 mg for 06/27/2020 -EKG showing left ventricular hypertrophy indicative of hypertensive disease -Add BNP to labs -Admit to MedSurg with observation and telemetry  # Elevated TSH-given history of esophagectomy and tongue cancer -We will check free T4 and T3, and T4 in the a.m.  # Hypertension-treat as above was previously on lisinopril 20 mg and was discontinued by his PCP due to a 45 pound weight loss status post esophagectomy  # Paroxysmal atrial fibrillation-resumed home amiodarone 200 mg daily metoprolol succinate 100 mg daily  # History of coronary artery disease-status post DES to the mid LAD, first diagonal use with 25% the next on 06/14/2014  # Peripheral neuropathy-resumed gabapentin  # History of Covid infection about 1 month ago while he was on a 1 month vacation to [facility]  As needed medications: Ondansetron for nausea Phenergan IV for vomiting and intractable nausea and vomiting, acetaminophen  A.m. labs: T4 and T3, BMP, CBC  Chart reviewed.   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  DVT prophylaxis: Enoxaparin 40 mg subcutaneous every 24 hours Code Status: Full code Diet: Heart healthy Family Communication: No Disposition Plan: Pending clinical course Consults called: No Admission status: Observation, MedSurg, with telemetry  Past Medical History:  Diagnosis Date  . Arthritis   . Back pain    with leg pain  . Basal cell carcinoma 09/04/2009   R cheek 5.5 cm ant to earlobe - 12/26/2009 excision  . Basal cell carcinoma 09/04/2009   R ant nasal alar rim  . Basal cell carcinoma 07/31/2014   R mid brow  . Basal cell carcinoma 07/30/2016   R ant nasal alar rim at ant edge of BCC scar  . Basal  cell carcinoma 11/02/2019   L zygoma   . Cancer ([place]) 2021   esophageal  . Cancer ([place]) 2006   base of toungue  . 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 Coronary artery disease   . Diabetes mellitus without complication ([place])   . Dyspnea   . Dysrhythmia   . GERD (gastroesophageal reflux disease)   . Hyperlipidemia   . Hypertension   . Numbness and tingling of both lower extremities    with positioning  . Port-A-Cath in place 2006  . Prostate enlargement   . Skin cancer    several sites/ face cheek  . Squamous cell carcinoma of skin 12/14/2019   L pretibial - ED&C    Past Surgical History:  Procedure Laterality Date  . ABDOMINAL SURGERY    . COLONOSCOPY  12/08/2003  . COLONOSCOPY WITH PROPOFOL N/A 03/21/2019   Procedure: COLONOSCOPY WITH PROPOFOL;  Surgeon: Toledo, Benay Pike, MD;  Location: [facility] ENDOSCOPY;  Service: Gastroenterology;  Laterality: N/A;  . ESOPHAGOGASTRODUODENOSCOPY (EGD) WITH PROPOFOL N/A 03/21/2019   Procedure: ESOPHAGOGASTRODUODENOSCOPY (EGD) WITH PROPOFOL;  Surgeon: Toledo, Benay Pike, MD;  Location: [facility] ENDOSCOPY;  Service: Gastroenterology;  Laterality: N/A;  . esophogeal cancer    . JOINT REPLACEMENT Right    total hip  . LUMBAR LAMINECTOMY/DECOMPRESSION MICRODISCECTOMY N/A 03/03/2018   Procedure: LUMBAR LAMINECTOMY/DECOMPRESSION MICRODISCECTOMY 1 LEVEL-L3-4,L4-5;  Surgeon: Meade Maw, MD;  Location:  [facility] ORS;  Service: Neurosurgery;  Laterality: N/A;  . LUMBAR LAMINECTOMY/DECOMPRESSION MICRODISCECTOMY N/A 02/24/2020   Procedure: LEFT L3-4 MICRODISCECTOMY, L4-5 DECOMPRESSION;  Surgeon: Meade Maw, MD;  Location: [facility] ORS;  Service: Neurosurgery;  Laterality: N/A;  . PARTIAL HIP ARTHROPLASTY Right 2014  . PORT-A-CATH REMOVAL    . PORTACATH PLACEMENT Left 01/12/2020   Procedure: INSERTION PORT-A-CATH, with ultrasound fluoroscopy;  Surgeon: Nestor Lewandowsky, MD;  Location: [facility] ORS;  Service: General;  Laterality: Left;   . TONGUE BIOPSY  2006  . TONSILLECTOMY    . TRIGGER FINGER RELEASE Right 2013  . UPPER GI ENDOSCOPY  01/25/14   multiple gastric polyps   Social History:  reports that he quit smoking about 26 years ago. His smoking use included cigarettes. He has a 30.00 pack-year smoking history. He has never used smokeless tobacco. He reports previous alcohol use. He reports that he does not use drugs.  Allergies  Allergen Reactions  . Sulfa Antibiotics Rash   Family History  Problem Relation Age of Onset  . Diabetes Mother   . Congestive Heart Failure Mother   . Breast cancer Mother   . Lung cancer Father    Family history: Family history reviewed and not pertinent  Prior to Admission medications   Medication Sig Start Date End Date Taking? Authorizing Provider  amiodarone (PACERONE) 200 MG tablet Take 200 mg by mouth daily. 11/21/19   [provider]  amoxicillin-clavulanate (AUGMENTIN) 875-125 MG tablet Take 1 tablet by mouth every 12 (twelve) hours. Patient not taking: No sig reported 01/19/20   Fritzi Mandes, MD  fludrocortisone (FLORINEF) 0.1 MG tablet Take 100 mcg by mouth daily. 03/05/20   [provider]  gabapentin (NEURONTIN) 300 MG capsule Take 300 mg by mouth 3 (three) times daily as needed for pain. 02/03/20   [provider]  lidocaine-prilocaine (EMLA) cream Apply to affected area once 12/30/19   Earlie Server, MD  metFORMIN (GLUCOPHAGE) 500 MG tablet Take 500 mg by mouth 2 (two) times daily.  12/19/19   [provider]  methocarbamol (ROBAXIN) 500 MG tablet Take 1 tablet (500 mg total) by mouth every 6 (six) hours as needed for muscle spasms. 02/24/20   Lonell Face, NP  metoprolol succinate (TOPROL-XL) 100 MG 24 hr tablet Take 50 mg by mouth daily.     [provider]  nabumetone (RELAFEN) 500 MG tablet Take 500 mg by mouth 2 (two) times daily. 04/23/20   [provider]  omeprazole (PRILOSEC) 40 MG capsule Take 40 mg by mouth in the  morning and at bedtime.    [provider]  promethazine (PHENERGAN) 25 MG tablet TAKE 1 TABLET BY MOUTH EVERY 6 HOURS AS NEEDED FOR NAUSEA OR VOMITING. 02/28/20   Earlie Server, MD  tamsulosin (FLOMAX) 0.4 MG CAPS capsule Take 0.4 mg by mouth daily after supper.  09/01/16   [provider]  prochlorperazine (COMPAZINE) 10 MG tablet Take 1 tablet (10 mg total) by mouth every 6 (six) hours as needed (Nausea or vomiting). 12/09/19 12/30/19  Earlie Server, MD   Physical Exam: Vitals:   06/26/20 1310 06/26/20 1418 06/26/20 1600 06/26/20 1630  BP: (!) 182/92 (!) 181/103 (!) 175/92   Pulse: 75 77 77 85  Resp: 19 20 (!) 31 (!) 26  Temp:      TempSrc:      SpO2: 95%  94% 91%  Weight:      Height:       Constitutional: appears age-appropriate, NAD, calm, comfortable  Eyes: PERRL, lids and conjunctivae normal ENMT: Mucous membranes are moist. Posterior pharynx clear of any exudate or lesions. Age-appropriate dentition. Hearing appropriate Neck: normal, supple, no masses, no thyromegaly Respiratory: clear to auscultation bilaterally, no wheezing, no crackles. Normal respiratory effort. No accessory muscle use.  Cardiovascular: Regular rate and rhythm, no murmurs / rubs / gallops. No extremity edema. 2+ pedal pulses. No carotid bruits.  Abdomen: no tenderness, no masses palpated, no hepatosplenomegaly. Bowel sounds positive.  Musculoskeletal: no clubbing / cyanosis. No joint deformity upper and lower extremities. Good ROM, no contractures, no atrophy. Normal muscle tone.  Skin: no rashes, lesions, ulcers. No induration Neurologic: Sensation intact. Strength 5/5 in all 4.  Psychiatric: Normal judgment and insight. Alert and oriented x 3. Normal mood.   EKG: independently reviewed, showing sinus rhythm with rate of 77, QTc 493, left ventricular hypertrophy  Chest x-ray on Admission: I personally reviewed and I agree with radiologist reading as below.  DG Chest 2 View  Result Date:  06/26/2020 CLINICAL DATA:  Chest pain EXAM: CHEST - 2 VIEW COMPARISON:  01/17/2020.  PET CT 04/10/2020 FINDINGS: Asymmetric reticular nodular in consolidative density in the right lung with volume loss. Stable reticular opacities on the left. Normal heart size and stable mediastinal contours. Left-sided port with tip at the SVC. No acute osseous finding. IMPRESSION: No acute finding when compared to most recent imaging (PET January 2022). Electronically Signed   By: Monte Fantasia M.D.   On: 06/26/2020 09:19   CT Head Wo Contrast  Result Date: 06/26/2020 CLINICAL DATA:  Headache, suspected intracranial hemorrhage in a 75 year old male EXAM: CT HEAD WITHOUT CONTRAST TECHNIQUE: Contiguous axial images were obtained from the base of the skull through the vertex without intravenous contrast. COMPARISON:  None FINDINGS: Brain: No evidence of acute infarction, hemorrhage, hydrocephalus, extra-axial collection or mass lesion/mass effect. Signs of atrophy and chronic microvascular ischemic change. Vascular: No hyperdense vessel or unexpected calcification. Skull: Normal. Negative for fracture or focal lesion. Sinuses/Orbits: Subtle frothy secretions in the RIGHT sphenoid sinus may be indicative of mild sinusitis. The no acute findings in the orbits. Other: None. IMPRESSION: 1. No acute intracranial abnormality. 2. Signs of atrophy and chronic microvascular ischemic change. 3. Subtle frothy secretions in the RIGHT sphenoid sinus may be indicative of mild sinusitis. Electronically Signed   By: Zetta Bills M.D.   On: 06/26/2020 13:04   CT Angio Chest PE W/Cm &/Or Wo Cm  Result Date: 06/26/2020 CLINICAL DATA:  Chest pain and shortness of breath. COVID infection last month. History of esophageal cancer. EXAM: CT ANGIOGRAPHY CHEST WITH CONTRAST TECHNIQUE: Multidetector CT imaging of the chest was performed using the standard protocol during bolus administration of intravenous contrast. Multiplanar CT image  reconstructions and MIPs were obtained to evaluate the vascular anatomy. CONTRAST:  23mL OMNIPAQUE IOHEXOL 350 MG/ML SOLN COMPARISON:  Chest x-ray from same day. PET-CT dated April 10, 2020. CT chest dated January 17, 2020. FINDINGS: Cardiovascular: Satisfactory opacification of the pulmonary arteries to the segmental level. No evidence of pulmonary embolism. Mild cardiomegaly. No pericardial effusion. No thoracic aortic aneurysm. Coronary, aortic arch, and branch vessel atherosclerotic vascular disease. Unchanged left chest wall port catheter. Mediastinum/Nodes: No enlarged mediastinal, hilar, or axillary lymph nodes. Thyroid gland and trachea demonstrate no significant findings. Postsurgical changes related to partial esophagectomy and gastric pull-through again noted. Lungs/Pleura: Persistent but mildly improved patchy consolidation and clustered nodularity throughout the right lung. Increasing now moderate left pleural effusion. Unchanged trace right pleural effusion with slightly  increased loculated fluid in the right minor fissure. Dependent atelectasis in the left lower lobe. No pneumothorax. Upper Abdomen: No acute abnormality. Evolving area of fat necrosis in the left upper quadrant. Unchanged punctate calculus in the upper pole of the left kidney. Tiny gallstones. Musculoskeletal: No chest wall abnormality. No acute or significant osseous findings. Unchanged L2 hemangioma. Review of the MIP images confirms the above findings. IMPRESSION: 1. No evidence of pulmonary embolism. 2. Persistent but mildly improved multifocal pneumonia/aspiration throughout the right lung. 3. Increasing now moderate left pleural effusion. Unchanged trace right pleural effusion. 4. Aortic Atherosclerosis (ICD10-I70.0). Electronically Signed   By: Titus Dubin M.D.   On: 06/26/2020 13:16    Labs on Admission: I have personally reviewed following labs  CBC: Recent Labs  Lab 06/26/20 0856  WBC 12.6*  HGB 13.8  HCT  43.8  MCV 85.2  PLT 354   Basic Metabolic Panel: Recent Labs  Lab 06/26/20 0856  NA 138  K 4.1  CL 101  CO2 28  GLUCOSE 169*  BUN 11  CREATININE 0.93  CALCIUM 9.2   GFR: Estimated Creatinine Clearance: 60.6 mL/min (by C-G formula based on SCr of 0.93 mg/dL). Liver Function Tests: No results for input(s): AST, ALT, ALKPHOS, BILITOT, PROT, ALBUMIN in the last 168 hours. No results for input(s): LIPASE, AMYLASE in the last 168 hours. No results for input(s): AMMONIA in the last 168 hours. Coagulation Profile: No results for input(s): INR, PROTIME in the last 168 hours. Cardiac Enzymes: No results for input(s): CKTOTAL, CKMB, CKMBINDEX, TROPONINI in the last 168 hours. BNP (last 3 results) No results for input(s): PROBNP in the last 8760 hours. HbA1C: No results for input(s): HGBA1C in the last 72 hours. CBG: No results for input(s): GLUCAP in the last 168 hours. Lipid Profile: No results for input(s): CHOL, HDL, LDLCALC, TRIG, CHOLHDL, LDLDIRECT in the last 72 hours. Thyroid Function Tests: Recent Labs    06/26/20 1123  TSH 5.484*   Anemia Panel: No results for input(s): VITAMINB12, FOLATE, FERRITIN, TIBC, IRON, RETICCTPCT in the last 72 hours. Urine analysis:    Component Value Date/Time   COLORURINE YELLOW (A) 02/17/2020 1308   APPEARANCEUR CLEAR (A) 02/17/2020 1308   APPEARANCEUR Clear 01/12/2013 1457   LABSPEC 1.012 02/17/2020 1308   LABSPEC 1.011 01/12/2013 1457   PHURINE 5.0 02/17/2020 1308   GLUCOSEU NEGATIVE 02/17/2020 1308   GLUCOSEU Negative 01/12/2013 [place] 02/17/2020 [place] 02/17/2020 1308   BILIRUBINUR Negative 01/12/2013 [place] 02/17/2020 [place] 02/17/2020 1308   NITRITE NEGATIVE 02/17/2020 1308   LEUKOCYTESUR NEGATIVE 02/17/2020 1308   LEUKOCYTESUR Negative 01/12/2013 1457    Akita Maxim N Markus Casten D.O. [facility]  If 7PM-7AM, please contact overnight-coverage  provider If 7AM-7PM, please contact day coverage provider [email or web address]  06/26/2020, 4:42 PM

## 2020-06-26 NOTE — ED Triage Notes (Signed)
Pt sent from [facility] with c/o SOB intermittently over the past 3 days, pt also c/o pain radiating in chest and abd, states he does not have and esophagus and his stomach is basically in his chest but the pain is worse then normal pt is in NAD on arrival

## 2020-06-26 NOTE — ED Notes (Signed)
See triage note, pt sent from [facility] for shob that started Friday, worsening this morning at 0400. States dull ache in chest and stomach area but reports this is normal since having part of esophagus removed. Pt in NAD, speaking in complete sentences, to treatment room in w/c

## 2020-06-27 ENCOUNTER — Other Ambulatory Visit: Payer: Self-pay | Admitting: Family Medicine

## 2020-06-27 ENCOUNTER — Other Ambulatory Visit (HOSPITAL_COMMUNITY): Payer: Self-pay

## 2020-06-27 ENCOUNTER — Other Ambulatory Visit (HOSPITAL_BASED_OUTPATIENT_CLINIC_OR_DEPARTMENT_OTHER): Payer: Self-pay

## 2020-06-27 DIAGNOSIS — J90 Pleural effusion, not elsewhere classified: Secondary | ICD-10-CM | POA: Diagnosis not present

## 2020-06-27 DIAGNOSIS — R0602 Shortness of breath: Secondary | ICD-10-CM | POA: Diagnosis not present

## 2020-06-27 LAB — CBC
HCT: 38.4 % — ABNORMAL LOW (ref 39.0–52.0)
Hemoglobin: 12.1 g/dL — ABNORMAL LOW (ref 13.0–17.0)
MCH: 26.7 pg (ref 26.0–34.0)
MCHC: 31.5 g/dL (ref 30.0–36.0)
MCV: 84.8 fL (ref 80.0–100.0)
Platelets: 298 10*3/uL (ref 150–400)
RBC: 4.53 MIL/uL (ref 4.22–5.81)
RDW: 15.2 % (ref 11.5–15.5)
WBC: 8.1 10*3/uL (ref 4.0–10.5)
nRBC: 0 % (ref 0.0–0.2)

## 2020-06-27 LAB — BASIC METABOLIC PANEL
Anion gap: 7 (ref 5–15)
BUN: 12 mg/dL (ref 8–23)
CO2: 28 mmol/L (ref 22–32)
Calcium: 8.8 mg/dL — ABNORMAL LOW (ref 8.9–10.3)
Chloride: 104 mmol/L (ref 98–111)
Creatinine, Ser: 0.95 mg/dL (ref 0.61–1.24)
GFR, Estimated: >60 mL/min (ref >60)
Glucose, Bld: 114 mg/dL — ABNORMAL HIGH (ref 70–99)
Potassium: 3.9 mmol/L (ref 3.5–5.1)
Sodium: 139 mmol/L (ref 135–145)

## 2020-06-27 LAB — T4, FREE: Free T4: 1.47 ng/dL — ABNORMAL HIGH (ref 0.61–1.12)

## 2020-06-27 LAB — SARS CORONAVIRUS 2 (TAT 6-24 HRS): SARS Coronavirus 2: NEGATIVE

## 2020-06-27 MED ORDER — FUROSEMIDE 20 MG PO TABS: 20.0000 mg | ORAL_TABLET | Freq: Every day | ORAL | 2 refills | Status: DC

## 2020-06-27 MED ORDER — LISINOPRIL 10 MG PO TABS: 10.0000 mg | ORAL_TABLET | Freq: Every day | ORAL | 3 refills | Status: DC

## 2020-06-27 MED ORDER — HEPARIN SOD (PORK) LOCK FLUSH 100 UNIT/ML IV SOLN: 500.0000 [IU] | Freq: Once | INTRAVENOUS | Status: AC

## 2020-06-27 MED ORDER — FUROSEMIDE 40 MG PO TABS
20.0000 mg | ORAL_TABLET | Freq: Every day | ORAL | Status: DC
  Filled 2020-06-27: qty 1

## 2020-06-27 MED ORDER — HEPARIN SOD (PORK) LOCK FLUSH 100 UNIT/ML IV SOLN
INTRAVENOUS | Status: AC
  Administered 2020-06-27: 500 [IU] via INTRAVENOUS
  Filled 2020-06-27: qty 5

## 2020-06-27 NOTE — Discharge Summary (Signed)
Physician Discharge Summary  Blake [name] HUT:[id number] DOB: 1945-08-11 DOA: 06/26/2020  PCP: Idelle Crouch, MD  Admit date: 06/26/2020 Discharge date: 06/27/2020  Time spent: 50* minutes  Recommendations for Outpatient Follow-up:  1. Follow-up cardiology in 1 week   Discharge Diagnoses:  Principal Problem:   Shortness of breath Active Problems:   Acid reflux   Essential (primary) hypertension   Family history of cancer of digestive system   HLD (hyperlipidemia)   Lumbar degenerative disc disease   Bilateral carotid artery stenosis   Coronary artery disease   Tongue cancer (HCC)   Malignant neoplasm of lower third of esophagus (HCC)   Chronic pain syndrome   History of lumbar laminectomy   Esophageal carcinoma (HCC)   History of head and neck cancer   Chest pain   Hypertensive urgency   Discharge Condition: Stable  Diet recommendation: Heart healthy diet  Filed Weights   06/26/20 0853  Weight: 68 kg    History of present illness:  75 year old male with medical history of bilateral carotid artery stenosis, esophageal cancer status post esophagectomy, actinic keratosis, hypertension, hyperlipidemia, paroxysmal atrial fibrillation, history of tongue cancer s/p radiation and chemotherapy, CAD came to ED with shortness of breath.  Hospital Course:   Shortness of breath-resolved, patient also had some chest pain.  Troponin x2 were negative.  Chest x-ray was unremarkable.  BNP was elevated 998.0.  He was found to be in mild hypertensive urgency.  Blood pressure medications were adjusted.  He was started on lisinopril.  Echocardiogram from November showed LVH.  I called and discussed with patient's cardiologist Dr. Nehemiah Massed, I suspect that patient has diastolic dysfunction causing shortness of breath.  He agreed with starting Lasix 20 mg daily.  Patient will follow up with cardiology in 1 week.  He will need a BMP in 1 week.  Hypertensive urgency-resolved, patient was  taking Toprol-XL 100 mg daily at home, will add lisinopril 10 mg daily.  Also added on Lasix 20 [place] daily as above.  Follow-up cardiology as outpatient for further recommendations.   Abnormal T4, TSH-patient has very minimal elevation of TSH 5.484 and free T4 is 1.47.  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  No further treatment recommended at this time.  Follow TSH, free T4 periodically as outpatient.   Peripheral neuropathy-continue gabapentin.  History of CAD s/p DES to mid LAD.  Stable.  Cardiac enzymes x2 are negative.  Procedures:    Consultations:    Discharge Exam: Vitals:   06/27/20 0930 06/27/20 0945  BP: 124/61   Pulse: 77 75  Resp: (!) 22 16  Temp:    SpO2: 93% 91%    General: Appears in no acute distress Cardiovascular: S1-S2, regular, no murmur auscultated Respiratory: Clear to auscultation bilaterally  Discharge Instructions   Discharge Instructions    Diet - low sodium heart healthy   Complete by: As directed    Increase activity slowly   Complete by: As directed      Allergies as of 06/27/2020      Reactions   Sulfa Antibiotics Rash      Medication List    TAKE these medications   amiodarone 200 MG tablet Commonly known as: PACERONE Take 200 mg by mouth daily.   fludrocortisone 0.1 MG tablet Commonly known as: FLORINEF Take 100 mcg by mouth daily.   furosemide 20 MG tablet Commonly known as: LASIX Take 1 tablet (20 mg total) by mouth daily. Start taking on: June 28, 2020   gabapentin 300 MG  capsule Commonly known as: NEURONTIN Take 300 mg by mouth 3 (three) times daily as needed for pain.   lidocaine-prilocaine cream Commonly known as: EMLA Apply to affected area once   lisinopril 10 MG tablet Commonly known as: ZESTRIL Take 1 tablet (10 mg total) by mouth daily. Start taking on: June 28, 2020   metFORMIN 500 MG tablet Commonly known as: GLUCOPHAGE Take 500 mg by mouth daily.   methocarbamol 500 MG tablet Commonly known as:  Robaxin Take 1 tablet (500 mg total) by mouth every 6 (six) hours as needed for muscle spasms.   metoprolol succinate 100 MG 24 hr tablet Commonly known as: TOPROL-XL Take 100 mg by mouth daily.   nabumetone 500 MG tablet Commonly known as: RELAFEN Take 500 mg by mouth 2 (two) times daily.   omeprazole 40 MG capsule Commonly known as: PRILOSEC Take 40 mg by mouth 2 (two) times daily.   promethazine 25 MG tablet Commonly known as: PHENERGAN TAKE 1 TABLET BY MOUTH EVERY 6 HOURS AS NEEDED FOR NAUSEA OR VOMITING. What changed: reasons to take this   tamsulosin 0.4 MG Caps capsule Commonly known as: FLOMAX Take 0.4 mg by mouth daily after supper.      Allergies  Allergen Reactions  . Sulfa Antibiotics Rash      The results of significant diagnostics from this hospitalization (including imaging, microbiology, ancillary and laboratory) are listed below for reference.    Significant Diagnostic Studies: DG Chest 2 View  Result Date: 06/26/2020 CLINICAL DATA:  Chest pain EXAM: CHEST - 2 VIEW COMPARISON:  01/17/2020.  PET CT 04/10/2020 FINDINGS: Asymmetric reticular nodular in consolidative density in the right lung with volume loss. Stable reticular opacities on the left. Normal heart size and stable mediastinal contours. Left-sided port with tip at the SVC. No acute osseous finding. IMPRESSION: No acute finding when compared to most recent imaging (PET January 2022). Electronically Signed   By: Monte Fantasia M.D.   On: 06/26/2020 09:19   CT Head Wo Contrast  Result Date: 06/26/2020 CLINICAL DATA:  Headache, suspected intracranial hemorrhage in a 75 year old male EXAM: CT HEAD WITHOUT CONTRAST TECHNIQUE: Contiguous axial images were obtained from the base of the skull through the vertex without intravenous contrast. COMPARISON:  None FINDINGS: Brain: No evidence of acute infarction, hemorrhage, hydrocephalus, extra-axial collection or mass lesion/mass effect. Signs of atrophy and  chronic microvascular ischemic change. Vascular: No hyperdense vessel or unexpected calcification. Skull: Normal. Negative for fracture or focal lesion. Sinuses/Orbits: Subtle frothy secretions in the RIGHT sphenoid sinus may be indicative of mild sinusitis. The no acute findings in the orbits. Other: None. IMPRESSION: 1. No acute intracranial abnormality. 2. Signs of atrophy and chronic microvascular ischemic change. 3. Subtle frothy secretions in the RIGHT sphenoid sinus may be indicative of mild sinusitis. Electronically Signed   By: Zetta Bills M.D.   On: 06/26/2020 13:04   CT Angio Chest PE W/Cm &/Or Wo Cm  Result Date: 06/26/2020 CLINICAL DATA:  Chest pain and shortness of breath. COVID infection last month. History of esophageal cancer. EXAM: CT ANGIOGRAPHY CHEST WITH CONTRAST TECHNIQUE: Multidetector CT imaging of the chest was performed using the standard protocol during bolus administration of intravenous contrast. Multiplanar CT image reconstructions and MIPs were obtained to evaluate the vascular anatomy. CONTRAST:  20mL OMNIPAQUE IOHEXOL 350 MG/ML SOLN COMPARISON:  Chest x-ray from same day. PET-CT dated April 10, 2020. CT chest dated January 17, 2020. FINDINGS: Cardiovascular: Satisfactory opacification of the pulmonary arteries to the segmental  level. No evidence of pulmonary embolism. Mild cardiomegaly. No pericardial effusion. No thoracic aortic aneurysm. Coronary, aortic arch, and branch vessel atherosclerotic vascular disease. Unchanged left chest wall port catheter. Mediastinum/Nodes: No enlarged mediastinal, hilar, or axillary lymph nodes. Thyroid gland and trachea demonstrate no significant findings. Postsurgical changes related to partial esophagectomy and gastric pull-through again noted. Lungs/Pleura: Persistent but mildly improved patchy consolidation and clustered nodularity throughout the right lung. Increasing now moderate left pleural effusion. Unchanged trace right pleural  effusion with slightly increased loculated fluid in the right minor fissure. Dependent atelectasis in the left lower lobe. No pneumothorax. Upper Abdomen: No acute abnormality. Evolving area of fat necrosis in the left upper quadrant. Unchanged punctate calculus in the upper pole of the left kidney. Tiny gallstones. Musculoskeletal: No chest wall abnormality. No acute or significant osseous findings. Unchanged L2 hemangioma. Review of the MIP images confirms the above findings. IMPRESSION: 1. No evidence of pulmonary embolism. 2. Persistent but mildly improved multifocal pneumonia/aspiration throughout the right lung. 3. Increasing now moderate left pleural effusion. Unchanged trace right pleural effusion. 4. Aortic Atherosclerosis (ICD10-I70.0). Electronically Signed   By: Titus Dubin M.D.   On: 06/26/2020 13:16   MR THORACIC SPINE WO CONTRAST  Result Date: 06/15/2020 CLINICAL DATA:  Upper and lower back pain with pain in both legs. Pain is worse on the right, but numbness is worse on the left. Leg weakness. EXAM: MRI THORACIC SPINE WITHOUT CONTRAST MRI LUMBAR SPINE WITH AND WITHOUT CONTRAST TECHNIQUE: Multiplanar and multiecho pulse sequences of the thoracic and lumbar spine were obtained. Thoracic spine imaging was performed without contrast and lumbar spine imaging was performed with and without contrast. COMPARISON:  MRI lumbar spine January 23, 2020.  PET-CT 04/10/2020. FINDINGS: MRI THORACIC SPINE FINDINGS Alignment:  Normal. Vertebrae: Vertebral body heights are maintained. No specific evidence of acute fracture, discitis/osteomyelitis, or suspicious bone lesion. Scattered T1 hyperintense lesions throughout the visualized spine, compatible with vertebral venous malformations with characteristic thickened trabecula on prior CT chest. Cord:  Normal cord signal. Paraspinal and other soft tissues: Surgical changes of gastric pull-through.Nodular airspace disease in the right lung with small right pleural  effusion, suboptimally evaluated on this study. Disc levels: Posterior disc bulges at T3-T4, T6-T7, and T7-T8 contact and indent the ventral cord without significant canal stenosis. Disc protrusions at T5-T6, T9-T10 partially efface ventral CSF without significant canal stenosis. Multilevel facet hypertrophy and right foraminal disc protrusion at T11-T12. Resulting moderate foraminal stenosis on the left at T9-T10 and the right at T11-T12. Mild foraminal stenosis on the right at T5-T6 and the left at T10-T11. No evidence of high-grade foraminal stenosis. MRI LUMBAR SPINE FINDINGS Segmentation:  Standard. Alignment:  Approximately 4 mm of retrolisthesis of L4 on L5. Vertebrae: Vertebral body heights are maintained. Degenerative/discogenic endplate signal changes about the left eccentric L3-L4 disc. No specific evidence of acute fracture, discitis/osteomyelitis, or suspicious bone lesion. 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 Conus medullaris and cauda equina: Conus extends to the L2 level. Conus appears normal Paraspinal and other soft tissues: Right renal cysts. Otherwise, unremarkable. Disc levels: T12-L1: Right foraminal disc protrusion. Mild facet hypertrophy. Mild right foraminal stenosis. No significant canal stenosis. No change. L1-L2: Broad disc bulge with superimposed right foraminal disc protrusion. Mild bilateral facet hypertrophy. No significant canal stenosis. L2-L3: Slight progression in broad disc bulge. Mild bilateral facet hypertrophy and ligamentum flavum thickening. Mildly prominent dorsal epidural fat. Resulting mild canal stenosis, slightly progressed from prior. Moderate bilateral subarticular recess stenosis, similar.  Mild left foraminal stenosis. L3-L4: Postsurgical changes associated with interval left microdiscectomy. Previously seen superiorly dissecting foraminal disc protrusion is decreased in size; however, focal masslike T2 hypointense signal abnormality  in the left epidural canal (for example series 1, image 10) is concerning for residual extruded disc. There is some surrounding enhancing scar tissue posteriorly. Bilateral facet hypertrophy and persistent bilateral foraminal disc protrusions. Overall, there is resulting severe canal stenosis (series 4, image 23), progressed from prior. Severe left and moderate right foraminal stenosis, not substantially changed. L4-L5: Approximately 4 mm of retrolisthesis of L4 on L5, similar. Interval posterior decompression with improved canal and subarticular recess stenosis, now widely patent. Left greater than right endplate spurring with left foraminal disc protrusion. Similar moderate to severe left foraminal stenosis. L5-S1: Bilateral facet hypertrophy and endplate spurring. Similar moderate to severe left and moderate right foraminal stenosis. IMPRESSION: MR LUMBAR SPINE IMPRESSION 1. Interval left L3-L4 microdiscectomy. Decreased size of the previously noted superiorly dissecting left foraminal disc protrusion; however, there is T2 hypointense masslike signal abnormality in the left epidural canal (for example, series 1, image 10), concerning for residual extruded disc material. This along with disc bulge, biforaminal disc protrusions, and facet hypertrophy results in progressive severe canal stenosis with similar severe left and moderate right foraminal stenosis. 2. Interval posterior decompression at L4-L5 with improved canal and subarticular recess stenosis, now patent. 3. Similar moderate to severe left and moderate right foraminal stenosis at L4-L5 and L5-S1. MR THORACIC SPINE IMPRESSION 1. Moderate foraminal stenosis on the left at T9-T10 and the right at T11-T12. Multilevel mild foraminal stenosis, as detailed above. 2. Disc bulges at T3-T4, T6-T7, T7-T8 contact and indent the ventral cord without significant canal stenosis. 3. Surgical changes of gastric pull-through. Partially imaged nodular airspace disease in  the right lung with small right pleural effusion, suboptimally evaluated on this study and better characterized on PET-CT from 04/10/2020. Electronically Signed   By: Margaretha Sheffield MD   On: 06/15/2020 09:53   MR Lumbar Spine W Wo Contrast  Result Date: 06/15/2020 CLINICAL DATA:  Upper and lower back pain with pain in both legs. Pain is worse on the right, but numbness is worse on the left. Leg weakness. EXAM: MRI THORACIC SPINE WITHOUT CONTRAST MRI LUMBAR SPINE WITH AND WITHOUT CONTRAST TECHNIQUE: Multiplanar and multiecho pulse sequences of the thoracic and lumbar spine were obtained. Thoracic spine imaging was performed without contrast and lumbar spine imaging was performed with and without contrast. COMPARISON:  MRI lumbar spine January 23, 2020.  PET-CT 04/10/2020. FINDINGS: MRI THORACIC SPINE FINDINGS Alignment:  Normal. Vertebrae: Vertebral body heights are maintained. No specific evidence of acute fracture, discitis/osteomyelitis, or suspicious bone lesion. Scattered T1 hyperintense lesions throughout the visualized spine, compatible with vertebral venous malformations with characteristic thickened trabecula on prior CT chest. Cord:  Normal cord signal. Paraspinal and other soft tissues: Surgical changes of gastric pull-through.Nodular airspace disease in the right lung with small right pleural effusion, suboptimally evaluated on this study. Disc levels: Posterior disc bulges at T3-T4, T6-T7, and T7-T8 contact and indent the ventral cord without significant canal stenosis. Disc protrusions at T5-T6, T9-T10 partially efface ventral CSF without significant canal stenosis. Multilevel facet hypertrophy and right foraminal disc protrusion at T11-T12. Resulting moderate foraminal stenosis on the left at T9-T10 and the right at T11-T12. Mild foraminal stenosis on the right at T5-T6 and the left at T10-T11. No evidence of high-grade foraminal stenosis. MRI LUMBAR SPINE FINDINGS Segmentation:  Standard.  Alignment:  Approximately 4  mm of retrolisthesis of L4 on L5. Vertebrae: Vertebral body heights are maintained. Degenerative/discogenic endplate signal changes about the left eccentric L3-L4 disc. No specific evidence of acute fracture, discitis/osteomyelitis, or suspicious bone lesion. 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 Conus medullaris and cauda equina: Conus extends to the L2 level. Conus appears normal Paraspinal and other soft tissues: Right renal cysts. Otherwise, unremarkable. Disc levels: T12-L1: Right foraminal disc protrusion. Mild facet hypertrophy. Mild right foraminal stenosis. No significant canal stenosis. No change. L1-L2: Broad disc bulge with superimposed right foraminal disc protrusion. Mild bilateral facet hypertrophy. No significant canal stenosis. L2-L3: Slight progression in broad disc bulge. Mild bilateral facet hypertrophy and ligamentum flavum thickening. Mildly prominent dorsal epidural fat. Resulting mild canal stenosis, slightly progressed from prior. Moderate bilateral subarticular recess stenosis, similar. Mild left foraminal stenosis. L3-L4: Postsurgical changes associated with interval left microdiscectomy. Previously seen superiorly dissecting foraminal disc protrusion is decreased in size; however, focal masslike T2 hypointense signal abnormality in the left epidural canal (for example series 1, image 10) is concerning for residual extruded disc. There is some surrounding enhancing scar tissue posteriorly. Bilateral facet hypertrophy and persistent bilateral foraminal disc protrusions. Overall, there is resulting severe canal stenosis (series 4, image 23), progressed from prior. Severe left and moderate right foraminal stenosis, not substantially changed. L4-L5: Approximately 4 mm of retrolisthesis of L4 on L5, similar. Interval posterior decompression with improved canal and subarticular recess stenosis, now widely patent. Left greater than right  endplate spurring with left foraminal disc protrusion. Similar moderate to severe left foraminal stenosis. L5-S1: Bilateral facet hypertrophy and endplate spurring. Similar moderate to severe left and moderate right foraminal stenosis. IMPRESSION: MR LUMBAR SPINE IMPRESSION 1. Interval left L3-L4 microdiscectomy. Decreased size of the previously noted superiorly dissecting left foraminal disc protrusion; however, there is T2 hypointense masslike signal abnormality in the left epidural canal (for example, series 1, image 10), concerning for residual extruded disc material. This along with disc bulge, biforaminal disc protrusions, and facet hypertrophy results in progressive severe canal stenosis with similar severe left and moderate right foraminal stenosis. 2. Interval posterior decompression at L4-L5 with improved canal and subarticular recess stenosis, now patent. 3. Similar moderate to severe left and moderate right foraminal stenosis at L4-L5 and L5-S1. MR THORACIC SPINE IMPRESSION 1. Moderate foraminal stenosis on the left at T9-T10 and the right at T11-T12. Multilevel mild foraminal stenosis, as detailed above. 2. Disc bulges at T3-T4, T6-T7, T7-T8 contact and indent the ventral cord without significant canal stenosis. 3. Surgical changes of gastric pull-through. Partially imaged nodular airspace disease in the right lung with small right pleural effusion, suboptimally evaluated on this study and better characterized on PET-CT from 04/10/2020. Electronically Signed   By: Margaretha Sheffield MD   On: 06/15/2020 09:53   DG PAIN CLINIC C-ARM 1-60 MIN NO REPORT  Result Date: 06/11/2020 Fluoro was used, but no Radiologist interpretation will be provided. Please refer to "NOTES" tab for provider progress note.   Microbiology: Recent Results (from the past 240 hour(s))  SARS CORONAVIRUS 2 (TAT 6-24 HRS) Nasopharyngeal Nasopharyngeal Swab     Status: None   Collection Time: 06/26/20  2:20 PM   Specimen:  Nasopharyngeal Swab  Result Value Ref Range Status   SARS Coronavirus 2 NEGATIVE NEGATIVE Final    Comment: (NOTE) SARS-CoV-2 target nucleic acids are NOT DETECTED.  The SARS-CoV-2 RNA is generally detectable in upper and lower respiratory specimens during the acute phase of infection. Negative results do not  preclude SARS-CoV-2 infection, do not rule out co-infections with other pathogens, and should not be used as the sole basis for treatment or other patient management decisions. Negative results must be combined with clinical observations, patient history, and epidemiological information. The expected result is Negative.  Fact Sheet for Patients: SugarRoll.be  Fact Sheet for Healthcare Providers: https://www.woods-mathews.com/  This test is not yet approved or cleared by the [place] FDA and  has been authorized for detection and/or diagnosis of SARS-CoV-2 by FDA under an Emergency Use Authorization (EUA). This EUA will remain  in effect (meaning this test can be used) for the duration of the COVID-19 declaration under Se ction 564(b)(1) of the Act, 21 U.S.C. section 360bbb-3(b)(1), unless the authorization is terminated or revoked sooner.  Performed at [facility], [place] [address] [place], [place] [address]      Labs: Basic Metabolic Panel: Recent Labs  Lab 06/26/20 0856 06/27/20 0505  NA 138 139  K 4.1 3.9  CL 101 104  CO2 28 28  GLUCOSE 169* 114*  BUN 11 12  CREATININE 0.93 0.95  CALCIUM 9.2 8.8*   Liver Function Tests: No results for input(s): AST, ALT, ALKPHOS, BILITOT, PROT, ALBUMIN in the last 168 hours. No results for input(s): LIPASE, AMYLASE in the last 168 hours. No results for input(s): AMMONIA in the last 168 hours. CBC: Recent Labs  Lab 06/26/20 0856 06/27/20 0505  WBC 12.6* 8.1  HGB 13.8 12.1*  HCT 43.8 38.4*  MCV 85.2 84.8  PLT 323 298   Cardiac Enzymes: No results for input(s):  CKTOTAL, CKMB, CKMBINDEX, TROPONINI in the last 168 hours. BNP: BNP (last 3 results) Recent Labs    06/26/20 0556  BNP 989.1*    ProBNP (last 3 results) No results for input(s): PROBNP in the last 8760 hours.  CBG: No results for input(s): GLUCAP in the last 168 hours.     Signed:  Oswald Hillock MD.  [facility] 06/27/2020, 12:43 PM

## 2020-06-27 NOTE — ED Notes (Signed)
Pt desat to 88% while sleeping, repositioned in bed and placed on 2L [place]

## 2020-06-28 ENCOUNTER — Other Ambulatory Visit: Payer: Self-pay | Admitting: Neurosurgery

## 2020-06-28 ENCOUNTER — Other Ambulatory Visit: Payer: Self-pay

## 2020-06-28 ENCOUNTER — Ambulatory Visit
Payer: Medicare Other | Attending: Student in an Organized Health Care Education/Training Program | Admitting: Student in an Organized Health Care Education/Training Program

## 2020-06-28 DIAGNOSIS — M5416 Radiculopathy, lumbar region: Secondary | ICD-10-CM

## 2020-06-28 LAB — T4: T4, Total: 9 ug/dL (ref 4.5–12.0)

## 2020-06-28 LAB — T3 UPTAKE: T3 Uptake Ratio: 35 % (ref 24–39)

## 2020-06-28 NOTE — Progress Notes (Signed)
Patient having upcoming surgery.

## 2020-07-02 ENCOUNTER — Other Ambulatory Visit: Payer: Self-pay

## 2020-07-02 MED ORDER — MIRTAZAPINE 15 MG PO TABS
ORAL_TABLET | ORAL | 11 refills | Status: DC
  Filled 2020-07-02: qty 30, 30d supply, fill #0
  Filled 2020-08-14: qty 30, 30d supply, fill #1
  Filled 2020-09-24: qty 30, 30d supply, fill #2
  Filled 2020-10-22: qty 30, 30d supply, fill #3
  Filled 2020-12-10: qty 30, 30d supply, fill #4
  Filled 2021-01-09: qty 30, 30d supply, fill #5
  Filled 2021-02-12: qty 30, 30d supply, fill #6
  Filled 2021-03-11: qty 30, 30d supply, fill #7
  Filled 2021-04-11: qty 30, 30d supply, fill #8
  Filled 2021-05-10: qty 30, 30d supply, fill #9
  Filled 2021-06-10: qty 30, 30d supply, fill #10

## 2020-07-04 ENCOUNTER — Other Ambulatory Visit: Payer: Self-pay

## 2020-07-04 ENCOUNTER — Encounter: Payer: Self-pay | Admitting: Neurosurgery

## 2020-07-04 ENCOUNTER — Inpatient Hospital Stay: Admit: 2020-07-04 | Payer: Medicare Other

## 2020-07-04 ENCOUNTER — Encounter
Admission: RE | Admit: 2020-07-04 | Discharge: 2020-07-04 | Disposition: A | Discharge status: Home / Self Care | Payer: Medicare Other | Source: Ambulatory Visit | Attending: Neurosurgery | Admitting: Neurosurgery

## 2020-07-04 DIAGNOSIS — Z01812 Encounter for preprocedural laboratory examination: Secondary | ICD-10-CM | POA: Insufficient documentation

## 2020-07-04 LAB — PROTIME-INR
INR: 1 (ref 0.8–1.2)
Prothrombin Time: 12.7 seconds (ref 11.4–15.2)

## 2020-07-04 LAB — TYPE AND SCREEN
ABO/RH(D): O POS
Antibody Screen: NEGATIVE

## 2020-07-04 LAB — SURGICAL PCR SCREEN
MRSA, PCR: NEGATIVE
Staphylococcus aureus: NEGATIVE

## 2020-07-04 LAB — APTT: aPTT: 30 seconds (ref 24–36)

## 2020-07-04 NOTE — Patient Instructions (Signed)
Your procedure is scheduled on: Friday July 13, 2020. Report to Day Surgery inside [facility] 2nd floor (stop by admissions desk first before getting on the elevator). To find out your arrival time please call [phone] between 1PM - 3PM on Thursday July 12, 2020.  Remember: Instructions that are not followed completely may result in serious medical risk,  up to and including death, or upon the discretion of your surgeon and anesthesiologist your  surgery may need to be rescheduled.     _X__ 1. Do not eat food after midnight the night before your procedure.                 No chewing gum or hard candies. You may drink clear liquids up to 2 hours                 before you are scheduled to arrive for your surgery- DO not drink clear                 liquids within 2 hours of the start of your surgery.                 Clear Liquids include:  water, apple juice without pulp, clear Gatorade, G2 or                  Gatorade Zero (avoid Red/Purple/Blue), Black Coffee or Tea (Do not add                 anything to coffee or tea).  __X__2.  On the morning of surgery brush your teeth with toothpaste and water, you                may rinse your mouth with mouthwash if you wish.  Do not swallow any toothpaste of mouthwash.     _X__ 3.  No Alcohol for 24 hours before or after surgery.   _X__ 4.  Do Not Smoke or use e-cigarettes For 24 Hours Prior to Your Surgery.                 Do not use any chewable tobacco products for at least 6 hours prior to                 Surgery.  _X__  5.  Do not use any recreational drugs (marijuana, cocaine, heroin, ecstasy, MDMA or other)                For at least one week prior to your surgery.  Combination of these drugs with anesthesia                May have life threatening results.  __X__6.  Notify your doctor if there is any change in your medical condition      (cold, fever, infections).     Do not wear jewelry, make-up,  hairpins, clips or nail polish. Do not wear lotions, powders, or perfumes. You may wear deodorant. Do not shave 48 hours prior to surgery. Men may shave face and neck. Do not bring valuables to the hospital.    [facility] is not responsible for any belongings or valuables.  Contacts, dentures or bridgework may not be worn into surgery. Leave your suitcase in the car. After surgery it may be brought to your room. For patients admitted to the hospital, discharge time is determined by your treatment team.   Patients discharged the day of surgery will not be allowed to drive  home.   Make arrangements for someone to be with you for the first 24 hours of your Same Day Discharge.   __X__ Take these medicines the morning of surgery with A SIP OF WATER:    1. amiodarone (PACERONE) 200 MG  2. gabapentin (NEURONTIN) 300 MG  3. metoprolol succinate (TOPROL-XL) 100 MG   4. omeprazole (PRILOSEC) 40 MG   5.  6.  ____ Fleet Enema (as directed)   __X__ Use CHG Soap (or wipes) as directed  ____ Use Benzoyl Peroxide Gel as instructed  ____ Use inhalers on the day of surgery  __X__ Stop metFORMIN (GLUCOPHAGE) 500 MG (last dose Tuesday April 12) 2 days prior to surgery    ____ Take 1/2 of usual insulin dose the night before surgery. No insulin the morning          of surgery.   ____ Stop Coumadin/Plavix/aspirin   __X__ Stop Anti-inflammatories such as Ibuprofen, Aleve, Advil, naproxen, aspirin and or BC powders.    __X__ Stop supplements until after surgery.    __X__ Do not start any herbal supplements before your procedure.   If you have any questions regarding your pre-procedure instructions,  Please call Pre-admit Testing at [phone].

## 2020-07-06 ENCOUNTER — Other Ambulatory Visit: Payer: Self-pay

## 2020-07-06 ENCOUNTER — Other Ambulatory Visit: Payer: Self-pay | Admitting: Internal Medicine

## 2020-07-06 MED ORDER — TAMSULOSIN HCL 0.4 MG PO CAPS
ORAL_CAPSULE | ORAL | 3 refills | Status: DC
  Filled 2020-07-06: qty 90, 90d supply, fill #0

## 2020-07-11 ENCOUNTER — Other Ambulatory Visit: Payer: Self-pay

## 2020-07-11 ENCOUNTER — Other Ambulatory Visit
Admission: RE | Admit: 2020-07-11 | Discharge: 2020-07-11 | Disposition: A | Discharge status: Home / Self Care | Payer: Medicare Other | Source: Ambulatory Visit | Attending: Neurosurgery | Admitting: Neurosurgery

## 2020-07-11 DIAGNOSIS — Z20822 Contact with and (suspected) exposure to covid-19: Secondary | ICD-10-CM | POA: Insufficient documentation

## 2020-07-11 DIAGNOSIS — Z01812 Encounter for preprocedural laboratory examination: Secondary | ICD-10-CM | POA: Insufficient documentation

## 2020-07-11 LAB — SARS CORONAVIRUS 2 (TAT 6-24 HRS): SARS Coronavirus 2: NEGATIVE

## 2020-07-12 ENCOUNTER — Ambulatory Visit
Admission: RE | Admit: 2020-07-12 | Discharge: 2020-07-12 | Disposition: A | Discharge status: Home / Self Care | Payer: Medicare Other | Source: Ambulatory Visit | Attending: Oncology | Admitting: Oncology

## 2020-07-12 ENCOUNTER — Inpatient Hospital Stay: Payer: Medicare Other | Attending: Oncology

## 2020-07-12 DIAGNOSIS — Z95828 Presence of other vascular implants and grafts: Secondary | ICD-10-CM

## 2020-07-12 DIAGNOSIS — C159 Malignant neoplasm of esophagus, unspecified: Secondary | ICD-10-CM

## 2020-07-12 LAB — CBC WITH DIFFERENTIAL/PLATELET
Abs Immature Granulocytes: 0.03 10*3/uL (ref 0.00–0.07)
Basophils Absolute: 0.1 10*3/uL (ref 0.0–0.1)
Basophils Relative: 1 %
Eosinophils Absolute: 0.3 10*3/uL (ref 0.0–0.5)
Eosinophils Relative: 3 %
HCT: 41.6 % (ref 39.0–52.0)
Hemoglobin: 13 g/dL (ref 13.0–17.0)
Immature Granulocytes: 0 %
Lymphocytes Relative: 10 %
Lymphs Abs: 0.9 10*3/uL (ref 0.7–4.0)
MCH: 27 pg (ref 26.0–34.0)
MCHC: 31.3 g/dL (ref 30.0–36.0)
MCV: 86.5 fL (ref 80.0–100.0)
Monocytes Absolute: 0.6 10*3/uL (ref 0.1–1.0)
Monocytes Relative: 7 %
Neutro Abs: 7.1 10*3/uL (ref 1.7–7.7)
Neutrophils Relative %: 79 %
Platelets: 288 10*3/uL (ref 150–400)
RBC: 4.81 MIL/uL (ref 4.22–5.81)
RDW: 15.8 % — ABNORMAL HIGH (ref 11.5–15.5)
WBC: 9 10*3/uL (ref 4.0–10.5)
nRBC: 0 % (ref 0.0–0.2)

## 2020-07-12 LAB — COMPREHENSIVE METABOLIC PANEL
ALT: 15 U/L (ref 0–44)
AST: 17 U/L (ref 15–41)
Albumin: 3.6 g/dL (ref 3.5–5.0)
Alkaline Phosphatase: 56 U/L (ref 38–126)
Anion gap: 9 (ref 5–15)
BUN: 16 mg/dL (ref 8–23)
CO2: 27 mmol/L (ref 22–32)
Calcium: 8.8 mg/dL — ABNORMAL LOW (ref 8.9–10.3)
Chloride: 103 mmol/L (ref 98–111)
Creatinine, Ser: 0.92 mg/dL (ref 0.61–1.24)
GFR, Estimated: >60 mL/min (ref >60)
Glucose, Bld: 140 mg/dL — ABNORMAL HIGH (ref 70–99)
Potassium: 4.3 mmol/L (ref 3.5–5.1)
Sodium: 139 mmol/L (ref 135–145)
Total Bilirubin: 0.4 mg/dL (ref 0.3–1.2)
Total Protein: 6.5 g/dL (ref 6.5–8.1)

## 2020-07-12 MED ORDER — SODIUM CHLORIDE 0.9% FLUSH
10.0000 mL | Freq: Once | INTRAVENOUS | Status: AC
  Administered 2020-07-12: 10 mL via INTRAVENOUS
  Filled 2020-07-12: qty 10

## 2020-07-12 MED ORDER — HEPARIN SOD (PORK) LOCK FLUSH 100 UNIT/ML IV SOLN
500.0000 [IU] | Freq: Once | INTRAVENOUS | Status: AC
Start: 2020-07-12 — End: 2020-07-12
  Administered 2020-07-12: 500 [IU] via INTRAVENOUS
  Filled 2020-07-12: qty 5

## 2020-07-12 MED ORDER — IOHEXOL 300 MG/ML  SOLN
100.0000 mL | Freq: Once | INTRAMUSCULAR | Status: AC | PRN
  Administered 2020-07-12: 100 mL via INTRAVENOUS

## 2020-07-13 ENCOUNTER — Encounter: Payer: Self-pay | Admitting: Neurosurgery

## 2020-07-13 ENCOUNTER — Inpatient Hospital Stay
Admission: RE | Admit: 2020-07-13 | Discharge: 2020-07-16 | DRG: 455 | Disposition: A | Discharge status: Home Health | Payer: Medicare Other | Attending: Neurosurgery | Admitting: Neurosurgery

## 2020-07-13 ENCOUNTER — Inpatient Hospital Stay: Payer: Medicare Other

## 2020-07-13 ENCOUNTER — Encounter
Admission: RE | Disposition: A | Discharge status: Home Health | Payer: Self-pay | Source: Home / Self Care | Attending: Neurosurgery

## 2020-07-13 ENCOUNTER — Other Ambulatory Visit: Payer: Self-pay

## 2020-07-13 ENCOUNTER — Inpatient Hospital Stay: Payer: Medicare Other | Admitting: Urgent Care

## 2020-07-13 DIAGNOSIS — M5416 Radiculopathy, lumbar region: Secondary | ICD-10-CM | POA: Diagnosis present

## 2020-07-13 DIAGNOSIS — Z85810 Personal history of malignant neoplasm of tongue: Secondary | ICD-10-CM

## 2020-07-13 DIAGNOSIS — Z85828 Personal history of other malignant neoplasm of skin: Secondary | ICD-10-CM

## 2020-07-13 DIAGNOSIS — Z87891 Personal history of nicotine dependence: Secondary | ICD-10-CM | POA: Diagnosis not present

## 2020-07-13 DIAGNOSIS — Z96641 Presence of right artificial hip joint: Secondary | ICD-10-CM | POA: Diagnosis present

## 2020-07-13 DIAGNOSIS — M48061 Spinal stenosis, lumbar region without neurogenic claudication: Principal | ICD-10-CM | POA: Diagnosis present

## 2020-07-13 DIAGNOSIS — Z79899 Other long term (current) drug therapy: Secondary | ICD-10-CM | POA: Diagnosis not present

## 2020-07-13 DIAGNOSIS — Z7984 Long term (current) use of oral hypoglycemic drugs: Secondary | ICD-10-CM | POA: Diagnosis not present

## 2020-07-13 DIAGNOSIS — Z882 Allergy status to sulfonamides status: Secondary | ICD-10-CM

## 2020-07-13 DIAGNOSIS — Z419 Encounter for procedure for purposes other than remedying health state, unspecified: Secondary | ICD-10-CM

## 2020-07-13 DIAGNOSIS — K219 Gastro-esophageal reflux disease without esophagitis: Secondary | ICD-10-CM | POA: Diagnosis present

## 2020-07-13 DIAGNOSIS — E119 Type 2 diabetes mellitus without complications: Secondary | ICD-10-CM | POA: Diagnosis present

## 2020-07-13 DIAGNOSIS — Z9221 Personal history of antineoplastic chemotherapy: Secondary | ICD-10-CM

## 2020-07-13 DIAGNOSIS — Z8249 Family history of ischemic heart disease and other diseases of the circulatory system: Secondary | ICD-10-CM | POA: Diagnosis not present

## 2020-07-13 DIAGNOSIS — N400 Enlarged prostate without lower urinary tract symptoms: Secondary | ICD-10-CM | POA: Diagnosis present

## 2020-07-13 DIAGNOSIS — M5126 Other intervertebral disc displacement, lumbar region: Secondary | ICD-10-CM | POA: Diagnosis present

## 2020-07-13 DIAGNOSIS — I480 Paroxysmal atrial fibrillation: Secondary | ICD-10-CM | POA: Diagnosis present

## 2020-07-13 DIAGNOSIS — Z833 Family history of diabetes mellitus: Secondary | ICD-10-CM

## 2020-07-13 DIAGNOSIS — I10 Essential (primary) hypertension: Secondary | ICD-10-CM | POA: Diagnosis present

## 2020-07-13 DIAGNOSIS — Z20822 Contact with and (suspected) exposure to covid-19: Secondary | ICD-10-CM | POA: Diagnosis present

## 2020-07-13 DIAGNOSIS — Z8501 Personal history of malignant neoplasm of esophagus: Secondary | ICD-10-CM

## 2020-07-13 DIAGNOSIS — E785 Hyperlipidemia, unspecified: Secondary | ICD-10-CM | POA: Diagnosis present

## 2020-07-13 DIAGNOSIS — M4316 Spondylolisthesis, lumbar region: Secondary | ICD-10-CM | POA: Diagnosis present

## 2020-07-13 DIAGNOSIS — Z923 Personal history of irradiation: Secondary | ICD-10-CM | POA: Diagnosis not present

## 2020-07-13 DIAGNOSIS — M4310 Spondylolisthesis, site unspecified: Secondary | ICD-10-CM

## 2020-07-13 HISTORY — DX: Type 2 diabetes mellitus without complications: E11.9

## 2020-07-13 HISTORY — DX: Paroxysmal atrial fibrillation: I48.0

## 2020-07-13 HISTORY — PX: MAXIMUM ACCESS (MAS) TRANSFORAMINAL LUMBAR INTERBODY FUSION (TLIF) 1 LEVEL: SHX6392

## 2020-07-13 LAB — GLUCOSE, CAPILLARY
Glucose-Capillary: 138 mg/dL — ABNORMAL HIGH (ref 70–99)
Glucose-Capillary: 135 mg/dL — ABNORMAL HIGH (ref 70–99)

## 2020-07-13 LAB — CEA: CEA: 3.6 ng/mL (ref 0.0–4.7)

## 2020-07-13 SURGERY — MAXIMUM ACCESS (MAS) TRANSFORAMINAL LUMBAR INTERBODY FUSION (TLIF) 1 LEVEL
Anesthesia: General

## 2020-07-13 MED ORDER — HEPARIN SODIUM (PORCINE) 1000 UNIT/ML IJ SOLN
INTRAMUSCULAR | Status: AC
  Filled 2020-07-13: qty 1

## 2020-07-13 MED ORDER — SODIUM CHLORIDE 0.9 % IV SOLN
INTRAVENOUS | Status: DC | PRN
  Administered 2020-07-13: 50 ug/min via INTRAVENOUS

## 2020-07-13 MED ORDER — ATORVASTATIN CALCIUM 20 MG PO TABS
80.0000 mg | ORAL_TABLET | Freq: Every day | ORAL | Status: DC
  Administered 2020-07-14 – 2020-07-16 (×3): 80 mg via ORAL
  Filled 2020-07-13 (×3): qty 4

## 2020-07-13 MED ORDER — MENTHOL 3 MG MT LOZG
1.0000 | LOZENGE | OROMUCOSAL | Status: DC | PRN
  Filled 2020-07-13: qty 9

## 2020-07-13 MED ORDER — SODIUM CHLORIDE 0.9% FLUSH
3.0000 mL | Freq: Two times a day (BID) | INTRAVENOUS | Status: DC
  Administered 2020-07-13 – 2020-07-16 (×6): 3 mL via INTRAVENOUS

## 2020-07-13 MED ORDER — BISACODYL 5 MG PO TBEC: 5.0000 mg | DELAYED_RELEASE_TABLET | Freq: Every day | ORAL | Status: DC | PRN

## 2020-07-13 MED ORDER — SODIUM CHLORIDE 0.9 % IV SOLN: 250.0000 mL | INTRAVENOUS | Status: DC

## 2020-07-13 MED ORDER — HEPARIN SODIUM (PORCINE) 1000 UNIT/ML IJ SOLN
INTRAMUSCULAR | Status: DC | PRN
  Administered 2020-07-13: 8 [IU]

## 2020-07-13 MED ORDER — ONDANSETRON HCL 4 MG/2ML IJ SOLN
INTRAMUSCULAR | Status: AC
  Filled 2020-07-13: qty 2

## 2020-07-13 MED ORDER — PHENOL 1.4 % MT LIQD
1.0000 | OROMUCOSAL | Status: DC | PRN
  Filled 2020-07-13: qty 177

## 2020-07-13 MED ORDER — MIRTAZAPINE 15 MG PO TABS
15.0000 mg | ORAL_TABLET | Freq: Every day | ORAL | Status: DC
  Administered 2020-07-13 – 2020-07-15 (×3): 15 mg via ORAL
  Filled 2020-07-13 (×3): qty 1

## 2020-07-13 MED ORDER — OXYCODONE HCL 5 MG PO TABS
ORAL_TABLET | ORAL | Status: AC
  Filled 2020-07-13: qty 1

## 2020-07-13 MED ORDER — CHLORHEXIDINE GLUCONATE 0.12 % MT SOLN: 15.0000 mL | Freq: Once | OROMUCOSAL | Status: AC

## 2020-07-13 MED ORDER — SODIUM CHLORIDE 0.9 % IV SOLN: INTRAVENOUS | Status: DC

## 2020-07-13 MED ORDER — CEFAZOLIN SODIUM-DEXTROSE 2-4 GM/100ML-% IV SOLN
2.0000 g | Freq: Once | INTRAVENOUS | Status: AC
  Administered 2020-07-13: 2 g via INTRAVENOUS

## 2020-07-13 MED ORDER — SODIUM CHLORIDE 0.9% FLUSH
3.0000 mL | INTRAVENOUS | Status: DC | PRN
  Administered 2020-07-15: 3 mL via INTRAVENOUS

## 2020-07-13 MED ORDER — METFORMIN HCL 500 MG PO TABS
500.0000 mg | ORAL_TABLET | Freq: Two times a day (BID) | ORAL | Status: DC
  Administered 2020-07-14 – 2020-07-16 (×5): 500 mg via ORAL
  Filled 2020-07-13 (×5): qty 1

## 2020-07-13 MED ORDER — SENNOSIDES-DOCUSATE SODIUM 8.6-50 MG PO TABS
1.0000 | ORAL_TABLET | Freq: Two times a day (BID) | ORAL | Status: DC
  Administered 2020-07-13 – 2020-07-16 (×5): 1 via ORAL
  Filled 2020-07-13 (×6): qty 1

## 2020-07-13 MED ORDER — FLUDROCORTISONE ACETATE 0.1 MG PO TABS
0.1000 mg | ORAL_TABLET | Freq: Every day | ORAL | Status: DC
  Administered 2020-07-14 – 2020-07-16 (×3): 0.1 mg via ORAL
  Filled 2020-07-13 (×3): qty 1

## 2020-07-13 MED ORDER — THROMBIN (RECOMBINANT) 5000 UNITS EX SOLR
CUTANEOUS | Status: AC
  Filled 2020-07-13: qty 5000

## 2020-07-13 MED ORDER — PANTOPRAZOLE SODIUM 40 MG PO TBEC
40.0000 mg | DELAYED_RELEASE_TABLET | Freq: Every day | ORAL | Status: DC
  Administered 2020-07-14 – 2020-07-16 (×3): 40 mg via ORAL
  Filled 2020-07-13 (×3): qty 1

## 2020-07-13 MED ORDER — EPHEDRINE 5 MG/ML INJ
INTRAVENOUS | Status: AC
  Filled 2020-07-13: qty 10

## 2020-07-13 MED ORDER — OXYCODONE HCL 5 MG PO TABS
5.0000 mg | ORAL_TABLET | Freq: Once | ORAL | Status: AC | PRN
  Administered 2020-07-13: 5 mg via ORAL

## 2020-07-13 MED ORDER — ACETAMINOPHEN 500 MG PO TABS
1000.0000 mg | ORAL_TABLET | Freq: Four times a day (QID) | ORAL | Status: AC
  Administered 2020-07-13 – 2020-07-14 (×3): 1000 mg via ORAL
  Filled 2020-07-13 (×4): qty 2

## 2020-07-13 MED ORDER — ONDANSETRON HCL 4 MG PO TABS: 4.0000 mg | ORAL_TABLET | Freq: Four times a day (QID) | ORAL | Status: DC | PRN

## 2020-07-13 MED ORDER — PHENYLEPHRINE HCL (PRESSORS) 10 MG/ML IV SOLN
INTRAVENOUS | Status: DC | PRN
  Administered 2020-07-13: 200 ug via INTRAVENOUS
  Administered 2020-07-13: 100 ug via INTRAVENOUS
  Administered 2020-07-13: 200 ug via INTRAVENOUS

## 2020-07-13 MED ORDER — VANCOMYCIN HCL 1000 MG IV SOLR
INTRAVENOUS | Status: DC | PRN
  Administered 2020-07-13: 1000 mg via TOPICAL

## 2020-07-13 MED ORDER — GABAPENTIN 300 MG PO CAPS
300.0000 mg | ORAL_CAPSULE | Freq: Three times a day (TID) | ORAL | Status: DC
  Administered 2020-07-13 – 2020-07-16 (×8): 300 mg via ORAL
  Filled 2020-07-13 (×8): qty 1

## 2020-07-13 MED ORDER — OXYCODONE HCL 5 MG PO TABS
5.0000 mg | ORAL_TABLET | ORAL | Status: DC | PRN
  Administered 2020-07-14: 5 mg via ORAL
  Filled 2020-07-13 (×3): qty 1

## 2020-07-13 MED ORDER — HEPARIN SODIUM (PORCINE) 10000 UNIT/ML IJ SOLN
INTRAMUSCULAR | Status: AC
  Filled 2020-07-13: qty 1

## 2020-07-13 MED ORDER — LIDOCAINE HCL (PF) 2 % IJ SOLN
INTRAMUSCULAR | Status: AC
  Filled 2020-07-13: qty 5

## 2020-07-13 MED ORDER — ONDANSETRON HCL 4 MG/2ML IJ SOLN
INTRAMUSCULAR | Status: DC | PRN
  Administered 2020-07-13: 4 mg via INTRAVENOUS

## 2020-07-13 MED ORDER — FENTANYL CITRATE (PF) 100 MCG/2ML IJ SOLN
25.0000 ug | INTRAMUSCULAR | Status: DC | PRN
  Administered 2020-07-13: 25 ug via INTRAVENOUS
  Administered 2020-07-13 (×2): 50 ug via INTRAVENOUS
  Administered 2020-07-13: 25 ug via INTRAVENOUS

## 2020-07-13 MED ORDER — CHLORHEXIDINE GLUCONATE 0.12 % MT SOLN
OROMUCOSAL | Status: AC
  Administered 2020-07-13: 15 mL via OROMUCOSAL
  Filled 2020-07-13: qty 15

## 2020-07-13 MED ORDER — ENOXAPARIN SODIUM 40 MG/0.4ML ~~LOC~~ SOLN
40.0000 mg | SUBCUTANEOUS | Status: DC
  Administered 2020-07-14 – 2020-07-16 (×3): 40 mg via SUBCUTANEOUS
  Filled 2020-07-13 (×3): qty 0.4

## 2020-07-13 MED ORDER — SUCCINYLCHOLINE CHLORIDE 200 MG/10ML IV SOSY
PREFILLED_SYRINGE | INTRAVENOUS | Status: AC
  Filled 2020-07-13: qty 10

## 2020-07-13 MED ORDER — FLEET ENEMA 7-19 GM/118ML RE ENEM: 1.0000 | ENEMA | Freq: Once | RECTAL | Status: DC | PRN

## 2020-07-13 MED ORDER — FENTANYL CITRATE (PF) 100 MCG/2ML IJ SOLN
INTRAMUSCULAR | Status: AC
  Filled 2020-07-13: qty 2

## 2020-07-13 MED ORDER — ACETAMINOPHEN 10 MG/ML IV SOLN
INTRAVENOUS | Status: DC | PRN
  Administered 2020-07-13: 1000 mg via INTRAVENOUS

## 2020-07-13 MED ORDER — NABUMETONE 500 MG PO TABS
500.0000 mg | ORAL_TABLET | Freq: Two times a day (BID) | ORAL | Status: DC
  Administered 2020-07-13 – 2020-07-16 (×6): 500 mg via ORAL
  Filled 2020-07-13 (×7): qty 1

## 2020-07-13 MED ORDER — PHENYLEPHRINE HCL (PRESSORS) 10 MG/ML IV SOLN
INTRAVENOUS | Status: AC
  Filled 2020-07-13: qty 1

## 2020-07-13 MED ORDER — VANCOMYCIN HCL 1500 MG/300ML IV SOLN
1500.0000 mg | Freq: Once | INTRAVENOUS | Status: AC
  Administered 2020-07-13: 1500 mg via INTRAVENOUS
  Filled 2020-07-13: qty 300

## 2020-07-13 MED ORDER — KETOROLAC TROMETHAMINE 15 MG/ML IJ SOLN
7.5000 mg | Freq: Four times a day (QID) | INTRAMUSCULAR | Status: AC
  Administered 2020-07-13 – 2020-07-14 (×3): 7.5 mg via INTRAVENOUS
  Filled 2020-07-13 (×3): qty 1

## 2020-07-13 MED ORDER — BUPIVACAINE-EPINEPHRINE (PF) 0.5% -1:200000 IJ SOLN
INTRAMUSCULAR | Status: AC
  Filled 2020-07-13: qty 30

## 2020-07-13 MED ORDER — LISINOPRIL 10 MG PO TABS
10.0000 mg | ORAL_TABLET | Freq: Every day | ORAL | Status: DC
  Administered 2020-07-14 – 2020-07-16 (×3): 10 mg via ORAL
  Filled 2020-07-13 (×3): qty 1

## 2020-07-13 MED ORDER — DEXMEDETOMIDINE (PRECEDEX) IN NS 20 MCG/5ML (4 MCG/ML) IV SYRINGE
PREFILLED_SYRINGE | INTRAVENOUS | Status: DC | PRN
  Administered 2020-07-13: 8 ug via INTRAVENOUS
  Administered 2020-07-13: 12 ug via INTRAVENOUS

## 2020-07-13 MED ORDER — TAMSULOSIN HCL 0.4 MG PO CAPS
0.4000 mg | ORAL_CAPSULE | Freq: Every day | ORAL | Status: DC
  Administered 2020-07-14 – 2020-07-15 (×2): 0.4 mg via ORAL
  Filled 2020-07-13 (×2): qty 1

## 2020-07-13 MED ORDER — SODIUM CHLORIDE 0.9 % IV SOLN
INTRAVENOUS | Status: DC | PRN
  Administered 2020-07-13: 40 mL

## 2020-07-13 MED ORDER — ONDANSETRON HCL 4 MG/2ML IJ SOLN
4.0000 mg | Freq: Once | INTRAMUSCULAR | Status: AC | PRN
  Administered 2020-07-13: 4 mg via INTRAVENOUS

## 2020-07-13 MED ORDER — LIDOCAINE HCL (CARDIAC) PF 100 MG/5ML IV SOSY
PREFILLED_SYRINGE | INTRAVENOUS | Status: DC | PRN
  Administered 2020-07-13: 60 mg via INTRAVENOUS
  Administered 2020-07-13: 40 mg via INTRAVENOUS

## 2020-07-13 MED ORDER — HYDROMORPHONE HCL 1 MG/ML IJ SOLN: 0.5000 mg | INTRAMUSCULAR | Status: DC | PRN

## 2020-07-13 MED ORDER — DEXMEDETOMIDINE (PRECEDEX) IN NS 20 MCG/5ML (4 MCG/ML) IV SYRINGE
PREFILLED_SYRINGE | INTRAVENOUS | Status: AC
  Filled 2020-07-13: qty 5

## 2020-07-13 MED ORDER — ACETAMINOPHEN 10 MG/ML IV SOLN
INTRAVENOUS | Status: AC
  Filled 2020-07-13: qty 100

## 2020-07-13 MED ORDER — METOPROLOL SUCCINATE ER 50 MG PO TB24
100.0000 mg | ORAL_TABLET | Freq: Every day | ORAL | Status: DC
  Administered 2020-07-14 – 2020-07-16 (×3): 100 mg via ORAL
  Filled 2020-07-13 (×3): qty 2

## 2020-07-13 MED ORDER — FENTANYL CITRATE (PF) 100 MCG/2ML IJ SOLN
INTRAMUSCULAR | Status: DC | PRN
  Administered 2020-07-13 (×2): 50 ug via INTRAVENOUS

## 2020-07-13 MED ORDER — ORAL CARE MOUTH RINSE: 15.0000 mL | Freq: Once | OROMUCOSAL | Status: AC

## 2020-07-13 MED ORDER — BUPIVACAINE-EPINEPHRINE (PF) 0.5% -1:200000 IJ SOLN
INTRAMUSCULAR | Status: DC | PRN
  Administered 2020-07-13: 9 mL

## 2020-07-13 MED ORDER — KETOROLAC TROMETHAMINE 30 MG/ML IJ SOLN
INTRAMUSCULAR | Status: AC
  Filled 2020-07-13: qty 1

## 2020-07-13 MED ORDER — OXYCODONE HCL 5 MG/5ML PO SOLN
5.0000 mg | Freq: Once | ORAL | Status: AC | PRN
Start: 2020-07-13 — End: 2020-07-13

## 2020-07-13 MED ORDER — METHOCARBAMOL 500 MG PO TABS
750.0000 mg | ORAL_TABLET | Freq: Four times a day (QID) | ORAL | Status: DC | PRN
  Administered 2020-07-15 (×3): 750 mg via ORAL
  Filled 2020-07-13 (×3): qty 2

## 2020-07-13 MED ORDER — ACETAMINOPHEN 325 MG PO TABS
650.0000 mg | ORAL_TABLET | ORAL | Status: DC | PRN
  Administered 2020-07-15: 650 mg via ORAL
  Filled 2020-07-13: qty 2

## 2020-07-13 MED ORDER — PROPOFOL 10 MG/ML IV BOLUS
INTRAVENOUS | Status: DC | PRN
  Administered 2020-07-13: 30 mg via INTRAVENOUS
  Administered 2020-07-13: 130 mg via INTRAVENOUS

## 2020-07-13 MED ORDER — CEFAZOLIN SODIUM-DEXTROSE 2-4 GM/100ML-% IV SOLN
INTRAVENOUS | Status: AC
  Filled 2020-07-13: qty 100

## 2020-07-13 MED ORDER — BUPIVACAINE HCL (PF) 0.5 % IJ SOLN
INTRAMUSCULAR | Status: DC | PRN
  Administered 2020-07-13: 20 mL

## 2020-07-13 MED ORDER — BUPIVACAINE LIPOSOME 1.3 % IJ SUSP
INTRAMUSCULAR | Status: AC
  Filled 2020-07-13: qty 20

## 2020-07-13 MED ORDER — FUROSEMIDE 20 MG PO TABS
20.0000 mg | ORAL_TABLET | Freq: Every day | ORAL | Status: DC
  Administered 2020-07-14 – 2020-07-16 (×3): 20 mg via ORAL
  Filled 2020-07-13 (×3): qty 1

## 2020-07-13 MED ORDER — OXYCODONE HCL 5 MG PO TABS
10.0000 mg | ORAL_TABLET | ORAL | Status: DC | PRN
  Administered 2020-07-15 (×2): 10 mg via ORAL
  Filled 2020-07-13 (×3): qty 2

## 2020-07-13 MED ORDER — DEXAMETHASONE SODIUM PHOSPHATE 10 MG/ML IJ SOLN
INTRAMUSCULAR | Status: DC | PRN
  Administered 2020-07-13: 5 mg via INTRAVENOUS

## 2020-07-13 MED ORDER — ONDANSETRON HCL 4 MG/2ML IJ SOLN: 4.0000 mg | Freq: Four times a day (QID) | INTRAMUSCULAR | Status: DC | PRN

## 2020-07-13 MED ORDER — VANCOMYCIN HCL 1000 MG IV SOLR
INTRAVENOUS | Status: AC
  Filled 2020-07-13: qty 1000

## 2020-07-13 MED ORDER — SODIUM CHLORIDE FLUSH 0.9 % IV SOLN
INTRAVENOUS | Status: AC
  Filled 2020-07-13: qty 20

## 2020-07-13 MED ORDER — ACETAMINOPHEN 650 MG RE SUPP: 650.0000 mg | RECTAL | Status: DC | PRN

## 2020-07-13 MED ORDER — EPHEDRINE SULFATE 50 MG/ML IJ SOLN
INTRAMUSCULAR | Status: DC | PRN
  Administered 2020-07-13: 15 mg via INTRAVENOUS
  Administered 2020-07-13: 10 mg via INTRAVENOUS

## 2020-07-13 MED ORDER — KETOROLAC TROMETHAMINE 30 MG/ML IJ SOLN
INTRAMUSCULAR | Status: DC | PRN
  Administered 2020-07-13: 30 mg via INTRAVENOUS

## 2020-07-13 MED ORDER — POTASSIUM CHLORIDE IN NACL 20-0.9 MEQ/L-% IV SOLN
INTRAVENOUS | Status: DC
  Filled 2020-07-13 (×9): qty 1000

## 2020-07-13 MED ORDER — DEXAMETHASONE SODIUM PHOSPHATE 10 MG/ML IJ SOLN
INTRAMUSCULAR | Status: AC
  Filled 2020-07-13: qty 1

## 2020-07-13 MED ORDER — SUCCINYLCHOLINE CHLORIDE 20 MG/ML IJ SOLN
INTRAMUSCULAR | Status: DC | PRN
  Administered 2020-07-13: 100 mg via INTRAVENOUS

## 2020-07-13 MED ORDER — PROMETHAZINE HCL 25 MG PO TABS
12.5000 mg | ORAL_TABLET | Freq: Four times a day (QID) | ORAL | Status: DC | PRN
  Filled 2020-07-13: qty 1

## 2020-07-13 MED ORDER — BUPIVACAINE HCL (PF) 0.5 % IJ SOLN
INTRAMUSCULAR | Status: AC
  Filled 2020-07-13: qty 30

## 2020-07-13 SURGICAL SUPPLY — 63 items
BONE CANC CHIPS 20CC PCAN1/4 (Bone Implant) ×2 IMPLANT
BUR NEURO DRILL SOFT 3.0X3.8M (BURR) ×2 IMPLANT
CAP LOCKING THREADED (Cap) ×8 IMPLANT
CHIPS CANC BONE 20CC PCAN1/4 (Bone Implant) ×1 IMPLANT
CHLORAPREP W/TINT 26 (MISCELLANEOUS) ×4 IMPLANT
CNTNR SPEC 2.5X3XGRAD LEK (MISCELLANEOUS) ×1
CONT SPEC 4OZ STER OR WHT (MISCELLANEOUS) ×1
CONTAINER SPEC 2.5X3XGRAD LEK (MISCELLANEOUS) ×1 IMPLANT
COVER WAND RF STERILE (DRAPES) ×2 IMPLANT
CUP MEDICINE 2OZ PLAST GRAD ST (MISCELLANEOUS) ×4 IMPLANT
DERMABOND ADVANCED (GAUZE/BANDAGES/DRESSINGS) ×1
DERMABOND ADVANCED .7 DNX12 (GAUZE/BANDAGES/DRESSINGS) ×1 IMPLANT
DRAPE C ARM PK CFD 31 SPINE (DRAPES) ×2 IMPLANT
DRAPE C-ARMOR (DRAPES) ×2 IMPLANT
DRAPE INCISE IOBAN 66X45 STRL (DRAPES) ×2 IMPLANT
DRAPE LAPAROTOMY 100X77 ABD (DRAPES) ×2 IMPLANT
DRAPE SURG 17X11 SM STRL (DRAPES) ×8 IMPLANT
DRSG OPSITE POSTOP 4X8 (GAUZE/BANDAGES/DRESSINGS) IMPLANT
DRSG TEGADERM 4X4.75 (GAUZE/BANDAGES/DRESSINGS) IMPLANT
ELECT CAUTERY BLADE TIP 2.5 (TIP) ×2
ELECTRODE CAUTERY BLDE TIP 2.5 (TIP) ×1 IMPLANT
FEE INTRAOP MONITOR IMPULS NCS (MISCELLANEOUS) IMPLANT
GAUZE XEROFORM 4X4 STRL (GAUZE/BANDAGES/DRESSINGS) IMPLANT
GLOVE SURG ENC MOIS LTX SZ6.5 (GLOVE) IMPLANT
GLOVE SURG SYN 7.0 (GLOVE) ×2 IMPLANT
GLOVE SURG SYN 8.5  E (GLOVE) ×3
GLOVE SURG SYN 8.5 E (GLOVE) ×3 IMPLANT
GLOVE SURG UNDER POLY LF SZ7 (GLOVE) IMPLANT
GOWN STRL REUS W/ TWL XL LVL3 (GOWN DISPOSABLE) ×1 IMPLANT
GOWN STRL REUS W/TWL XL LVL3 (GOWN DISPOSABLE) ×1
GRADUATE 1200CC STRL 31836 (MISCELLANEOUS) ×2 IMPLANT
HEMOVAC 400CC 10FR (MISCELLANEOUS) ×2 IMPLANT
INTERBODY SABLE 10X26 7-14 15D (Miscellaneous) ×2 IMPLANT
INTRAOP MONITOR FEE IMPULS NCS (MISCELLANEOUS)
INTRAOP MONITOR FEE IMPULSE (MISCELLANEOUS)
KIT ASPI BONE MRW 60ML (KITS) ×2 IMPLANT
KIT PREVENA INCISION MGT 13 (CANNISTER) ×2 IMPLANT
KIT SPINAL PRONEVIEW (KITS) ×2 IMPLANT
KNIFE BAYONET SHORT DISCETOMY (MISCELLANEOUS) IMPLANT
MANIFOLD NEPTUNE II (INSTRUMENTS) ×2 IMPLANT
MILL MEDIUM DISP (BLADE) IMPLANT
NDL SAFETY ECLIPSE 18X1.5 (NEEDLE) ×1 IMPLANT
NEEDLE HYPO 18GX1.5 SHARP (NEEDLE) ×1
NEEDLE HYPO 22GX1.5 SAFETY (NEEDLE) ×2 IMPLANT
PACK LAMINECTOMY NEURO (CUSTOM PROCEDURE TRAY) ×2 IMPLANT
PUTTY DBX 10CC (Bone Implant) ×2 IMPLANT
ROD CREO 45MM SPINAL (Rod) ×4 IMPLANT
SCREW CREO SPINAL 6.5X45 (Screw) ×8 IMPLANT
SPOGE SURGIFLO 8M (HEMOSTASIS) ×1
SPONGE DRAIN TRACH 4X4 STRL 2S (GAUZE/BANDAGES/DRESSINGS) IMPLANT
SPONGE SURGIFLO 8M (HEMOSTASIS) ×1 IMPLANT
STAPLER SKIN PROX 35W (STAPLE) IMPLANT
SUT DVC VLOC 3-0 CL 6 P-12 (SUTURE) ×2 IMPLANT
SUT ETHILON 3-0 FS-10 30 BLK (SUTURE) ×2
SUT VIC AB 0 CT1 27 (SUTURE) ×2
SUT VIC AB 0 CT1 27XCR 8 STRN (SUTURE) ×2 IMPLANT
SUTURE EHLN 3-0 FS-10 30 BLK (SUTURE) ×1 IMPLANT
SYR 20ML LL LF (SYRINGE) ×2 IMPLANT
SYR 30ML LL (SYRINGE) ×4 IMPLANT
SYR 3ML LL SCALE MARK (SYRINGE) ×2 IMPLANT
TOWEL OR 17X26 4PK STRL BLUE (TOWEL DISPOSABLE) ×6 IMPLANT
TRAY FOLEY SLVR 16FR LF STAT (SET/KITS/TRAYS/PACK) ×2 IMPLANT
TUBING CONNECTING 10 (TUBING) ×2 IMPLANT

## 2020-07-13 NOTE — Progress Notes (Signed)
Patient is doing well in the recovery room. Pain medication given according to orders and patient pain level. Patient grip strength equal bilaterally moderate, no facial droop, speech clear, resting comfortably in bed at this time. Will continue to monitor patient.

## 2020-07-13 NOTE — Progress Notes (Signed)
Patient wife notified of room number and that patient is doing great.

## 2020-07-13 NOTE — Plan of Care (Addendum)
Patient had an uneventful shift. Pain controlled with scheduled medications. Vital signs within normal range. Patient reports decrease in range of motion to LUE this morning. Foley removed @ 6 am as ordered.Denies any needs at this time. All Safety measures maintained. Care continues. Problem: Education: Goal: Knowledge of General Education information will improve Description: Including pain rating scale, medication(s)/side effects and non-pharmacologic comfort measures 07/13/2020 2015 by Windell Norfolk, RN Outcome: Progressing 07/13/2020 2014 by Windell Norfolk, RN Outcome: Progressing   Problem: Health Behavior/Discharge Planning: Goal: Ability to manage health-related needs will improve 07/13/2020 2015 by Windell Norfolk, RN Outcome: Progressing 07/13/2020 2014 by Windell Norfolk, RN Outcome: Progressing   Problem: Clinical Measurements: Goal: Ability to maintain clinical measurements within normal limits will improve 07/13/2020 2015 by Windell Norfolk, RN Outcome: Progressing 07/13/2020 2014 by Windell Norfolk, RN Outcome: Progressing Goal: Will remain free from infection 07/13/2020 2015 by Windell Norfolk, RN Outcome: Progressing 07/13/2020 2014 by Windell Norfolk, RN Outcome: Progressing Goal: Diagnostic test results will improve 07/13/2020 2015 by Windell Norfolk, RN Outcome: Progressing 07/13/2020 2014 by Windell Norfolk, RN Outcome: Progressing Goal: Respiratory complications will improve 07/13/2020 2015 by Windell Norfolk, RN Outcome: Progressing 07/13/2020 2014 by Windell Norfolk, RN Outcome: Progressing Goal: Cardiovascular complication will be avoided 07/13/2020 2015 by Windell Norfolk, RN Outcome: Progressing 07/13/2020 2014 by Windell Norfolk, RN Outcome: Progressing   Problem: Activity: Goal: Risk for activity intolerance will decrease 07/13/2020 2015 by Windell Norfolk, RN Outcome: Progressing 07/13/2020 2014 by Windell Norfolk, RN Outcome:  Progressing   Problem: Nutrition: Goal: Adequate nutrition will be maintained 07/13/2020 2015 by Windell Norfolk, RN Outcome: Progressing 07/13/2020 2014 by Windell Norfolk, RN Outcome: Progressing   Problem: Coping: Goal: Level of anxiety will decrease 07/13/2020 2015 by Windell Norfolk, RN Outcome: Progressing 07/13/2020 2014 by Windell Norfolk, RN Outcome: Progressing   Problem: Elimination: Goal: Will not experience complications related to bowel motility 07/13/2020 2015 by Windell Norfolk, RN Outcome: Progressing 07/13/2020 2014 by Windell Norfolk, RN Outcome: Progressing Goal: Will not experience complications related to urinary retention 07/13/2020 2015 by Windell Norfolk, RN Outcome: Progressing 07/13/2020 2014 by Windell Norfolk, RN Outcome: Progressing   Problem: Pain Managment: Goal: General experience of comfort will improve 07/13/2020 2015 by Windell Norfolk, RN Outcome: Progressing 07/13/2020 2014 by Windell Norfolk, RN Outcome: Progressing   Problem: Safety: Goal: Ability to remain free from injury will improve 07/13/2020 2015 by Windell Norfolk, RN Outcome: Progressing 07/13/2020 2014 by Windell Norfolk, RN Outcome: Progressing   Problem: Skin Integrity: Goal: Risk for impaired skin integrity will decrease 07/13/2020 2015 by Windell Norfolk, RN Outcome: Progressing 07/13/2020 2014 by Windell Norfolk, RN Outcome: Progressing   Problem: Education: Goal: Ability to verbalize activity precautions or restrictions will improve 07/13/2020 2015 by Windell Norfolk, RN Outcome: Progressing 07/13/2020 2014 by Windell Norfolk, RN Outcome: Progressing Goal: Knowledge of the prescribed therapeutic regimen will improve 07/13/2020 2015 by Windell Norfolk, RN Outcome: Progressing 07/13/2020 2014 by Windell Norfolk, RN Outcome: Progressing Goal: Understanding of discharge needs will improve 07/13/2020 2015 by Windell Norfolk, RN Outcome:  Progressing 07/13/2020 2014 by Windell Norfolk, RN Outcome: Progressing   Problem: Activity: Goal: Ability to avoid complications of mobility impairment will improve 07/13/2020 2015 by Windell Norfolk, RN Outcome: Progressing 07/13/2020 2014 by Windell Norfolk, RN Outcome: Progressing Goal: Ability to  tolerate increased activity will improve 07/13/2020 2015 by Windell Norfolk, RN Outcome: Progressing 07/13/2020 2014 by Windell Norfolk, RN Outcome: Progressing Goal: Will remain free from falls 07/13/2020 2015 by Windell Norfolk, RN Outcome: Progressing 07/13/2020 2014 by Windell Norfolk, RN Outcome: Progressing   Problem: Bowel/Gastric: Goal: Gastrointestinal status for postoperative course will improve 07/13/2020 2015 by Windell Norfolk, RN Outcome: Progressing 07/13/2020 2014 by Windell Norfolk, RN Outcome: Progressing   Problem: Clinical Measurements: Goal: Ability to maintain clinical measurements within normal limits will improve 07/13/2020 2015 by Windell Norfolk, RN Outcome: Progressing 07/13/2020 2014 by Windell Norfolk, RN Outcome: Progressing Goal: Postoperative complications will be avoided or minimized 07/13/2020 2015 by Windell Norfolk, RN Outcome: Progressing 07/13/2020 2014 by Windell Norfolk, RN Outcome: Progressing Goal: Diagnostic test results will improve 07/13/2020 2015 by Windell Norfolk, RN Outcome: Progressing 07/13/2020 2014 by Windell Norfolk, RN Outcome: Progressing   Problem: Pain Management: Goal: Pain level will decrease 07/13/2020 2015 by Windell Norfolk, RN Outcome: Progressing 07/13/2020 2014 by Windell Norfolk, RN Outcome: Progressing   Problem: Skin Integrity: Goal: Will show signs of wound healing 07/13/2020 2015 by Windell Norfolk, RN Outcome: Progressing 07/13/2020 2014 by Windell Norfolk, RN Outcome: Progressing   Problem: Health Behavior/Discharge Planning: Goal: Identification of resources available to assist in  meeting health care needs will improve 07/13/2020 2015 by Windell Norfolk, RN Outcome: Progressing 07/13/2020 2014 by Windell Norfolk, RN Outcome: Progressing   Problem: Bladder/Genitourinary: Goal: Urinary functional status for postoperative course will improve 07/13/2020 2015 by Windell Norfolk, RN Outcome: Progressing 07/13/2020 2014 by Windell Norfolk, RN Outcome: Progressing

## 2020-07-13 NOTE — Anesthesia Preprocedure Evaluation (Addendum)
Anesthesia Evaluation  Patient identified by MRN, date of birth, ID band Patient awake    Reviewed: Allergy & Precautions, H&P , NPO status , Patient's Chart, lab work & pertinent test results  History of Anesthesia Complications Negative for: history of anesthetic complications  Airway Mallampati: II  TM Distance: >3 FB Neck ROM: limited    Dental   Pulmonary shortness of breath, neg sleep apnea, neg COPD, former smoker,    breath sounds clear to auscultation       Cardiovascular hypertension, (-) angina+ CAD  (-) Past MI and (-) Cardiac Stents + dysrhythmias Atrial Fibrillation and Supra Ventricular Tachycardia  Rhythm:regular Rate:Normal  Carotid artery stenosis   Neuro/Psych Lumbar radiculopathy negative neurological ROS  negative psych ROS   GI/Hepatic Neg liver ROS, GERD  Controlled,H/o esophageal CA s/p radiation, chemo   Endo/Other  diabetes  Renal/GU      Musculoskeletal   Abdominal   Peds  Hematology negative hematology ROS (+)   Anesthesia Other Findings Past Medical History: No date: Arthritis No date: Back pain     Comment:  with leg pain 09/04/2009: Basal cell carcinoma     Comment:  R cheek 5.5 cm ant to earlobe - 12/26/2009 excision 09/04/2009: Basal cell carcinoma     Comment:  R ant nasal alar rim 07/31/2014: Basal cell carcinoma     Comment:  R mid brow 07/30/2016: Basal cell carcinoma     Comment:  R ant nasal alar rim at ant edge of BCC scar 11/02/2019: Basal cell carcinoma     Comment:  L zygoma  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)     Comment:  s/p chemoradiation No date: 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,  bilateral No date: Dyspnea No date: Dysrhythmia 2021: Esophageal cancer (HCC) No date: GERD (gastroesophageal reflux disease) No date: Hyperlipidemia No date: Hypertension No date: Numbness and tingling of both lower extremities     Comment:  with  positioning No date: Paroxysmal atrial fibrillation ([place]) 2006: Port-A-Cath in place No date: Prostate enlargement 12/14/2019: Squamous cell carcinoma of skin     Comment:  L pretibial - ED&C  No date: T2DM (type 2 diabetes mellitus) ([place])  Past Surgical History: No date: ABDOMINAL SURGERY 12/08/2003: COLONOSCOPY 03/21/2019: COLONOSCOPY WITH PROPOFOL; N/A     Comment:  Procedure: COLONOSCOPY WITH PROPOFOL;  Surgeon: [place],               Benay Pike, MD;  Location: [facility] ENDOSCOPY;  Service:               Gastroenterology;  Laterality: N/A; 03/21/2019: ESOPHAGOGASTRODUODENOSCOPY (EGD) WITH PROPOFOL; N/A     Comment:  Procedure: ESOPHAGOGASTRODUODENOSCOPY (EGD) WITH               PROPOFOL;  Surgeon: Toledo, Benay Pike, MD;  Location:               [facility] ENDOSCOPY;  Service: Gastroenterology;  Laterality:               N/A; No date: esophogeal cancer No date: JOINT REPLACEMENT; Right     Comment:  total hip 03/03/2018: LUMBAR LAMINECTOMY/DECOMPRESSION MICRODISCECTOMY; N/A     Comment:  Procedure: LUMBAR LAMINECTOMY/DECOMPRESSION               MICRODISCECTOMY 1 LEVEL-L3-4,L4-5;  Surgeon: Meade Maw, MD;  Location: [facility] ORS;  Service: Neurosurgery;  Laterality: N/A; 02/24/2020: LUMBAR LAMINECTOMY/DECOMPRESSION MICRODISCECTOMY; N/A     Comment:  Procedure: LEFT L3-4 MICRODISCECTOMY, L4-5               DECOMPRESSION;  Surgeon: Meade Maw, MD;                Location: [facility] ORS;  Service: Neurosurgery;  Laterality:               N/A; 2014: PARTIAL HIP ARTHROPLASTY; Right No date: PORT-A-CATH REMOVAL 01/12/2020: PORTACATH PLACEMENT; Left     Comment:  Procedure: INSERTION PORT-A-CATH, with ultrasound               fluoroscopy;  Surgeon: Nestor Lewandowsky, MD;  Location: [facility]              ORS;  Service: General;  Laterality: Left; 2006: TONGUE BIOPSY No date: TONSILLECTOMY 2013: [place]; Right 01/25/14: UPPER GI ENDOSCOPY     Comment:   multiple gastric polyps  BMI    Body Mass Index: 23.30 kg/m      Reproductive/Obstetrics negative OB ROS                            Anesthesia Physical Anesthesia Plan  ASA: III  Anesthesia Plan: General ETT   Post-op Pain Management:    Induction:   PONV Risk Score and Plan: Ondansetron, Dexamethasone and Treatment may vary due to age or medical condition  Airway Management Planned:   Additional Equipment:   Intra-op Plan:   Post-operative Plan:   Informed Consent: I have reviewed the patients History and Physical, chart, labs and discussed the procedure including the risks, benefits and alternatives for the proposed anesthesia with the patient or authorized representative who has indicated his/her understanding and acceptance.     Dental Advisory Given  Plan Discussed with: Anesthesiologist, CRNA and Surgeon  Anesthesia Plan Comments:         Anesthesia Quick Evaluation

## 2020-07-13 NOTE — Anesthesia Procedure Notes (Signed)
Procedure Name: Intubation Date/Time: 07/13/2020 12:20 PM Performed by: Jonna Clark, CRNA Pre-anesthesia Checklist: Patient identified, Patient being monitored, Timeout performed, Emergency Drugs available and Suction available Patient Re-evaluated:Patient Re-evaluated prior to induction Oxygen Delivery Method: Circle system utilized Preoxygenation: Pre-oxygenation with 100% oxygen Induction Type: IV induction Ventilation: Mask ventilation without difficulty Laryngoscope Size: McGraph and 4 Grade View: Grade II Tube type: Oral Tube size: 7.5 mm Number of attempts: 1 Airway Equipment and Method: Stylet Placement Confirmation: ETT inserted through vocal cords under direct vision,  positive ETCO2 and breath sounds checked- equal and bilateral Secured at: 21 cm Tube secured with: Tape Dental Injury: Teeth and Oropharynx as per pre-operative assessment

## 2020-07-13 NOTE — Op Note (Signed)
Indications: Mr. Friesen presented with spondylolisthesis of the lumbar region as well as right leg weakness.  He failed conservative management.  Findings: large disc herniation L3-4  Preoperative Diagnosis: Spondylolisthesis of the lumbar region, right leg weakness. Postoperative Diagnosis: same   EBL: 100 ml IVF: 1000 ml Drains: 1 placed Disposition: Extubated and Stable to PACU Complications: none  No foley catheter was placed.   Preoperative Note:   Risks of surgery discussed include: infection, bleeding, stroke, coma, death, paralysis, CSF leak, nerve/spinal cord injury, numbness, tingling, weakness, complex regional pain syndrome, recurrent stenosis and/or disc herniation, vascular injury, development of instability, neck/back pain, need for further surgery, persistent symptoms, development of deformity, and the risks of anesthesia. The patient understood these risks and agreed to proceed.  Operative Note:  1. Transforaminal Lumbar Interbody Fusion L3/4 2. Posterolateral arthrodesis L3 to L4 3. Posterior nonsegmental instrumentation L3 to L4 4. Lumbar decompression including central decompression, bilateral medial facetectomies, and bilateral foraminotomies at L3/4 5. Harvesting of autograft via the same incision 6. Placement of a biomechanical device ([place]) at L3/4 for anterior arthrodesis    The patient was brought to the Operating Room, intubated and turned into the prone position. All pressure points were checked and double checked. Flouroscopy was used to mark the incision. The patient was prepped and draped in the standard fashion. A full timeout was performed. Preoperative antibiotics were given. The incision was injected with local anesthetic.  The incision was opened with a scalpel, then the soft tissues divided with the Bovie. Self-retaining retractors were placed. The paraspinus muscles were reflected laterally in subperiosteal fashion until the transverse  processes were visible. Flouroscopy was used to confirm our localization.  The left iliac crest was accessed via a separate incision.  60 ml bone marrow was aspirated.   The self-retaining retractors were repositioned. We then placed pedicle screws.  At L3 on one side, a starting point was chosen based on anatomic landmarks, then breached with a high speed drill. A pedicle finder probe was used to cannulate the pedicle, then the balltip probe used to confirm lack of breach. The tract was tapped, re-checked with the balltip probe, then a 6.5 x 45 mm pedicle screw was placed. The procedure was then repeated contralaterally and the same size screw placed. At L4 on one side, a starting point was chosen based on anatomic landmarks, then breached with a high speed drill. A pedicle finder probe was used to cannulate the pedicle, then the balltip probe used to confirm lack of breach. The tract was tapped, re-checked with the balltip probe, then a 6.5 x 45 mm pedicle screw was placed. The procedure was then repeated contralaterally and the same size screw placed.  After placement of pedicle screws, a screw-to-screw distractor was placed to distract the disc space. We then turned attention to performing the transforaminal decompression and interbody fusion. The left L3/4 facet was removed with osteotomes and the drill, and handed off for preparation as autograft. The traversing and exiting nerve roots on the left were identified and protected. The disc was opened using a scalpel. After incising the disc space, we took a combination of pituitary rongeurs, Kerrison rongeurs, disc scrapers, and curettes to remove a majority of the disc material.  We prepared the end plates for accepting the interbody fusion.  We removed the cartilaginous plate, preserved the cortical endplate if possible during this procedure.  We serially dilated up in order to increase the size of the disc space, while protecting  the traversing and  exiting nerve roots, until we had sized up to a trial.    The trial was removed, and the disc space packed with autograft and allograft. The Globus Sable TLIF biomechanical device was packed with a mixture of allograft and autograft and then inserted, with care taken to protect the nerve roots and thecal sac. After placement of the device, the screw-screw distractor was removed.  After placement of the biomechanical device, additional compression of the neural elements was noted. To decompress the neural elements at L3/4, the drill was used to extend the laminoforaminotomy laterally until the traversing and exiting nerve roots were fully decompressed.  Additionally, the drill was used to removed the base of the spinous process and the contralateral lamina until the contralateral medial facet was identified and palpated. Using a [facility] and curettes, the ligamentum flavum at L3/4 was dissected completely free of the dura. Using punches and curettes, the ligamentum flavum was removed in its entirety at L3/4 for a complete decompression from facet to facet.  The contralateral L4 nerve root was palpated, and the Kerrison punch used to remove the remainder of the medial facet and foramen until the dural sac and nerve roots were complete decompressed.  Rods were measured to length, cut, and shaped. The rods were secured using locking caps to manufacturer's specifications. Final AP and lateral radiographs were taken to confirm placement of instrumentation and appropriate alignment. The wound was copiously irrigated, then the external surfaces of the remaining lamina, facet, and transverse processes from L3 to L4 were decorticated. A mixture of allograft and autograft was placed over the decorticated surfaces for arthrodesis.  A drain was placed subfascially.   After hemostasis, the wound was closed in layers with 0 and 2-0 vicryl. 3-0 monocryl and a wound vac was applied to the incision.  The patient  was then flipped supine and extubated with incident. All counts were correct times 2 at the end of the case. No immediate complications were noted.  Liliane Bade PA assisted in the case.  Meade Maw MD

## 2020-07-13 NOTE — H&P (Signed)
  I have reviewed and confirmed my history and physical from 06/21/2020 with no additions or changes. Plan for L3-4 TLIF.  Risks and benefits reviewed.  Heart sounds normal no MRG. Chest Clear to Auscultation Bilaterally.

## 2020-07-13 NOTE — Transfer of Care (Signed)
Immediate Anesthesia Transfer of Care Note  Patient: Blake [name]  Procedure(s) Performed: OPEN L3-4 TRANSFORAMINAL LUMBAR INTERBODY FUSION (TLIF) (N/A ) BONE MARROW ASPIRATION FOR SPINE FUSION ONLY (BMAC) (N/A )  Patient Location: PACU  Anesthesia Type:General  Level of Consciousness: drowsy and patient cooperative  Airway & Oxygen Therapy: Patient Spontanous Breathing and Patient connected to face mask oxygen  Post-op Assessment: Report given to RN and Post -op Vital signs reviewed and stable  Post vital signs: Reviewed and stable  Last Vitals:  Vitals Value Taken Time  BP 109/59 07/13/20 1545  Temp    Pulse 74 07/13/20 1545  Resp 19 07/13/20 1545  SpO2 99 % 07/13/20 1545  Vitals shown include unvalidated device data.  Last Pain:  Vitals:   07/13/20 1118  TempSrc: Temporal  PainSc: 0-No pain      Patients Stated Pain Goal: 0 (21/58/72 7618)  Complications: No complications documented.

## 2020-07-14 ENCOUNTER — Inpatient Hospital Stay: Payer: Medicare Other

## 2020-07-14 NOTE — Consult Note (Signed)
ORTHOPAEDIC CONSULTATION  REQUESTING PHYSICIAN: Meade Maw, MD  Chief Complaint:   L >R shoulder pain  History of Present Illness: Blake [name] is a 75 y.o. male Who is now postoperative day #1 s/p L3-4 TLIF by Dr. Izora Ribas.  He woke up earlier this morning with significant difficulty actively elevating his left shoulder with sensations of catching that he had not experienced before.  He states that preoperatively, he has had bilateral shoulder pain.  He does note that for prior MRIs where he has had to hold his arms over his head for approximately 20 minutes, his shoulders would ache.  Intraoperatively, he was positioned with arms overhead for approximately 3 hours.  He states that since this morning his left shoulder has improved, although it is still sore and has more limitation range of motion compared to preoperative status.  He also notes that his right shoulder also aches, but is not as significant as the left.  Currently, he does not feel limited in terms of mobilization or use of walker due to shoulder pain.  Past Medical History:  Diagnosis Date  . Arthritis   . Back pain    with leg pain  . Basal cell carcinoma 09/04/2009   R cheek 5.5 cm ant to earlobe - 12/26/2009 excision  . Basal cell carcinoma 09/04/2009   R ant nasal alar rim  . Basal cell carcinoma 07/31/2014   R mid brow  . Basal cell carcinoma 07/30/2016   R ant nasal alar rim at ant edge of BCC scar  . Basal cell carcinoma 11/02/2019   L zygoma   . 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 Dyspnea   . Dysrhythmia   . Esophageal cancer ([place]) 2021  . GERD (gastroesophageal reflux disease)   . Hyperlipidemia   . Hypertension   . Numbness and tingling of both lower extremities    with positioning  . Paroxysmal atrial fibrillation (HCC)   . Port-A-Cath in  place 2006  . Prostate enlargement   . Squamous cell carcinoma of skin 12/14/2019   L pretibial - ED&C   . T2DM (type 2 diabetes mellitus) ([place])    Past Surgical History:  Procedure Laterality Date  . ABDOMINAL SURGERY    . COLONOSCOPY  12/08/2003  . COLONOSCOPY WITH PROPOFOL N/A 03/21/2019   Procedure: COLONOSCOPY WITH PROPOFOL;  Surgeon: Toledo, Benay Pike, MD;  Location: [facility]COPY;  Service: Gastroenterology;  Laterality: N/A;  . ESOPHAGOGASTRODUODENOSCOPY (EGD) WITH PROPOFOL N/A 03/21/2019   Procedure: ESOPHAGOGASTRODUODENOSCOPY (EGD) WITH PROPOFOL;  Surgeon: Toledo, Benay Pike, MD;  Location: [facility] END[facility]COPY;  Service: Gastroenterology;  Laterality: N/A;  . esophogeal cancer    . JOINT REPLACEMENT Right    total hip  . LUMBAR LAMINECTOMY/DECOMPRESSION MICRODISCECTOMY N/A 03/03/2018   Procedure: LUMBAR LAMINECTOMY/DECOMPRESSION MICRODISCECTOMY 1 LEVEL-L3-4,L4-5;  Surgeon: Meade Maw, MD;  Location: [facility] ORS;  Service: Neurosurgery;  Laterality: N/A;  . LUMBAR LAMINECTOMY/DECOMPRESSION MICRODISCECTOMY N/A 02/24/2020   Procedure: LEFT L3-4 MICRODISCECTOMY, L4-5 DECOMPRESSION;  Surgeon: Meade Maw, MD;  Location: [facility] ORS;  Service: Neurosurgery;  Laterality: N/A;  . PARTIAL HIP ARTHROPLASTY Right 2014  . PORT-A-CATH REMOVAL    . PORTACATH PLACEMENT Left 01/12/2020   Procedure: INSERTION PORT-A-CATH, with ultrasound fluoroscopy;  Surgeon: Nestor Lewandowsky, MD;  Location: [facility] ORS;  Service: General;  Laterality: Left;  . TONGUE BIOPSY  2006  . TONSILLECTOMY    . TRIGGER FINGER RELEASE Right 2013  .  UPPER GI ENDOSCOPY  01/25/14   multiple gastric polyps   Social History   Socioeconomic History  . Marital status: Married    Spouse name: Not on file  . Number of children: Not on file  . Years of education: Not on file  . Highest education level: Not on file  Occupational History  . Not on file  Tobacco Use  . Smoking status: Former Smoker    Packs/day: 1.00     Years: 30.00    Pack years: 30.00    Types: Cigarettes    Quit date: 02/18/1994    Years since quitting: 26.4  . Smokeless tobacco: Never Used  Vaping Use  . Vaping Use: Never used  Substance and Sexual Activity  . Alcohol use: Not Currently  . Drug use: No  . Sexual activity: Yes  Other Topics Concern  . Not on file  Social History Narrative  . Not on file   Social Determinants of Health   Financial Resource Strain: Not on file  Food Insecurity: Not on file  Transportation Needs: Not on file  Physical Activity: Not on file  Stress: Not on file  Social Connections: Not on file   Family History  Problem Relation Age of Onset  . Diabetes Mother   . Congestive Heart Failure Mother   . Breast cancer Mother   . Lung cancer Father    Allergies  Allergen Reactions  . Sulfa Antibiotics Rash   Prior to Admission medications   Medication Sig Start Date End Date Taking? Authorizing Provider  lisinopril (ZESTRIL) 10 MG tablet TAKE 1 TABLET (10 MG TOTAL) BY MOUTH DAILY. 06/27/20 06/27/21 Yes Oswald Hillock, MD  metFORMIN (GLUCOPHAGE) 500 MG tablet TAKE 1 TABLET BY MOUTH TWICE DAILY WITH MEALS 08/17/19 08/16/20 Yes Idelle Crouch, MD  metoprolol succinate (TOPROL-XL) 100 MG 24 hr tablet Take 100 mg by mouth daily.   Yes [provider]  mirtazapine (REMERON) 15 MG tablet Take 1 tablet (15 mg total) by mouth nightly 07/02/20  Yes   nabumetone (RELAFEN) 500 MG tablet Take 500 mg by mouth 2 (two) times daily. 04/23/20  Yes [provider]  nabumetone (RELAFEN) 500 MG tablet TAKE 1 TABLET BY MOUTH 2 TIMES DAILY 04/17/20 04/17/21 Yes Idelle Crouch, MD  omeprazole (PRILOSEC) 40 MG capsule Take 40 mg by mouth 2 (two) times daily.   Yes [provider]  promethazine (PHENERGAN) 25 MG tablet TAKE 1 TABLET BY MOUTH EVERY 6 HOURS AS NEEDED 04/17/20 04/17/21 Yes Sparks, Leonie Douglas, MD  tamsulosin (FLOMAX) 0.4 MG CAPS capsule Take 0.4 mg by mouth daily after supper.  09/01/16   Yes [provider]  atorvastatin (LIPITOR) 80 MG tablet TAKE 1 TABLET BY MOUTH ONCE DAILY 02/13/20 02/12/21  Pricilla Larsson, NP  cefUROXime (CEFTIN) 500 MG tablet TAKE 1 TABLET BY MOUTH TWICE DAILY FOR 10 DAYS Patient taking differently: Take by mouth 2 (two) times daily. for 10 days 02/06/20 02/05/21  Idelle Crouch, MD  fludrocortisone (FLORINEF) 0.1 MG tablet TAKE 1 TABLET BY MOUTH ONCE DAILY 04/23/20 04/23/21  Idelle Crouch, MD  furosemide (LASIX) 20 MG tablet TAKE 1 TABLET (20 MG TOTAL) BY MOUTH DAILY. 06/27/20 06/27/21  Oswald Hillock, MD  gabapentin (NEURONTIN) 300 MG capsule TAKE 1 CAPSULE (300 MG TOTAL) BY MOUTH 3 (THREE) TIMES DAILY 02/03/20 02/02/21  Idelle Crouch, MD  HYDROcodone-acetaminophen (NORCO/VICODIN) 5-325 MG tablet TAKE 1 TABLET BY MOUTH EVERY 6 HOURS AS NEEDED FOR PAIN  FOR UP TO 30 DAYS 04/17/20 10/14/20  Idelle Crouch, MD  lidocaine-prilocaine (EMLA) cream APPLY TO AFFECTED AREA ONCE AS DIRECTED 12/30/19 12/29/20  Earlie Server, MD  methocarbamol (ROBAXIN) 500 MG tablet TAKE 1 TABLET BY MOUTH EVERY 6 HOURS AS NEEDED FOR MUSCLE SPASMS. 02/24/20 02/23/21  Lonell Face, NP  prochlorperazine (COMPAZINE) 10 MG tablet Take 1 tablet (10 mg total) by mouth every 6 (six) hours as needed (Nausea or vomiting). 12/09/19 12/30/19  Earlie Server, MD   Recent Labs    07/12/20 1011  WBC 9.0  HGB 13.0  HCT 41.6  PLT 288  K 4.3  CL 103  CO2 27  BUN 16  CREATININE 0.92  GLUCOSE 140*  CALCIUM 8.8*   DG Lumbar Spine 2-3 Views  Result Date: 07/14/2020 CLINICAL DATA:  Postop.  LOWER back pain. EXAM: LUMBAR SPINE - 2-3 VIEW COMPARISON:  07/13/2020 FINDINGS: Patient has undergone posterior transpedicular fusion at L3-4 with interbody fusion device. No acute fracture. Degenerative changes are seen throughout the LOWER thoracic and lumbar spine. There is persistent contrast in the: Following CT exam on 07/12/2020. Numerous colonic diverticula are present. IMPRESSION: Status post L3-4  fusion. Electronically Signed   By: Nolon Nations M.D.   On: 07/14/2020 13:22   DG Lumbar Spine 2-3 Views  Result Date: 07/13/2020 CLINICAL DATA:  Lumbar spine surgery, L3-L4 TLIF EXAM: LUMBAR SPINE - 2-3 VIEW; DG C-ARM 1-60 MIN COMPARISON:  MRI lumbar spine 06/14/2020 FLUOROSCOPY TIME:  0 minutes 9 seconds Dose: 3.1193 mGy Images: 3 FINDINGS: 5 lumbar vertebra labeled on prior MR. Images demonstrate placement of BILATERAL pedicle screws and bars at L3-L4 with intervening disc prosthesis. Multilevel disc space narrowing and endplate spur formation lumbar spine. Osseous demineralization. IMPRESSION: Posterior fusion L3-L4. Electronically Signed   By: Lavonia Dana M.D.   On: 07/13/2020 15:27   DG Shoulder Left  Result Date: 07/14/2020 CLINICAL DATA:  LOWER back pain. LEFT shoulder pain and difficulty with abduction since surgery. EXAM: LEFT SHOULDER - 2+ VIEW COMPARISON:  Chest x-ray on 06/26/2020 FINDINGS: Moderate degenerative changes in the LEFT shoulder with subacromial spurring, subacromial narrowing, in clinic humeral osteophytes. There is no acute fracture or subluxation. LEFT lung apex is unremarkable. Note is made of power port overlying the LEFT chest. IMPRESSION: Degenerative changes of the LEFT shoulder.  No acute abnormality. Electronically Signed   By: Nolon Nations M.D.   On: 07/14/2020 13:20   DG C-Arm 1-60 Min  Result Date: 07/13/2020 CLINICAL DATA:  Lumbar spine surgery, L3-L4 TLIF EXAM: LUMBAR SPINE - 2-3 VIEW; DG C-ARM 1-60 MIN COMPARISON:  MRI lumbar spine 06/14/2020 FLUOROSCOPY TIME:  0 minutes 9 seconds Dose: 3.1193 mGy Images: 3 FINDINGS: 5 lumbar vertebra labeled on prior MR. Images demonstrate placement of BILATERAL pedicle screws and bars at L3-L4 with intervening disc prosthesis. Multilevel disc space narrowing and endplate spur formation lumbar spine. Osseous demineralization. IMPRESSION: Posterior fusion L3-L4. Electronically Signed   By: Lavonia Dana M.D.   On: 07/13/2020  15:27   DG C-Arm 1-60 Min  Result Date: 07/13/2020 CLINICAL DATA:  Lumbar spine surgery, L3-L4 TLIF EXAM: LUMBAR SPINE - 2-3 VIEW; DG C-ARM 1-60 MIN COMPARISON:  MRI lumbar spine 06/14/2020 FLUOROSCOPY TIME:  0 minutes 9 seconds Dose: 3.1193 mGy Images: 3 FINDINGS: 5 lumbar vertebra labeled on prior MR. Images demonstrate placement of BILATERAL pedicle screws and bars at L3-L4 with intervening disc prosthesis. Multilevel disc space narrowing and endplate spur formation lumbar spine. Osseous demineralization. IMPRESSION: Posterior fusion  L3-L4. Electronically Signed   By: Lavonia Dana M.D.   On: 07/13/2020 15:27   DG C-Arm 1-60 Min  Result Date: 07/13/2020 CLINICAL DATA:  Lumbar spine surgery, L3-L4 TLIF EXAM: LUMBAR SPINE - 2-3 VIEW; DG C-ARM 1-60 MIN COMPARISON:  MRI lumbar spine 06/14/2020 FLUOROSCOPY TIME:  0 minutes 9 seconds Dose: 3.1193 mGy Images: 3 FINDINGS: 5 lumbar vertebra labeled on prior MR. Images demonstrate placement of BILATERAL pedicle screws and bars at L3-L4 with intervening disc prosthesis. Multilevel disc space narrowing and endplate spur formation lumbar spine. Osseous demineralization. IMPRESSION: Posterior fusion L3-L4. Electronically Signed   By: Lavonia Dana M.D.   On: 07/13/2020 15:27     Positive ROS: All other systems have been reviewed and were otherwise negative with the exception of those mentioned in the HPI and as above.  Physical Exam: BP 136/66 (BP Location: Right Arm)   Pulse 62   Temp 97.8 F (36.6 C)   Resp 16   Ht 5\' 5"  (1.651 m)   Wt 63.5 kg   SpO2 99%   BMI 23.30 kg/m  General:  Alert, no acute distress Psychiatric:  Patient is competent for consent with normal mood and affect   Cardiovascular:  No pedal edema, regular rate and rhythm Respiratory:  No wheezing, non-labored breathing GI:  Abdomen is soft and non-tender Skin:  No lesions in the area of chief complaint, no erythema Neurologic:  Sensation intact distally, CN grossly  intact Lymphatic:  No axillary or cervical lymphadenopathy  Orthopedic Exam:  LUE: +ain/pin/u motor SILT r/u/m/ax +rad pulse RoM: Forward flexion to 90 degrees actively and 130 degrees passively, external rotation to 45 degrees, internal rotation to T8. Strength: 4/5 empty can, 5 -/5 external rotation, 4/5 bear hug, abnormal belly press  RUE: +ain/pin/u motor SILT r/u/m/ax +rad pulse RoM: Forward flexion to 100 degrees actively and 140 degrees passively, external rotation to 45 degrees, internal rotation to T8. Strength: 4/5 empty can, 5 -/5 external rotation, 4/5 bear hug, abnormal belly press   X-rays:  As above: Left shoulder radiographs demonstrate degenerative changes of the glenohumeral joint with osteophyte formation as well as decreased acromiohumeral interval with likely acetabular rotation of the acromion suggestive of chronic rotator cuff tear and rotator cuff arthropathy.  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  He had significant difficulty with active range of motion earlier this morning after surgery.  This was likely due to required position for surgery.  He has already noted some improvement since this morning.  He does not feel limited with ambulation or his rehab from his surgery due to the symptoms. 1.  I discussed with the patient both the clinical and radiographic findings suggesting chronic, bilateral rotator cuff tears.  He likely has an exacerbation due to keeping arms overhead during surgery yesterday.  Given his significant improvement, he will likely continue to make improvements with range of motion and pain.  2.  He can take medications as needed for pain control with any limitations per the neurosurgery team.  3.  If he is still having significant symptoms, I am certainly happy to evaluate him again as an outpatient.  I did  discuss with the patient that we could attempt a corticosteroid injection at that time, and a course of physical therapy may be helpful for him as well.  Patient is in agreement with this plan.  Please page with  any questions.  Above findings communicated to Dr. Izora Ribas.   Blake [name]   07/14/2020 3:34 PM

## 2020-07-14 NOTE — Evaluation (Signed)
Physical Therapy Evaluation Patient Details Name: Blake [name] MRN: [id number] DOB: 07-13-1945 Today's Date: 07/14/2020   History of Present Illness  Pt is a 75 yo male with lumbar fusion L3-L4. PMH includes HTN, GERD, DM, and tongue CA.  Clinical Impression  Pt received in Semi-Fowler's position and agreeable to therapy.  Precautions for lumbar spinal fusion were given to pt and was encouraged and educated on how to properly get up from bed.  Pt was able to demonstrate proper technique without an increase in pain.  Pt then proceeded to transfer from seated to standing with use of FWW.  Pt educated on brace and ambulating with the brace donned, only taking it off when lying in bed.  Pt able to recall precautions.  Pt able to ambulate around nursing station x2 with good technique.  Pt left in room with call bell in place and all needs met.  Pt will benefit from skilled PT intervention to increase independence and safety with basic mobility in preparation for discharge to the venue listed below.       Follow Up Recommendations Home health PT    Equipment Recommendations  Rolling walker with 5" wheels    Recommendations for Other Services       Precautions / Restrictions        Mobility  Bed Mobility Overal bed mobility: Modified Independent                  Transfers Overall transfer level: Modified independent Equipment used: Rolling walker (2 wheeled)             General transfer comment: Pt able to abide by spinal precautions and performing log roll in order to come to seated position at EOB.  Ambulation/Gait Ambulation/Gait assistance: Min guard Gait Distance (Feet): 240 Feet Assistive device: Rolling walker (2 wheeled) Gait Pattern/deviations: WFL(Within Functional Limits) Gait velocity: decreased   General Gait Details: Pt does have forward trunk when ambulating, however keeps his head up and is steady when ambulating.  Stairs             Wheelchair Mobility    Modified Rankin (Stroke Patients Only)       Balance Overall balance assessment: History of Falls;Mild deficits observed, not formally tested                                           Pertinent Vitals/Pain Pain Assessment: No/denies pain    Home Living Family/patient expects to be discharged to:: Private residence Living Arrangements: Spouse/significant other Available Help at Discharge: Family (Son is flying in on Tuesday to help out.) Type of Home: House Home Access: Level entry Entrance Stairs-Rails: None   Home Layout: Two level;Able to live on main level with bedroom/bathroom Home Equipment: Walker - 4 wheels;Cane - single point;Grab bars - tub/shower;Hand held shower head      Prior Function Level of Independence: Independent         Comments: Ind amb community distance with use of AD (cane and walker), Ind with ADLs.     Hand Dominance   Dominant Hand: Right    Extremity/Trunk Assessment   Upper Extremity Assessment Upper Extremity Assessment: Defer to OT evaluation    Lower Extremity Assessment Lower Extremity Assessment: Generalized weakness       Communication   Communication: No difficulties  Cognition Arousal/Alertness: Awake/alert Behavior During Therapy: [facility] for  tasks assessed/performed Overall Cognitive Status: Within Functional Limits for tasks assessed                                        General Comments General comments (skin integrity, edema, etc.): Pt has decreased ROM of B UE's noted.    Exercises Other Exercises Other Exercises: Pt educated on role of PT and services provided during hospital stay.  Pt also educated on physiological benefits of performing exercises between bouts of therapy.   Assessment/Plan    PT Assessment Patient needs continued PT services  PT Problem List Decreased strength;Decreased range of motion;Decreased activity tolerance;Decreased  mobility;Decreased knowledge of use of DME;Decreased safety awareness;Pain       PT Treatment Interventions DME instruction;Gait training;Functional mobility training;Therapeutic activities;Therapeutic exercise;Balance training    PT Goals (Current goals can be found in the Care Plan section)  Acute Rehab PT Goals Patient Stated Goal: To go home. PT Goal Formulation: With patient Time For Goal Achievement: 07/28/20 Potential to Achieve Goals: Good    Frequency 7X/week   Barriers to discharge        Co-evaluation               AM-PAC PT "6 Clicks" Mobility  Outcome Measure Help needed turning from your back to your side while in a flat bed without using bedrails?: A Little Help needed moving from lying on your back to sitting on the side of a flat bed without using bedrails?: A Little Help needed moving to and from a bed to a chair (including a wheelchair)?: A Little Help needed standing up from a chair using your arms (e.g., wheelchair or bedside chair)?: A Little Help needed to walk in hospital room?: A Little Help needed climbing 3-5 steps with a railing? : A Little 6 Click Score: 18    End of Session Equipment Utilized During Treatment: Gait belt;Back brace Activity Tolerance: Patient tolerated treatment well Patient left: in chair;with call bell/phone within reach;with chair alarm set Nurse Communication: Mobility status PT Visit Diagnosis: Unsteadiness on feet (R26.81);Other abnormalities of gait and mobility (R26.89);Repeated falls (R29.6);Muscle weakness (generalized) (M62.81);History of falling (Z91.81);Difficulty in walking, not elsewhere classified (R26.2)    Time: 8295-6213 PT Time Calculation (min) (ACUTE ONLY): 51 min   Charges:   PT Evaluation $PT Eval Low Complexity: 1 Low PT Treatments $Gait Training: 23-37 mins $Self Care/Home Management: 8-22        Gwenlyn Saran, PT, DPT 07/14/20, 1:24 PM   Christie Nottingham 07/14/2020, 1:22 PM

## 2020-07-14 NOTE — Progress Notes (Signed)
    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  Surgery data 07/13/20 L3-4 TLIF.  POD1: Doing very well. Back pain is low.  Legs feel better.  He has walked. Since surgery, he has had L shoulder pain and difficulty with abduction.   Physical Exam: Vitals:   07/14/20 0418 07/14/20 0823  BP: (!) 150/73 136/66  Pulse: 61 62  Resp:  16  Temp: 97.6 F (36.4 C) 97.8 F (36.6 C)  SpO2: 100% 99%    AA Ox3 CNI  Strength:5/5 throughout BLE  5/5 BUE except limited shoulder abduction on L.  +TTP L shoulder.    Data:  Recent Labs  Lab 07/12/20 1011  NA 139  K 4.3  CL 103  CO2 27  BUN 16  CREATININE 0.92  GLUCOSE 140*  CALCIUM 8.8*   Recent Labs  Lab 07/12/20 1011  AST 17  ALT 15  ALKPHOS 56     Recent Labs  Lab 07/12/20 1011  WBC 9.0  HGB 13.0  HCT 41.6  PLT 288   No results for input(s): APTT, INR in the last 168 hours.       Other tests/results: obtain xrays today  Drain 270  Assessment/Plan:  HOBSON LAX is doing well from TLIF. I am concerned that he suffered a left shoulder injury with positioning during surgery.  - mobilize - pain control - DVT prophylaxis - PTOT - Check shoulder and Lumbar xrays - consult orthopedics for input   Meade Maw MD, [facility] Department of Neurosurgery

## 2020-07-14 NOTE — Evaluation (Signed)
Occupational Therapy Evaluation Patient Details Name: Blake [name] MRN: [id number] DOB: February 21, 1946 Today's Date: 07/14/2020    History of Present Illness Pt is a 75 yo male with lumbar fusion L3-L4. PMH includes HTN, GERD, DM, and tongue CA.   Clinical Impression   Pt seen for OT evaluation this date in setting of acute hospitalization d/p L3-L4 fusion. Pt reports being completely INDEP at baseline including driving. Lives at home with spouse. Presents this date with minimal limitations with ADLs 2/2 back precautions. Pt educated on back precautions for bending, lifting and twisting and how this will apply to LB ADLs. Pt with good hip ROM bilaterally to use compensatory method for LB dressing and bathing-cross leg technique. Pt with no other OT needs at this time. Will complete order.     Follow Up Recommendations  No OT follow up    Equipment Recommendations  None recommended by OT    Recommendations for Other Services       Precautions / Restrictions Precautions Precautions: Back Precaution Comments: Pt educated on log roll in order to get out of bed. Required Braces or Orthoses: Spinal Brace Spinal Brace: Lumbar corset;Applied in sitting position Restrictions Weight Bearing Restrictions: Yes Other Position/Activity Restrictions: no lifting over 10#      Mobility Bed Mobility Overal bed mobility: Modified Independent                  Transfers Overall transfer level: Modified independent Equipment used: Rolling walker (2 wheeled)             General transfer comment: Pt able to abide by spinal precautions and performing log roll in order to come to seated position at EOB.    Balance Overall balance assessment: History of Falls;Mild deficits observed, not formally tested                                         ADL either performed or assessed with clinical judgement   ADL Overall ADL's : Modified independent;At baseline                                        General ADL Comments: cross-leg technique to perform LB ADLs d/t  back precautions. Education completed and pt with good recall/awareness. Hip ROm is appropriate to use compensatory method     Vision Baseline Vision/History: Wears glasses Wears Glasses: At all times Patient Visual Report: No change from baseline       Perception     Praxis      Pertinent Vitals/Pain Pain Assessment: No/denies pain     Hand Dominance Right   Extremity/Trunk Assessment Upper Extremity Assessment Upper Extremity Assessment: Overall WFL for tasks assessed   Lower Extremity Assessment Lower Extremity Assessment: Overall WFL for tasks assessed;Generalized weakness;Defer to PT evaluation (good LB ROM to compensate for LB dressing/ADLs d/t precautions)       Communication Communication Communication: No difficulties   Cognition Arousal/Alertness: Awake/alert Behavior During Therapy: WFL for tasks assessed/performed Overall Cognitive Status: Within Functional Limits for tasks assessed                                     General Comments       Exercises  Other Exercises Other Exercises: OT facilitates ed on back precautions as they apply to LB ADLs. Pt and spouse with good understanding. All education completed.   Shoulder Instructions      Home Living Family/patient expects to be discharged to:: Private residence Living Arrangements: Spouse/significant other Available Help at Discharge: Family;Available PRN/intermittently (son flying in Tuesday) Type of Home: House Home Access: Level entry   Entrance Stairs-Rails: None Home Layout: Two level;Able to live on main level with bedroom/bathroom     Bathroom Shower/Tub: Occupational psychologist: Handicapped height Bathroom Accessibility: Yes   Home Equipment: Environmental consultant - 4 wheels;Cane - single point;Grab bars - tub/shower;Hand held shower head          Prior  Functioning/Environment Level of Independence: Independent        Comments: Ind amb community distance with use of AD (cane and walker), Ind with ADLs.        OT Problem List: Decreased range of motion (decreased trunk ROM d/t precautions)      OT Treatment/Interventions: Self-care/ADL training    OT Goals(Current goals can be found in the care plan section) Acute Rehab OT Goals Patient Stated Goal: To go home. OT Goal Formulation: All assessment and education complete, DC therapy  OT Frequency:     Barriers to D/C:            Co-evaluation              AM-PAC OT "6 Clicks" Daily Activity     Outcome Measure Help from another person eating meals?: None Help from another person taking care of personal grooming?: None Help from another person toileting, which includes using toliet, bedpan, or urinal?: None Help from another person bathing (including washing, rinsing, drying)?: None Help from another person to put on and taking off regular upper body clothing?: None Help from another person to put on and taking off regular lower body clothing?: None 6 Click Score: 24   End of Session    Activity Tolerance: Patient tolerated treatment well Patient left: Other (comment) (seated EOB to eat dinner with spouse present in the room throughout)  OT Visit Diagnosis: Other abnormalities of gait and mobility (R26.89)                Time: 2878-6767 OT Time Calculation (min): 13 min Charges:  OT General Charges $OT Visit: 1 Visit OT Evaluation $OT Eval Low Complexity: [place], MS, OTR/L ascom [phone] 07/14/20, 5:22 PM

## 2020-07-15 MED ORDER — ADULT MULTIVITAMIN W/MINERALS CH
1.0000 | ORAL_TABLET | Freq: Every day | ORAL | Status: DC
  Administered 2020-07-15 – 2020-07-16 (×2): 1 via ORAL
  Filled 2020-07-15 (×2): qty 1

## 2020-07-15 MED ORDER — GLUCERNA SHAKE PO LIQD
237.0000 mL | Freq: Three times a day (TID) | ORAL | Status: DC
  Administered 2020-07-15: 237 mL via ORAL

## 2020-07-15 NOTE — Progress Notes (Signed)
    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  Surgery data 07/13/20 L3-4 TLIF.  POD2: Having additional back pain today.  Legs still feeling very good.  Had some limitation walking with PT.  Shoulder is feeling much better POD1: Doing very well. Back pain is low.  Legs feel better.  He has walked. Since surgery, he has had L shoulder pain and difficulty with abduction.   Physical Exam: Vitals:   07/15/20 1004 07/15/20 1144  BP: (!) 112/49 132/67  Pulse: 75 71  Resp: 16 16  Temp: 98.4 F (36.9 C) 97.7 F (36.5 C)  SpO2: 99% 99%    AA Ox3 CNI  Strength:5/5 throughout BLE  5/5 BUE except limited shoulder abduction on L (though much improved).  Data:  Recent Labs  Lab 07/12/20 1011  NA 139  K 4.3  CL 103  CO2 27  BUN 16  CREATININE 0.92  GLUCOSE 140*  CALCIUM 8.8*   Recent Labs  Lab 07/12/20 1011  AST 17  ALT 15  ALKPHOS 56     Recent Labs  Lab 07/12/20 1011  WBC 9.0  HGB 13.0  HCT 41.6  PLT 288   No results for input(s): APTT, INR in the last 168 hours.       Other tests/results: Imaging reviewed - no complications Drain 749  Assessment/Plan:  Blake [name] is doing well from TLIF. His shoulder is improved. He is not ready for discharge.  - mobilize - pain control - DVT prophylaxis - PTOT - Appreciate ortho consultation - patient is feeling better.     Blake [name], [facility] Department of Neurosurgery

## 2020-07-15 NOTE — Progress Notes (Signed)
Initial Nutrition Assessment  DOCUMENTATION CODES:   Not applicable  INTERVENTION:   -Glucerna Shake po TID, each supplement provides 220 kcal and 10 grams of protein -MVI with minerals daily  NUTRITION DIAGNOSIS:   Increased nutrient needs related to post-op healing as evidenced by estimated needs.  GOAL:   Patient will meet greater than or equal to 90% of their needs  MONITOR:   PO intake,Supplement acceptance,Labs,Weight trends,Skin,I & O's  REASON FOR ASSESSMENT:   Malnutrition Screening Tool    ASSESSMENT:   Blake [name] presented with spondylolisthesis of the lumbar region as well as right leg weakness.  He failed conservative management.  Pt admitted with spondyloschisis.   4/15- s/p L3-4 TLIF  Reviewed I/O's: -725 ml x 24 hours and -160 ml since admission  UOP: 1.4 L x 24 hours  Drain output: 130 ml x 24 hours  Pt unavailable at time of visit. Attempted to speak with pt via call to hospital room phone, however, unable to reach.   Pt with great appetite. Noted meal completion 100%.  Medications reviewed and include lasix, remeron, and senokot.  Reviewed wt hx; pt has experienced a 11.9% wt loss over the past 6 months, which is significant for time frame.   Lab Results  Component Value Date   HGBA1C 6.9 (H) 01/25/2013   PTA DM medications are 500 mg metformin BID.   Labs reviewed: CBGS: 135 (inpatient orders for glycemic control are 500 mg metformin BID).   Diet Order:   Diet Order            Diet heart healthy/carb modified Room service appropriate? Yes; Fluid consistency: Thin  Diet effective now                 EDUCATION NEEDS:   No education needs have been identified at this time  Skin:  Skin Assessment: Skin Integrity Issues: Skin Integrity Issues:: Incisions Incisions: closed back  Last BM:  07/12/20  Height:   Ht Readings from Last 1 Encounters:  07/13/20 5\' 5"  (1.651 m)    Weight:   Wt Readings from Last 1 Encounters:   07/13/20 63.5 kg    Ideal Body Weight:  61.8 kg  BMI:  Body mass index is 23.3 kg/m.  Estimated Nutritional Needs:   Kcal:  2000-2200  Protein:  110-125 grams  Fluid:  > 2 L    Loistine Chance, RD, LDN, [place] Registered Dietitian II Certified Diabetes Care and Education Specialist Please refer to [facility] for RD and/or RD on-call/weekend/after hours pager

## 2020-07-15 NOTE — Progress Notes (Signed)
Physical Therapy Treatment Patient Details Name: Blake [name] MRN: [id number] DOB: Jan 09, 1946 Today's Date: 07/15/2020    History of Present Illness Pt is a 75 yo male with lumbar fusion L3-L4. PMH includes HTN, GERD, DM, and tongue CA.    PT Comments    Upon arrival to room. Pt found sitting EOB but had reclined backwards on bed with feet on floor and head forward flexed with head resting on raised bed rail on other side.  Pt stated he was in increased pain today from "neck to toes" and generally had a bad night last night.  Educated on position and encouraged not to lay down on bed as he was.  Assisted to upright.  Brace donned.  Pt hesitant to stand stating he was unsure if he could stand today.  Stood with min a x 1 and encouraged to march in place.  Hesitant and slow movements stating he was nervous he would fall.  No buckling noted with marching and agrees to sit in recliner.  Transferred with min a x 1.  Did not feel he could safely walk further at this time.  Pt stated son is flying in tomorrow to assist.  Wife has stage 4 lung CA mets.  Will try to see pt again today for further mobility as time allows but discharge may be more appropriate tomorrow when son is here to help..   Follow Up Recommendations  Home health PT     Equipment Recommendations  Rolling walker with 5" wheels    Recommendations for Other Services       Precautions / Restrictions Precautions Precautions: Back Precaution Comments: Pt educated on log roll in order to get out of bed. Required Braces or Orthoses: Spinal Brace Spinal Brace: Lumbar corset;Applied in sitting position Restrictions Weight Bearing Restrictions: Yes Other Position/Activity Restrictions: no lifting over 10#    Mobility  Bed Mobility Overal bed mobility: Modified Independent                  Transfers Overall transfer level: Needs assistance Equipment used: Rolling walker (2 wheeled) Transfers: Sit to/from Stand Sit  to Stand: Min guard;Min assist         General transfer comment: hesitant today  Ambulation/Gait Ambulation/Gait assistance: Min guard Gait Distance (Feet): 3 Feet Assistive device: Rolling walker (2 wheeled) Gait Pattern/deviations: Step-to pattern Gait velocity: decreased   General Gait Details: generally hesitant today feeling as he will fall   Stairs             Wheelchair Mobility    Modified Rankin (Stroke Patients Only)       Balance Overall balance assessment: Needs assistance Sitting-balance support: Feet supported Sitting balance-Leahy Scale: Good     Standing balance support: Bilateral upper extremity supported Standing balance-Leahy Scale: Fair Standing balance comment: reliant on walker for assist.                            Cognition Arousal/Alertness: Awake/alert Behavior During Therapy: WFL for tasks assessed/performed Overall Cognitive Status: Within Functional Limits for tasks assessed                                        Exercises      General Comments        Pertinent Vitals/Pain Pain Assessment: Faces Faces Pain Scale: Hurts even more Pain  Location: "neck to toes" Pain Descriptors / Indicators: Aching;Radiating Pain Intervention(s): Limited activity within patient's tolerance;Monitored during session;Repositioned    Home Living                      Prior Function            PT Goals (current goals can now be found in the care plan section) Progress towards PT goals: Not progressing toward goals - comment    Frequency    7X/week      PT Plan      Co-evaluation              AM-PAC PT "6 Clicks" Mobility   Outcome Measure  Help needed turning from your back to your side while in a flat bed without using bedrails?: A Little Help needed moving from lying on your back to sitting on the side of a flat bed without using bedrails?: A Little Help needed moving to and from a  bed to a chair (including a wheelchair)?: A Little Help needed standing up from a chair using your arms (e.g., wheelchair or bedside chair)?: A Little Help needed to walk in hospital room?: A Little Help needed climbing 3-5 steps with a railing? : A Lot 6 Click Score: 17    End of Session Equipment Utilized During Treatment: Gait belt;Back brace Activity Tolerance: Patient tolerated treatment well Patient left: in chair;with call bell/phone within reach;with chair alarm set Nurse Communication: Mobility status PT Visit Diagnosis: Unsteadiness on feet (R26.81);Other abnormalities of gait and mobility (R26.89);Repeated falls (R29.6);Muscle weakness (generalized) (M62.81);History of falling (Z91.81);Difficulty in walking, not elsewhere classified (R26.2)     Time: 9767-3419 PT Time Calculation (min) (ACUTE ONLY): 9 min  Charges:  $Therapeutic Activity: 8-22 mins                    Chesley Noon, PTA 07/15/20, 9:38 AM

## 2020-07-15 NOTE — Plan of Care (Signed)
Patient had an uneventful shift. No changes in neurological and neurovascular assessments. Pain controlled with PRN medications. Vital signs within normal range.Denies any needs at this time. All Safety measures maintained. Care continues.  Problem: Education: Goal: Knowledge of General Education information will improve Description: Including pain rating scale, medication(s)/side effects and non-pharmacologic comfort measures Outcome: Progressing   Problem: Health Behavior/Discharge Planning: Goal: Ability to manage health-related needs will improve Outcome: Progressing   Problem: Clinical Measurements: Goal: Ability to maintain clinical measurements within normal limits will improve Outcome: Progressing Goal: Will remain free from infection Outcome: Progressing Goal: Diagnostic test results will improve Outcome: Progressing Goal: Respiratory complications will improve Outcome: Progressing Goal: Cardiovascular complication will be avoided Outcome: Progressing   Problem: Activity: Goal: Risk for activity intolerance will decrease Outcome: Progressing   Problem: Nutrition: Goal: Adequate nutrition will be maintained Outcome: Progressing   Problem: Coping: Goal: Level of anxiety will decrease Outcome: Progressing   Problem: Elimination: Goal: Will not experience complications related to bowel motility Outcome: Progressing Goal: Will not experience complications related to urinary retention Outcome: Progressing   Problem: Pain Managment: Goal: General experience of comfort will improve Outcome: Progressing   Problem: Safety: Goal: Ability to remain free from injury will improve Outcome: Progressing   Problem: Skin Integrity: Goal: Risk for impaired skin integrity will decrease Outcome: Progressing   Problem: Education: Goal: Ability to verbalize activity precautions or restrictions will improve Outcome: Progressing Goal: Knowledge of the prescribed therapeutic  regimen will improve Outcome: Progressing Goal: Understanding of discharge needs will improve Outcome: Progressing   Problem: Activity: Goal: Ability to avoid complications of mobility impairment will improve Outcome: Progressing Goal: Ability to tolerate increased activity will improve Outcome: Progressing Goal: Will remain free from falls Outcome: Progressing   Problem: Bowel/Gastric: Goal: Gastrointestinal status for postoperative course will improve Outcome: Progressing   Problem: Clinical Measurements: Goal: Ability to maintain clinical measurements within normal limits will improve Outcome: Progressing Goal: Postoperative complications will be avoided or minimized Outcome: Progressing Goal: Diagnostic test results will improve Outcome: Progressing   Problem: Pain Management: Goal: Pain level will decrease Outcome: Progressing   Problem: Skin Integrity: Goal: Will show signs of wound healing Outcome: Progressing   Problem: Health Behavior/Discharge Planning: Goal: Identification of resources available to assist in meeting health care needs will improve Outcome: Progressing   Problem: Bladder/Genitourinary: Goal: Urinary functional status for postoperative course will improve Outcome: Progressing

## 2020-07-15 NOTE — TOC Initial Note (Signed)
Transition of Care [facility]) - Initial/Assessment Note    Patient Details  Name: Blake [name] MRN: [id number] Date of Birth: May 20, 1945  [facility] CM/SW Contact:    Harriet Masson, RN Phone [phone] 07/15/2020, 1:19 PM  Clinical Narrative:                 Spoke with pt today concerning [facility] recommendations for HHPT. Pt states Dr. Cari Caraway has already arranged for [facility] to be involved for PT/OT in the home. RN called Glennis Brink with [facility] and left message. No other needs as pt has a good support system with his live in spouse. Pt lives in a house and able to afford all his medications receives from [place].   TOC available for any additional needs.  Expected Discharge Plan: [place] ([facility] has [facility] arranged already) Barriers to Discharge: No Barriers Identified   Patient Goals and CMS Choice        Expected Discharge Plan and Services Expected Discharge Plan: [place] (Pt has [facility] arranged already)   Discharge Planning Services: CM Consult                             Representative spoke with at DME Agency: [place] Agency: [facility] [place] (Pt states already has arrangements for [facility] for PT/OT) Date [facility] Agency Contacted: 07/15/20 Time Otho: 29 Representative spoke with at [place]: Glennis Brink  Prior Living Arrangements/Services   Lives with:: Spouse   Do you feel safe going back to the place where you live?: Yes      Need for Family Participation in Patient Care: Yes (Comment) Care giver support system in place?: Yes (comment)   Criminal Activity/Legal Involvement Pertinent to Current Situation/Hospitalization: No - Comment as needed  Activities of Daily Living Home Assistive Devices/Equipment: Walker (specify type),Cane (specify quad or straight) ADL Screening (condition at time of  admission) Patient's cognitive ability adequate to safely complete daily activities?: Yes Is the patient deaf or have difficulty hearing?: No Does the patient have difficulty seeing, even when wearing glasses/contacts?: No Does the patient have difficulty concentrating, remembering, or making decisions?: No Patient able to express need for assistance with ADLs?: Yes Does the patient have difficulty dressing or bathing?: No Independently performs ADLs?: Yes (appropriate for developmental age) Does the patient have difficulty walking or climbing stairs?: No Weakness of Legs: Both Weakness of Arms/Hands: Both  Permission Sought/Granted   Permission granted to share information with : Yes, Verbal Permission Granted              Emotional Assessment Appearance:: Appears stated age Attitude/Demeanor/Rapport: Engaged Affect (typically observed): Accepting Orientation: : Oriented to Self,Oriented to Place,Oriented to  Time,Oriented to Situation   Psych Involvement: No (comment)  Admission diagnosis:  Spondylolisthesis [M43.10] Patient Active Problem List   Diagnosis Date Noted  . Spondylolisthesis 07/13/2020  . Chest pain 06/26/2020  . Hypertensive urgency 06/26/2020  . Shortness of breath 06/26/2020  . Pneumonia 01/17/2020  . Non-intractable vomiting 12/26/2019  . 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 History of head and neck cancer 12/09/2019  . Chronic pain syndrome 12/08/2019  . History of lumbar laminectomy 12/08/2019  . Supraventricular tachycardia ([place]) 12/08/2019  . Acute postoperative pain 09/13/2019  . 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 Goals of care, counseling/discussion 05/04/2019  . Osteoarthritis  04/26/2019  . Tongue cancer ([place]) 04/26/2019  . Bilateral hand numbness 12/16/2018  . Bilateral hand pain 12/16/2018  . Lumbar radiculopathy 06/02/2017  . Lumbar degenerative disc disease 06/02/2017  . 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 Primary osteoarthritis of right shoulder 07/01/2016  . Bilateral carotid artery stenosis 06/05/2016  . Carotid stenosis 04/22/2016  . Allergic rhinitis 08/24/2015  . Diabetes mellitus ([place]) 08/24/2015  . 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 HLD (hyperlipidemia) 08/24/2015  . 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 Stenosing tenosynovitis of finger 07/11/2014  . Coronary artery disease 06/26/2014  . Family history of cancer of digestive system 10/20/2006  . 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 Acid reflux 04/01/2003  . Essential (primary) hypertension 04/01/2003   PCP:  Idelle Crouch, MD Pharmacy:   [facility], [place] [address] Phone: [phone] Fax: [phone]  [place] [address] Phone: [phone] Fax: [place], [place] [address] [place] [address] Phone: [phone] Fax: [place] [address] Phone: [phone] Fax: [phone]     Social Determinants of Health (SDOH) Interventions    Readmission Risk Interventions No flowsheet data found.

## 2020-07-15 NOTE — Progress Notes (Signed)
   07/15/20 1540  What Happened  Was fall witnessed? No  Was patient injured? No  Patient found in bathroom  Found by Staff-comment Bobetta Lime NT)  Stated prior activity bathroom-unassisted  Follow Up  MD notified Dwyane Dee  Time MD notified [phone]  Family notified No - patient refusal  Additional tests No  Simple treatment Other (comment) (none)  Progress note created (see row info) Yes  Adult Fall Risk Assessment  Risk Factor Category (scoring not indicated) Fall has occurred during this admission (document High fall risk)  Patient Fall Risk Level High fall risk  Adult Fall Risk Interventions  Required Bundle Interventions *See Row Information* High fall risk - low, moderate, and high requirements implemented  Additional Interventions Use of appropriate toileting equipment (bedpan, BSC, etc.);PT/OT need assessed if change in mobility from baseline  Screening for Fall Injury Risk (To be completed on HIGH fall risk patients) - Assessing Need for Floor Mats  Risk For Fall Injury- Criteria for Floor Mats Previous fall this admission  Will Implement Floor Mats Yes  Pain Assessment  Pain Scale 0-10  Pain Score 0  PCA/Epidural/Spinal Assessment  Respiratory Pattern Regular;Unlabored  Neurological  Neuro (WDL) WDL  [place] Coma Scale  Eye Opening 4  Best Verbal Response (NON-intubated) 5  Best Motor Response 6  Musculoskeletal  Musculoskeletal (WDL) WDL  Assistive Device Front wheel walker  Integumentary  Integumentary (WDL) X  Skin Color Appropriate for ethnicity  Skin Condition Dry  Skin Integrity Surgical Incision (see LDA)  Skin Turgor Non-tenting

## 2020-07-16 ENCOUNTER — Encounter: Payer: Self-pay | Admitting: Neurosurgery

## 2020-07-16 ENCOUNTER — Other Ambulatory Visit: Payer: Self-pay

## 2020-07-16 ENCOUNTER — Inpatient Hospital Stay: Payer: Medicare Other | Admitting: Oncology

## 2020-07-16 MED ORDER — SENNOSIDES-DOCUSATE SODIUM 8.6-50 MG PO TABS
1.0000 | ORAL_TABLET | Freq: Two times a day (BID) | ORAL | 0 refills | Status: DC
  Filled 2020-07-16: qty 60, 30d supply, fill #0

## 2020-07-16 MED ORDER — OXYCODONE HCL 5 MG PO TABS
5.0000 mg | ORAL_TABLET | ORAL | 0 refills | Status: DC | PRN
  Filled 2020-07-16: qty 60, 7d supply, fill #0

## 2020-07-16 MED ORDER — ACETAMINOPHEN 325 MG PO TABS
650.0000 mg | ORAL_TABLET | ORAL | 0 refills | Status: DC | PRN
  Filled 2020-07-16: qty 60, 5d supply, fill #0

## 2020-07-16 MED ORDER — METHOCARBAMOL 500 MG PO TABS
750.0000 mg | ORAL_TABLET | Freq: Four times a day (QID) | ORAL | 1 refills | Status: DC | PRN
  Filled 2020-07-16 – 2020-07-26 (×2): qty 90, 15d supply, fill #0
  Filled 2020-12-10: qty 90, 15d supply, fill #1

## 2020-07-16 NOTE — Discharge Instructions (Signed)
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 Increase protein intake to promote wound healing.  Recommended activity: no heavy lifting, pushing, pulling. You should walk multiple times per day  Special Instructions  No straining, no heavy lifting > 10lbs x 6 weeks.  Keep incision area clean and dry. May shower. No baths or pools for 6 weeks.  Please remove dressing tomorrow, no need to apply a bandage afterwards  You have no sutures to remove, the skin is closed with sutures beneath the skin  Please take pain medications as directed. Take a stool softener if on pain medications   Please Report any of the following: Nausea or Vomiting, Temperature is greater than 101.46F (38.1C) degrees, Dizziness, Abdominal Pain, Difficulty Breathing or Shortness of Breath, Inability to Eat, drink Fluids, or Take medications, Bleeding, swelling, or drainage from surgical incision sites, New numbness or weakness, and Bowel or bladder dysfunction to the neurosurgeon on call at [phone]  Additional Follow up appointments Please follow up with Dr Cari Caraway in [facility] as scheduled in 2-3 weeks   Please see below for scheduled appointments:  Future Appointments  Date Time Provider [place]  07/16/2020  2:15 PM Earlie Server, MD [facility] None  08/31/2020  2:15 PM [facility] None  10/18/2020 10:00 AM Ralene Bathe, MD [facility] None  10/23/2020  2:00 PM [facility] 1 [facility] None  10/23/2020  3:15 PM Dew, Erskine Squibb, MD [facility] None  10/26/2020  2:00 PM [facility] None  12/21/2020  1:30 PM [facility] None  02/15/2021  1:15 PM [facility] None

## 2020-07-16 NOTE — Care Management Important Message (Signed)
Important Message  Patient Details  Name: Blake [name] MRN: [id number] Date of Birth: 1945/11/25   Medicare Important Message Given:  N/A - LOS <3 / Initial given by admissions     [name] 07/16/2020, 10:12 AM

## 2020-07-16 NOTE — Progress Notes (Signed)
Drain removed by MD. No prevena connected at this time

## 2020-07-16 NOTE — Plan of Care (Signed)

## 2020-07-16 NOTE — Progress Notes (Signed)
Pt discharged home with wife. All instructions provided with questions answered.   BP (!) 108/55 (BP Location: Right Arm)   Pulse 80   Temp 98.1 F (36.7 C) (Oral)   Resp 16   Ht 5\' 5"  (1.651 m)   Wt 63.5 kg   SpO2 100%   BMI 23.30 kg/m

## 2020-07-16 NOTE — Progress Notes (Signed)
Physical Therapy Treatment Patient Details Name: Blake [name] MRN: [id number] DOB: 03/04/1946 Today's Date: 07/16/2020    History of Present Illness Pt is a 75 yo male with lumbar fusion L3-L4. PMH includes HTN, GERD, DM, and tongue CA.    PT Comments    Patient agreeable to PT session and reports he is planning to discharge later today. Patient reports he had a fall in the bathroom yesterday due to brace falling down to the floor while standing. Educated patient on donning/doffing brace and how to adjust for snug, comfortable fit. Patient has difficulty with logroll technique when getting up from bed, however was able to demonstrate understanding with return to bed. Patient was able to stand without physical assistance and good safety awareness. Patient ambulated in hallway with brace using rolling walker with cues for upright posture. No loss of balance with ambulation, however highly encouraged patient to use rolling walker with ambulation for safety and fall prevention at home. Patient was able to recall general spine precautions and therapist re-educated patient on these and importance to maintain with functional activity. Patient's son is flying in to assist at discharge at home. Recommend HHPT and supervision for mobility at discharge.    Follow Up Recommendations  Home health PT;Supervision for mobility/OOB     Equipment Recommendations  Rolling walker with 5" wheels    Recommendations for Other Services       Precautions / Restrictions Precautions Precautions: Back Required Braces or Orthoses: Spinal Brace Spinal Brace: Lumbar corset;Applied in sitting position Restrictions Weight Bearing Restrictions: Yes Other Position/Activity Restrictions: no lifting over 10#    Mobility  Bed Mobility Overal bed mobility: Modified Independent             General bed mobility comments: patient was unable to demonstrate logroll technique while sitting up despite verbal cues.  logroll technique was used when returning to bed. educated patient on importance of uising this technique to maintain general spine precautions    Transfers Overall transfer level: Needs assistance Equipment used: Rolling walker (2 wheeled) Transfers: Sit to/from Stand Sit to Stand: Supervision         General transfer comment: brace donned in sitting position prior to mobilizing. patient is demonstrating good safety awareness with functional transfers  Ambulation/Gait Ambulation/Gait assistance: Min guard Gait Distance (Feet): 75 Feet Assistive device: Rolling walker (2 wheeled) Gait Pattern/deviations: Step-through pattern;Trunk flexed;Decreased stride length Gait velocity: decreased   General Gait Details: verbal cues for upright posture and technique to use rolling walker correctly. no improvement in trunk flexion with cues as patient reports it feels more painful to stand upright   Stairs             Wheelchair Mobility    Modified Rankin (Stroke Patients Only)       Balance Overall balance assessment: Needs assistance Sitting-balance support: Feet supported Sitting balance-Leahy Scale: Good     Standing balance support: Bilateral upper extremity supported Standing balance-Leahy Scale: Fair Standing balance comment: patient relying on rolling walker for UE support in standing position. no loss of balance                            Cognition Arousal/Alertness: Awake/alert Behavior During Therapy: WFL for tasks assessed/performed Overall Cognitive Status: Within Functional Limits for tasks assessed  Exercises      General Comments        Pertinent Vitals/Pain Pain Assessment: Faces Pain Score: 4  Pain Location: lower back, bilateral shoulders, right thigh Pain Descriptors / Indicators: Discomfort Pain Intervention(s): Limited activity within patient's tolerance    Home Living                       Prior Function            PT Goals (current goals can now be found in the care plan section) Acute Rehab PT Goals Patient Stated Goal: to go home today PT Goal Formulation: With patient Time For Goal Achievement: 07/28/20 Potential to Achieve Goals: Good Progress towards PT goals: Progressing toward goals    Frequency    7X/week      PT Plan Current plan remains appropriate    Co-evaluation              AM-PAC PT "6 Clicks" Mobility   Outcome Measure  Help needed turning from your back to your side while in a flat bed without using bedrails?: A Little Help needed moving from lying on your back to sitting on the side of a flat bed without using bedrails?: A Little Help needed moving to and from a bed to a chair (including a wheelchair)?: A Little Help needed standing up from a chair using your arms (e.g., wheelchair or bedside chair)?: A Little Help needed to walk in hospital room?: A Little Help needed climbing 3-5 steps with a railing? : A Little 6 Click Score: 18    End of Session Equipment Utilized During Treatment: Back brace Activity Tolerance: Patient tolerated treatment well;No increased pain Patient left: in bed;with call bell/phone within reach;with bed alarm set Nurse Communication: Mobility status PT Visit Diagnosis: Unsteadiness on feet (R26.81);Other abnormalities of gait and mobility (R26.89);Repeated falls (R29.6);Muscle weakness (generalized) (M62.81);History of falling (Z91.81);Difficulty in walking, not elsewhere classified (R26.2)     Time: 7001-7494 PT Time Calculation (min) (ACUTE ONLY): 26 min  Charges:  $Gait Training: 8-22 mins $Therapeutic Activity: 8-22 mins                     Blake [name], PT, MPT    Blake [name] 07/16/2020, 9:32 AM

## 2020-07-16 NOTE — Plan of Care (Signed)
Patient had an uneventful shift. No changes in neurological and neurovascular assessments. Pain controlled with PRN medications. Vital signs within normal range.Denies any needs at this time. All Safety measures maintained. Care continues.  Problem: Education: Goal: Knowledge of General Education information will improve Description: Including pain rating scale, medication(s)/side effects and non-pharmacologic comfort measures Outcome: Progressing   Problem: Health Behavior/Discharge Planning: Goal: Ability to manage health-related needs will improve Outcome: Progressing   Problem: Clinical Measurements: Goal: Ability to maintain clinical measurements within normal limits will improve Outcome: Progressing Goal: Will remain free from infection Outcome: Progressing Goal: Diagnostic test results will improve Outcome: Progressing Goal: Respiratory complications will improve Outcome: Progressing Goal: Cardiovascular complication will be avoided Outcome: Progressing   Problem: Activity: Goal: Risk for activity intolerance will decrease Outcome: Progressing   Problem: Nutrition: Goal: Adequate nutrition will be maintained Outcome: Progressing   Problem: Coping: Goal: Level of anxiety will decrease Outcome: Progressing   Problem: Elimination: Goal: Will not experience complications related to bowel motility Outcome: Progressing Goal: Will not experience complications related to urinary retention Outcome: Progressing   Problem: Pain Managment: Goal: General experience of comfort will improve Outcome: Progressing   Problem: Safety: Goal: Ability to remain free from injury will improve Outcome: Progressing   Problem: Skin Integrity: Goal: Risk for impaired skin integrity will decrease Outcome: Progressing   Problem: Education: Goal: Ability to verbalize activity precautions or restrictions will improve Outcome: Progressing Goal: Knowledge of the prescribed therapeutic  regimen will improve Outcome: Progressing Goal: Understanding of discharge needs will improve Outcome: Progressing   Problem: Activity: Goal: Ability to avoid complications of mobility impairment will improve Outcome: Progressing Goal: Ability to tolerate increased activity will improve Outcome: Progressing Goal: Will remain free from falls Outcome: Progressing   Problem: Bowel/Gastric: Goal: Gastrointestinal status for postoperative course will improve Outcome: Progressing   Problem: Clinical Measurements: Goal: Ability to maintain clinical measurements within normal limits will improve Outcome: Progressing Goal: Postoperative complications will be avoided or minimized Outcome: Progressing Goal: Diagnostic test results will improve Outcome: Progressing   Problem: Pain Management: Goal: Pain level will decrease Outcome: Progressing   Problem: Skin Integrity: Goal: Will show signs of wound healing Outcome: Progressing   Problem: Health Behavior/Discharge Planning: Goal: Identification of resources available to assist in meeting health care needs will improve Outcome: Progressing   Problem: Bladder/Genitourinary: Goal: Urinary functional status for postoperative course will improve Outcome: Progressing

## 2020-07-16 NOTE — Discharge Summary (Signed)
Physician Discharge Summary  Patient ID: Blake [name] MRN: [id number] DOB/AGE: 07-09-1945 75 y.o.  Admit date: 07/13/2020 Discharge date: 07/16/2020  Admission Diagnoses: Lumbar spondylolisthesis, stenosis  Discharge Diagnoses:  Active Problems:   Spondylolisthesis   Discharged Condition: good  Hospital Course: Blake [name] was admitted on 4/15 for the L3-4 TLIF.  There were no complications and the patient was taken to the ward for postoperative care.  On postoperative day 1, his Foley catheter was removed.  Imaging was completed.  His back and leg symptoms have improved and he had ambulated.  He was having some left shoulder pain and therefore orthopedics was consulted and they felt this was related to rotator cuff.  He was tolerating a diet.  He was started on DVT prophylaxis.  On postoperative day 2, his back pain had increased but was treated with pain medication.  His shoulder pain was improving.  His drain had increased output and was left in place.  On postoperative day 3, he was seen to be at his neurologic baseline, with expected back pain but improved shoulder pain.  His drain had decreased output and was removed.  Wound VAC was also removed.  He was meeting discharge criteria at that time and was discharged home.  Consults: Hospitalist  Discharge Exam: Blood pressure (!) 152/66, pulse 75, temperature 98.1 F (36.7 C), temperature source Oral, resp. rate 18, height 5\' 5"  (1.651 m), weight 63.5 kg, SpO2 99 %.  Back: Incision flat, dry, wound VAC removed.  Drain removed.  Dressing placed Neuro: Awake, alert 5 out of 5 strength in bilateral lower extremities including hip flexion, knee flexion, knee extension, dorsiflexion or plantarflexion Intact to light touch throughout bilateral lower extremities  Disposition: Discharge disposition: 01-Home or Self Care       Discharge Instructions    Diet - low sodium heart healthy   Complete by: As directed    Diet Carb Modified    Complete by: As directed    Incentive spirometry RT   Complete by: As directed    Increase activity slowly   Complete by: As directed    No dressing needed   Complete by: As directed      Allergies as of 07/16/2020      Reactions   Sulfa Antibiotics Rash      Medication List    STOP taking these medications   cefUROXime 500 MG tablet Commonly known as: CEFTIN   HYDROcodone-acetaminophen 5-325 MG tablet Commonly known as: NORCO/VICODIN   nabumetone 500 MG tablet Commonly known as: RELAFEN     TAKE these medications   acetaminophen 325 MG tablet Commonly known as: TYLENOL Take 2 tablets (650 mg total) by mouth every 4 (four) hours as needed for mild pain ((score 1 to 3) or temp > 100.5).   atorvastatin 80 MG tablet Commonly known as: LIPITOR TAKE 1 TABLET BY MOUTH ONCE DAILY   fludrocortisone 0.1 MG tablet Commonly known as: FLORINEF TAKE 1 TABLET BY MOUTH ONCE DAILY   furosemide 20 MG tablet Commonly known as: LASIX TAKE 1 TABLET (20 MG TOTAL) BY MOUTH DAILY.   gabapentin 300 MG capsule Commonly known as: NEURONTIN TAKE 1 CAPSULE (300 MG TOTAL) BY MOUTH 3 (THREE) TIMES DAILY   lidocaine-prilocaine cream Commonly known as: EMLA APPLY TO AFFECTED AREA ONCE AS DIRECTED   lisinopril 10 MG tablet Commonly known as: ZESTRIL TAKE 1 TABLET (10 MG TOTAL) BY MOUTH DAILY.   metFORMIN 500 MG tablet Commonly known as: GLUCOPHAGE TAKE 1  TABLET BY MOUTH TWICE DAILY WITH MEALS   methocarbamol 500 MG tablet Commonly known as: ROBAXIN Take 1.5 tablets (750 mg total) by mouth every 6 (six) hours as needed for muscle spasms. What changed: how much to take   metoprolol succinate 100 MG 24 hr tablet Commonly known as: TOPROL-XL Take 100 mg by mouth daily.   mirtazapine 15 MG tablet Commonly known as: REMERON Take 1 tablet (15 mg total) by mouth nightly   omeprazole 40 MG capsule Commonly known as: PRILOSEC Take 40 mg by mouth 2 (two) times daily.   oxyCODONE 5 MG  immediate release tablet Commonly known as: Oxy IR/ROXICODONE Take 1-2 tablets (5-10 mg total) by mouth every 4 (four) hours as needed for moderate pain (5 mg for pain 4-6/10, 10 mg for 7-10/10).   promethazine 25 MG tablet Commonly known as: PHENERGAN TAKE 1 TABLET BY MOUTH EVERY 6 HOURS AS NEEDED   senna-docusate 8.6-50 MG tablet Commonly known as: Senokot-S Take 1 tablet by mouth 2 (two) times daily.   tamsulosin 0.4 MG Caps capsule Commonly known as: FLOMAX Take 0.4 mg by mouth daily after supper.            Discharge Care Instructions  (From admission, onward)         Start     Ordered   07/16/20 0000  No dressing needed        07/16/20 2060           Signed: Deetta Perla 07/16/2020, 9:30 AM

## 2020-07-18 NOTE — Anesthesia Postprocedure Evaluation (Signed)
Anesthesia Post Note  Patient: IVERSON SEES  Procedure(s) Performed: OPEN L3-4 TRANSFORAMINAL LUMBAR INTERBODY FUSION (TLIF) (N/A ) BONE MARROW ASPIRATION FOR SPINE FUSION ONLY (BMAC) (N/A )  Patient location during evaluation: PACU Anesthesia Type: General Level of consciousness: awake and alert Pain management: pain level controlled Vital Signs Assessment: post-procedure vital signs reviewed and stable Respiratory status: spontaneous breathing, nonlabored ventilation, respiratory function stable and patient connected to nasal cannula oxygen Cardiovascular status: blood pressure returned to baseline and stable Postop Assessment: no apparent nausea or vomiting Anesthetic complications: no   No complications documented.   Last Vitals:  Vitals:   07/16/20 0734 07/16/20 1149  BP: (!) 152/66 (!) 108/55  Pulse: 75 80  Resp: 18 16  Temp: 36.7 C   SpO2: 99% 100%    Last Pain:  Vitals:   07/16/20 0850  TempSrc:   PainSc: [place] Terrisa Curfman

## 2020-07-20 ENCOUNTER — Encounter: Payer: Self-pay | Admitting: Oncology

## 2020-07-20 ENCOUNTER — Inpatient Hospital Stay (HOSPITAL_BASED_OUTPATIENT_CLINIC_OR_DEPARTMENT_OTHER): Payer: Medicare Other | Admitting: Oncology

## 2020-07-20 DIAGNOSIS — Z95828 Presence of other vascular implants and grafts: Secondary | ICD-10-CM

## 2020-07-20 DIAGNOSIS — R12 Heartburn: Secondary | ICD-10-CM | POA: Diagnosis not present

## 2020-07-20 DIAGNOSIS — C159 Malignant neoplasm of esophagus, unspecified: Secondary | ICD-10-CM

## 2020-07-20 NOTE — Progress Notes (Signed)
Patient states no new concerns. 

## 2020-07-21 NOTE — Progress Notes (Signed)
Hematology/Oncology note [facility] [phone] [phone]   Patient Care Team: Idelle Crouch, MD as PCP - General (Internal Medicine) Christene Lye, MD (General Surgery) Clent Jacks, RN as Oncology Nurse Navigator  REFERRING PROVIDER: Idelle Crouch, MD  CHIEF COMPLAINTS/REASON FOR VISIT:  Follow up  esophageal cancer  HISTORY OF PRESENTING ILLNESS:   Blake [name] is a  75 y.o.  male with PMH listed below was seen in consultation at the request of  Idelle Crouch, MD  for evaluation of esophageal cancer Patient has longstanding Barrett's esophagus. 03/21/2019 patient underwent surveillance endoscopy which showed esophageal mucosal changes secondary to long segment Barrett's disease present in the lower third of the esophagus.  Maximal longitudinal extent of these mucosal changes were 7 cm in length.  Mucosa was biopsied with cold forceps for histology in 4 quadrants at the interval of 2 cm in the lower third of esophagus.  2 cm hiatal hernia, gastritis. 03/21/2019 colonoscopy showed diverticulosis in the sigmoid colon.  4 mm polyp in ascending colon was removed. 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  1 out of 4 esophagus biopsy showed Barrett's mucosa with erosion, indefinite for s. Colon polyp biopsy showed tubular adenoma. Patient was referred to cancer center for further evaluation and management.  04/06/2019 CT chest abdomen pelvis showed no evidence of metastatic disease in the chest abdomen or pelvis.  No esophageal mass lesion evident.  Small to moderate hiatal hernia with possible wall thickening proximal stomach.  3 mm left lower lobe pulmonary nodule attention on follow-up recommended to ensure stability.  Tiny hypodensities in the left kidney with attenuation too high to be simple cyst.  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 Jeb Levering.  His initial PET scan 11/08/2004 showed abnormal localization in a right neck mass possibly a lymph node, slightly asymmetric localization at the base of the tongue.  Pathology is not available in current EMR.   #06/09/19, EMR at [facility].  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 EMR specimen was received in multiple fragments and margins can not be evaluated.  1 gastric polyps were biopsied which showed fundic gland polyp. Negative for dysplasia  #08/08/19, to OR for EGD with sessile tumor from 29-33cm, at least 1.5 cm wide. Pathology with the following: 1) esophagus (30cm) - residual invasive adenocarcinoma, at least invasive to muscularis mucosa and 2) esophagus (30cm) - superficial fragments of highly dysplastic epithelium, focally suspicious for intramucosal carcinoma  #  09/12/2019, patient underwent esophagectomy with pathological revealed  Negative esophageal margin, gastric margin, esophagogastrectomy, invasive moderately differentiated esophageal adenocarcinoma involved distal esophageal and GE junction, tumor focally invades the submucosa.  Left gastric lymph node x 4 negative, greater curvature lymph nodes 1/3 is positive for 47m metastatic carcinoma.  Periesophageal lymph node x 6, negative, final pathology stage pT1b pn1, Esophageal adenocarcinoma, Stage IIB Lymph node positive diease, no neoadjuvant chemotherapy, recommended by  Dr. UFanny Skatesand was recommended for adjuvant chemotherapy.  Patient prefers to receive treatment closer to home and patient call back to establish care. No radiation was recommended due to the concern of dehiscence. 11/28/2019, patient had a stent removal.  Adjuvant chemotherapy therapy September 2021, patient underwent Xeloda 1500 mg twice daily/oxaliplatin.  He reports not tolerating well due to nausea  and diarrhea.  12/24/2019 patient's wife was diagnosed with COVID-19 infection and patient was tested negative.  Both patient and wife received monoclonal antibody treatments.  Patient prefers to switch to FOLFOX. He had Mediport placed on 01/12/2020 by Dr. Faith Rogue. He had x-ray prior and after this procedure.  Post x-ray showed extensive patchy consolidation throughout the right lung and a mild patchy opacity in the upper left lung.  Worsened.  Patient also reports having cough, as well as some chronic shortness of breath ever since his surgery at [facility] in June 2021. 01/17/2020, stat CT chest with contrast showed extensive nodular airspace process in the right lung with loss of volume and areas of subpleural atelectasis.  Likely polylobar pneumonia with aspiration.  No mediastinal or hilar mass or adenopathy.  Surgical changes Patient was advised to go to emergency room and was later admitted for pneumonia treatments.  There was concern of aspiration pneumonia.  Patient was evaluated by speech therapy.  No overt clinical signs of aspiration during p.o. intake.  Cannot rule out possibility of esophageal phase retrograde flow activity patient denies further swallowing evaluation.  Patient was discharged on 01/19/2020. He was discharged from hospital on 01/19/2020.  Our office reached out to patient to reschedule his follow-up and chemotherapy appointment and the patient wanted to defer at that time as he is planning to have a back surgery done. 02/10/2020 patient was seen by [facility] clinic gastroenterology.  Was recommended and no indication of repeat swallowing evaluation at present time. 02/24/2020, patient had L4-5 lumbar decompression, L3--4 microdiscectomy.  Per note he tolerates procedure very well. Today patient reports no new complaints.  He continues to have chronic shortness of breath with exertion h.  And follows up with Dr. Doy Hutching.  Was last seen on 03/05/2020.  Dr. Doy Hutching ordered another chest x-ray  and the result is not available to me. He is accompanied by his wife.  Patient has a plan to go vacation for 1 month in February and wants to resume rest of the chemotherapy in March 2022.  INTERVAL HISTORY Blake [name] is a 75 y.o. male who has above history reviewed by me today presents for follow up visit for management of esophageal cancer.  Problems and complaints are listed below:  Feb/March Covid pneumonia admission during his trip.  06/26/2020- 06/27/2020 admission due to chest pain. Toponin was negative, CXR negative.CT chest angiogram showed np PE, multifocal pneumonia through out right lung, improved. Increasing moderate left pleural effusion. Unchanged trace right pleural effusion.  07/12/2020 CT abdomen pelvis with contrast - no findings of active malignancy. masslike structure in the left upper quadrant represents a confluent region of fat necrosis. Not hypermetabolic on previous PET.  Nodular and bandlike opacities at right lung base have improved comparing to 06/26/2020. Substantial narrowing of right lowe lobe tracheobronchial tree. Other chronic findings.   07/16/2020 s/p spondylolisthesis.   Today patient presents virtually for follow up. He is recovering from recent surgery. Breathing has improved as well.  Chronic acid reflux symptoms.    Review of Systems  Constitutional: Negative for appetite change, chills, fatigue, fever and unexpected weight change.  HENT:   Negative for hearing loss and voice change.   Eyes: Negative for eye problems and icterus.  Respiratory: Negative for chest tightness, cough and shortness of breath.   Cardiovascular: Negative for chest pain and leg swelling.  Gastrointestinal: Negative for abdominal distention and abdominal pain.       Heart burn  Endocrine: Negative for hot flashes.  Genitourinary: Negative  for difficulty urinating, dysuria and frequency.   Musculoskeletal: Positive for back pain. Negative for arthralgias.  Skin: Negative for  itching and rash.  Neurological: Negative for light-headedness and numbness.  Hematological: Negative for adenopathy. Does not bruise/bleed easily.  Psychiatric/Behavioral: Negative for confusion.    MEDICAL HISTORY:  Past Medical History:  Diagnosis Date  . Arthritis   . Back pain    with leg pain  . Basal cell carcinoma 09/04/2009   R cheek 5.5 cm ant to earlobe - 12/26/2009 excision  . Basal cell carcinoma 09/04/2009   R ant nasal alar rim  . Basal cell carcinoma 07/31/2014   R mid brow  . Basal cell carcinoma 07/30/2016   R ant nasal alar rim at ant edge of BCC scar  . Basal cell carcinoma 11/02/2019   L zygoma   . Cancer of base of tongue ([place]) 2006   s/p chemoradiation  . 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 Dyspnea   . Dysrhythmia   . Esophageal cancer ([place]) 2021  . GERD (gastroesophageal reflux disease)   . Hyperlipidemia   . Hypertension   . Numbness and tingling of both lower extremities    with positioning  . Paroxysmal atrial fibrillation (HCC)   . Port-A-Cath in place 2006  . Prostate enlargement   . Squamous cell carcinoma of skin 12/14/2019   L pretibial - ED&C   . T2DM (type 2 diabetes mellitus) ([place])     SURGICAL HISTORY: Past Surgical History:  Procedure Laterality Date  . ABDOMINAL SURGERY    . COLONOSCOPY  12/08/2003  . COLONOSCOPY WITH PROPOFOL N/A 03/21/2019   Procedure: COLONOSCOPY WITH PROPOFOL;  Surgeon: Toledo, Benay Pike, MD;  Location: [facility] ENDOSCOPY;  Service: Gastroenterology;  Laterality: N/A;  . ESOPHAGOGASTRODUODENOSCOPY (EGD) WITH PROPOFOL N/A 03/21/2019   Procedure: ESOPHAGOGASTRODUODENOSCOPY (EGD) WITH PROPOFOL;  Surgeon: Toledo, Benay Pike, MD;  Location: [facility] ENDOSCOPY;  Service: Gastroenterology;  Laterality: N/A;  . esophogeal cancer    . JOINT REPLACEMENT Right    total hip  . LUMBAR LAMINECTOMY/DECOMPRESSION MICRODISCECTOMY N/A 03/03/2018   Procedure: LUMBAR LAMINECTOMY/DECOMPRESSION  MICRODISCECTOMY 1 LEVEL-L3-4,L4-5;  Surgeon: Meade Maw, MD;  Location: [facility] ORS;  Service: Neurosurgery;  Laterality: N/A;  . LUMBAR LAMINECTOMY/DECOMPRESSION MICRODISCECTOMY N/A 02/24/2020   Procedure: LEFT L3-4 MICRODISCECTOMY, L4-5 DECOMPRESSION;  Surgeon: Meade Maw, MD;  Location: [facility] ORS;  Service: Neurosurgery;  Laterality: N/A;  . MAXIMUM ACCESS (MAS) TRANSFORAMINAL LUMBAR INTERBODY FUSION (TLIF) 1 LEVEL N/A 07/13/2020   Procedure: OPEN L3-4 TRANSFORAMINAL LUMBAR INTERBODY FUSION (TLIF);  Surgeon: Meade Maw, MD;  Location: [facility] ORS;  Service: Neurosurgery;  Laterality: N/A;  . PARTIAL HIP ARTHROPLASTY Right 2014  . PORT-A-CATH REMOVAL    . PORTACATH PLACEMENT Left 01/12/2020   Procedure: INSERTION PORT-A-CATH, with ultrasound fluoroscopy;  Surgeon: Nestor Lewandowsky, MD;  Location: [facility] ORS;  Service: General;  Laterality: Left;  . TONGUE BIOPSY  2006  . TONSILLECTOMY    . TRIGGER FINGER RELEASE Right 2013  . UPPER GI ENDOSCOPY  01/25/14   multiple gastric polyps    SOCIAL HISTORY: Social History   Socioeconomic History  . Marital status: Married    Spouse name: Not on file  . Number of children: Not on file  . Years of education: Not on file  . Highest education level: Not on file  Occupational History  . Not on file  Tobacco Use  . Smoking status: Former Smoker    Packs/day: 1.00    Years: 30.00  Pack years: 30.00    Types: Cigarettes    Quit date: 02/18/1994    Years since quitting: 26.4  . Smokeless tobacco: Never Used  Vaping Use  . Vaping Use: Never used  Substance and Sexual Activity  . Alcohol use: Not Currently  . Drug use: No  . Sexual activity: Yes  Other Topics Concern  . Not on file  Social History Narrative  . Not on file   Social Determinants of Health   Financial Resource Strain: Not on file  Food Insecurity: Not on file  Transportation Needs: Not on file  Physical Activity: Not on file  Stress: Not on file  Social  Connections: Not on file  Intimate Partner Violence: Not on file    FAMILY HISTORY: Family History  Problem Relation Age of Onset  . Diabetes Mother   . Congestive Heart Failure Mother   . Breast cancer Mother   . Lung cancer Father     ALLERGIES:  is allergic to sulfa antibiotics.  MEDICATIONS:  Current Outpatient Medications  Medication Sig Dispense Refill  . acetaminophen (TYLENOL) 325 MG tablet Take 2 tablets (650 mg total) by mouth every 4 (four) hours as needed for mild pain ((score 1 to 3) or temp > 100.5). 60 tablet 0  . atorvastatin (LIPITOR) 80 MG tablet TAKE 1 TABLET BY MOUTH ONCE DAILY 90 tablet 3  . fludrocortisone (FLORINEF) 0.1 MG tablet TAKE 1 TABLET BY MOUTH ONCE DAILY 90 tablet 1  . furosemide (LASIX) 20 MG tablet TAKE 1 TABLET (20 MG TOTAL) BY MOUTH DAILY. 30 tablet 2  . gabapentin (NEURONTIN) 300 MG capsule TAKE 1 CAPSULE (300 MG TOTAL) BY MOUTH 3 (THREE) TIMES DAILY 90 capsule 11  . lidocaine-prilocaine (EMLA) cream APPLY TO AFFECTED AREA ONCE AS DIRECTED 30 g 3  . lisinopril (ZESTRIL) 10 MG tablet TAKE 1 TABLET (10 MG TOTAL) BY MOUTH DAILY. 30 tablet 3  . metFORMIN (GLUCOPHAGE) 500 MG tablet TAKE 1 TABLET BY MOUTH TWICE DAILY WITH MEALS 60 tablet 11  . methocarbamol (ROBAXIN) 500 MG tablet Take 1 and 1/2 tablets (750 mg total) by mouth every 6 (six) hours as needed for muscle spasms. 90 tablet 1  . metoprolol succinate (TOPROL-XL) 100 MG 24 hr tablet Take 100 mg by mouth daily.    . mirtazapine (REMERON) 15 MG tablet Take 1 tablet (15 mg total) by mouth nightly 30 tablet 11  . omeprazole (PRILOSEC) 40 MG capsule Take 40 mg by mouth 2 (two) times daily.  3  . oxyCODONE (OXY IR/ROXICODONE) 5 MG immediate release tablet Take 1-2 tablets (5-10 mg total) by mouth every 4 (four) hours as needed for moderate pain (5 mg for pain 4-6/10, 10 mg for 7-10/10). 60 tablet 0  . promethazine (PHENERGAN) 25 MG tablet TAKE 1 TABLET BY MOUTH EVERY 6 HOURS AS NEEDED 60 tablet 0  .  senna-docusate (SENOKOT-S) 8.6-50 MG tablet Take 1 tablet by mouth 2 (two) times daily. 60 tablet 0  . tamsulosin (FLOMAX) 0.4 MG CAPS capsule Take 0.4 mg by mouth daily after supper.      No current facility-administered medications for this visit.     PHYSICAL EXAMINATION: ECOG PERFORMANCE STATUS: 1 - Symptomatic but completely ambulatory There were no vitals filed for this visit. There were no vitals filed for this visit.  Physical Exam Constitutional:      General: He is not in acute distress. HENT:     Head: Normocephalic and atraumatic.  Eyes:  General: No scleral icterus.    Pupils: Pupils are equal, round, and reactive to light.  Cardiovascular:     Rate and Rhythm: Normal rate and regular rhythm.     Heart sounds: Normal heart sounds.  Pulmonary:     Effort: Pulmonary effort is normal. No respiratory distress.     Breath sounds: No wheezing.  Abdominal:     General: Bowel sounds are normal. There is no distension.     Palpations: Abdomen is soft. There is no mass.     Tenderness: There is no abdominal tenderness.  Musculoskeletal:        General: No deformity. Normal range of motion.     Cervical back: Normal range of motion and neck supple.  Skin:    General: Skin is warm and dry.     Findings: No erythema or rash.  Neurological:     Mental Status: He is alert and oriented to person, place, and time. Mental status is at baseline.     Cranial Nerves: No cranial nerve deficit.     Coordination: Coordination normal.  Psychiatric:        Mood and Affect: Mood normal.        Behavior: Behavior normal.        Thought Content: Thought content normal.     LABORATORY DATA:  I have reviewed the data as listed Lab Results  Component Value Date   WBC 9.0 07/12/2020   HGB 13.0 07/12/2020   HCT 41.6 07/12/2020   MCV 86.5 07/12/2020   PLT 288 07/12/2020   Recent Labs    12/19/19 0906 12/28/19 1328 12/28/19 1328 01/17/20 0840 06/26/20 0856 06/27/20 0505  07/12/20 1011  NA 139 137  --  135 138 139 139  K 4.4 4.1  --  4.4 4.1 3.9 4.3  CL 103 102  --  100 101 104 103  CO2 26 24  --  24 28 28 27   GLUCOSE 227* 157*  --  175* 169* 114* 140*  BUN 20 15  --  20 11 12 16   CREATININE 1.05 1.07  --  0.79 0.93 0.95 0.92  CALCIUM 8.8* 8.6*  --  8.6* 9.2 8.8* 8.8*  GFRNONAA >60 >60   < > >60 >60 >60 >60  GFRAA >60 >60  --   --   --   --   --   PROT 6.6 6.8  --  7.1  --   --  6.5  ALBUMIN 3.6 3.4*  --  2.9*  --   --  3.6  AST 17 18  --  23  --   --  17  ALT 15 16  --  24  --   --  15  ALKPHOS 64 56  --  72  --   --  56  BILITOT 0.6 0.5  --  0.5  --   --  0.4   < > = values in this interval not displayed.   Iron/TIBC/Ferritin/ %Sat No results found for: IRON, TIBC, FERRITIN, IRONPCTSAT    RADIOGRAPHIC STUDIES: I have personally reviewed the radiological images as listed and agreed with the findings in the report. DG Chest 2 View  Result Date: 06/26/2020 CLINICAL DATA:  Chest pain EXAM: CHEST - 2 VIEW COMPARISON:  01/17/2020.  PET CT 04/10/2020 FINDINGS: Asymmetric reticular nodular in consolidative density in the right lung with volume loss. Stable reticular opacities on the left. Normal heart size and stable mediastinal contours. Left-sided port with tip  at the [facility]. No acute osseous finding. IMPRESSION: No acute finding when compared to most recent imaging (PET January 2022). Electronically Signed   By: Monte Fantasia M.D.   On: 06/26/2020 09:19   DG Lumbar Spine 2-3 Views  Result Date: 07/14/2020 CLINICAL DATA:  Postop.  LOWER back pain. EXAM: LUMBAR SPINE - 2-3 VIEW COMPARISON:  07/13/2020 FINDINGS: Patient has undergone posterior transpedicular fusion at L3-4 with interbody fusion device. No acute fracture. Degenerative changes are seen throughout the LOWER thoracic and lumbar spine. There is persistent contrast in the: Following CT exam on 07/12/2020. Numerous colonic diverticula are present. IMPRESSION: Status post L3-4 fusion. Electronically  Signed   By: Nolon Nations M.D.   On: 07/14/2020 13:22   DG Lumbar Spine 2-3 Views  Result Date: 07/13/2020 CLINICAL DATA:  Lumbar spine surgery, L3-L4 TLIF EXAM: LUMBAR SPINE - 2-3 VIEW; DG C-ARM 1-60 MIN COMPARISON:  MRI lumbar spine 06/14/2020 FLUOROSCOPY TIME:  0 minutes 9 seconds Dose: 3.1193 mGy Images: 3 FINDINGS: 5 lumbar vertebra labeled on prior MR. Images demonstrate placement of BILATERAL pedicle screws and bars at L3-L4 with intervening disc prosthesis. Multilevel disc space narrowing and endplate spur formation lumbar spine. Osseous demineralization. IMPRESSION: Posterior fusion L3-L4. Electronically Signed   By: Lavonia Dana M.D.   On: 07/13/2020 15:27   CT Head Wo Contrast  Result Date: 06/26/2020 CLINICAL DATA:  Headache, suspected intracranial hemorrhage in a 75 year old male EXAM: CT HEAD WITHOUT CONTRAST TECHNIQUE: Contiguous axial images were obtained from the base of the skull through the vertex without intravenous contrast. COMPARISON:  None FINDINGS: Brain: No evidence of acute infarction, hemorrhage, hydrocephalus, extra-axial collection or mass lesion/mass effect. Signs of atrophy and chronic microvascular ischemic change. Vascular: No hyperdense vessel or unexpected calcification. Skull: Normal. Negative for fracture or focal lesion. Sinuses/Orbits: Subtle frothy secretions in the RIGHT sphenoid sinus may be indicative of mild sinusitis. The no acute findings in the orbits. Other: None. IMPRESSION: 1. No acute intracranial abnormality. 2. Signs of atrophy and chronic microvascular ischemic change. 3. Subtle frothy secretions in the RIGHT sphenoid sinus may be indicative of mild sinusitis. Electronically Signed   By: Zetta Bills M.D.   On: 06/26/2020 13:04   CT Angio Chest PE W/Cm &/Or Wo Cm  Result Date: 06/26/2020 CLINICAL DATA:  Chest pain and shortness of breath. COVID infection last month. History of esophageal cancer. EXAM: CT ANGIOGRAPHY CHEST WITH CONTRAST  TECHNIQUE: Multidetector CT imaging of the chest was performed using the standard protocol during bolus administration of intravenous contrast. Multiplanar CT image reconstructions and MIPs were obtained to evaluate the vascular anatomy. CONTRAST:  24mL OMNIPAQUE IOHEXOL 350 MG/ML SOLN COMPARISON:  Chest x-ray from same day. PET-CT dated April 10, 2020. CT chest dated January 17, 2020. FINDINGS: Cardiovascular: Satisfactory opacification of the pulmonary arteries to the segmental level. No evidence of pulmonary embolism. Mild cardiomegaly. No pericardial effusion. No thoracic aortic aneurysm. Coronary, aortic arch, and branch vessel atherosclerotic vascular disease. Unchanged left chest wall port catheter. Mediastinum/Nodes: No enlarged mediastinal, hilar, or axillary lymph nodes. Thyroid gland and trachea demonstrate no significant findings. Postsurgical changes related to partial esophagectomy and gastric pull-through again noted. Lungs/Pleura: Persistent but mildly improved patchy consolidation and clustered nodularity throughout the right lung. Increasing now moderate left pleural effusion. Unchanged trace right pleural effusion with slightly increased loculated fluid in the right minor fissure. Dependent atelectasis in the left lower lobe. No pneumothorax. Upper Abdomen: No acute abnormality. Evolving area of fat necrosis in the left  upper quadrant. Unchanged punctate calculus in the upper pole of the left kidney. Tiny gallstones. Musculoskeletal: No chest wall abnormality. No acute or significant osseous findings. Unchanged L2 hemangioma. Review of the MIP images confirms the above findings. IMPRESSION: 1. No evidence of pulmonary embolism. 2. Persistent but mildly improved multifocal pneumonia/aspiration throughout the right lung. 3. Increasing now moderate left pleural effusion. Unchanged trace right pleural effusion. 4. Aortic Atherosclerosis (ICD10-I70.0). Electronically Signed   By: Titus Dubin M.D.    On: 06/26/2020 13:16   MR THORACIC SPINE WO CONTRAST  Result Date: 06/15/2020 CLINICAL DATA:  Upper and lower back pain with pain in both legs. Pain is worse on the right, but numbness is worse on the left. Leg weakness. EXAM: MRI THORACIC SPINE WITHOUT CONTRAST MRI LUMBAR SPINE WITH AND WITHOUT CONTRAST TECHNIQUE: Multiplanar and multiecho pulse sequences of the thoracic and lumbar spine were obtained. Thoracic spine imaging was performed without contrast and lumbar spine imaging was performed with and without contrast. COMPARISON:  MRI lumbar spine January 23, 2020.  PET-CT 04/10/2020. FINDINGS: MRI THORACIC SPINE FINDINGS Alignment:  Normal. Vertebrae: Vertebral body heights are maintained. No specific evidence of acute fracture, discitis/osteomyelitis, or suspicious bone lesion. Scattered T1 hyperintense lesions throughout the visualized spine, compatible with vertebral venous malformations with characteristic thickened trabecula on prior CT chest. Cord:  Normal cord signal. Paraspinal and other soft tissues: Surgical changes of gastric pull-through.Nodular airspace disease in the right lung with small right pleural effusion, suboptimally evaluated on this study. Disc levels: Posterior disc bulges at T3-T4, T6-T7, and T7-T8 contact and indent the ventral cord without significant canal stenosis. Disc protrusions at T5-T6, T9-T10 partially efface ventral CSF without significant canal stenosis. Multilevel facet hypertrophy and right foraminal disc protrusion at T11-T12. Resulting moderate foraminal stenosis on the left at T9-T10 and the right at T11-T12. Mild foraminal stenosis on the right at T5-T6 and the left at T10-T11. No evidence of high-grade foraminal stenosis. MRI LUMBAR SPINE FINDINGS Segmentation:  Standard. Alignment:  Approximately 4 mm of retrolisthesis of L4 on L5. Vertebrae: Vertebral body heights are maintained. Degenerative/discogenic endplate signal changes about the left eccentric L3-L4  disc. No specific evidence of acute fracture, discitis/osteomyelitis, or suspicious bone lesion. 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 Conus medullaris and cauda equina: Conus extends to the L2 level. Conus appears normal Paraspinal and other soft tissues: Right renal cysts. Otherwise, unremarkable. Disc levels: T12-L1: Right foraminal disc protrusion. Mild facet hypertrophy. Mild right foraminal stenosis. No significant canal stenosis. No change. L1-L2: Broad disc bulge with superimposed right foraminal disc protrusion. Mild bilateral facet hypertrophy. No significant canal stenosis. L2-L3: Slight progression in broad disc bulge. Mild bilateral facet hypertrophy and ligamentum flavum thickening. Mildly prominent dorsal epidural fat. Resulting mild canal stenosis, slightly progressed from prior. Moderate bilateral subarticular recess stenosis, similar. Mild left foraminal stenosis. L3-L4: Postsurgical changes associated with interval left microdiscectomy. Previously seen superiorly dissecting foraminal disc protrusion is decreased in size; however, focal masslike T2 hypointense signal abnormality in the left epidural canal (for example series 1, image 10) is concerning for residual extruded disc. There is some surrounding enhancing scar tissue posteriorly. Bilateral facet hypertrophy and persistent bilateral foraminal disc protrusions. Overall, there is resulting severe canal stenosis (series 4, image 23), progressed from prior. Severe left and moderate right foraminal stenosis, not substantially changed. L4-L5: Approximately 4 mm of retrolisthesis of L4 on L5, similar. Interval posterior decompression with improved canal and subarticular recess stenosis, now widely patent. Left greater than  right endplate spurring with left foraminal disc protrusion. Similar moderate to severe left foraminal stenosis. L5-S1: Bilateral facet hypertrophy and endplate spurring. Similar moderate to severe  left and moderate right foraminal stenosis. IMPRESSION: MR LUMBAR SPINE IMPRESSION 1. Interval left L3-L4 microdiscectomy. Decreased size of the previously noted superiorly dissecting left foraminal disc protrusion; however, there is T2 hypointense masslike signal abnormality in the left epidural canal (for example, series 1, image 10), concerning for residual extruded disc material. This along with disc bulge, biforaminal disc protrusions, and facet hypertrophy results in progressive severe canal stenosis with similar severe left and moderate right foraminal stenosis. 2. Interval posterior decompression at L4-L5 with improved canal and subarticular recess stenosis, now patent. 3. Similar moderate to severe left and moderate right foraminal stenosis at L4-L5 and L5-S1. MR THORACIC SPINE IMPRESSION 1. Moderate foraminal stenosis on the left at T9-T10 and the right at T11-T12. Multilevel mild foraminal stenosis, as detailed above. 2. Disc bulges at T3-T4, T6-T7, T7-T8 contact and indent the ventral cord without significant canal stenosis. 3. Surgical changes of gastric pull-through. Partially imaged nodular airspace disease in the right lung with small right pleural effusion, suboptimally evaluated on this study and better characterized on PET-CT from 04/10/2020. Electronically Signed   By: Margaretha Sheffield MD   On: 06/15/2020 09:53   MR Lumbar Spine W Wo Contrast  Result Date: 06/15/2020 CLINICAL DATA:  Upper and lower back pain with pain in both legs. Pain is worse on the right, but numbness is worse on the left. Leg weakness. EXAM: MRI THORACIC SPINE WITHOUT CONTRAST MRI LUMBAR SPINE WITH AND WITHOUT CONTRAST TECHNIQUE: Multiplanar and multiecho pulse sequences of the thoracic and lumbar spine were obtained. Thoracic spine imaging was performed without contrast and lumbar spine imaging was performed with and without contrast. COMPARISON:  MRI lumbar spine January 23, 2020.  PET-CT 04/10/2020. FINDINGS: MRI  THORACIC SPINE FINDINGS Alignment:  Normal. Vertebrae: Vertebral body heights are maintained. No specific evidence of acute fracture, discitis/osteomyelitis, or suspicious bone lesion. Scattered T1 hyperintense lesions throughout the visualized spine, compatible with vertebral venous malformations with characteristic thickened trabecula on prior CT chest. Cord:  Normal cord signal. Paraspinal and other soft tissues: Surgical changes of gastric pull-through.Nodular airspace disease in the right lung with small right pleural effusion, suboptimally evaluated on this study. Disc levels: Posterior disc bulges at T3-T4, T6-T7, and T7-T8 contact and indent the ventral cord without significant canal stenosis. Disc protrusions at T5-T6, T9-T10 partially efface ventral CSF without significant canal stenosis. Multilevel facet hypertrophy and right foraminal disc protrusion at T11-T12. Resulting moderate foraminal stenosis on the left at T9-T10 and the right at T11-T12. Mild foraminal stenosis on the right at T5-T6 and the left at T10-T11. No evidence of high-grade foraminal stenosis. MRI LUMBAR SPINE FINDINGS Segmentation:  Standard. Alignment:  Approximately 4 mm of retrolisthesis of L4 on L5. Vertebrae: Vertebral body heights are maintained. Degenerative/discogenic endplate signal changes about the left eccentric L3-L4 disc. No specific evidence of acute fracture, discitis/osteomyelitis, or suspicious bone lesion. 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 Conus medullaris and cauda equina: Conus extends to the L2 level. Conus appears normal Paraspinal and other soft tissues: Right renal cysts. Otherwise, unremarkable. Disc levels: T12-L1: Right foraminal disc protrusion. Mild facet hypertrophy. Mild right foraminal stenosis. No significant canal stenosis. No change. L1-L2: Broad disc bulge with superimposed right foraminal disc protrusion. Mild bilateral facet hypertrophy. No significant canal  stenosis. L2-L3: Slight progression in broad disc bulge. Mild bilateral facet  hypertrophy and ligamentum flavum thickening. Mildly prominent dorsal epidural fat. Resulting mild canal stenosis, slightly progressed from prior. Moderate bilateral subarticular recess stenosis, similar. Mild left foraminal stenosis. L3-L4: Postsurgical changes associated with interval left microdiscectomy. Previously seen superiorly dissecting foraminal disc protrusion is decreased in size; however, focal masslike T2 hypointense signal abnormality in the left epidural canal (for example series 1, image 10) is concerning for residual extruded disc. There is some surrounding enhancing scar tissue posteriorly. Bilateral facet hypertrophy and persistent bilateral foraminal disc protrusions. Overall, there is resulting severe canal stenosis (series 4, image 23), progressed from prior. Severe left and moderate right foraminal stenosis, not substantially changed. L4-L5: Approximately 4 mm of retrolisthesis of L4 on L5, similar. Interval posterior decompression with improved canal and subarticular recess stenosis, now widely patent. Left greater than right endplate spurring with left foraminal disc protrusion. Similar moderate to severe left foraminal stenosis. L5-S1: Bilateral facet hypertrophy and endplate spurring. Similar moderate to severe left and moderate right foraminal stenosis. IMPRESSION: MR LUMBAR SPINE IMPRESSION 1. Interval left L3-L4 microdiscectomy. Decreased size of the previously noted superiorly dissecting left foraminal disc protrusion; however, there is T2 hypointense masslike signal abnormality in the left epidural canal (for example, series 1, image 10), concerning for residual extruded disc material. This along with disc bulge, biforaminal disc protrusions, and facet hypertrophy results in progressive severe canal stenosis with similar severe left and moderate right foraminal stenosis. 2. Interval posterior decompression  at L4-L5 with improved canal and subarticular recess stenosis, now patent. 3. Similar moderate to severe left and moderate right foraminal stenosis at L4-L5 and L5-S1. MR THORACIC SPINE IMPRESSION 1. Moderate foraminal stenosis on the left at T9-T10 and the right at T11-T12. Multilevel mild foraminal stenosis, as detailed above. 2. Disc bulges at T3-T4, T6-T7, T7-T8 contact and indent the ventral cord without significant canal stenosis. 3. Surgical changes of gastric pull-through. Partially imaged nodular airspace disease in the right lung with small right pleural effusion, suboptimally evaluated on this study and better characterized on PET-CT from 04/10/2020. Electronically Signed   By: Margaretha Sheffield MD   On: 06/15/2020 09:53   CT Abdomen Pelvis W Contrast  Result Date: 07/13/2020 CLINICAL DATA:  Esophageal cancer restaging EXAM: CT ABDOMEN AND PELVIS WITH CONTRAST TECHNIQUE: Multidetector CT imaging of the abdomen and pelvis was performed using the standard protocol following bolus administration of intravenous contrast. CONTRAST:  120mL OMNIPAQUE IOHEXOL 300 MG/ML  SOLN COMPARISON:  Chest CT 06/26/2020 and PET-CT from 04/10/2020 FINDINGS: Lower chest: Nodular and bandlike opacities in the right middle lobe and right lower lobe mildly improved compared to 06/26/2020 and probably from atypical infectious process, there is substantial narrowing of the right lower lobe tracheobronchial tree centrally. Gastric pull-through observed with much of the stomach in the chest. Mild cardiomegaly. Hepatobiliary: Unremarkable Pancreas: Unremarkable Spleen: Unremarkable Adrenals/Urinary Tract: Unremarkable Stomach/Bowel: Most of the stomach is intrathoracic. Appendix normal. Mild sigmoid colon diverticulosis. Vascular/Lymphatic: Aortoiliac atherosclerotic vascular disease. No pathologic adenopathy identified. Reproductive: Unremarkable, partially obscured by streak artifact from the patient's right hip hardware. Other:  9.3 by 5.3 by 4.1 cm masslike structure in the left upper quadrant as shown on image 15 of series 2. This has not been intervally hypermetabolic and based on the appearance on image 123 of series 2 of the 01/17/2020 exam, I expect that this represents a region of confluent fat necrosis in the left upper quadrant which likely occurred postoperatively. This also extends tangential to the pancreatic body and could have been a result  of prior localized pancreatitis. On the current exam internal fat densities are slightly less appreciable than on prior exams. Based on the of illusion of this lesion I favor it being benign, but surveillance is probably prudent. Musculoskeletal: Bridging spurring of both sacroiliac joints. Right hip prosthesis. Lumbar hemangiomas, along with lumbar spondylosis and degenerative disc disease causing multilevel lumbar impingement. IMPRESSION: 1. No findings of active malignancy. 2. Masslike structure in the left upper quadrant appears on the basis of current and prior imaging to represent a confluent region of fat necrosis. This was not hypermetabolic on prior PET-CT. Given that this is partially tangential to the pancreas, prior pancreatitis may have contributed to this appearance. 3. Nodular and bandlike opacities at the right lung base have mildly improved compared to 06/26/2020, probably from atypical infection. There is still substantial narrowing of the right lower lobe tracheobronchial tree. 4. Other imaging findings of potential clinical significance: Mild cardiomegaly. Aortic Atherosclerosis (ICD10-I70.0). Gastric pull-through. Multilevel lumbar impingement. Electronically Signed   By: Van Clines M.D.   On: 07/13/2020 15:06   DG Shoulder Left  Result Date: 07/14/2020 CLINICAL DATA:  LOWER back pain. LEFT shoulder pain and difficulty with abduction since surgery. EXAM: LEFT SHOULDER - 2+ VIEW COMPARISON:  Chest x-ray on 06/26/2020 FINDINGS: Moderate degenerative changes in  the LEFT shoulder with subacromial spurring, subacromial narrowing, in clinic humeral osteophytes. There is no acute fracture or subluxation. LEFT lung apex is unremarkable. Note is made of power port overlying the LEFT chest. IMPRESSION: Degenerative changes of the LEFT shoulder.  No acute abnormality. Electronically Signed   By: Nolon Nations M.D.   On: 07/14/2020 13:20   DG PAIN CLINIC C-ARM 1-60 MIN NO REPORT  Result Date: 06/11/2020 Fluoro was used, but no Radiologist interpretation will be provided. Please refer to "NOTES" tab for provider progress note.  DG C-Arm 1-60 Min  Result Date: 07/13/2020 CLINICAL DATA:  Lumbar spine surgery, L3-L4 TLIF EXAM: LUMBAR SPINE - 2-3 VIEW; DG C-ARM 1-60 MIN COMPARISON:  MRI lumbar spine 06/14/2020 FLUOROSCOPY TIME:  0 minutes 9 seconds Dose: 3.1193 mGy Images: 3 FINDINGS: 5 lumbar vertebra labeled on prior MR. Images demonstrate placement of BILATERAL pedicle screws and bars at L3-L4 with intervening disc prosthesis. Multilevel disc space narrowing and endplate spur formation lumbar spine. Osseous demineralization. IMPRESSION: Posterior fusion L3-L4. Electronically Signed   By: Lavonia Dana M.D.   On: 07/13/2020 15:27   DG C-Arm 1-60 Min  Result Date: 07/13/2020 CLINICAL DATA:  Lumbar spine surgery, L3-L4 TLIF EXAM: LUMBAR SPINE - 2-3 VIEW; DG C-ARM 1-60 MIN COMPARISON:  MRI lumbar spine 06/14/2020 FLUOROSCOPY TIME:  0 minutes 9 seconds Dose: 3.1193 mGy Images: 3 FINDINGS: 5 lumbar vertebra labeled on prior MR. Images demonstrate placement of BILATERAL pedicle screws and bars at L3-L4 with intervening disc prosthesis. Multilevel disc space narrowing and endplate spur formation lumbar spine. Osseous demineralization. IMPRESSION: Posterior fusion L3-L4. Electronically Signed   By: Lavonia Dana M.D.   On: 07/13/2020 15:27   DG C-Arm 1-60 Min  Result Date: 07/13/2020 CLINICAL DATA:  Lumbar spine surgery, L3-L4 TLIF EXAM: LUMBAR SPINE - 2-3 VIEW; DG C-ARM 1-60  MIN COMPARISON:  MRI lumbar spine 06/14/2020 FLUOROSCOPY TIME:  0 minutes 9 seconds Dose: 3.1193 mGy Images: 3 FINDINGS: 5 lumbar vertebra labeled on prior MR. Images demonstrate placement of BILATERAL pedicle screws and bars at L3-L4 with intervening disc prosthesis. Multilevel disc space narrowing and endplate spur formation lumbar spine. Osseous demineralization. IMPRESSION: Posterior fusion L3-L4. Electronically Signed  By: Lavonia Dana M.D.   On: 07/13/2020 15:27   VAS US CAROTID  Result Date: 05/01/2020 Carotid Arterial Duplex Study Risk Factors:      Hyperlipidemia. Other Factors:     Carotid stenosis. Comparison Study:  11/08/2019 Performing Technologist: Charlane Ferretti RT (R)(VS)  Examination Guidelines: A complete evaluation includes B-mode imaging, spectral Doppler, color Doppler, and power Doppler as needed of all accessible portions of each vessel. Bilateral testing is considered an integral part of a complete examination. Limited examinations for reoccurring indications may be performed as noted.  Right Carotid Findings: +----------+--------+--------+--------+------------------+--------------------+           PSV cm/sEDV cm/sStenosisPlaque DescriptionComments             +----------+--------+--------+--------+------------------+--------------------+ CCA Prox  166     13                                tortuous             +----------+--------+--------+--------+------------------+--------------------+ CCA Mid   89      16                                tortuous             +----------+--------+--------+--------+------------------+--------------------+ CCA Distal100     19              calcific          tortuous             +----------+--------+--------+--------+------------------+--------------------+ ICA Prox  159     44                                ICS/CCA ratio = 2.21 +----------+--------+--------+--------+------------------+--------------------+ ICA Mid   211      57                                                     +----------+--------+--------+--------+------------------+--------------------+ ICA Distal102     28                                                     +----------+--------+--------+--------+------------------+--------------------+ ECA       171     14                                                     +----------+--------+--------+--------+------------------+--------------------+ +----------+--------+-------+-----------+-------------------+           PSV cm/sEDV cmsDescribe   Arm Pressure (mmHG) +----------+--------+-------+-----------+-------------------+ [id number]            Multiphasic                    +----------+--------+-------+-----------+-------------------+ +---------+--------+--+--------+--+---------+ VertebralPSV cm/s56EDV cm/s14Antegrade +---------+--------+--+--------+--+---------+ Left Carotid Findings: +----------+--------+--------+--------+------------------+--------------------+           PSV cm/sEDV cm/sStenosisPlaque DescriptionComments             +----------+--------+--------+--------+------------------+--------------------+  CCA Prox  141     21                                tortuous             +----------+--------+--------+--------+------------------+--------------------+ CCA Mid   114     18                                tortuous             +----------+--------+--------+--------+------------------+--------------------+ CCA Distal125     19              calcific          intimal thickening   +----------+--------+--------+--------+------------------+--------------------+ ICA Prox  263     66              calcific          ICA/CCA ratio = 2.10 +----------+--------+--------+--------+------------------+--------------------+ ICA Mid   130     31                                                      +----------+--------+--------+--------+------------------+--------------------+ ICA Distal135     36                                                     +----------+--------+--------+--------+------------------+--------------------+ ECA       148     13                                                     +----------+--------+--------+--------+------------------+--------------------+ +----------+--------+--------+-----------+-------------------+           PSV cm/sEDV cm/sDescribe   Arm Pressure (mmHG) +----------+--------+--------+-----------+-------------------+ Subclavian227             Multiphasic                    +----------+--------+--------+-----------+-------------------+ +---------+--------+--+--------+-+---------+ VertebralPSV cm/s41EDV cm/s7Antegrade +---------+--------+--+--------+-+---------+ Summary: Right Carotid: Velocities in the right ICA are consistent with a 60-79%                stenosis. Non-hemodynamically significant plaque <50% noted in                the CCA. The ECA appears >50% stenosed. Left Carotid: Velocities in the left ICA are consistent with a 60-79% stenosis.               Non-hemodynamically significant plaque <50% noted in the CCA. The               ECA appears <50% stenosed. Vertebrals:  Bilateral vertebral arteries demonstrate antegrade flow. Subclavians: Left subclavian artery was stenotic. Normal flow hemodynamics were              seen in the right subclavian artery. *See table(s) above for measurements and observations.  Electronically signed by Leotis Pain MD on 05/01/2020 at 11:52:59 AM.  Final        ASSESSMENT & PLAN:  1. 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[place])   2. Port-A-Cath in place   3. Heartburn   Cancer Staging Malignant neoplasm of lower third of esophagus (HCC) Staging form: Esophagus - Adenocarcinoma, AJCC 8th Edition - Clinical: No stage assigned - Unsigned - Pathologic stage from 12/09/2019: Stage IIB (pT1b, pN1, cM0, G2)  - Signed by Earlie Server, MD on 12/09/2019   #Esophageal adenocarcinoma, Stage IIB Patient is status post 1 cycle of Xeloda plus oxaliplatin treatment.  He did not tolerate well due to GI side effects and the original plan is to switch to FOLFOX.  He opted not to proceed with with additional chemotherapy due to delay from aspiration pneumonia, back surgery, vacation plan etc.  Nov 2021 PET NED March/April CT showed no active malignancy CEA is stable. Continue surveillance.   GERD PPI Recommend EGD surveillance- patient prefers to see gastroenterology for EGD. Will communicate with Dr.Toledo.   #Port-A-Cath in place, continue port flush every 8 weeks All questions were answered. The patient knows to call the clinic with any problems questions or concerns.  cc Idelle Crouch, MD  Follow-up 3 months.   we spent sufficient time to discuss many aspect of care, questions were answered to patient's satisfaction.   Earlie Server, MD, PhD Hematology Oncology [facility] at [facility] Pager- [phone] 07/21/2020

## 2020-07-25 ENCOUNTER — Other Ambulatory Visit: Payer: Self-pay

## 2020-07-25 MED ORDER — OMEPRAZOLE 40 MG PO CPDR
DELAYED_RELEASE_CAPSULE | ORAL | 3 refills | Status: DC
  Filled 2020-07-25: qty 90, 90d supply, fill #0

## 2020-07-25 MED FILL — Lisinopril Tab 10 MG: ORAL | 30 days supply | Qty: 30 | Fill #0 | Status: AC

## 2020-07-26 ENCOUNTER — Other Ambulatory Visit: Payer: Self-pay

## 2020-08-14 ENCOUNTER — Other Ambulatory Visit: Payer: Self-pay

## 2020-08-28 ENCOUNTER — Other Ambulatory Visit: Payer: Self-pay

## 2020-08-28 MED ORDER — METOPROLOL SUCCINATE ER 100 MG PO TB24
ORAL_TABLET | ORAL | 1 refills | Status: DC
  Filled 2020-08-28: qty 90, 90d supply, fill #0

## 2020-08-28 MED FILL — Amiodarone HCl Tab 200 MG: ORAL | 90 days supply | Qty: 90 | Fill #0 | Status: AC

## 2020-08-31 ENCOUNTER — Inpatient Hospital Stay: Payer: Medicare Other | Attending: Oncology

## 2020-08-31 ENCOUNTER — Other Ambulatory Visit: Payer: Self-pay

## 2020-08-31 DIAGNOSIS — Z452 Encounter for adjustment and management of vascular access device: Secondary | ICD-10-CM | POA: Diagnosis not present

## 2020-08-31 DIAGNOSIS — Z95828 Presence of other vascular implants and grafts: Secondary | ICD-10-CM

## 2020-08-31 DIAGNOSIS — C155 Malignant neoplasm of lower third of esophagus: Secondary | ICD-10-CM | POA: Insufficient documentation

## 2020-08-31 MED ORDER — SODIUM CHLORIDE 0.9% FLUSH
10.0000 mL | Freq: Once | INTRAVENOUS | Status: AC
  Administered 2020-08-31: 10 mL via INTRAVENOUS
  Filled 2020-08-31: qty 10

## 2020-08-31 MED ORDER — HEPARIN SOD (PORK) LOCK FLUSH 100 UNIT/ML IV SOLN
INTRAVENOUS | Status: AC
  Filled 2020-08-31: qty 5

## 2020-08-31 MED ORDER — HEPARIN SOD (PORK) LOCK FLUSH 100 UNIT/ML IV SOLN
500.0000 [IU] | Freq: Once | INTRAVENOUS | Status: AC
  Administered 2020-08-31: 500 [IU] via INTRAVENOUS
  Filled 2020-08-31: qty 5

## 2020-08-31 NOTE — Patient Instructions (Signed)
[facility] ONCOLOG[facility]  Discharge Instructions: Thank you for choosing [facility] to provide your oncology and hematology care.  If you have a lab appointment with the [facility], please go directly to the [facility] and check in at the registration area.  Wear comfortable clothing and clothing appropriate for easy access to any Portacath or PICC line.   We strive to give you quality time with your provider. You may need to reschedule your appointment if you arrive late (15 or more minutes).  Arriving late affects you and other patients whose appointments are after yours.  Also, if you miss three or more appointments without notifying the office, you may be dismissed from the clinic at the provider's discretion.      For prescription refill requests, have your pharmacy contact our office and allow 72 hours for refills to be completed.    Today you received the following port a cath flush   To help prevent nausea and vomiting after your treatment, we encourage you to take your nausea medication as directed.  BELOW ARE SYMPTOMS THAT SHOULD BE REPORTED IMMEDIATELY: . *FEVER GREATER THAN 100.4 F (38 C) OR HIGHER . *CHILLS OR SWEATING . *NAUSEA AND VOMITING THAT IS NOT CONTROLLED WITH YOUR NAUSEA MEDICATION . *UNUSUAL SHORTNESS OF BREATH . *UNUSUAL BRUISING OR BLEEDING . *URINARY PROBLEMS (pain or burning when urinating, or frequent urination) . *BOWEL PROBLEMS (unusual diarrhea, constipation, pain near the anus) . TENDERNESS IN MOUTH AND THROAT WITH OR WITHOUT PRESENCE OF ULCERS (sore throat, sores in mouth, or a toothache) . UNUSUAL RASH, SWELLING OR PAIN  . UNUSUAL VAGINAL DISCHARGE OR ITCHING   Items with * indicate a potential emergency and should be followed up as soon as possible or go to the Emergency Department if any problems should occur.  Please show the CHEMOTHERAPY ALERT CARD or IMMUNOTHERAPY ALERT CARD at check-in to the Emergency  Department and triage nurse.  Should you have questions after your visit or need to cancel or reschedule your appointment, please contact [facility]  [phone] and follow the prompts.  Office hours are 8:00 a.m. to 4:30 p.m. Monday - Friday. Please note that voicemails left after 4:00 p.m. may not be returned until the following business day.  We are closed weekends and major holidays. You have access to a nurse at all times for urgent questions. Please call the main number to the clinic [phone] and follow the prompts.  For any non-urgent questions, you may also contact your provider using MyChart. We now offer e-Visits for anyone 29 and older to request care online for non-urgent symptoms. For details visit [email or web address].   Also download the MyChart app! Go to the app store, search "MyChart", open the app, select [facility], and log in with your MyChart username and password.  Due to Covid, a mask is required upon entering the hospital/clinic. If you do not have a mask, one will be given to you upon arrival. For doctor visits, patients may have 1 support person aged 74 or older with them. For treatment visits, patients cannot have anyone with them due to current Covid guidelines and our immunocompromised population.

## 2020-09-24 ENCOUNTER — Other Ambulatory Visit: Payer: Self-pay

## 2020-10-18 ENCOUNTER — Other Ambulatory Visit: Payer: Self-pay

## 2020-10-18 ENCOUNTER — Ambulatory Visit (INDEPENDENT_AMBULATORY_CARE_PROVIDER_SITE_OTHER): Payer: Medicare Other | Admitting: Dermatology

## 2020-10-18 DIAGNOSIS — L820 Inflamed seborrheic keratosis: Secondary | ICD-10-CM

## 2020-10-18 DIAGNOSIS — Z1283 Encounter for screening for malignant neoplasm of skin: Secondary | ICD-10-CM

## 2020-10-18 DIAGNOSIS — L570 Actinic keratosis: Secondary | ICD-10-CM | POA: Diagnosis not present

## 2020-10-18 DIAGNOSIS — D1800 Hemangioma unspecified site: Secondary | ICD-10-CM

## 2020-10-18 DIAGNOSIS — I6523 Occlusion and stenosis of bilateral carotid arteries: Secondary | ICD-10-CM

## 2020-10-18 DIAGNOSIS — L814 Other melanin hyperpigmentation: Secondary | ICD-10-CM

## 2020-10-18 DIAGNOSIS — L578 Other skin changes due to chronic exposure to nonionizing radiation: Secondary | ICD-10-CM

## 2020-10-18 DIAGNOSIS — L821 Other seborrheic keratosis: Secondary | ICD-10-CM

## 2020-10-18 DIAGNOSIS — L719 Rosacea, unspecified: Secondary | ICD-10-CM

## 2020-10-18 DIAGNOSIS — L219 Seborrheic dermatitis, unspecified: Secondary | ICD-10-CM

## 2020-10-18 DIAGNOSIS — D229 Melanocytic nevi, unspecified: Secondary | ICD-10-CM

## 2020-10-18 DIAGNOSIS — D692 Other nonthrombocytopenic purpura: Secondary | ICD-10-CM

## 2020-10-18 DIAGNOSIS — Z8501 Personal history of malignant neoplasm of esophagus: Secondary | ICD-10-CM

## 2020-10-18 DIAGNOSIS — Z85828 Personal history of other malignant neoplasm of skin: Secondary | ICD-10-CM

## 2020-10-18 DIAGNOSIS — L711 Rhinophyma: Secondary | ICD-10-CM

## 2020-10-18 MED ORDER — HYDROCORTISONE 2.5 % EX LOTN
TOPICAL_LOTION | CUTANEOUS | 3 refills | Status: DC
  Filled 2020-10-18: qty 59, 30d supply, fill #0

## 2020-10-18 NOTE — Progress Notes (Signed)
Follow-Up Visit   Subjective  Blake [name] is a 75 y.o. male who presents for the following: Annual Exam. The patient presents for Total-Body Skin Exam (TBSE) for skin cancer screening and mole check.  The following portions of the chart were reviewed this encounter and updated as appropriate:   Tobacco  Allergies  Meds  Problems  Med Hx  Surg Hx  Fam Hx     Review of Systems:  No other skin or systemic complaints except as noted in HPI or Assessment and Plan.  Objective  Well appearing patient in no apparent distress; mood and affect are within normal limits.  A full examination was performed including scalp, head, eyes, ears, nose, lips, neck, chest, axillae, abdomen, back, buttocks, bilateral upper extremities, bilateral lower extremities, hands, feet, fingers, toes, fingernails, and toenails. All findings within normal limits unless otherwise noted below.  Face Redness of the face with thickening of the skin on the nose.   Scalp x 1 Erythematous thin papules/macules with gritty scale.   R back x 1 Erythematous keratotic or waxy stuck-on papule or plaque.    Assessment & Plan  Rosacea Face With rhinophyma -  Start [facility] mix BID. Rosacea is a chronic progressive skin condition usually affecting the face of adults, causing redness and/or acne bumps. It is treatable but not curable. It sometimes affects the eyes (ocular rosacea) as well. It may respond to topical and/or systemic medication and can flare with stress, sun exposure, alcohol, exercise and some foods.  Daily application of broad spectrum spf 30+ sunscreen to face is recommended to reduce flares.  AK (actinic keratosis) Scalp x 1 Destruction of lesion - Scalp x 1 Complexity: simple   Destruction method: cryotherapy   Informed consent: discussed and consent obtained   Timeout:  patient name, date of birth, surgical site, and procedure verified Lesion destroyed using liquid nitrogen: Yes    Region frozen until ice ball extended beyond lesion: Yes   Outcome: patient tolerated procedure well with no complications   Post-procedure details: wound care instructions given    Seborrheic dermatitis Face Seborrheic Dermatitis  -  is a chronic persistent rash characterized by pinkness and scaling most commonly of the mid face but also can occur on the scalp (dandruff), ears; mid chest and mid back. It tends to be exacerbated by stress and cooler weather.  People who have neurologic disease may experience new onset or exacerbation of existing seborrheic dermatitis.  The condition is not curable but treatable and can be controlled.  Start HC 2.5% lotion QHS on Monday, Wednesday, Friday. Topical steroids (such as triamcinolone, fluocinolone, fluocinonide, mometasone, clobetasol, halobetasol, betamethasone, hydrocortisone) can cause thinning and lightening of the skin if they are used for too long in the same area. Your physician has selected the right strength medicine for your problem and area affected on the body. Please use your medication only as directed by your physician to prevent side effects.   hydrocortisone 2.5 % lotion - Face For seborrheic dermatitis. Apply to affected area on face every Monday, Wednesday, and Friday.  Inflamed seborrheic keratosis R back x 1 Destruction of lesion - R back x 1 Complexity: simple   Destruction method: cryotherapy   Informed consent: discussed and consent obtained   Timeout:  patient name, date of birth, surgical site, and procedure verified Lesion destroyed using liquid nitrogen: Yes   Region frozen until ice ball extended beyond lesion: Yes   Outcome: patient tolerated procedure well with no  complications   Post-procedure details: wound care instructions given    History of esophageal cancer -status post surgery Esophagus Patient states that he is doing well now. Continue care with oncologist as recommended.  Lentigines - Scattered tan  macules - Due to sun exposure - Benign-appering, observe - Recommend daily broad spectrum sunscreen SPF 30+ to sun-exposed areas, reapply every 2 hours as needed. - Call for any changes  Seborrheic Keratoses - Stuck-on, waxy, tan-brown papules and/or plaques  - Benign-appearing - Discussed benign etiology and prognosis. - Observe - Call for any changes  Melanocytic Nevi - Tan-brown and/or pink-flesh-colored symmetric macules and papules - Benign appearing on exam today - Observation - Call clinic for new or changing moles - Recommend daily use of broad spectrum spf 30+ sunscreen to sun-exposed areas.   Hemangiomas - Red papules - Discussed benign nature - Observe - Call for any changes  Actinic Damage - Chronic condition, secondary to cumulative UV/sun exposure - diffuse scaly erythematous macules with underlying dyspigmentation - Recommend daily broad spectrum sunscreen SPF 30+ to sun-exposed areas, reapply every 2 hours as needed.  - Staying in the shade or wearing long sleeves, sun glasses (UVA+UVB protection) and wide brim hats (4-inch brim around the entire circumference of the hat) are also recommended for sun protection.  - Call for new or changing lesions.  History of Squamous Cell Carcinoma of the Skin - No evidence of recurrence today - No lymphadenopathy - Recommend regular full body skin exams - Recommend daily broad spectrum sunscreen SPF 30+ to sun-exposed areas, reapply every 2 hours as needed.  - Call if any new or changing lesions are noted between office visits  History of Basal Cell Carcinoma of the Skin - No evidence of recurrence today - Recommend regular full body skin exams - Recommend daily broad spectrum sunscreen SPF 30+ to sun-exposed areas, reapply every 2 hours as needed.  - Call if any new or changing lesions are noted between office visits  Purpura - Chronic; persistent and recurrent.  Treatable, but not curable. - Violaceous macules and  patches - Benign - Related to trauma, age, sun damage and/or use of blood thinners, chronic use of topical and/or oral steroids - Observe - Can use OTC arnica containing moisturizer such as Dermend Bruise Formula if desired - Call for worsening or other concerns  Skin cancer screening performed today.  Return in about 1 year (around 10/18/2021) for TBSE.  Luther Redo, CMA, am acting as scribe for Sarina Ser, MD . Documentation: I have reviewed the above documentation for accuracy and completeness, and I agree with the above.  Sarina Ser, MD

## 2020-10-18 NOTE — Patient Instructions (Addendum)
If you have any questions or concerns for your doctor, please call our main line at [phone] and press option 4 to reach your doctor's medical assistant. If no one answers, please leave a voicemail as directed and we will return your call as soon as possible. Messages left after 4 pm will be answered the following business day.   You may also send us a message via MyChart. We typically respond to MyChart messages within 1-2 business days.  For prescription refills, please ask your pharmacy to contact our office. Our fax number is [phone].  If you have an urgent issue when the clinic is closed that cannot wait until the next business day, you can page your doctor at the number below.    Please note that while we do our best to be available for urgent issues outside of office hours, we are not available 24/7.   If you have an urgent issue and are unable to reach us, you may choose to seek medical care at your doctor's office, retail clinic, urgent care center, or emergency room.  If you have a medical emergency, please immediately call 911 or go to the emergency department.  Pager Numbers  - Dr. Kowalski: [phone]  - Dr. Moye: [phone]  - Dr. Stewart: [phone]  In the event of inclement weather, please call our main line at [phone] for an update on the status of any delays or closures.  Dermatology Medication Tips: Please keep the boxes that topical medications come in in order to help keep track of the instructions about where and how to use these. Pharmacies typically print the medication instructions only on the boxes and not directly on the medication tubes.   If your medication is too expensive, please contact our office at [phone] option 4 or send us a message through MyChart.   We are unable to tell what your co-pay for medications will be in advance as this is different depending on your insurance coverage. However, we may be able to find a substitute  medication at lower cost or fill out paperwork to get insurance to cover a needed medication.   If a prior authorization is required to get your medication covered by your insurance company, please allow us 1-2 business days to complete this process.  Drug prices often vary depending on where the prescription is filled and some pharmacies may offer cheaper prices.  The website www.goodrx.com contains coupons for medications through different pharmacies. The prices here do not account for what the cost may be with help from insurance (it may be cheaper with your insurance), but the website can give you the price if you did not use any insurance.  - You can print the associated coupon and take it with your prescription to the pharmacy.  - You may also stop by our office during regular business hours and pick up a GoodRx coupon card.  - If you need your prescription sent electronically to a different pharmacy, notify our office through [facility] MyChart or by phone at [phone] option 4.  Instructions for [facility] Medications  One or more of your medications was sent to the [facility] mail order compounding pharmacy. You will receive an email from them and can purchase the medicine through that link. It will then be mailed to your home at the address you confirmed. If for any reason you do not receive an email from them, please check your spam folder. If you still do not find the email,   please let us know. [facility] phone number is [phone].   

## 2020-10-19 ENCOUNTER — Other Ambulatory Visit: Payer: Self-pay

## 2020-10-20 ENCOUNTER — Encounter: Payer: Self-pay | Admitting: Dermatology

## 2020-10-23 ENCOUNTER — Encounter (INDEPENDENT_AMBULATORY_CARE_PROVIDER_SITE_OTHER): Payer: Self-pay | Admitting: Vascular Surgery

## 2020-10-23 ENCOUNTER — Other Ambulatory Visit: Payer: Self-pay

## 2020-10-23 ENCOUNTER — Ambulatory Visit (INDEPENDENT_AMBULATORY_CARE_PROVIDER_SITE_OTHER): Payer: Medicare Other

## 2020-10-23 ENCOUNTER — Ambulatory Visit (INDEPENDENT_AMBULATORY_CARE_PROVIDER_SITE_OTHER): Payer: Medicare Other | Admitting: Vascular Surgery

## 2020-10-23 VITALS — BP 118/65 | HR 73 | Resp 16 | Wt 147.4 lb

## 2020-10-23 DIAGNOSIS — I6523 Occlusion and stenosis of bilateral carotid arteries: Secondary | ICD-10-CM | POA: Diagnosis not present

## 2020-10-23 DIAGNOSIS — C159 Malignant neoplasm of esophagus, unspecified: Secondary | ICD-10-CM | POA: Diagnosis not present

## 2020-10-23 DIAGNOSIS — C155 Malignant neoplasm of lower third of esophagus: Secondary | ICD-10-CM | POA: Diagnosis not present

## 2020-10-23 DIAGNOSIS — E785 Hyperlipidemia, unspecified: Secondary | ICD-10-CM

## 2020-10-23 DIAGNOSIS — I10 Essential (primary) hypertension: Secondary | ICD-10-CM | POA: Diagnosis not present

## 2020-10-23 MED ORDER — TAMSULOSIN HCL 0.4 MG PO CAPS
ORAL_CAPSULE | ORAL | 3 refills | Status: DC
  Filled 2020-10-23: qty 90, 90d supply, fill #0
  Filled 2021-02-04: qty 90, 90d supply, fill #1
  Filled 2021-05-10: qty 90, 90d supply, fill #2
  Filled 2021-08-21: qty 90, 90d supply, fill #3

## 2020-10-23 NOTE — Progress Notes (Signed)
MRN : [id number]  Blake [name] is a 75 y.o. (1945-06-09) male who presents with chief complaint of  Chief Complaint  Patient presents with   Follow-up    Ultrasound follow up  .  History of Present Illness: patient returns in follow up of his carotid disease.  He has had back surgeries, Covid and several other issues since his last visit, but no focal neurologic symptoms. Specifically, the patient denies amaurosis fugax, speech or swallowing difficulties, or arm or leg weakness or numbness. Carotid duplex shows stable 60-79% ICA stenosis bilaterally.   Current Outpatient Medications  Medication Sig Dispense Refill   acetaminophen (TYLENOL) 325 MG tablet Take 2 tablets (650 mg total) by mouth every 4 (four) hours as needed for mild pain ((score 1 to 3) or temp > 100.5). 60 tablet 0   amiodarone (PACERONE) 200 MG tablet TAKE 1 TABLET BY MOUTH ONCE DAILY 90 tablet 1   atorvastatin (LIPITOR) 80 MG tablet TAKE 1 TABLET BY MOUTH ONCE DAILY 90 tablet 3   fludrocortisone (FLORINEF) 0.1 MG tablet TAKE 1 TABLET BY MOUTH ONCE DAILY 90 tablet 1   furosemide (LASIX) 20 MG tablet TAKE 1 TABLET (20 MG TOTAL) BY MOUTH DAILY. 30 tablet 2   gabapentin (NEURONTIN) 300 MG capsule TAKE 1 CAPSULE (300 MG TOTAL) BY MOUTH 3 (THREE) TIMES DAILY 90 capsule 11   hydrocortisone 2.5 % lotion For seborrheic dermatitis. Apply to affected area on face every Monday, Wednesday, and Friday. 59 mL 3   lidocaine-prilocaine (EMLA) cream APPLY TO AFFECTED AREA ONCE AS DIRECTED 30 g 3   lisinopril (ZESTRIL) 10 MG tablet TAKE 1 TABLET (10 MG TOTAL) BY MOUTH DAILY. 30 tablet 3   methocarbamol (ROBAXIN) 500 MG tablet Take 1 and 1/2 tablets (750 mg total) by mouth every 6 (six) hours as needed for muscle spasms. 90 tablet 1   metoprolol succinate (TOPROL-XL) 100 MG 24 hr tablet Take 100 mg by mouth daily.     metoprolol succinate (TOPROL-XL) 100 MG 24 hr tablet Take one tablet by mouth once daily 90 tablet 1   mirtazapine  (REMERON) 15 MG tablet Take 1 tablet (15 mg total) by mouth nightly 30 tablet 11   omeprazole (PRILOSEC) 40 MG capsule Take 40 mg by mouth 2 (two) times daily.  3   omeprazole (PRILOSEC) 40 MG capsule take one capsule by mouth once daily 90 capsule 3   oxyCODONE (OXY IR/ROXICODONE) 5 MG immediate release tablet Take 1-2 tablets (5-10 mg total) by mouth every 4 (four) hours as needed for moderate pain (5 mg for pain 4-6/10, 10 mg for 7-10/10). 60 tablet 0   promethazine (PHENERGAN) 25 MG tablet TAKE 1 TABLET BY MOUTH EVERY 6 HOURS AS NEEDED 60 tablet 0   senna-docusate (SENOKOT-S) 8.6-50 MG tablet Take 1 tablet by mouth 2 (two) times daily. 60 tablet 0   tamsulosin (FLOMAX) 0.4 MG CAPS capsule Take 0.4 mg by mouth daily after supper.      metFORMIN (GLUCOPHAGE) 500 MG tablet TAKE 1 TABLET BY MOUTH TWICE DAILY WITH MEALS 60 tablet 11   No current facility-administered medications for this visit.    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, bilateral    Dyspnea    Dysrhythmia    Esophageal cancer ([place]) 2021   GERD (gastroesophageal reflux disease)    Hyperlipidemia    Hypertension    Numbness and tingling of both lower extremities    with positioning   Paroxysmal atrial fibrillation ([place])    Port-A-Cath in place 2006   Prostate enlargement    Squamous cell carcinoma of skin 12/14/2019   L pretibial - ED&C    T2DM (type 2 diabetes mellitus) ([place])     Past Surgical History:  Procedure Laterality Date   ABDOMINAL  SURGERY     COLONOSCOPY  12/08/2003   COLONOSCOPY WITH PROPOFOL N/A 03/21/2019   Procedure: COLONOSCOPY WITH PROPOFOL;  Surgeon: Toledo, Benay Pike, MD;  Location: [facility];  Service: Gastroenterology;  Laterality: N/A;   ESOPHAGOGASTRODUODENOSCOPY (EGD) WITH PROPOFOL N/A 03/21/2019   Procedure: ESOPHAGOGASTRODUODENOSCOPY (EGD) WITH PROPOFOL;  Surgeon: Toledo, Benay Pike, MD;  Location: [facility];  Service: Gastroenterology;  Laterality: N/A;   esophogeal cancer     JOINT REPLACEMENT Right    total hip   LUMBAR LAMINECTOMY/DECOMPRESSION MICRODISCECTOMY N/A 03/03/2018   Procedure: LUMBAR LAMINECTOMY/DECOMPRESSION MICRODISCECTOMY 1 LEVEL-L3-4,L4-5;  Surgeon: Meade Maw, MD;  Location: [facility] OR[facility];  Service: Neurosurgery;  Laterality: N/A;   LUMBAR LAMINECTOMY/DECOMPRESSION MICRODISCECTOMY N/A 02/24/2020   Procedure: LEFT L3-4 MICRODISCECTOMY, L4-5 DECOMPRESSION;  Surgeon: Meade Maw, MD;  Location: [facility];  Service: Neurosurgery;  Laterality: N/A;   MAXIMUM ACCESS (MAS) TRANSFORAMINAL LUMBAR INTERBODY FUSION (TLIF) 1 LEVEL N/A 07/13/2020   Procedure: OPEN L3-4 TRANSFORAMINAL LUMBAR INTERBODY FUSION (TLIF);  Surgeon: Meade Maw, MD;  Location: [facility];  Service: Neurosurgery;  Laterality: N/A;   PARTIAL HIP ARTHROPLASTY Right 2014   PORT-A-CATH REMOVAL     PORTACATH PLACEMENT Left 01/12/2020   Procedure: INSERTION PORT-A-CATH, with ultrasound fluoroscopy;  Surgeon: Nestor Lewandowsky, MD;  Location: [facility] OR[facility];  Service: General;  Laterality: Left;   TONGUE BIOPSY  2006   TONSILLECTOMY     TRIGGER FINGER RELEASE Right 2013   UPPER GI ENDOSCOPY  01/25/14   multiple gastric polyps     Social History   Tobacco Use   Smoking status: Former    Packs/day: 1.00    Years: 30.00    Pack years: 30.00    Types: Cigarettes    Quit date: 02/18/1994    Years since quitting: 26.6   Smokeless tobacco: Never  Vaping Use   Vaping Use: Never used  Substance Use Topics    Alcohol use: Not Currently   Drug use: No      Family History  Problem Relation Age of Onset   Diabetes Mother    Congestive Heart Failure Mother    Breast cancer Mother    Lung cancer Father      Allergies  Allergen Reactions   Sulfa Antibiotics Rash      REVIEW OF SYSTEMS (Negative unless checked)   Constitutional: '[x]'$ Weight loss  '[]'$ Fever  '[]'$ Chills Cardiac: '[]'$ Chest pain   '[]'$ Chest pressure   '[]'$ Palpitations   '[]'$ Shortness of breath when laying flat   '[]'$ Shortness of breath at rest   '[]'$ Shortness of breath with exertion. Vascular:  '[]'$ Pain in legs with walking   '[]'$   Pain in legs at rest   '[]'$ Pain in legs when laying flat   '[]'$ Claudication   '[]'$ Pain in feet when walking  '[]'$ Pain in feet at rest  '[]'$ Pain in feet when laying flat   '[]'$ History of DVT   '[]'$ Phlebitis   '[]'$ Swelling in legs   '[]'$ Varicose veins   '[]'$ Non-healing ulcers Pulmonary:   '[]'$ Uses home oxygen   '[]'$ Productive cough   '[]'$ Hemoptysis   '[]'$ Wheeze  '[]'$ COPD   '[]'$ Asthma Neurologic:  '[]'$ Dizziness  '[]'$ Blackouts   '[]'$ Seizures   '[]'$ History of stroke   '[]'$ History of TIA  '[]'$ Aphasia   '[]'$ Temporary blindness   '[]'$ Dysphagia   '[]'$ Weakness or numbness in arms   '[]'$ Weakness or numbness in legs Musculoskeletal:  '[x]'$ Arthritis   '[]'$ Joint swelling   '[x]'$ Joint pain   '[]'$ Low back pain Hematologic:  '[]'$ Easy bruising  '[]'$ Easy bleeding   '[]'$ Hypercoagulable state   '[]'$ Anemic  '[]'$ Hepatitis Gastrointestinal:  '[]'$ Blood in stool   '[]'$ Vomiting blood  '[x]'$ Gastroesophageal reflux/heartburn   '[x]'$ Difficulty swallowing. Genitourinary:  '[]'$ Chronic kidney disease   '[]'$ Difficult urination  '[]'$ Frequent urination  '[]'$ Burning with urination   '[]'$ Blood in urine Skin:  '[]'$ Rashes   '[]'$ Ulcers   '[]'$ Wounds Psychological:  '[]'$ History of anxiety   '[]'$  History of major depression.  Physical Examination  Vitals:   10/23/20 1438  BP: 118/65  Pulse: 73  Resp: 16  Weight: 147 lb 6.4 oz (66.9 kg)   Body mass index is 24.53 kg/m. Gen:  WD/WN, NAD Head: [place]/AT, No temporalis wasting. Ear/Nose/Throat: Hearing grossly  intact, nares w/o erythema or drainage, trachea midline Eyes: Conjunctiva clear. Sclera non-icteric Neck: Supple.  Bilateral carotid bruit  Pulmonary:  Good air movement, equal and clear to auscultation bilaterally.  Cardiac: RRR, No JVD Vascular:  Vessel Right Left  Radial Palpable Palpable   Musculoskeletal: M/S 5/5 throughout.  No deformity or atrophy. No edema. Neurologic: CN 2-12 intact. Sensation grossly intact in extremities.  Symmetrical.  Speech is fluent. Motor exam as listed above. Psychiatric: Judgment intact, Mood & affect appropriate for pt's clinical situation. Dermatologic: No rashes or ulcers noted.  No cellulitis or open wounds. Lymph : No Cervical, Axillary, or Inguinal lymphadenopathy.    CBC Lab Results  Component Value Date   WBC 9.0 07/12/2020   HGB 13.0 07/12/2020   HCT 41.6 07/12/2020   MCV 86.5 07/12/2020   PLT 288 07/12/2020    BMET    Component Value Date/Time   NA 139 07/12/2020 1011   NA 130 (L) 01/26/2013 0505   K 4.3 07/12/2020 1011   K 4.6 01/26/2013 0505   CL 103 07/12/2020 1011   CL 101 01/26/2013 0505   CO2 27 07/12/2020 1011   CO2 26 01/26/2013 0505   GLUCOSE 140 (H) 07/12/2020 1011   GLUCOSE 138 (H) 01/26/2013 0505   BUN 16 07/12/2020 1011   BUN 19 (H) 01/26/2013 0505   CREATININE 0.92 07/12/2020 1011   CREATININE 1.18 01/26/2013 0505   CALCIUM 8.8 (L) 07/12/2020 1011   CALCIUM 8.3 (L) 01/26/2013 0505   GFRNONAA >60 07/12/2020 1011   GFRNONAA >60 01/26/2013 0505   GFRAA >60 12/28/2019 1328   GFRAA >60 01/26/2013 0505   CrCl cannot be calculated (Patient's most recent lab result is older than the maximum 21 days allowed.).  COAG Lab Results  Component Value Date   INR 1.0 07/04/2020   INR 1.0 02/17/2020   INR 1.0 01/10/2020    Radiology No results found.   Assessment/Plan HLD (hyperlipidemia) lipid control important in reducing the progression of atherosclerotic disease.  Continue statin therapy     Essential  (primary) hypertension blood pressure control important in reducing the progression of atherosclerotic disease. On appropriate oral medications.   Diabetes mellitus ([place]) blood glucose control important in reducing the progression of atherosclerotic disease. Also, involved in wound healing. On appropriate medications.   Malignant neoplasm of lower third of esophagus (HCC) S/p esophagectomy   Carotid stenosis  Carotid duplex reveals stable right carotid stenosis in the 60 to 79% range and stable carotid stenosis on the left and 60 to 79% range as well.  No role for surgery or intervention at this level but we will continue to monitor this on relatively close interval follow-up 6 months.   Leotis Pain, MD  10/23/2020 3:03 PM    This note was created with Dragon medical transcription system.  Any errors from dictation are purely unintentional

## 2020-10-24 ENCOUNTER — Other Ambulatory Visit: Payer: Self-pay

## 2020-10-24 MED ORDER — TAMSULOSIN HCL 0.4 MG PO CAPS
ORAL_CAPSULE | ORAL | 3 refills | Status: DC
  Filled 2020-10-24: qty 90, 90d supply, fill #0

## 2020-10-26 ENCOUNTER — Other Ambulatory Visit: Payer: Self-pay

## 2020-10-26 ENCOUNTER — Inpatient Hospital Stay: Payer: Medicare Other | Attending: Oncology

## 2020-10-26 DIAGNOSIS — R12 Heartburn: Secondary | ICD-10-CM | POA: Diagnosis not present

## 2020-10-26 DIAGNOSIS — C159 Malignant neoplasm of esophagus, unspecified: Secondary | ICD-10-CM

## 2020-10-26 DIAGNOSIS — Z452 Encounter for adjustment and management of vascular access device: Secondary | ICD-10-CM | POA: Insufficient documentation

## 2020-10-26 DIAGNOSIS — Z8501 Personal history of malignant neoplasm of esophagus: Secondary | ICD-10-CM | POA: Diagnosis present

## 2020-10-26 DIAGNOSIS — Z95828 Presence of other vascular implants and grafts: Secondary | ICD-10-CM

## 2020-10-26 LAB — COMPREHENSIVE METABOLIC PANEL
ALT: 13 U/L (ref 0–44)
AST: 19 U/L (ref 15–41)
Albumin: 3.4 g/dL — ABNORMAL LOW (ref 3.5–5.0)
Alkaline Phosphatase: 80 U/L (ref 38–126)
Anion gap: 8 (ref 5–15)
BUN: 18 mg/dL (ref 8–23)
CO2: 25 mmol/L (ref 22–32)
Calcium: 8.6 mg/dL — ABNORMAL LOW (ref 8.9–10.3)
Chloride: 105 mmol/L (ref 98–111)
Creatinine, Ser: 0.92 mg/dL (ref 0.61–1.24)
GFR, Estimated: >60 mL/min (ref >60)
Glucose, Bld: 145 mg/dL — ABNORMAL HIGH (ref 70–99)
Potassium: 4.1 mmol/L (ref 3.5–5.1)
Sodium: 138 mmol/L (ref 135–145)
Total Bilirubin: 0.2 mg/dL — ABNORMAL LOW (ref 0.3–1.2)
Total Protein: 6.2 g/dL — ABNORMAL LOW (ref 6.5–8.1)

## 2020-10-26 LAB — CBC WITH DIFFERENTIAL/PLATELET
Abs Immature Granulocytes: 0.08 10*3/uL — ABNORMAL HIGH (ref 0.00–0.07)
Basophils Absolute: 0.1 10*3/uL (ref 0.0–0.1)
Basophils Relative: 1 %
Eosinophils Absolute: 0.1 10*3/uL (ref 0.0–0.5)
Eosinophils Relative: 1 %
HCT: 37.9 % — ABNORMAL LOW (ref 39.0–52.0)
Hemoglobin: 11.4 g/dL — ABNORMAL LOW (ref 13.0–17.0)
Immature Granulocytes: 1 %
Lymphocytes Relative: 12 %
Lymphs Abs: 1 10*3/uL (ref 0.7–4.0)
MCH: 24.8 pg — ABNORMAL LOW (ref 26.0–34.0)
MCHC: 30.1 g/dL (ref 30.0–36.0)
MCV: 82.6 fL (ref 80.0–100.0)
Monocytes Absolute: 0.7 10*3/uL (ref 0.1–1.0)
Monocytes Relative: 8 %
Neutro Abs: 6.7 10*3/uL (ref 1.7–7.7)
Neutrophils Relative %: 77 %
Platelets: 284 10*3/uL (ref 150–400)
RBC: 4.59 MIL/uL (ref 4.22–5.81)
RDW: 14.6 % (ref 11.5–15.5)
WBC: 8.6 10*3/uL (ref 4.0–10.5)
nRBC: 0 % (ref 0.0–0.2)

## 2020-10-26 MED ORDER — HEPARIN SOD (PORK) LOCK FLUSH 100 UNIT/ML IV SOLN
500.0000 [IU] | Freq: Once | INTRAVENOUS | Status: AC
  Administered 2020-10-26: 500 [IU] via INTRAVENOUS
  Filled 2020-10-26: qty 5

## 2020-10-26 MED ORDER — SODIUM CHLORIDE 0.9% FLUSH
10.0000 mL | INTRAVENOUS | Status: DC | PRN
  Administered 2020-10-26: 10 mL via INTRAVENOUS
  Filled 2020-10-26: qty 10

## 2020-10-26 MED ORDER — HEPARIN SOD (PORK) LOCK FLUSH 100 UNIT/ML IV SOLN
INTRAVENOUS | Status: AC
  Filled 2020-10-26: qty 5

## 2020-10-27 LAB — CEA: CEA: 3.4 ng/mL (ref 0.0–4.7)

## 2020-10-29 ENCOUNTER — Other Ambulatory Visit: Payer: Self-pay

## 2020-10-29 ENCOUNTER — Encounter: Payer: Self-pay | Admitting: Oncology

## 2020-10-29 ENCOUNTER — Inpatient Hospital Stay: Payer: Medicare Other | Attending: Oncology | Admitting: Oncology

## 2020-10-29 VITALS — BP 168/66 | HR 67 | Temp 98.3°F | Resp 18 | Wt 150.1 lb

## 2020-10-29 DIAGNOSIS — Z95828 Presence of other vascular implants and grafts: Secondary | ICD-10-CM

## 2020-10-29 DIAGNOSIS — J90 Pleural effusion, not elsewhere classified: Secondary | ICD-10-CM | POA: Diagnosis not present

## 2020-10-29 DIAGNOSIS — Z8501 Personal history of malignant neoplasm of esophagus: Secondary | ICD-10-CM | POA: Insufficient documentation

## 2020-10-29 DIAGNOSIS — C159 Malignant neoplasm of esophagus, unspecified: Secondary | ICD-10-CM

## 2020-10-29 DIAGNOSIS — R12 Heartburn: Secondary | ICD-10-CM | POA: Diagnosis not present

## 2020-10-29 MED ORDER — PROMETHAZINE HCL 25 MG PO TABS
25.0000 mg | ORAL_TABLET | Freq: Four times a day (QID) | ORAL | 1 refills | Status: DC | PRN
  Filled 2020-10-29: qty 60, 15d supply, fill #0

## 2020-10-29 NOTE — Progress Notes (Signed)
Hematology/Oncology note [facility] [phone] [phone]   Patient Care Team: Idelle Crouch, MD as PCP - General (Internal Medicine) Christene Lye, MD (General Surgery) Clent Jacks, RN as Oncology Nurse Navigator  REFERRING PROVIDER: Idelle Crouch, MD  CHIEF COMPLAINTS/REASON FOR VISIT:  Follow up  esophageal cancer  HISTORY OF PRESENTING ILLNESS:   Blake [name] is a  75 y.o.  male with PMH listed below was seen in consultation at the request of  Idelle Crouch, MD  for evaluation of esophageal cancer Patient has longstanding Barrett's esophagus. 03/21/2019 patient underwent surveillance endoscopy which showed esophageal mucosal changes secondary to long segment Barrett's disease present in the lower third of the esophagus.  Maximal longitudinal extent of these mucosal changes were 7 cm in length.  Mucosa was biopsied with cold forceps for histology in 4 quadrants at the interval of 2 cm in the lower third of esophagus.  2 cm hiatal hernia, gastritis. 03/21/2019 colonoscopy showed diverticulosis in the sigmoid colon.  4 mm polyp in ascending colon was removed. 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  1 out of 4 esophagus biopsy showed Barrett's mucosa with erosion, indefinite for s. Colon polyp biopsy showed tubular adenoma. Patient was referred to cancer center for further evaluation and management.  04/06/2019 CT chest abdomen pelvis showed no evidence of metastatic disease in the chest abdomen or pelvis.  No esophageal mass lesion evident.  Small to moderate hiatal hernia with possible wall thickening proximal stomach.  3 mm left lower lobe pulmonary nodule attention on follow-up recommended to ensure stability.  Tiny hypodensities in the left kidney with attenuation too high to be simple cyst.  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 Jeb Levering.  His initial PET scan 11/08/2004 showed abnormal localization in a right neck mass possibly a lymph node, slightly asymmetric localization at the base of the tongue.  Pathology is not available in current EMR.   #06/09/19, EMR at [facility].  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 EMR specimen was received in multiple fragments and margins can not be evaluated.  1 gastric polyps were biopsied which showed fundic gland polyp. Negative for dysplasia  #08/08/19, to OR for EGD with sessile tumor from 29-33cm, at least 1.5 cm wide. Pathology with the following: 1) esophagus (30cm) - residual invasive adenocarcinoma, at least invasive to muscularis mucosa and 2) esophagus (30cm) - superficial fragments of highly dysplastic epithelium, focally suspicious for intramucosal carcinoma  #  09/12/2019, patient underwent esophagectomy with pathological revealed  Negative esophageal margin, gastric margin, esophagogastrectomy, invasive moderately differentiated esophageal adenocarcinoma involved distal esophageal and GE junction, tumor focally invades the submucosa.  Left gastric lymph node x 4 negative, greater curvature lymph nodes 1/3 is positive for 47m metastatic carcinoma.  Periesophageal lymph node x 6, negative, final pathology stage pT1b pn1, Esophageal adenocarcinoma, Stage IIB Lymph node positive diease, no neoadjuvant chemotherapy, recommended by  Dr. UFanny Skatesand was recommended for adjuvant chemotherapy.  Patient prefers to receive treatment closer to home and patient call back to establish care. No radiation was recommended due to the concern of dehiscence. 11/28/2019, patient had a stent removal.  Adjuvant chemotherapy therapy September 2021, patient underwent Xeloda 1500 mg twice daily/oxaliplatin.  He reports not tolerating well due to nausea  and diarrhea.  12/24/2019 patient's wife was diagnosed with COVID-19 infection and patient was tested negative.  Both patient and wife received monoclonal antibody treatments.  Patient prefers to switch to FOLFOX. He had Mediport placed on 01/12/2020 by Dr. Faith Rogue. Additional chemotherapy was interrupted and delayed due to his aspiration pneumonia, and later due to his preference of proceeding with back surgery first- see below. He had x-ray prior and after this procedure.  Post x-ray showed extensive patchy consolidation throughout the right lung and a mild patchy opacity in the upper left lung.  Worsened.  Patient also reports having cough, as well as some chronic shortness of breath ever since his surgery at [facility] in June 2021. 01/17/2020, stat CT chest with contrast showed extensive nodular airspace process in the right lung with loss of volume and areas of subpleural atelectasis.  Likely polylobar pneumonia with aspiration.  No mediastinal or hilar mass or adenopathy.  Surgical changes Patient was advised to go to emergency room and was later admitted for pneumonia treatments.  There was concern of aspiration pneumonia.  Patient was evaluated by speech therapy.  No overt clinical signs of aspiration during p.o. intake.  Cannot rule out possibility of esophageal phase retrograde flow activity patient denies further swallowing evaluation.  Patient was discharged on 01/19/2020. 02/10/2020 patient was seen by [facility] clinic gastroenterology.  Was recommended and no indication of repeat swallowing evaluation at present time. 02/24/2020, patient had L4-5 lumbar decompression, L3--4 microdiscectomy.  Per note he tolerates procedure very well. djuvant chemotherapy has been delayed significantly due to above reasons. At that point, the benefit is uncertain to resume adjuvant chemotherapy 9 months after original surgery. 04/10/2020, PET scan showed progressive right lung area of dense consolidation and clustering  nodularity with correlated hypermetabolic him.  Favor aspiration/chronic infection.  No signs of metastatic disease.  Decision was made to continue observation   Patient and his wife traveled in February 2022. Feb/March Covid pneumonia admission during his trip. 06/26/2020- 06/27/2020 admission due to chest pain. Toponin was negative, CXR negative.CT chest angiogram showed np PE, multifocal pneumonia through out right lung, improved. Increasing moderate left pleural effusion. Unchanged trace right pleural effusion. 07/12/2020 CT abdomen pelvis with contrast - no findings of active malignancy. masslike structure in the left upper quadrant represents a confluent region of fat necrosis. Not hypermetabolic on previous PET. Nodular and bandlike opacities at right lung base have improved comparing to 06/26/2020. Substantial narrowing of right lowe lobe tracheobronchial tree. Other chronic findings.   07/16/2020 s/p spondylolisthesis.    INTERVAL HISTORY Blake [name] is a 75 y.o. male who has above history reviewed by me today presents for follow up visit for management of esophageal cancer.  Problems and complaints are listed below: Patient reports feeling well.  Occasionally he has nausea for which he takes antiemetics-Phenergan listed on his medication list. Symptoms improved with treatment.  He continues to have chronic heartburn which he takes PPI.  Reports that symptom has improved.  Now he sleeps with 1 pillow instead of 2 pillows.  Breathing is at baseline.  Review of Systems  Constitutional:  Negative for appetite change, chills, diaphoresis, fatigue, fever and unexpected weight change.  HENT:   Negative for hearing loss, lump/mass, nosebleeds, sore throat and voice change.   Eyes:  Negative for eye problems and icterus.  Respiratory:  Negative for chest tightness, cough, hemoptysis, shortness of breath and wheezing.   Cardiovascular:  Negative for chest pain and leg swelling.   Gastrointestinal:  Negative for abdominal distention,  abdominal pain, blood in stool, diarrhea, nausea and rectal pain.       Heartburn  Endocrine: Negative for hot flashes.  Genitourinary:  Negative for bladder incontinence, difficulty urinating, dysuria, frequency, hematuria and nocturia.   Musculoskeletal:  Negative for arthralgias, back pain, flank pain, gait problem and myalgias.  Skin:  Negative for itching and rash.  Neurological:  Negative for dizziness, gait problem, headaches, light-headedness, numbness and seizures.  Hematological:  Negative for adenopathy. Does not bruise/bleed easily.  Psychiatric/Behavioral:  Negative for confusion and decreased concentration. The patient is not nervous/anxious.    MEDICAL HISTORY:  Past Medical History:  Diagnosis Date   Arthritis    Back pain    with leg pain   Basal cell carcinoma 09/04/2009   R cheek 5.5 cm ant to earlobe - 12/26/2009 excision   Basal cell carcinoma 09/04/2009   R ant nasal alar rim   Basal cell carcinoma 07/31/2014   R mid brow   Basal cell carcinoma 07/30/2016   R ant nasal alar rim at ant edge of BCC scar   Basal cell carcinoma 11/02/2019   L zygoma    Cancer of base of tongue ([place]) 2006   s/p chemoradiation   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, bilateral    Dyspnea    Dysrhythmia    Esophageal cancer ([place]) 2021   GERD (gastroesophageal reflux disease)    Hyperlipidemia    Hypertension    Numbness and tingling of both lower extremities    with positioning   Paroxysmal atrial fibrillation ([place])    Port-A-Cath in place 2006   Prostate enlargement    Squamous cell carcinoma of skin 12/14/2019   L pretibial - ED&C    T2DM (type 2 diabetes mellitus) ([place])     SURGICAL HISTORY: Past Surgical History:  Procedure Laterality Date   ABDOMINAL SURGERY     COLONOSCOPY  12/08/2003   COLONOSCOPY WITH PROPOFOL N/A 03/21/2019   Procedure: COLONOSCOPY WITH PROPOFOL;  Surgeon: Toledo, Benay Pike,  MD;  Location: [facility] END[facility];  Service: Gastroenterology;  Laterality: N/A;   ESOPHAGOGASTRODUODENOSCOPY (EGD) WITH PROPOFOL N/A 03/21/2019   Procedure: ESOPHAGOGASTRODUODENOSCOPY (EGD) WITH PROPOFOL;  Surgeon: Toledo, Benay Pike, MD;  Location: [facility] ENDOSCOPY;  Service: Gastroenterology;  Laterality: N/A;   esophogeal cancer     JOINT REPLACEMENT Right    total hip   LUMBAR LAMINECTOMY/DECOMPRESSION MICRODISCECTOMY N/A 03/03/2018   Procedure: LUMBAR LAMINECTOMY/DECOMPRESSION MICRODISCECTOMY 1 LEVEL-L3-4,L4-5;  Surgeon: Meade Maw, MD;  Location: [facility] ORS;  Service: Neurosurgery;  Laterality: N/A;   LUMBAR LAMINECTOMY/DECOMPRESSION MICRODISCECTOMY N/A 02/24/2020   Procedure: LEFT L3-4 MICRODISCECTOMY, L4-5 DECOMPRESSION;  Surgeon: Meade Maw, MD;  Location: [facility] ORS;  Service: Neurosurgery;  Laterality: N/A;   MAXIMUM ACCESS (MAS) TRANSFORAMINAL LUMBAR INTERBODY FUSION (TLIF) 1 LEVEL N/A 07/13/2020   Procedure: OPEN L3-4 TRANSFORAMINAL LUMBAR INTERBODY FUSION (TLIF);  Surgeon: Meade Maw, MD;  Location: [facility] ORS;  Service: Neurosurgery;  Laterality: N/A;   PARTIAL HIP ARTHROPLASTY Right 2014   PORT-A-CATH REMOVAL     PORTACATH PLACEMENT Left 01/12/2020   Procedure: INSERTION PORT-A-CATH, with ultrasound fluoroscopy;  Surgeon: Nestor Lewandowsky, MD;  Location: [facility] ORS;  Service: General;  Laterality: Left;   TONGUE BIOPSY  2006   TONSILLECTOMY     TRIGGER FINGER RELEASE Right 2013   UPPER GI ENDOSCOPY  01/25/14   multiple gastric polyps    SOCIAL HISTORY: Social History   Socioeconomic History   Marital status: Married    Spouse name: Not on file  Number of children: Not on file   Years of education: Not on file   Highest education level: Not on file  Occupational History   Not on file  Tobacco Use   Smoking status: Former    Packs/day: 1.00    Years: 30.00    Pack years: 30.00    Types: Cigarettes    Quit date: 02/18/1994    Years since quitting: 26.7    Smokeless tobacco: Never  Vaping Use   Vaping Use: Never used  Substance and Sexual Activity   Alcohol use: Not Currently   Drug use: No   Sexual activity: Yes  Other Topics Concern   Not on file  Social History Narrative   Not on file   Social Determinants of Health   Financial Resource Strain: Not on file  Food Insecurity: Not on file  Transportation Needs: Not on file  Physical Activity: Not on file  Stress: Not on file  Social Connections: Not on file  Intimate Partner Violence: Not on file    FAMILY HISTORY: Family History  Problem Relation Age of Onset   Diabetes Mother    Congestive Heart Failure Mother    Breast cancer Mother    Lung cancer Father     ALLERGIES:  is allergic to sulfa antibiotics.  MEDICATIONS:  Current Outpatient Medications  Medication Sig Dispense Refill   amiodarone (PACERONE) 200 MG tablet TAKE 1 TABLET BY MOUTH ONCE DAILY 90 tablet 1   metoprolol succinate (TOPROL-XL) 100 MG 24 hr tablet Take 100 mg by mouth daily.     metoprolol succinate (TOPROL-XL) 100 MG 24 hr tablet Take one tablet by mouth once daily 90 tablet 1   mirtazapine (REMERON) 15 MG tablet Take 1 tablet (15 mg total) by mouth nightly 30 tablet 11   acetaminophen (TYLENOL) 325 MG tablet Take 2 tablets (650 mg total) by mouth every 4 (four) hours as needed for mild pain ((score 1 to 3) or temp > 100.5). (Patient not taking: Reported on 10/29/2020) 60 tablet 0   atorvastatin (LIPITOR) 80 MG tablet TAKE 1 TABLET BY MOUTH ONCE DAILY (Patient not taking: Reported on 10/29/2020) 90 tablet 3   fludrocortisone (FLORINEF) 0.1 MG tablet TAKE 1 TABLET BY MOUTH ONCE DAILY (Patient not taking: Reported on 10/29/2020) 90 tablet 1   furosemide (LASIX) 20 MG tablet TAKE 1 TABLET (20 MG TOTAL) BY MOUTH DAILY. (Patient not taking: Reported on 10/29/2020) 30 tablet 2   gabapentin (NEURONTIN) 300 MG capsule TAKE 1 CAPSULE (300 MG TOTAL) BY MOUTH 3 (THREE) TIMES DAILY (Patient not taking: Reported on  10/29/2020) 90 capsule 11   hydrocortisone 2.5 % lotion For seborrheic dermatitis. Apply to affected area on face every Monday, Wednesday, and Friday. (Patient not taking: Reported on 10/29/2020) 59 mL 3   lidocaine-prilocaine (EMLA) cream APPLY TO AFFECTED AREA ONCE AS DIRECTED (Patient not taking: Reported on 10/29/2020) 30 g 3   lisinopril (ZESTRIL) 10 MG tablet TAKE 1 TABLET (10 MG TOTAL) BY MOUTH DAILY. (Patient not taking: Reported on 10/29/2020) 30 tablet 3   metFORMIN (GLUCOPHAGE) 500 MG tablet TAKE 1 TABLET BY MOUTH TWICE DAILY WITH MEALS 60 tablet 11   methocarbamol (ROBAXIN) 500 MG tablet Take 1 and 1/2 tablets (750 mg total) by mouth every 6 (six) hours as needed for muscle spasms. (Patient not taking: Reported on 10/29/2020) 90 tablet 1   omeprazole (PRILOSEC) 40 MG capsule Take 40 mg by mouth 2 (two) times daily. (Patient not taking: Reported on 10/29/2020)  3   omeprazole (PRILOSEC) 40 MG capsule take one capsule by mouth once daily (Patient not taking: Reported on 10/29/2020) 90 capsule 3   oxyCODONE (OXY IR/ROXICODONE) 5 MG immediate release tablet Take 1-2 tablets (5-10 mg total) by mouth every 4 (four) hours as needed for moderate pain (5 mg for pain 4-6/10, 10 mg for 7-10/10). (Patient not taking: Reported on 10/29/2020) 60 tablet 0   promethazine (PHENERGAN) 25 MG tablet Take 1 tablet (25 mg total) by mouth every 6 (six) hours as needed. 60 tablet 1   senna-docusate (SENOKOT-S) 8.6-50 MG tablet Take 1 tablet by mouth 2 (two) times daily. (Patient not taking: Reported on 10/29/2020) 60 tablet 0   tamsulosin (FLOMAX) 0.4 MG CAPS capsule Take 0.4 mg by mouth daily after supper.  (Patient not taking: Reported on 10/29/2020)     tamsulosin (FLOMAX) 0.4 MG CAPS capsule TAKE 1 CAPSULE BY MOUTH ONCE DAILY. TAKE 30 MINUTES AFTER SAME MEAL EACH DAY. (Patient not taking: Reported on 10/29/2020) 90 capsule 3   tamsulosin (FLOMAX) 0.4 MG CAPS capsule Take 1 capsule (0.4 mg total) by mouth once daily. TAKE 30 MINUTES  AFTER SAME MEAL EACH DAY (Patient not taking: Reported on 10/29/2020) 90 capsule 3   No current facility-administered medications for this visit.     PHYSICAL EXAMINATION: ECOG PERFORMANCE STATUS: 1 - Symptomatic but completely ambulatory Vitals:   10/29/20 1320  BP: (!) 168/66  Pulse: 67  Resp: 18  Temp: 98.3 F (36.8 C)  SpO2: 97%   Filed Weights   10/29/20 1320  Weight: 150 lb 1.6 oz (68.1 kg)    Physical Exam Constitutional:      General: He is not in acute distress. HENT:     Head: Normocephalic and atraumatic.  Eyes:     General: No scleral icterus.    Pupils: Pupils are equal, round, and reactive to light.  Cardiovascular:     Rate and Rhythm: Normal rate and regular rhythm.     Heart sounds: Normal heart sounds.  Pulmonary:     Effort: Pulmonary effort is normal. No respiratory distress.     Breath sounds: No wheezing.  Abdominal:     General: Bowel sounds are normal. There is no distension.     Palpations: Abdomen is soft. There is no mass.     Tenderness: There is no abdominal tenderness.  Musculoskeletal:        General: No deformity. Normal range of motion.     Cervical back: Normal range of motion and neck supple.  Skin:    General: Skin is warm and dry.     Findings: No erythema or rash.  Neurological:     Mental Status: He is alert and oriented to person, place, and time. Mental status is at baseline.     Cranial Nerves: No cranial nerve deficit.     Coordination: Coordination normal.  Psychiatric:        Mood and Affect: Mood normal.        Behavior: Behavior normal.        Thought Content: Thought content normal.    LABORATORY DATA:  I have reviewed the data as listed Lab Results  Component Value Date   WBC 8.6 10/26/2020   HGB 11.4 (L) 10/26/2020   HCT 37.9 (L) 10/26/2020   MCV 82.6 10/26/2020   PLT 284 10/26/2020   Recent Labs    12/19/19 0906 12/28/19 1328 01/17/20 0840 06/26/20 0856 06/27/20 0505 07/12/20 1011 10/26/20 1407  NA 139 137 135   < > 139 139 138  K 4.4 4.1 4.4   < > 3.9 4.3 4.1  CL 103 102 100   < > 104 103 105  CO2 '26 24 24   '$ < > '28 27 25  '$ GLUCOSE 227* 157* 175*   < > 114* 140* 145*  BUN '20 15 20   '$ < > '12 16 18  '$ CREATININE 1.05 1.07 0.79   < > 0.95 0.92 0.92  CALCIUM 8.8* 8.6* 8.6*   < > 8.8* 8.8* 8.6*  GFRNONAA >60 >60 >60   < > >60 >60 >60  GFRAA >60 >60  --   --   --   --   --   PROT 6.6 6.8 7.1  --   --  6.5 6.2*  ALBUMIN 3.6 3.4* 2.9*  --   --  3.6 3.4*  AST '17 18 23  '$ --   --  17 19  ALT '15 16 24  '$ --   --  15 13  ALKPHOS 64 56 72  --   --  56 80  BILITOT 0.6 0.5 0.5  --   --  0.4 0.2*   < > = values in this interval not displayed.    Iron/TIBC/Ferritin/ %Sat No results found for: IRON, TIBC, FERRITIN, IRONPCTSAT    RADIOGRAPHIC STUDIES: I have personally reviewed the radiological images as listed and agreed with the findings in the report. VAS US CAROTID  Result Date: 10/23/2020 Carotid Arterial Duplex Study Patient Name:  Blake [name]  Date of Exam:   10/23/2020 Medical Rec #: [id number]       Accession #:    [id number] Date of Birth: 1945-09-09      Patient Gender: M Patient Age:   61Y Exam Location:  [facility] Procedure:      VAS US CAROTID Referring Phys: [id number] [place] --------------------------------------------------------------------------------  Indications: Carotid artery disease. Performing Technologist: Almira Coaster RVS  Examination Guidelines: A complete evaluation includes B-mode imaging, spectral Doppler, color Doppler, and power Doppler as needed of all accessible portions of each vessel. Bilateral testing is considered an integral part of a complete examination. Limited examinations for reoccurring indications may be performed as noted.  Right Carotid Findings: +----------+--------+--------+--------+------------------+--------+           PSV cm/sEDV cm/sStenosisPlaque DescriptionComments  +----------+--------+--------+--------+------------------+--------+ CCA Prox  116     15                                         +----------+--------+--------+--------+------------------+--------+ CCA Mid   109     18                                         +----------+--------+--------+--------+------------------+--------+ CCA Distal102     21                                         +----------+--------+--------+--------+------------------+--------+ ICA Prox  112     34                                         +----------+--------+--------+--------+------------------+--------+  ICA Mid   227     66                                         +----------+--------+--------+--------+------------------+--------+ ICA Distal150     37                                         +----------+--------+--------+--------+------------------+--------+ ECA       152     0                                          +----------+--------+--------+--------+------------------+--------+ +----------+--------+-------+--------+-------------------+           PSV cm/sEDV cmsDescribeArm Pressure (mmHG) +----------+--------+-------+--------+-------------------+ Subclavian129     0                                  +----------+--------+-------+--------+-------------------+ +---------+--------+--+--------+--+ VertebralPSV cm/s55EDV cm/s11 +---------+--------+--+--------+--+  Left Carotid Findings: +----------+--------+--------+--------+------------------+--------+           PSV cm/sEDV cm/sStenosisPlaque DescriptionComments +----------+--------+--------+--------+------------------+--------+ CCA Prox  114     17                                         +----------+--------+--------+--------+------------------+--------+ CCA Mid   125     19                                         +----------+--------+--------+--------+------------------+--------+ CCA Distal132     27                                          +----------+--------+--------+--------+------------------+--------+ ICA Prox  191     37                                         +----------+--------+--------+--------+------------------+--------+ ICA Mid   258     63                                         +----------+--------+--------+--------+------------------+--------+ ICA Distal115     26                                         +----------+--------+--------+--------+------------------+--------+ ECA       151     0                                          +----------+--------+--------+--------+------------------+--------+ +----------+--------+--------+--------+-------------------+  PSV cm/sEDV cm/sDescribeArm Pressure (mmHG) +----------+--------+--------+--------+-------------------+ [id number]     0                                   +----------+--------+--------+--------+-------------------+ +---------+--------+--+--------+-+ VertebralPSV cm/s42EDV cm/s7 +---------+--------+--+--------+-+   Summary: Right Carotid: Velocities in the right ICA are consistent with a 60-79%                stenosis. Left Carotid: Velocities in the left ICA are consistent with a 60-79% stenosis. Vertebrals:  Bilateral vertebral arteries demonstrate antegrade flow. Subclavians: Normal flow hemodynamics were seen in bilateral subclavian              arteries. *See table(s) above for measurements and observations.  Electronically signed by Leotis Pain MD on 10/23/2020 at 5:00:27 PM.    Final         ASSESSMENT & PLAN:  1. 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[place])   2. Port-A-Cath in place   3. Heartburn   Cancer Staging Malignant neoplasm of lower third of esophagus (HCC) Staging form: Esophagus - Adenocarcinoma, AJCC 8th Edition - Clinical: No stage assigned - Unsigned - Pathologic stage from 12/09/2019: Stage IIB (pT1b, pN1, cM0, G2) - Signed by Earlie Server, MD on  12/09/2019   #Esophageal adenocarcinoma, Stage IIB Patient is status post esophagectomy, adjuvant chemotherapy includes 1 cycle of Xeloda/oxaliplatin.  Additional chemotherapy was not completed due to multiple hospitalization due to infection and back surgery. Labs reviewed and discussed with patient. Recommend patient to obtain CT scan for surveillance.  He is going to be on a trip with his wife to visit her son and a stay in [place] for 1 months.  We will arrange a CT scan to be done after his trip.  #Port-A-Cath in place, continue port flush every 8 weeks #Heartburn, continue PPI.  Occasional nausea, I reviewed the phenergan prescription. All questions were answered. The patient knows to call the clinic with any problems questions or concerns.  cc Idelle Crouch, MD  Follow-up in 3 months. we spent sufficient time to discuss many aspect of care, questions were answered to patient's satisfaction.   Earlie Server, MD, PhD Hematology Oncology [facility] Pager- [id number] 10/29/2020

## 2020-12-03 ENCOUNTER — Other Ambulatory Visit: Payer: Self-pay

## 2020-12-03 MED ORDER — OMEPRAZOLE 40 MG PO CPDR
DELAYED_RELEASE_CAPSULE | ORAL | 3 refills | Status: DC
  Filled 2020-12-03: qty 90, 90d supply, fill #0
  Filled 2021-02-18: qty 90, 90d supply, fill #1
  Filled 2021-06-07: qty 90, 90d supply, fill #2

## 2020-12-03 MED ORDER — AMIODARONE HCL 200 MG PO TABS
ORAL_TABLET | Freq: Every day | ORAL | 1 refills | Status: DC
  Filled 2020-12-03: qty 90, 90d supply, fill #0

## 2020-12-03 MED ORDER — METOPROLOL SUCCINATE ER 100 MG PO TB24
ORAL_TABLET | ORAL | 1 refills | Status: DC
  Filled 2020-12-03: qty 90, 90d supply, fill #0

## 2020-12-04 ENCOUNTER — Ambulatory Visit
Admission: RE | Admit: 2020-12-04 | Discharge: 2020-12-04 | Disposition: A | Discharge status: Home / Self Care | Payer: Medicare Other | Source: Ambulatory Visit | Attending: Oncology | Admitting: Oncology

## 2020-12-04 ENCOUNTER — Other Ambulatory Visit: Payer: Self-pay

## 2020-12-04 DIAGNOSIS — C159 Malignant neoplasm of esophagus, unspecified: Secondary | ICD-10-CM | POA: Diagnosis present

## 2020-12-04 LAB — POCT I-STAT CREATININE: Creatinine, Ser: 0.9 mg/dL (ref 0.61–1.24)

## 2020-12-04 MED ORDER — IOHEXOL 350 MG/ML SOLN
85.0000 mL | Freq: Once | INTRAVENOUS | Status: AC | PRN
  Administered 2020-12-04: 85 mL via INTRAVENOUS

## 2020-12-04 MED ORDER — AMIODARONE HCL 200 MG PO TABS
ORAL_TABLET | ORAL | 1 refills | Status: DC
  Filled 2020-12-04: qty 90, 90d supply, fill #0

## 2020-12-10 ENCOUNTER — Other Ambulatory Visit: Payer: Self-pay

## 2020-12-21 ENCOUNTER — Inpatient Hospital Stay: Payer: Medicare Other | Attending: Oncology

## 2020-12-21 ENCOUNTER — Other Ambulatory Visit: Payer: Self-pay

## 2020-12-21 DIAGNOSIS — C159 Malignant neoplasm of esophagus, unspecified: Secondary | ICD-10-CM | POA: Insufficient documentation

## 2020-12-21 DIAGNOSIS — Z452 Encounter for adjustment and management of vascular access device: Secondary | ICD-10-CM | POA: Diagnosis present

## 2020-12-21 DIAGNOSIS — Z95828 Presence of other vascular implants and grafts: Secondary | ICD-10-CM

## 2020-12-21 MED ORDER — SODIUM CHLORIDE 0.9% FLUSH
10.0000 mL | Freq: Once | INTRAVENOUS | Status: AC
  Administered 2020-12-21: 10 mL via INTRAVENOUS
  Filled 2020-12-21: qty 10

## 2020-12-21 MED ORDER — HEPARIN SOD (PORK) LOCK FLUSH 100 UNIT/ML IV SOLN
500.0000 [IU] | Freq: Once | INTRAVENOUS | Status: AC
  Administered 2020-12-21: 500 [IU] via INTRAVENOUS
  Filled 2020-12-21: qty 5

## 2021-01-09 ENCOUNTER — Other Ambulatory Visit: Payer: Self-pay

## 2021-01-14 ENCOUNTER — Other Ambulatory Visit: Payer: Self-pay

## 2021-01-14 MED ORDER — METOPROLOL SUCCINATE ER 100 MG PO TB24
ORAL_TABLET | ORAL | 1 refills | Status: DC
  Filled 2021-01-14 – 2021-02-18 (×2): qty 135, 90d supply, fill #0
  Filled 2021-05-10: qty 135, 90d supply, fill #1

## 2021-01-25 ENCOUNTER — Inpatient Hospital Stay: Payer: Medicare Other | Attending: Oncology

## 2021-01-25 ENCOUNTER — Other Ambulatory Visit: Payer: Self-pay

## 2021-01-25 DIAGNOSIS — Z452 Encounter for adjustment and management of vascular access device: Secondary | ICD-10-CM | POA: Diagnosis not present

## 2021-01-25 DIAGNOSIS — K5730 Diverticulosis of large intestine without perforation or abscess without bleeding: Secondary | ICD-10-CM | POA: Insufficient documentation

## 2021-01-25 DIAGNOSIS — C159 Malignant neoplasm of esophagus, unspecified: Secondary | ICD-10-CM

## 2021-01-25 DIAGNOSIS — Z85810 Personal history of malignant neoplasm of tongue: Secondary | ICD-10-CM | POA: Diagnosis not present

## 2021-01-25 DIAGNOSIS — Z8501 Personal history of malignant neoplasm of esophagus: Secondary | ICD-10-CM | POA: Diagnosis not present

## 2021-01-25 DIAGNOSIS — Z95828 Presence of other vascular implants and grafts: Secondary | ICD-10-CM

## 2021-01-25 LAB — CBC WITH DIFFERENTIAL/PLATELET
Abs Immature Granulocytes: 0.01 10*3/uL (ref 0.00–0.07)
Basophils Absolute: 0.1 10*3/uL (ref 0.0–0.1)
Basophils Relative: 1 %
Eosinophils Absolute: 0.2 10*3/uL (ref 0.0–0.5)
Eosinophils Relative: 4 %
HCT: 40 % (ref 39.0–52.0)
Hemoglobin: 12 g/dL — ABNORMAL LOW (ref 13.0–17.0)
Immature Granulocytes: 0 %
Lymphocytes Relative: 8 %
Lymphs Abs: 0.5 10*3/uL — ABNORMAL LOW (ref 0.7–4.0)
MCH: 24.4 pg — ABNORMAL LOW (ref 26.0–34.0)
MCHC: 30 g/dL (ref 30.0–36.0)
MCV: 81.3 fL (ref 80.0–100.0)
Monocytes Absolute: 0.3 10*3/uL (ref 0.1–1.0)
Monocytes Relative: 6 %
Neutro Abs: 4.7 10*3/uL (ref 1.7–7.7)
Neutrophils Relative %: 81 %
Platelets: 265 10*3/uL (ref 150–400)
RBC: 4.92 MIL/uL (ref 4.22–5.81)
RDW: 15.7 % — ABNORMAL HIGH (ref 11.5–15.5)
WBC: 5.8 10*3/uL (ref 4.0–10.5)
nRBC: 0 % (ref 0.0–0.2)

## 2021-01-25 LAB — COMPREHENSIVE METABOLIC PANEL
ALT: 21 U/L (ref 0–44)
AST: 22 U/L (ref 15–41)
Albumin: 3.4 g/dL — ABNORMAL LOW (ref 3.5–5.0)
Alkaline Phosphatase: 87 U/L (ref 38–126)
Anion gap: 6 (ref 5–15)
BUN: 16 mg/dL (ref 8–23)
CO2: 27 mmol/L (ref 22–32)
Calcium: 8.1 mg/dL — ABNORMAL LOW (ref 8.9–10.3)
Chloride: 104 mmol/L (ref 98–111)
Creatinine, Ser: 1 mg/dL (ref 0.61–1.24)
GFR, Estimated: >60 mL/min (ref >60)
Glucose, Bld: 233 mg/dL — ABNORMAL HIGH (ref 70–99)
Potassium: 4.2 mmol/L (ref 3.5–5.1)
Sodium: 137 mmol/L (ref 135–145)
Total Bilirubin: 0.6 mg/dL (ref 0.3–1.2)
Total Protein: 6.3 g/dL — ABNORMAL LOW (ref 6.5–8.1)

## 2021-01-25 MED ORDER — HEPARIN SOD (PORK) LOCK FLUSH 100 UNIT/ML IV SOLN
500.0000 [IU] | Freq: Once | INTRAVENOUS | Status: AC
  Filled 2021-01-25: qty 5

## 2021-01-25 MED ORDER — HEPARIN SOD (PORK) LOCK FLUSH 100 UNIT/ML IV SOLN
INTRAVENOUS | Status: AC
  Administered 2021-01-25: 500 [IU] via INTRAVENOUS
  Filled 2021-01-25: qty 5

## 2021-01-25 MED ORDER — SODIUM CHLORIDE 0.9% FLUSH
10.0000 mL | INTRAVENOUS | Status: DC | PRN
  Administered 2021-01-25: 10 mL via INTRAVENOUS
  Filled 2021-01-25: qty 10

## 2021-01-25 NOTE — Progress Notes (Signed)
Survivorship Care Plan visit completed.  Treatment summary reviewed and given to patient.  ASCO answers booklet reviewed and given to patient.  CARE program and Cancer Transitions discussed with patient along with other resources cancer center offers to patients and caregivers.  Patient verbalized understanding.    

## 2021-01-26 LAB — CEA: CEA: 3.6 ng/mL (ref 0.0–4.7)

## 2021-01-28 ENCOUNTER — Inpatient Hospital Stay (HOSPITAL_BASED_OUTPATIENT_CLINIC_OR_DEPARTMENT_OTHER): Payer: Medicare Other | Admitting: Oncology

## 2021-01-28 ENCOUNTER — Other Ambulatory Visit: Payer: Self-pay

## 2021-01-28 ENCOUNTER — Encounter: Payer: Self-pay | Admitting: Oncology

## 2021-01-28 VITALS — BP 159/60 | HR 66 | Temp 98.4°F | Resp 18 | Wt 145.9 lb

## 2021-01-28 DIAGNOSIS — Z8501 Personal history of malignant neoplasm of esophagus: Secondary | ICD-10-CM | POA: Diagnosis not present

## 2021-01-28 DIAGNOSIS — Z95828 Presence of other vascular implants and grafts: Secondary | ICD-10-CM | POA: Diagnosis not present

## 2021-01-28 DIAGNOSIS — C159 Malignant neoplasm of esophagus, unspecified: Secondary | ICD-10-CM

## 2021-01-28 NOTE — Progress Notes (Signed)
Pt here for follow up. No new concerns voiced.   
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
## 2021-02-04 ENCOUNTER — Other Ambulatory Visit: Payer: Self-pay

## 2021-02-12 ENCOUNTER — Other Ambulatory Visit: Payer: Self-pay

## 2021-02-15 ENCOUNTER — Other Ambulatory Visit: Payer: Self-pay

## 2021-02-15 ENCOUNTER — Inpatient Hospital Stay: Payer: Medicare Other | Attending: Oncology

## 2021-02-15 DIAGNOSIS — C159 Malignant neoplasm of esophagus, unspecified: Secondary | ICD-10-CM | POA: Diagnosis present

## 2021-02-15 DIAGNOSIS — Z452 Encounter for adjustment and management of vascular access device: Secondary | ICD-10-CM | POA: Insufficient documentation

## 2021-02-15 DIAGNOSIS — Z95828 Presence of other vascular implants and grafts: Secondary | ICD-10-CM

## 2021-02-15 MED ORDER — SODIUM CHLORIDE 0.9% FLUSH
10.0000 mL | Freq: Once | INTRAVENOUS | Status: AC
  Administered 2021-02-15: 10 mL via INTRAVENOUS
  Filled 2021-02-15: qty 10

## 2021-02-15 MED ORDER — HEPARIN SOD (PORK) LOCK FLUSH 100 UNIT/ML IV SOLN
500.0000 [IU] | Freq: Once | INTRAVENOUS | Status: AC
  Administered 2021-02-15: 500 [IU] via INTRAVENOUS
  Filled 2021-02-15: qty 5

## 2021-02-18 ENCOUNTER — Other Ambulatory Visit: Payer: Self-pay

## 2021-02-18 MED FILL — Lisinopril Tab 10 MG: ORAL | 30 days supply | Qty: 30 | Fill #1 | Status: AC

## 2021-02-26 DIAGNOSIS — I480 Paroxysmal atrial fibrillation: Secondary | ICD-10-CM | POA: Insufficient documentation

## 2021-03-11 ENCOUNTER — Other Ambulatory Visit: Payer: Self-pay

## 2021-03-26 ENCOUNTER — Inpatient Hospital Stay: Payer: Medicare Other | Attending: Oncology

## 2021-03-26 ENCOUNTER — Other Ambulatory Visit: Payer: Self-pay

## 2021-03-26 DIAGNOSIS — Z95828 Presence of other vascular implants and grafts: Secondary | ICD-10-CM

## 2021-03-26 DIAGNOSIS — C155 Malignant neoplasm of lower third of esophagus: Secondary | ICD-10-CM | POA: Diagnosis present

## 2021-03-26 DIAGNOSIS — Z452 Encounter for adjustment and management of vascular access device: Secondary | ICD-10-CM | POA: Insufficient documentation

## 2021-03-26 MED ORDER — HEPARIN SOD (PORK) LOCK FLUSH 100 UNIT/ML IV SOLN
500.0000 [IU] | Freq: Once | INTRAVENOUS | Status: AC
  Administered 2021-03-26: 14:00:00 500 [IU] via INTRAVENOUS
  Filled 2021-03-26: qty 5

## 2021-03-26 MED ORDER — SODIUM CHLORIDE 0.9% FLUSH
10.0000 mL | Freq: Once | INTRAVENOUS | Status: AC
  Administered 2021-03-26: 14:00:00 10 mL via INTRAVENOUS
  Filled 2021-03-26: qty 10

## 2021-04-06 MED FILL — Lisinopril Tab 10 MG: ORAL | 30 days supply | Qty: 30 | Fill #2 | Status: AC

## 2021-04-08 ENCOUNTER — Other Ambulatory Visit: Payer: Self-pay

## 2021-04-08 ENCOUNTER — Encounter: Payer: Self-pay | Admitting: Oncology

## 2021-04-09 DIAGNOSIS — M4326 Fusion of spine, lumbar region: Secondary | ICD-10-CM | POA: Diagnosis not present

## 2021-04-09 DIAGNOSIS — Z981 Arthrodesis status: Secondary | ICD-10-CM | POA: Diagnosis not present

## 2021-04-11 ENCOUNTER — Other Ambulatory Visit: Payer: Self-pay

## 2021-04-16 DIAGNOSIS — M545 Low back pain, unspecified: Secondary | ICD-10-CM | POA: Diagnosis not present

## 2021-04-17 DIAGNOSIS — E785 Hyperlipidemia, unspecified: Secondary | ICD-10-CM | POA: Diagnosis not present

## 2021-04-17 DIAGNOSIS — I10 Essential (primary) hypertension: Secondary | ICD-10-CM | POA: Diagnosis not present

## 2021-04-17 DIAGNOSIS — E119 Type 2 diabetes mellitus without complications: Secondary | ICD-10-CM | POA: Diagnosis not present

## 2021-04-17 DIAGNOSIS — I4891 Unspecified atrial fibrillation: Secondary | ICD-10-CM | POA: Diagnosis not present

## 2021-04-17 DIAGNOSIS — Z0000 Encounter for general adult medical examination without abnormal findings: Secondary | ICD-10-CM | POA: Diagnosis not present

## 2021-04-17 DIAGNOSIS — Z79899 Other long term (current) drug therapy: Secondary | ICD-10-CM | POA: Diagnosis not present

## 2021-04-23 ENCOUNTER — Encounter: Payer: Self-pay | Admitting: Oncology

## 2021-04-29 ENCOUNTER — Encounter: Payer: Self-pay | Admitting: Oncology

## 2021-04-30 ENCOUNTER — Encounter (INDEPENDENT_AMBULATORY_CARE_PROVIDER_SITE_OTHER): Payer: Self-pay | Admitting: Vascular Surgery

## 2021-04-30 ENCOUNTER — Ambulatory Visit (INDEPENDENT_AMBULATORY_CARE_PROVIDER_SITE_OTHER): Payer: Medicare HMO

## 2021-04-30 ENCOUNTER — Ambulatory Visit (INDEPENDENT_AMBULATORY_CARE_PROVIDER_SITE_OTHER): Payer: Medicare HMO | Admitting: Vascular Surgery

## 2021-04-30 ENCOUNTER — Other Ambulatory Visit: Payer: Self-pay

## 2021-04-30 VITALS — BP 139/73 | HR 73 | Resp 16 | Wt 148.0 lb

## 2021-04-30 DIAGNOSIS — I10 Essential (primary) hypertension: Secondary | ICD-10-CM | POA: Diagnosis not present

## 2021-04-30 DIAGNOSIS — E785 Hyperlipidemia, unspecified: Secondary | ICD-10-CM

## 2021-04-30 DIAGNOSIS — E119 Type 2 diabetes mellitus without complications: Secondary | ICD-10-CM | POA: Diagnosis not present

## 2021-04-30 DIAGNOSIS — I6523 Occlusion and stenosis of bilateral carotid arteries: Secondary | ICD-10-CM

## 2021-04-30 NOTE — Progress Notes (Signed)
MRN : [id number]  Blake [name] is a 77 y.o. ([birth year]) male who presents with chief complaint of  Chief Complaint  Patient presents with   Follow-up    Ultrasound follow up  .  History of Present Illness: Patient returns in follow-up of his carotid disease.  He is doing well today and does not have any specific complaints.  No focal neurologic symptoms. Specifically, the patient denies amaurosis fugax, speech or swallowing difficulties, or arm or leg weakness or numbness.  His carotid duplex today shows progression of his right ICA stenosis now into the 80 to 99% range.  The left carotid velocities were followed in the 40 to 59% range.   Current Outpatient Medications  Medication Sig Dispense Refill   atorvastatin (LIPITOR) 80 MG tablet TAKE 1 TABLET BY MOUTH ONCE DAILY 90 tablet 3   lisinopril (ZESTRIL) 10 MG tablet TAKE 1 TABLET (10 MG TOTAL) BY MOUTH DAILY. 30 tablet 3   methocarbamol (ROBAXIN) 500 MG tablet Take 1 and 1/2 tablets (750 mg total) by mouth every 6 (six) hours as needed for muscle spasms. 90 tablet 1   metoprolol succinate (TOPROL-XL) 100 MG 24 hr tablet Take 1.5 tablets (150 mg total) by mouth once daily 135 tablet 1   omeprazole (PRILOSEC) 40 MG capsule take one capsule by mouth once daily 90 capsule 3   tamsulosin (FLOMAX) 0.4 MG CAPS capsule Take 0.4 mg by mouth daily after supper.     acetaminophen (TYLENOL) 325 MG tablet Take 2 tablets (650 mg total) by mouth every 4 (four) hours as needed for mild pain ((score 1 to 3) or temp > 100.5). (Patient not taking: Reported on 10/29/2020) 60 tablet 0   amiodarone (PACERONE) 200 MG tablet TAKE 1 TABLET BY MOUTH ONCE DAILY (Patient not taking: Reported on 04/30/2021) 90 tablet 1   amiodarone (PACERONE) 200 MG tablet Take 1 tablet (200 mg total) by mouth once daily (Patient not taking: Reported on 01/28/2021) 90 tablet 1   furosemide (LASIX) 20 MG tablet TAKE 1 TABLET (20 MG TOTAL) BY MOUTH DAILY. (Patient not taking:  Reported on 10/29/2020) 30 tablet 2   gabapentin (NEURONTIN) 300 MG capsule TAKE 1 CAPSULE (300 MG TOTAL) BY MOUTH 3 (THREE) TIMES DAILY (Patient not taking: No sig reported) 90 capsule 11   hydrocortisone 2.5 % lotion For seborrheic dermatitis. Apply to affected area on face every Monday, Wednesday, and Friday. (Patient not taking: Reported on 10/29/2020) 59 mL 3   metFORMIN (GLUCOPHAGE) 500 MG tablet TAKE 1 TABLET BY MOUTH TWICE DAILY WITH MEALS (Patient not taking: Reported on 04/30/2021) 60 tablet 11   metoprolol succinate (TOPROL-XL) 100 MG 24 hr tablet Take 100 mg by mouth daily. (Patient not taking: Reported on 01/28/2021)     metoprolol succinate (TOPROL-XL) 100 MG 24 hr tablet Take one tablet by mouth once daily (Patient not taking: Reported on 01/28/2021) 90 tablet 1   mirtazapine (REMERON) 15 MG tablet Take 1 tablet (15 mg total) by mouth nightly (Patient not taking: Reported on 01/28/2021) 30 tablet 11   omeprazole (PRILOSEC) 40 MG capsule Take 40 mg by mouth 2 (two) times daily. (Patient not taking: Reported on 10/29/2020)  3   oxyCODONE (OXY IR/ROXICODONE) 5 MG immediate release tablet Take 1-2 tablets (5-10 mg total) by mouth every 4 (four) hours as needed for moderate pain (5 mg for pain 4-6/10, 10 mg for 7-10/10). (Patient not taking: Reported on 10/29/2020) 60 tablet 0   promethazine (PHENERGAN) 25 MG  tablet Take 1 tablet (25 mg total) by mouth every 6 (six) hours as needed. (Patient not taking: Reported on 04/30/2021) 60 tablet 1   senna-docusate (SENOKOT-S) 8.6-50 MG tablet Take 1 tablet by mouth 2 (two) times daily. (Patient not taking: Reported on 10/29/2020) 60 tablet 0   tamsulosin (FLOMAX) 0.4 MG CAPS capsule TAKE 1 CAPSULE BY MOUTH ONCE DAILY. TAKE 30 MINUTES AFTER SAME MEAL EACH DAY. (Patient not taking: Reported on 10/29/2020) 90 capsule 3   tamsulosin (FLOMAX) 0.4 MG CAPS capsule Take 1 capsule (0.4 mg total) by mouth once daily. TAKE 30 MINUTES AFTER SAME MEAL EACH DAY (Patient not  taking: Reported on 10/29/2020) 90 capsule 3   No current facility-administered medications for this visit.    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, bilateral    Dyspnea    Dysrhythmia    Esophageal cancer ([place]) 2021   GERD (gastroesophageal reflux disease)    Hyperlipidemia    Hypertension    Numbness and tingling of both lower extremities    with positioning   Paroxysmal atrial fibrillation ([place])    Port-A-Cath in place 2006   Prostate enlargement    Squamous cell carcinoma of skin 12/14/2019   L pretibial - ED&C    T2DM (type 2 diabetes mellitus) ([place])     Past Surgical History:  Procedure Laterality Date   ABDOMINAL SURGERY     COLONOSCOPY  12/08/2003   COLONOSCOPY WITH PROPOFOL N/A 03/21/2019   Procedure: COLONOSCOPY WITH PROPOFOL;  Surgeon: Toledo, Benay Pike, MD;  Location: [facility];  Service: Gastroenterology;  Laterality: N/A;   ESOPHAGOGASTRODUODENOSCOPY (EGD) WITH PROPOFOL N/A 03/21/2019   Procedure: ESOPHAGOGASTRODUODENOSCOPY (EGD) WITH PROPOFOL;  Surgeon: Toledo, Benay Pike, MD;  Location: [facility];  Service: Gastroenterology;  Laterality: N/A;   esophogeal cancer     JOINT REPLACEMENT Right    total hip   LUMBAR LAMINECTOMY/DECOMPRESSION MICRODISCECTOMY N/A 03/03/2018   Procedure: LUMBAR LAMINECTOMY/DECOMPRESSION MICRODISCECTOMY 1 LEVEL-L3-4,L4-5;  Surgeon: Meade Maw, MD;  Location: [facility];  Service: Neurosurgery;  Laterality: N/A;   LUMBAR LAMINECTOMY/DECOMPRESSION  MICRODISCECTOMY N/A 02/24/2020   Procedure: LEFT L3-4 MICRODISCECTOMY, L4-5 DECOMPRESSION;  Surgeon: Meade Maw, MD;  Location: [facility];  Service: Neurosurgery;  Laterality: N/A;   MAXIMUM ACCESS (MAS) TRANSFORAMINAL LUMBAR INTERBODY FUSION (TLIF) 1 LEVEL N/A 07/13/2020   Procedure: OPEN L3-4 TRANSFORAMINAL LUMBAR INTERBODY FUSION (TLIF);  Surgeon: Meade Maw, MD;  Location: [facility];  Service: Neurosurgery;  Laterality: N/A;   PARTIAL HIP ARTHROPLASTY Right 2014   PORT-A-CATH REMOVAL     PORTACATH PLACEMENT Left 01/12/2020   Procedure: INSERTION PORT-A-CATH, with ultrasound fluoroscopy;  Surgeon: Nestor Lewandowsky, MD;  Location: [facility];  Service: General;  Laterality: Left;   TONGUE BIOPSY  2006   TONSILLECTOMY     TRIGGER FINGER RELEASE Right 2013   UPPER GI ENDOSCOPY  01/25/14   multiple gastric polyps  Social History   Tobacco Use   Smoking status: Former    Packs/day: 1.00    Years: 30.00    Pack years: 30.00    Types: Cigarettes    Quit date: 02/18/1994    Years since quitting: 27.2   Smokeless tobacco: Never  Vaping Use   Vaping Use: Never used  Substance Use Topics   Alcohol use: Not Currently   Drug use: No      Family History  Problem Relation Age of Onset   Diabetes Mother    Congestive Heart Failure Mother    Breast cancer Mother    Lung cancer Father      Allergies  Allergen Reactions   Sulfa Antibiotics Rash    REVIEW OF SYSTEMS (Negative unless checked)   Constitutional: [x] Weight loss  [] Fever  [] Chills Cardiac: [] Chest pain   [] Chest pressure   [] Palpitations   [] Shortness of breath when laying flat   [] Shortness of breath at rest   [] Shortness of breath with exertion. Vascular:  [] Pain in legs with walking   [] Pain in legs at rest   [] Pain in legs when laying flat   [] Claudication   [] Pain in feet when walking  [] Pain in feet at rest  [] Pain in feet when laying flat   [] History of DVT   [] Phlebitis   [] Swelling in legs    [] Varicose veins   [] Non-healing ulcers Pulmonary:   [] Uses home oxygen   [] Productive cough   [] Hemoptysis   [] Wheeze  [] COPD   [] Asthma Neurologic:  [] Dizziness  [] Blackouts   [] Seizures   [] History of stroke   [] History of TIA  [] Aphasia   [] Temporary blindness   [] Dysphagia   [] Weakness or numbness in arms   [] Weakness or numbness in legs Musculoskeletal:  [x] Arthritis   [] Joint swelling   [x] Joint pain   [] Low back pain Hematologic:  [] Easy bruising  [] Easy bleeding   [] Hypercoagulable state   [] Anemic  [] Hepatitis Gastrointestinal:  [] Blood in stool   [] Vomiting blood  [x] Gastroesophageal reflux/heartburn   [x] Difficulty swallowing. Genitourinary:  [] Chronic kidney disease   [] Difficult urination  [] Frequent urination  [] Burning with urination   [] Blood in urine Skin:  [] Rashes   [] Ulcers   [] Wounds Psychological:  [] History of anxiety   []  History of major depression.  Physical Examination  Vitals:   04/30/21 1438  BP: 139/73  Pulse: 73  Resp: 16  Weight: 148 lb (67.1 kg)   Body mass index is 24.63 kg/m. Gen:  WD/WN, NAD Head: [place]/AT, No temporalis wasting. Ear/Nose/Throat: Hearing grossly intact, nares w/o erythema or drainage, trachea midline Eyes: Conjunctiva clear. Sclera non-icteric Neck: Supple.  Bilateral carotid bruit  Pulmonary:  Good air movement, equal and clear to auscultation bilaterally.  Cardiac: RRR, No JVD Vascular:  Vessel Right Left  Radial Palpable Palpable           Musculoskeletal: M/S 5/5 throughout.  No deformity or atrophy. No edema. Neurologic: CN 2-12 intact. Sensation grossly intact in extremities.  Symmetrical.  Speech is fluent. Motor exam as listed above. Psychiatric: Judgment intact, Mood & affect appropriate for pt's clinical situation. Dermatologic: No rashes or ulcers noted.  No cellulitis or open wounds. Lymph : No Cervical, Axillary, or Inguinal lymphadenopathy.    CBC Lab Results  Component Value Date   WBC 5.8 01/25/2021   HGB  12.0 (L) 01/25/2021   HCT 40.0 01/25/2021   MCV 81.3 01/25/2021   PLT 265 01/25/2021    BMET    Component Value  Date/Time   NA 137 01/25/2021 1310   NA 130 (L) 01/26/2013 0505   K 4.2 01/25/2021 1310   K 4.6 01/26/2013 0505   CL 104 01/25/2021 1310   CL 101 01/26/2013 0505   CO2 27 01/25/2021 1310   CO2 26 01/26/2013 0505   GLUCOSE 233 (H) 01/25/2021 1310   GLUCOSE 138 (H) 01/26/2013 0505   BUN 16 01/25/2021 1310   BUN 19 (H) 01/26/2013 0505   CREATININE 1.00 01/25/2021 1310   CREATININE 1.18 01/26/2013 0505   CALCIUM 8.1 (L) 01/25/2021 1310   CALCIUM 8.3 (L) 01/26/2013 0505   GFRNONAA >60 01/25/2021 1310   GFRNONAA >60 01/26/2013 0505   GFRAA >60 12/28/2019 1328   GFRAA >60 01/26/2013 0505   CrCl cannot be calculated (Patient's most recent lab result is older than the maximum 21 days allowed.).  COAG Lab Results  Component Value Date   INR 1.0 07/04/2020   INR 1.0 02/17/2020   INR 1.0 01/10/2020    Radiology VAS US CAROTID  Result Date: 04/30/2021 Carotid Arterial Duplex Study Patient Name:  Blake [name]  Date of Exam:   04/30/2021 Medical Rec #: [id number]       Accession #:    [id number] Date of Birth: Oct 30, 1945      Patient Gender: M Patient Age:   72 years Exam Location:  [facility] Procedure:      VAS US CAROTID Referring Phys: Leotis Pain --------------------------------------------------------------------------------  Indications:       Carotid artery disease. Comparison Study:  10/23/2020 Performing Technologist: Almira Coaster RVS  Examination Guidelines: A complete evaluation includes B-mode imaging, spectral Doppler, color Doppler, and power Doppler as needed of all accessible portions of each vessel. Bilateral testing is considered an integral part of a complete examination. Limited examinations for reoccurring indications may be performed as noted.  Right Carotid Findings: +----------+--------+--------+--------+------------------+--------+              PSV cm/s EDV cm/s Stenosis Plaque Description Comments  +----------+--------+--------+--------+------------------+--------+  CCA Prox   103      21                                             +----------+--------+--------+--------+------------------+--------+  CCA Mid    104      22                                             +----------+--------+--------+--------+------------------+--------+  CCA Distal 88       19                                             +----------+--------+--------+--------+------------------+--------+  ICA Prox   109      29                                             +----------+--------+--------+--------+------------------+--------+  ICA Mid    326      92                                             +----------+--------+--------+--------+------------------+--------+  ICA Distal 69       17                                             +----------+--------+--------+--------+------------------+--------+  ECA        115      13                                             +----------+--------+--------+--------+------------------+--------+ +----------+--------+-------+--------+-------------------+             PSV cm/s EDV cms Describe Arm Pressure (mmHG)  +----------+--------+-------+--------+-------------------+  Subclavian 100      0                                     +----------+--------+-------+--------+-------------------+ +---------+--------+---+--------+--+  Vertebral PSV cm/s 106 EDV cm/s 18  +---------+--------+---+--------+--+  Left Carotid Findings: +----------+--------+--------+--------+------------------+--------+             PSV cm/s EDV cm/s Stenosis Plaque Description Comments  +----------+--------+--------+--------+------------------+--------+  CCA Prox   148      27                                             +----------+--------+--------+--------+------------------+--------+  CCA Mid    132      24                                              +----------+--------+--------+--------+------------------+--------+  CCA Distal 114      23                                             +----------+--------+--------+--------+------------------+--------+  ICA Prox   173      29                                             +----------+--------+--------+--------+------------------+--------+  ICA Mid    225      58                                             +----------+--------+--------+--------+------------------+--------+  ICA Distal 127      36                                             +----------+--------+--------+--------+------------------+--------+  ECA        90       15                                             +----------+--------+--------+--------+------------------+--------+ +----------+--------+--------+--------+-------------------+  PSV cm/s EDV cm/s Describe Arm Pressure (mmHG)  +----------+--------+--------+--------+-------------------+  Subclavian 269      0                                      +----------+--------+--------+--------+-------------------+ +---------+--------+--+--------+-+  Vertebral PSV cm/s 36 EDV cm/s 7  +---------+--------+--+--------+-+   Summary: Right Carotid: Velocities in the Right ICA suggest 80% stenosis. Left Carotid: Velocities in the Left ICA suggest 40-59% Stenosis on the High end               of the Scale. Vertebrals:  Bilateral vertebral arteries demonstrate antegrade flow. Subclavians: Normal flow hemodynamics were seen in bilateral subclavian              arteries. *See table(s) above for measurements and observations.  Electronically signed by Leotis Pain MD on 04/30/2021 at 4:41:41 PM.    Final      Assessment/Plan Carotid stenosis His carotid duplex today shows progression of his right ICA stenosis now into the 80 to 99% range.  The left carotid velocities were followed in the 40 to 59% range. The patient remains asymptomatic with respect to the carotid stenosis.  However, the patient has now  progressed and has a lesion the is >70%.  Patient should undergo CT angiography of the carotid arteries to define the degree of stenosis of the internal carotid arteries bilaterally and the anatomic suitability for surgery vs. intervention.  If the patient does indeed need surgery cardiac clearance will be required, once cleared the patient will be scheduled for surgery.  The risks, benefits and alternative therapies were reviewed in detail with the patient.  All questions were answered.  The patient agrees to proceed with imaging.  Continue antiplatelet therapy as prescribed. Continue management of CAD, HTN and Hyperlipidemia. Healthy heart diet, encouraged exercise at least 4 times per week.    Essential (primary) hypertension blood pressure control important in reducing the progression of atherosclerotic disease. On appropriate oral medications.   Diabetes mellitus ([place]) blood glucose control important in reducing the progression of atherosclerotic disease. Also, involved in wound healing. On appropriate medications.   HLD (hyperlipidemia) lipid control important in reducing the progression of atherosclerotic disease. Continue statin therapy    Leotis Pain, MD  05/01/2021 9:22 AM    This note was created with Dragon medical transcription system.  Any errors from dictation are purely unintentional

## 2021-05-01 NOTE — Assessment & Plan Note (Signed)
His carotid duplex today shows progression of his right ICA stenosis now into the 80 to 99% range.  The left carotid velocities were followed in the 40 to 59% range. The patient remains asymptomatic with respect to the carotid stenosis.  However, the patient has now progressed and has a lesion the is >70%.  Patient should undergo CT angiography of the carotid arteries to define the degree of stenosis of the internal carotid arteries bilaterally and the anatomic suitability for surgery vs. intervention.  If the patient does indeed need surgery cardiac clearance will be required, once cleared the patient will be scheduled for surgery.  The risks, benefits and alternative therapies were reviewed in detail with the patient.  All questions were answered.  The patient agrees to proceed with imaging.  Continue antiplatelet therapy as prescribed. Continue management of CAD, HTN and Hyperlipidemia. Healthy heart diet, encouraged exercise at least 4 times per week.

## 2021-05-01 NOTE — Assessment & Plan Note (Signed)
blood glucose control important in reducing the progression of atherosclerotic disease. Also, involved in wound healing. On appropriate medications.  

## 2021-05-01 NOTE — Assessment & Plan Note (Signed)
blood pressure control important in reducing the progression of atherosclerotic disease. On appropriate oral medications.  

## 2021-05-01 NOTE — Assessment & Plan Note (Signed)
lipid control important in reducing the progression of atherosclerotic disease. Continue statin therapy  

## 2021-05-06 ENCOUNTER — Ambulatory Visit
Payer: Medicare HMO | Attending: Student in an Organized Health Care Education/Training Program | Admitting: Student in an Organized Health Care Education/Training Program

## 2021-05-06 ENCOUNTER — Other Ambulatory Visit: Payer: Self-pay

## 2021-05-06 ENCOUNTER — Encounter: Payer: Self-pay | Admitting: Student in an Organized Health Care Education/Training Program

## 2021-05-06 VITALS — BP 153/60 | HR 71 | Temp 98.2°F | Resp 18 | Ht 65.0 in | Wt 150.0 lb

## 2021-05-06 DIAGNOSIS — M48062 Spinal stenosis, lumbar region with neurogenic claudication: Secondary | ICD-10-CM | POA: Diagnosis not present

## 2021-05-06 DIAGNOSIS — M5416 Radiculopathy, lumbar region: Secondary | ICD-10-CM | POA: Diagnosis not present

## 2021-05-06 DIAGNOSIS — G894 Chronic pain syndrome: Secondary | ICD-10-CM | POA: Insufficient documentation

## 2021-05-06 MED ORDER — LISINOPRIL 10 MG PO TABS
ORAL_TABLET | Freq: Every day | ORAL | 3 refills | Status: DC
  Filled 2021-05-06: qty 30, 30d supply, fill #0
  Filled 2021-06-07: qty 30, 30d supply, fill #1
  Filled 2021-07-08: qty 30, 30d supply, fill #2
  Filled 2021-08-06: qty 30, 30d supply, fill #3

## 2021-05-06 NOTE — Patient Instructions (Signed)
Epidural Steroid Injection Patient Information  Description: The epidural space surrounds the nerves as they exit the spinal cord.  In some patients, the nerves can be compressed and inflamed by a bulging disc or a tight spinal canal (spinal stenosis).  By injecting steroids into the epidural space, we can bring irritated nerves into direct contact with a potentially helpful medication.  These steroids act directly on the irritated nerves and can reduce swelling and inflammation which often leads to decreased pain.  Epidural steroids may be injected anywhere along the spine and from the neck to the low back depending upon the location of your pain.   After numbing the skin with local anesthetic (like Novocaine), a small needle is passed into the epidural space slowly.  You may experience a sensation of pressure while this is being done.  The entire block usually last less than 10 minutes.  Conditions which may be treated by epidural steroids:  Low back and leg pain Neck and arm pain Spinal stenosis Post-laminectomy syndrome Herpes zoster (shingles) pain Pain from compression fractures  Preparation for the injection:  Do not eat any solid food or dairy products within 8 hours of your appointment.  You may drink clear liquids up to 3 hours before appointment.  Clear liquids include water, black coffee, juice or soda.  No milk or cream please. You may take your regular medication, including pain medications, with a sip of water before your appointment  Diabetics should hold regular insulin (if taken separately) and take 1/2 normal NPH dos the morning of the procedure.  Carry some sugar containing items with you to your appointment. A driver must accompany you and be prepared to drive you home after your procedure.  Bring all your current medications with your. An IV may be inserted and sedation may be given at the discretion of the physician.   A blood pressure cuff, EKG and other monitors will  often be applied during the procedure.  Some patients may need to have extra oxygen administered for a short period. You will be asked to provide medical information, including your allergies, prior to the procedure.  We must know immediately if you are taking blood thinners (like Coumadin/Warfarin)  Or if you are allergic to IV iodine contrast (dye). We must know if you could possible be pregnant.  Possible side-effects: Bleeding from needle site Infection (rare, may require surgery) Nerve injury (rare) Numbness & tingling (temporary) Difficulty urinating (rare, temporary) Spinal headache ( a headache worse with upright posture) Light -headedness (temporary) Pain at injection site (several days) Decreased blood pressure (temporary) Weakness in arm/leg (temporary) Pressure sensation in back/neck (temporary)  Call if you experience: Fever/chills associated with headache or increased back/neck pain. Headache worsened by an upright position. New onset weakness or numbness of an extremity below the injection site Hives or difficulty breathing (go to the emergency room) Inflammation or drainage at the infection site Severe back/neck pain Any new symptoms which are concerning to you  Please note:  Although the local anesthetic injected can often make your back or neck feel good for several hours after the injection, the pain will likely return.  It takes 3-7 days for steroids to work in the epidural space.  You may not notice any pain relief for at least that one week.  If effective, we will often do a series of three injections spaced 3-6 weeks apart to maximally decrease your pain.  After the initial series, we generally will wait several months before   considering a repeat injection of the same type.  If you have any questions, please call [phone] [facility] 

## 2021-05-06 NOTE — Progress Notes (Signed)
Safety precautions to be maintained throughout the outpatient stay will include: orient to surroundings, keep bed in low position, maintain call bell within reach at all times, provide assistance with transfer out of bed and ambulation.  
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
## 2021-05-09 ENCOUNTER — Telehealth (INDEPENDENT_AMBULATORY_CARE_PROVIDER_SITE_OTHER): Payer: Self-pay | Admitting: *Deleted

## 2021-05-09 NOTE — Telephone Encounter (Signed)
Called patient to advise him of the CTA being approved. Gave him the number to central scheduling to get it scheduled

## 2021-05-10 ENCOUNTER — Other Ambulatory Visit: Payer: Self-pay

## 2021-05-16 ENCOUNTER — Other Ambulatory Visit: Payer: Self-pay

## 2021-05-16 ENCOUNTER — Ambulatory Visit: Payer: Medicare HMO | Admitting: Dermatology

## 2021-05-16 DIAGNOSIS — L578 Other skin changes due to chronic exposure to nonionizing radiation: Secondary | ICD-10-CM

## 2021-05-16 DIAGNOSIS — D692 Other nonthrombocytopenic purpura: Secondary | ICD-10-CM | POA: Diagnosis not present

## 2021-05-16 DIAGNOSIS — L820 Inflamed seborrheic keratosis: Secondary | ICD-10-CM | POA: Diagnosis not present

## 2021-05-16 DIAGNOSIS — L570 Actinic keratosis: Secondary | ICD-10-CM

## 2021-05-16 NOTE — Progress Notes (Signed)
Follow-Up Visit   Subjective  Blake [name] is a 76 y.o. male who presents for the following: Follow-up (Patient here today concerning a left sideburn that itches and bleeds. Patient also reports some other rough spots at face and ears. ). The patient has spots, moles and lesions to be evaluated, some may be new or changing and the patient has concerns that these could be cancer.  The following portions of the chart were reviewed this encounter and updated as appropriate:  Tobacco   Allergies   Meds   Problems   Med Hx   Surg Hx   Fam Hx      Review of Systems: No other skin or systemic complaints except as noted in HPI or Assessment and Plan.  Objective  Well appearing patient in no apparent distress; mood and affect are within normal limits.  A focused examination was performed including face, ears. Relevant physical exam findings are noted in the Assessment and Plan.  right ear x 1, right dorsum hand x 1, left ear x 1, left preauricular x 1, right preauricular x 1, right underside chin x 1 (6) Erythematous thin papules/macules with gritty scale.   left sideburn area x 2, right cheek x 1 (3) Erythematous stuck-on, waxy papule or plaque   Assessment & Plan  Actinic keratosis (6) right ear x 1, right dorsum hand x 1, left ear x 1, left preauricular x 1, right preauricular x 1, right underside chin x 1  Will recheck in 2 months    Actinic keratoses are precancerous spots that appear secondary to cumulative UV radiation exposure/sun exposure over time. They are chronic with expected duration over 1 year. A portion of actinic keratoses will progress to squamous cell carcinoma of the skin. It is not possible to reliably predict which spots will progress to skin cancer and so treatment is recommended to prevent development of skin cancer.  Recommend daily broad spectrum sunscreen SPF 30+ to sun-exposed areas, reapply every 2 hours as needed.  Recommend staying in the shade or wearing  long sleeves, sun glasses (UVA+UVB protection) and wide brim hats (4-inch brim around the entire circumference of the hat). Call for new or changing lesions.  Destruction of lesion - right ear x 1, right dorsum hand x 1, left ear x 1, left preauricular x 1, right preauricular x 1, right underside chin x 1 Complexity: simple   Destruction method: cryotherapy   Informed consent: discussed and consent obtained   Timeout:  patient name, date of birth, surgical site, and procedure verified Lesion destroyed using liquid nitrogen: Yes   Region frozen until ice ball extended beyond lesion: Yes   Outcome: patient tolerated procedure well with no complications   Post-procedure details: wound care instructions given   Additional details:  Prior to procedure, discussed risks of blister formation, small wound, skin dyspigmentation, or rare scar following cryotherapy. Recommend Vaseline ointment to treated areas while healing.   Inflamed seborrheic keratosis (3) left sideburn area x 2, right cheek x 1  Irritated and bothering patient  Destruction of lesion - left sideburn area x 2, right cheek x 1 Complexity: simple   Destruction method: cryotherapy   Informed consent: discussed and consent obtained   Timeout:  patient name, date of birth, surgical site, and procedure verified Lesion destroyed using liquid nitrogen: Yes   Region frozen until ice ball extended beyond lesion: Yes   Outcome: patient tolerated procedure well with no complications   Post-procedure details: wound care  instructions given   Additional details:  Prior to procedure, discussed risks of blister formation, small wound, skin dyspigmentation, or rare scar following cryotherapy. Recommend Vaseline ointment to treated areas while healing.   Purpura - Chronic; persistent and recurrent.  Treatable, but not curable. - Violaceous macules and patches - Benign - Related to trauma, age, sun damage and/or use of blood thinners, chronic  use of topical and/or oral steroids - Observe - Can use OTC arnica containing moisturizer such as Dermend Bruise Formula if desired - Call for worsening or other concerns  Actinic Damage - chronic, secondary to cumulative UV radiation exposure/sun exposure over time - diffuse scaly erythematous macules with underlying dyspigmentation - Recommend daily broad spectrum sunscreen SPF 30+ to sun-exposed areas, reapply every 2 hours as needed.  - Recommend staying in the shade or wearing long sleeves, sun glasses (UVA+UVB protection) and wide brim hats (4-inch brim around the entire circumference of the hat). - Call for new or changing lesions.  Return for 2 month ak follow up. IRuthell Rummage, CMA, am acting as scribe for Sarina Ser, MD. Documentation: I have reviewed the above documentation for accuracy and completeness, and I agree with the above.  Sarina Ser, MD

## 2021-05-16 NOTE — Patient Instructions (Addendum)
Actinic keratoses are precancerous spots that appear secondary to cumulative UV radiation exposure/sun exposure over time. They are chronic with expected duration over 1 year. A portion of actinic keratoses will progress to squamous cell carcinoma of the skin. It is not possible to reliably predict which spots will progress to skin cancer and so treatment is recommended to prevent development of skin cancer.  Recommend daily broad spectrum sunscreen SPF 30+ to sun-exposed areas, reapply every 2 hours as needed.  Recommend staying in the shade or wearing long sleeves, sun glasses (UVA+UVB protection) and wide brim hats (4-inch brim around the entire circumference of the hat). Call for new or changing lesions.   Cryotherapy Aftercare  Wash gently with soap and water everyday.   Apply Vaseline and Band-Aid daily until healed.         If You Need Anything After Your Visit  If you have any questions or concerns for your doctor, please call our main line at [phone] and press option 4 to reach your doctor's medical assistant. If no one answers, please leave a voicemail as directed and we will return your call as soon as possible. Messages left after 4 pm will be answered the following business day.   You may also send [place] a message via Kinder. We typically respond to MyChart messages within 1-2 business days.  For prescription refills, please ask your pharmacy to contact our office. Our fax number is [phone].  If you have an urgent issue when the clinic is closed that cannot wait until the next business day, you can page your doctor at the number below.    Please note that while we do our best to be available for urgent issues outside of office hours, we are not available 24/7.   If you have an urgent issue and are unable to reach [place], you may choose to seek medical care at your doctor's office, retail clinic, urgent care center, or emergency room.  If you have a medical emergency,  please immediately call 911 or go to the emergency department.  Pager Numbers  - Dr. Nehemiah Massed: [phone]  - Dr. Laurence Ferrari: [phone]  - Dr. Nicole Kindred: [phone]  In the event of inclement weather, please call our main line at [phone] for an update on the status of any delays or closures.  Dermatology Medication Tips: Please keep the boxes that topical medications come in in order to help keep track of the instructions about where and how to use these. Pharmacies typically print the medication instructions only on the boxes and not directly on the medication tubes.   If your medication is too expensive, please contact our office at [phone] option 4 or send [place] a message through [place].   We are unable to tell what your co-pay for medications will be in advance as this is different depending on your insurance coverage. However, we may be able to find a substitute medication at lower cost or fill out paperwork to get insurance to cover a needed medication.   If a prior authorization is required to get your medication covered by your insurance company, please allow [place] 1-2 business days to complete this process.  Drug prices often vary depending on where the prescription is filled and some pharmacies may offer cheaper prices.  The website www.goodrx.com contains coupons for medications through different pharmacies. The prices here do not account for what the cost may be with help from insurance (it may be cheaper with your insurance), but the website can  give you the price if you did not use any insurance.  - You can print the associated coupon and take it with your prescription to the pharmacy.  - You may also stop by our office during regular business hours and pick up a GoodRx coupon card.  - If you need your prescription sent electronically to a different pharmacy, notify our office through [facility] or by phone at [phone] option 4.     Si Usted Necesita Algo  Despus de Su Visita  Tambin puede enviarnos un mensaje a travs de Pharmacist, community. Por lo general respondemos a los mensajes de MyChart en el transcurso de 1 a 2 das hbiles.  Para renovar recetas, por favor pida a su farmacia que se ponga en contacto con nuestra oficina. Harland Dingwall de fax es [place] [phone].  Si tiene un asunto urgente cuando la clnica est cerrada y que no puede esperar hasta el siguiente da hbil, puede llamar/localizar a su doctor(a) al nmero que aparece a continuacin.   Por favor, tenga en cuenta que aunque hacemos todo lo posible para estar disponibles para asuntos urgentes fuera del horario de [place], no estamos disponibles las 24 horas del da, los 7 das de la [place].   Si tiene un problema urgente y no puede comunicarse con nosotros, puede optar por buscar atencin mdica  en el consultorio de su doctor(a), en una clnica privada, en un centro de atencin urgente o en una sala de emergencias.  Si tiene Engineering geologist, por favor llame inmediatamente al 911 o vaya a la sala de emergencias.  Nmeros de bper  - Dr. Nehemiah Massed: [phone]  - Dra. Moye: [phone]  - Dra. Nicole Kindred: [phone]  En caso de inclemencias del [place], por favor llame a Johnsie Kindred principal al [phone] para una actualizacin sobre el [place] de cualquier retraso o cierre.  Consejos para la medicacin en dermatologa: Por favor, guarde las cajas en las que vienen los medicamentos de uso tpico para ayudarle a seguir las instrucciones sobre dnde y cmo usarlos. Las farmacias generalmente imprimen las instrucciones del medicamento slo en las cajas y no directamente en los tubos del [place].   Si su medicamento es muy caro, por favor, pngase en contacto con Zigmund Daniel llamando al [phone] y presione la opcin 4 o envenos un mensaje a travs de Pharmacist, community.   No podemos decirle cul ser su copago por los medicamentos por adelantado ya que esto es diferente  dependiendo de la cobertura de su seguro. Sin embargo, es posible que podamos encontrar un medicamento sustituto a Electrical engineer un formulario para que el seguro cubra el medicamento que se considera necesario.   Si se requiere una autorizacin previa para que su compaa de seguros [place] su medicamento, por favor permtanos de 1 a 2 das hbiles para completar este proceso.  Los precios de los medicamentos varan con frecuencia dependiendo del Environmental consultant de dnde se surte la receta y alguna farmacias pueden ofrecer precios ms baratos.  El sitio web www.goodrx.com tiene cupones para medicamentos de Airline pilot. Los precios aqu no tienen en cuenta lo que podra costar con la ayuda del seguro (puede ser ms barato con su seguro), pero el sitio web puede darle el precio si no utiliz Research scientist (physical sciences).  - Puede imprimir el cupn correspondiente y llevarlo con su receta a la farmacia.  - Tambin puede pasar por nuestra oficina durante el horario de atencin regular y Charity fundraiser una tarjeta de cupones de GoodRx.  -  Si necesita que su receta se enve electrnicamente a Chiropodist, informe a nuestra oficina a travs de MyChart de [place] o por telfono llamando al [phone] y presione la opcin 4.

## 2021-05-20 ENCOUNTER — Ambulatory Visit (HOSPITAL_BASED_OUTPATIENT_CLINIC_OR_DEPARTMENT_OTHER): Payer: Medicare HMO | Admitting: Student in an Organized Health Care Education/Training Program

## 2021-05-20 ENCOUNTER — Other Ambulatory Visit: Payer: Self-pay

## 2021-05-20 ENCOUNTER — Encounter: Payer: Self-pay | Admitting: Student in an Organized Health Care Education/Training Program

## 2021-05-20 ENCOUNTER — Ambulatory Visit
Admission: RE | Admit: 2021-05-20 | Discharge: 2021-05-20 | Disposition: A | Discharge status: Home / Self Care | Payer: Medicare HMO | Source: Ambulatory Visit | Attending: Student in an Organized Health Care Education/Training Program | Admitting: Student in an Organized Health Care Education/Training Program

## 2021-05-20 DIAGNOSIS — G894 Chronic pain syndrome: Secondary | ICD-10-CM | POA: Insufficient documentation

## 2021-05-20 DIAGNOSIS — M5416 Radiculopathy, lumbar region: Secondary | ICD-10-CM | POA: Diagnosis not present

## 2021-05-20 DIAGNOSIS — M48062 Spinal stenosis, lumbar region with neurogenic claudication: Secondary | ICD-10-CM | POA: Insufficient documentation

## 2021-05-20 MED ORDER — ROPIVACAINE HCL 2 MG/ML IJ SOLN
2.0000 mL | Freq: Once | INTRAMUSCULAR | Status: AC
  Administered 2021-05-20: 2 mL via EPIDURAL

## 2021-05-20 MED ORDER — SODIUM CHLORIDE (PF) 0.9 % IJ SOLN
INTRAMUSCULAR | Status: AC
  Filled 2021-05-20: qty 10

## 2021-05-20 MED ORDER — LIDOCAINE HCL 2 % IJ SOLN
20.0000 mL | Freq: Once | INTRAMUSCULAR | Status: AC
  Administered 2021-05-20: 200 mg

## 2021-05-20 MED ORDER — DEXAMETHASONE SODIUM PHOSPHATE 10 MG/ML IJ SOLN
INTRAMUSCULAR | Status: AC
  Filled 2021-05-20: qty 1

## 2021-05-20 MED ORDER — ROPIVACAINE HCL 2 MG/ML IJ SOLN
INTRAMUSCULAR | Status: AC
  Filled 2021-05-20: qty 20

## 2021-05-20 MED ORDER — SODIUM CHLORIDE 0.9% FLUSH
2.0000 mL | Freq: Once | INTRAVENOUS | Status: AC
  Administered 2021-05-20: 2 mL

## 2021-05-20 MED ORDER — DEXAMETHASONE SODIUM PHOSPHATE 10 MG/ML IJ SOLN
10.0000 mg | Freq: Once | INTRAMUSCULAR | Status: AC
  Administered 2021-05-20: 10 mg

## 2021-05-20 MED ORDER — IOHEXOL 180 MG/ML  SOLN
10.0000 mL | Freq: Once | INTRAMUSCULAR | Status: AC
  Administered 2021-05-20: 10 mL via EPIDURAL

## 2021-05-20 NOTE — Progress Notes (Signed)
PROVIDER NOTE: Information contained herein reflects review and annotations entered in association with encounter. Interpretation of such information and data should be left to medically-trained personnel. Information provided to patient can be located elsewhere in the medical record under "Patient Instructions". Document created using STT-dictation technology, any transcriptional errors that may result from process are unintentional.    Patient: Blake [name]  Service Category: Procedure  Provider: Gillis Santa, MD  DOB: 04-27-1945  DOS: 05/20/2021  Location: [facility]  MRN: [id number]  Setting: Ambulatory - outpatient  Referring Provider: Gillis Santa, MD  Type: Established Patient  Specialty: Interventional Pain Management  PCP: Idelle Crouch, MD   Primary Reason for Visit: Interventional Pain Management Treatment. CC: Back Pain  Procedure:          Anesthesia, Analgesia, Anxiolysis:  Type: Therapeutic Epidural Steroid Injection #3 (#2 done 06/11/20) Region: Caudal Level: Sacrococcygeal   Laterality: Midline aiming at the right     Type: Local Anesthesia  Local Anesthetic: Lidocaine 1-2%  Position: Prone   Indications: 1. Lumbar radiculopathy   2. Spinal stenosis, lumbar region, with neurogenic claudication   3. Chronic pain syndrome    Pain Score: Pre-procedure: 4 /10 Post-procedure: 0-No pain/10   Pre-op H&P Assessment:  Mr. Kitson is a 76 y.o. (year old), male patient, seen today for interventional treatment. He  has a past surgical history that includes Partial hip arthroplasty (Right, 2014); Tongue Biopsy (2006); Port-a-cath removal; Colonoscopy (12/08/2003); Tonsillectomy; Upper gi endoscopy (01/25/14); Lumbar laminectomy/decompression microdiscectomy (N/A, 03/03/2018); Joint replacement (Right); Trigger finger release (Right, 2013); Colonoscopy with propofol (N/A, 03/21/2019); Esophagogastroduodenoscopy (egd) with propofol (N/A, 03/21/2019); esophogeal  cancer; Abdominal surgery; Portacath placement (Left, 01/12/2020); Lumbar laminectomy/decompression microdiscectomy (N/A, 02/24/2020); and Maximum access (MAS) transforaminal lumbar interbody fusion (TLIF) 1 level (N/A, 07/13/2020). Mr. Littler has a current medication list which includes the following prescription(s): lisinopril, methocarbamol, metoprolol succinate, omeprazole, tamsulosin, acetaminophen, amiodarone, amiodarone, atorvastatin, furosemide, gabapentin, hydrocortisone, metformin, metoprolol succinate, metoprolol succinate, mirtazapine, omeprazole, oxycodone, promethazine, senna-docusate, tamsulosin, and [DISCONTINUED] prochlorperazine. His primarily concern today is the Back Pain  Initial Vital Signs:  Pulse/HCG Rate: 67  Temp: 98.4 F (36.9 C) Resp: 16 BP: (!) 148/59 SpO2: 98 %  BMI: Estimated body mass index is 24.96 kg/m as calculated from the following:   Height as of this encounter: 5\' 5"  (1.651 m).   Weight as of this encounter: 150 lb (68 kg).  Risk Assessment: Allergies: Reviewed. He is allergic to sulfa antibiotics.  Allergy Precautions: None required Coagulopathies: Reviewed. None identified.  Blood-thinner therapy: None at this time Active Infection(s): Reviewed. None identified. Mr. Mogan is afebrile  Site Confirmation: Mr. Bulthuis was asked to confirm the procedure and laterality before marking the site Procedure checklist: Completed Consent: Before the procedure and under the influence of no sedative(s), amnesic(s), or anxiolytics, the patient was informed of the treatment options, risks and possible complications. To fulfill our ethical and legal obligations, as recommended by the American Medical Association's Code of Ethics, I have informed the patient of my clinical impression; the nature and purpose of the treatment or procedure; the risks, benefits, and possible complications of the intervention; the alternatives, including doing nothing; the risk(s) and benefit(s)  of the alternative treatment(s) or procedure(s); and the risk(s) and benefit(s) of doing nothing. The patient was provided information about the general risks and possible complications associated with the procedure. These may include, but are not limited to: failure to achieve desired goals, infection, bleeding, organ or nerve damage, allergic reactions,  paralysis, and death. In addition, the patient was informed of those risks and complications associated to Spine-related procedures, such as failure to decrease pain; infection (i.e.: Meningitis, epidural or intraspinal abscess); bleeding (i.e.: epidural hematoma, subarachnoid hemorrhage, or any other type of intraspinal or peri-dural bleeding); organ or nerve damage (i.e.: Any type of peripheral nerve, nerve root, or spinal cord injury) with subsequent damage to sensory, motor, and/or autonomic systems, resulting in permanent pain, numbness, and/or weakness of one or several areas of the body; allergic reactions; (i.e.: anaphylactic reaction); and/or death. Furthermore, the patient was informed of those risks and complications associated with the medications. These include, but are not limited to: allergic reactions (i.e.: anaphylactic or anaphylactoid reaction(s)); adrenal axis suppression; blood sugar elevation that in diabetics may result in ketoacidosis or comma; water retention that in patients with history of congestive heart failure may result in shortness of breath, pulmonary edema, and decompensation with resultant heart failure; weight gain; swelling or edema; medication-induced neural toxicity; particulate matter embolism and blood vessel occlusion with resultant organ, and/or nervous system infarction; and/or aseptic necrosis of one or more joints. Finally, the patient was informed that Medicine is not an exact science; therefore, there is also the possibility of unforeseen or unpredictable risks and/or possible complications that may result in a  catastrophic outcome. The patient indicated having understood very clearly. We have given the patient no guarantees and we have made no promises. Enough time was given to the patient to ask questions, all of which were answered to the patient's satisfaction. Mr. Ager has indicated that he wanted to continue with the procedure. Attestation: I, the ordering provider, attest that I have discussed with the patient the benefits, risks, side-effects, alternatives, likelihood of achieving goals, and potential problems during recovery for the procedure that I have provided informed consent. Date   Time: 05/20/2021 10:47 AM  Pre-Procedure Preparation:  Monitoring: As per clinic protocol. Respiration, ETCO2, SpO2, BP, heart rate and rhythm monitor placed and checked for adequate function Safety Precautions: Patient was assessed for positional comfort and pressure points before starting the procedure. Time-out: I initiated and conducted the "Time-out" before starting the procedure, as per protocol. The patient was asked to participate by confirming the accuracy of the "Time Out" information. Verification of the correct person, site, and procedure were performed and confirmed by me, the nursing staff, and the patient. "Time-out" conducted as per Joint Commission's Universal Protocol (UP.01.01.01). Time: 1114  Description of Procedure:          Target Area: Caudal Epidural Canal. Approach: Midline approach. Area Prepped: Entire Posterior Sacrococcygeal Region DuraPrep (Iodine Povacrylex [0.7% available iodine] and Isopropyl Alcohol, 74% w/w) Safety Precautions: Aspiration looking for blood return was conducted prior to all injections. At no point did we inject any substances, as a needle was being advanced. No attempts were made at seeking any paresthesias. Safe injection practices and needle disposal techniques used. Medications properly checked for expiration dates. SDV (single dose vial) medications  used. Description of the Procedure: Protocol guidelines were followed. The patient was placed in position over the fluoroscopy table. The target area was identified and the area prepped in the usual manner. Skin & deeper tissues infiltrated with local anesthetic. Appropriate amount of time allowed to pass for local anesthetics to take effect. The procedure needles were then advanced to the target area. Proper needle placement secured. Negative aspiration confirmed. Solution injected in intermittent fashion, asking for systemic symptoms every 0.5cc of injectate. The needles were then removed and  the area cleansed, making sure to leave some of the prepping solution back to take advantage of its long term bactericidal properties. Vitals:   05/20/21 1054 05/20/21 1110 05/20/21 1115 05/20/21 1118  BP: (!) 148/59 (!) 165/73 (!) 172/78 (!) 171/76  Pulse: 67 73 67 66  Resp: 16 16 16 15   Temp: 98.4 F (36.9 C)     TempSrc: Temporal     SpO2:  98% 100% 99%  Weight: 150 lb (68 kg)     Height: 5\' 5"  (1.651 m)        Start Time: 1114 hrs. End Time: 1115 hrs. Materials: 3 Needle(s) Type: Epidural needle Gauge: 22G Length: 3.5-in Medication(s): Please see orders for medications and dosing details. 6 cc solution made of 3 cc of preservative-free saline, 2 cc of 0.2% ropivacaine, 1 cc of Decadron 10 mg/cc.  Imaging Guidance (Spinal):          Type of Imaging Technique: Fluoroscopy Guidance (Spinal) Indication(s): Assistance in needle guidance and placement for procedures requiring needle placement in or near specific anatomical locations not easily accessible without such assistance. Exposure Time: Please see nurses notes. Contrast: Before injecting any contrast, we confirmed that the patient did not have an allergy to iodine, shellfish, or radiological contrast. Once satisfactory needle placement was completed at the desired level, radiological contrast was injected. Contrast injected under live  fluoroscopy. No contrast complications. See chart for type and volume of contrast used. Fluoroscopic Guidance: I was personally present during the use of fluoroscopy. "Tunnel Vision Technique" used to obtain the best possible view of the target area. Parallax error corrected before commencing the procedure. "Direction-depth-direction" technique used to introduce the needle under continuous pulsed fluoroscopy. Once target was reached, antero-posterior, oblique, and lateral fluoroscopic projection used confirm needle placement in all planes. Images permanently stored in EMR. Interpretation: I personally interpreted the imaging intraoperatively. Adequate needle placement confirmed in multiple planes. Appropriate spread of contrast into desired area was observed. No evidence of afferent or efferent intravascular uptake. No intrathecal or subarachnoid spread observed. Permanent images saved into the patient's record.   Post-operative Assessment:  Post-procedure Vital Signs:  Pulse/HCG Rate: 66  Temp: 98.4 F (36.9 C) Resp: 15 BP: (!) 171/76 SpO2: 99 %  EBL: None  Complications: No immediate post-treatment complications observed by team, or reported by patient.  Note: The patient tolerated the entire procedure well. A repeat set of vitals were taken after the procedure and the patient was kept under observation following institutional policy, for this type of procedure. Post-procedural neurological assessment was performed, showing return to baseline, prior to discharge. The patient was provided with post-procedure discharge instructions, including a section on how to identify potential problems. Should any problems arise concerning this procedure, the patient was given instructions to immediately contact us, at any time, without hesitation. In any case, we plan to contact the patient by telephone for a follow-up status report regarding this interventional procedure.  Comments:  No additional relevant  information.  5 out of 5 strength bilateral lower extremity: Plantar flexion, dorsiflexion, knee flexion, knee extension.   Plan of Care  Orders:  Orders Placed This Encounter  Procedures   DG PAIN CLINIC C-ARM 1-60 MIN NO REPORT    Intraoperative interpretation by procedural physician at [facility].    Standing Status:   Standing    Number of Occurrences:   1    Order Specific Question:   Reason for exam:    Answer:   Assistance in  needle guidance and placement for procedures requiring needle placement in or near specific anatomical locations not easily accessible without such assistance.   Medications ordered for procedure: Meds ordered this encounter  Medications   iohexol (OMNIPAQUE) 180 MG/ML injection 10 mL    Must be Myelogram-compatible. If not available, you may substitute with a water-soluble, non-ionic, hypoallergenic, myelogram-compatible radiological contrast medium.   lidocaine (XYLOCAINE) 2 % (with pres) injection 400 mg   ropivacaine (PF) 2 mg/mL (0.2%) (NAROPIN) injection 2 mL   sodium chloride flush (NS) 0.9 % injection 2 mL   dexamethasone (DECADRON) injection 10 mg   Medications administered: We administered iohexol, lidocaine, ropivacaine (PF) 2 mg/mL (0.2%), sodium chloride flush, and dexamethasone.  See the medical record for exact dosing, route, and time of administration.  Follow-up plan:   Return in about 4 weeks (around 06/17/2021) for Post Procedure Evaluation, virtual.      Series of 3 L4-L5 epidural steroid injections followed by L3-L4 and L4-L5 microdiscectomy and laminectomy by Dr. Cari Caraway December 2019.  Return of left radicular pain given immobility after surgery for esophageal cancer.  caudal ESI, left 12/14/2019, right 06/11/2020, 05/20/21      Recent Visits Date Type Provider Dept  05/06/21 Office Visit Gillis Santa, MD [facility]  Showing recent visits within past 90 days and meeting all other requirements Today's  Visits Date Type Provider Dept  05/20/21 Procedure visit Gillis Santa, MD [facility]  Showing today's visits and meeting all other requirements Future Appointments Date Type Provider Dept  06/18/21 Appointment Gillis Santa, MD [facility]  Showing future appointments within next 90 days and meeting all other requirements  Disposition: Discharge home  Discharge (Date   Time): 05/20/2021; 1120 hrs.   Primary Care Physician: Idelle Crouch, MD Location: [facility] Note by: Gillis Santa, MD Date: 05/20/2021; Time: 11:29 AM  Disclaimer:  Medicine is not an exact science. The only guarantee in medicine is that nothing is guaranteed. It is important to note that the decision to proceed with this intervention was based on the information collected from the patient. The Data and conclusions were drawn from the patient's questionnaire, the interview, and the physical examination. Because the information was provided in large part by the patient, it cannot be guaranteed that it has not been purposely or unconsciously manipulated. Every effort has been made to obtain as much relevant data as possible for this evaluation. It is important to note that the conclusions that lead to this procedure are derived in large part from the available data. Always take into account that the treatment will also be dependent on availability of resources and existing treatment guidelines, considered by other Pain Management Practitioners as being common knowledge and practice, at the time of the intervention. For Medico-Legal purposes, it is also important to point out that variation in procedural techniques and pharmacological choices are the acceptable norm. The indications, contraindications, technique, and results of the above procedure should only be interpreted and judged by a Board-Certified Interventional Pain Specialist with extensive familiarity and expertise in  the same exact procedure and technique.

## 2021-05-20 NOTE — Patient Instructions (Signed)

## 2021-05-20 NOTE — Progress Notes (Signed)
Safety precautions to be maintained throughout the outpatient stay will include: orient to surroundings, keep bed in low position, maintain call bell within reach at all times, provide assistance with transfer out of bed and ambulation.  

## 2021-05-21 ENCOUNTER — Inpatient Hospital Stay: Payer: Medicare HMO | Attending: Oncology

## 2021-05-21 DIAGNOSIS — C155 Malignant neoplasm of lower third of esophagus: Secondary | ICD-10-CM | POA: Diagnosis not present

## 2021-05-21 DIAGNOSIS — Z452 Encounter for adjustment and management of vascular access device: Secondary | ICD-10-CM | POA: Insufficient documentation

## 2021-05-21 DIAGNOSIS — Z95828 Presence of other vascular implants and grafts: Secondary | ICD-10-CM

## 2021-05-21 MED ORDER — SODIUM CHLORIDE 0.9% FLUSH
10.0000 mL | INTRAVENOUS | Status: DC | PRN
  Administered 2021-05-21: 10 mL via INTRAVENOUS
  Filled 2021-05-21: qty 10

## 2021-05-21 MED ORDER — HEPARIN SOD (PORK) LOCK FLUSH 100 UNIT/ML IV SOLN
500.0000 [IU] | Freq: Once | INTRAVENOUS | Status: AC
  Administered 2021-05-21: 500 [IU] via INTRAVENOUS
  Filled 2021-05-21: qty 5

## 2021-05-23 ENCOUNTER — Other Ambulatory Visit: Payer: Self-pay

## 2021-05-23 ENCOUNTER — Ambulatory Visit
Admission: RE | Admit: 2021-05-23 | Discharge: 2021-05-23 | Disposition: A | Discharge status: Home / Self Care | Payer: Medicare HMO | Source: Ambulatory Visit | Attending: Vascular Surgery | Admitting: Vascular Surgery

## 2021-05-23 DIAGNOSIS — I6523 Occlusion and stenosis of bilateral carotid arteries: Secondary | ICD-10-CM | POA: Diagnosis not present

## 2021-05-23 DIAGNOSIS — I672 Cerebral atherosclerosis: Secondary | ICD-10-CM | POA: Diagnosis not present

## 2021-05-23 LAB — POCT I-STAT CREATININE: Creatinine, Ser: 1 mg/dL (ref 0.61–1.24)

## 2021-05-23 MED ORDER — IOHEXOL 350 MG/ML SOLN
75.0000 mL | Freq: Once | INTRAVENOUS | Status: AC | PRN
  Administered 2021-05-23: 75 mL via INTRAVENOUS

## 2021-05-25 ENCOUNTER — Encounter: Payer: Self-pay | Admitting: Dermatology

## 2021-05-29 DIAGNOSIS — E782 Mixed hyperlipidemia: Secondary | ICD-10-CM | POA: Diagnosis not present

## 2021-05-29 DIAGNOSIS — E118 Type 2 diabetes mellitus with unspecified complications: Secondary | ICD-10-CM | POA: Diagnosis not present

## 2021-05-29 DIAGNOSIS — Z79899 Other long term (current) drug therapy: Secondary | ICD-10-CM | POA: Diagnosis not present

## 2021-05-29 DIAGNOSIS — I10 Essential (primary) hypertension: Secondary | ICD-10-CM | POA: Diagnosis not present

## 2021-06-07 ENCOUNTER — Other Ambulatory Visit: Payer: Self-pay

## 2021-06-10 ENCOUNTER — Other Ambulatory Visit: Payer: Self-pay

## 2021-06-17 ENCOUNTER — Encounter: Payer: Self-pay | Admitting: Student in an Organized Health Care Education/Training Program

## 2021-06-18 ENCOUNTER — Encounter: Payer: Self-pay | Admitting: Student in an Organized Health Care Education/Training Program

## 2021-06-18 ENCOUNTER — Other Ambulatory Visit: Payer: Self-pay

## 2021-06-18 ENCOUNTER — Ambulatory Visit
Payer: Medicare HMO | Attending: Student in an Organized Health Care Education/Training Program | Admitting: Student in an Organized Health Care Education/Training Program

## 2021-06-18 ENCOUNTER — Ambulatory Visit (INDEPENDENT_AMBULATORY_CARE_PROVIDER_SITE_OTHER): Payer: Medicare HMO | Admitting: Vascular Surgery

## 2021-06-18 ENCOUNTER — Encounter (INDEPENDENT_AMBULATORY_CARE_PROVIDER_SITE_OTHER): Payer: Self-pay | Admitting: Vascular Surgery

## 2021-06-18 VITALS — BP 165/75 | HR 71 | Resp 17 | Ht 65.0 in | Wt 152.0 lb

## 2021-06-18 DIAGNOSIS — I6523 Occlusion and stenosis of bilateral carotid arteries: Secondary | ICD-10-CM | POA: Diagnosis not present

## 2021-06-18 DIAGNOSIS — G894 Chronic pain syndrome: Secondary | ICD-10-CM | POA: Diagnosis not present

## 2021-06-18 DIAGNOSIS — M5416 Radiculopathy, lumbar region: Secondary | ICD-10-CM | POA: Diagnosis not present

## 2021-06-18 DIAGNOSIS — M48062 Spinal stenosis, lumbar region with neurogenic claudication: Secondary | ICD-10-CM

## 2021-06-18 DIAGNOSIS — E119 Type 2 diabetes mellitus without complications: Secondary | ICD-10-CM | POA: Diagnosis not present

## 2021-06-18 DIAGNOSIS — E785 Hyperlipidemia, unspecified: Secondary | ICD-10-CM

## 2021-06-18 DIAGNOSIS — I10 Essential (primary) hypertension: Secondary | ICD-10-CM

## 2021-06-18 MED ORDER — CLOPIDOGREL BISULFATE 75 MG PO TABS
75.0000 mg | ORAL_TABLET | Freq: Every day | ORAL | 6 refills | Status: DC
  Filled 2021-06-18: qty 30, 30d supply, fill #0
  Filled 2021-07-24: qty 30, 30d supply, fill #1
  Filled 2021-08-21: qty 30, 30d supply, fill #2
  Filled 2021-09-26: qty 30, 30d supply, fill #3
  Filled 2021-10-28: qty 30, 30d supply, fill #4
  Filled 2021-12-27: qty 30, 30d supply, fill #5
  Filled 2022-01-28: qty 30, 30d supply, fill #6

## 2021-06-18 NOTE — Progress Notes (Signed)
Patient: Blake [name]  Service Category: E/M  Provider: Gillis Santa, MD  ?DOB: 1945-11-20  DOS: 06/18/2021  Location: Office  ?MRN: [id number]  Setting: Ambulatory outpatient  Referring Provider: Idelle Crouch, MD  ?Type: Established Patient  Specialty: Interventional Pain Management  PCP: Idelle Crouch, MD  ?Location: Remote location  Delivery: TeleHealth    ? ?Virtual Encounter - Pain Management ?PROVIDER NOTE: Information contained herein reflects review and annotations entered in association with encounter. Interpretation of such information and data should be left to medically-trained personnel. Information provided to patient can be located elsewhere in the medical record under "Patient Instructions". Document created using STT-dictation technology, any transcriptional errors that may result from process are unintentional.  ?  ?Contact & Pharmacy ?Preferred: [phone] ?Home: [phone] (home) ?Mobile: [phone] (mobile) ?E-mail: [email or web address]  ?[facility] - [place], [place] ?[address] ?[place] [address] ?Phone: [phone] Fax: [phone] ? ?[facility] ?[address] ?[place] [address] ?Phone: [phone] Fax: [phone] ? ?[place] [place] ?[place] ?[place] [address] ?Phone: [phone] Fax: [phone] ? ?[facility] ?[address] ?[place] [address] ?Phone: [phone] Fax: [phone] ?  ?Pre-screening  ?Blake [name] offered "in-person" vs "virtual" encounter. He indicated preferring virtual for this encounter.  ? ?Reason ?COVID-19*  Social distancing based on CDC and AMA recommendations.  ? ?I contacted Blake [name] on 06/18/2021 via telephone.      I clearly identified myself as Gillis Santa, MD. I verified that I was speaking with the correct person using two identifiers (Name: Blake [name], and  date of birth: May 24, 1945). ? ?Consent ?I sought verbal advanced consent from Blake [name] for virtual visit interactions. I informed Blake [name] of possible security and privacy concerns, risks, and limitations associated with providing "not-in-person" medical evaluation and management services. I also informed Blake [name] of the availability of "in-person" appointments. Finally, I informed him that there would be a charge for the virtual visit and that he could be  personally, fully or partially, financially responsible for it. Blake [name] expressed understanding and agreed to proceed.  ? ?Historic Elements   ?Blake [name] is a 76 y.o. year old, male patient evaluated today after our last contact on 05/20/2021. 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 He also  has a past surgical history that includes Partial hip arthroplasty (Right, 2014); Tongue Biopsy (2006); Port-a-cath removal; Colonoscopy (12/08/2003); Tonsillectomy; Upper gi endoscopy (01/25/14); Lumbar laminectomy/decompression microdiscectomy (N/A, 03/03/2018); Joint replacement (Right); Trigger finger release (Right, 2013); Colonoscopy with propofol (N/A, 03/21/2019); Esophagogastroduodenoscopy (egd) with propofol (N/A, 03/21/2019); esophogeal cancer; Abdominal surgery; Portacath placement (Left, 01/12/2020); Lumbar  laminectomy/decompression microdiscectomy (N/A, 02/24/2020); and Maximum access (MAS) transforaminal lumbar interbody fusion (TLIF) 1 level (N/A, 07/13/2020). Blake [name] has a current medication list which includes the following prescription(s): lisinopril, methocarbamol, metoprolol succinate, omeprazole, tamsulosin, acetaminophen, amiodarone, amiodarone, atorvastatin, furosemide, gabapentin, hydrocortisone, metformin, metoprolol succinate, metoprolol succinate, mirtazapine,  omeprazole, oxycodone, promethazine, senna-docusate, tamsulosin, and [DISCONTINUED] prochlorperazine. He  reports that he quit smoking about 27 years ago. His smoking use included cigarettes. He has a 30.00 pack-year smoking history. He has never used smokeless tobacco. He reports that he does not currently use alcohol. He reports that he does not use drugs. Blake [name] is allergic to sulfa antibiotics.  ? ?HPI  ?Today, he is being contacted for a post-procedure assessment. ? ? ?Post-procedure evaluation  ? ?Type: Therapeutic Epidural Steroid Injection #3 (#2 done 06/11/20) ?Region: Caudal ?Level: Sacrococcygeal   ?Laterality: Midline aiming at the right  ?  ? ?Effectiveness:  ?Initial hour after procedure: 100 %  ?Subsequent 4-6 hours post-procedure: 25 %  ?Analgesia past initial 6 hours: 30 %  ?Ongoing improvement:  ?Analgesic:  30-35% ?Function: Back to baseline ?ROM: Blake [name] reports improvement in ROM ? ? ? ? ?Renal ?Lab Results  ?Component Value Date  ? BUN 16 01/25/2021  ? CREATININE 1.00 05/23/2021  ? GFRAA >60 12/28/2019  ? GFRNONAA >60 01/25/2021  ?  Hepatic ?Lab Results  ?Component Value Date  ? AST 22 01/25/2021  ? ALT 21 01/25/2021  ? ALBUMIN 3.4 (L) 01/25/2021  ? ALKPHOS 87 01/25/2021  ?  ?Electrolytes ?Lab Results  ?Component Value Date  ? NA 137 01/25/2021  ? K 4.2 01/25/2021  ? CL 104 01/25/2021  ? CALCIUM 8.1 (L) 01/25/2021  ?  Bone ?No results found for: [place], [id number], [id number], [id number], 25OHVITD1, 25OHVITD2, 25OHVITD3,  TESTOFREE, TESTOSTERONE  ?Inflammation (CRP: Acute Phase) (ESR: Chronic Phase) ?Lab Results  ?Component Value Date  ? ESRSEDRATE 1 01/12/2013  ?    ?  ? ?Note: Above Lab results reviewed. ? ?Imaging  ?CT Angio Neck W/Cm &/Or Wo/Cm ?CLINICAL DATA:  Carotid artery stenosis. History of head neck cancer ?with chemotherapy and radiation. ? ?EXAM: ?CT ANGIOGRAPHY NECK ? ?TECHNIQUE: ?Multidetector CT imaging of the neck was performed using the ?standard protocol during bolus administration of intravenous ?contrast. Multiplanar CT image reconstructions and MIPs were ?obtained to evaluate the vascular anatomy. Carotid stenosis ?measurements (when applicable) are obtained utilizing NASCET ?criteria, using the distal internal carotid diameter as the ?denominator. ? ?RADIATION DOSE REDUCTION: This exam was performed according to the ?departmental dose-optimization program which includes automated ?exposure control, adjustment of the mA and/or kV according to ?patient size and/or use of iterative reconstruction technique. ? ?CONTRAST:  46m OMNIPAQUE IOHEXOL 350 MG/ML SOLN ? ?COMPARISON:  None. ? ?FINDINGS: ?Aortic arch: Aortic atherosclerosis. No aneurysm or dissection. ?Branching pattern is normal without origin stenosis. ? ?Right carotid system: Common carotid artery is widely patent to the ?bifurcation. There is calcified plaque at the carotid bifurcation ?and ICA bulb. There is ulcerated plaque at the ICA bulb which could ?be a source of emboli. Minimal diameter of the true lumen just ?distal to the ulceration shows a diameter of 1.2 mm. Compared to a ?more distal cervical ICA diameter of 4 mm, this indicates a 70% ?stenosis. ICA is widely patent beyond that. ? ?Left carotid system: Common carotid artery widely patent to the ?bifurcation. Calcified plaque at the carotid bifurcation and soft ?and calcified plaque at the ICA bulb. Minimal stenosis of the distal ?bulb shows a diameter of 3.2 mm. Compared to a more distal  cervical ?ICA diameter of the same, there is no stenosis. There is mild ?irregularity. ? ?Vertebral arteries: Right vertebral artery is dominant. No stenosis ?at the right vertebral artery origin. The non domin

## 2021-06-18 NOTE — Assessment & Plan Note (Signed)
He has undergone a CT angiogram of the carotids which I have independently reviewed.  The official report is of a 70% right ICA stenosis and mild left ICA stenosis.  I believe that the degree of stenosis is significantly worse than 70%, but more concerning is the very large ulceration that is present with the high-grade right carotid artery stenosis. ?His anatomy seems reasonably good for carotid stenting which would be significantly preferred due to his previous history of head and neck radiation.  The lesion is on the higher side but not prohibitively high.  I have discussed the risk and benefits of carotid stenting in detail with the patient.  He is agreeable to proceed.  This will be scheduled in the near future at his convenience.  Prescription for Plavix has been given and should be started 5 days prior to his carotid stent placement. ?

## 2021-06-18 NOTE — Progress Notes (Signed)
? ? ?MRN : [id number] ? ?Blake [name] is a 76 y.o. (September 10, 1945) male who presents with chief complaint of  ?Chief Complaint  ?Patient presents with  ? Follow-up  ?. ? ?History of Present Illness: Patient returns today in follow up of his carotid disease.  He is doing well today without any new complaints or problems.  He has undergone a CT angiogram of the carotids which I have independently reviewed.  The official report is of a 70% right ICA stenosis and mild left ICA stenosis.  I believe that the degree of stenosis is significantly worse than 70%, but more concerning is the very large ulceration that is present with the high-grade right carotid artery stenosis.  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  He also had chemotherapy and surgical resection for esophageal cancer a couple of years ago and had damage to his right lung at that time. ? ?Current Outpatient Medications  ?Medication Sig Dispense Refill  ? clopidogrel (PLAVIX) 75 MG tablet Take 1 tablet (75 mg total) by mouth daily. 30 tablet 6  ? lisinopril (ZESTRIL) 10 MG tablet TAKE 1 TABLET (10 MG TOTAL) BY MOUTH DAILY. 30 tablet 3  ? methocarbamol (ROBAXIN) 500 MG tablet Take 1 and 1/2 tablets (750 mg total) by mouth every 6 (six) hours as needed for muscle spasms. 90 tablet 1  ? metoprolol succinate (TOPROL-XL) 100 MG 24 hr tablet Take 1.5 tablets (150 mg total) by mouth once daily 135 tablet 1  ? omeprazole (PRILOSEC) 40 MG capsule take one capsule by mouth once daily 90 capsule 3  ? tamsulosin (FLOMAX) 0.4 MG CAPS capsule Take 1 capsule (0.4 mg total) by mouth once daily. TAKE 30 MINUTES AFTER SAME MEAL EACH DAY 90 capsule 3  ? acetaminophen (TYLENOL) 325 MG tablet Take 2 tablets (650 mg total) by mouth every 4 (four) hours as needed for mild pain ((score 1 to 3) or temp > 100.5). (Patient not taking: Reported on 10/29/2020) 60 tablet 0  ? amiodarone (PACERONE) 200 MG tablet TAKE 1 TABLET BY  MOUTH ONCE DAILY (Patient not taking: Reported on 04/30/2021) 90 tablet 1  ? amiodarone (PACERONE) 200 MG tablet Take 1 tablet (200 mg total) by mouth once daily (Patient not taking: Reported on 01/28/2021) 90 tablet 1  ? atorvastatin (LIPITOR) 80 MG tablet TAKE 1 TABLET BY MOUTH ONCE DAILY 90 tablet 3  ? furosemide (LASIX) 20 MG tablet TAKE 1 TABLET (20 MG TOTAL) BY MOUTH DAILY. (Patient not taking: Reported on 10/29/2020) 30 tablet 2  ? gabapentin (NEURONTIN) 300 MG capsule TAKE 1 CAPSULE (300 MG TOTAL) BY MOUTH 3 (THREE) TIMES DAILY (Patient not taking: No sig reported) 90 capsule 11  ? hydrocortisone 2.5 % lotion For seborrheic dermatitis. Apply to affected area on face every Monday, Wednesday, and Friday. (Patient not taking: Reported on 10/29/2020) 59 mL 3  ? metFORMIN (GLUCOPHAGE) 500 MG tablet TAKE 1 TABLET BY MOUTH TWICE DAILY WITH MEALS (Patient not taking: Reported on 04/30/2021) 60 tablet 11  ? metoprolol succinate (TOPROL-XL) 100 MG 24 hr tablet Take 100 mg by mouth daily. (Patient not taking: Reported on 06/17/2021)    ? metoprolol succinate (TOPROL-XL) 100 MG 24 hr tablet Take one tablet by mouth once daily (Patient not taking: Reported on 01/28/2021) 90 tablet 1  ? mirtazapine (REMERON) 15 MG tablet Take 1 tablet (15 mg total) by mouth nightly (Patient not taking: Reported on 01/28/2021) 30  tablet 11  ? omeprazole (PRILOSEC) 40 MG capsule Take 40 mg by mouth 2 (two) times daily. (Patient not taking: Reported on 10/29/2020)  3  ? oxyCODONE (OXY IR/ROXICODONE) 5 MG immediate release tablet Take 1-2 tablets (5-10 mg total) by mouth every 4 (four) hours as needed for moderate pain (5 mg for pain 4-6/10, 10 mg for 7-10/10). (Patient not taking: Reported on 10/29/2020) 60 tablet 0  ? promethazine (PHENERGAN) 25 MG tablet Take 1 tablet (25 mg total) by mouth every 6 (six) hours as needed. (Patient not taking: Reported on 04/30/2021) 60 tablet 1  ? senna-docusate (SENOKOT-S) 8.6-50 MG tablet Take 1 tablet by mouth 2  (two) times daily. (Patient not taking: Reported on 10/29/2020) 60 tablet 0  ? tamsulosin (FLOMAX) 0.4 MG CAPS capsule TAKE 1 CAPSULE BY MOUTH ONCE DAILY. TAKE 30 MINUTES AFTER SAME MEAL EACH DAY. (Patient not taking: Reported on 10/29/2020) 90 capsule 3  ? ?No current facility-administered medications for this visit.  ? ? ?Past Medical History:  ?Diagnosis Date  ? Arthritis   ? Back pain   ? with leg pain  ? Basal cell carcinoma 09/04/2009  ? R cheek 5.5 cm ant to earlobe - 12/26/2009 excision  ? Basal cell carcinoma 09/04/2009  ? R ant nasal alar rim  ? Basal cell carcinoma 07/31/2014  ? R mid brow  ? Basal cell carcinoma 07/30/2016  ? R ant nasal alar rim at ant edge of BCC scar  ? Basal cell carcinoma 11/02/2019  ? L zygoma   ? Cancer of base of tongue ([place]) 2006  ? s/p chemoradiation  ? 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, bilateral   ? Dyspnea   ? Dysrhythmia   ? Esophageal cancer ([place]) 2021  ? GERD (gastroesophageal reflux disease)   ? Hyperlipidemia   ? Hypertension   ? Numbness and tingling of both lower extremities   ? with positioning  ? Paroxysmal atrial fibrillation ([facility])   ? Port-A-Cath in place 2006  ? Prostate enlargement   ? 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 L pretibial - ED&C   ? T2DM (type 2 diabetes mellitus) ([place])   ? ? ?Past Surgical History:  ?Procedure Laterality Date  ? ABDOMINAL SURGERY    ? COLONOSCOPY  12/08/2003  ? COLONOSCOPY WITH PROPOFOL N/A 03/21/2019  ? Procedure: COLONOSCOPY WITH PROPOFOL;  Surgeon: Toledo, Benay Pike, MD;  Location: [facility];  Service: Gastroenterology;  Laterality: N/A;  ? ESOPHAGOGASTRODUODENOSCOPY (EGD) WITH PROPOFOL N/A 03/21/2019  ? Procedure: ESOPHAGOGASTRODUODENOSCOPY (EGD) WITH PROPOFOL;  Surgeon: Toledo, Benay Pike, MD;  Location: [facility];  Service: Gastroenterology;  Laterality: N/A;  ? esophogeal cancer    ? JOINT REPLACEMENT Right   ? total hip  ? LUMBAR LAMINECTOMY/DECOMPRESSION MICRODISCECTOMY N/A 03/03/2018  ?  Procedure: LUMBAR LAMINECTOMY/DECOMPRESSION MICRODISCECTOMY 1 LEVEL-L3-4,L4-5;  Surgeon: Meade Maw, MD;  Location: [facility];  Service: Neurosurgery;  Laterality: N/A;  ? LUMBAR LAMINECTOMY/DECOMPRESSION MICRODISCECTOMY N/A 02/24/2020  ? Procedure: LEFT L3-4 MICRODISCECTOMY, L4-5 DECOMPRESSION;  Surgeon: Meade Maw, MD;  Location: [facility];  Service: Neurosurgery;  Laterality: N/A;  ? MAXIMUM ACCESS (MAS) TRANSFORAMINAL LUMBAR INTERBODY FUSION (TLIF) 1 LEVEL N/A 07/13/2020  ? Procedure: OPEN L3-4 TRANSFORAMINAL LUMBAR INTERBODY FUSION (TLIF);  Surgeon: Meade Maw, MD;  Location: [facility];  Service: Neurosurgery;  Laterality: N/A;  ? PARTIAL HIP ARTHROPLASTY Right 2014  ? PORT-A-CATH REMOVAL    ? PORTACATH PLACEMENT Left 01/12/2020  ? Procedure: INSERTION PORT-A-CATH, with ultrasound fluoroscopy;  Surgeon: Nestor Lewandowsky, MD;  Location: [facility];  Service: General;  Laterality: Left;  ? TONGUE BIOPSY  2006  ? TONSILLECTOMY    ? TRIGGER FINGER RELEASE Right 2013  ? UPPER GI ENDOSCOPY  01/25/14  ? multiple gastric polyps  ? ? ? ?Social History  ? ?Tobacco Use  ? Smoking status: Former  ?  Packs/day: 1.00  ?  Years: 30.00  ?  Pack years: 30.00  ?  Types: Cigarettes  ?  Quit date: 02/18/1994  ?  Years since quitting: 27.3  ? Smokeless tobacco: Never  ?Vaping Use  ? Vaping Use: Never used  ?Substance Use Topics  ? Alcohol use: Not Currently  ? Drug use: No  ? ? ? ? ?Family History  ?Problem Relation Age of Onset  ? Diabetes Mother   ? Congestive Heart Failure Mother   ? Breast cancer Mother   ? Lung cancer Father   ? ? ?Allergies  ?Allergen Reactions  ? Sulfa Antibiotics Rash  ? ? ?REVIEW OF SYSTEMS (Negative unless checked) ?  ?Constitutional: '[x]'$ Weight loss  '[]'$ Fever  '[]'$ Chills ?Cardiac: '[]'$ Chest pain   '[]'$ Chest pressure   '[]'$ Palpitations   '[]'$ Shortness of breath when laying flat   '[]'$ Shortness of breath at rest   '[]'$ Shortness of breath with exertion. ?Vascular:  '[]'$ Pain in legs with walking   '[]'$ Pain in  legs at rest   '[]'$ Pain in legs when laying flat   '[]'$ Claudication   '[]'$ Pain in feet when walking  '[]'$ Pain in feet at rest  '[]'$ Pain in feet when laying flat   '[]'$ History of DVT   '[]'$ Phlebitis   '[]'$ Swelling in legs   '[]'$ Varic

## 2021-06-18 NOTE — H&P (View-Only) (Signed)
? ? ?MRN : [id number] ? ?Blake [name] is a 76 y.o. ([birth year]) male who presents with chief complaint of  ?Chief Complaint  ?Patient presents with  ? Follow-up  ?. ? ?History of Present Illness: Patient returns today in follow up of his carotid disease.  He is doing well today without any new complaints or problems.  He has undergone a CT angiogram of the carotids which I have independently reviewed.  The official report is of a 70% right ICA stenosis and mild left ICA stenosis.  I believe that the degree of stenosis is significantly worse than 70%, but more concerning is the very large ulceration that is present with the high-grade right carotid artery stenosis.  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  He also had chemotherapy and surgical resection for esophageal cancer a couple of years ago and had damage to his right lung at that time. ? ?Current Outpatient Medications  ?Medication Sig Dispense Refill  ? clopidogrel (PLAVIX) 75 MG tablet Take 1 tablet (75 mg total) by mouth daily. 30 tablet 6  ? lisinopril (ZESTRIL) 10 MG tablet TAKE 1 TABLET (10 MG TOTAL) BY MOUTH DAILY. 30 tablet 3  ? methocarbamol (ROBAXIN) 500 MG tablet Take 1 and 1/2 tablets (750 mg total) by mouth every 6 (six) hours as needed for muscle spasms. 90 tablet 1  ? metoprolol succinate (TOPROL-XL) 100 MG 24 hr tablet Take 1.5 tablets (150 mg total) by mouth once daily 135 tablet 1  ? omeprazole (PRILOSEC) 40 MG capsule take one capsule by mouth once daily 90 capsule 3  ? tamsulosin (FLOMAX) 0.4 MG CAPS capsule Take 1 capsule (0.4 mg total) by mouth once daily. TAKE 30 MINUTES AFTER SAME MEAL EACH DAY 90 capsule 3  ? acetaminophen (TYLENOL) 325 MG tablet Take 2 tablets (650 mg total) by mouth every 4 (four) hours as needed for mild pain ((score 1 to 3) or temp > 100.5). (Patient not taking: Reported on 10/29/2020) 60 tablet 0  ? amiodarone (PACERONE) 200 MG tablet TAKE 1 TABLET BY  MOUTH ONCE DAILY (Patient not taking: Reported on 04/30/2021) 90 tablet 1  ? amiodarone (PACERONE) 200 MG tablet Take 1 tablet (200 mg total) by mouth once daily (Patient not taking: Reported on 01/28/2021) 90 tablet 1  ? atorvastatin (LIPITOR) 80 MG tablet TAKE 1 TABLET BY MOUTH ONCE DAILY 90 tablet 3  ? furosemide (LASIX) 20 MG tablet TAKE 1 TABLET (20 MG TOTAL) BY MOUTH DAILY. (Patient not taking: Reported on 10/29/2020) 30 tablet 2  ? gabapentin (NEURONTIN) 300 MG capsule TAKE 1 CAPSULE (300 MG TOTAL) BY MOUTH 3 (THREE) TIMES DAILY (Patient not taking: No sig reported) 90 capsule 11  ? hydrocortisone 2.5 % lotion For seborrheic dermatitis. Apply to affected area on face every Monday, Wednesday, and Friday. (Patient not taking: Reported on 10/29/2020) 59 mL 3  ? metFORMIN (GLUCOPHAGE) 500 MG tablet TAKE 1 TABLET BY MOUTH TWICE DAILY WITH MEALS (Patient not taking: Reported on 04/30/2021) 60 tablet 11  ? metoprolol succinate (TOPROL-XL) 100 MG 24 hr tablet Take 100 mg by mouth daily. (Patient not taking: Reported on 06/17/2021)    ? metoprolol succinate (TOPROL-XL) 100 MG 24 hr tablet Take one tablet by mouth once daily (Patient not taking: Reported on 01/28/2021) 90 tablet 1  ? mirtazapine (REMERON) 15 MG tablet Take 1 tablet (15 mg total) by mouth nightly (Patient not taking: Reported on 01/28/2021) 30  tablet 11  ? omeprazole (PRILOSEC) 40 MG capsule Take 40 mg by mouth 2 (two) times daily. (Patient not taking: Reported on 10/29/2020)  3  ? oxyCODONE (OXY IR/ROXICODONE) 5 MG immediate release tablet Take 1-2 tablets (5-10 mg total) by mouth every 4 (four) hours as needed for moderate pain (5 mg for pain 4-6/10, 10 mg for 7-10/10). (Patient not taking: Reported on 10/29/2020) 60 tablet 0  ? promethazine (PHENERGAN) 25 MG tablet Take 1 tablet (25 mg total) by mouth every 6 (six) hours as needed. (Patient not taking: Reported on 04/30/2021) 60 tablet 1  ? senna-docusate (SENOKOT-S) 8.6-50 MG tablet Take 1 tablet by mouth 2  (two) times daily. (Patient not taking: Reported on 10/29/2020) 60 tablet 0  ? tamsulosin (FLOMAX) 0.4 MG CAPS capsule TAKE 1 CAPSULE BY MOUTH ONCE DAILY. TAKE 30 MINUTES AFTER SAME MEAL EACH DAY. (Patient not taking: Reported on 10/29/2020) 90 capsule 3  ? ?No current facility-administered medications for this visit.  ? ? ?Past Medical History:  ?Diagnosis Date  ? Arthritis   ? Back pain   ? with leg pain  ? Basal cell carcinoma 09/04/2009  ? R cheek 5.5 cm ant to earlobe - 12/26/2009 excision  ? Basal cell carcinoma 09/04/2009  ? R ant nasal alar rim  ? Basal cell carcinoma 07/31/2014  ? R mid brow  ? Basal cell carcinoma 07/30/2016  ? R ant nasal alar rim at ant edge of BCC scar  ? Basal cell carcinoma 11/02/2019  ? L zygoma   ? 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 s/p chemoradiation  ? 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, bilateral   ? Dyspnea   ? Dysrhythmia   ? Esophageal cancer ([place]) 2021  ? GERD (gastroesophageal reflux disease)   ? Hyperlipidemia   ? Hypertension   ? Numbness and tingling of both lower extremities   ? with positioning  ? Paroxysmal atrial fibrillation (HCC)   ? Port-A-Cath in place 2006  ? Prostate enlargement   ? 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 L pretibial - ED&C   ? T2DM (type 2 diabetes mellitus) ([place])   ? ? ?Past Surgical History:  ?Procedure Laterality Date  ? ABDOMINAL SURGERY    ? COLONOSCOPY  12/08/2003  ? COLONOSCOPY WITH PROPOFOL N/A 03/21/2019  ? Procedure: COLONOSCOPY WITH PROPOFOL;  Surgeon: Toledo, Benay Pike, MD;  Location: [facility];  Service: Gastroenterology;  Laterality: N/A;  ? ESOPHAGOGASTRODUODENOSCOPY (EGD) WITH PROPOFOL N/A 03/21/2019  ? Procedure: ESOPHAGOGASTRODUODENOSCOPY (EGD) WITH PROPOFOL;  Surgeon: Toledo, Benay Pike, MD;  Location: [facility] END[facility];  Service: Gastroenterology;  Laterality: N/A;  ? esophogeal cancer    ? JOINT REPLACEMENT Right   ? total hip  ? LUMBAR LAMINECTOMY/DECOMPRESSION MICRODISCECTOMY N/A 03/03/2018  ?  Procedure: LUMBAR LAMINECTOMY/DECOMPRESSION MICRODISCECTOMY 1 LEVEL-L3-4,L4-5;  Surgeon: Meade Maw, MD;  Location: [facility] ORS;  Service: Neurosurgery;  Laterality: N/A;  ? LUMBAR LAMINECTOMY/DECOMPRESSION MICRODISCECTOMY N/A 02/24/2020  ? Procedure: LEFT L3-4 MICRODISCECTOMY, L4-5 DECOMPRESSION;  Surgeon: Meade Maw, MD;  Location: [facility] ORS;  Service: Neurosurgery;  Laterality: N/A;  ? MAXIMUM ACCESS (MAS) TRANSFORAMINAL LUMBAR INTERBODY FUSION (TLIF) 1 LEVEL N/A 07/13/2020  ? Procedure: OPEN L3-4 TRANSFORAMINAL LUMBAR INTERBODY FUSION (TLIF);  Surgeon: Meade Maw, MD;  Location: [facility] ORS;  Service: Neurosurgery;  Laterality: N/A;  ? PARTIAL HIP ARTHROPLASTY Right 2014  ? PORT-A-CATH REMOVAL    ? PORTACATH PLACEMENT Left 01/12/2020  ? Procedure: INSERTION PORT-A-CATH, with ultrasound fluoroscopy;  Surgeon: Nestor Lewandowsky, MD;  Location: [facility] ORS;  Service: General;  Laterality: Left;  ? TONGUE BIOPSY  2006  ? TONSILLECTOMY    ? TRIGGER FINGER RELEASE Right 2013  ? UPPER GI ENDOSCOPY  01/25/14  ? multiple gastric polyps  ? ? ? ?Social History  ? ?Tobacco Use  ? Smoking status: Former  ?  Packs/day: 1.00  ?  Years: 30.00  ?  Pack years: 30.00  ?  Types: Cigarettes  ?  Quit date: 02/18/1994  ?  Years since quitting: 27.3  ? Smokeless tobacco: Never  ?Vaping Use  ? Vaping Use: Never used  ?Substance Use Topics  ? Alcohol use: Not Currently  ? Drug use: No  ? ? ? ? ?Family History  ?Problem Relation Age of Onset  ? Diabetes Mother   ? Congestive Heart Failure Mother   ? Breast cancer Mother   ? Lung cancer Father   ? ? ?Allergies  ?Allergen Reactions  ? Sulfa Antibiotics Rash  ? ? ?REVIEW OF SYSTEMS (Negative unless checked) ?  ?Constitutional: '[x]'$ Weight loss  '[]'$ Fever  '[]'$ Chills ?Cardiac: '[]'$ Chest pain   '[]'$ Chest pressure   '[]'$ Palpitations   '[]'$ Shortness of breath when laying flat   '[]'$ Shortness of breath at rest   '[]'$ Shortness of breath with exertion. ?Vascular:  '[]'$ Pain in legs with walking   '[]'$ Pain in  legs at rest   '[]'$ Pain in legs when laying flat   '[]'$ Claudication   '[]'$ Pain in feet when walking  '[]'$ Pain in feet at rest  '[]'$ Pain in feet when laying flat   '[]'$ History of DVT   '[]'$ Phlebitis   '[]'$ Swelling in legs   '[]'$ Varic

## 2021-06-19 ENCOUNTER — Telehealth (INDEPENDENT_AMBULATORY_CARE_PROVIDER_SITE_OTHER): Payer: Self-pay

## 2021-06-19 NOTE — Telephone Encounter (Signed)
Spoke with the patient to schedule him for a right carotid stent placement with Dr. Lucky Cowboy for next week per Dr. Lucky Cowboy. Patient was offered 06/24/21-declined, 06/27/21-declined, 07/01/21-declined and accepted 07/04/21 with a 6:45 am arrival time to the [facility]. Pre-procedure instructions were discussed and will be mailed. ?

## 2021-06-19 NOTE — Patient Instructions (Signed)

## 2021-06-24 ENCOUNTER — Other Ambulatory Visit: Payer: Self-pay

## 2021-06-24 DIAGNOSIS — E782 Mixed hyperlipidemia: Secondary | ICD-10-CM | POA: Diagnosis not present

## 2021-06-24 DIAGNOSIS — I10 Essential (primary) hypertension: Secondary | ICD-10-CM | POA: Diagnosis not present

## 2021-06-24 DIAGNOSIS — I6523 Occlusion and stenosis of bilateral carotid arteries: Secondary | ICD-10-CM | POA: Diagnosis not present

## 2021-06-24 DIAGNOSIS — I480 Paroxysmal atrial fibrillation: Secondary | ICD-10-CM | POA: Diagnosis not present

## 2021-06-24 DIAGNOSIS — I25118 Atherosclerotic heart disease of native coronary artery with other forms of angina pectoris: Secondary | ICD-10-CM | POA: Diagnosis not present

## 2021-06-24 MED ORDER — METHOCARBAMOL 500 MG PO TABS
750.0000 mg | ORAL_TABLET | Freq: Four times a day (QID) | ORAL | 1 refills | Status: DC | PRN
  Filled 2021-06-24: qty 90, 15d supply, fill #0

## 2021-06-25 ENCOUNTER — Other Ambulatory Visit: Payer: Self-pay

## 2021-06-28 ENCOUNTER — Inpatient Hospital Stay: Payer: Medicare HMO | Attending: Oncology

## 2021-06-28 ENCOUNTER — Ambulatory Visit
Admission: RE | Admit: 2021-06-28 | Discharge: 2021-06-28 | Disposition: A | Discharge status: Home / Self Care | Payer: Medicare HMO | Source: Ambulatory Visit | Attending: Oncology | Admitting: Oncology

## 2021-06-28 DIAGNOSIS — Z85810 Personal history of malignant neoplasm of tongue: Secondary | ICD-10-CM | POA: Diagnosis not present

## 2021-06-28 DIAGNOSIS — C159 Malignant neoplasm of esophagus, unspecified: Secondary | ICD-10-CM | POA: Diagnosis not present

## 2021-06-28 DIAGNOSIS — J439 Emphysema, unspecified: Secondary | ICD-10-CM | POA: Diagnosis not present

## 2021-06-28 DIAGNOSIS — I2510 Atherosclerotic heart disease of native coronary artery without angina pectoris: Secondary | ICD-10-CM | POA: Diagnosis not present

## 2021-06-28 DIAGNOSIS — I700 Atherosclerosis of aorta: Secondary | ICD-10-CM | POA: Diagnosis not present

## 2021-06-28 DIAGNOSIS — K639 Disease of intestine, unspecified: Secondary | ICD-10-CM | POA: Diagnosis not present

## 2021-06-28 LAB — COMPREHENSIVE METABOLIC PANEL
ALT: 15 U/L (ref 0–44)
AST: 18 U/L (ref 15–41)
Albumin: 4 g/dL (ref 3.5–5.0)
Alkaline Phosphatase: 78 U/L (ref 38–126)
Anion gap: 6 (ref 5–15)
BUN: 17 mg/dL (ref 8–23)
CO2: 28 mmol/L (ref 22–32)
Calcium: 8.9 mg/dL (ref 8.9–10.3)
Chloride: 102 mmol/L (ref 98–111)
Creatinine, Ser: 1.02 mg/dL (ref 0.61–1.24)
GFR, Estimated: >60 mL/min (ref >60)
Glucose, Bld: 154 mg/dL — ABNORMAL HIGH (ref 70–99)
Potassium: 4.4 mmol/L (ref 3.5–5.1)
Sodium: 136 mmol/L (ref 135–145)
Total Bilirubin: 0.3 mg/dL (ref 0.3–1.2)
Total Protein: 6.6 g/dL (ref 6.5–8.1)

## 2021-06-28 LAB — CBC WITH DIFFERENTIAL/PLATELET
Abs Immature Granulocytes: 0.02 10*3/uL (ref 0.00–0.07)
Basophils Absolute: 0.1 10*3/uL (ref 0.0–0.1)
Basophils Relative: 1 %
Eosinophils Absolute: 0.2 10*3/uL (ref 0.0–0.5)
Eosinophils Relative: 3 %
HCT: 45.4 % (ref 39.0–52.0)
Hemoglobin: 13.8 g/dL (ref 13.0–17.0)
Immature Granulocytes: 0 %
Lymphocytes Relative: 16 %
Lymphs Abs: 1.2 10*3/uL (ref 0.7–4.0)
MCH: 24.3 pg — ABNORMAL LOW (ref 26.0–34.0)
MCHC: 30.4 g/dL (ref 30.0–36.0)
MCV: 79.9 fL — ABNORMAL LOW (ref 80.0–100.0)
Monocytes Absolute: 0.6 10*3/uL (ref 0.1–1.0)
Monocytes Relative: 8 %
Neutro Abs: 5.5 10*3/uL (ref 1.7–7.7)
Neutrophils Relative %: 72 %
Platelets: 274 10*3/uL (ref 150–400)
RBC: 5.68 MIL/uL (ref 4.22–5.81)
RDW: 18.2 % — ABNORMAL HIGH (ref 11.5–15.5)
WBC: 7.6 10*3/uL (ref 4.0–10.5)
nRBC: 0 % (ref 0.0–0.2)

## 2021-06-28 MED ORDER — IOHEXOL 300 MG/ML  SOLN
100.0000 mL | Freq: Once | INTRAMUSCULAR | Status: AC | PRN
  Administered 2021-06-28: 100 mL via INTRAVENOUS

## 2021-06-28 MED ORDER — HEPARIN SOD (PORK) LOCK FLUSH 100 UNIT/ML IV SOLN
500.0000 [IU] | Freq: Once | INTRAVENOUS | Status: AC
Start: 2021-06-28 — End: 2021-06-28
  Administered 2021-06-28: 500 [IU] via INTRAVENOUS
  Filled 2021-06-28: qty 5

## 2021-06-28 MED ORDER — HEPARIN SOD (PORK) LOCK FLUSH 100 UNIT/ML IV SOLN
INTRAVENOUS | Status: AC
  Filled 2021-06-28: qty 5

## 2021-06-29 LAB — CEA: CEA: 3.3 ng/mL (ref 0.0–4.7)

## 2021-07-01 ENCOUNTER — Inpatient Hospital Stay: Payer: Medicare HMO | Attending: Oncology | Admitting: Oncology

## 2021-07-01 ENCOUNTER — Encounter: Payer: Self-pay | Admitting: Oncology

## 2021-07-01 VITALS — BP 174/61 | HR 61 | Temp 96.0°F | Wt 151.0 lb

## 2021-07-01 DIAGNOSIS — Z95828 Presence of other vascular implants and grafts: Secondary | ICD-10-CM | POA: Diagnosis not present

## 2021-07-01 DIAGNOSIS — R918 Other nonspecific abnormal finding of lung field: Secondary | ICD-10-CM | POA: Diagnosis not present

## 2021-07-01 DIAGNOSIS — C155 Malignant neoplasm of lower third of esophagus: Secondary | ICD-10-CM | POA: Insufficient documentation

## 2021-07-01 DIAGNOSIS — C159 Malignant neoplasm of esophagus, unspecified: Secondary | ICD-10-CM | POA: Diagnosis not present

## 2021-07-01 NOTE — Progress Notes (Signed)
?Hematology/Oncology Progress note ?[phone] [phone] ?  ? ? ? ?Patient Care Team: ?Idelle Crouch, MD as PCP - General (Internal Medicine) ?Christene Lye, MD (General Surgery) ?Clent Jacks, RN as Oncology Nurse Navigator ?Earlie Server, MD as Consulting Physician (Hematology and Oncology) ? ?REFERRING PROVIDER: ?Idelle Crouch, MD  ?CHIEF COMPLAINTS/REASON FOR VISIT:  ?Follow up  esophageal cancer ? ?HISTORY OF PRESENTING ILLNESS:  ? ?Blake [name] is a  76 y.o.  male with PMH listed below was seen in consultation at the request of  Doy Hutching Leonie Douglas, MD  for evaluation of esophageal cancer ?Patient has longstanding Barrett's esophagus. ?03/21/2019 patient underwent surveillance endoscopy which showed esophageal mucosal changes secondary to long segment Barrett's disease present in the lower third of the esophagus.  Maximal longitudinal extent of these mucosal changes were 7 cm in length.  Mucosa was biopsied with cold forceps for histology in 4 quadrants at the interval of 2 cm in the lower third of esophagus.  2 cm hiatal hernia, gastritis. ?03/21/2019 colonoscopy showed diverticulosis in the sigmoid colon.  4 mm polyp in ascending colon was removed. ?Esophageal biopsy 3 out of the 4 biopsies were positive for intramucosal carcinoma involving Barrett's mucosa with high-grade dysplasia.  1 out of 4 esophagus biopsy showed Barrett's mucosa with erosion, indefinite for s. ?Colon polyp biopsy showed tubular adenoma. ?Patient was referred to cancer center for further evaluation and management. ? ?04/06/2019 CT chest abdomen pelvis showed no evidence of metastatic disease in the chest abdomen or pelvis.  No esophageal mass lesion evident.  Small to moderate hiatal hernia with possible wall thickening proximal stomach.  3 mm left lower lobe pulmonary nodule attention on follow-up recommended to ensure stability.  Tiny hypodensities in the left kidney with attenuation too high to  be simple cyst. ? 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 Jeb Levering.  His initial PET scan 11/08/2004 showed abnormal localization in a right neck mass possibly a lymph node, slightly asymmetric localization at the base of the tongue.  Pathology is not available in current EMR. ?  ?#06/09/19, EMR at [facility].  ?Pathology showed adenocarcinoma of the esophagus, intramucosal at least. EMR specimen was received in multiple fragments and margins can not be evaluated.  ?1 gastric polyps were biopsied which showed fundic gland polyp. Negative for dysplasia ? ?#08/08/19, to OR for EGD with sessile tumor from 29-33cm, at least 1.5 cm wide. Pathology with the following: 1) esophagus (30cm) - residual invasive adenocarcinoma, at least invasive to muscularis mucosa and 2) esophagus (30cm) - superficial fragments of highly dysplastic epithelium, focally suspicious for intramucosal carcinoma ? ?#  ?09/12/2019, patient underwent esophagectomy with pathological revealed  ?Negative esophageal margin, gastric margin, esophagogastrectomy, invasive moderately differentiated esophageal adenocarcinoma involved distal esophageal and GE junction, tumor focally invades the submucosa.  Left gastric lymph node x 4 negative, greater curvature lymph nodes 1/3 is positive for 69m metastatic carcinoma.  Periesophageal lymph node x 6, negative, final pathology stage pT1b pn1, Esophageal adenocarcinoma, Stage IIB ?Lymph node positive diease, no neoadjuvant chemotherapy, recommended by  Dr. UFanny Skatesand was recommended for adjuvant chemotherapy.  Patient prefers to receive treatment closer to home and patient call back to establish care. No radiation was recommended due to the concern of dehiscence. ?11/28/2019, patient had a stent removal. ? ?Adjuvant chemotherapy therapy ?September 2021, patient underwent Xeloda 1500 mg twice daily/oxaliplatin.  He  reports not tolerating well due to nausea and diarrhea. ?12/24/2019 patient's wife was diagnosed with COVID-19 infection and patient was tested negative.  Both patient and wife received monoclonal antibody treatments.  Patient prefers to switch to FOLFOX. ?He had Mediport placed on 01/12/2020 by Dr. Faith Rogue. Additional chemotherapy was interrupted and delayed due to his aspiration pneumonia, and later due to his preference of proceeding with back surgery first- see below. ?He had x-ray prior and after this procedure.  Post x-ray showed extensive patchy consolidation throughout the right lung and a mild patchy opacity in the upper left lung.  Worsened.  Patient also reports having cough, as well as some chronic shortness of breath ever since his surgery at [facility] in June 2021. ?01/17/2020, stat CT chest with contrast showed extensive nodular airspace process in the right lung with loss of volume and areas of subpleural atelectasis.  Likely polylobar pneumonia with aspiration.  No mediastinal or hilar mass or adenopathy.  Surgical changes ?Patient was advised to go to emergency room and was later admitted for pneumonia treatments.  There was concern of aspiration pneumonia.  Patient was evaluated by speech therapy.  No overt clinical signs of aspiration during p.o. intake.  Cannot rule out possibility of esophageal phase retrograde flow activity patient denies further swallowing evaluation.  Patient was discharged on 01/19/2020. ?02/10/2020 patient was seen by [facility] clinic gastroenterology.  Was recommended and no indication of repeat swallowing evaluation at present time. ?02/24/2020, patient had L4-5 lumbar decompression, L3--4 microdiscectomy.  Per note he tolerates procedure very well. ?djuvant chemotherapy has been delayed significantly due to above reasons. ?At that point, the benefit is uncertain to resume adjuvant chemotherapy 9 months after original surgery. ?04/10/2020, PET scan showed progressive right lung  area of dense consolidation and clustering nodularity with correlated hypermetabolic him.  Favor aspiration/chronic infection.  No signs of metastatic disease.  Decision was made to continue observation  ? ?Patient and his wife traveled in February 2022. ?Feb/March Covid pneumonia admission during his trip. ?06/26/2020- 06/27/2020 admission due to chest pain. Toponin was negative, CXR negative.CT chest angiogram showed np PE, multifocal pneumonia through out right lung, improved. Increasing moderate left pleural effusion. Unchanged trace right pleural effusion. ?07/12/2020 CT abdomen pelvis with contrast - no findings of active malignancy. masslike structure in the left upper quadrant represents a confluent region of fat necrosis. Not hypermetabolic on previous PET. ?Nodular and bandlike opacities at right lung base have improved comparing to 06/26/2020. Substantial narrowing of right lowe lobe tracheobronchial tree. Other chronic findings. ?  ?07/16/2020 s/p spondylolisthesis. ?12/04/2020 CT chest abdomen pelvis showed no evidence of new or progressive findings to suggest recurrent or metastatic disease.  ?Stable volume loss right hemithorax with diffuse micro nodularity in the right lung. ? Atypical infection.  ?He declined pulmonology work up  ? ? ?INTERVAL HISTORY ?Blake [name] is a 76 y.o. male who has above history reviewed by me today presents for follow up visit for management of esophageal cancer.  ?Patient reports feeling well.  He has upcoming surgery for carotid artery. ?No new complaints. ? ?Review of Systems  ?Constitutional:  Negative for appetite change, chills, diaphoresis, fatigue, fever and unexpected weight change.  ?HENT:   Negative for hearing loss, lump/mass, nosebleeds, sore throat and voice change.   ?Eyes:  Negative for eye problems and icterus.  ?Respiratory:  Negative for chest tightness, cough, hemoptysis, shortness of breath and wheezing.   ?Cardiovascular:  Negative for chest pain and leg  swelling.  ?Gastrointestinal:  Negative for abdominal distention, abdominal pain, blood in stool, diarrhea, nausea and rectal pain.  ?Endocrine: Negative for hot flashes.  ?Genitourinary:  Negative for bladde

## 2021-07-02 ENCOUNTER — Telehealth: Payer: Self-pay

## 2021-07-02 DIAGNOSIS — I25118 Atherosclerotic heart disease of native coronary artery with other forms of angina pectoris: Secondary | ICD-10-CM | POA: Diagnosis not present

## 2021-07-02 NOTE — Telephone Encounter (Signed)
Called patient to inform of portflush q 8 weeks to be scheduled. No answer, left detailed message.  ? ? ?Please schedule patient for Port flush q 8 wks x6. Please inform patient of appt. Thanks  ?

## 2021-07-02 NOTE — Telephone Encounter (Signed)
Referral to pulmonology placed. Last encounter, demo, and insurance faxed over to Dr. Teodoro Kil office.  ?

## 2021-07-02 NOTE — Telephone Encounter (Signed)
-----   Message from Earlie Server, MD sent at 07/01/2021  8:14 PM EDT ----- ?I think he still has a port.  Please schedule him to have Mediport flush every 8 weeks x 6.  Last the flush was probably in February ? ?

## 2021-07-04 ENCOUNTER — Inpatient Hospital Stay
Admission: RE | Admit: 2021-07-04 | Discharge: 2021-07-05 | DRG: 035 | Disposition: A | Discharge status: Home / Self Care | Payer: Medicare HMO | Attending: Vascular Surgery | Admitting: Vascular Surgery

## 2021-07-04 ENCOUNTER — Encounter: Payer: Self-pay | Admitting: Vascular Surgery

## 2021-07-04 ENCOUNTER — Other Ambulatory Visit: Payer: Self-pay

## 2021-07-04 ENCOUNTER — Encounter
Admission: RE | Disposition: A | Discharge status: Home / Self Care | Payer: Self-pay | Source: Home / Self Care | Attending: Vascular Surgery

## 2021-07-04 DIAGNOSIS — I6521 Occlusion and stenosis of right carotid artery: Secondary | ICD-10-CM | POA: Diagnosis not present

## 2021-07-04 DIAGNOSIS — E440 Moderate protein-calorie malnutrition: Secondary | ICD-10-CM | POA: Diagnosis present

## 2021-07-04 DIAGNOSIS — Z9221 Personal history of antineoplastic chemotherapy: Secondary | ICD-10-CM | POA: Diagnosis not present

## 2021-07-04 DIAGNOSIS — Z8249 Family history of ischemic heart disease and other diseases of the circulatory system: Secondary | ICD-10-CM | POA: Diagnosis not present

## 2021-07-04 DIAGNOSIS — Z833 Family history of diabetes mellitus: Secondary | ICD-10-CM | POA: Diagnosis not present

## 2021-07-04 DIAGNOSIS — E1151 Type 2 diabetes mellitus with diabetic peripheral angiopathy without gangrene: Secondary | ICD-10-CM | POA: Diagnosis present

## 2021-07-04 DIAGNOSIS — Z006 Encounter for examination for normal comparison and control in clinical research program: Secondary | ICD-10-CM

## 2021-07-04 DIAGNOSIS — Z79899 Other long term (current) drug therapy: Secondary | ICD-10-CM | POA: Diagnosis not present

## 2021-07-04 DIAGNOSIS — Z803 Family history of malignant neoplasm of breast: Secondary | ICD-10-CM

## 2021-07-04 DIAGNOSIS — G6282 Radiation-induced polyneuropathy: Principal | ICD-10-CM | POA: Diagnosis present

## 2021-07-04 DIAGNOSIS — I6523 Occlusion and stenosis of bilateral carotid arteries: Secondary | ICD-10-CM | POA: Diagnosis present

## 2021-07-04 DIAGNOSIS — I772 Rupture of artery: Secondary | ICD-10-CM | POA: Diagnosis present

## 2021-07-04 DIAGNOSIS — I480 Paroxysmal atrial fibrillation: Secondary | ICD-10-CM | POA: Diagnosis present

## 2021-07-04 DIAGNOSIS — Z801 Family history of malignant neoplasm of trachea, bronchus and lung: Secondary | ICD-10-CM

## 2021-07-04 DIAGNOSIS — Z8589 Personal history of malignant neoplasm of other organs and systems: Secondary | ICD-10-CM

## 2021-07-04 DIAGNOSIS — Z8501 Personal history of malignant neoplasm of esophagus: Secondary | ICD-10-CM | POA: Diagnosis not present

## 2021-07-04 DIAGNOSIS — Z881 Allergy status to other antibiotic agents status: Secondary | ICD-10-CM | POA: Diagnosis not present

## 2021-07-04 DIAGNOSIS — Z7902 Long term (current) use of antithrombotics/antiplatelets: Secondary | ICD-10-CM | POA: Diagnosis not present

## 2021-07-04 DIAGNOSIS — E785 Hyperlipidemia, unspecified: Secondary | ICD-10-CM | POA: Diagnosis present

## 2021-07-04 DIAGNOSIS — Z87891 Personal history of nicotine dependence: Secondary | ICD-10-CM

## 2021-07-04 DIAGNOSIS — Z85828 Personal history of other malignant neoplasm of skin: Secondary | ICD-10-CM

## 2021-07-04 DIAGNOSIS — Z6824 Body mass index (BMI) 24.0-24.9, adult: Secondary | ICD-10-CM

## 2021-07-04 DIAGNOSIS — Z85810 Personal history of malignant neoplasm of tongue: Secondary | ICD-10-CM

## 2021-07-04 DIAGNOSIS — Z923 Personal history of irradiation: Secondary | ICD-10-CM | POA: Diagnosis not present

## 2021-07-04 DIAGNOSIS — N400 Enlarged prostate without lower urinary tract symptoms: Secondary | ICD-10-CM | POA: Diagnosis present

## 2021-07-04 DIAGNOSIS — Y842 Radiological procedure and radiotherapy as the cause of abnormal reaction of the patient, or of later complication, without mention of misadventure at the time of the procedure: Secondary | ICD-10-CM | POA: Diagnosis present

## 2021-07-04 DIAGNOSIS — Z96641 Presence of right artificial hip joint: Secondary | ICD-10-CM | POA: Diagnosis present

## 2021-07-04 DIAGNOSIS — K219 Gastro-esophageal reflux disease without esophagitis: Secondary | ICD-10-CM | POA: Diagnosis present

## 2021-07-04 DIAGNOSIS — I10 Essential (primary) hypertension: Secondary | ICD-10-CM | POA: Diagnosis present

## 2021-07-04 HISTORY — PX: CAROTID PTA/STENT INTERVENTION: CATH118231

## 2021-07-04 LAB — BUN: BUN: 20 mg/dL (ref 8–23)

## 2021-07-04 LAB — CREATININE, SERUM
Creatinine, Ser: 0.91 mg/dL (ref 0.61–1.24)
GFR, Estimated: >60 mL/min (ref >60)

## 2021-07-04 LAB — MRSA NEXT GEN BY PCR, NASAL: MRSA by PCR Next Gen: NOT DETECTED

## 2021-07-04 LAB — POCT ACTIVATED CLOTTING TIME: Activated Clotting Time: 269 seconds

## 2021-07-04 LAB — GLUCOSE, CAPILLARY: Glucose-Capillary: 120 mg/dL — ABNORMAL HIGH (ref 70–99)

## 2021-07-04 SURGERY — CAROTID PTA/STENT INTERVENTION
Anesthesia: Moderate Sedation | Laterality: Right

## 2021-07-04 MED ORDER — ACETAMINOPHEN 325 MG PO TABS: 325.0000 mg | ORAL_TABLET | ORAL | Status: DC | PRN

## 2021-07-04 MED ORDER — TAMSULOSIN HCL 0.4 MG PO CAPS: 0.4000 mg | ORAL_CAPSULE | Freq: Every day | ORAL | Status: DC

## 2021-07-04 MED ORDER — GUAIFENESIN-DM 100-10 MG/5ML PO SYRP: 15.0000 mL | ORAL_SOLUTION | ORAL | Status: DC | PRN

## 2021-07-04 MED ORDER — FENTANYL CITRATE (PF) 100 MCG/2ML IJ SOLN
INTRAMUSCULAR | Status: AC
  Filled 2021-07-04: qty 2

## 2021-07-04 MED ORDER — FAMOTIDINE IN NACL 20-0.9 MG/50ML-% IV SOLN
20.0000 mg | Freq: Two times a day (BID) | INTRAVENOUS | Status: DC
  Administered 2021-07-04: 20 mg via INTRAVENOUS
  Filled 2021-07-04 (×3): qty 50

## 2021-07-04 MED ORDER — PHENYLEPHRINE 40 MCG/ML (10ML) SYRINGE FOR IV PUSH (FOR BLOOD PRESSURE SUPPORT)
PREFILLED_SYRINGE | INTRAVENOUS | Status: AC
  Filled 2021-07-04: qty 10

## 2021-07-04 MED ORDER — MIDAZOLAM HCL 2 MG/ML PO SYRP: 8.0000 mg | ORAL_SOLUTION | Freq: Once | ORAL | Status: DC | PRN

## 2021-07-04 MED ORDER — ATROPINE SULFATE 1 MG/10ML IJ SOSY
PREFILLED_SYRINGE | INTRAMUSCULAR | Status: AC
  Filled 2021-07-04: qty 10

## 2021-07-04 MED ORDER — ALUM & MAG HYDROXIDE-SIMETH 200-200-20 MG/5ML PO SUSP: 15.0000 mL | ORAL | Status: DC | PRN

## 2021-07-04 MED ORDER — ATROPINE SULFATE 1 MG/10ML IJ SOSY
PREFILLED_SYRINGE | INTRAMUSCULAR | Status: DC | PRN
  Administered 2021-07-04: 1 mg via INTRAVENOUS

## 2021-07-04 MED ORDER — MIDAZOLAM HCL 2 MG/2ML IJ SOLN
INTRAMUSCULAR | Status: AC
  Filled 2021-07-04: qty 4

## 2021-07-04 MED ORDER — FAMOTIDINE 20 MG PO TABS: 40.0000 mg | ORAL_TABLET | Freq: Once | ORAL | Status: DC | PRN

## 2021-07-04 MED ORDER — CLOPIDOGREL BISULFATE 75 MG PO TABS
75.0000 mg | ORAL_TABLET | Freq: Every day | ORAL | Status: DC
  Filled 2021-07-04: qty 1

## 2021-07-04 MED ORDER — TAMSULOSIN HCL 0.4 MG PO CAPS
0.4000 mg | ORAL_CAPSULE | Freq: Every day | ORAL | Status: DC
  Administered 2021-07-04: 0.4 mg via ORAL
  Filled 2021-07-04: qty 1

## 2021-07-04 MED ORDER — LABETALOL HCL 5 MG/ML IV SOLN: 10.0000 mg | INTRAVENOUS | Status: DC | PRN

## 2021-07-04 MED ORDER — SODIUM CHLORIDE 0.9 % IV SOLN: INTRAVENOUS | Status: DC

## 2021-07-04 MED ORDER — ACETAMINOPHEN 325 MG RE SUPP
325.0000 mg | RECTAL | Status: DC | PRN
  Filled 2021-07-04: qty 2

## 2021-07-04 MED ORDER — METHOCARBAMOL 750 MG PO TABS
750.0000 mg | ORAL_TABLET | Freq: Four times a day (QID) | ORAL | Status: DC | PRN
  Administered 2021-07-04: 750 mg via ORAL
  Filled 2021-07-04 (×3): qty 1

## 2021-07-04 MED ORDER — METHYLPREDNISOLONE SODIUM SUCC 125 MG IJ SOLR: 125.0000 mg | Freq: Once | INTRAMUSCULAR | Status: DC | PRN

## 2021-07-04 MED ORDER — GABAPENTIN 300 MG PO CAPS: 300.0000 mg | ORAL_CAPSULE | Freq: Three times a day (TID) | ORAL | Status: DC

## 2021-07-04 MED ORDER — FUROSEMIDE 20 MG PO TABS
20.0000 mg | ORAL_TABLET | Freq: Every day | ORAL | Status: DC
Start: 2021-07-04 — End: 2021-07-05
  Administered 2021-07-04 – 2021-07-05 (×2): 20 mg via ORAL
  Filled 2021-07-04 (×2): qty 1

## 2021-07-04 MED ORDER — SODIUM CHLORIDE 0.9 % IV SOLN: 500.0000 mL | Freq: Once | INTRAVENOUS | Status: DC | PRN

## 2021-07-04 MED ORDER — CHLORHEXIDINE GLUCONATE CLOTH 2 % EX PADS
6.0000 | MEDICATED_PAD | Freq: Every day | CUTANEOUS | Status: DC
  Administered 2021-07-05: 6 via TOPICAL

## 2021-07-04 MED ORDER — HEPARIN SODIUM (PORCINE) 1000 UNIT/ML IJ SOLN
INTRAMUSCULAR | Status: DC | PRN
  Administered 2021-07-04: 8000 [IU] via INTRAVENOUS

## 2021-07-04 MED ORDER — CLOPIDOGREL BISULFATE 75 MG PO TABS: 75.0000 mg | ORAL_TABLET | Freq: Every day | ORAL | Status: DC

## 2021-07-04 MED ORDER — ATORVASTATIN CALCIUM 80 MG PO TABS
80.0000 mg | ORAL_TABLET | Freq: Every day | ORAL | Status: DC
Start: 2021-07-04 — End: 2021-07-04
  Filled 2021-07-04: qty 1

## 2021-07-04 MED ORDER — HYDRALAZINE HCL 20 MG/ML IJ SOLN: 5.0000 mg | INTRAMUSCULAR | Status: DC | PRN

## 2021-07-04 MED ORDER — METOPROLOL TARTRATE 5 MG/5ML IV SOLN: 2.0000 mg | INTRAVENOUS | Status: DC | PRN

## 2021-07-04 MED ORDER — PANTOPRAZOLE SODIUM 40 MG PO TBEC
40.0000 mg | DELAYED_RELEASE_TABLET | Freq: Every day | ORAL | Status: DC
  Administered 2021-07-05: 40 mg via ORAL
  Filled 2021-07-04: qty 1

## 2021-07-04 MED ORDER — POTASSIUM CHLORIDE CRYS ER 20 MEQ PO TBCR: 20.0000 meq | EXTENDED_RELEASE_TABLET | Freq: Every day | ORAL | Status: DC | PRN

## 2021-07-04 MED ORDER — ONDANSETRON HCL 4 MG/2ML IJ SOLN: 4.0000 mg | Freq: Four times a day (QID) | INTRAMUSCULAR | Status: DC | PRN

## 2021-07-04 MED ORDER — MAGNESIUM SULFATE 2 GM/50ML IV SOLN
2.0000 g | Freq: Every day | INTRAVENOUS | Status: DC | PRN
  Filled 2021-07-04: qty 50

## 2021-07-04 MED ORDER — HYDROMORPHONE HCL 1 MG/ML IJ SOLN: 1.0000 mg | Freq: Once | INTRAMUSCULAR | Status: DC | PRN

## 2021-07-04 MED ORDER — CEFAZOLIN SODIUM-DEXTROSE 2-4 GM/100ML-% IV SOLN
2.0000 g | Freq: Three times a day (TID) | INTRAVENOUS | Status: AC
  Administered 2021-07-04 – 2021-07-05 (×2): 2 g via INTRAVENOUS
  Filled 2021-07-04 (×3): qty 100

## 2021-07-04 MED ORDER — MORPHINE SULFATE (PF) 4 MG/ML IV SOLN: 2.0000 mg | INTRAVENOUS | Status: DC | PRN

## 2021-07-04 MED ORDER — HEPARIN SODIUM (PORCINE) 1000 UNIT/ML IJ SOLN
INTRAMUSCULAR | Status: AC
  Filled 2021-07-04: qty 10

## 2021-07-04 MED ORDER — DIPHENHYDRAMINE HCL 50 MG/ML IJ SOLN: 50.0000 mg | Freq: Once | INTRAMUSCULAR | Status: DC | PRN

## 2021-07-04 MED ORDER — OXYCODONE-ACETAMINOPHEN 5-325 MG PO TABS
1.0000 | ORAL_TABLET | ORAL | Status: DC | PRN
  Administered 2021-07-04: 1 via ORAL
  Filled 2021-07-04: qty 1

## 2021-07-04 MED ORDER — PHENYLEPHRINE HCL-NACL 20-0.9 MG/250ML-% IV SOLN
INTRAVENOUS | Status: AC
  Filled 2021-07-04: qty 250

## 2021-07-04 MED ORDER — MIDAZOLAM HCL 2 MG/2ML IJ SOLN
INTRAMUSCULAR | Status: DC | PRN
  Administered 2021-07-04 (×2): 1 mg via INTRAVENOUS

## 2021-07-04 MED ORDER — METOPROLOL SUCCINATE ER 50 MG PO TB24
100.0000 mg | ORAL_TABLET | Freq: Every day | ORAL | Status: DC
  Administered 2021-07-05: 100 mg via ORAL
  Filled 2021-07-04: qty 2

## 2021-07-04 MED ORDER — FENTANYL CITRATE (PF) 100 MCG/2ML IJ SOLN
INTRAMUSCULAR | Status: DC | PRN
Start: 2021-07-04 — End: 2021-07-04
  Administered 2021-07-04: 50 ug via INTRAVENOUS

## 2021-07-04 MED ORDER — CEFAZOLIN SODIUM-DEXTROSE 2-4 GM/100ML-% IV SOLN
2.0000 g | Freq: Once | INTRAVENOUS | Status: AC
  Administered 2021-07-04: 2 g via INTRAVENOUS

## 2021-07-04 MED ORDER — ASPIRIN EC 81 MG PO TBEC
81.0000 mg | DELAYED_RELEASE_TABLET | Freq: Every day | ORAL | Status: DC
  Administered 2021-07-05: 81 mg via ORAL
  Filled 2021-07-04: qty 1

## 2021-07-04 MED ORDER — LISINOPRIL 10 MG PO TABS
10.0000 mg | ORAL_TABLET | Freq: Every day | ORAL | Status: DC
  Administered 2021-07-05: 10 mg via ORAL
  Filled 2021-07-04: qty 1

## 2021-07-04 MED ORDER — PHENOL 1.4 % MT LIQD: 1.0000 | OROMUCOSAL | Status: DC | PRN

## 2021-07-04 MED ORDER — OXYCODONE HCL 5 MG PO TABS: 5.0000 mg | ORAL_TABLET | ORAL | Status: DC | PRN

## 2021-07-04 SURGICAL SUPPLY — 18 items
BALLN VTRAC 4.5X20X135 (BALLOONS) ×2
BALLOON VTRAC 4.5X20X135 (BALLOONS) IMPLANT
CATH ANGIO 5F PIGTAIL 100CM (CATHETERS) ×1 IMPLANT
CATH BEACON 5 .035 100 H1 TIP (CATHETERS) ×1 IMPLANT
DEVICE EMBOSHIELD NAV6 4.0-7.0 (FILTER) ×1 IMPLANT
DEVICE SAFEGUARD 24CM (GAUZE/BANDAGES/DRESSINGS) ×1 IMPLANT
DEVICE STARCLOSE SE CLOSURE (Vascular Products) ×1 IMPLANT
DEVICE TORQUE .025-.038 (MISCELLANEOUS) ×1 IMPLANT
GLIDEWIRE ANGLED SS 035X260CM (WIRE) ×1 IMPLANT
GUIDEWIRE VASC STIFF .038X260 (WIRE) ×1 IMPLANT
KIT CAROTID MANIFOLD (MISCELLANEOUS) ×1 IMPLANT
KIT ENCORE 26 ADVANTAGE (KITS) ×1 IMPLANT
PACK ANGIOGRAPHY (CUSTOM PROCEDURE TRAY) ×2 IMPLANT
SHEATH BRITE TIP 5FRX11 (SHEATH) ×1 IMPLANT
SHEATH SHUTTLE 6FRX80 (SHEATH) ×1 IMPLANT
STENT XACT CAR 9-7X40X136 (Permanent Stent) ×1 IMPLANT
SYR MEDRAD MARK 7 150ML (SYRINGE) ×1 IMPLANT
WIRE GUIDERIGHT .035X150 (WIRE) ×1 IMPLANT

## 2021-07-04 NOTE — Op Note (Signed)
? ? ?OPERATIVE NOTE ?DATE: 07/04/2021 ? ?PROCEDURE: ? Ultrasound guidance for vascular access right femoral artery ? Placement of a 9 mm proximal, 7 mm distal, 4 cm long exact stent with the use of the NAV-6 embolic protection device in the right carotid artery ? ?PRE-OPERATIVE DIAGNOSIS: 1.  High-grade ulcerative right carotid artery stenosis. ?2.  Previous head neck cancer with radiation therapy ? ?POST-OPERATIVE DIAGNOSIS:  Same as above ? ?SURGEON: Leotis Pain, MD ? ?ASSISTANT(S): None ? ?ANESTHESIA: local/MCS ? ?ESTIMATED BLOOD LOSS: 20 cc ? ?CONTRAST: 85 cc ? ?FLUORO TIME: 5 minutes ? ?MODERATE CONSCIOUS SEDATION TIME:  Approximately 41 minutes using 2 mg of Versed and 50 mcg of Fentanyl ? ?FINDING(S): ?1.   Highly ulcerative 85% right carotid artery stenosis ? ?SPECIMEN(S):   none ? ?INDICATIONS:   ?Patient is a 76 y.o. male who presents with radiation-induced high-grade right carotid artery stenosis.  The patient has previous radiation therapy as well as a high lesion and carotid artery stenting was felt to be preferred to endarterectomy for that reason.  Risks and benefits were discussed and informed consent was obtained.  ? ?DESCRIPTION: ?After obtaining full informed written consent, the patient was brought back to the vascular suite and placed supine upon the table.  The patient received IV antibiotics prior to induction. Moderate conscious sedation was administered during a face to face encounter with the patient throughout the procedure with my supervision of the RN administering medicines and monitoring the patients vital signs and mental status throughout from the start of the procedure until the patient was taken to the recovery room.  After obtaining adequate anesthesia, the patient was prepped and draped in the standard fashion.   The right femoral artery was visualized with ultrasound and found to be widely patent. It was then accessed under direct ultrasound guidance without difficulty with a  Seldinger needle. A permanent image was recorded. A J-wire was placed and we then placed a 6 French sheath. The patient was then heparinized and a total of 8000 units of intravenous heparin were given and an ACT was checked to confirm successful anticoagulation. A pigtail catheter was then placed into the ascending aorta. This showed a type I aortic arch with normal origins of the great vessels. I then selectively cannulated the innominate artery without difficulty with a headhunter catheter and advanced into the mid right common carotid artery.  Cervical and cerebral carotid angiography was then performed. There were no obvious intracranial filling defects with a small amount of cross-filling. The carotid bifurcation demonstrated a high-grade ulcerative lesion about 1 to 2 cm beyond the origin of the internal carotid artery and the 85%.  I then advanced into the external carotid artery with a Glidewire and the headhunter catheter and then exchanged for the Amplatz Super Stiff wire. Over the Amplatz Super Stiff wire, a 6 [place] shuttle sheath was placed into the mid common carotid artery. I then used the NAV-6  Embolic protection device and crossed the lesion and parked this in the distal internal carotid artery at the base of the skull.  I then selected a 9 mm proximal, 7 mm distal, 4 cm long exact stent. This was deployed across the lesion encompassing it in its entirety. A 4.5 mm diameter by 2 cm length balloon was used to post dilate the stent. Only about a 10% residual stenosis was present after angioplasty. Completion angiogram showed normal intracranial filling without new defects. At this point I elected to terminate the procedure. The sheath  was removed and StarClose closure device was deployed in the right femoral artery with excellent hemostatic result. The patient was taken to the recovery room in stable condition having tolerated the procedure well. ? ?COMPLICATIONS: none ? ?CONDITION: stable ? ?Leotis Pain ?07/04/2021 ?9:07 AM ? ? ?This note was created with Dragon Medical transcription system. Any errors in dictation are purely unintentional.  ?

## 2021-07-04 NOTE — Progress Notes (Signed)
[employer] ?

## 2021-07-04 NOTE — Progress Notes (Addendum)
1000 Patient received from Vascular Lab. Awake and alert. Right groin site PAD present with 40 ml of air present.1200 Remains stable and alert. 2 hour straight leg time over and patient eating lunch. Friend in to visit. No family in [place]. 1600 PAD removed and area cleaned. Clean gauze and tegaderm placed over puncture area in right groin. 1700 Dr. Lucky Cowboy in to see patient. ?

## 2021-07-04 NOTE — Progress Notes (Signed)
Pt. In Afib on arrival to specials recovery. Dr. Lucky Cowboy made aware. Pt. States he is a pt. Of Dr. Nehemiah Massed. Consult made to Dr. Nehemiah Massed now. 12 lead EKG done. Pt. Asymptomatic.  ?

## 2021-07-04 NOTE — Interval H&P Note (Signed)
History and Physical Interval Note: ? ?07/04/2021 ?8:13 AM ? ?Blake [name]  has presented today for surgery, with the diagnosis of RT Carotid Stent Placement    ABBOTT   Carotid artery stenosis.  The various methods of treatment have been discussed with the patient and family. After consideration of risks, benefits and other options for treatment, the patient has consented to  Procedure(s): ?CAROTID PTA/STENT INTERVENTION (Right) as a surgical intervention.  The patient's history has been reviewed, patient examined, no change in status, stable for surgery.  I have reviewed the patient's chart and labs.  Questions were answered to the patient's satisfaction.   ? ? ?Leotis Pain ? ? ?

## 2021-07-04 NOTE — Consult Note (Signed)
? ?[facility] Consultation Note  ?Patient ID: Blake [name], MRN: [id number], DOB/AGE: [birth year] 76 y.o. ?Admit date: 07/04/2021   Date of Consult: 07/04/2021 ?Primary Physician: Idelle Crouch, MD ?Primary Cardiologist: Nehemiah Massed ? ?Chief Complaint: No chief complaint on file. ? ?Reason for Consult:  Atrial fibrillation ? ?HPI: 76 y.o. male with known peripheral vascular disease hypertension hyperlipidemia and paroxysmal nonvalvular atrial fibrillation who has had a significant carotid atherosclerosis requiring further intervention.  The patient has undergone carotid intervention without evidence of significant complication and felt well but did have episode of atrial fibrillation with rapid ventricular rate after the procedure.  He was given his metoprolol back orally this afternoon which also includes multiple other medications for hypertension control and he spontaneously is converted back to normal sinus rhythm.  There has been no evidence of congestive heart failure angina or other significant cardiovascular symptoms at this time. ? ?Past Medical History:  ?Diagnosis Date  ? Arthritis   ? Back pain   ? with leg pain  ? Basal cell carcinoma 09/04/2009  ? R cheek 5.5 cm ant to earlobe - 12/26/2009 excision  ? Basal cell carcinoma 09/04/2009  ? R ant nasal alar rim  ? Basal cell carcinoma 07/31/2014  ? R mid brow  ? Basal cell carcinoma 07/30/2016  ? R ant nasal alar rim at ant edge of BCC scar  ? Basal cell carcinoma 11/02/2019  ? L zygoma   ? 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 s/p chemoradiation  ? 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, bilateral   ? Dyspnea   ? Dysrhythmia   ? Esophageal cancer ([place]) 2021  ? GERD (gastroesophageal reflux disease)   ? Hyperlipidemia   ? Hypertension   ? Numbness and tingling of both lower extremities   ? with positioning  ? Paroxysmal atrial fibrillation (HCC)   ? Port-A-Cath in place 2006  ? Prostate enlargement   ? 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 L pretibial - ED&C   ? T2DM (type 2 diabetes mellitus) ([place])   ?   ? ?Surgical History:  ?Past Surgical History:  ?Procedure Laterality Date  ? ABDOMINAL SURGERY    ? CAROTID PTA/STENT INTERVENTION Right 07/04/2021  ? Procedure: CAROTID PTA/STENT INTERVENTION;  Surgeon: Algernon Huxley, MD;  Location: [place] [facility];  Service: Cardiovascular;  Laterality: Right;  ? COLONOSCOPY  12/08/2003  ? COLONOSCOPY WITH PROPOFOL N/A 03/21/2019  ? Procedure: COLONOSCOPY WITH PROPOFOL;  Surgeon: Toledo, Benay Pike, MD;  Location: [facility];  Service: Gastroenterology;  Laterality: N/A;  ? ESOPHAGOGASTRODUODENOSCOPY (EGD) WITH PROPOFOL N/A 03/21/2019  ? Procedure: ESOPHAGOGASTRODUODENOSCOPY (EGD) WITH PROPOFOL;  Surgeon: Toledo, Benay Pike, MD;  Location: [facility];  Service: Gastroenterology;  Laterality: N/A;  ? esophogeal cancer    ? JOINT REPLACEMENT Right   ? total hip  ? LUMBAR LAMINECTOMY/DECOMPRESSION MICRODISCECTOMY N/A 03/03/2018  ? Procedure: LUMBAR LAMINECTOMY/DECOMPRESSION MICRODISCECTOMY 1 LEVEL-L3-4,L4-5;  Surgeon: Meade Maw, MD;  Location: [facility];  Service: Neurosurgery;  Laterality: N/A;  ? LUMBAR LAMINECTOMY/DECOMPRESSION MICRODISCECTOMY N/A 02/24/2020  ? Procedure: LEFT L3-4 MICRODISCECTOMY, L4-5 DECOMPRESSION;  Surgeon: Meade Maw, MD;  Location: [facility];  Service: Neurosurgery;  Laterality: N/A;  ? MAXIMUM ACCESS (MAS) TRANSFORAMINAL LUMBAR INTERBODY FUSION (TLIF) 1 LEVEL N/A 07/13/2020  ? Procedure: OPEN L3-4 TRANSFORAMINAL LUMBAR INTERBODY FUSION (TLIF);  Surgeon: Meade Maw, MD;  Location: [facility];  Service: Neurosurgery;  Laterality: N/A;  ? PARTIAL HIP ARTHROPLASTY Right 2014  ? PORT-A-CATH REMOVAL    ?  PORTACATH PLACEMENT Left 01/12/2020  ? Procedure: INSERTION PORT-A-CATH, with ultrasound fluoroscopy;  Surgeon: Nestor Lewandowsky, MD;  Location: [facility];  Service: General;  Laterality: Left;  ? TONGUE BIOPSY  2006  ? TONSILLECTOMY    ? TRIGGER FINGER  RELEASE Right 2013  ? UPPER GI ENDOSCOPY  01/25/14  ? multiple gastric polyps  ?  ? ?Home Meds: ?Prior to Admission medications   ?Medication Sig Start Date End Date Taking? Authorizing Provider  ?lisinopril (ZESTRIL) 10 MG tablet TAKE 1 TABLET (10 MG TOTAL) BY MOUTH DAILY. 05/06/21 05/06/22 Yes   ?methocarbamol (ROBAXIN) 500 MG tablet Take 1 and 1/2 tablets (750 mg total) by mouth every 6 (six) hours as needed for muscle spasms. 06/24/21  Yes   ?metoprolol succinate (TOPROL-XL) 100 MG 24 hr tablet Take 100 mg by mouth daily.   Yes [provider]  ?omeprazole (PRILOSEC) 40 MG capsule Take 40 mg by mouth 2 (two) times daily.   Yes [provider]  ?tamsulosin (FLOMAX) 0.4 MG CAPS capsule TAKE 1 CAPSULE BY MOUTH ONCE DAILY. TAKE 30 MINUTES AFTER SAME MEAL EACH DAY. 10/23/20 10/23/21 Yes   ?acetaminophen (TYLENOL) 325 MG tablet Take 2 tablets (650 mg total) by mouth every 4 (four) hours as needed for mild pain ((score 1 to 3) or temp > 100.5). ?Patient not taking: Reported on 10/29/2020 07/16/20   Deetta Perla, MD  ?amiodarone (PACERONE) 200 MG tablet TAKE 1 TABLET BY MOUTH ONCE DAILY ?Patient not taking: Reported on 04/30/2021 12/03/20 12/03/21    ?amiodarone (PACERONE) 200 MG tablet Take 1 tablet (200 mg total) by mouth once daily ?Patient not taking: Reported on 01/28/2021 12/04/20     ?atorvastatin (LIPITOR) 80 MG tablet TAKE 1 TABLET BY MOUTH ONCE DAILY 02/13/20 04/30/21  Pricilla Larsson, NP  ?clopidogrel (PLAVIX) 75 MG tablet Take 1 tablet (75 mg total) by mouth daily. 06/18/21   Algernon Huxley, MD  ?furosemide (LASIX) 20 MG tablet TAKE 1 TABLET (20 MG TOTAL) BY MOUTH DAILY. ?Patient not taking: Reported on 10/29/2020 06/27/20 06/27/21  Oswald Hillock, MD  ?gabapentin (NEURONTIN) 300 MG capsule TAKE 1 CAPSULE (300 MG TOTAL) BY MOUTH 3 (THREE) TIMES DAILY ?Patient not taking: No sig reported 02/03/20 02/02/21  Idelle Crouch, MD  ?hydrocortisone 2.5 % lotion For seborrheic dermatitis. Apply to affected area on face  every Monday, Wednesday, and Friday. ?Patient not taking: Reported on 10/29/2020 10/18/20   Ralene Bathe, MD  ?metFORMIN (GLUCOPHAGE) 500 MG tablet TAKE 1 TABLET BY MOUTH TWICE DAILY WITH MEALS ?Patient not taking: Reported on 04/30/2021 08/17/19 01/28/21  Idelle Crouch, MD  ?metoprolol succinate (TOPROL-XL) 100 MG 24 hr tablet Take one tablet by mouth once daily ?Patient not taking: Reported on 01/28/2021 12/03/20     ?metoprolol succinate (TOPROL-XL) 100 MG 24 hr tablet Take 1.5 tablets (150 mg total) by mouth once daily 01/14/21     ?mirtazapine (REMERON) 15 MG tablet Take 1 tablet (15 mg total) by mouth nightly ?Patient not taking: Reported on 01/28/2021 07/02/20     ?omeprazole (PRILOSEC) 40 MG capsule take one capsule by mouth once daily ?Patient not taking: Reported on 07/01/2021 12/03/20     ?oxyCODONE (OXY IR/ROXICODONE) 5 MG immediate release tablet Take 1-2 tablets (5-10 mg total) by mouth every 4 (four) hours as needed for moderate pain (5 mg for pain 4-6/10, 10 mg for 7-10/10). ?Patient not taking: Reported on 10/29/2020 07/16/20   Deetta Perla, MD  ?promethazine (PHENERGAN) 25 MG tablet Take  1 tablet (25 mg total) by mouth every 6 (six) hours as needed. ?Patient not taking: Reported on 04/30/2021 10/29/20 10/29/21  Earlie Server, MD  ?senna-docusate (SENOKOT-S) 8.6-50 MG tablet Take 1 tablet by mouth 2 (two) times daily. ?Patient not taking: Reported on 10/29/2020 07/16/20   Deetta Perla, MD  ?tamsulosin (FLOMAX) 0.4 MG CAPS capsule Take 1 capsule (0.4 mg total) by mouth once daily. TAKE 30 MINUTES AFTER SAME MEAL EACH DAY 10/23/20     ?prochlorperazine (COMPAZINE) 10 MG tablet Take 1 tablet (10 mg total) by mouth every 6 (six) hours as needed (Nausea or vomiting). 12/09/19 12/30/19  Earlie Server, MD  ? ? ?Inpatient Medications:  ? [START ON 07/05/2021] aspirin EC  81 mg Oral Q0600  ? atorvastatin  80 mg Oral Daily  ? [START ON 07/05/2021] clopidogrel  75 mg Oral Q0600  ? fentaNYL      ? furosemide  20 mg Oral Daily  ? gabapentin   300 mg Oral TID  ? heparin sodium (porcine)      ? [START ON 07/05/2021] lisinopril  10 mg Oral Daily  ? [START ON 07/05/2021] metoprolol succinate  100 mg Oral Daily  ? midazolam      ? [START ON 07/05/2021] pantop

## 2021-07-05 ENCOUNTER — Other Ambulatory Visit: Payer: Self-pay

## 2021-07-05 DIAGNOSIS — I6521 Occlusion and stenosis of right carotid artery: Secondary | ICD-10-CM

## 2021-07-05 LAB — BASIC METABOLIC PANEL
Anion gap: 10 (ref 5–15)
BUN: 18 mg/dL (ref 8–23)
CO2: 27 mmol/L (ref 22–32)
Calcium: 8.4 mg/dL — ABNORMAL LOW (ref 8.9–10.3)
Chloride: 101 mmol/L (ref 98–111)
Creatinine, Ser: 1.04 mg/dL (ref 0.61–1.24)
GFR, Estimated: >60 mL/min (ref >60)
Glucose, Bld: 114 mg/dL — ABNORMAL HIGH (ref 70–99)
Potassium: 4 mmol/L (ref 3.5–5.1)
Sodium: 138 mmol/L (ref 135–145)

## 2021-07-05 LAB — CBC
HCT: 34.3 % — ABNORMAL LOW (ref 39.0–52.0)
Hemoglobin: 10.4 g/dL — ABNORMAL LOW (ref 13.0–17.0)
MCH: 23.9 pg — ABNORMAL LOW (ref 26.0–34.0)
MCHC: 30.3 g/dL (ref 30.0–36.0)
MCV: 78.7 fL — ABNORMAL LOW (ref 80.0–100.0)
Platelets: 236 10*3/uL (ref 150–400)
RBC: 4.36 MIL/uL (ref 4.22–5.81)
RDW: 17.2 % — ABNORMAL HIGH (ref 11.5–15.5)
WBC: 7 10*3/uL (ref 4.0–10.5)
nRBC: 0 % (ref 0.0–0.2)

## 2021-07-05 MED ORDER — ASPIRIN 81 MG PO TBEC
81.0000 mg | DELAYED_RELEASE_TABLET | Freq: Every day | ORAL | 11 refills | Status: DC
  Filled 2021-07-05: qty 30, 30d supply, fill #0

## 2021-07-05 MED ORDER — HEPARIN SOD (PORK) LOCK FLUSH 100 UNIT/ML IV SOLN
500.0000 [IU] | Freq: Once | INTRAVENOUS | Status: AC
  Administered 2021-07-05: 500 [IU] via INTRAVENOUS
  Filled 2021-07-05 (×2): qty 5

## 2021-07-05 MED ORDER — FAMOTIDINE 20 MG PO TABS: 20.0000 mg | ORAL_TABLET | Freq: Two times a day (BID) | ORAL | Status: DC

## 2021-07-05 NOTE — Progress Notes (Signed)
Pt refusing plavix. Pt states "I can pinch myself and I bleed". Educated patient on the importance of taking plavix. Pt continues to refuse.  ?

## 2021-07-05 NOTE — Progress Notes (Signed)
PHARMACIST - PHYSICIAN COMMUNICATION ? ?CONCERNING: IV to Oral Route Change Policy ? ?RECOMMENDATION: ?This patient is receiving famotidine by the intravenous route.  Based on criteria approved by the Pharmacy and Therapeutics Committee, the intravenous medication(s) is/are being converted to the equivalent oral dose form(s). ? ? ?DESCRIPTION: ?These criteria include: ?The patient is eating (either orally or via tube) and/or has been taking other orally administered medications for a least 24 hours ?The patient has no evidence of active gastrointestinal bleeding or impaired GI absorption (gastrectomy, short bowel, patient on TNA or NPO). ? ?If you have questions about this conversion, please contact the Pharmacy Department  ? ?Benita Gutter, RPH ?07/05/2021 11:23 AM  ?

## 2021-07-05 NOTE — Progress Notes (Signed)
Pt refusing vital signs at this time. Pt refused pepcid as well. Pt educated on importance of vital signs in care as well as receiving ordered meds. Pt continued to refuse. Notified MD Dew. Instructed to obtain vital signs as often as patient will allow. Read back and verified.  ?

## 2021-07-05 NOTE — Progress Notes (Signed)
Pt. Discharged to home with family. Education given and all questions answered. V/S are stable and pt. Is in good spirits. Port Lt chest de-accessed and Heparin locked. ?

## 2021-07-05 NOTE — Discharge Summary (Signed)
?[facility]    ?Discharge Summary ? ? ? ?Patient ID:  ?Blake [name] ?MRN: [id number] ?DOB/AGE: 1946/02/01 76 y.o. ? ?Admit date: 07/04/2021 ?Discharge date: 07/05/2021 ?Date of Surgery: 07/04/2021 ?Surgeon: Surgeon(s): ?Algernon Huxley, MD ? ?Admission Diagnosis: ?Carotid stenosis, right [I65.21] ? ?Discharge Diagnoses:  ?Carotid stenosis, right [I65.21] ? ?Secondary Diagnoses: ?Past Medical History:  ?Diagnosis Date  ? Arthritis   ? Back pain   ? with leg pain  ? Basal cell carcinoma 09/04/2009  ? R cheek 5.5 cm ant to earlobe - 12/26/2009 excision  ? Basal cell carcinoma 09/04/2009  ? R ant nasal alar rim  ? Basal cell carcinoma 07/31/2014  ? R mid brow  ? Basal cell carcinoma 07/30/2016  ? R ant nasal alar rim at ant edge of BCC scar  ? Basal cell carcinoma 11/02/2019  ? L zygoma   ? 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 s/p chemoradiation  ? 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, bilateral   ? Dyspnea   ? Dysrhythmia   ? Esophageal cancer ([place]) 2021  ? GERD (gastroesophageal reflux disease)   ? Hyperlipidemia   ? Hypertension   ? Numbness and tingling of both lower extremities   ? with positioning  ? Paroxysmal atrial fibrillation (HCC)   ? Port-A-Cath in place 2006  ? Prostate enlargement   ? 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 L pretibial - ED&C   ? T2DM (type 2 diabetes mellitus) ([place])   ? ? ?Procedure(s): ?CAROTID PTA/STENT INTERVENTION ? ?Discharged Condition: good ? ?HPI:  ?Blake [name] is a 76 year old male that underwent right ICA stent placement on 07/04/2021.  The patient had a high-grade 85% ulcerative lesion.  He tolerated the procedure well.  He has some groin soreness but the groin is clean dry and intact with no evidence of hematoma.  He also had a headache with some soreness of the neck area but this is largely resolved this morning. ? ?Hospital Course:  ?Blake [name] is a 76 y.o. male is S/P Right ?Procedure(s): ?CAROTID PTA/STENT  INTERVENTION ?Extubated: POD # 0 ?Physical exam: Bilateral groins clean dry and intact ?Post-op wounds clean, dry, intact or healing well ?Pt. Ambulating, voiding and taking PO diet without difficulty. ?Pt pain controlled with PO pain meds. ?Labs as below ?Complications:none ? ?Consults:  ? ? ?Significant Diagnostic Studies: ?CBC ?Lab Results  ?Component Value Date  ? WBC 7.0 07/05/2021  ? HGB 10.4 (L) 07/05/2021  ? HCT 34.3 (L) 07/05/2021  ? MCV 78.7 (L) 07/05/2021  ? PLT 236 07/05/2021  ? ? ?BMET ?   ?Component Value Date/Time  ? NA 138 07/05/2021 0516  ? NA 130 (L) 01/26/2013 0505  ? K 4.0 07/05/2021 0516  ? K 4.6 01/26/2013 0505  ? CL 101 07/05/2021 0516  ? CL 101 01/26/2013 0505  ? CO2 27 07/05/2021 0516  ? CO2 26 01/26/2013 0505  ? GLUCOSE 114 (H) 07/05/2021 0516  ? GLUCOSE 138 (H) 01/26/2013 0505  ? BUN 18 07/05/2021 0516  ? BUN 19 (H) 01/26/2013 0505  ? CREATININE 1.04 07/05/2021 0516  ? CREATININE 1.18 01/26/2013 0505  ? CALCIUM 8.4 (L) 07/05/2021 0516  ? CALCIUM 8.3 (L) 01/26/2013 0505  ? GFRNONAA >60 07/05/2021 0516  ? GFRNONAA >60 01/26/2013 0505  ? GFRAA >60 12/28/2019 1328  ? GFRAA >60 01/26/2013 0505  ? ?COAG ?Lab Results  ?Component Value Date  ? INR 1.0 07/04/2020  ? INR 1.0 02/17/2020  ?  INR 1.0 01/10/2020  ? ? ? ?Disposition:  ?Discharge to :Home ? ?Allergies as of 07/05/2021   ? ?   Reactions  ? Sulfa Antibiotics Rash  ? ?  ? ?  ?Medication List  ?  ? ?TAKE these medications   ? ?acetaminophen 325 MG tablet ?Commonly known as: TYLENOL ?Take 2 tablets (650 mg total) by mouth every 4 (four) hours as needed for mild pain ((score 1 to 3) or temp > 100.5). ?  ?amiodarone 200 MG tablet ?Commonly known as: PACERONE ?TAKE 1 TABLET BY MOUTH ONCE DAILY ?  ?amiodarone 200 MG tablet ?Commonly known as: PACERONE ?Take 1 tablet (200 mg total) by mouth once daily ?  ?aspirin 81 MG EC tablet ?Take 1 tablet (81 mg total) by mouth daily at 6 (six) AM. Swallow whole. ?Start taking on: July 06, 2021 ?  ?atorvastatin  80 MG tablet ?Commonly known as: LIPITOR ?TAKE 1 TABLET BY MOUTH ONCE DAILY ?  ?clopidogrel 75 MG tablet ?Commonly known as: PLAVIX ?Take 1 tablet (75 mg total) by mouth daily. ?  ?furosemide 20 MG tablet ?Commonly known as: LASIX ?TAKE 1 TABLET (20 MG TOTAL) BY MOUTH DAILY. ?  ?gabapentin 300 MG capsule ?Commonly known as: NEURONTIN ?TAKE 1 CAPSULE (300 MG TOTAL) BY MOUTH 3 (THREE) TIMES DAILY ?  ?hydrocortisone 2.5 % lotion ?For seborrheic dermatitis. Apply to affected area on face every Monday, Wednesday, and Friday. ?  ?lisinopril 10 MG tablet ?Commonly known as: ZESTRIL ?TAKE 1 TABLET (10 MG TOTAL) BY MOUTH DAILY. ?  ?metFORMIN 500 MG tablet ?Commonly known as: GLUCOPHAGE ?TAKE 1 TABLET BY MOUTH TWICE DAILY WITH MEALS ?  ?methocarbamol 500 MG tablet ?Commonly known as: ROBAXIN ?Take 1 and 1/2 tablets (750 mg total) by mouth every 6 (six) hours as needed for muscle spasms. ?  ?metoprolol succinate 100 MG 24 hr tablet ?Commonly known as: TOPROL-XL ?Take 100 mg by mouth daily. ?  ?metoprolol succinate 100 MG 24 hr tablet ?Commonly known as: TOPROL-XL ?Take one tablet by mouth once daily ?  ?metoprolol succinate 100 MG 24 hr tablet ?Commonly known as: TOPROL-XL ?Take 1 & 1/2 tablets ('150mg'$ ) by mouth once daily ?(Take 1.5 tablets (150 mg total) by mouth once daily) ?  ?mirtazapine 15 MG tablet ?Commonly known as: REMERON ?Take 1 tablet (15 mg total) by mouth nightly ?  ?omeprazole 40 MG capsule ?Commonly known as: PRILOSEC ?Take 40 mg by mouth 2 (two) times daily. ?  ?omeprazole 40 MG capsule ?Commonly known as: PRILOSEC ?take one capsule by mouth once daily ?  ?oxyCODONE 5 MG immediate release tablet ?Commonly known as: Oxy IR/ROXICODONE ?Take 1-2 tablets (5-10 mg total) by mouth every 4 (four) hours as needed for moderate pain (5 mg for pain 4-6/10, 10 mg for 7-10/10). ?  ?promethazine 25 MG tablet ?Commonly known as: PHENERGAN ?Take 1 tablet (25 mg total) by mouth every 6 (six) hours as needed. ?  ?senna-docusate  8.6-50 MG tablet ?Commonly known as: Senokot-S ?Take 1 tablet by mouth 2 (two) times daily. ?  ?tamsulosin 0.4 MG Caps capsule ?Commonly known as: FLOMAX ?TAKE 1 CAPSULE BY MOUTH ONCE DAILY. TAKE 30 MINUTES AFTER SAME MEAL EACH DAY. ?  ?tamsulosin 0.4 MG Caps capsule ?Commonly known as: FLOMAX ?Take 1 capsule (0.4 mg total) by mouth once daily. TAKE 30 MINUTES AFTER SAME MEAL EACH DAY ?  ? ?  ? ?Verbal and written Discharge instructions given to the patient. Wound care per Discharge AVS ? Follow-up Information   ? ?  Kris Hartmann, NP Follow up in 4 week(s).   ?Specialty: Vascular Surgery ?Why: See JD/FB with Carotid ?Contact information: ?[address] ?[place] [address] ?[phone] ? ? ?  ?  ? ?  ?  ? ?  ? ? ?Signed: ?Kris Hartmann, NP ? ?07/05/2021, 11:59 AM ? ?  ? ?

## 2021-07-05 NOTE — Plan of Care (Signed)
  Problem: Health Behavior/Discharge Planning: Goal: Ability to manage health-related needs will improve Outcome: Progressing   Problem: Clinical Measurements: Goal: Ability to maintain clinical measurements within normal limits will improve Outcome: Progressing   

## 2021-07-05 NOTE — Progress Notes (Signed)
Initial Nutrition Assessment ? ?DOCUMENTATION CODES:  ? ?Non-severe (moderate) malnutrition in context of chronic illness ? ?INTERVENTION:  ? ?Recommend Ensure Enlive po BID, each supplement provides 350 kcal and 20 grams of protein. ? ?Recommend MVI po daily ? ?NUTRITION DIAGNOSIS:  ? ?Moderate Malnutrition related to cancer and cancer related treatments as evidenced by moderate fat depletion, moderate muscle depletion. ? ?GOAL:  ? ?Patient will meet greater than or equal to 90% of their needs ? ?MONITOR:  ? ?PO intake, Supplement acceptance, Labs, Weight trends, Skin, I & O's ? ?REASON FOR ASSESSMENT:  ? ?Malnutrition Screening Tool ?  ? ?ASSESSMENT:  ? ?76 y/o male with h/o DM, HTN, CAD, Barrett's esophagus, BPH, PAF, GERD, HLD, tongue cancer 2006 s/p chemo/XRT and adenocarcinoma of the esophagus (diagnosed 03/21/2019) s/p Iver Lewis esophagectomy w/ J-tube placement (now removed) 6/65/9935 complicated by anastamotic leak requiring EGD and stent 09/23/2019 who is admitted with radiation-induced high-grade right carotid artery stenosis now s/p vascular stent placement 4/6. ? ?Met with pt in room today. Pt reports good appetite and oral intake pta and in hospital; pt reports eating 100% of his breakfast this morning. Pt with h/o esophagectomy and J-tube placement. Pt reports his J-tube was removed a couple of months after he finished treatments. Pt reports that he lost ~55lbs during his cancer treatments but reports that he has remained weight stable around 150lbs for the past year. Pt reports that he enjoys chocolate Ensure and drinks this at home sometimes. Pt to discharge today. Will add supplements and MVI if pt does not discharge.  ? ?Medications reviewed and include: aspirin, plavix, pepcid, lasix, protonix ? ?Labs reviewed: K 4.0 wnl ?Hgb 10.4(L), Hct 34.3(L), MCV 78.7(L), MCH 23.9(L) ? ?NUTRITION - FOCUSED PHYSICAL EXAM: ? ?Flowsheet Row Most Recent Value  ?Orbital Region No depletion  ?Upper Arm Region  Moderate depletion  ?Thoracic and Lumbar Region Mild depletion  ?Buccal Region No depletion  ?Temple Region No depletion  ?Clavicle Bone Region Moderate depletion  ?Clavicle and Acromion Bone Region Moderate depletion  ?Scapular Bone Region Mild depletion  ?Dorsal Hand Mild depletion  ?Patellar Region Moderate depletion  ?Anterior Thigh Region Moderate depletion  ?Posterior Calf Region Moderate depletion  ?Edema (RD Assessment) None  ?Hair Reviewed  ?Eyes Reviewed  ?Mouth Reviewed  ?Skin Reviewed  ?Nails Reviewed  ? ?Diet Order:   ?Diet Order   ? ?       ?  Diet regular Room service appropriate? Yes; Fluid consistency: Thin  Diet effective now       ?  ? ?  ?  ? ?  ? ?EDUCATION NEEDS:  ? ?Education needs have been addressed ? ?Skin:  Skin Assessment: Reviewed RN Assessment ? ?Last BM:  4/6 ? ?Height:  ? ?Ht Readings from Last 1 Encounters:  ?07/04/21 '5\' 5"'  (1.651 m)  ? ? ?Weight:  ? ?Wt Readings from Last 1 Encounters:  ?07/04/21 65.6 kg  ? ? ?Ideal Body Weight:  61.8 kg ? ?BMI:  Body mass index is 24.07 kg/m?. ? ?Estimated Nutritional Needs:  ? ?Kcal:  1700-2000kcal/day ? ?Protein:  85-100g/day ? ?Fluid:  1.7-1.9L/day ? ?Koleen Distance MS, RD, LDN ?Please refer to [facility] for RD and/or RD on-call/weekend/after hours pager ? ?

## 2021-07-08 ENCOUNTER — Other Ambulatory Visit: Payer: Self-pay

## 2021-07-09 ENCOUNTER — Other Ambulatory Visit: Payer: Self-pay

## 2021-07-09 MED ORDER — MIRTAZAPINE 15 MG PO TABS
ORAL_TABLET | ORAL | 11 refills | Status: DC
  Filled 2021-07-09: qty 30, 30d supply, fill #0
  Filled 2021-08-08: qty 30, 30d supply, fill #1
  Filled 2021-09-06: qty 30, 30d supply, fill #2
  Filled 2021-10-10: qty 30, 30d supply, fill #3
  Filled 2021-11-07: qty 30, 30d supply, fill #4
  Filled 2021-12-09: qty 30, 30d supply, fill #5
  Filled 2022-01-06: qty 30, 30d supply, fill #6
  Filled 2022-02-05: qty 30, 30d supply, fill #7
  Filled 2022-03-10: qty 30, 30d supply, fill #8
  Filled 2022-04-09: qty 30, 30d supply, fill #9
  Filled 2022-04-28: qty 30, 30d supply, fill #10
  Filled 2022-06-25: qty 30, 30d supply, fill #11

## 2021-07-18 ENCOUNTER — Inpatient Hospital Stay: Payer: Medicare HMO

## 2021-07-18 DIAGNOSIS — R918 Other nonspecific abnormal finding of lung field: Secondary | ICD-10-CM | POA: Diagnosis not present

## 2021-07-18 DIAGNOSIS — C155 Malignant neoplasm of lower third of esophagus: Secondary | ICD-10-CM | POA: Diagnosis not present

## 2021-07-18 DIAGNOSIS — Z95828 Presence of other vascular implants and grafts: Secondary | ICD-10-CM

## 2021-07-18 MED ORDER — SODIUM CHLORIDE 0.9% FLUSH
10.0000 mL | Freq: Once | INTRAVENOUS | Status: AC
  Administered 2021-07-18: 10 mL via INTRAVENOUS
  Filled 2021-07-18: qty 10

## 2021-07-18 MED ORDER — HEPARIN SOD (PORK) LOCK FLUSH 100 UNIT/ML IV SOLN
500.0000 [IU] | Freq: Once | INTRAVENOUS | Status: AC
  Administered 2021-07-18: 500 [IU] via INTRAVENOUS
  Filled 2021-07-18: qty 5

## 2021-07-22 ENCOUNTER — Ambulatory Visit: Payer: Medicare HMO | Admitting: Dermatology

## 2021-07-22 ENCOUNTER — Encounter: Payer: Self-pay | Admitting: Dermatology

## 2021-07-22 DIAGNOSIS — D692 Other nonthrombocytopenic purpura: Secondary | ICD-10-CM

## 2021-07-22 DIAGNOSIS — L578 Other skin changes due to chronic exposure to nonionizing radiation: Secondary | ICD-10-CM

## 2021-07-22 DIAGNOSIS — C44319 Basal cell carcinoma of skin of other parts of face: Secondary | ICD-10-CM

## 2021-07-22 DIAGNOSIS — L821 Other seborrheic keratosis: Secondary | ICD-10-CM | POA: Diagnosis not present

## 2021-07-22 DIAGNOSIS — D492 Neoplasm of unspecified behavior of bone, soft tissue, and skin: Secondary | ICD-10-CM

## 2021-07-22 DIAGNOSIS — L820 Inflamed seborrheic keratosis: Secondary | ICD-10-CM | POA: Diagnosis not present

## 2021-07-22 DIAGNOSIS — C44310 Basal cell carcinoma of skin of unspecified parts of face: Secondary | ICD-10-CM

## 2021-07-22 NOTE — Progress Notes (Signed)
? ?Follow-Up Visit ?  ?Subjective  ?Blake [name] is a 76 y.o. male who presents for the following: Actinic Keratosis (2 month recheck. Tx with LN2. Also ISK's treated with LN2 at last visit. Face, neck, hands. Some areas have resolved some have not). ?The patient has spots, moles and lesions to be evaluated, some may be new or changing and the patient has concerns that these could be cancer. ? ?The following portions of the chart were reviewed this encounter and updated as appropriate:  Tobacco  Allergies  Meds  Problems  Med Hx  Surg Hx  Fam Hx   ?  ?Review of Systems: No other skin or systemic complaints except as noted in HPI or Assessment and Plan. ? ?Objective  ?Well appearing patient in no apparent distress; mood and affect are within normal limits. ? ?A focused examination was performed including face, neck hands. Relevant physical exam findings are noted in the Assessment and Plan. ? ?Left sideburn area ?1.0 cm erythematous keratotic plaque ? ? ? ? ?Left Forearm - Posterior x2 (2) ?Erythematous keratotic or waxy stuck-on papule or plaque. ? ? ?Assessment & Plan  ? ?Actinic Damage ?- chronic, secondary to cumulative UV radiation exposure/sun exposure over time ?- diffuse scaly erythematous macules with underlying dyspigmentation ?- Recommend daily broad spectrum sunscreen SPF 30+ to sun-exposed areas, reapply every 2 hours as needed.  ?- Recommend staying in the shade or wearing long sleeves, sun glasses (UVA+UVB protection) and wide brim hats (4-inch brim around the entire circumference of the hat). ?- Call for new or changing lesions. ? ?Seborrheic Keratoses ?- Stuck-on, waxy, tan-brown papules and/or plaques  ?- Benign-appearing ?- Discussed benign etiology and prognosis. ?- Observe ?- Call for any changes ? ?Purpura - Chronic; persistent and recurrent.  Treatable, but not curable. ?- Violaceous macules and patches ?- Benign ?- Related to trauma, age, sun damage and/or use of blood thinners,  chronic use of topical and/or oral steroids ?- Observe ?- Can use OTC arnica containing moisturizer such as Dermend Bruise Formula if desired ?- Call for worsening or other concerns ? ?Neoplasm of skin ?Left sideburn area ?Epidermal / dermal shaving ? ?Lesion diameter (cm):  1 ?Informed consent: discussed and consent obtained   ?Timeout: patient name, date of birth, surgical site, and procedure verified   ?Procedure prep:  Patient was prepped and draped in usual sterile fashion ?Prep type:  Isopropyl alcohol ?Anesthesia: the lesion was anesthetized in a standard fashion   ?Anesthetic:  1% lidocaine w/ epinephrine 1-100,000 buffered w/ 8.4% NaHCO3 ?Instrument used: flexible razor blade   ?Hemostasis achieved with: pressure, aluminum chloride and electrodesiccation   ?Outcome: patient tolerated procedure well   ?Post-procedure details: sterile dressing applied and wound care instructions given   ?Dressing type: bandage and petrolatum   ? ?Destruction of lesion ?Complexity: extensive   ?Destruction method: electrodesiccation and curettage   ?Informed consent: discussed and consent obtained   ?Timeout:  patient name, date of birth, surgical site, and procedure verified ?Procedure prep:  Patient was prepped and draped in usual sterile fashion ?Prep type:  Isopropyl alcohol ?Anesthesia: the lesion was anesthetized in a standard fashion   ?Anesthetic:  1% lidocaine w/ epinephrine 1-100,000 buffered w/ 8.4% NaHCO3 ?Curettage performed in three different directions: Yes   ?Electrodesiccation performed over the curetted area: Yes   ?Curettage cycles:  3 ?Lesion length (cm):  1 ?Lesion width (cm):  1 ?Margin per side (cm):  0.2 ?Final wound size (cm):  1.4 ?Hemostasis achieved with:  pressure and aluminum chloride ?Outcome: patient tolerated procedure well with no complications   ?Post-procedure details: sterile dressing applied and wound care instructions given   ?Dressing type: bandage and petrolatum   ? ?Specimen 1 -  Surgical pathology ?Differential Diagnosis: ISK vs BCC ?Check Margins: No ? ?Inflamed seborrheic keratosis (2) ?Left Forearm - Posterior x2 ?Destruction of lesion - Left Forearm - Posterior x2 ?Complexity: simple   ?Destruction method: cryotherapy   ?Informed consent: discussed and consent obtained   ?Timeout:  patient name, date of birth, surgical site, and procedure verified ?Lesion destroyed using liquid nitrogen: Yes   ?Region frozen until ice ball extended beyond lesion: Yes   ?Outcome: patient tolerated procedure well with no complications   ?Post-procedure details: wound care instructions given   ? ?Return for TBSE As Scheduled, HxBCC's. ? ?I, [name], CMA, am acting as scribe for Sarina Ser, MD. ?Documentation: I have reviewed the above documentation for accuracy and completeness, and I agree with the above. ? ?Sarina Ser, MD ? ?

## 2021-07-22 NOTE — Patient Instructions (Addendum)
Wound Care Instructions ? ?Cleanse wound gently with soap and water once a day then pat dry with clean gauze. Apply a thing coat of Petrolatum (petroleum jelly, "Vaseline") over the wound (unless you have an allergy to this). We recommend that you use a new, sterile tube of Vaseline. Do not pick or remove scabs. Do not remove the yellow or white "healing tissue" from the base of the wound. ? ?Cover the wound with fresh, clean, nonstick gauze and secure with paper tape. You may use Band-Aids in place of gauze and tape if the would is small enough, but would recommend trimming much of the tape off as there is often too much. Sometimes Band-Aids can irritate the skin. ? ?You should call the office for your biopsy report after 1 week if you have not already been contacted. ? ?If you experience any problems, such as abnormal amounts of bleeding, swelling, significant bruising, significant pain, or evidence of infection, please call the office immediately. ? ?FOR ADULT SURGERY PATIENTS: If you need something for pain relief you may take 1 extra strength Tylenol (acetaminophen) AND 2 Ibuprofen (200mg each) together every 4 hours as needed for pain. (do not take these if you are allergic to them or if you have a reason you should not take them.) Typically, you may only need pain medication for 1 to 3 days.  ? ?Cryotherapy Aftercare ? ?Wash gently with soap and water everyday.   ?Apply Vaseline and Band-Aid daily until healed.  ? ?Prior to procedure, discussed risks of blister formation, small wound, skin dyspigmentation, or rare scar following cryotherapy. Recommend Vaseline ointment to treated areas while healing.  ? ?If You Need Anything After Your Visit ? ?If you have any questions or concerns for your doctor, please call our main line at [phone] and press option 4 to reach your doctor's medical assistant. If no one answers, please leave a voicemail as directed and we will return your call as soon as possible.  Messages left after 4 pm will be answered the following business day.  ? ?You may also send us a message via MyChart. We typically respond to MyChart messages within 1-2 business days. ? ?For prescription refills, please ask your pharmacy to contact our office. Our fax number is [phone]. ? ?If you have an urgent issue when the clinic is closed that cannot wait until the next business day, you can page your doctor at the number below.   ? ?Please note that while we do our best to be available for urgent issues outside of office hours, we are not available 24/7.  ? ?If you have an urgent issue and are unable to reach us, you may choose to seek medical care at your doctor's office, retail clinic, urgent care center, or emergency room. ? ?If you have a medical emergency, please immediately call 911 or go to the emergency department. ? ?Pager Numbers ? ?- Dr. Kowalski: [phone] ? ?- Dr. Moye: [phone] ? ?- Dr. Stewart: [phone] ? ?In the event of inclement weather, please call our main line at [phone] for an update on the status of any delays or closures. ? ?Dermatology Medication Tips: ?Please keep the boxes that topical medications come in in order to help keep track of the instructions about where and how to use these. Pharmacies typically print the medication instructions only on the boxes and not directly on the medication tubes.  ? ?If your medication is too expensive, please contact our office at [phone] option 4   or send us a message through MyChart.  ? ?We are unable to tell what your co-pay for medications will be in advance as this is different depending on your insurance coverage. However, we may be able to find a substitute medication at lower cost or fill out paperwork to get insurance to cover a needed medication.  ? ?If a prior authorization is required to get your medication covered by your insurance company, please allow us 1-2 business days to complete this process. ? ?Drug  prices often vary depending on where the prescription is filled and some pharmacies may offer cheaper prices. ? ?The website www.goodrx.com contains coupons for medications through different pharmacies. The prices here do not account for what the cost may be with help from insurance (it may be cheaper with your insurance), but the website can give you the price if you did not use any insurance.  ?- You can print the associated coupon and take it with your prescription to the pharmacy.  ?- You may also stop by our office during regular business hours and pick up a GoodRx coupon card.  ?- If you need your prescription sent electronically to a different pharmacy, notify our office through [facility] MyChart or by phone at [phone] option 4. ? ? ? ? ?Si Usted Necesita Algo Despu?s de Su Visita ? ?Tambi?n puede enviarnos un mensaje a trav?s de MyChart. Por lo general respondemos a los mensajes de MyChart en el transcurso de 1 a 2 d?as h?biles. ? ?Para renovar recetas, por favor pida a su farmacia que se ponga en contacto con nuestra oficina. Nuestro n?mero de fax es el [phone]. ? ?Si tiene un asunto urgente cuando la cl?nica est? cerrada y que no puede esperar hasta el siguiente d?a h?bil, puede llamar/localizar a su doctor(a) al n?mero que aparece a continuaci?n.  ? ?Por favor, tenga en cuenta que aunque hacemos todo lo posible para estar disponibles para asuntos urgentes fuera del horario de oficina, no estamos disponibles las 24 horas del d?a, los 7 d?as de la semana.  ? ?Si tiene un problema urgente y no puede comunicarse con nosotros, puede optar por buscar atenci?n m?dica  en el consultorio de su doctor(a), en una cl?nica privada, en un centro de atenci?n urgente o en una sala de emergencias. ? ?Si tiene una emergencia m?dica, por favor llame inmediatamente al 911 o vaya a la sala de emergencias. ? ?N?meros de b?per ? ?- Dr. Kowalski: [phone] ? ?- Dra. Moye: [phone] ? ?- Dra. Stewart:  [phone] ? ?En caso de inclemencias del tiempo, por favor llame a nuestra l?nea principal al [phone] para una actualizaci?n sobre el estado de cualquier retraso o cierre. ? ?Consejos para la medicaci?n en dermatolog?a: ?Por favor, guarde las cajas en las que vienen los medicamentos de uso t?pico para ayudarle a seguir las instrucciones sobre d?nde y c?mo usarlos. Las farmacias generalmente imprimen las instrucciones del medicamento s?lo en las cajas y no directamente en los tubos del medicamento.  ? ?Si su medicamento es muy caro, por favor, p?ngase en contacto con nuestra oficina llamando al [phone] y presione la opci?n 4 o env?enos un mensaje a trav?s de MyChart.  ? ?No podemos decirle cu?l ser? su copago por los medicamentos por adelantado ya que esto es diferente dependiendo de la cobertura de su seguro. Sin embargo, es posible que podamos encontrar un medicamento sustituto a menor costo o llenar un formulario para que el seguro cubra el medicamento que se considera necesario.  ? ?  Si se requiere una autorizaci?n previa para que su compa??a de seguros cubra su medicamento, por favor perm?tanos de 1 a 2 d?as h?biles para completar este proceso. ? ?Los precios de los medicamentos var?an con frecuencia dependiendo del lugar de d?nde se surte la receta y alguna farmacias pueden ofrecer precios m?s baratos. ? ?El sitio web www.goodrx.com tiene cupones para medicamentos de diferentes farmacias. Los precios aqu? no tienen en cuenta lo que podr?a costar con la ayuda del seguro (puede ser m?s barato con su seguro), pero el sitio web puede darle el precio si no utiliz? ning?n seguro.  ?- Puede imprimir el cup?n correspondiente y llevarlo con su receta a la farmacia.  ?- Tambi?n puede pasar por nuestra oficina durante el horario de atenci?n regular y recoger una tarjeta de cupones de GoodRx.  ?- Si necesita que su receta se env?e electr?nicamente a una farmacia diferente, informe a nuestra oficina a trav?s de  MyChart de [facility] o por tel?fono llamando al [phone] y presione la opci?n 4.  ?

## 2021-07-24 ENCOUNTER — Telehealth: Payer: Self-pay

## 2021-07-24 ENCOUNTER — Other Ambulatory Visit: Payer: Self-pay

## 2021-07-24 DIAGNOSIS — I10 Essential (primary) hypertension: Secondary | ICD-10-CM | POA: Diagnosis not present

## 2021-07-24 DIAGNOSIS — I480 Paroxysmal atrial fibrillation: Secondary | ICD-10-CM | POA: Diagnosis not present

## 2021-07-24 DIAGNOSIS — E782 Mixed hyperlipidemia: Secondary | ICD-10-CM | POA: Diagnosis not present

## 2021-07-24 DIAGNOSIS — I2510 Atherosclerotic heart disease of native coronary artery without angina pectoris: Secondary | ICD-10-CM | POA: Diagnosis not present

## 2021-07-24 DIAGNOSIS — I6523 Occlusion and stenosis of bilateral carotid arteries: Secondary | ICD-10-CM | POA: Diagnosis not present

## 2021-07-24 NOTE — Telephone Encounter (Signed)
-----   Message from Ralene Bathe, MD sent at 07/23/2021  6:43 PM EDT ----- ?Diagnosis ?Skin , left sideburn area ?BASAL CELL CARCINOMA, NODULAR AND INFILTRATIVE PATTERNS, BASE INVOLVED ? ?Cancer-BCC ?Already treated ?Recheck next visit. ?

## 2021-07-24 NOTE — Telephone Encounter (Signed)
Patient advised of BX results .aw 

## 2021-07-24 NOTE — Telephone Encounter (Signed)
Left pt VM to return call. aw ?

## 2021-07-25 ENCOUNTER — Telehealth (INDEPENDENT_AMBULATORY_CARE_PROVIDER_SITE_OTHER): Payer: Self-pay

## 2021-07-25 NOTE — Telephone Encounter (Signed)
That's not normal post carotid stent placement especially with radiating to his back. It's possible it is wholly unrelated to his procedure .  We can bring him in earlier for follow up re-evaluate the stent, otherwise he may need to see his PCP

## 2021-07-25 NOTE — Telephone Encounter (Signed)
Blake [name] is making him an appt. And calling the pt ?

## 2021-07-25 NOTE — Telephone Encounter (Signed)
Pt states that he had surgery on 4-6 and everything was good until 3 days ago and has progressively gotten worse.  His pain start under his ear and goes down his neck and radiates around to his back. Pt states he cannot sleep due to the pain and nothing makes the pain go away.  He states he took oxycodone last nite to make him sleep, it helped for about 3 hours.  Please advise.  ?

## 2021-07-26 ENCOUNTER — Other Ambulatory Visit (INDEPENDENT_AMBULATORY_CARE_PROVIDER_SITE_OTHER): Payer: Medicare HMO

## 2021-07-26 ENCOUNTER — Ambulatory Visit (INDEPENDENT_AMBULATORY_CARE_PROVIDER_SITE_OTHER): Payer: Medicare HMO | Admitting: Vascular Surgery

## 2021-07-26 ENCOUNTER — Encounter (INDEPENDENT_AMBULATORY_CARE_PROVIDER_SITE_OTHER): Payer: Self-pay | Admitting: Vascular Surgery

## 2021-07-26 ENCOUNTER — Other Ambulatory Visit (INDEPENDENT_AMBULATORY_CARE_PROVIDER_SITE_OTHER): Payer: Self-pay | Admitting: Vascular Surgery

## 2021-07-26 VITALS — BP 132/72 | HR 72 | Resp 17 | Ht 65.0 in | Wt 148.4 lb

## 2021-07-26 DIAGNOSIS — I6523 Occlusion and stenosis of bilateral carotid arteries: Secondary | ICD-10-CM

## 2021-07-26 DIAGNOSIS — E785 Hyperlipidemia, unspecified: Secondary | ICD-10-CM

## 2021-07-26 DIAGNOSIS — I10 Essential (primary) hypertension: Secondary | ICD-10-CM

## 2021-07-26 NOTE — Assessment & Plan Note (Signed)
blood pressure control important in reducing the progression of atherosclerotic disease. On appropriate oral medications.  

## 2021-07-26 NOTE — Progress Notes (Signed)
? ? ?Patient ID: Blake [name], male   DOB: 21-May-1945, 76 y.o.   MRN: [id number] ? ?No chief complaint on file. ? ? ?HPI ?Blake [name] is a 76 y.o. male.  Patient returns in follow-up about 3 weeks status post right carotid stent placement.  He is having a fair bit of pain in his neck although it is better than it was last week.  Interestingly, this did not really start until about a week and a half postoperatively.  He is otherwise done fine.  No complaints other than that today.  His duplex today shows his right carotid stent to be widely patent with no neck hematoma or other worrisome findings. ? ? ?Past Medical History:  ?Diagnosis Date  ? Arthritis   ? Back pain   ? with leg pain  ? Basal cell carcinoma 09/04/2009  ? R cheek 5.5 cm ant to earlobe - 12/26/2009 excision  ? Basal cell carcinoma 09/04/2009  ? R ant nasal alar rim  ? Basal cell carcinoma 07/31/2014  ? R mid brow  ? Basal cell carcinoma 07/30/2016  ? R ant nasal alar rim at ant edge of BCC scar  ? Basal cell carcinoma 11/02/2019  ? L zygoma   ? Basal cell carcinoma 07/22/2021  ? left sideburn area, EDC  ? 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 s/p chemoradiation  ? 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, bilateral   ? Dyspnea   ? Dysrhythmia   ? Esophageal cancer ([facility]) 2021  ? GERD (gastroesophageal reflux disease)   ? Hyperlipidemia   ? Hypertension   ? Numbness and tingling of both lower extremities   ? with positioning  ? Paroxysmal atrial fibrillation ([facility])   ? Port-A-Cath in place 2006  ? Prostate enlargement   ? 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 L pretibial - ED&C   ? T2DM (type 2 diabetes mellitus) ([facility])   ? ? ?Past Surgical History:  ?Procedure Laterality Date  ? ABDOMINAL SURGERY    ? CAROTID PTA/STENT INTERVENTION Right 07/04/2021  ? Procedure: CAROTID PTA/STENT INTERVENTION;  Surgeon: Algernon Huxley, MD;  Location: [facility];  Service: Cardiovascular;  Laterality: Right;  ? COLONOSCOPY  12/08/2003  ?  COLONOSCOPY WITH PROPOFOL N/A 03/21/2019  ? Procedure: COLONOSCOPY WITH PROPOFOL;  Surgeon: Toledo, Benay Pike, MD;  Location: [facility];  Service: Gastroenterology;  Laterality: N/A;  ? ESOPHAGOGASTRODUODENOSCOPY (EGD) WITH PROPOFOL N/A 03/21/2019  ? Procedure: ESOPHAGOGASTRODUODENOSCOPY (EGD) WITH PROPOFOL;  Surgeon: Toledo, Benay Pike, MD;  Location: [facility];  Service: Gastroenterology;  Laterality: N/A;  ? esophogeal cancer    ? JOINT REPLACEMENT Right   ? total hip  ? LUMBAR LAMINECTOMY/DECOMPRESSION MICRODISCECTOMY N/A 03/03/2018  ? Procedure: LUMBAR LAMINECTOMY/DECOMPRESSION MICRODISCECTOMY 1 LEVEL-L3-4,L4-5;  Surgeon: Meade Maw, MD;  Location: [facility];  Service: Neurosurgery;  Laterality: N/A;  ? LUMBAR LAMINECTOMY/DECOMPRESSION MICRODISCECTOMY N/A 02/24/2020  ? Procedure: LEFT L3-4 MICRODISCECTOMY, L4-5 DECOMPRESSION;  Surgeon: Meade Maw, MD;  Location: [facility];  Service: Neurosurgery;  Laterality: N/A;  ? MAXIMUM ACCESS (MAS) TRANSFORAMINAL LUMBAR INTERBODY FUSION (TLIF) 1 LEVEL N/A 07/13/2020  ? Procedure: OPEN L3-4 TRANSFORAMINAL LUMBAR INTERBODY FUSION (TLIF);  Surgeon: Meade Maw, MD;  Location: [facility];  Service: Neurosurgery;  Laterality: N/A;  ? PARTIAL HIP ARTHROPLASTY Right 2014  ? PORT-A-CATH REMOVAL    ? PORTACATH PLACEMENT Left 01/12/2020  ? Procedure: INSERTION PORT-A-CATH, with ultrasound fluoroscopy;  Surgeon: Nestor Lewandowsky, MD;  Location: [facility];  Service: General;  Laterality: Left;  ? TONGUE BIOPSY  2006  ? TONSILLECTOMY    ? TRIGGER FINGER RELEASE Right 2013  ? UPPER GI ENDOSCOPY  01/25/14  ? multiple gastric polyps  ? ? ? ? ?Allergies  ?Allergen Reactions  ? Sulfa Antibiotics Rash  ? ? ?Current Outpatient Medications  ?Medication Sig Dispense Refill  ? aspirin 81 MG EC tablet Take 1 tablet (81 mg total) by mouth daily at 6 (six) AM. Swallow whole. 30 tablet 11  ? clopidogrel (PLAVIX) 75 MG tablet Take 1 tablet (75 mg total) by mouth daily. 30  tablet 6  ? lisinopril (ZESTRIL) 10 MG tablet TAKE 1 TABLET (10 MG TOTAL) BY MOUTH DAILY. 30 tablet 3  ? methocarbamol (ROBAXIN) 500 MG tablet Take 1 and 1/2 tablets (750 mg total) by mouth every 6 (six) hours as needed for muscle spasms. 90 tablet 1  ? metoprolol succinate (TOPROL-XL) 100 MG 24 hr tablet Take 1.5 tablets (150 mg total) by mouth once daily 135 tablet 1  ? mirtazapine (REMERON) 15 MG tablet Take 1 tablet (15 mg total) by mouth nightly 30 tablet 11  ? omeprazole (PRILOSEC) 40 MG capsule Take 40 mg by mouth 2 (two) times daily.  3  ? tamsulosin (FLOMAX) 0.4 MG CAPS capsule TAKE 1 CAPSULE BY MOUTH ONCE DAILY. TAKE 30 MINUTES AFTER SAME MEAL EACH DAY. 90 capsule 3  ? atorvastatin (LIPITOR) 80 MG tablet TAKE 1 TABLET BY MOUTH ONCE DAILY 90 tablet 3  ? ?No current facility-administered medications for this visit.  ? ? ? ? ? ? ?Physical Exam ?BP 132/72 (BP Location: Right Arm)   Pulse 72   Resp 17   Ht '5\' 5"'$  (1.651 m)   Wt 148 lb 6.4 oz (67.3 kg)   BMI 24.70 kg/m?  ?Gen:  WD/WN, NAD ?Skin: incision C/D/I ?Neuro: Awake, alert, and oriented with normal strength and tone throughout and no focal deficits. ? ? ? ? ?Assessment/Plan: ? ?Bilateral carotid artery stenosis ?His duplex today shows his right carotid stent to be widely patent with no neck hematoma or other worrisome findings.  This was radiation-induced and we will be monitoring this going forward fairly closely.  He is at high risk of recurrence with radiation-induced stenosis.  Return to clinic in 3 months in follow-up.  Continue current medical regimen. ? ?Essential (primary) hypertension ?blood pressure control important in reducing the progression of atherosclerotic disease. On appropriate oral medications. ? ? ?HLD (hyperlipidemia) ?lipid control important in reducing the progression of atherosclerotic disease. Continue statin therapy ? ? ? ? ? ?Leotis Pain ?07/26/2021, 10:07 AM ? ? ?This note was created with Dragon medical transcription  system.  Any errors from dictation are unintentional.    ?

## 2021-07-26 NOTE — Assessment & Plan Note (Signed)
lipid control important in reducing the progression of atherosclerotic disease. Continue statin therapy  

## 2021-07-26 NOTE — Assessment & Plan Note (Signed)
His duplex today shows his right carotid stent to be widely patent with no neck hematoma or other worrisome findings.  This was radiation-induced and we will be monitoring this going forward fairly closely.  He is at high risk of recurrence with radiation-induced stenosis.  Return to clinic in 3 months in follow-up.  Continue current medical regimen. ?

## 2021-07-30 ENCOUNTER — Encounter: Payer: Self-pay | Admitting: Dermatology

## 2021-08-01 ENCOUNTER — Encounter: Payer: Self-pay | Admitting: Pulmonary Disease

## 2021-08-01 ENCOUNTER — Ambulatory Visit: Payer: Medicare HMO | Admitting: Pulmonary Disease

## 2021-08-01 VITALS — BP 130/80 | HR 87 | Temp 97.8°F | Ht 65.0 in | Wt 150.4 lb

## 2021-08-01 DIAGNOSIS — R9389 Abnormal findings on diagnostic imaging of other specified body structures: Secondary | ICD-10-CM

## 2021-08-01 NOTE — Patient Instructions (Signed)
Follow up in 6 months 

## 2021-08-01 NOTE — Progress Notes (Signed)
? ?[facility] ? ?Chief Complaint  ?Patient presents with  ? pulmonary consult  ?  Recent CT--mild sob with exertion.   ? ? ?Past Surgical History:  ?He  has a past surgical history that includes Partial hip arthroplasty (Right, 2014); Tongue Biopsy (2006); Port-a-cath removal; Colonoscopy (12/08/2003); Tonsillectomy; Upper gi endoscopy (01/25/14); Lumbar laminectomy/decompression microdiscectomy (N/A, 03/03/2018); Joint replacement (Right); Trigger finger release (Right, 2013); Colonoscopy with propofol (N/A, 03/21/2019); Esophagogastroduodenoscopy (egd) with propofol (N/A, 03/21/2019); esophogeal cancer; Abdominal surgery; Portacath placement (Left, 01/12/2020); Lumbar laminectomy/decompression microdiscectomy (N/A, 02/24/2020); Maximum access (MAS) transforaminal lumbar interbody fusion (TLIF) 1 level (N/A, 07/13/2020); and CAROTID PTA/STENT INTERVENTION (Right, 07/04/2021). ? 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 ?Constitutional:  ?BP 130/80 (BP Location: Left Arm, Cuff Size: Normal)   Pulse 87   Temp 97.8 ?F (36.6 ?C) (Temporal)   Ht '5\' 5"'$  (1.651 m)   Wt 150 lb 6.4 oz (68.2 kg)   SpO2 95%   BMI 25.03 kg/m?  ? ?Brief Summary:  ?Blake [name] is a 76 y.o. male former smoker with abnormal CT chest. ?  ? ? ? ?Subjective:  ? ?He has history of head and neck, and esophageal cancer.  This was in setting of GERD with Barrett's esophagus.  He was noted to have changes in Rt lung on surveillance CT chest from April 2023.  Findings were concerning for atypical infection.  First noted to have changes like this on CT imaging from 01/17/20.  He was in hospital for aspiration pneumonia at this time.  Subsequent CT imaging showed improvement compared to scan from October 2021, and imaging from April 2022 is stable compared to  2023.   ? ?He is from [place].  Was never in the [employer]  He quit smoking years ago.  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  No history of exposure to tuberculosis.  He has a International aid/development worker.  No family history of lung disease. ? ?He denies cough, wheeze, sputum, chest pain, fever, sweats, weight loss, or hemoptysis.  He gets short of breath sometimes if he exerts himself more than usual, but doesn't feel like his breathing normally limits his activity level.  He reports he sleeps on his right side.  No issues with his teeth.  Has occasional alcohol use.  Denies episodes of loss of consciousness. ? ?Physical Exam:  ? ?Appearance - well kempt  ? ?ENMT - no sinus tenderness, no oral exudate, no LAN, Mallampati 3 airway, no stridor ? ?Respiratory - equal breath sounds bilaterally, no wheezing or rales ? ?CV - s1s2 regular rate and rhythm, no murmurs ? ?Ext - no clubbing, no edema ? ?Skin - no rashes ? ?Psych - normal mood and affect ?  ?Pulmonary testing:  ? ? ?Chest Imaging:  ?CT chest 06/29/21 >> clustered centrilobular and tree in bud nodularity Rt lung with areas of peripheral consolidation, scarring and volume loss Rt hemithorax, mild paraseptal emphysema ? ?Cardiac Tests:  ?Echo 02/07/20 >> mild LVH, EF 55 to 60% ? ?Social History:  ?He  reports that he quit smoking about 27 years ago. His smoking use included cigarettes. He has a 30.00 pack-year smoking history. He has never used smokeless tobacco. He reports that he does not currently use alcohol. He reports that he does not use drugs. ? ?Family History:  ?  His family history includes Breast cancer in his mother; Congestive Heart Failure in his mother; Diabetes in his mother; Lung cancer in his father. ?  ? ?Discussion:  ?He was hospitalized for aspiration pneumonia in the right lung in October 2021.  This is when nodular and tree in bud changes were first noted in his right lung.  He has persistent changes in right lung since albeit imaging  has improved since 2021 and has been stable from 2022 to 2023.  He does not currently have any significant respiratory symptoms.  I suspect that his radiographic changes represent sequela from aspiration pneumonia in 2021 rather than atypical infectious process. ? ?Assessment/Plan:  ? ?Abnormal CT chest. ?- he will have follow up CT imaging through oncology later this year ?- will monitor clinically for now ?- if he develops symptoms suggestive of a respiratory infection or has progressive changes on CT imaging, then he will need bronchoscopy with airway sampling ? ?Esophageal adenocarcinoma stage 2b. ?- s/p esophagectomy with adjuvant chemotherapy ?- followed by Dr. Earlie Server with oncology ? ?Paroxysmal atrial fibrillation, Coronary artery disease. ?- followed by Dr. Serafina Royals with [facility] ? ?Time Spent Involved in Patient Care on Day of Examination:  ?51 minutes ? ?Follow up:  ? ?Patient Instructions  ?Follow up in 6 months ? ?Medication List:  ? ?Allergies as of 08/01/2021   ? ?   Reactions  ? Sulfa Antibiotics Rash  ? ?  ? ?  ?Medication List  ?  ? ?  ? Accurate as of Aug 01, 2021 10:07 AM. If you have any questions, ask your nurse or doctor.  ?  ?  ? ?  ? ?aspirin 81 MG EC tablet ?Take 1 tablet (81 mg total) by mouth daily at 6 (six) AM. Swallow whole. ?  ?atorvastatin 80 MG tablet ?Commonly known as: LIPITOR ?TAKE 1 TABLET BY MOUTH ONCE DAILY ?  ?clopidogrel 75 MG tablet ?Commonly known as: PLAVIX ?Take 1 tablet (75 mg total) by mouth daily. ?  ?lisinopril 10 MG tablet ?Commonly known as: ZESTRIL ?TAKE 1 TABLET (10 MG TOTAL) BY MOUTH DAILY. ?  ?methocarbamol 500 MG tablet ?Commonly known as: ROBAXIN ?Take 1 and 1/2 tablets (750 mg total) by mouth every 6 (six) hours as needed for muscle spasms. ?  ?metoprolol succinate 100 MG 24 hr tablet ?Commonly known as: TOPROL-XL ?Take 1 & 1/2 tablets ('150mg'$ ) by mouth once daily ?(Take 1.5 tablets (150 mg total) by mouth once daily) ?  ?mirtazapine 15 MG  tablet ?Commonly known as: REMERON ?Take 1 tablet (15 mg total) by mouth nightly ?  ?omeprazole 40 MG capsule ?Commonly known as: PRILOSEC ?Take 40 mg by mouth 2 (two) times daily. ?  ?tamsulosin 0.4 MG Caps capsule ?Commonly known as: FLOMAX ?TAKE 1 CAPSULE BY MOUTH ONCE DAILY. TAKE 30 MINUTES AFTER SAME MEAL EACH DAY. ?  ? ?  ? ? ?Signature:  ?Chesley Mires, MD ?[facility] ?Pager - [phone] ?08/01/2021, 10:07 AM ?  ? ? ? ? ? ? ? ? ?

## 2021-08-02 ENCOUNTER — Ambulatory Visit (INDEPENDENT_AMBULATORY_CARE_PROVIDER_SITE_OTHER): Payer: Medicare HMO | Admitting: Vascular Surgery

## 2021-08-06 ENCOUNTER — Other Ambulatory Visit: Payer: Self-pay

## 2021-08-08 ENCOUNTER — Other Ambulatory Visit: Payer: Self-pay

## 2021-08-09 ENCOUNTER — Other Ambulatory Visit: Payer: Self-pay

## 2021-08-16 DIAGNOSIS — Z79899 Other long term (current) drug therapy: Secondary | ICD-10-CM | POA: Diagnosis not present

## 2021-08-16 DIAGNOSIS — Z125 Encounter for screening for malignant neoplasm of prostate: Secondary | ICD-10-CM | POA: Diagnosis not present

## 2021-08-16 DIAGNOSIS — I480 Paroxysmal atrial fibrillation: Secondary | ICD-10-CM | POA: Diagnosis not present

## 2021-08-16 DIAGNOSIS — E119 Type 2 diabetes mellitus without complications: Secondary | ICD-10-CM | POA: Diagnosis not present

## 2021-08-16 DIAGNOSIS — I10 Essential (primary) hypertension: Secondary | ICD-10-CM | POA: Diagnosis not present

## 2021-08-16 DIAGNOSIS — Z0000 Encounter for general adult medical examination without abnormal findings: Secondary | ICD-10-CM | POA: Diagnosis not present

## 2021-08-16 DIAGNOSIS — E785 Hyperlipidemia, unspecified: Secondary | ICD-10-CM | POA: Diagnosis not present

## 2021-08-16 DIAGNOSIS — I2510 Atherosclerotic heart disease of native coronary artery without angina pectoris: Secondary | ICD-10-CM | POA: Diagnosis not present

## 2021-08-20 DIAGNOSIS — H5203 Hypermetropia, bilateral: Secondary | ICD-10-CM | POA: Diagnosis not present

## 2021-08-20 DIAGNOSIS — H2513 Age-related nuclear cataract, bilateral: Secondary | ICD-10-CM | POA: Diagnosis not present

## 2021-08-20 DIAGNOSIS — E119 Type 2 diabetes mellitus without complications: Secondary | ICD-10-CM | POA: Diagnosis not present

## 2021-08-20 DIAGNOSIS — H0288A Meibomian gland dysfunction right eye, upper and lower eyelids: Secondary | ICD-10-CM | POA: Diagnosis not present

## 2021-08-20 DIAGNOSIS — H0288B Meibomian gland dysfunction left eye, upper and lower eyelids: Secondary | ICD-10-CM | POA: Diagnosis not present

## 2021-08-20 DIAGNOSIS — H524 Presbyopia: Secondary | ICD-10-CM | POA: Diagnosis not present

## 2021-08-20 DIAGNOSIS — H25013 Cortical age-related cataract, bilateral: Secondary | ICD-10-CM | POA: Diagnosis not present

## 2021-08-21 ENCOUNTER — Other Ambulatory Visit: Payer: Self-pay

## 2021-08-22 DIAGNOSIS — Z0100 Encounter for examination of eyes and vision without abnormal findings: Secondary | ICD-10-CM | POA: Diagnosis not present

## 2021-08-30 DIAGNOSIS — E782 Mixed hyperlipidemia: Secondary | ICD-10-CM | POA: Diagnosis not present

## 2021-08-30 DIAGNOSIS — Z79899 Other long term (current) drug therapy: Secondary | ICD-10-CM | POA: Diagnosis not present

## 2021-08-30 DIAGNOSIS — I10 Essential (primary) hypertension: Secondary | ICD-10-CM | POA: Diagnosis not present

## 2021-08-30 DIAGNOSIS — Z125 Encounter for screening for malignant neoplasm of prostate: Secondary | ICD-10-CM | POA: Diagnosis not present

## 2021-08-30 DIAGNOSIS — E118 Type 2 diabetes mellitus with unspecified complications: Secondary | ICD-10-CM | POA: Diagnosis not present

## 2021-09-02 ENCOUNTER — Ambulatory Visit
Payer: Medicare HMO | Attending: Student in an Organized Health Care Education/Training Program | Admitting: Student in an Organized Health Care Education/Training Program

## 2021-09-02 ENCOUNTER — Ambulatory Visit: Admission: RE | Admit: 2021-09-02 | Payer: Medicare HMO | Source: Ambulatory Visit

## 2021-09-02 ENCOUNTER — Encounter: Payer: Self-pay | Admitting: Student in an Organized Health Care Education/Training Program

## 2021-09-02 VITALS — BP 141/56 | Temp 98.2°F | Ht 65.0 in | Wt 150.0 lb

## 2021-09-02 DIAGNOSIS — M48062 Spinal stenosis, lumbar region with neurogenic claudication: Secondary | ICD-10-CM

## 2021-09-02 DIAGNOSIS — M5416 Radiculopathy, lumbar region: Secondary | ICD-10-CM

## 2021-09-02 DIAGNOSIS — G894 Chronic pain syndrome: Secondary | ICD-10-CM

## 2021-09-02 MED ORDER — IOHEXOL 180 MG/ML  SOLN: 10.0000 mL | Freq: Once | INTRAMUSCULAR | Status: DC

## 2021-09-02 MED ORDER — DEXAMETHASONE SODIUM PHOSPHATE 10 MG/ML IJ SOLN: 10.0000 mg | Freq: Once | INTRAMUSCULAR | Status: DC

## 2021-09-02 MED ORDER — LIDOCAINE HCL 2 % IJ SOLN: 20.0000 mL | Freq: Once | INTRAMUSCULAR | Status: DC

## 2021-09-02 MED ORDER — ROPIVACAINE HCL 2 MG/ML IJ SOLN: 2.0000 mL | Freq: Once | INTRAMUSCULAR | Status: DC

## 2021-09-02 MED ORDER — SODIUM CHLORIDE 0.9% FLUSH
2.0000 mL | Freq: Once | INTRAVENOUS | Status: DC
Start: 2021-09-02 — End: 2021-09-02

## 2021-09-02 NOTE — Progress Notes (Signed)
Safety precautions to be maintained throughout the outpatient stay will include: orient to surroundings, keep bed in low position, maintain call bell within reach at all times, provide assistance with transfer out of bed and ambulation.  

## 2021-09-02 NOTE — Progress Notes (Signed)
Patient forgot to discontinue his Plavix 7 days prior to scheduled procedure.  We will need to reschedule and he was given instructions about stopping his Plavix 7 days prior, okay to continue 81 mg.

## 2021-09-03 ENCOUNTER — Telehealth: Payer: Self-pay | Admitting: *Deleted

## 2021-09-03 DIAGNOSIS — M65321 Trigger finger, right index finger: Secondary | ICD-10-CM | POA: Diagnosis not present

## 2021-09-03 NOTE — Telephone Encounter (Signed)
Attempted to call for post procedure follow-up. Message left. 

## 2021-09-06 ENCOUNTER — Other Ambulatory Visit: Payer: Self-pay

## 2021-09-06 MED ORDER — LISINOPRIL 10 MG PO TABS
ORAL_TABLET | Freq: Every day | ORAL | 3 refills | Status: DC
  Filled 2021-09-06: qty 30, 30d supply, fill #0
  Filled 2021-10-10: qty 30, 30d supply, fill #1
  Filled 2021-11-12: qty 30, 30d supply, fill #2
  Filled 2021-12-09: qty 30, 30d supply, fill #3

## 2021-09-06 MED ORDER — METOPROLOL SUCCINATE ER 100 MG PO TB24
ORAL_TABLET | ORAL | 1 refills | Status: DC
  Filled 2021-09-06: qty 135, 90d supply, fill #0
  Filled 2021-12-09: qty 135, 90d supply, fill #1

## 2021-09-09 ENCOUNTER — Ambulatory Visit
Admission: RE | Admit: 2021-09-09 | Discharge: 2021-09-09 | Disposition: A | Discharge status: Home / Self Care | Payer: Medicare HMO | Source: Ambulatory Visit | Attending: Student in an Organized Health Care Education/Training Program | Admitting: Student in an Organized Health Care Education/Training Program

## 2021-09-09 ENCOUNTER — Encounter: Payer: Self-pay | Admitting: Student in an Organized Health Care Education/Training Program

## 2021-09-09 ENCOUNTER — Ambulatory Visit
Payer: Medicare HMO | Attending: Student in an Organized Health Care Education/Training Program | Admitting: Student in an Organized Health Care Education/Training Program

## 2021-09-09 VITALS — BP 166/81 | HR 76 | Temp 98.1°F | Resp 15 | Ht 65.0 in | Wt 150.0 lb

## 2021-09-09 DIAGNOSIS — M48062 Spinal stenosis, lumbar region with neurogenic claudication: Secondary | ICD-10-CM | POA: Insufficient documentation

## 2021-09-09 DIAGNOSIS — G894 Chronic pain syndrome: Secondary | ICD-10-CM | POA: Insufficient documentation

## 2021-09-09 DIAGNOSIS — M5416 Radiculopathy, lumbar region: Secondary | ICD-10-CM | POA: Diagnosis not present

## 2021-09-09 MED ORDER — DEXAMETHASONE SODIUM PHOSPHATE 10 MG/ML IJ SOLN
10.0000 mg | Freq: Once | INTRAMUSCULAR | Status: AC
Start: 2021-09-09 — End: 2021-09-09
  Administered 2021-09-09: 10 mg

## 2021-09-09 MED ORDER — LIDOCAINE HCL 2 % IJ SOLN
INTRAMUSCULAR | Status: AC
  Filled 2021-09-09: qty 20

## 2021-09-09 MED ORDER — SODIUM CHLORIDE (PF) 0.9 % IJ SOLN
INTRAMUSCULAR | Status: AC
  Filled 2021-09-09: qty 10

## 2021-09-09 MED ORDER — IOHEXOL 180 MG/ML  SOLN
10.0000 mL | Freq: Once | INTRAMUSCULAR | Status: AC
Start: 2021-09-09 — End: 2021-09-09
  Administered 2021-09-09: 10 mL via EPIDURAL
  Filled 2021-09-09: qty 20

## 2021-09-09 MED ORDER — ROPIVACAINE HCL 2 MG/ML IJ SOLN
INTRAMUSCULAR | Status: AC
  Filled 2021-09-09: qty 20

## 2021-09-09 MED ORDER — SODIUM CHLORIDE 0.9% FLUSH
2.0000 mL | Freq: Once | INTRAVENOUS | Status: AC
Start: 2021-09-09 — End: 2021-09-09
  Administered 2021-09-09: 2 mL

## 2021-09-09 MED ORDER — LIDOCAINE HCL 2 % IJ SOLN
20.0000 mL | Freq: Once | INTRAMUSCULAR | Status: AC
Start: 2021-09-09 — End: 2021-09-09
  Administered 2021-09-09: 400 mg

## 2021-09-09 MED ORDER — DEXAMETHASONE SODIUM PHOSPHATE 10 MG/ML IJ SOLN
INTRAMUSCULAR | Status: AC
  Filled 2021-09-09: qty 1

## 2021-09-09 MED ORDER — ROPIVACAINE HCL 2 MG/ML IJ SOLN
2.0000 mL | Freq: Once | INTRAMUSCULAR | Status: AC
  Administered 2021-09-09: 2 mL via EPIDURAL

## 2021-09-09 NOTE — Progress Notes (Signed)
PROVIDER NOTE: Information contained herein reflects review and annotations entered in association with encounter. Interpretation of such information and data should be left to medically-trained personnel. Information provided to patient can be located elsewhere in the medical record under "Patient Instructions". Document created using STT-dictation technology, any transcriptional errors that may result from process are unintentional.    Patient: Blake [name]  Service Category: Procedure  Provider: Gillis Santa, MD  DOB: October 19, 1945  DOS: 09/09/2021  Location: [facility]  MRN: [id number]  Setting: Ambulatory - outpatient  Referring Provider: Idelle Crouch, MD  Type: Established Patient  Specialty: Interventional Pain Management  PCP: Idelle Crouch, MD   Primary Reason for Visit: Interventional Pain Management Treatment. CC: Back Pain (lower)  Procedure:          Anesthesia, Analgesia, Anxiolysis:  Type: Therapeutic Epidural Steroid Injection #2 for 2023 (#1 done 05/20/2021) Region: Caudal Level: Sacrococcygeal   Laterality: Midline     Type: Local Anesthesia  Local Anesthetic: Lidocaine 1-2%  Position: Prone     Indications: 1. Lumbar radiculopathy   2. Spinal stenosis, lumbar region, with neurogenic claudication   3. Chronic pain syndrome     Patient stopped his Plavix 7 days prior.  Pain Score: Pre-procedure: 5 /10 Post-procedure: 0-No pain/10   Pre-op H&P Assessment:  Blake [name] is a 76 y.o. (year old), male patient, seen today for interventional treatment. He  has a past surgical history that includes Partial hip arthroplasty (Right, 2014); Tongue Biopsy (2006); Port-a-cath removal; Colonoscopy (12/08/2003); Tonsillectomy; Upper gi endoscopy (01/25/14); Lumbar laminectomy/decompression microdiscectomy (N/A, 03/03/2018); Joint replacement (Right); Trigger finger release (Right, 2013); Colonoscopy with propofol (N/A, 03/21/2019); Esophagogastroduodenoscopy  (egd) with propofol (N/A, 03/21/2019); esophogeal cancer; Abdominal surgery; Portacath placement (Left, 01/12/2020); Lumbar laminectomy/decompression microdiscectomy (N/A, 02/24/2020); Maximum access (MAS) transforaminal lumbar interbody fusion (TLIF) 1 level (N/A, 07/13/2020); and CAROTID PTA/STENT INTERVENTION (Right, 07/04/2021). Blake [name] has a current medication list which includes the following prescription(s): clopidogrel, lisinopril, methocarbamol, metoprolol succinate, mirtazapine, omeprazole, tamsulosin, aspirin ec, atorvastatin, and [DISCONTINUED] prochlorperazine. His primarily concern today is the Back Pain (lower)  Initial Vital Signs:  Pulse/HCG Rate: 76ECG Heart Rate: 70 Temp: 98.1 F (36.7 C) Resp: 16 BP: (!) 154/68 SpO2: 100 %  BMI: Estimated body mass index is 24.96 kg/m as calculated from the following:   Height as of this encounter: '5\' 5"'$  (1.651 m).   Weight as of this encounter: 150 lb (68 kg).  Risk Assessment: Allergies: Reviewed. He is allergic to sulfa antibiotics.  Allergy Precautions: None required Coagulopathies: Reviewed. None identified.  Blood-thinner therapy: None at this time Active Infection(s): Reviewed. None identified. Blake [name] is afebrile  Site Confirmation: Blake [name] was asked to confirm the procedure and laterality before marking the site Procedure checklist: Completed Consent: Before the procedure and under the influence of no sedative(s), amnesic(s), or anxiolytics, the patient was informed of the treatment options, risks and possible complications. To fulfill our ethical and legal obligations, as recommended by the American Medical Association's Code of Ethics, I have informed the patient of my clinical impression; the nature and purpose of the treatment or procedure; the risks, benefits, and possible complications of the intervention; the alternatives, including doing nothing; the risk(s) and benefit(s) of the alternative treatment(s) or  procedure(s); and the risk(s) and benefit(s) of doing nothing. The patient was provided information about the general risks and possible complications associated with the procedure. These may include, but are not limited to: failure to achieve desired goals, infection, bleeding,  organ or nerve damage, allergic reactions, paralysis, and death. In addition, the patient was informed of those risks and complications associated to Spine-related procedures, such as failure to decrease pain; infection (i.e.: Meningitis, epidural or intraspinal abscess); bleeding (i.e.: epidural hematoma, subarachnoid hemorrhage, or any other type of intraspinal or peri-dural bleeding); organ or nerve damage (i.e.: Any type of peripheral nerve, nerve root, or spinal cord injury) with subsequent damage to sensory, motor, and/or autonomic systems, resulting in permanent pain, numbness, and/or weakness of one or several areas of the body; allergic reactions; (i.e.: anaphylactic reaction); and/or death. Furthermore, the patient was informed of those risks and complications associated with the medications. These include, but are not limited to: allergic reactions (i.e.: anaphylactic or anaphylactoid reaction(s)); adrenal axis suppression; blood sugar elevation that in diabetics may result in ketoacidosis or comma; water retention that in patients with history of congestive heart failure may result in shortness of breath, pulmonary edema, and decompensation with resultant heart failure; weight gain; swelling or edema; medication-induced neural toxicity; particulate matter embolism and blood vessel occlusion with resultant organ, and/or nervous system infarction; and/or aseptic necrosis of one or more joints. Finally, the patient was informed that Medicine is not an exact science; therefore, there is also the possibility of unforeseen or unpredictable risks and/or possible complications that may result in a catastrophic outcome. The patient  indicated having understood very clearly. We have given the patient no guarantees and we have made no promises. Enough time was given to the patient to ask questions, all of which were answered to the patient's satisfaction. Mr. Freer has indicated that he wanted to continue with the procedure. Attestation: I, the ordering provider, attest that I have discussed with the patient the benefits, risks, side-effects, alternatives, likelihood of achieving goals, and potential problems during recovery for the procedure that I have provided informed consent. Date  Time: 09/09/2021  8:43 AM  Pre-Procedure Preparation:  Monitoring: As per clinic protocol. Respiration, ETCO2, SpO2, BP, heart rate and rhythm monitor placed and checked for adequate function Safety Precautions: Patient was assessed for positional comfort and pressure points before starting the procedure. Time-out: I initiated and conducted the "Time-out" before starting the procedure, as per protocol. The patient was asked to participate by confirming the accuracy of the "Time Out" information. Verification of the correct person, site, and procedure were performed and confirmed by me, the nursing staff, and the patient. "Time-out" conducted as per Joint Commission's Universal Protocol (UP.01.01.01). Time: 1009  Description of Procedure:          Target Area: Caudal Epidural Canal. Approach: Midline approach. Area Prepped: Entire Posterior Sacrococcygeal Region DuraPrep (Iodine Povacrylex [0.7% available iodine] and Isopropyl Alcohol, 74% w/w) Safety Precautions: Aspiration looking for blood return was conducted prior to all injections. At no point did we inject any substances, as a needle was being advanced. No attempts were made at seeking any paresthesias. Safe injection practices and needle disposal techniques used. Medications properly checked for expiration dates. SDV (single dose vial) medications used. Description of the Procedure: Protocol  guidelines were followed. The patient was placed in position over the fluoroscopy table. The target area was identified and the area prepped in the usual manner. Skin & deeper tissues infiltrated with local anesthetic. Appropriate amount of time allowed to pass for local anesthetics to take effect. The procedure needles were then advanced to the target area. Proper needle placement secured. Negative aspiration confirmed. Solution injected in intermittent fashion, asking for systemic symptoms every 0.5cc of injectate.  The needles were then removed and the area cleansed, making sure to leave some of the prepping solution back to take advantage of its long term bactericidal properties. Vitals:   09/09/21 0851 09/09/21 1005 09/09/21 1008 09/09/21 1013  BP: (!) 154/68 (!) 161/67 (!) 170/75 (!) 166/81  Pulse: 76     Resp: '16 18 14 15  '$ Temp: 98.1 F (36.7 C)     TempSrc: Temporal     SpO2: 100% 99% 99% 100%  Weight: 150 lb (68 kg)     Height: '5\' 5"'$  (1.651 m)        Start Time: 1009 hrs. End Time: 1013 hrs. Materials: 3 Needle(s) Type: Epidural needle Gauge: 22G Length: 3.5-in Medication(s): Please see orders for medications and dosing details. 6 cc solution made of 3 cc of preservative-free saline, 2 cc of 0.2% ropivacaine, 1 cc of Decadron 10 mg/cc.  Imaging Guidance (Spinal):          Type of Imaging Technique: Fluoroscopy Guidance (Spinal) Indication(s): Assistance in needle guidance and placement for procedures requiring needle placement in or near specific anatomical locations not easily accessible without such assistance. Exposure Time: Please see nurses notes. Contrast: Before injecting any contrast, we confirmed that the patient did not have an allergy to iodine, shellfish, or radiological contrast. Once satisfactory needle placement was completed at the desired level, radiological contrast was injected. Contrast injected under live fluoroscopy. No contrast complications. See chart for  type and volume of contrast used. Fluoroscopic Guidance: I was personally present during the use of fluoroscopy. "Tunnel Vision Technique" used to obtain the best possible view of the target area. Parallax error corrected before commencing the procedure. "Direction-depth-direction" technique used to introduce the needle under continuous pulsed fluoroscopy. Once target was reached, antero-posterior, oblique, and lateral fluoroscopic projection used confirm needle placement in all planes. Images permanently stored in EMR. Interpretation: I personally interpreted the imaging intraoperatively. Adequate needle placement confirmed in multiple planes. Appropriate spread of contrast into desired area was observed. No evidence of afferent or efferent intravascular uptake. No intrathecal or subarachnoid spread observed. Permanent images saved into the patient's record.   Post-operative Assessment:  Post-procedure Vital Signs:  Pulse/HCG Rate: 7671 Temp: 98.1 F (36.7 C) Resp: 15 BP: (!) 166/81 SpO2: 100 %  EBL: None  Complications: No immediate post-treatment complications observed by team, or reported by patient.  Note: The patient tolerated the entire procedure well. A repeat set of vitals were taken after the procedure and the patient was kept under observation following institutional policy, for this type of procedure. Post-procedural neurological assessment was performed, showing return to baseline, prior to discharge. The patient was provided with post-procedure discharge instructions, including a section on how to identify potential problems. Should any problems arise concerning this procedure, the patient was given instructions to immediately contact us, at any time, without hesitation. In any case, we plan to contact the patient by telephone for a follow-up status report regarding this interventional procedure.  Comments:  No additional relevant information.  5 out of 5 strength bilateral lower  extremity: Plantar flexion, dorsiflexion, knee flexion, knee extension.   Plan of Care  Orders:  Orders Placed This Encounter  Procedures   DG PAIN CLINIC C-ARM 1-60 MIN NO REPORT    Intraoperative interpretation by procedural physician at [place].    Standing Status:   Standing    Number of Occurrences:   1    Order Specific Question:   Reason for exam:  Answer:   Assistance in needle guidance and placement for procedures requiring needle placement in or near specific anatomical locations not easily accessible without such assistance.   Medications ordered for procedure: Meds ordered this encounter  Medications   iohexol (OMNIPAQUE) 180 MG/ML injection 10 mL    Must be Myelogram-compatible. If not available, you may substitute with a water-soluble, non-ionic, hypoallergenic, myelogram-compatible radiological contrast medium.   lidocaine (XYLOCAINE) 2 % (with pres) injection 400 mg   sodium chloride flush (NS) 0.9 % injection 2 mL   ropivacaine (PF) 2 mg/mL (0.2%) (NAROPIN) injection 2 mL   dexamethasone (DECADRON) injection 10 mg   Medications administered: We administered iohexol, lidocaine, sodium chloride flush, ropivacaine (PF) 2 mg/mL (0.2%), and dexamethasone.  See the medical record for exact dosing, route, and time of administration.  Patient instructed to restart his Plavix tomorrow so long as he is not having any issues.  Follow-up plan:   Return in about 4 weeks (around 10/07/2021) for Post Procedure Evaluation, virtual.     Recent Visits Date Type Provider Dept  06/18/21 Office Visit Gillis Santa, MD [facility]  Showing recent visits within past 90 days and meeting all other requirements Today's Visits Date Type Provider Dept  09/09/21 Procedure visit Gillis Santa, MD [facility]  Showing today's visits and meeting all other requirements Future Appointments Date Type Provider Dept  10/08/21 Appointment Gillis Santa, MD  [facility]  Showing future appointments within next 90 days and meeting all other requirements  Disposition: Discharge home  Discharge (Date  Time): 09/09/2021; 1022 hrs.   Primary Care Physician: Idelle Crouch, MD Location: [facility] Note by: Gillis Santa, MD Date: 09/09/2021; Time: 11:29 AM  Disclaimer:  Medicine is not an exact science. The only guarantee in medicine is that nothing is guaranteed. It is important to note that the decision to proceed with this intervention was based on the information collected from the patient. The Data and conclusions were drawn from the patient's questionnaire, the interview, and the physical examination. Because the information was provided in large part by the patient, it cannot be guaranteed that it has not been purposely or unconsciously manipulated. Every effort has been made to obtain as much relevant data as possible for this evaluation. It is important to note that the conclusions that lead to this procedure are derived in large part from the available data. Always take into account that the treatment will also be dependent on availability of resources and existing treatment guidelines, considered by other Pain Management Practitioners as being common knowledge and practice, at the time of the intervention. For Medico-Legal purposes, it is also important to point out that variation in procedural techniques and pharmacological choices are the acceptable norm. The indications, contraindications, technique, and results of the above procedure should only be interpreted and judged by a Board-Certified Interventional Pain Specialist with extensive familiarity and expertise in the same exact procedure and technique.

## 2021-09-09 NOTE — Patient Instructions (Signed)

## 2021-09-10 ENCOUNTER — Telehealth: Payer: Self-pay

## 2021-09-10 NOTE — Telephone Encounter (Signed)
Post procedure phone call.  LM 

## 2021-09-12 ENCOUNTER — Inpatient Hospital Stay: Payer: Medicare HMO | Attending: Oncology

## 2021-09-12 DIAGNOSIS — C155 Malignant neoplasm of lower third of esophagus: Secondary | ICD-10-CM | POA: Diagnosis not present

## 2021-09-12 DIAGNOSIS — Z95828 Presence of other vascular implants and grafts: Secondary | ICD-10-CM

## 2021-09-12 DIAGNOSIS — Z452 Encounter for adjustment and management of vascular access device: Secondary | ICD-10-CM | POA: Diagnosis not present

## 2021-09-12 MED ORDER — HEPARIN SOD (PORK) LOCK FLUSH 100 UNIT/ML IV SOLN
500.0000 [IU] | Freq: Once | INTRAVENOUS | Status: AC
  Administered 2021-09-12: 500 [IU] via INTRAVENOUS
  Filled 2021-09-12: qty 5

## 2021-09-12 MED ORDER — SODIUM CHLORIDE 0.9% FLUSH
10.0000 mL | Freq: Once | INTRAVENOUS | Status: AC
  Administered 2021-09-12: 10 mL via INTRAVENOUS
  Filled 2021-09-12: qty 10

## 2021-09-23 ENCOUNTER — Other Ambulatory Visit: Payer: Self-pay

## 2021-09-23 MED ORDER — OMEPRAZOLE 40 MG PO CPDR
DELAYED_RELEASE_CAPSULE | ORAL | 3 refills | Status: DC
Start: 2021-09-23 — End: 2022-10-09
  Filled 2021-09-23: qty 90, 90d supply, fill #0
  Filled 2021-12-09: qty 90, 90d supply, fill #1
  Filled 2022-03-27: qty 90, 90d supply, fill #2
  Filled 2022-06-25: qty 90, 90d supply, fill #3

## 2021-09-26 ENCOUNTER — Other Ambulatory Visit: Payer: Self-pay

## 2021-10-08 ENCOUNTER — Ambulatory Visit
Payer: Medicare HMO | Attending: Student in an Organized Health Care Education/Training Program | Admitting: Student in an Organized Health Care Education/Training Program

## 2021-10-08 ENCOUNTER — Encounter: Payer: Self-pay | Admitting: Student in an Organized Health Care Education/Training Program

## 2021-10-08 DIAGNOSIS — M5416 Radiculopathy, lumbar region: Secondary | ICD-10-CM

## 2021-10-08 DIAGNOSIS — G894 Chronic pain syndrome: Secondary | ICD-10-CM | POA: Diagnosis not present

## 2021-10-08 DIAGNOSIS — M48062 Spinal stenosis, lumbar region with neurogenic claudication: Secondary | ICD-10-CM

## 2021-10-08 NOTE — Progress Notes (Signed)
Patient: Blake [name]  Service Category: E/M  Provider: Gillis Santa, MD  DOB: [birth year]  DOS: 10/08/2021  Location: Office  MRN: [id number]  Setting: Ambulatory outpatient  Referring Provider: Idelle Crouch, MD  Type: Established Patient  Specialty: Interventional Pain Management  PCP: Blake Crouch, MD  Location: Remote location  Delivery: TeleHealth     Virtual Encounter - Pain Management PROVIDER NOTE: Information contained herein reflects review and annotations entered in association with encounter. Interpretation of such information and data should be left to medically-trained personnel. Information provided to patient can be located elsewhere in the medical record under "Patient Instructions". Document created using STT-dictation technology, any transcriptional errors that may result from process are unintentional.    Contact & Pharmacy Preferred: [phone] Home: [phone] (home) Mobile: [phone] (mobile) E-mail: Blake [name]  Blake [name] Blake [name], Blake [name] Phone: [phone] Fax: [phone]  [place] [address] Phone: [phone] Fax: [place], [place] [address] [place] [address] Phone: [phone] Fax: [place]. [place] [address] Phone: [phone] Fax: [phone]   Pre-screening  Blake [name] offered "in-person" vs "virtual" encounter. He indicated preferring virtual for this encounter.   Reason COVID-19*  Social distancing based on CDC and AMA recommendations.   I contacted Blake [name] on 10/08/2021 via telephone.      I clearly identified myself as Blake Santa, MD. I verified that I was speaking with the correct person using two identifiers (Name: Blake [name], and  date of birth: Blake [name], Blake [name]).  Consent I sought verbal advanced consent from Blake [name] for virtual visit interactions. I informed Blake [name] of possible security and privacy concerns, risks, and limitations associated with providing "not-in-person" medical evaluation and management services. I also informed Blake [name] of the availability of "in-person" appointments. Finally, I informed him that there would be a charge for the virtual visit and that he could be  personally, fully or partially, financially responsible for it. Blake [name] expressed understanding and agreed to proceed.   Historic Elements   Blake [name] is a 76 y.o. year old, male patient evaluated today after our last contact on 09/09/2021. 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 He also  has a past surgical history that includes Partial hip arthroplasty (Right, 2014); Tongue Biopsy (2006); Port-a-cath removal; Colonoscopy (12/08/2003); Tonsillectomy; Upper gi endoscopy (01/25/14); Lumbar laminectomy/decompression microdiscectomy (N/A, 03/03/2018); Joint replacement (Right); Trigger finger release (Right, 2013); Colonoscopy with propofol (N/A, 03/21/2019); Esophagogastroduodenoscopy (egd) with propofol (N/A, 03/21/2019); esophogeal cancer; Abdominal surgery; Portacath  placement (Left, 01/12/2020); Lumbar laminectomy/decompression microdiscectomy (N/A, 02/24/2020); Maximum access (MAS) transforaminal lumbar interbody fusion (TLIF) 1 level (N/A, 07/13/2020); and CAROTID PTA/STENT INTERVENTION (Right, 07/04/2021). Blake [name] has a current medication list which includes the following prescription(s): aspirin ec, clopidogrel, lisinopril, methocarbamol, metoprolol succinate, mirtazapine, omeprazole, omeprazole,  tamsulosin, atorvastatin, and [DISCONTINUED] prochlorperazine. He  reports that he quit smoking about 27 years ago. His smoking use included cigarettes. He has a 30.00 pack-year smoking history. He has never used smokeless tobacco. He reports that he does not currently use alcohol. He reports that he does not use drugs. Blake [name] is allergic to sulfa antibiotics.   HPI  Today, he is being contacted for a post-procedure assessment.   Post-procedure evaluation   Type: Therapeutic Epidural Steroid Injection #2 for 2023 (#1 done 05/20/2021) Region: Caudal Level: Sacrococcygeal   Laterality: Midline     Effectiveness:  Initial hour after procedure: 100 %  Subsequent 4-6 hours post-procedure: 100 %  Analgesia past initial 6 hours: 35 % (ongoing)  Ongoing improvement:  Analgesic:  35% pain relief during activity, 100% pain relief at rest Function: Somewhat improved ROM: Somewhat improved   Laboratory Chemistry Profile   Renal Lab Results  Component Value Date   BUN 18 07/05/2021   CREATININE 1.04 07/05/2021   GFRAA >60 12/28/2019   GFRNONAA >60 07/05/2021    Hepatic Lab Results  Component Value Date   AST 18 06/28/2021   ALT 15 06/28/2021   ALBUMIN 4.0 06/28/2021   ALKPHOS 78 06/28/2021    Electrolytes Lab Results  Component Value Date   NA 138 07/05/2021   K 4.0 07/05/2021   CL 101 07/05/2021   CALCIUM 8.4 (L) 07/05/2021    Bone No results found for: "VD25OH", "VD125OH2TOT", "MC9470JG2", "EZ6629UT6", "25OHVITD1", "25OHVITD2", "25OHVITD3",  "TESTOFREE", "TESTOSTERONE"  Inflammation (CRP: Acute Phase) (ESR: Chronic Phase) Lab Results  Component Value Date   ESRSEDRATE 1 01/12/2013         Note: Above Lab results reviewed.   Assessment  The primary encounter diagnosis was Lumbar radiculopathy. Diagnoses of Spinal stenosis, lumbar region, with neurogenic claudication and Chronic pain syndrome were also pertinent to this visit.  Plan of Care   Good response to previous caudal ESI.  States that during rest, he has pretty much no pain.  With exertion he does have pain but rates that it is approximately 40% better after his caudal ESI.  He is more comfortable and also able to ambulate greater distance without having a pain flare.  I told him that we can repeat his caudal ESI as needed when his pain returns.  Patient expressed understanding and will contact us when he would like to pursue another caudal ESI.  Follow-up plan:   Return for repeat caudal ESI PRN.     Series of 3 L4-L5 epidural steroid injections followed by L3-L4 and L4-L5 microdiscectomy and laminectomy by Dr. Cari Caraway December 2019.  Return of left radicular pain given immobility after surgery for esophageal cancer.  caudal ESI, left 12/14/2019, right 06/11/2020, 05/20/21        Recent Visits Date Type Provider Dept  09/09/21 Procedure visit Blake Santa, MD [facility]  Showing recent visits within past 90 days and meeting all other requirements Today's Visits Date Type Provider Dept  10/08/21 Office Visit Blake Santa, MD [facility]  Showing today's visits and meeting all other requirements Future Appointments No visits were found meeting these conditions. Showing future appointments within next 90 days and meeting all other requirements  I discussed the assessment and treatment plan with the patient. The patient was provided an opportunity to ask questions and all were answered. The patient agreed with the plan and demonstrated an  understanding of the instructions.  Patient advised to call back or seek an in-person  evaluation if the symptoms or condition worsens.  Duration of encounter: 70mnutes.  Note by: BGillis Santa MD Date: 10/08/2021; Time: 3:33 PM

## 2021-10-10 ENCOUNTER — Other Ambulatory Visit: Payer: Self-pay

## 2021-10-21 ENCOUNTER — Other Ambulatory Visit: Payer: Self-pay

## 2021-10-23 ENCOUNTER — Ambulatory Visit (INDEPENDENT_AMBULATORY_CARE_PROVIDER_SITE_OTHER): Payer: Medicare HMO | Admitting: Dermatology

## 2021-10-23 DIAGNOSIS — L821 Other seborrheic keratosis: Secondary | ICD-10-CM | POA: Diagnosis not present

## 2021-10-23 DIAGNOSIS — L720 Epidermal cyst: Secondary | ICD-10-CM | POA: Diagnosis not present

## 2021-10-23 DIAGNOSIS — L578 Other skin changes due to chronic exposure to nonionizing radiation: Secondary | ICD-10-CM

## 2021-10-23 DIAGNOSIS — Z1283 Encounter for screening for malignant neoplasm of skin: Secondary | ICD-10-CM

## 2021-10-23 DIAGNOSIS — D229 Melanocytic nevi, unspecified: Secondary | ICD-10-CM | POA: Diagnosis not present

## 2021-10-23 DIAGNOSIS — D1800 Hemangioma unspecified site: Secondary | ICD-10-CM | POA: Diagnosis not present

## 2021-10-23 DIAGNOSIS — Z85828 Personal history of other malignant neoplasm of skin: Secondary | ICD-10-CM

## 2021-10-23 DIAGNOSIS — L814 Other melanin hyperpigmentation: Secondary | ICD-10-CM

## 2021-10-23 DIAGNOSIS — L570 Actinic keratosis: Secondary | ICD-10-CM | POA: Diagnosis not present

## 2021-10-23 NOTE — Progress Notes (Signed)
Follow-Up Visit   Subjective  Blake [name] is a 76 y.o. male who presents for the following: Annual Exam (Hx BCC, SCC ). The patient presents for Total-Body Skin Exam (TBSE) for skin cancer screening and mole check.  The patient has spots, moles and lesions to be evaluated, some may be new or changing and the patient has concerns that these could be cancer.  The following portions of the chart were reviewed this encounter and updated as appropriate:   Tobacco  Allergies  Meds  Problems  Med Hx  Surg Hx  Fam Hx     Review of Systems:  No other skin or systemic complaints except as noted in HPI or Assessment and Plan.  Objective  Well appearing patient in no apparent distress; mood and affect are within normal limits.  A full examination was performed including scalp, head, eyes, ears, nose, lips, neck, chest, axillae, abdomen, back, buttocks, bilateral upper extremities, bilateral lower extremities, hands, feet, fingers, toes, fingernails, and toenails. All findings within normal limits unless otherwise noted below.  Face x 3, R neck x 1 (4) Erythematous thin papules/macules with gritty scale.   Right Upper Back 1.0 cm firm SQ nodule.   Assessment & Plan  AK (actinic keratosis) Face x 3, R neck x 1 Destruction of lesion - Face x 3, R neck x 1 Complexity: simple   Destruction method: cryotherapy   Informed consent: discussed and consent obtained   Timeout:  patient name, date of birth, surgical site, and procedure verified Lesion destroyed using liquid nitrogen: Yes   Region frozen until ice ball extended beyond lesion: Yes   Outcome: patient tolerated procedure well with no complications   Post-procedure details: wound care instructions given    Epidermal inclusion cyst Right Upper Back Benign-appearing. Exam most consistent with an epidermal inclusion cyst. Discussed that a cyst is a benign growth that can grow over time and sometimes get irritated or inflamed.  Recommend observation if it is not bothersome. Discussed option of surgical excision to remove it if it is growing, symptomatic, or other changes noted. Please call for new or changing lesions so they can be evaluated.  Lentigines - Scattered tan macules - Due to sun exposure - Benign-appearing, observe - Recommend daily broad spectrum sunscreen SPF 30+ to sun-exposed areas, reapply every 2 hours as needed. - Call for any changes  Seborrheic Keratoses - Stuck-on, waxy, tan-brown papules and/or plaques  - Benign-appearing - Discussed benign etiology and prognosis. - Observe - Call for any changes  Melanocytic Nevi - Tan-brown and/or pink-flesh-colored symmetric macules and papules - Benign appearing on exam today - Observation - Call clinic for new or changing moles - Recommend daily use of broad spectrum spf 30+ sunscreen to sun-exposed areas.   Hemangiomas - Red papules - Discussed benign nature - Observe - Call for any changes  Actinic Damage - Chronic condition, secondary to cumulative UV/sun exposure - diffuse scaly erythematous macules with underlying dyspigmentation - Recommend daily broad spectrum sunscreen SPF 30+ to sun-exposed areas, reapply every 2 hours as needed.  - Staying in the shade or wearing long sleeves, sun glasses (UVA+UVB protection) and wide brim hats (4-inch brim around the entire circumference of the hat) are also recommended for sun protection.  - Call for new or changing lesions.  History of Basal Cell Carcinoma of the Skin - No evidence of recurrence today - Recommend regular full body skin exams - Recommend daily broad spectrum sunscreen SPF 30+ to sun-exposed  areas, reapply every 2 hours as needed.  - Call if any new or changing lesions are noted between office visits  History of Squamous Cell Carcinoma of the Skin - No evidence of recurrence today - No lymphadenopathy - Recommend regular full body skin exams - Recommend daily broad  spectrum sunscreen SPF 30+ to sun-exposed areas, reapply every 2 hours as needed.  - Call if any new or changing lesions are noted between office visits  Skin cancer screening performed today.  Return in about 1 year (around 10/24/2022) for TBSE; cyst excision of the R upper back.  Luther Redo, CMA, am acting as scribe for Sarina Ser, MD . Documentation: I have reviewed the above documentation for accuracy and completeness, and I agree with the above.  Sarina Ser, MD

## 2021-10-23 NOTE — Patient Instructions (Addendum)
Due to recent changes in healthcare laws, you may see results of your pathology and/or laboratory studies on MyChart before the doctors have had a chance to review them. We understand that in some cases there may be results that are confusing or concerning to you. Please understand that not all results are received at the same time and often the doctors may need to interpret multiple results in order to provide you with the best plan of care or course of treatment. Therefore, we ask that you please give us 2 business days to thoroughly review all your results before contacting the office for clarification. Should we see a critical lab result, you will be contacted sooner.   If You Need Anything After Your Visit  If you have any questions or concerns for your doctor, please call our main line at [phone] and press option 4 to reach your doctor's medical assistant. If no one answers, please leave a voicemail as directed and we will return your call as soon as possible. Messages left after 4 pm will be answered the following business day.   You may also send us a message via MyChart. We typically respond to MyChart messages within 1-2 business days.  For prescription refills, please ask your pharmacy to contact our office. Our fax number is [phone].  If you have an urgent issue when the clinic is closed that cannot wait until the next business day, you can page your doctor at the number below.    Please note that while we do our best to be available for urgent issues outside of office hours, we are not available 24/7.   If you have an urgent issue and are unable to reach us, you may choose to seek medical care at your doctor's office, retail clinic, urgent care center, or emergency room.  If you have a medical emergency, please immediately call 911 or go to the emergency department.  Pager Numbers  - Dr. Kowalski: [phone]  - Dr. Moye: [phone]  - Dr. Stewart:  [phone]  In the event of inclement weather, please call our main line at [phone] for an update on the status of any delays or closures.  Dermatology Medication Tips: Please keep the boxes that topical medications come in in order to help keep track of the instructions about where and how to use these. Pharmacies typically print the medication instructions only on the boxes and not directly on the medication tubes.   If your medication is too expensive, please contact our office at [phone] option 4 or send us a message through MyChart.   We are unable to tell what your co-pay for medications will be in advance as this is different depending on your insurance coverage. However, we may be able to find a substitute medication at lower cost or fill out paperwork to get insurance to cover a needed medication.   If a prior authorization is required to get your medication covered by your insurance company, please allow us 1-2 business days to complete this process.  Drug prices often vary depending on where the prescription is filled and some pharmacies may offer cheaper prices.  The website www.goodrx.com contains coupons for medications through different pharmacies. The prices here do not account for what the cost may be with help from insurance (it may be cheaper with your insurance), but the website can give you the price if you did not use any insurance.  - You can print the associated coupon and take it with   your prescription to the pharmacy.  - You may also stop by our office during regular business hours and pick up a GoodRx coupon card.  - If you need your prescription sent electronically to a different pharmacy, notify our office through [facility] MyChart or by phone at [phone] option 4.     Si Usted Necesita Algo Despus de Su Visita  Tambin puede enviarnos un mensaje a travs de MyChart. Por lo general respondemos a los mensajes de MyChart en el transcurso de 1 a 2  das hbiles.  Para renovar recetas, por favor pida a su farmacia que se ponga en contacto con nuestra oficina. Nuestro nmero de fax es el [phone].  Si tiene un asunto urgente cuando la clnica est cerrada y que no puede esperar hasta el siguiente da hbil, puede llamar/localizar a su doctor(a) al nmero que aparece a continuacin.   Por favor, tenga en cuenta que aunque hacemos todo lo posible para estar disponibles para asuntos urgentes fuera del horario de oficina, no estamos disponibles las 24 horas del da, los 7 das de la semana.   Si tiene un problema urgente y no puede comunicarse con nosotros, puede optar por buscar atencin mdica  en el consultorio de su doctor(a), en una clnica privada, en un centro de atencin urgente o en una sala de emergencias.  Si tiene una emergencia mdica, por favor llame inmediatamente al 911 o vaya a la sala de emergencias.  Nmeros de bper  - Dr. Kowalski: [phone]  - Dra. Moye: [phone]  - Dra. Stewart: [phone]  En caso de inclemencias del tiempo, por favor llame a nuestra lnea principal al [phone] para una actualizacin sobre el estado de cualquier retraso o cierre.  Consejos para la medicacin en dermatologa: Por favor, guarde las cajas en las que vienen los medicamentos de uso tpico para ayudarle a seguir las instrucciones sobre dnde y cmo usarlos. Las farmacias generalmente imprimen las instrucciones del medicamento slo en las cajas y no directamente en los tubos del medicamento.   Si su medicamento es muy caro, por favor, pngase en contacto con nuestra oficina llamando al [phone] y presione la opcin 4 o envenos un mensaje a travs de MyChart.   No podemos decirle cul ser su copago por los medicamentos por adelantado ya que esto es diferente dependiendo de la cobertura de su seguro. Sin embargo, es posible que podamos encontrar un medicamento sustituto a menor costo o llenar un formulario para que el  seguro cubra el medicamento que se considera necesario.   Si se requiere una autorizacin previa para que su compaa de seguros cubra su medicamento, por favor permtanos de 1 a 2 das hbiles para completar este proceso.  Los precios de los medicamentos varan con frecuencia dependiendo del lugar de dnde se surte la receta y alguna farmacias pueden ofrecer precios ms baratos.  El sitio web www.goodrx.com tiene cupones para medicamentos de diferentes farmacias. Los precios aqu no tienen en cuenta lo que podra costar con la ayuda del seguro (puede ser ms barato con su seguro), pero el sitio web puede darle el precio si no utiliz ningn seguro.  - Puede imprimir el cupn correspondiente y llevarlo con su receta a la farmacia.  - Tambin puede pasar por nuestra oficina durante el horario de atencin regular y recoger una tarjeta de cupones de GoodRx.  - Si necesita que su receta se enve electrnicamente a una farmacia diferente, informe a nuestra oficina a travs de MyChart de [facility]   o por telfono llamando al [phone] y presione la opcin 4.   Pre-Operative Instructions  You are scheduled for a surgical procedure at [facility]. We recommend you read the following instructions. If you have any questions or concerns, please call the office at [phone].  Shower and wash the entire body with soap and water the day of your surgery paying special attention to cleansing at and around the planned surgery site.  Avoid aspirin or aspirin containing products at least fourteen (14) days prior to your surgical procedure and for at least one week (7 Days) after your surgical procedure. If you take aspirin on a regular basis for heart disease or history of stroke or for any other reason, we may recommend you continue taking aspirin but please notify us if you take this on a regular basis. Aspirin can cause more bleeding to occur during surgery as well as prolonged bleeding and  bruising after surgery.   Avoid other nonsteroidal pain medications at least one week prior to surgery and at least one week prior to your surgery. These include medications such as Ibuprofen (Motrin, Advil and Nuprin), Naprosyn, Voltaren, Relafen, etc. If medications are used for therapeutic reasons, please inform us as they can cause increased bleeding or prolonged bleeding during and bruising after surgical procedures.   Please advise us if you are taking any "blood thinner" medications such as Coumadin or Dipyridamole or Plavix or similar medications. These cause increased bleeding and prolonged bleeding during procedures and bruising after surgical procedures. We may have to consider discontinuing these medications briefly prior to and shortly after your surgery if safe to do so.   Please inform us of all medications you are currently taking. All medications that are taken regularly should be taken the day of surgery as you always do. Nevertheless, we need to be informed of what medications you are taking prior to surgery to know whether they will affect the procedure or cause any complications.   Please inform us of any medication allergies. Also inform us of whether you have allergies to Latex or rubber products or whether you have had any adverse reaction to Lidocaine or Epinephrine.  Please inform us of any prosthetic or artificial body parts such as artificial heart valve, joint replacements, etc., or similar condition that might require preoperative antibiotics.   We recommend avoidance of alcohol at least two weeks prior to surgery and continued avoidance for at least two weeks after surgery.   We recommend discontinuation of tobacco smoking at least two weeks prior to surgery and continued abstinence for at least two weeks after surgery.  Do not plan strenuous exercise, strenuous work or strenuous lifting for approximately four weeks after your surgery.   We request if you are unable  to make your scheduled surgical appointment, please call us at least a week in advance or as soon as you are aware of a problem so that we can cancel or reschedule the appointment.   You MAY TAKE TYLENOL (acetaminophen) for pain as it is not a blood thinner.   PLEASE PLAN TO BE IN TOWN FOR TWO WEEKS FOLLOWING SURGERY, THIS IS IMPORTANT SO YOU CAN BE CHECKED FOR DRESSING CHANGES, SUTURE REMOVAL AND TO MONITOR FOR POSSIBLE COMPLICATIONS.  

## 2021-10-24 ENCOUNTER — Other Ambulatory Visit: Payer: Self-pay

## 2021-10-27 ENCOUNTER — Encounter: Payer: Self-pay | Admitting: Dermatology

## 2021-10-28 ENCOUNTER — Other Ambulatory Visit: Payer: Self-pay

## 2021-10-29 ENCOUNTER — Ambulatory Visit (INDEPENDENT_AMBULATORY_CARE_PROVIDER_SITE_OTHER): Payer: Medicare HMO

## 2021-10-29 ENCOUNTER — Other Ambulatory Visit: Payer: Self-pay

## 2021-10-29 ENCOUNTER — Ambulatory Visit (INDEPENDENT_AMBULATORY_CARE_PROVIDER_SITE_OTHER): Payer: Medicare HMO | Admitting: Vascular Surgery

## 2021-10-29 ENCOUNTER — Encounter (INDEPENDENT_AMBULATORY_CARE_PROVIDER_SITE_OTHER): Payer: Self-pay | Admitting: Vascular Surgery

## 2021-10-29 VITALS — BP 129/61 | HR 72 | Resp 16 | Wt 148.8 lb

## 2021-10-29 DIAGNOSIS — I10 Essential (primary) hypertension: Secondary | ICD-10-CM | POA: Diagnosis not present

## 2021-10-29 DIAGNOSIS — E785 Hyperlipidemia, unspecified: Secondary | ICD-10-CM

## 2021-10-29 DIAGNOSIS — E119 Type 2 diabetes mellitus without complications: Secondary | ICD-10-CM

## 2021-10-29 DIAGNOSIS — I6523 Occlusion and stenosis of bilateral carotid arteries: Secondary | ICD-10-CM | POA: Diagnosis not present

## 2021-10-29 NOTE — Progress Notes (Signed)
MRN : [id number]  Blake [name] is a 76 y.o. (July 14, 1945) male who presents with chief complaint of  Chief Complaint  Patient presents with   Follow-up    Ultrasound follow up  .  History of Present Illness: Patient returns in follow-up of his carotid disease.  He underwent right carotid artery stent placement almost 4 months ago for high-grade stenosis with previous history of radiation.  He is doing well today.  He denies any focal neurologic symptoms. Specifically, the patient denies amaurosis fugax, speech or swallowing difficulties, or arm or leg weakness or numbness. Duplex today shows his right carotid stent to be widely patent with 40 to 59% stenosis in the left carotid artery.   Current Outpatient Medications  Medication Sig Dispense Refill   aspirin 81 MG EC tablet Take 1 tablet (81 mg total) by mouth daily at 6 (six) AM. Swallow whole. 30 tablet 11   clopidogrel (PLAVIX) 75 MG tablet Take 1 tablet (75 mg total) by mouth daily. 30 tablet 6   lisinopril (ZESTRIL) 10 MG tablet TAKE 1 TABLET (10 MG TOTAL) BY MOUTH DAILY. 30 tablet 3   methocarbamol (ROBAXIN) 500 MG tablet Take 1 and 1/2 tablets (750 mg total) by mouth every 6 (six) hours as needed for muscle spasms. 90 tablet 1   metoprolol succinate (TOPROL-XL) 100 MG 24 hr tablet Take 1.5 tablets (150 mg total) by mouth once daily 135 tablet 1   mirtazapine (REMERON) 15 MG tablet Take 1 tablet (15 mg total) by mouth nightly 30 tablet 11   omeprazole (PRILOSEC) 40 MG capsule Take 40 mg by mouth daily.  3   omeprazole (PRILOSEC) 40 MG capsule Take 1 capsule (40 mg total) by mouth once daily 90 capsule 3   tamsulosin (FLOMAX) 0.4 MG CAPS capsule TAKE 1 CAPSULE BY MOUTH ONCE DAILY. TAKE 30 MINUTES AFTER SAME MEAL EACH DAY. 90 capsule 3   atorvastatin (LIPITOR) 80 MG tablet TAKE 1 TABLET BY MOUTH ONCE DAILY 90 tablet 3   No current facility-administered medications for this visit.    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, bilateral    Dyspnea    Dysrhythmia    Esophageal cancer ([place]) 2021   GERD (gastroesophageal reflux disease)    Hyperlipidemia    Hypertension    Numbness and tingling of both lower extremities    with positioning   Paroxysmal atrial fibrillation ([place])    Port-A-Cath in place 2006   Prostate enlargement    Squamous cell carcinoma of skin 12/14/2019   L pretibial - ED&C    T2DM (type 2 diabetes mellitus) ([place])     Past Surgical History:  Procedure Laterality Date   ABDOMINAL SURGERY     CAROTID PTA/STENT INTERVENTION Right 07/04/2021   Procedure: CAROTID PTA/STENT INTERVENTION;  Surgeon: Algernon Huxley, MD;  Location: [facility];  Service: Cardiovascular;  Laterality: Right;   COLONOSCOPY  12/08/2003   COLONOSCOPY WITH PROPOFOL N/A 03/21/2019   Procedure: COLONOSCOPY WITH PROPOFOL;  Surgeon: Toledo, Benay Pike, MD;  Location: [facility];  Service: Gastroenterology;  Laterality: N/A;   ESOPHAGOGASTRODUODENOSCOPY (EGD) WITH PROPOFOL N/A 03/21/2019   Procedure: ESOPHAGOGASTRODUODENOSCOPY (EGD) WITH PROPOFOL;  Surgeon: Toledo, Benay Pike, MD;  Location: [facility];  Service: Gastroenterology;  Laterality: N/A;   esophogeal cancer     JOINT REPLACEMENT Right    total hip   LUMBAR LAMINECTOMY/DECOMPRESSION MICRODISCECTOMY N/A 03/03/2018   Procedure: LUMBAR LAMINECTOMY/DECOMPRESSION MICRODISCECTOMY 1 LEVEL-L3-4,L4-5;  Surgeon: Meade Maw, MD;  Location: [facility];  Service: Neurosurgery;  Laterality: N/A;   LUMBAR LAMINECTOMY/DECOMPRESSION MICRODISCECTOMY N/A 02/24/2020   Procedure: LEFT L3-4 MICRODISCECTOMY, L4-5 DECOMPRESSION;  Surgeon: Meade Maw, MD;  Location: [facility];  Service: Neurosurgery;  Laterality: N/A;   MAXIMUM ACCESS (MAS) TRANSFORAMINAL LUMBAR INTERBODY FUSION (TLIF) 1 LEVEL N/A 07/13/2020   Procedure: OPEN L3-4 TRANSFORAMINAL LUMBAR INTERBODY FUSION (TLIF);  Surgeon: Meade Maw, MD;  Location: [facility];  Service: Neurosurgery;  Laterality: N/A;   PARTIAL HIP ARTHROPLASTY Right 2014   PORT-A-CATH REMOVAL     PORTACATH PLACEMENT Left 01/12/2020   Procedure: INSERTION PORT-A-CATH, with ultrasound fluoroscopy;  Surgeon: Nestor Lewandowsky, MD;  Location: [facility];  Service: General;  Laterality: Left;   TONGUE BIOPSY  2006   TONSILLECTOMY     TRIGGER FINGER RELEASE Right 2013   UPPER GI ENDOSCOPY  01/25/14   multiple gastric polyps     Social History   Tobacco Use   Smoking status: Former    Packs/day: 1.00    Years: 30.00    Total pack years: 30.00    Types: Cigarettes    Quit date: 02/18/1994    Years since quitting: 27.7   Smokeless tobacco: Never  Vaping Use   Vaping Use: Never used  Substance Use Topics   Alcohol use: Not Currently   Drug use: No      Family History  Problem Relation Age of Onset   Diabetes Mother    Congestive Heart Failure Mother    Breast cancer Mother    Lung cancer Father     Allergies  Allergen Reactions   Sulfa Antibiotics Rash     REVIEW OF SYSTEMS (Negative unless checked)  Constitutional: '[]'$ Weight loss  '[]'$ Fever  '[]'$ Chills Cardiac: '[]'$ Chest pain   '[]'$ Chest pressure   '[x]'$ Palpitations   '[]'$ Shortness of breath when laying flat   '[]'$ Shortness of breath at rest   '[]'$ Shortness of breath with exertion. Vascular:  '[]'$ Pain in legs with walking   '[]'$ Pain in legs at rest   '[]'$ Pain in legs when laying flat   '[]'$ Claudication   '[]'$ Pain in feet when walking   '[]'$ Pain in feet at rest  '[]'$ Pain in feet when laying flat   '[]'$ History of DVT   '[]'$ Phlebitis   '[]'$ Swelling in legs   '[]'$ Varicose veins   '[]'$ Non-healing ulcers Pulmonary:   '[]'$ Uses home oxygen   '[]'$ Productive cough   '[]'$ Hemoptysis   '[]'$ Wheeze  '[]'$ COPD   '[]'$ Asthma Neurologic:  '[]'$ Dizziness  '[]'$ Blackouts   '[]'$ Seizures   '[]'$ History of stroke   '[]'$ History of TIA  '[]'$ Aphasia   '[]'$ Temporary blindness   '[]'$ Dysphagia   '[]'$ Weakness or numbness in arms   '[]'$ Weakness or numbness in legs Musculoskeletal:  '[x]'$ Arthritis   '[]'$ Joint swelling   '[]'$ Joint pain   '[x]'$ Low back pain Hematologic:  '[]'$ Easy bruising  '[]'$ Easy bleeding   '[]'$   Hypercoagulable state   '[]'$ Anemic  '[]'$ Hepatitis Gastrointestinal:  '[]'$ Blood in stool   '[]'$ Vomiting blood  '[x]'$ Gastroesophageal reflux/heartburn   '[]'$ Difficulty swallowing. Genitourinary:  '[]'$ Chronic kidney disease   '[]'$ Difficult urination  '[x]'$ Frequent urination  '[]'$ Burning with urination   '[]'$ Blood in urine Skin:  '[]'$ Rashes   '[]'$ Ulcers   '[]'$ Wounds Psychological:  '[]'$ History of anxiety   '[]'$  History of major depression.  Physical Examination  Vitals:   10/29/21 1348 10/29/21 1349  BP: (!) 108/59 129/61  Pulse: 72   Resp: 16   Weight: 148 lb 12.8 oz (67.5 kg)    Body mass index is 24.76 kg/m. Gen:  WD/WN, NAD Head: [place]/AT, No temporalis wasting. Ear/Nose/Throat: Hearing grossly intact, nares w/o erythema or drainage, trachea midline Eyes: Conjunctiva clear. Sclera non-icteric Neck: Supple.  No bruit  Pulmonary:  Good air movement, equal and clear to auscultation bilaterally.  Cardiac: RRR, No JVD Vascular:  Vessel Right Left  Radial Palpable Palpable       Musculoskeletal: M/S 5/5 throughout.  No deformity or atrophy.  Neurologic: CN 2-12 intact. Sensation grossly intact in extremities.  Symmetrical.  Speech is fluent. Motor exam as listed above. Psychiatric: Judgment intact, Mood & affect appropriate for pt's clinical situation. Dermatologic: No rashes or ulcers noted.  No cellulitis or open wounds. Lymph : No  Cervical, Axillary, or Inguinal lymphadenopathy.    CBC Lab Results  Component Value Date   WBC 7.0 07/05/2021   HGB 10.4 (L) 07/05/2021   HCT 34.3 (L) 07/05/2021   MCV 78.7 (L) 07/05/2021   PLT 236 07/05/2021    BMET    Component Value Date/Time   NA 138 07/05/2021 0516   NA 130 (L) 01/26/2013 0505   K 4.0 07/05/2021 0516   K 4.6 01/26/2013 0505   CL 101 07/05/2021 0516   CL 101 01/26/2013 0505   CO2 27 07/05/2021 0516   CO2 26 01/26/2013 0505   GLUCOSE 114 (H) 07/05/2021 0516   GLUCOSE 138 (H) 01/26/2013 0505   BUN 18 07/05/2021 0516   BUN 19 (H) 01/26/2013 0505   CREATININE 1.04 07/05/2021 0516   CREATININE 1.18 01/26/2013 0505   CALCIUM 8.4 (L) 07/05/2021 0516   CALCIUM 8.3 (L) 01/26/2013 0505   GFRNONAA >60 07/05/2021 0516   GFRNONAA >60 01/26/2013 0505   GFRAA >60 12/28/2019 1328   GFRAA >60 01/26/2013 0505   CrCl cannot be calculated (Patient's most recent lab result is older than the maximum 21 days allowed.).  COAG Lab Results  Component Value Date   INR 1.0 07/04/2020   INR 1.0 02/17/2020   INR 1.0 01/10/2020    Radiology No results found.   Assessment/Plan Essential (primary) hypertension blood pressure control important in reducing the progression of atherosclerotic disease. On appropriate oral medications.     Diabetes mellitus ([place]) blood glucose control important in reducing the progression of atherosclerotic disease. Also, involved in wound healing. On appropriate medications.     HLD (hyperlipidemia) lipid control important in reducing the progression of atherosclerotic disease. Continue statin therapy  Bilateral carotid artery stenosis Duplex today shows his right carotid stent to be widely patent with 40 to 59% stenosis in the left carotid artery.  Continue current medical regimen.  Recheck in 6 months.    Leotis Pain, MD  10/29/2021 2:06 PM    This note was created with Dragon medical transcription system.  Any errors from  dictation are purely unintentional

## 2021-10-29 NOTE — Assessment & Plan Note (Signed)
Duplex today shows his right carotid stent to be widely patent with 40 to 59% stenosis in the left carotid artery.  Continue current medical regimen.  Recheck in 6 months.

## 2021-11-07 ENCOUNTER — Inpatient Hospital Stay: Payer: Medicare HMO | Attending: Oncology

## 2021-11-07 ENCOUNTER — Other Ambulatory Visit: Payer: Self-pay

## 2021-11-07 DIAGNOSIS — Z452 Encounter for adjustment and management of vascular access device: Secondary | ICD-10-CM | POA: Diagnosis not present

## 2021-11-07 DIAGNOSIS — Z95828 Presence of other vascular implants and grafts: Secondary | ICD-10-CM

## 2021-11-07 DIAGNOSIS — C155 Malignant neoplasm of lower third of esophagus: Secondary | ICD-10-CM | POA: Insufficient documentation

## 2021-11-07 MED ORDER — HEPARIN SOD (PORK) LOCK FLUSH 100 UNIT/ML IV SOLN
500.0000 [IU] | Freq: Once | INTRAVENOUS | Status: AC
  Administered 2021-11-07: 500 [IU] via INTRAVENOUS
  Filled 2021-11-07: qty 5

## 2021-11-07 MED ORDER — SODIUM CHLORIDE 0.9% FLUSH
10.0000 mL | INTRAVENOUS | Status: DC | PRN
  Administered 2021-11-07: 10 mL via INTRAVENOUS
  Filled 2021-11-07: qty 10

## 2021-11-12 ENCOUNTER — Other Ambulatory Visit: Payer: Self-pay

## 2021-11-21 ENCOUNTER — Other Ambulatory Visit: Payer: Self-pay

## 2021-11-21 MED ORDER — TAMSULOSIN HCL 0.4 MG PO CAPS
ORAL_CAPSULE | ORAL | 3 refills | Status: DC
  Filled 2021-11-21: qty 90, 90d supply, fill #0
  Filled 2022-02-24: qty 90, 90d supply, fill #1
  Filled 2022-06-02: qty 90, 90d supply, fill #2
  Filled 2022-09-13: qty 90, 90d supply, fill #3

## 2021-11-26 ENCOUNTER — Ambulatory Visit (INDEPENDENT_AMBULATORY_CARE_PROVIDER_SITE_OTHER): Payer: Medicare HMO | Admitting: Dermatology

## 2021-11-26 ENCOUNTER — Other Ambulatory Visit: Payer: Self-pay

## 2021-11-26 DIAGNOSIS — D492 Neoplasm of unspecified behavior of bone, soft tissue, and skin: Secondary | ICD-10-CM

## 2021-11-26 DIAGNOSIS — L720 Epidermal cyst: Secondary | ICD-10-CM

## 2021-11-26 MED ORDER — MUPIROCIN 2 % EX OINT
1.0000 | TOPICAL_OINTMENT | Freq: Every day | CUTANEOUS | 1 refills | Status: DC
  Filled 2021-11-26: qty 22, 22d supply, fill #0

## 2021-11-26 NOTE — Progress Notes (Unsigned)
   Follow-Up Visit   Subjective  Blake [name] is a 76 y.o. male who presents for the following: Cyst vs other (R upper back, pt presents for excision).  The following portions of the chart were reviewed this encounter and updated as appropriate:   Tobacco  Allergies  Meds  Problems  Med Hx  Surg Hx  Fam Hx     Review of Systems:  No other skin or systemic complaints except as noted in HPI or Assessment and Plan.  Objective  Well appearing patient in no apparent distress; mood and affect are within normal limits.  A focused examination was performed including back. Relevant physical exam findings are noted in the Assessment and Plan.  Right Upper Back Cystic pap 1.7cm   Assessment & Plan  Neoplasm of skin Right Upper Back  Skin excision  Lesion length (cm):  1.7 Lesion width (cm):  1.7 Margin per side (cm):  0 Total excision diameter (cm):  1.7 Informed consent: discussed and consent obtained   Timeout: patient name, date of birth, surgical site, and procedure verified   Procedure prep:  Patient was prepped and draped in usual sterile fashion Prep type:  Isopropyl alcohol and povidone-iodine Anesthesia: the lesion was anesthetized in a standard fashion   Anesthetic:  1% lidocaine w/ epinephrine 1-100,000 buffered w/ 8.4% NaHCO3 (3cc lido w/ epi, 3cc bupivicaine, Total of 6cc) Instrument used: #15 blade   Hemostasis achieved with: pressure   Hemostasis achieved with comment:  Electrocautery Outcome: patient tolerated procedure well with no complications   Post-procedure details: sterile dressing applied and wound care instructions given   Dressing type: bandage, pressure dressing and bacitracin (Mupirocin)    Skin repair Complexity:  Complex Final length (cm):  3 Reason for type of repair: reduce tension to allow closure, reduce the risk of dehiscence, infection, and necrosis, reduce subcutaneous dead space and avoid a hematoma, allow closure of the large defect,  preserve normal anatomy, preserve normal anatomical and functional relationships and enhance both functionality and cosmetic results   Undermining: area extensively undermined   Undermining comment:  Undermining Defect 1.7cm Subcutaneous layers (deep stitches):  Suture size:  2-0 Suture type: Vicryl (polyglactin 910)   Subcutaneous suture technique: Inverted Dermal. Fine/surface layer approximation (top stitches):  Suture size:  3-0 Suture type: nylon   Stitches: horizontal mattress   Suture removal (days):  7 Hemostasis achieved with: pressure Outcome: patient tolerated procedure well with no complications   Post-procedure details: sterile dressing applied and wound care instructions given   Dressing type: bandage, pressure dressing and bacitracin (Mupirocin)    Specimen 1 - Surgical pathology Differential Diagnosis: D48.5 Cyst vs other  Check Margins: No Cystic pap 1.7cm  Cyst vs other, excised today Start Mupirocin oint qd to excision site  Related Medications mupirocin ointment (BACTROBAN) 2 % Apply 1 Application topically daily. Qd to excision site   Return in about 1 week (around 12/03/2021) for suture removal.  I, Othelia Pulling, RMA, am acting as scribe for Sarina Ser, MD . Documentation: I have reviewed the above documentation for accuracy and completeness, and I agree with the above.  Sarina Ser, MD

## 2021-11-26 NOTE — Patient Instructions (Signed)

## 2021-11-27 ENCOUNTER — Encounter: Payer: Self-pay | Admitting: Dermatology

## 2021-11-27 ENCOUNTER — Telehealth: Payer: Self-pay

## 2021-11-27 NOTE — Telephone Encounter (Signed)
Left pt msg to call if any problems after yesterday's surgery./sh 

## 2021-12-03 ENCOUNTER — Ambulatory Visit (INDEPENDENT_AMBULATORY_CARE_PROVIDER_SITE_OTHER): Payer: Medicare HMO | Admitting: Dermatology

## 2021-12-03 DIAGNOSIS — Z4802 Encounter for removal of sutures: Secondary | ICD-10-CM

## 2021-12-03 DIAGNOSIS — L720 Epidermal cyst: Secondary | ICD-10-CM

## 2021-12-03 NOTE — Patient Instructions (Signed)
Due to recent changes in healthcare laws, you may see results of your pathology and/or laboratory studies on MyChart before the doctors have had a chance to review them. We understand that in some cases there may be results that are confusing or concerning to you. Please understand that not all results are received at the same time and often the doctors may need to interpret multiple results in order to provide you with the best plan of care or course of treatment. Therefore, we ask that you please give us 2 business days to thoroughly review all your results before contacting the office for clarification. Should we see a critical lab result, you will be contacted sooner.   If You Need Anything After Your Visit  If you have any questions or concerns for your doctor, please call our main line at [phone] and press option 4 to reach your doctor's medical assistant. If no one answers, please leave a voicemail as directed and we will return your call as soon as possible. Messages left after 4 pm will be answered the following business day.   You may also send us a message via MyChart. We typically respond to MyChart messages within 1-2 business days.  For prescription refills, please ask your pharmacy to contact our office. Our fax number is [phone].  If you have an urgent issue when the clinic is closed that cannot wait until the next business day, you can page your doctor at the number below.    Please note that while we do our best to be available for urgent issues outside of office hours, we are not available 24/7.   If you have an urgent issue and are unable to reach us, you may choose to seek medical care at your doctor's office, retail clinic, urgent care center, or emergency room.  If you have a medical emergency, please immediately call 911 or go to the emergency department.  Pager Numbers  - Dr. Kowalski: [phone]  - Dr. Moye: [phone]  - Dr. Stewart:  [phone]  In the event of inclement weather, please call our main line at [phone] for an update on the status of any delays or closures.  Dermatology Medication Tips: Please keep the boxes that topical medications come in in order to help keep track of the instructions about where and how to use these. Pharmacies typically print the medication instructions only on the boxes and not directly on the medication tubes.   If your medication is too expensive, please contact our office at [phone] option 4 or send us a message through MyChart.   We are unable to tell what your co-pay for medications will be in advance as this is different depending on your insurance coverage. However, we may be able to find a substitute medication at lower cost or fill out paperwork to get insurance to cover a needed medication.   If a prior authorization is required to get your medication covered by your insurance company, please allow us 1-2 business days to complete this process.  Drug prices often vary depending on where the prescription is filled and some pharmacies may offer cheaper prices.  The website www.goodrx.com contains coupons for medications through different pharmacies. The prices here do not account for what the cost may be with help from insurance (it may be cheaper with your insurance), but the website can give you the price if you did not use any insurance.  - You can print the associated coupon and take it with   your prescription to the pharmacy.  - You may also stop by our office during regular business hours and pick up a GoodRx coupon card.  - If you need your prescription sent electronically to a different pharmacy, notify our office through [facility] MyChart or by phone at [phone] option 4.     Si Usted Necesita Algo Despus de Su Visita  Tambin puede enviarnos un mensaje a travs de MyChart. Por lo general respondemos a los mensajes de MyChart en el transcurso de 1 a 2  das hbiles.  Para renovar recetas, por favor pida a su farmacia que se ponga en contacto con nuestra oficina. Nuestro nmero de fax es el [phone].  Si tiene un asunto urgente cuando la clnica est cerrada y que no puede esperar hasta el siguiente da hbil, puede llamar/localizar a su doctor(a) al nmero que aparece a continuacin.   Por favor, tenga en cuenta que aunque hacemos todo lo posible para estar disponibles para asuntos urgentes fuera del horario de oficina, no estamos disponibles las 24 horas del da, los 7 das de la semana.   Si tiene un problema urgente y no puede comunicarse con nosotros, puede optar por buscar atencin mdica  en el consultorio de su doctor(a), en una clnica privada, en un centro de atencin urgente o en una sala de emergencias.  Si tiene una emergencia mdica, por favor llame inmediatamente al 911 o vaya a la sala de emergencias.  Nmeros de bper  - Dr. Kowalski: [phone]  - Dra. Moye: [phone]  - Dra. Stewart: [phone]  En caso de inclemencias del tiempo, por favor llame a nuestra lnea principal al [phone] para una actualizacin sobre el estado de cualquier retraso o cierre.  Consejos para la medicacin en dermatologa: Por favor, guarde las cajas en las que vienen los medicamentos de uso tpico para ayudarle a seguir las instrucciones sobre dnde y cmo usarlos. Las farmacias generalmente imprimen las instrucciones del medicamento slo en las cajas y no directamente en los tubos del medicamento.   Si su medicamento es muy caro, por favor, pngase en contacto con nuestra oficina llamando al [phone] y presione la opcin 4 o envenos un mensaje a travs de MyChart.   No podemos decirle cul ser su copago por los medicamentos por adelantado ya que esto es diferente dependiendo de la cobertura de su seguro. Sin embargo, es posible que podamos encontrar un medicamento sustituto a menor costo o llenar un formulario para que el  seguro cubra el medicamento que se considera necesario.   Si se requiere una autorizacin previa para que su compaa de seguros cubra su medicamento, por favor permtanos de 1 a 2 das hbiles para completar este proceso.  Los precios de los medicamentos varan con frecuencia dependiendo del lugar de dnde se surte la receta y alguna farmacias pueden ofrecer precios ms baratos.  El sitio web www.goodrx.com tiene cupones para medicamentos de diferentes farmacias. Los precios aqu no tienen en cuenta lo que podra costar con la ayuda del seguro (puede ser ms barato con su seguro), pero el sitio web puede darle el precio si no utiliz ningn seguro.  - Puede imprimir el cupn correspondiente y llevarlo con su receta a la farmacia.  - Tambin puede pasar por nuestra oficina durante el horario de atencin regular y recoger una tarjeta de cupones de GoodRx.  - Si necesita que su receta se enve electrnicamente a una farmacia diferente, informe a nuestra oficina a travs de MyChart de [place]   o por telfono llamando al [phone] y presione la opcin 4.  

## 2021-12-03 NOTE — Progress Notes (Signed)
   Follow-Up Visit   Subjective  Blake [name] is a 76 y.o. male who presents for the following: Cyst bx proven (R upper back).   The following portions of the chart were reviewed this encounter and updated as appropriate:       Review of Systems:  No other skin or systemic complaints except as noted in HPI or Assessment and Plan.  Objective  Well appearing patient in no apparent distress; mood and affect are within normal limits.  A focused examination was performed including back. Relevant physical exam findings are noted in the Assessment and Plan.  Right Upper Back Healing excision site    Assessment & Plan  Epidermoid cyst Right Upper Back  Bx proven, healing  Encounter for Removal of Sutures - Incision site at the R upper back is clean, dry and intact - Wound cleansed, sutures removed, wound cleansed and steri strips applied.  - Discussed pathology results showing epidermoid cyst  - Patient advised to keep steri-strips dry until they fall off. - Scars remodel for a full year. - Once steri-strips fall off, patient can apply over-the-counter silicone scar cream each night to help with scar remodeling if desired. - Patient advised to call with any concerns or if they notice any new or changing lesions.    Return for ~16mto recheck spot that was txted with LN2 09/2021 and not resolved.  I, SOthelia Pulling RMA, am acting as scribe for TBrendolyn Patty MD .  Documentation: I have reviewed the above documentation for accuracy and completeness, and I agree with the above.  TBrendolyn PattyMD

## 2021-12-09 ENCOUNTER — Other Ambulatory Visit: Payer: Self-pay

## 2021-12-17 ENCOUNTER — Other Ambulatory Visit: Payer: Self-pay

## 2021-12-17 DIAGNOSIS — E782 Mixed hyperlipidemia: Secondary | ICD-10-CM | POA: Diagnosis not present

## 2021-12-17 DIAGNOSIS — Z23 Encounter for immunization: Secondary | ICD-10-CM | POA: Diagnosis not present

## 2021-12-17 DIAGNOSIS — Z79899 Other long term (current) drug therapy: Secondary | ICD-10-CM | POA: Diagnosis not present

## 2021-12-17 DIAGNOSIS — I2510 Atherosclerotic heart disease of native coronary artery without angina pectoris: Secondary | ICD-10-CM | POA: Diagnosis not present

## 2021-12-17 DIAGNOSIS — I10 Essential (primary) hypertension: Secondary | ICD-10-CM | POA: Diagnosis not present

## 2021-12-17 DIAGNOSIS — E118 Type 2 diabetes mellitus with unspecified complications: Secondary | ICD-10-CM | POA: Diagnosis not present

## 2021-12-17 DIAGNOSIS — I480 Paroxysmal atrial fibrillation: Secondary | ICD-10-CM | POA: Diagnosis not present

## 2021-12-17 MED ORDER — METOPROLOL SUCCINATE ER 100 MG PO TB24
ORAL_TABLET | ORAL | 1 refills | Status: DC
Start: 2021-12-17 — End: 2022-08-27
  Filled 2021-12-17 – 2022-02-24 (×2): qty 180, 90d supply, fill #0
  Filled 2022-06-02: qty 180, 90d supply, fill #1

## 2021-12-27 ENCOUNTER — Other Ambulatory Visit: Payer: Self-pay

## 2021-12-30 ENCOUNTER — Inpatient Hospital Stay: Payer: Medicare HMO | Attending: Oncology

## 2021-12-30 ENCOUNTER — Ambulatory Visit
Admission: RE | Admit: 2021-12-30 | Discharge: 2021-12-30 | Disposition: A | Discharge status: Home / Self Care | Payer: Medicare HMO | Source: Ambulatory Visit | Attending: Oncology | Admitting: Oncology

## 2021-12-30 DIAGNOSIS — Z08 Encounter for follow-up examination after completed treatment for malignant neoplasm: Secondary | ICD-10-CM | POA: Diagnosis not present

## 2021-12-30 DIAGNOSIS — Z8501 Personal history of malignant neoplasm of esophagus: Secondary | ICD-10-CM | POA: Diagnosis not present

## 2021-12-30 DIAGNOSIS — N200 Calculus of kidney: Secondary | ICD-10-CM | POA: Diagnosis not present

## 2021-12-30 DIAGNOSIS — R918 Other nonspecific abnormal finding of lung field: Secondary | ICD-10-CM | POA: Diagnosis not present

## 2021-12-30 DIAGNOSIS — J439 Emphysema, unspecified: Secondary | ICD-10-CM | POA: Diagnosis not present

## 2021-12-30 DIAGNOSIS — C159 Malignant neoplasm of esophagus, unspecified: Secondary | ICD-10-CM

## 2021-12-30 DIAGNOSIS — I700 Atherosclerosis of aorta: Secondary | ICD-10-CM | POA: Diagnosis not present

## 2021-12-30 LAB — CBC WITH DIFFERENTIAL/PLATELET
Abs Immature Granulocytes: 0.03 10*3/uL (ref 0.00–0.07)
Basophils Absolute: 0.1 10*3/uL (ref 0.0–0.1)
Basophils Relative: 1 %
Eosinophils Absolute: 0.2 10*3/uL (ref 0.0–0.5)
Eosinophils Relative: 2 %
HCT: 40.9 % (ref 39.0–52.0)
Hemoglobin: 12.4 g/dL — ABNORMAL LOW (ref 13.0–17.0)
Immature Granulocytes: 0 %
Lymphocytes Relative: 17 %
Lymphs Abs: 1.5 10*3/uL (ref 0.7–4.0)
MCH: 24.1 pg — ABNORMAL LOW (ref 26.0–34.0)
MCHC: 30.3 g/dL (ref 30.0–36.0)
MCV: 79.6 fL — ABNORMAL LOW (ref 80.0–100.0)
Monocytes Absolute: 0.8 10*3/uL (ref 0.1–1.0)
Monocytes Relative: 9 %
Neutro Abs: 6.3 10*3/uL (ref 1.7–7.7)
Neutrophils Relative %: 71 %
Platelets: 319 10*3/uL (ref 150–400)
RBC: 5.14 MIL/uL (ref 4.22–5.81)
RDW: 15.9 % — ABNORMAL HIGH (ref 11.5–15.5)
WBC: 8.9 10*3/uL (ref 4.0–10.5)
nRBC: 0 % (ref 0.0–0.2)

## 2021-12-30 LAB — COMPREHENSIVE METABOLIC PANEL
ALT: 17 U/L (ref 0–44)
AST: 26 U/L (ref 15–41)
Albumin: 3.7 g/dL (ref 3.5–5.0)
Alkaline Phosphatase: 75 U/L (ref 38–126)
Anion gap: 7 (ref 5–15)
BUN: 19 mg/dL (ref 8–23)
CO2: 24 mmol/L (ref 22–32)
Calcium: 8.4 mg/dL — ABNORMAL LOW (ref 8.9–10.3)
Chloride: 108 mmol/L (ref 98–111)
Creatinine, Ser: 1.01 mg/dL (ref 0.61–1.24)
GFR, Estimated: >60 mL/min (ref >60)
Glucose, Bld: 173 mg/dL — ABNORMAL HIGH (ref 70–99)
Potassium: 4.2 mmol/L (ref 3.5–5.1)
Sodium: 139 mmol/L (ref 135–145)
Total Bilirubin: 0.3 mg/dL (ref 0.3–1.2)
Total Protein: 6.7 g/dL (ref 6.5–8.1)

## 2021-12-30 MED ORDER — IOHEXOL 300 MG/ML  SOLN
85.0000 mL | Freq: Once | INTRAMUSCULAR | Status: AC | PRN
  Administered 2021-12-30: 85 mL via INTRAVENOUS

## 2021-12-30 MED ORDER — HEPARIN SOD (PORK) LOCK FLUSH 100 UNIT/ML IV SOLN
500.0000 [IU] | Freq: Once | INTRAVENOUS | Status: AC
  Administered 2021-12-30: 500 [IU] via INTRAVENOUS

## 2021-12-31 LAB — CEA: CEA: 2.5 ng/mL (ref 0.0–4.7)

## 2022-01-01 ENCOUNTER — Encounter: Payer: Self-pay | Admitting: Oncology

## 2022-01-01 ENCOUNTER — Inpatient Hospital Stay (HOSPITAL_BASED_OUTPATIENT_CLINIC_OR_DEPARTMENT_OTHER): Payer: Medicare HMO | Admitting: Oncology

## 2022-01-01 VITALS — BP 148/51 | HR 72 | Temp 98.7°F | Resp 18 | Ht 65.0 in | Wt 153.0 lb

## 2022-01-01 DIAGNOSIS — Z8501 Personal history of malignant neoplasm of esophagus: Secondary | ICD-10-CM | POA: Diagnosis not present

## 2022-01-01 DIAGNOSIS — R918 Other nonspecific abnormal finding of lung field: Secondary | ICD-10-CM

## 2022-01-01 DIAGNOSIS — Z08 Encounter for follow-up examination after completed treatment for malignant neoplasm: Secondary | ICD-10-CM | POA: Diagnosis not present

## 2022-01-01 DIAGNOSIS — C159 Malignant neoplasm of esophagus, unspecified: Secondary | ICD-10-CM | POA: Diagnosis not present

## 2022-01-01 DIAGNOSIS — C155 Malignant neoplasm of lower third of esophagus: Secondary | ICD-10-CM | POA: Diagnosis not present

## 2022-01-01 NOTE — Assessment & Plan Note (Signed)
#  Esophageal adenocarcinoma, Stage IIB Patient is status post esophagectomy, adjuvant chemotherapy includes 1 cycle of Xeloda/oxaliplatin.  Additional chemotherapy was not completed due to multiple hospitalization due to infection and back surgery. Labs are reviewed and discussed with patient Showed no evidence of recurrence. Continue surveillance.  Obtain CT in 6 months.

## 2022-01-01 NOTE — Assessment & Plan Note (Signed)
Stable.  Recommend patient to follow-up with pulmonology Dr. Sood 

## 2022-01-01 NOTE — Progress Notes (Signed)
Hematology/Oncology Progress note [phone] [phone]      Patient Care Team: Idelle Crouch, MD as PCP - General (Internal Medicine) Christene Lye, MD (General Surgery) Clent Jacks, RN as Oncology Nurse Navigator Earlie Server, MD as Consulting Physician (Hematology and Oncology)  ASSESSMENT & PLAN:   Malignant neoplasm of lower third of esophagus [facility]) #Esophageal adenocarcinoma, Stage IIB Patient is status post esophagectomy, adjuvant chemotherapy includes 1 cycle of Xeloda/oxaliplatin.  Additional chemotherapy was not completed due to multiple hospitalization due to infection and back surgery. Labs are reviewed and discussed with patient Showed no evidence of recurrence. Continue surveillance.  Obtain CT in 6 months.  Lung nodules Stable.  Recommend patient to follow-up with pulmonology Dr. Halford Chessman  Orders Placed This Encounter  Procedures   CT CHEST ABDOMEN PELVIS W CONTRAST    Standing Status:   Future    Standing Expiration Date:   01/02/2023    Order Specific Question:   Preferred imaging location?    Answer:   [facility]    Order Specific Question:   Is Oral Contrast requested for this exam?    Answer:   Yes, Per Radiology protocol   CBC with Differential/Platelet    Standing Status:   Future    Standing Expiration Date:   01/02/2023   Comprehensive metabolic panel    Standing Status:   Future    Standing Expiration Date:   01/01/2023   CEA    Standing Status:   Future    Standing Expiration Date:   01/01/2023   Follow-up in 6 months. All questions were answered. The patient knows to call the clinic with any problems, questions or concerns.  Earlie Server, MD, PhD [facility] Hematology Oncology 01/01/2022   CHIEF COMPLAINTS/REASON FOR VISIT:  Follow up  esophageal cancer  HISTORY OF PRESENTING ILLNESS:   Blake [name] is a  76 y.o.  male with PMH listed below was seen in consultation at the request of  Doy Hutching Leonie Douglas, MD  for evaluation of esophageal cancer Patient has longstanding Barrett's esophagus. Oncology History  Malignant neoplasm of lower third of esophagus ([place])  07/14/2019 Initial Diagnosis   Patient has longstanding Barrett's esophagus. 03/21/2019 patient underwent surveillance endoscopy which showed esophageal mucosal changes secondary to long segment Barrett's disease present in the lower third of the esophagus.  Maximal longitudinal extent of these mucosal changes were 7 cm in length.  Mucosa was biopsied with cold forceps for histology in 4 quadrants at the interval of 2 cm in the lower third of esophagus.  2 cm hiatal hernia, gastritis Esophageal biopsy 3 out of the 4 biopsies were positive for intramucosal carcinoma involving Barrett's mucosa with high-grade dysplasia.  1 out of 4 esophagus biopsy showed Barrett's mucosa with erosion, indefinite for dysplasia  06/09/19, EMR at [facility].  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 EMR specimen was received in multiple fragments and margins can not be evaluated.  1 gastric polyps were biopsied which showed fundic gland polyp. Negative for dysplasia   08/08/19, to OR for EGD with sessile tumor from 29-33cm, at least 1.5 cm wide. Pathology with the following: 1) esophagus (30cm) - residual invasive adenocarcinoma, at least invasive to muscularis mucosa and 2) esophagus (30cm) - superficial fragments of highly dysplastic epithelium, focally suspicious for intramucosal carcinoma  09/12/2019, patient underwent esophagectomy with pathological revealed  Negative esophageal margin, gastric margin, esophagogastrectomy, invasive moderately differentiated esophageal adenocarcinoma involved distal esophageal and GE junction, tumor  focally invades the submucosa.  Left gastric lymph node x 4 negative, greater curvature lymph nodes 1/3 is positive for 30m metastatic carcinoma.  Periesophageal lymph node x 6, negative, final pathology  stage pT1b pn1, Esophageal adenocarcinoma, Stage IIB Lymph node positive diease, no neoadjuvant chemotherapy, recommended by  Dr. UFanny Skatesand was recommended for adjuvant chemotherapy.  Patient prefers to receive treatment closer to home and patient call back to establish care. No radiation was recommended due to the concern of dehiscence. 11/28/2019, patient had a stent removal.    12/09/2019 Cancer Staging   Staging form: Esophagus - Adenocarcinoma, AJCC 8th Edition - Pathologic stage from 12/09/2019: Stage IIB (pT1b, pN1, cM0, G2) - Signed by YEarlie Server MD on 12/09/2019   11/2019 -  Chemotherapy   September 2021, patient underwent Xeloda 1500 mg twice daily/oxaliplatin.  He reports not tolerating well due to nausea and diarrhea. 12/24/2019 patient's wife was diagnosed with COVID-19 infection and patient was tested negative.  Both patient and wife received monoclonal antibody treatments.  Patient prefers to switch to FOLFOX. He had Mediport placed on 01/12/2020 by Dr. OFaith Rogue Additional chemotherapy was interrupted and delayed due to his aspiration pneumonia, and later due to his preference of proceeding with back surgery first- see below.  He had x-ray prior and after this procedure.  Post x-ray showed extensive patchy consolidation throughout the right lung and a mild patchy opacity in the upper left lung.  Worsened   01/18/2020 -  Hospital Admission   Patient was advised to go to emergency room and was later admitted for pneumonia treatments.  There was concern of aspiration pneumonia.  Patient was evaluated by speech therapy.  No overt clinical signs of aspiration during p.o. intake.  Cannot rule out possibility of esophageal phase retrograde flow activity patient denies further swallowing evaluation.  Patient was discharged on 01/19/2020.   01/27/2020 Imaging   CT chest with contrast showed extensive nodular airspace process in the right lung with loss of volume and areas of subpleural atelectasis.   Likely polylobar pneumonia with aspiration.  No mediastinal or hilar mass or adenopathy.  Surgical changes   02/24/2020 Surgery    patient had L4-5 lumbar decompression, L3--4 microdiscectomy.  Per note he tolerates procedure very well. djuvant chemotherapy has been delayed significantly due to above reasons.  At that point, the benefit is uncertain to resume adjuvant chemotherapy 9 months after original surgery.   04/10/2020 Mammogram   PET scan showed progressive right lung area of dense consolidation and clustering nodularity with correlated hypermetabolic him.  Favor aspiration/chronic infection.  No signs of metastatic disease.  Decision was made to continue observation    06/26/2020 - 06/27/2020 Hospital Admission   admission due to chest pain. Toponin was negative, CXR negative.CT chest angiogram showed np PE, multifocal pneumonia through out right lung, improved. Increasing moderate left pleural effusion. Unchanged trace right pleural effusion.   07/12/2020 Imaging   CT abdomen pelvis with contrast - no findings of active malignancy. masslike structure in the left upper quadrant represents a confluent region of fat necrosis. Not hypermetabolic on previous PET. Nodular and bandlike opacities at right lung base have improved comparing to 06/26/2020. Substantial narrowing of right lowe lobe tracheobronchial tree. Other chronic findings.   07/16/2020 Surgery   07/16/2020 s/p spondylolisthesis   12/04/2020 Imaging   CT chest abdomen pelvis showed no evidence of new or progressive findings to suggest recurrent or metastatic disease.  Stable volume loss right hemithorax with diffuse micro nodularity in the  right lung. ? Atypical infection.  He declined pulmonology work up    06/29/2021 Imaging   06/29/2021, CT chest abdomen pelvis with contrast was reviewed by me and discussed with patient No evidence of recurrent or metastatic disease.  extensive posterior centrilobular and tree-in-bud nodularity  throughout the right lung.  With areas of peripheral consolidation.  Unchanged and is consistent with atypical infection   12/30/2021 Imaging   CT chest abdomen pelvis w contrast 1. Status post esophagectomy and gastric pull-through. No evidence of local recurrence or metastatic disease. 2. Numerous solid nodules are seen throughout the right hemithorax,not significantly changed when compared with prior exam and likely due to chronic atypical infection. 3. No evidence of metastatic disease in the abdomen or pelvis. 4. Aortic Atherosclerosis (ICD10-I70.0) and Emphysema (ICD10-J43.9    Esophageal carcinoma (HCC)  12/09/2019 Initial Diagnosis   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[place])   12/19/2019 - 12/19/2019 Chemotherapy   The patient had capecitabine (XELODA) 150 MG tablet, 300 mg (100 % of original dose 300 mg), Oral, 2 times daily, 1 of 1 cycle, Start date: 12/12/2019, End date: -- Dose modification: 300 mg (original dose 300 mg, Cycle 0) capecitabine (XELODA) 500 MG tablet, 1,500 mg (100 % of original dose 1,500 mg), Oral, 2 times daily, 1 of 1 cycle, Start date: 12/12/2019, End date: -- Dose modification: 1,500 mg (original dose 1,500 mg, Cycle 0) palonosetron (ALOXI) injection 0.25 mg, 0.25 mg, Intravenous,  Once, 1 of 8 cycles Administration: 0.25 mg (12/19/2019) oxaliplatin (ELOXATIN) 240 mg in dextrose 5 % 500 mL chemo infusion, 130 mg/m2 = 240 mg, Intravenous,  Once, 1 of 8 cycles Administration: 240 mg (12/19/2019)  for chemotherapy treatment.    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 Jeb Levering.  His initial PET scan 11/08/2004 showed abnormal localization in a right neck mass possibly a lymph node, slightly asymmetric localization at the base of the tongue.  Pathology is not available in current  EMR.    INTERVAL HISTORY Blake [name] is a 76 y.o. male who has above history reviewed by me today presents for follow up visit for management of esophageal cancer.  Patient reports feeling well.  Manageable GERD symptoms.  Denies any unintentional weight loss, abdominal pain, nausea vomiting.  Review of Systems  Constitutional:  Negative for appetite change, chills, diaphoresis, fatigue, fever and unexpected weight change.  HENT:   Negative for hearing loss, lump/mass, nosebleeds, sore throat and voice change.   Eyes:  Negative for eye problems and icterus.  Respiratory:  Negative for chest tightness, cough, hemoptysis, shortness of breath and wheezing.   Cardiovascular:  Negative for chest pain and leg swelling.  Gastrointestinal:  Negative for abdominal distention, abdominal pain, blood in stool, diarrhea, nausea and rectal pain.  Endocrine: Negative for hot flashes.  Genitourinary:  Negative for bladder incontinence, difficulty urinating, dysuria, frequency, hematuria and nocturia.   Musculoskeletal:  Negative for arthralgias, back pain, flank pain, gait problem and myalgias.  Skin:  Negative for itching and rash.  Neurological:  Negative for dizziness, gait problem, headaches, light-headedness, numbness and seizures.  Hematological:  Negative for adenopathy. Does not bruise/bleed easily.  Psychiatric/Behavioral:  Negative for confusion and decreased concentration. The patient is not nervous/anxious.     MEDICAL HISTORY:  Past Medical History:  Diagnosis Date  Arthritis    Back pain    with leg pain   Basal cell carcinoma 09/04/2009   R cheek 5.5 cm ant to earlobe - 12/26/2009 excision   Basal cell carcinoma 09/04/2009   R ant nasal alar rim   Basal cell carcinoma 07/31/2014   R mid brow   Basal cell carcinoma 07/30/2016   R ant nasal alar rim at ant edge of BCC scar   Basal cell carcinoma 11/02/2019   L zygoma    Basal cell carcinoma 07/22/2021   left sideburn area,  EDC   Cancer of base of tongue ([place]) 2006   s/p chemoradiation   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, bilateral    Dyspnea    Dysrhythmia    Esophageal cancer ([place]) 2021   GERD (gastroesophageal reflux disease)    Hyperlipidemia    Hypertension    Numbness and tingling of both lower extremities    with positioning   Paroxysmal atrial fibrillation ([place])    Port-A-Cath in place 2006   Prostate enlargement    Squamous cell carcinoma of skin 12/14/2019   L pretibial - ED&C    T2DM (type 2 diabetes mellitus) ([place])     SURGICAL HISTORY: Past Surgical History:  Procedure Laterality Date   ABDOMINAL SURGERY     CAROTID PTA/STENT INTERVENTION Right 07/04/2021   Procedure: CAROTID PTA/STENT INTERVENTION;  Surgeon: Algernon Huxley, MD;  Location: [facility];  Service: Cardiovascular;  Laterality: Right;   COLONOSCOPY  12/08/2003   COLONOSCOPY WITH PROPOFOL N/A 03/21/2019   Procedure: COLONOSCOPY WITH PROPOFOL;  Surgeon: Toledo, Benay Pike, MD;  Location: [facility];  Service: Gastroenterology;  Laterality: N/A;   ESOPHAGOGASTRODUODENOSCOPY (EGD) WITH PROPOFOL N/A 03/21/2019   Procedure: ESOPHAGOGASTRODUODENOSCOPY (EGD) WITH PROPOFOL;  Surgeon: Toledo, Benay Pike, MD;  Location: [facility];  Service: Gastroenterology;  Laterality: N/A;   esophogeal cancer     JOINT REPLACEMENT Right    total hip   LUMBAR LAMINECTOMY/DECOMPRESSION MICRODISCECTOMY N/A 03/03/2018   Procedure: LUMBAR LAMINECTOMY/DECOMPRESSION MICRODISCECTOMY 1 LEVEL-L3-4,L4-5;  Surgeon: Meade Maw, MD;  Location: [facility];  Service: Neurosurgery;  Laterality: N/A;   LUMBAR LAMINECTOMY/DECOMPRESSION MICRODISCECTOMY N/A 02/24/2020   Procedure: LEFT L3-4 MICRODISCECTOMY, L4-5 DECOMPRESSION;  Surgeon: Meade Maw, MD;  Location: [facility];  Service: Neurosurgery;  Laterality: N/A;   MAXIMUM ACCESS (MAS) TRANSFORAMINAL LUMBAR INTERBODY FUSION (TLIF) 1 LEVEL N/A 07/13/2020   Procedure: OPEN L3-4  TRANSFORAMINAL LUMBAR INTERBODY FUSION (TLIF);  Surgeon: Meade Maw, MD;  Location: [facility];  Service: Neurosurgery;  Laterality: N/A;   PARTIAL HIP ARTHROPLASTY Right 2014   PORT-A-CATH REMOVAL     PORTACATH PLACEMENT Left 01/12/2020   Procedure: INSERTION PORT-A-CATH, with ultrasound fluoroscopy;  Surgeon: Nestor Lewandowsky, MD;  Location: [facility];  Service: General;  Laterality: Left;   TONGUE BIOPSY  2006   TONSILLECTOMY     TRIGGER FINGER RELEASE Right 2013   UPPER GI ENDOSCOPY  01/25/14   multiple gastric polyps    SOCIAL HISTORY: Social History   Socioeconomic History   Marital status: Widowed    Spouse name: Not on file   Number of children: 1   Years of education: Not on file   Highest education level: Not on file  Occupational History   Not on file  Tobacco Use   Smoking status: Former    Packs/day: 1.00    Years: 30.00    Total pack years: 30.00    Types: Cigarettes    Quit date: 02/18/1994  Years since quitting: 27.8   Smokeless tobacco: Never  Vaping Use   Vaping Use: Never used  Substance and Sexual Activity   Alcohol use: Not Currently   Drug use: No   Sexual activity: Yes  Other Topics Concern   Not on file  Social History Narrative   Lives by himself. Friends in the area. Son Gerald Stabs lives in [place].    Social Determinants of Health   Financial Resource Strain: Not on file  Food Insecurity: Not on file  Transportation Needs: Not on file  Physical Activity: Not on file  Stress: Not on file  Social Connections: Not on file  Intimate Partner Violence: Not on file    FAMILY HISTORY: Family History  Problem Relation Age of Onset   Diabetes Mother    Congestive Heart Failure Mother    Breast cancer Mother    Lung cancer Father     ALLERGIES:  is allergic to sulfa antibiotics.  MEDICATIONS:  Current Outpatient Medications  Medication Sig Dispense Refill   aspirin 81 MG EC tablet Take 1 tablet (81 mg total) by mouth daily at 6 (six) AM.  Swallow whole. 30 tablet 11   clopidogrel (PLAVIX) 75 MG tablet Take 1 tablet (75 mg total) by mouth daily. 30 tablet 6   lisinopril (ZESTRIL) 10 MG tablet TAKE 1 TABLET (10 MG TOTAL) BY MOUTH DAILY. 30 tablet 3   methocarbamol (ROBAXIN) 500 MG tablet Take 1 and 1/2 tablets (750 mg total) by mouth every 6 (six) hours as needed for muscle spasms. 90 tablet 1   metoprolol succinate (TOPROL-XL) 100 MG 24 hr tablet Take 2 tablets (200 mg total) by mouth once daily 180 tablet 1   mirtazapine (REMERON) 15 MG tablet Take 1 tablet (15 mg total) by mouth nightly 30 tablet 11   mupirocin ointment (BACTROBAN) 2 % Apply 1 Application topically daily. Qd to excision site 22 g 1   omeprazole (PRILOSEC) 40 MG capsule Take 40 mg by mouth daily.  3   omeprazole (PRILOSEC) 40 MG capsule Take 1 capsule (40 mg total) by mouth once daily 90 capsule 3   tamsulosin (FLOMAX) 0.4 MG CAPS capsule TAKE 1 CAPSULE BY MOUTH ONCE DAILY. TAKE 30 MINUTES AFTER SAME MEAL EACH DAY. 90 capsule 3   atorvastatin (LIPITOR) 80 MG tablet TAKE 1 TABLET BY MOUTH ONCE DAILY 90 tablet 3   No current facility-administered medications for this visit.     PHYSICAL EXAMINATION: ECOG PERFORMANCE STATUS: 1 - Symptomatic but completely ambulatory Vitals:   01/01/22 1320  BP: (!) 148/51  Pulse: 72  Resp: 18  Temp: 98.7 F (37.1 C)  SpO2: 98%   Filed Weights   01/01/22 1315  Weight: 153 lb (69.4 kg)    Physical Exam Constitutional:      General: He is not in acute distress. HENT:     Head: Normocephalic and atraumatic.  Eyes:     General: No scleral icterus.    Pupils: Pupils are equal, round, and reactive to light.  Cardiovascular:     Rate and Rhythm: Normal rate and regular rhythm.     Heart sounds: Normal heart sounds.  Pulmonary:     Effort: Pulmonary effort is normal. No respiratory distress.     Breath sounds: No wheezing.  Abdominal:     General: Bowel sounds are normal. There is no distension.     Palpations:  Abdomen is soft. There is no mass.     Tenderness: There is no abdominal  tenderness.  Musculoskeletal:        General: No deformity. Normal range of motion.     Cervical back: Normal range of motion and neck supple.  Skin:    General: Skin is warm and dry.     Findings: No erythema or rash.  Neurological:     Mental Status: He is alert and oriented to person, place, and time. Mental status is at baseline.     Cranial Nerves: No cranial nerve deficit.     Coordination: Coordination normal.  Psychiatric:        Mood and Affect: Mood normal.        Behavior: Behavior normal.        Thought Content: Thought content normal.     LABORATORY DATA:  I have reviewed the data as listed Lab Results  Component Value Date   WBC 8.9 12/30/2021   HGB 12.4 (L) 12/30/2021   HCT 40.9 12/30/2021   MCV 79.6 (L) 12/30/2021   PLT 319 12/30/2021   Recent Labs    01/25/21 1310 05/23/21 1314 06/28/21 0856 07/04/21 0801 07/05/21 0516 12/30/21 0833  NA 137  --  136  --  138 139  K 4.2  --  4.4  --  4.0 4.2  CL 104  --  102  --  101 108  CO2 27  --  28  --  27 24  GLUCOSE 233*  --  154*  --  114* 173*  BUN 16  --  '17 20 18 19  '$ CREATININE 1.00   < > 1.02 0.91 1.04 1.01  CALCIUM 8.1*  --  8.9  --  8.4* 8.4*  GFRNONAA >60  --  >60 >60 >60 >60  PROT 6.3*  --  6.6  --   --  6.7  ALBUMIN 3.4*  --  4.0  --   --  3.7  AST 22  --  18  --   --  26  ALT 21  --  15  --   --  17  ALKPHOS 87  --  78  --   --  75  BILITOT 0.6  --  0.3  --   --  0.3   < > = values in this interval not displayed.    Iron/TIBC/Ferritin/ %Sat No results found for: "IRON", "TIBC", "FERRITIN", "IRONPCTSAT"    RADIOGRAPHIC STUDIES: I have personally reviewed the radiological images as listed and agreed with the findings in the report. CT CHEST ABDOMEN PELVIS W CONTRAST  Result Date: 12/31/2021 CLINICAL DATA:  Esophageal cancer; * Tracking Code: BO * EXAM: CT CHEST, ABDOMEN, AND PELVIS WITH CONTRAST TECHNIQUE:  Multidetector CT imaging of the chest, abdomen and pelvis was performed following the standard protocol during bolus administration of intravenous contrast. RADIATION DOSE REDUCTION: This exam was performed according to the departmental dose-optimization program which includes automated exposure control, adjustment of the mA and/or kV according to patient size and/or use of iterative reconstruction technique. CONTRAST:  78m OMNIPAQUE IOHEXOL 300 MG/ML  SOLN COMPARISON:  CT chest, abdomen and pelvis dated June 28, 2021 FINDINGS: CT CHEST FINDINGS Cardiovascular: Normal heart size. No pericardial effusion. Moderate coronary artery calcifications of the LAD. Normal caliber thoracic aorta with moderate atherosclerotic disease. Mediastinum/Nodes: Status post esophagectomy and gastric pull-through. No evidence of suspicious nodular soft tissue. No pathologically enlarged lymph nodes seen in the chest. Lungs/Pleura: Central airways are patent. Mild paraseptal emphysema. Numerous small solid nodules are seen throughout the right hemithorax, not significantly changed  when compared with prior exam. Reference solid nodule of the superior portion of the right upper lobe measuring 1.3 x 0.8 cm on series 3, image 67, unchanged. Reference nodule on the right middle lobe measuring 9 mm on image 77, unchanged. No pleural effusion or pneumothorax. Musculoskeletal: No chest wall mass or suspicious bone lesions identified. CT ABDOMEN PELVIS FINDINGS Hepatobiliary: No focal liver abnormality is seen. No gallstones, gallbladder wall thickening, or biliary dilatation. Pancreas: Unremarkable. No pancreatic ductal dilatation or surrounding inflammatory changes. Spleen: Normal in size without focal abnormality. Adrenals/Urinary Tract: Bilateral adrenal glands are unremarkable. No hydronephrosis. Punctate nonobstructing stone at the left kidney. Left renal scarring. No suspicious renal lesions. Bladder is unremarkable. Stomach/Bowel:  Appendix appears normal. Mild diverticulosis. No evidence of bowel wall thickening, distention, or inflammatory changes. Vascular/Lymphatic: Aortic atherosclerosis. No enlarged abdominal or pelvic lymph nodes. Reproductive: Prostate is unremarkable. Other: Low-attenuation lesion of the left upper quadrant measuring 7.7 x 3.8 cm, unchanged when compared with prior exam, demonstrated areas of fat density January 17, 2019 prior and likely sequela of prior fat necrosis. No abdominopelvic ascites. Musculoskeletal: Prior left total hip arthroplasty and posterior fusion of L3-L4. Stable lucent lesions of the L1 and L2 vertebral bodies which are likely hemangiomas. No aggressive appearing osseous lesions. IMPRESSION: 1. Status post esophagectomy and gastric pull-through. No evidence of local recurrence or metastatic disease. 2. Numerous solid nodules are seen throughout the right hemithorax, not significantly changed when compared with prior exam and likely due to chronic atypical infection. 3. No evidence of metastatic disease in the abdomen or pelvis. 4. Aortic Atherosclerosis (ICD10-I70.0) and Emphysema (ICD10-J43.9). Electronically Signed   By: Yetta Glassman M.D.   On: 12/31/2021 12:27   VAS US CAROTID  Result Date: 10/29/2021 Carotid Arterial Duplex Study Patient Name:  Blake [name]  Date of Exam:   10/29/2021 Medical Rec #: [id number]       Accession #:    [id number] Date of Birth: 1946-03-17      Patient Gender: M Patient Age:   7 years Exam Location:  [facility] Procedure:      VAS US CAROTID Referring Phys: Leotis Pain --------------------------------------------------------------------------------  Indications:       Carotid artery disease. Other Factors:     07/04/2021: Placement of a 9 mm proximal, 7 mm distal, 4 cm                    long exact stent with the use of the NAV 6 embolic protection                    device in the Right Caroti Artery. Comparison Study:  07/26/2021 Performing  Technologist: Almira Coaster RVS  Examination Guidelines: A complete evaluation includes B-mode imaging, spectral Doppler, color Doppler, and power Doppler as needed of all accessible portions of each vessel. Bilateral testing is considered an integral part of a complete examination. Limited examinations for reoccurring indications may be performed as noted.  Right Carotid Findings: +----------+--------+--------+--------+------------------+--------+           PSV cm/sEDV cm/sStenosisPlaque DescriptionComments +----------+--------+--------+--------+------------------+--------+ CCA Prox  102     24                                         +----------+--------+--------+--------+------------------+--------+ CCA Mid   89      19                                         +----------+--------+--------+--------+------------------+--------+  CCA Distal104     23                                         +----------+--------+--------+--------+------------------+--------+ ICA Prox  83      19                                         +----------+--------+--------+--------+------------------+--------+ ICA Mid   82      24                                         +----------+--------+--------+--------+------------------+--------+ ICA Distal114     23                                         +----------+--------+--------+--------+------------------+--------+ ECA       118     17                                         +----------+--------+--------+--------+------------------+--------+ +----------+--------+-------+--------+-------------------+           PSV cm/sEDV cmsDescribeArm Pressure (mmHG) +----------+--------+-------+--------+-------------------+ Subclavian122     0                                  +----------+--------+-------+--------+-------------------+ +---------+--------+--+--------+--+ VertebralPSV cm/s85EDV cm/s19 +---------+--------+--+--------+--+   Left Carotid Findings: +---------+--------+-------+--------+---------------------------------+--------+          PSV cm/sEDV    StenosisPlaque Description               Comments                  cm/s                                                     +---------+--------+-------+--------+---------------------------------+--------+ CCA Prox 137     18                                                       +---------+--------+-------+--------+---------------------------------+--------+ CCA Mid  136     18                                                       +---------+--------+-------+--------+---------------------------------+--------+ CCA      132     24  Distal                                                                    +---------+--------+-------+--------+---------------------------------+--------+ ICA Prox 99      16             heterogenous, irregular and                                               calcific                                  +---------+--------+-------+--------+---------------------------------+--------+ ICA Mid  212     49                                                       +---------+--------+-------+--------+---------------------------------+--------+ ICA      100     28                                                       Distal                                                                    +---------+--------+-------+--------+---------------------------------+--------+ ECA      127     17             heterogenous, irregular and                                               calcific                                  +---------+--------+-------+--------+---------------------------------+--------+ +----------+--------+--------+--------+-------------------+           PSV cm/sEDV cm/sDescribeArm Pressure (mmHG)  +----------+--------+--------+--------+-------------------+ Subclavian307     0                                   +----------+--------+--------+--------+-------------------+ +---------+--------+--+--------+-+ VertebralPSV cm/s38EDV cm/s9 +---------+--------+--+--------+-+   Summary: Right Carotid: Velocities in the right ICA are consistent with a 1-39% stenosis.                Hemodynamically significant plaque >50% visualized in the CCA. Left Carotid: Velocities in the left ICA are consistent with a 40-59% stenosis. Vertebrals:  Bilateral  vertebral arteries demonstrate antegrade flow. Subclavians: Normal flow hemodynamics were seen in bilateral subclavian              arteries. *See table(s) above for measurements and observations.  Electronically signed by Leotis Pain MD on 10/29/2021 at 3:51:04 PM.    Final

## 2022-01-02 ENCOUNTER — Encounter: Payer: Self-pay | Admitting: Dermatology

## 2022-01-02 ENCOUNTER — Ambulatory Visit: Payer: Medicare HMO | Admitting: Dermatology

## 2022-01-02 DIAGNOSIS — L821 Other seborrheic keratosis: Secondary | ICD-10-CM

## 2022-01-02 DIAGNOSIS — L814 Other melanin hyperpigmentation: Secondary | ICD-10-CM | POA: Diagnosis not present

## 2022-01-02 DIAGNOSIS — C44612 Basal cell carcinoma of skin of right upper limb, including shoulder: Secondary | ICD-10-CM

## 2022-01-02 DIAGNOSIS — L578 Other skin changes due to chronic exposure to nonionizing radiation: Secondary | ICD-10-CM | POA: Diagnosis not present

## 2022-01-02 DIAGNOSIS — C4441 Basal cell carcinoma of skin of scalp and neck: Secondary | ICD-10-CM

## 2022-01-02 DIAGNOSIS — D492 Neoplasm of unspecified behavior of bone, soft tissue, and skin: Secondary | ICD-10-CM

## 2022-01-02 DIAGNOSIS — C44319 Basal cell carcinoma of skin of other parts of face: Secondary | ICD-10-CM

## 2022-01-02 NOTE — Patient Instructions (Signed)
Electrodesiccation and Curettage ("Scrape and Burn") Wound Care Instructions  Leave the original bandage on for 24 hours if possible.  If the bandage becomes soaked or soiled before that time, it is OK to remove it and examine the wound.  A small amount of post-operative bleeding is normal.  If excessive bleeding occurs, remove the bandage, place gauze over the site and apply continuous pressure (no peeking) over the area for 30 minutes. If this does not work, please call our clinic as soon as possible or page your doctor if it is after hours.   Once a day, cleanse the wound with soap and water. It is fine to shower. If a thick crust develops you may use a Q-tip dipped into dilute hydrogen peroxide (mix 1:1 with water) to dissolve it.  Hydrogen peroxide can slow the healing process, so use it only as needed.    After washing, apply petroleum jelly (Vaseline) or an antibiotic ointment if your doctor prescribed one for you, followed by a bandage.    For best healing, the wound should be covered with a layer of ointment at all times. If you are not able to keep the area covered with a bandage to hold the ointment in place, this may mean re-applying the ointment several times a day.  Continue this wound care until the wound has healed and is no longer open. It may take several weeks for the wound to heal and close.  Itching and mild discomfort is normal during the healing process.  If you have any discomfort, you can take Tylenol (acetaminophen) or ibuprofen as directed on the bottle. (Please do not take these if you have an allergy to them or cannot take them for another reason).  Some redness, tenderness and white or yellow material in the wound is normal healing.  If the area becomes very sore and red, or develops a thick yellow-green material (pus), it may be infected; please notify us.    Wound healing continues for up to one year following surgery. It is not unusual to experience pain in the scar  from time to time during the interval.  If the pain becomes severe or the scar thickens, you should notify the office.    A slight amount of redness in a scar is expected for the first six months.  After six months, the redness will fade and the scar will soften and fade.  The color difference becomes less noticeable with time.  If there are any problems, return for a post-op surgery check at your earliest convenience.  To improve the appearance of the scar, you can use silicone scar gel, cream, or sheets (such as Mederma or Serica) every night for up to one year. These are available over the counter (without a prescription).  Please call our office at [phone] for any questions or concerns.     Due to recent changes in healthcare laws, you may see results of your pathology and/or laboratory studies on MyChart before the doctors have had a chance to review them. We understand that in some cases there may be results that are confusing or concerning to you. Please understand that not all results are received at the same time and often the doctors may need to interpret multiple results in order to provide you with the best plan of care or course of treatment. Therefore, we ask that you please give us 2 business days to thoroughly review all your results before contacting the office for clarification. Should   we see a critical lab result, you will be contacted sooner.   If You Need Anything After Your Visit  If you have any questions or concerns for your doctor, please call our main line at [phone] and press option 4 to reach your doctor's medical assistant. If no one answers, please leave a voicemail as directed and we will return your call as soon as possible. Messages left after 4 pm will be answered the following business day.   You may also send us a message via MyChart. We typically respond to MyChart messages within 1-2 business days.  For prescription refills, please ask your  pharmacy to contact our office. Our fax number is [phone].  If you have an urgent issue when the clinic is closed that cannot wait until the next business day, you can page your doctor at the number below.    Please note that while we do our best to be available for urgent issues outside of office hours, we are not available 24/7.   If you have an urgent issue and are unable to reach us, you may choose to seek medical care at your doctor's office, retail clinic, urgent care center, or emergency room.  If you have a medical emergency, please immediately call 911 or go to the emergency department.  Pager Numbers  - Dr. Kowalski: [phone]  - Dr. Moye: [phone]  - Dr. Stewart: [phone]  In the event of inclement weather, please call our main line at [phone] for an update on the status of any delays or closures.  Dermatology Medication Tips: Please keep the boxes that topical medications come in in order to help keep track of the instructions about where and how to use these. Pharmacies typically print the medication instructions only on the boxes and not directly on the medication tubes.   If your medication is too expensive, please contact our office at [phone] option 4 or send us a message through MyChart.   We are unable to tell what your co-pay for medications will be in advance as this is different depending on your insurance coverage. However, we may be able to find a substitute medication at lower cost or fill out paperwork to get insurance to cover a needed medication.   If a prior authorization is required to get your medication covered by your insurance company, please allow us 1-2 business days to complete this process.  Drug prices often vary depending on where the prescription is filled and some pharmacies may offer cheaper prices.  The website www.goodrx.com contains coupons for medications through different pharmacies. The prices here do not  account for what the cost may be with help from insurance (it may be cheaper with your insurance), but the website can give you the price if you did not use any insurance.  - You can print the associated coupon and take it with your prescription to the pharmacy.  - You may also stop by our office during regular business hours and pick up a GoodRx coupon card.  - If you need your prescription sent electronically to a different pharmacy, notify our office through [facility] MyChart or by phone at [phone] option 4.     Si Usted Necesita Algo Despus de Su Visita  Tambin puede enviarnos un mensaje a travs de MyChart. Por lo general respondemos a los mensajes de MyChart en el transcurso de 1 a 2 das hbiles.  Para renovar recetas, por favor pida a su farmacia que se ponga en   contacto con nuestra oficina. Nuestro nmero de fax es el [phone].  Si tiene un asunto urgente cuando la clnica est cerrada y que no puede esperar hasta el siguiente da hbil, puede llamar/localizar a su doctor(a) al nmero que aparece a continuacin.   Por favor, tenga en cuenta que aunque hacemos todo lo posible para estar disponibles para asuntos urgentes fuera del horario de oficina, no estamos disponibles las 24 horas del da, los 7 das de la semana.   Si tiene un problema urgente y no puede comunicarse con nosotros, puede optar por buscar atencin mdica  en el consultorio de su doctor(a), en una clnica privada, en un centro de atencin urgente o en una sala de emergencias.  Si tiene una emergencia mdica, por favor llame inmediatamente al 911 o vaya a la sala de emergencias.  Nmeros de bper  - Dr. Kowalski: [phone]  - Dra. Moye: [phone]  - Dra. Stewart: [phone]  En caso de inclemencias del tiempo, por favor llame a nuestra lnea principal al [phone] para una actualizacin sobre el estado de cualquier retraso o cierre.  Consejos para la medicacin en dermatologa: Por  favor, guarde las cajas en las que vienen los medicamentos de uso tpico para ayudarle a seguir las instrucciones sobre dnde y cmo usarlos. Las farmacias generalmente imprimen las instrucciones del medicamento slo en las cajas y no directamente en los tubos del medicamento.   Si su medicamento es muy caro, por favor, pngase en contacto con nuestra oficina llamando al [phone] y presione la opcin 4 o envenos un mensaje a travs de MyChart.   No podemos decirle cul ser su copago por los medicamentos por adelantado ya que esto es diferente dependiendo de la cobertura de su seguro. Sin embargo, es posible que podamos encontrar un medicamento sustituto a menor costo o llenar un formulario para que el seguro cubra el medicamento que se considera necesario.   Si se requiere una autorizacin previa para que su compaa de seguros cubra su medicamento, por favor permtanos de 1 a 2 das hbiles para completar este proceso.  Los precios de los medicamentos varan con frecuencia dependiendo del lugar de dnde se surte la receta y alguna farmacias pueden ofrecer precios ms baratos.  El sitio web www.goodrx.com tiene cupones para medicamentos de diferentes farmacias. Los precios aqu no tienen en cuenta lo que podra costar con la ayuda del seguro (puede ser ms barato con su seguro), pero el sitio web puede darle el precio si no utiliz ningn seguro.  - Puede imprimir el cupn correspondiente y llevarlo con su receta a la farmacia.  - Tambin puede pasar por nuestra oficina durante el horario de atencin regular y recoger una tarjeta de cupones de GoodRx.  - Si necesita que su receta se enve electrnicamente a una farmacia diferente, informe a nuestra oficina a travs de MyChart de [facility] o por telfono llamando al [phone] y presione la opcin 4.  

## 2022-01-02 NOTE — Progress Notes (Signed)
Follow-Up Visit   Subjective  Blake [name] is a 76 y.o. male who presents for the following: Actinic Keratosis (Recheck right preauricular. Tx with LN2 10/23/2021. Has not resolved. Has 2 other areas that bleed on right anterior shoulder and right upper back). The patient has spots, moles and lesions to be evaluated, some may be new or changing and the patient has concerns that these could be cancer.  The following portions of the chart were reviewed this encounter and updated as appropriate:  Tobacco  Allergies  Meds  Problems  Med Hx  Surg Hx  Fam Hx     Review of Systems: No other skin or systemic complaints except as noted in HPI or Assessment and Plan.  Objective  Well appearing patient in no apparent distress; mood and affect are within normal limits.  A focused examination was performed including face, torso. Relevant physical exam findings are noted in the Assessment and Plan.  Right Supraclavicular Base of Neck 1.5cm pink plaque with rolled borders and central ulceration     Right posterior shoulder 1.5 erythematous scaly patch with central erosion     Right Preauricular Area 2.1cm erythematous scaly plaque      Assessment & Plan  Neoplasm of skin (3) Right Supraclavicular Base of Neck Epidermal / dermal shaving  Lesion diameter (cm):  1.5 Informed consent: discussed and consent obtained   Timeout: patient name, date of birth, surgical site, and procedure verified   Procedure prep:  Patient was prepped and draped in usual sterile fashion Prep type:  Isopropyl alcohol Anesthesia: the lesion was anesthetized in a standard fashion   Anesthetic:  1% lidocaine w/ epinephrine 1-100,000 buffered w/ 8.4% NaHCO3 Instrument used: flexible razor blade   Hemostasis achieved with: pressure, aluminum chloride and electrodesiccation   Outcome: patient tolerated procedure well   Post-procedure details: sterile dressing applied and wound care instructions given    Dressing type: bandage and petrolatum    Destruction of lesion Complexity: extensive   Destruction method: electrodesiccation and curettage   Informed consent: discussed and consent obtained   Timeout:  patient name, date of birth, surgical site, and procedure verified Procedure prep:  Patient was prepped and draped in usual sterile fashion Prep type:  Isopropyl alcohol Anesthesia: the lesion was anesthetized in a standard fashion   Anesthetic:  1% lidocaine w/ epinephrine 1-100,000 buffered w/ 8.4% NaHCO3 Curettage performed in three different directions: Yes   Electrodesiccation performed over the curetted area: Yes   Curettage cycles:  3 Lesion length (cm):  1.5 Lesion width (cm):  1.5 Margin per side (cm):  0.2 Final wound size (cm):  1.9 Hemostasis achieved with:  pressure and aluminum chloride Outcome: patient tolerated procedure well with no complications   Post-procedure details: sterile dressing applied and wound care instructions given   Dressing type: bandage and petrolatum    Specimen 1 - Surgical pathology Differential Diagnosis: R/O BCC Check Margins: No  Right posterior shoulder Epidermal / dermal shaving  Lesion diameter (cm):  1.5 Informed consent: discussed and consent obtained   Timeout: patient name, date of birth, surgical site, and procedure verified   Procedure prep:  Patient was prepped and draped in usual sterile fashion Prep type:  Isopropyl alcohol Anesthesia: the lesion was anesthetized in a standard fashion   Anesthetic:  1% lidocaine w/ epinephrine 1-100,000 buffered w/ 8.4% NaHCO3 Instrument used: flexible razor blade   Hemostasis achieved with: pressure, aluminum chloride and electrodesiccation   Outcome: patient tolerated procedure well  Post-procedure details: sterile dressing applied and wound care instructions given   Dressing type: bandage and petrolatum    Destruction of lesion Complexity: extensive   Destruction method:  electrodesiccation and curettage   Informed consent: discussed and consent obtained   Timeout:  patient name, date of birth, surgical site, and procedure verified Procedure prep:  Patient was prepped and draped in usual sterile fashion Prep type:  Isopropyl alcohol Anesthesia: the lesion was anesthetized in a standard fashion   Anesthetic:  1% lidocaine w/ epinephrine 1-100,000 buffered w/ 8.4% NaHCO3 Curettage performed in three different directions: Yes   Electrodesiccation performed over the curetted area: Yes   Curettage cycles:  3 Lesion length (cm):  1.5 Lesion width (cm):  1.5 Margin per side (cm):  0.2 Final wound size (cm):  1.9 Hemostasis achieved with:  pressure and aluminum chloride Outcome: patient tolerated procedure well with no complications   Post-procedure details: sterile dressing applied and wound care instructions given   Dressing type: bandage and petrolatum    Specimen 2 - Surgical pathology Differential Diagnosis: R/O BCC Check Margins: No  Right Preauricular Area Epidermal / dermal shaving  Lesion diameter (cm):  2.1 Informed consent: discussed and consent obtained   Timeout: patient name, date of birth, surgical site, and procedure verified   Procedure prep:  Patient was prepped and draped in usual sterile fashion Prep type:  Isopropyl alcohol Anesthesia: the lesion was anesthetized in a standard fashion   Anesthetic:  1% lidocaine w/ epinephrine 1-100,000 buffered w/ 8.4% NaHCO3 Instrument used: flexible razor blade   Hemostasis achieved with: pressure, aluminum chloride and electrodesiccation   Outcome: patient tolerated procedure well   Post-procedure details: sterile dressing applied and wound care instructions given   Dressing type: bandage and petrolatum    Destruction of lesion Complexity: extensive   Destruction method: electrodesiccation and curettage   Informed consent: discussed and consent obtained   Timeout:  patient name, date of  birth, surgical site, and procedure verified Procedure prep:  Patient was prepped and draped in usual sterile fashion Prep type:  Isopropyl alcohol Anesthesia: the lesion was anesthetized in a standard fashion   Anesthetic:  1% lidocaine w/ epinephrine 1-100,000 buffered w/ 8.4% NaHCO3 Curettage performed in three different directions: Yes   Electrodesiccation performed over the curetted area: Yes   Curettage cycles:  3 Lesion length (cm):  2.1 Lesion width (cm):  2.1 Margin per side (cm):  0.3 Final wound size (cm):  2.7 Hemostasis achieved with:  pressure and aluminum chloride Outcome: patient tolerated procedure well with no complications   Post-procedure details: sterile dressing applied and wound care instructions given   Dressing type: bandage and petrolatum    Specimen 3 - Surgical pathology Differential Diagnosis: R/O BCC Check Margins: No  Related Medications mupirocin ointment (BACTROBAN) 2 % Apply 1 Application topically daily. Qd to excision site  Actinic Damage - chronic, secondary to cumulative UV radiation exposure/sun exposure over time - diffuse scaly erythematous macules with underlying dyspigmentation - Recommend daily broad spectrum sunscreen SPF 30+ to sun-exposed areas, reapply every 2 hours as needed.  - Recommend staying in the shade or wearing long sleeves, sun glasses (UVA+UVB protection) and wide brim hats (4-inch brim around the entire circumference of the hat). - Call for new or changing lesions.  Lentigines - Scattered tan macules - Due to sun exposure - Benign-appearing, observe - Recommend daily broad spectrum sunscreen SPF 30+ to sun-exposed areas, reapply every 2 hours as needed. - Call for any  changes  Seborrheic Keratoses - Stuck-on, waxy, tan-brown papules and/or plaques  - Benign-appearing - Discussed benign etiology and prognosis. - Observe - Call for any changes  Return for Follow Up As Scheduled.  I, [name], CMA, am acting  as scribe for Sarina Ser, MD. Documentation: I have reviewed the above documentation for accuracy and completeness, and I agree with the above.  Sarina Ser, MD

## 2022-01-06 ENCOUNTER — Other Ambulatory Visit: Payer: Self-pay

## 2022-01-07 ENCOUNTER — Encounter: Payer: Self-pay | Admitting: Dermatology

## 2022-01-07 ENCOUNTER — Telehealth: Payer: Self-pay

## 2022-01-07 NOTE — Telephone Encounter (Signed)
Left pt msg to call for bx results/sh 

## 2022-01-07 NOTE — Telephone Encounter (Signed)
-----   Message from Ralene Bathe, MD sent at 01/07/2022 10:45 AM EDT ----- Diagnosis 1. 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 Skin , right preauricular area SUPERFICIAL AND NODULAR BASAL CELL CARCINOMA, ULCERATED  1,2,3 - all three Cancer = BCC All 3 already treated Recheck next visit

## 2022-01-13 ENCOUNTER — Other Ambulatory Visit: Payer: Self-pay

## 2022-01-13 ENCOUNTER — Telehealth: Payer: Self-pay

## 2022-01-13 MED ORDER — LISINOPRIL 10 MG PO TABS
ORAL_TABLET | Freq: Every day | ORAL | 3 refills | Status: DC
  Filled 2022-01-13: qty 30, 30d supply, fill #0
  Filled 2022-02-28: qty 30, 30d supply, fill #1
  Filled 2022-03-27: qty 30, 30d supply, fill #2
  Filled 2022-04-28: qty 30, 30d supply, fill #3

## 2022-01-13 NOTE — Telephone Encounter (Signed)
Advised patient of pathology results/hd 

## 2022-01-13 NOTE — Telephone Encounter (Signed)
-----   Message from Ralene Bathe, MD sent at 01/07/2022 10:45 AM EDT ----- Diagnosis 1. 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 Skin , right preauricular area SUPERFICIAL AND NODULAR BASAL CELL CARCINOMA, ULCERATED  1,2,3 - all three Cancer = BCC All 3 already treated Recheck next visit

## 2022-01-14 ENCOUNTER — Other Ambulatory Visit: Payer: Self-pay

## 2022-01-15 ENCOUNTER — Other Ambulatory Visit: Payer: Self-pay

## 2022-01-15 MED ORDER — COVID-19 MRNA 2023-2024 VACCINE (COMIRNATY) 0.3 ML INJECTION
INTRAMUSCULAR | 0 refills | Status: DC
  Filled 2022-01-16 – 2022-01-21 (×3): qty 0.3, 1d supply, fill #0

## 2022-01-16 ENCOUNTER — Other Ambulatory Visit: Payer: Self-pay

## 2022-01-17 ENCOUNTER — Other Ambulatory Visit: Payer: Self-pay

## 2022-01-21 ENCOUNTER — Other Ambulatory Visit: Payer: Self-pay

## 2022-01-22 ENCOUNTER — Other Ambulatory Visit: Payer: Self-pay

## 2022-01-27 ENCOUNTER — Encounter (INDEPENDENT_AMBULATORY_CARE_PROVIDER_SITE_OTHER): Payer: Self-pay

## 2022-01-28 ENCOUNTER — Other Ambulatory Visit: Payer: Self-pay

## 2022-02-05 ENCOUNTER — Other Ambulatory Visit: Payer: Self-pay

## 2022-02-07 ENCOUNTER — Other Ambulatory Visit (HOSPITAL_BASED_OUTPATIENT_CLINIC_OR_DEPARTMENT_OTHER): Payer: Self-pay

## 2022-02-24 ENCOUNTER — Ambulatory Visit: Payer: Medicare HMO | Admitting: Primary Care

## 2022-02-24 ENCOUNTER — Other Ambulatory Visit: Payer: Self-pay

## 2022-02-24 ENCOUNTER — Encounter: Payer: Self-pay | Admitting: Primary Care

## 2022-02-24 ENCOUNTER — Other Ambulatory Visit (INDEPENDENT_AMBULATORY_CARE_PROVIDER_SITE_OTHER): Payer: Self-pay | Admitting: Vascular Surgery

## 2022-02-24 VITALS — BP 132/84 | HR 82 | Temp 98.0°F | Ht 65.0 in | Wt 155.4 lb

## 2022-02-24 DIAGNOSIS — J189 Pneumonia, unspecified organism: Secondary | ICD-10-CM | POA: Diagnosis not present

## 2022-02-24 DIAGNOSIS — J309 Allergic rhinitis, unspecified: Secondary | ICD-10-CM | POA: Diagnosis not present

## 2022-02-24 DIAGNOSIS — R918 Other nonspecific abnormal finding of lung field: Secondary | ICD-10-CM

## 2022-02-24 DIAGNOSIS — J438 Other emphysema: Secondary | ICD-10-CM

## 2022-02-24 DIAGNOSIS — Z87891 Personal history of nicotine dependence: Secondary | ICD-10-CM

## 2022-02-24 DIAGNOSIS — K219 Gastro-esophageal reflux disease without esophagitis: Secondary | ICD-10-CM

## 2022-02-24 DIAGNOSIS — C159 Malignant neoplasm of esophagus, unspecified: Secondary | ICD-10-CM | POA: Diagnosis not present

## 2022-02-24 MED ORDER — AZELASTINE HCL 0.1 % NA SOLN
2.0000 | Freq: Two times a day (BID) | NASAL | 2 refills | Status: AC
  Filled 2022-02-24: qty 30, 30d supply, fill #0

## 2022-02-24 MED ORDER — SPIRIVA RESPIMAT 2.5 MCG/ACT IN AERS
2.0000 | INHALATION_SPRAY | Freq: Every day | RESPIRATORY_TRACT | 1 refills | Status: AC
  Filled 2022-02-24: qty 4, 30d supply, fill #0

## 2022-02-24 NOTE — Assessment & Plan Note (Addendum)
-   Patient has had dyspnea symptoms last several years, worse since having covid. He has a new dry cough 1-2 months. Spirometry in office showed mild restriction, no overt obstruction. Recommend trial spiriva respimat two puffs once daily. FU 4 weeks.

## 2022-02-24 NOTE — Assessment & Plan Note (Signed)
-   Following with Dr. Earlie Server with oncology

## 2022-02-24 NOTE — Assessment & Plan Note (Addendum)
-   Stable; CT chest/abdomen in October 2023 with oncology. Lungs showed mild paraseptal emphysema, numerous small solid nodules seen throughout right hemithorax not significantly changed when compared to previous exam.  Solid nodule right upper lobe measuring 1.3 x 0.8 cm unchanged.  Nodule right middle lobe measuring 9 mm also remains unchanged. - Continue to monitor clinically, if cough symptoms do not improve/worsen or has progressive changes on imaging he will need bronchoscopy with tissue sampling

## 2022-02-24 NOTE — Assessment & Plan Note (Signed)
-   Patient had covid pneumonia in feb 2021, admitted 2 days ICU in [place]

## 2022-02-24 NOTE — Progress Notes (Signed)
Reviewed and agree with assessment/plan.   Chesley Mires, MD [facility] Pulmonary/Critical Care 02/24/2022, 2:13 PM Pager:  [phone]

## 2022-02-24 NOTE — Patient Instructions (Addendum)
CT chest/abdomen in October 2023 showed mild emphysema, numerous small nodules throughout the right lung were not significantly changed when compared to previous exam.  These most likely reflect post factious or inflammatory etiologies such as COVID-pneumonia.  Spirometry showed mild restriction in breathing/ no evidence of COPD. I do recommend trial of a bronchodilator called Spiriva for your emphysema seen on CT imaging from previous smoking history   Start prescription nasal spray for rhinitis which could be triggering cough   Recommendations:  Trial Spiriva respimat- take two puff daily in the morning  Start nasal spray called Astelin- 2 sprays per nostril twice daily for rhinitis  Continue Prilosec (reflux medication) as prescribed   Follow-up: 4 weeks with Beth NP - reassess symptoms after stating new medication (can be virtual if able)

## 2022-02-24 NOTE — Assessment & Plan Note (Addendum)
-   Start Astelin nasal spray 2 sprays per nostril twice daily

## 2022-02-24 NOTE — Assessment & Plan Note (Addendum)
Continue Prilosec 40 mg daily.

## 2022-02-24 NOTE — Progress Notes (Signed)
$'@Patient'T$  ID: Blake [name], male    DOB: Aug 15, 1945, 76 y.o.   MRN: [id number]  Chief Complaint  Patient presents with   Follow-up    CT 12/2021-SOB with exertion and dry cough.    Referring provider: Idelle Crouch, MD  HPI: 76 year old male, former smoker. PMH significant for hypertension, A-fib, coronary artery disease, lung nodules, paraseptal emphysema, pneumonia, allergic rhinitis, esophageal cancer, GERD. Patient of Dr. Halford Chessman, last seen 08/01/2021  Previous [facility] pulmonary encounter: 08/01/21- Dr. Halford Chessman, Consult  He has history of head and neck, and esophageal cancer.  This was in setting of GERD with Barrett's esophagus.  He was noted to have changes in Rt lung on surveillance CT chest from April 2023.  Findings were concerning for atypical infection.  First noted to have changes like this on CT imaging from 01/17/20.  He was in hospital for aspiration pneumonia at this time.  Subsequent CT imaging showed improvement compared to scan from October 2021, and imaging from April 2022 is stable compared to 2023.     He is from [place].  Was never in the [employer]  He quit smoking years ago.  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  No history of exposure to tuberculosis.  He has a International aid/development worker.  No family history of lung disease.   He denies cough, wheeze, sputum, chest pain, fever, sweats, weight loss, or hemoptysis.  He gets short of breath sometimes if he exerts himself more than usual, but doesn't feel like his breathing normally limits his activity level.  He reports he sleeps on his right side.  No issues with his teeth.  Has occasional alcohol use.  Denies episodes of loss of consciousness.   Abnormal CT chest. - he will have follow up CT imaging through oncology later this year - will monitor clinically for now - if he develops symptoms suggestive of a respiratory infection or has progressive changes on CT imaging, then he will need bronchoscopy with airway  sampling   Esophageal adenocarcinoma stage 2b. - s/p esophagectomy with adjuvant chemotherapy - followed by Dr. Earlie Server with oncology   Paroxysmal atrial fibrillation, Coronary artery disease. - followed by Dr. Serafina Royals with [facility]   02/24/2022- Interim hx  Patient presents today for 6 month follow-up. He was last seen in May 2023 by Dr. Halford Chessman for consult d/t abnormal CT chest. He has hx esophageal adenocarcinoma, s/p esophagectomy with adjuvant chemotherapy.   He had CT chest/abdomen in October 2023 with oncology. Lungs showed mild paraseptal emphysema, numerous small solid nodules seen throughout right hemithorax not significantly changed when compared to previous exam.  Solid nodule right upper lobe measuring 1.3 x 0.8 cm unchanged.  Nodule right middle lobe measuring 9 mm also remains unchanged.  He developed new dry cough over the last 1-2 months. No sputum production. He does have dyspnea symptoms with moderate exertion, these have been ongoing for the past several years but worse since having covid pneumonia in feb 2021 requiring 2 day ICU stay in [facility].  02/24/2022 Spirometry >> FEV1 1.66 (69%), ratio 67 Mild restriction   Allergies  Allergen Reactions   Sulfa Antibiotics Rash    Immunization History  Administered Date(s) Administered   COVID-19, mRNA, vaccine(Comirnaty)12 years and older 01/22/2022   Influenza-Unspecified 01/25/2018, 01/20/2019, 01/14/2021, 12/17/2021   PFIZER Comirnaty(Gray Top)Covid-19 Tri-Sucrose Vaccine 04/21/2019, 05/12/2019   PFIZER(Purple Top)SARS-COV-2 Vaccination 04/21/2019, 05/12/2019, 11/29/2019   Pneumococcal Polysaccharide-23 02/02/2012   Tdap 01/31/2011  Zoster, Live 01/25/2010    Past Medical History:  Diagnosis Date   Arthritis    Back pain    with leg pain   Basal cell carcinoma 09/04/2009   R cheek 5.5 cm ant to earlobe - 12/26/2009 excision   Basal cell carcinoma 09/04/2009   R ant nasal alar rim    Basal cell carcinoma 07/31/2014   R mid brow   Basal cell carcinoma 07/30/2016   R ant nasal alar rim at ant edge of BCC scar   Basal cell carcinoma 11/02/2019   L zygoma    Basal cell carcinoma 07/22/2021   left sideburn area, EDC   Basal cell carcinoma 01/02/2022   Right posterior shoulder, EDC   Basal cell carcinoma 01/02/2022   R posterior shoulder, EDC   Basal cell carcinoma 01/02/2022   Right Supraclavicular Base of Neck, EDC   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   s/p chemoradiation   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, bilateral    Dyspnea    Dysrhythmia    Esophageal cancer ([place]) 2021   GERD (gastroesophageal reflux disease)    Hyperlipidemia    Hypertension    Numbness and tingling of both lower extremities    with positioning   Paroxysmal atrial fibrillation ([place])    Port-A-Cath in place 2006   Prostate enlargement    Squamous cell carcinoma of skin 12/14/2019   L pretibial - ED&C    T2DM (type 2 diabetes mellitus) ([place])     Tobacco History: Social History   Tobacco Use  Smoking Status Former   Packs/day: 1.00   Years: 30.00   Total pack years: 30.00   Types: Cigarettes   Quit date: 02/18/1994   Years since quitting: 28.0  Smokeless Tobacco Never   Counseling given: Not Answered   Outpatient Medications Prior to Visit  Medication Sig Dispense Refill   aspirin 81 MG EC tablet Take 1 tablet (81 mg total) by mouth daily at 6 (six) AM. Swallow whole. 30 tablet 11   clopidogrel (PLAVIX) 75 MG tablet Take 1 tablet (75 mg total) by mouth daily. 30 tablet 6   COVID-19 mRNA vaccine 2023-2024 (COMIRNATY) SUSP injection Inject into the muscle. 0.3 mL 0   lisinopril (ZESTRIL) 10 MG tablet TAKE 1 TABLET (10 MG TOTAL) BY MOUTH DAILY. 30 tablet 3   methocarbamol (ROBAXIN) 500 MG tablet Take 1 and 1/2 tablets (750 mg total) by mouth every 6 (six) hours as needed for muscle spasms. 90 tablet 1   metoprolol succinate (TOPROL-XL) 100 MG 24 hr tablet  Take 2 tablets (200 mg total) by mouth once daily 180 tablet 1   mirtazapine (REMERON) 15 MG tablet Take 1 tablet (15 mg total) by mouth nightly 30 tablet 11   mupirocin ointment (BACTROBAN) 2 % Apply 1 Application topically daily. Qd to excision site 22 g 1   omeprazole (PRILOSEC) 40 MG capsule Take 1 capsule (40 mg total) by mouth once daily 90 capsule 3   tamsulosin (FLOMAX) 0.4 MG CAPS capsule TAKE 1 CAPSULE BY MOUTH ONCE DAILY. TAKE 30 MINUTES AFTER SAME MEAL EACH DAY. 90 capsule 3   atorvastatin (LIPITOR) 80 MG tablet TAKE 1 TABLET BY MOUTH ONCE DAILY 90 tablet 3   omeprazole (PRILOSEC) 40 MG capsule Take 40 mg by mouth daily.  3   No facility-administered medications prior to visit.    Review of Systems  Review of Systems  Constitutional: Negative.   HENT:  Positive for postnasal drip.  Respiratory:  Positive for shortness of breath. Negative for cough, chest tightness and wheezing.   Cardiovascular: Negative.    Physical Exam  BP 132/84 (BP Location: Left Arm, Cuff Size: Large)   Pulse 82   Temp 98 F (36.7 C) (Temporal)   Ht '5\' 5"'$  (1.651 m)   Wt 155 lb 6.4 oz (70.5 kg)   SpO2 94%   BMI 25.86 kg/m  Physical Exam Constitutional:      General: He is not in acute distress.    Appearance: Normal appearance. He is not ill-appearing.  HENT:     Head: Normocephalic and atraumatic.     Mouth/Throat:     Mouth: Mucous membranes are moist.     Pharynx: Oropharynx is clear.  Cardiovascular:     Rate and Rhythm: Normal rate and regular rhythm.  Pulmonary:     Effort: Pulmonary effort is normal. No respiratory distress.     Breath sounds: No wheezing, rhonchi or rales.  Musculoskeletal:        General: Normal range of motion.     Cervical back: Normal range of motion and neck supple.  Skin:    General: Skin is warm and dry.  Neurological:     General: No focal deficit present.     Mental Status: He is alert and oriented to person, place, and time. Mental status is at  baseline.  Psychiatric:        Mood and Affect: Mood normal.        Behavior: Behavior normal.        Thought Content: Thought content normal.        Judgment: Judgment normal.      Lab Results:  CBC    Component Value Date/Time   WBC 8.9 12/30/2021 0833   RBC 5.14 12/30/2021 0833   HGB 12.4 (L) 12/30/2021 0833   HGB 11.8 (L) 01/26/2013 0505   HCT 40.9 12/30/2021 0833   HCT 47.5 01/12/2013 1457   PLT 319 12/30/2021 0833   PLT 220 01/26/2013 0505   MCV 79.6 (L) 12/30/2021 0833   MCV 90 01/12/2013 1457   MCH 24.1 (L) 12/30/2021 0833   MCHC 30.3 12/30/2021 0833   RDW 15.9 (H) 12/30/2021 0833   RDW 13.7 01/12/2013 1457   LYMPHSABS 1.5 12/30/2021 0833   LYMPHSABS 0.9 (L) 08/11/2011 1440   MONOABS 0.8 12/30/2021 0833   MONOABS 0.4 08/11/2011 1440   EOSABS 0.2 12/30/2021 0833   EOSABS 0.0 08/11/2011 1440   BASOSABS 0.1 12/30/2021 0833   BASOSABS 0.0 08/11/2011 1440    BMET    Component Value Date/Time   NA 139 12/30/2021 0833   NA 130 (L) 01/26/2013 0505   K 4.2 12/30/2021 0833   K 4.6 01/26/2013 0505   CL 108 12/30/2021 0833   CL 101 01/26/2013 0505   CO2 24 12/30/2021 0833   CO2 26 01/26/2013 0505   GLUCOSE 173 (H) 12/30/2021 0833   GLUCOSE 138 (H) 01/26/2013 0505   BUN 19 12/30/2021 0833   BUN 19 (H) 01/26/2013 0505   CREATININE 1.01 12/30/2021 0833   CREATININE 1.18 01/26/2013 0505   CALCIUM 8.4 (L) 12/30/2021 0833   CALCIUM 8.3 (L) 01/26/2013 0505   GFRNONAA >60 12/30/2021 0833   GFRNONAA >60 01/26/2013 0505   GFRAA >60 12/28/2019 1328   GFRAA >60 01/26/2013 0505    BNP    Component Value Date/Time   BNP 989.1 (H) 06/26/2020 0556    ProBNP No results found for: "PROBNP"  Imaging: No results found.   Assessment & Plan:   Lung nodules - Stable; CT chest/abdomen in October 2023 with oncology. Lungs showed mild paraseptal emphysema, numerous small solid nodules seen throughout right hemithorax not significantly changed when compared to previous  exam.  Solid nodule right upper lobe measuring 1.3 x 0.8 cm unchanged.  Nodule right middle lobe measuring 9 mm also remains unchanged. - Continue to monitor clinically, if cough symptoms do not improve/worsen or has progressive changes on imaging he will need bronchoscopy with tissue sampling   Allergic rhinitis - Start Astelin nasal spray 2 sprays per nostril twice daily   Pneumonia - Patient had covid pneumonia in feb 2021, admitted 2 days ICU in [place]   Paraseptal emphysema [facility]) - Patient has had dyspnea symptoms last several years, worse since having covid. He has a new dry cough 1-2 months. Spirometry in office showed mild restriction, no overt obstruction. Recommend trial spiriva respimat two puffs once daily. FU 4 weeks.   Acid reflux - Continue Prilosec '40mg'$  daily   Esophageal carcinoma ([place]) - Following with Dr. Earlie Server with oncology   Martyn Ehrich, NP 02/24/2022

## 2022-02-25 ENCOUNTER — Other Ambulatory Visit: Payer: Self-pay

## 2022-02-25 MED ORDER — CLOPIDOGREL BISULFATE 75 MG PO TABS
75.0000 mg | ORAL_TABLET | Freq: Every day | ORAL | 6 refills | Status: DC
  Filled 2022-02-25: qty 30, 30d supply, fill #0
  Filled 2022-03-27: qty 30, 30d supply, fill #1
  Filled 2022-04-28: qty 30, 30d supply, fill #2
  Filled 2022-06-02: qty 30, 30d supply, fill #3
  Filled 2022-07-09: qty 30, 30d supply, fill #4
  Filled 2022-07-31: qty 30, 30d supply, fill #5
  Filled 2022-09-13: qty 30, 30d supply, fill #6

## 2022-02-28 ENCOUNTER — Other Ambulatory Visit: Payer: Self-pay

## 2022-03-10 ENCOUNTER — Other Ambulatory Visit: Payer: Self-pay

## 2022-03-11 ENCOUNTER — Other Ambulatory Visit: Payer: Self-pay

## 2022-03-11 MED ORDER — RSVPREF3 VAC RECOMB ADJUVANTED 120 MCG/0.5ML IM SUSR
INTRAMUSCULAR | 0 refills | Status: DC
  Filled 2022-03-11: qty 1, 1d supply, fill #0

## 2022-03-26 NOTE — Progress Notes (Unsigned)
Virtual Visit via Video Note  I connected with Blake [name] on 03/26/22 at 10:30 AM EST by a video enabled telemedicine application and verified that I am speaking with the correct person using two identifiers.  Location: Patient: Home Provider: Office    I discussed the limitations of evaluation and management by telemedicine and the availability of in person appointments. The patient expressed understanding and agreed to proceed.  History of Present Illness: 76 year old male, former smoker. PMH significant for hypertension, A-fib, coronary artery disease, lung nodules, paraseptal emphysema, pneumonia, allergic rhinitis, esophageal cancer, GERD. Patient of Dr. Halford Chessman, last seen 08/01/2021  Previous [facility] pulmonary encounter: 08/01/21- Dr. Halford Chessman, Consult  He has history of head and neck, and esophageal cancer.  This was in setting of GERD with Barrett's esophagus.  He was noted to have changes in Rt lung on surveillance CT chest from April 2023.  Findings were concerning for atypical infection.  First noted to have changes like this on CT imaging from 01/17/20.  He was in hospital for aspiration pneumonia at this time.  Subsequent CT imaging showed improvement compared to scan from October 2021, and imaging from April 2022 is stable compared to 2023.     He is from [place].  Was never in the [employer].  He quit smoking years ago.  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  No history of exposure to tuberculosis.  He has a International aid/development worker.  No family history of lung disease.   He denies cough, wheeze, sputum, chest pain, fever, sweats, weight loss, or hemoptysis.  He gets short of breath sometimes if he exerts himself more than usual, but doesn't feel like his breathing normally limits his activity level.  He reports he sleeps on his right side.  No issues with his teeth.  Has occasional alcohol use.  Denies episodes of loss of consciousness.   Abnormal CT chest. - he will have follow up CT  imaging through oncology later this year - will monitor clinically for now - if he develops symptoms suggestive of a respiratory infection or has progressive changes on CT imaging, then he will need bronchoscopy with airway sampling   Esophageal adenocarcinoma stage 2b. - s/p esophagectomy with adjuvant chemotherapy - followed by Dr. Earlie Server with oncology   Paroxysmal atrial fibrillation, Coronary artery disease. - followed by Dr. Serafina Royals with [facility]   02/24/2022 Patient presents today for 6 month follow-up. He was last seen in May 2023 by Dr. Halford Chessman for consult d/t abnormal CT chest. He has hx esophageal adenocarcinoma, s/p esophagectomy with adjuvant chemotherapy.   He had CT chest/abdomen in October 2023 with oncology. Lungs showed mild paraseptal emphysema, numerous small solid nodules seen throughout right hemithorax not significantly changed when compared to previous exam.  Solid nodule right upper lobe measuring 1.3 x 0.8 cm unchanged.  Nodule right middle lobe measuring 9 mm also remains unchanged.  He developed new dry cough over the last 1-2 months. No sputum production. He does have dyspnea symptoms with moderate exertion, these have been ongoing for the past several years but worse since having covid pneumonia in feb 2021 requiring 2 day ICU stay in [facility].   Lung nodules - Stable; CT chest/abdomen in October 2023 with oncology. Lungs showed mild paraseptal emphysema, numerous small solid nodules seen throughout right hemithorax not significantly changed when compared to previous exam.  Solid nodule right upper lobe measuring 1.3 x 0.8 cm unchanged.  Nodule right middle lobe  measuring 9 mm also remains unchanged. - Continue to monitor clinically, if cough symptoms do not improve/worsen or has progressive changes on imaging he will need bronchoscopy with tissue sampling    Allergic rhinitis - Start Astelin nasal spray 2 sprays per nostril twice daily     Pneumonia - Patient had covid pneumonia in feb 2021, admitted 2 days ICU in [place]    Paraseptal emphysema [facility]) - Patient has had dyspnea symptoms last several years, worse since having covid. He has a new dry cough 1-2 months. Spirometry in office showed mild restriction, no overt obstruction. Recommend trial spiriva respimat two puffs once daily. FU 4 weeks.    Acid reflux - Continue Prilosec '40mg'$  daily    Esophageal carcinoma ([place]) - Following with Dr. Earlie Server with oncology    03/27/2022 - Interim hx Patient contacted today for 4 week virtual follow-up. Hx pulmonary nodules, emphysema and Covid PNA. He has mild emphysema on CT imaging, stable small solid pulmonary nodules. He has had a cough for 1-2 months. At last OV we recommended he start Spiriva respimat 2 puffs once daily and Astelin nasal spray for allergic rhinitis.   He is doing very well. Cough is all but gone. Feels PND was contributing to cough, especially at night. He has been using Astelin nasal spray and directed and Spiriva respimat. No significant shortness of breath,.     Observations/Objective:  - Appears well; No overt shortness of breath, wheezing or cough  02/24/2022 Spirometry >> FEV1 1.66 (69%), ratio 67 Mild restriction   Assessment and Plan:  Cough: - Resolved; Cough most likely related to allergic rhinitis/PND. Cough improved with Astelin nasal spray  Mild Emphysema: - Recommend trial off Spiriva, instructed patient if cough or shortness of breath worsen off LAMA to resume use  Allergic rhinitis:  - Continue Astelin nasal spray daily; Advised he can taper to as needed use in the future but instructed to resume if cough worsen off medication   GERD: - Continue Prilosec '40mg'$  daily    Follow Up Instructions:   - 6 months with Dr. Halford Chessman  I discussed the assessment and treatment plan with the patient. The patient was provided an opportunity to ask questions and all were answered. The  patient agreed with the plan and demonstrated an understanding of the instructions.   The patient was advised to call back or seek an in-person evaluation if the symptoms worsen or if the condition fails to improve as anticipated.  I provided 22 minutes of non-face-to-face time during this encounter.   Martyn Ehrich, NP

## 2022-03-27 ENCOUNTER — Encounter: Payer: Self-pay | Admitting: Primary Care

## 2022-03-27 ENCOUNTER — Telehealth (INDEPENDENT_AMBULATORY_CARE_PROVIDER_SITE_OTHER): Payer: Medicare HMO | Admitting: Primary Care

## 2022-03-27 VITALS — Ht 65.0 in | Wt 150.0 lb

## 2022-03-27 DIAGNOSIS — J438 Other emphysema: Secondary | ICD-10-CM | POA: Diagnosis not present

## 2022-03-27 DIAGNOSIS — R052 Subacute cough: Secondary | ICD-10-CM

## 2022-03-27 NOTE — Progress Notes (Signed)
Reviewed and agree with assessment/plan.   Chesley Mires, MD [facility] Pulmonary/Critical Care 03/27/2022, 12:37 PM Pager:  [phone]

## 2022-03-31 ENCOUNTER — Encounter: Payer: Self-pay | Admitting: Oncology

## 2022-04-01 NOTE — Telephone Encounter (Signed)
Last port flush was on 10/2. Please schedule port flush, this week and then every 8 weeks x6. Pt prefers appt in the afternoon. Please contact him with appts.

## 2022-04-02 ENCOUNTER — Inpatient Hospital Stay: Payer: Medicare HMO | Attending: Oncology

## 2022-04-02 DIAGNOSIS — Z452 Encounter for adjustment and management of vascular access device: Secondary | ICD-10-CM | POA: Diagnosis not present

## 2022-04-02 DIAGNOSIS — Z95828 Presence of other vascular implants and grafts: Secondary | ICD-10-CM

## 2022-04-02 DIAGNOSIS — Z8501 Personal history of malignant neoplasm of esophagus: Secondary | ICD-10-CM | POA: Insufficient documentation

## 2022-04-02 MED ORDER — HEPARIN SOD (PORK) LOCK FLUSH 100 UNIT/ML IV SOLN
500.0000 [IU] | Freq: Once | INTRAVENOUS | Status: AC
  Administered 2022-04-02: 500 [IU] via INTRAVENOUS
  Filled 2022-04-02: qty 5

## 2022-04-02 MED ORDER — SODIUM CHLORIDE 0.9% FLUSH
10.0000 mL | Freq: Once | INTRAVENOUS | Status: AC
  Administered 2022-04-02: 10 mL via INTRAVENOUS
  Filled 2022-04-02: qty 10

## 2022-04-22 ENCOUNTER — Other Ambulatory Visit: Payer: Self-pay

## 2022-04-22 DIAGNOSIS — I10 Essential (primary) hypertension: Secondary | ICD-10-CM | POA: Diagnosis not present

## 2022-04-22 DIAGNOSIS — Z79899 Other long term (current) drug therapy: Secondary | ICD-10-CM | POA: Diagnosis not present

## 2022-04-22 DIAGNOSIS — E118 Type 2 diabetes mellitus with unspecified complications: Secondary | ICD-10-CM | POA: Diagnosis not present

## 2022-04-22 DIAGNOSIS — I480 Paroxysmal atrial fibrillation: Secondary | ICD-10-CM | POA: Diagnosis not present

## 2022-04-22 DIAGNOSIS — E782 Mixed hyperlipidemia: Secondary | ICD-10-CM | POA: Diagnosis not present

## 2022-04-22 DIAGNOSIS — R194 Change in bowel habit: Secondary | ICD-10-CM | POA: Diagnosis not present

## 2022-04-22 MED ORDER — ERYTHROMYCIN 5 MG/GM OP OINT
1.0000 | TOPICAL_OINTMENT | Freq: Three times a day (TID) | OPHTHALMIC | 0 refills | Status: AC
  Filled 2022-04-22: qty 3.5, 30d supply, fill #0

## 2022-04-22 MED ORDER — IBUPROFEN 800 MG PO TABS
800.0000 mg | ORAL_TABLET | Freq: Three times a day (TID) | ORAL | 1 refills | Status: DC | PRN
  Filled 2022-04-22: qty 60, 20d supply, fill #0

## 2022-04-22 MED ORDER — METHOCARBAMOL 500 MG PO TABS
500.0000 mg | ORAL_TABLET | Freq: Four times a day (QID) | ORAL | 1 refills | Status: DC | PRN
  Filled 2022-04-22: qty 90, 23d supply, fill #0

## 2022-04-22 MED ORDER — DILTIAZEM HCL ER COATED BEADS 180 MG PO CP24
180.0000 mg | ORAL_CAPSULE | Freq: Every day | ORAL | 3 refills | Status: DC
  Filled 2022-04-22: qty 90, 90d supply, fill #0

## 2022-04-23 ENCOUNTER — Telehealth: Payer: Self-pay | Admitting: *Deleted

## 2022-04-23 ENCOUNTER — Other Ambulatory Visit: Payer: Self-pay

## 2022-04-23 MED ORDER — ROSUVASTATIN CALCIUM 10 MG PO TABS
10.0000 mg | ORAL_TABLET | Freq: Every day | ORAL | 3 refills | Status: DC
  Filled 2022-04-23: qty 90, 90d supply, fill #0
  Filled 2022-07-22: qty 90, 90d supply, fill #1
  Filled 2022-11-07: qty 90, 90d supply, fill #2
  Filled 2023-02-23: qty 90, 90d supply, fill #3

## 2022-04-23 NOTE — Patient Outreach (Signed)
  Care Coordination   Initial Visit Note   04/23/2022 Name: Blake [name] MRN: [id number] DOB: 18-Aug-1945  Blake [name] is a 77 y.o. year old male who sees Sparks, Leonie Douglas, MD for primary care. I spoke with  Blake [name] by phone today.  What matters to the patients health and wellness today?  State he does not have time for assessment, request call back at a later date.    SDOH assessments and interventions completed:  No     Care Coordination Interventions:  No, not indicated   Follow up plan:  Within the next 3-5 business days.    Encounter Outcome:  Pt. Visit Completed   Valente David, RN, MSN, [place] Care Management Care Management Coordinator [phone]

## 2022-04-25 ENCOUNTER — Emergency Department: Payer: Medicare HMO

## 2022-04-25 ENCOUNTER — Other Ambulatory Visit: Payer: Self-pay

## 2022-04-25 ENCOUNTER — Encounter: Payer: Self-pay | Admitting: Radiology

## 2022-04-25 ENCOUNTER — Observation Stay
Admission: EM | Admit: 2022-04-25 | Discharge: 2022-04-26 | Disposition: A | Discharge status: Home / Self Care | Payer: Medicare HMO | Attending: Internal Medicine | Admitting: Internal Medicine

## 2022-04-25 DIAGNOSIS — I619 Nontraumatic intracerebral hemorrhage, unspecified: Secondary | ICD-10-CM | POA: Diagnosis not present

## 2022-04-25 DIAGNOSIS — R9089 Other abnormal findings on diagnostic imaging of central nervous system: Secondary | ICD-10-CM | POA: Insufficient documentation

## 2022-04-25 DIAGNOSIS — E119 Type 2 diabetes mellitus without complications: Secondary | ICD-10-CM | POA: Insufficient documentation

## 2022-04-25 DIAGNOSIS — Z85828 Personal history of other malignant neoplasm of skin: Secondary | ICD-10-CM | POA: Diagnosis not present

## 2022-04-25 DIAGNOSIS — R001 Bradycardia, unspecified: Secondary | ICD-10-CM

## 2022-04-25 DIAGNOSIS — G9389 Other specified disorders of brain: Secondary | ICD-10-CM

## 2022-04-25 DIAGNOSIS — S2231XA Fracture of one rib, right side, initial encounter for closed fracture: Secondary | ICD-10-CM | POA: Diagnosis not present

## 2022-04-25 DIAGNOSIS — Z7902 Long term (current) use of antithrombotics/antiplatelets: Secondary | ICD-10-CM | POA: Diagnosis not present

## 2022-04-25 DIAGNOSIS — I4891 Unspecified atrial fibrillation: Secondary | ICD-10-CM | POA: Diagnosis not present

## 2022-04-25 DIAGNOSIS — D649 Anemia, unspecified: Secondary | ICD-10-CM | POA: Diagnosis not present

## 2022-04-25 DIAGNOSIS — Z7982 Long term (current) use of aspirin: Secondary | ICD-10-CM | POA: Insufficient documentation

## 2022-04-25 DIAGNOSIS — Z87891 Personal history of nicotine dependence: Secondary | ICD-10-CM | POA: Diagnosis not present

## 2022-04-25 DIAGNOSIS — Z96651 Presence of right artificial knee joint: Secondary | ICD-10-CM | POA: Diagnosis not present

## 2022-04-25 DIAGNOSIS — R791 Abnormal coagulation profile: Secondary | ICD-10-CM | POA: Insufficient documentation

## 2022-04-25 DIAGNOSIS — Z8501 Personal history of malignant neoplasm of esophagus: Secondary | ICD-10-CM | POA: Insufficient documentation

## 2022-04-25 DIAGNOSIS — Z8673 Personal history of transient ischemic attack (TIA), and cerebral infarction without residual deficits: Secondary | ICD-10-CM | POA: Insufficient documentation

## 2022-04-25 DIAGNOSIS — R0689 Other abnormalities of breathing: Secondary | ICD-10-CM | POA: Diagnosis not present

## 2022-04-25 DIAGNOSIS — R202 Paresthesia of skin: Secondary | ICD-10-CM | POA: Diagnosis not present

## 2022-04-25 DIAGNOSIS — E872 Acidosis, unspecified: Secondary | ICD-10-CM

## 2022-04-25 DIAGNOSIS — R7989 Other specified abnormal findings of blood chemistry: Secondary | ICD-10-CM | POA: Insufficient documentation

## 2022-04-25 DIAGNOSIS — R918 Other nonspecific abnormal finding of lung field: Secondary | ICD-10-CM | POA: Diagnosis present

## 2022-04-25 DIAGNOSIS — R55 Syncope and collapse: Principal | ICD-10-CM | POA: Insufficient documentation

## 2022-04-25 DIAGNOSIS — I2510 Atherosclerotic heart disease of native coronary artery without angina pectoris: Secondary | ICD-10-CM | POA: Insufficient documentation

## 2022-04-25 DIAGNOSIS — R4020 Unspecified coma: Secondary | ICD-10-CM

## 2022-04-25 DIAGNOSIS — I6381 Other cerebral infarction due to occlusion or stenosis of small artery: Secondary | ICD-10-CM | POA: Diagnosis not present

## 2022-04-25 DIAGNOSIS — R569 Unspecified convulsions: Secondary | ICD-10-CM | POA: Diagnosis not present

## 2022-04-25 DIAGNOSIS — I10 Essential (primary) hypertension: Secondary | ICD-10-CM | POA: Diagnosis not present

## 2022-04-25 DIAGNOSIS — I6782 Cerebral ischemia: Secondary | ICD-10-CM | POA: Diagnosis not present

## 2022-04-25 LAB — BASIC METABOLIC PANEL
Anion gap: 13 (ref 5–15)
BUN: 18 mg/dL (ref 8–23)
CO2: 18 mmol/L — ABNORMAL LOW (ref 22–32)
Calcium: 8.1 mg/dL — ABNORMAL LOW (ref 8.9–10.3)
Chloride: 107 mmol/L (ref 98–111)
Creatinine, Ser: 1.24 mg/dL (ref 0.61–1.24)
GFR, Estimated: >60 mL/min (ref >60)
Glucose, Bld: 152 mg/dL — ABNORMAL HIGH (ref 70–99)
Potassium: 4.1 mmol/L (ref 3.5–5.1)
Sodium: 138 mmol/L (ref 135–145)

## 2022-04-25 LAB — CBC
HCT: 41.4 % (ref 39.0–52.0)
Hemoglobin: 11.8 g/dL — ABNORMAL LOW (ref 13.0–17.0)
MCH: 23.4 pg — ABNORMAL LOW (ref 26.0–34.0)
MCHC: 28.5 g/dL — ABNORMAL LOW (ref 30.0–36.0)
MCV: 82.1 fL (ref 80.0–100.0)
Platelets: 252 10*3/uL (ref 150–400)
RBC: 5.04 MIL/uL (ref 4.22–5.81)
RDW: 16.4 % — ABNORMAL HIGH (ref 11.5–15.5)
WBC: 9.9 10*3/uL (ref 4.0–10.5)
nRBC: 0 % (ref 0.0–0.2)

## 2022-04-25 LAB — PROTIME-INR
INR: 1 (ref 0.8–1.2)
Prothrombin Time: 13.4 seconds (ref 11.4–15.2)

## 2022-04-25 LAB — APTT: aPTT: 27 seconds (ref 24–36)

## 2022-04-25 LAB — D-DIMER, QUANTITATIVE: D-Dimer, Quant: 0.77 ug/mL-FEU — ABNORMAL HIGH (ref 0.00–0.50)

## 2022-04-25 LAB — TROPONIN I (HIGH SENSITIVITY): Troponin I (High Sensitivity): 8 ng/L (ref <18)

## 2022-04-25 MED ORDER — LISINOPRIL 10 MG PO TABS
10.0000 mg | ORAL_TABLET | Freq: Every day | ORAL | Status: DC
  Administered 2022-04-26: 10 mg via ORAL
  Filled 2022-04-25: qty 1

## 2022-04-25 MED ORDER — TAMSULOSIN HCL 0.4 MG PO CAPS
0.4000 mg | ORAL_CAPSULE | Freq: Every day | ORAL | Status: DC
  Administered 2022-04-26: 0.4 mg via ORAL
  Filled 2022-04-25: qty 1

## 2022-04-25 MED ORDER — ROSUVASTATIN CALCIUM 10 MG PO TABS
10.0000 mg | ORAL_TABLET | Freq: Every day | ORAL | Status: DC
  Administered 2022-04-26: 10 mg via ORAL
  Filled 2022-04-25: qty 1

## 2022-04-25 MED ORDER — AZELASTINE HCL 0.1 % NA SOLN: 2.0000 | Freq: Two times a day (BID) | NASAL | Status: DC | PRN

## 2022-04-25 MED ORDER — TIOTROPIUM BROMIDE MONOHYDRATE 18 MCG IN CAPS
1.0000 | ORAL_CAPSULE | Freq: Every day | RESPIRATORY_TRACT | Status: DC
  Filled 2022-04-25: qty 5

## 2022-04-25 MED ORDER — MIRTAZAPINE 15 MG PO TABS
15.0000 mg | ORAL_TABLET | Freq: Every day | ORAL | Status: DC
  Administered 2022-04-26: 15 mg via ORAL
  Filled 2022-04-25: qty 1

## 2022-04-25 MED ORDER — SODIUM CHLORIDE 0.9 % IV BOLUS
1000.0000 mL | Freq: Once | INTRAVENOUS | Status: AC
  Administered 2022-04-25: 1000 mL via INTRAVENOUS

## 2022-04-25 MED ORDER — ERYTHROMYCIN 5 MG/GM OP OINT
1.0000 | TOPICAL_OINTMENT | Freq: Three times a day (TID) | OPHTHALMIC | Status: DC
  Administered 2022-04-26: 1 via OPHTHALMIC

## 2022-04-25 MED ORDER — SODIUM BICARBONATE 650 MG PO TABS
650.0000 mg | ORAL_TABLET | Freq: Three times a day (TID) | ORAL | Status: DC
  Administered 2022-04-26 (×3): 650 mg via ORAL
  Filled 2022-04-25 (×5): qty 1

## 2022-04-25 MED ORDER — INSULIN ASPART 100 UNIT/ML IJ SOLN
0.0000 [IU] | Freq: Three times a day (TID) | INTRAMUSCULAR | Status: DC
  Filled 2022-04-25: qty 1

## 2022-04-25 MED ORDER — ASPIRIN 81 MG PO TBEC
81.0000 mg | DELAYED_RELEASE_TABLET | Freq: Every day | ORAL | Status: DC
  Filled 2022-04-25: qty 1

## 2022-04-25 MED ORDER — CLOPIDOGREL BISULFATE 75 MG PO TABS
75.0000 mg | ORAL_TABLET | Freq: Every day | ORAL | Status: DC
  Administered 2022-04-26: 75 mg via ORAL
  Filled 2022-04-25: qty 1

## 2022-04-25 MED ORDER — ENOXAPARIN SODIUM 40 MG/0.4ML IJ SOSY
40.0000 mg | PREFILLED_SYRINGE | INTRAMUSCULAR | Status: DC
  Administered 2022-04-26: 40 mg via SUBCUTANEOUS
  Filled 2022-04-25: qty 0.4

## 2022-04-25 MED ORDER — PANTOPRAZOLE SODIUM 40 MG PO TBEC
40.0000 mg | DELAYED_RELEASE_TABLET | Freq: Every day | ORAL | Status: DC
  Administered 2022-04-26: 40 mg via ORAL
  Filled 2022-04-25: qty 1

## 2022-04-25 MED ORDER — METOPROLOL SUCCINATE ER 50 MG PO TB24
200.0000 mg | ORAL_TABLET | Freq: Every day | ORAL | Status: DC
  Administered 2022-04-26: 200 mg via ORAL
  Filled 2022-04-25: qty 4

## 2022-04-25 MED ORDER — INSULIN ASPART 100 UNIT/ML IJ SOLN: 0.0000 [IU] | Freq: Every day | INTRAMUSCULAR | Status: DC

## 2022-04-25 MED ORDER — SODIUM CHLORIDE 0.9% FLUSH
3.0000 mL | Freq: Two times a day (BID) | INTRAVENOUS | Status: DC
  Administered 2022-04-26 (×2): 3 mL via INTRAVENOUS

## 2022-04-25 NOTE — ED Notes (Signed)
Pt does report drinking a few beers tonight. CBG 213 for EMS

## 2022-04-25 NOTE — H&P (Incomplete)
History and Physical    Patient: Blake [name] [id number]:[id number] DOB: 1945/06/09 DOA: 04/25/2022 DOS: the patient was seen and examined on 04/25/2022 PCP: Idelle Crouch, MD  Patient coming from: Home  Chief Complaint:  Chief Complaint  Patient presents with  . Loss of Consciousness   HPI: Blake [name] is a 77 y.o. male with medical history significant of right carotid artery stenting and starting of dilitazem recently.  Patient was in his usual state of health till approximately 8 PM this evening when he was at a get together and had a couple of drinks of beer already.  At the time patient reports that he had an exacerbation of his chronic low back pain that he often gets while prolonged standing.  Therefore patient was able to help himself to a chair and sat down.  And immediately after patient reports feeling "unwell "patient is not able to further clarify this.  Patient does not report any focal weakness or focal sensory symptoms no palpitations no chest pain no trouble breathing.  Patient subsequently lost consciousness.  Unfortunately I do not have any of the witnesses of the event directly to interview.  Patient reports waking up in the EMS ambulance. Recalls being sweaty. And he had wet himself.  Patient has since then been pretty much asymptomatic although he is still feeling weak.  There is no again for any chest pain shortness of breath focal weakness palpitation no more presyncope.  Medical evaluation is sought   Review of Systems: As mentioned in the history of present illness. All other systems reviewed and are negative. Past Medical History:  Diagnosis Date  . Arthritis   . Back pain    with leg pain  . Basal cell carcinoma 09/04/2009   R cheek 5.5 cm ant to earlobe - 12/26/2009 excision  . Basal cell carcinoma 09/04/2009   R ant nasal alar rim  . Basal cell carcinoma 07/31/2014   R mid brow  . Basal cell carcinoma 07/30/2016   R ant nasal alar rim at ant edge of  BCC scar  . Basal cell carcinoma 11/02/2019   L zygoma   . Basal cell carcinoma 07/22/2021   left sideburn area, EDC  . Basal cell carcinoma 01/02/2022   Right posterior shoulder, EDC  . Basal cell carcinoma 01/02/2022   R posterior shoulder, EDC  . Basal cell carcinoma 01/02/2022   Right Supraclavicular Base of Neck, EDC  . 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 Dyspnea   . Dysrhythmia   . Esophageal cancer ([place]) 2021  . GERD (gastroesophageal reflux disease)   . Hyperlipidemia   . Hypertension   . Numbness and tingling of both lower extremities    with positioning  . Paroxysmal atrial fibrillation ([facility])   . Port-A-Cath in place 2006  . Prostate enlargement   . Squamous cell carcinoma of skin 12/14/2019   L pretibial - ED&C   . T2DM (type 2 diabetes mellitus) ([place])    Past Surgical History:  Procedure Laterality Date  . ABDOMINAL SURGERY    . CAROTID PTA/STENT INTERVENTION Right 07/04/2021   Procedure: CAROTID PTA/STENT INTERVENTION;  Surgeon: Algernon Huxley, MD;  Location: [facility];  Service: Cardiovascular;  Laterality: Right;  . COLONOSCOPY  12/08/2003  . COLONOSCOPY WITH PROPOFOL N/A 03/21/2019   Procedure: COLONOSCOPY WITH PROPOFOL;  Surgeon: Toledo, Benay Pike, MD;  Location: [facility];  Service: Gastroenterology;  Laterality: N/A;  . ESOPHAGOGASTRODUODENOSCOPY (EGD) WITH PROPOFOL N/A 03/21/2019   Procedure: ESOPHAGOGASTRODUODENOSCOPY (EGD) WITH PROPOFOL;  Surgeon: Toledo, Benay Pike, MD;  Location: [facility];  Service: Gastroenterology;  Laterality: N/A;  . esophogeal cancer    . JOINT REPLACEMENT Right    total hip  . LUMBAR LAMINECTOMY/DECOMPRESSION MICRODISCECTOMY N/A 03/03/2018   Procedure: LUMBAR LAMINECTOMY/DECOMPRESSION MICRODISCECTOMY 1 LEVEL-L3-4,L4-5;  Surgeon: Meade Maw, MD;  Location: [facility];  Service: Neurosurgery;  Laterality: N/A;  . LUMBAR  LAMINECTOMY/DECOMPRESSION MICRODISCECTOMY N/A 02/24/2020   Procedure: LEFT L3-4 MICRODISCECTOMY, L4-5 DECOMPRESSION;  Surgeon: Meade Maw, MD;  Location: [facility];  Service: Neurosurgery;  Laterality: N/A;  . MAXIMUM ACCESS (MAS) TRANSFORAMINAL LUMBAR INTERBODY FUSION (TLIF) 1 LEVEL N/A 07/13/2020   Procedure: OPEN L3-4 TRANSFORAMINAL LUMBAR INTERBODY FUSION (TLIF);  Surgeon: Meade Maw, MD;  Location: [facility];  Service: Neurosurgery;  Laterality: N/A;  . PARTIAL HIP ARTHROPLASTY Right 2014  . PORT-A-CATH REMOVAL    . PORTACATH PLACEMENT Left 01/12/2020   Procedure: INSERTION PORT-A-CATH, with ultrasound fluoroscopy;  Surgeon: Nestor Lewandowsky, MD;  Location: [facility];  Service: General;  Laterality: Left;  . TONGUE BIOPSY  2006  . TONSILLECTOMY    . TRIGGER FINGER RELEASE Right 2013  . UPPER GI ENDOSCOPY  01/25/14   multiple gastric polyps   Social History:  reports that he quit smoking about 28 years ago. His smoking use included cigarettes. He has a 30.00 pack-year smoking history. He has never used smokeless tobacco. He reports that he does not currently use alcohol. He reports that he does not use drugs.  Allergies  Allergen Reactions  . Sulfa Antibiotics Rash    Family History  Problem Relation Age of Onset  . Diabetes Mother   . Congestive Heart Failure Mother   . Breast cancer Mother   . Lung cancer Father     Prior to Admission medications   Medication Sig Start Date End Date Taking? Authorizing Provider  aspirin 81 MG EC tablet Take 1 tablet (81 mg total) by mouth daily at 6 (six) AM. Swallow whole. 07/06/21   Kris Hartmann, NP  atorvastatin (LIPITOR) 80 MG tablet TAKE 1 TABLET BY MOUTH ONCE DAILY 02/13/20 04/30/21  Pricilla Larsson, NP  azelastine (ASTELIN) 0.1 % nasal spray Place 2 sprays into both nostrils 2 (two) times daily. Use in each nostril as directed 02/24/22   Martyn Ehrich, NP  clopidogrel (PLAVIX) 75 MG tablet Take 1 tablet (75 mg total) by  mouth daily. 02/25/22   Algernon Huxley, MD  diltiazem (CARDIZEM CD) 180 MG 24 hr capsule Take 1 capsule (180 mg total) by mouth daily. 04/22/22     erythromycin ophthalmic ointment Place 1 Application into the right eye 3 (three) times daily for 10 days. 04/22/22     ibuprofen (ADVIL) 800 MG tablet Take 1 tablet (800 mg total) by mouth every 8 (eight) hours as needed for pain. 04/22/22     lisinopril (ZESTRIL) 10 MG tablet TAKE 1 TABLET (10 MG TOTAL) BY MOUTH DAILY. 01/13/22 01/13/23    methocarbamol (ROBAXIN) 500 MG tablet Take 1 and 1/2 tablets (750 mg total) by mouth every 6 (six) hours as needed for muscle spasms. 06/24/21     methocarbamol (ROBAXIN) 500 MG tablet Take 1 tablet (500 mg total) by mouth every 6 (six) hours as needed. 04/22/22     metoprolol succinate (TOPROL-XL) 100 MG 24 hr tablet Take 2 tablets (200 mg total) by mouth once daily 12/17/21  mirtazapine (REMERON) 15 MG tablet Take 1 tablet (15 mg total) by mouth nightly 07/09/21     omeprazole (PRILOSEC) 40 MG capsule Take 1 capsule (40 mg total) by mouth once daily 09/23/21     rosuvastatin (CRESTOR) 10 MG tablet Take 1 tablet (10 mg total) by mouth once daily 04/22/22     tamsulosin (FLOMAX) 0.4 MG CAPS capsule TAKE 1 CAPSULE BY MOUTH ONCE DAILY. TAKE 30 MINUTES AFTER SAME MEAL EACH DAY. 11/21/21 11/21/22    Tiotropium Bromide Monohydrate (SPIRIVA RESPIMAT) 2.5 MCG/ACT AERS Inhale 2 puffs into the lungs daily. 02/24/22   Martyn Ehrich, NP  prochlorperazine (COMPAZINE) 10 MG tablet Take 1 tablet (10 mg total) by mouth every 6 (six) hours as needed (Nausea or vomiting). 12/09/19 12/30/19  Earlie Server, MD    Physical Exam: Vitals:   04/25/22 2130 04/25/22 2200 04/25/22 2230 04/25/22 2300  BP: 123/61 134/62 (!) 138/57 137/88  Pulse: (!) 59 (!) 59 62 62  Resp: '18 17 19 17  '$ Temp:      TempSrc:      SpO2: 99% 98% 100% 96%  Weight:       Neuro: Alert awake oriented x 3 no focal deficit no distress Respiratory exam: Bilateral air entry  vesicular Cardiovascular exam S1-S2 normal Abdomen soft nontender Skin warm no edema  Data Reviewed:   EKG - NSR  Results for orders placed or performed during the hospital encounter of 04/25/22 (from the past 24 hour(s))  Basic metabolic panel     Status: Abnormal   Collection Time: 04/25/22  9:14 PM  Result Value Ref Range   Sodium 138 135 - 145 mmol/L   Potassium 4.1 3.5 - 5.1 mmol/L   Chloride 107 98 - 111 mmol/L   CO2 18 (L) 22 - 32 mmol/L   Glucose, Bld 152 (H) 70 - 99 mg/dL   BUN 18 8 - 23 mg/dL   Creatinine, Ser 1.24 0.61 - 1.24 mg/dL   Calcium 8.1 (L) 8.9 - 10.3 mg/dL   GFR, Estimated >60 >60 mL/min   Anion gap 13 5 - 15  CBC     Status: Abnormal   Collection Time: 04/25/22  9:14 PM  Result Value Ref Range   WBC 9.9 4.0 - 10.5 K/uL   RBC 5.04 4.22 - 5.81 MIL/uL   Hemoglobin 11.8 (L) 13.0 - 17.0 g/dL   HCT 41.4 39.0 - 52.0 %   MCV 82.1 80.0 - 100.0 fL   MCH 23.4 (L) 26.0 - 34.0 pg   MCHC 28.5 (L) 30.0 - 36.0 g/dL   RDW 16.4 (H) 11.5 - 15.5 %   Platelets 252 150 - 400 K/uL   nRBC 0.0 0.0 - 0.2 %  Troponin I (High Sensitivity)     Status: None   Collection Time: 04/25/22  9:14 PM  Result Value Ref Range   Troponin I (High Sensitivity) 8 <18 ng/L  D-dimer, quantitative     Status: Abnormal   Collection Time: 04/25/22 10:45 PM  Result Value Ref Range   D-Dimer, Quant 0.77 (H) 0.00 - 0.50 ug/mL-FEU   IMPRESSION: 1. No acute intracranial abnormality. 2. 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 3. Stable atrophy and chronic small vessel ischemia   IMPRESSION: Reticulonodular opacities in the right lung are similar to slightly increased from 06/26/2020 and suggestive of bronchopneumonia. Assessment and Plan: No notes have been filed under this hospital service. Service: Hospitalist  Advance Care Planning:   Code Status: Prior ***  Consults: ***  Family Communication:  ***  Severity of Illness: {Observation/Inpatient:21159}  Author: Gertie Fey, MD 04/25/2022 11:25 PM  For on call review [email or web address].

## 2022-04-25 NOTE — H&P (Signed)
History and Physical    Patient: Blake [name] TML:[id number] DOB: 05-10-1945 DOA: 04/25/2022 DOS: the patient was seen and examined on 04/25/2022 PCP: Idelle Crouch, MD  Patient coming from: Home  Chief Complaint:  Chief Complaint  Patient presents with   Loss of Consciousness   HPI: Blake [name] is a 77 y.o. male with medical history significant of right carotid artery stenting and starting of dilitazem recently.  Patient was in his usual state of health till approximately 8 PM this evening when he was at a get together and had a couple of drinks of beer already.  At the time patient reports that he had an exacerbation of his chronic low back pain that he often gets while prolonged standing.  Therefore patient was able to help himself to a chair and sat down.  And immediately after patient reports feeling "unwell "patient is not able to further clarify this.  Patient does not report any focal weakness or focal sensory symptoms no palpitations no chest pain no trouble breathing.  Patient subsequently lost consciousness.  Unfortunately I do not have any of the witnesses of the event directly to interview.  Patient reports waking up in the EMS ambulance. Recalls being sweaty. And he had wet himself.  Patient has since then been pretty much asymptomatic although he is still feeling weak.  There is no again for any chest pain shortness of breath focal weakness palpitation no more presyncope.  Medical evaluation is sought   Review of Systems: As mentioned in the history of present illness. All other systems reviewed and are negative. Past Medical History:  Diagnosis Date   Arthritis    Back pain    with leg pain   Basal cell carcinoma 09/04/2009   R cheek 5.5 cm ant to earlobe - 12/26/2009 excision   Basal cell carcinoma 09/04/2009   R ant nasal alar rim   Basal cell carcinoma 07/31/2014   R mid brow   Basal cell carcinoma 07/30/2016   R ant nasal alar rim at ant edge of BCC scar    Basal cell carcinoma 11/02/2019   L zygoma    Basal cell carcinoma 07/22/2021   left sideburn area, EDC   Basal cell carcinoma 01/02/2022   Right posterior shoulder, EDC   Basal cell carcinoma 01/02/2022   R posterior shoulder, EDC   Basal cell carcinoma 01/02/2022   Right Supraclavicular Base of Neck, EDC   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   s/p chemoradiation   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, bilateral    Dyspnea    Dysrhythmia    Esophageal cancer ([place]) 2021   GERD (gastroesophageal reflux disease)    Hyperlipidemia    Hypertension    Numbness and tingling of both lower extremities    with positioning   Paroxysmal atrial fibrillation ([place])    Port-A-Cath in place 2006   Prostate enlargement    Squamous cell carcinoma of skin 12/14/2019   L pretibial - ED&C    T2DM (type 2 diabetes mellitus) ([place])    Past Surgical History:  Procedure Laterality Date   ABDOMINAL SURGERY     CAROTID PTA/STENT INTERVENTION Right 07/04/2021   Procedure: CAROTID PTA/STENT INTERVENTION;  Surgeon: Algernon Huxley, MD;  Location: [facility];  Service: Cardiovascular;  Laterality: Right;   COLONOSCOPY  12/08/2003   COLONOSCOPY WITH PROPOFOL N/A 03/21/2019   Procedure: COLONOSCOPY WITH PROPOFOL;  Surgeon: Toledo, Benay Pike, MD;  Location: [facility];  Service: Gastroenterology;  Laterality: N/A;   ESOPHAGOGASTRODUODENOSCOPY (EGD) WITH PROPOFOL N/A 03/21/2019   Procedure: ESOPHAGOGASTRODUODENOSCOPY (EGD) WITH PROPOFOL;  Surgeon: Toledo, Benay Pike, MD;  Location: [facility];  Service: Gastroenterology;  Laterality: N/A;   esophogeal cancer     JOINT REPLACEMENT Right    total hip   LUMBAR LAMINECTOMY/DECOMPRESSION MICRODISCECTOMY N/A 03/03/2018   Procedure: LUMBAR LAMINECTOMY/DECOMPRESSION MICRODISCECTOMY 1 LEVEL-L3-4,L4-5;  Surgeon: Meade Maw, MD;  Location: [facility];  Service: Neurosurgery;  Laterality: N/A;   LUMBAR LAMINECTOMY/DECOMPRESSION  MICRODISCECTOMY N/A 02/24/2020   Procedure: LEFT L3-4 MICRODISCECTOMY, L4-5 DECOMPRESSION;  Surgeon: Meade Maw, MD;  Location: [facility];  Service: Neurosurgery;  Laterality: N/A;   MAXIMUM ACCESS (MAS) TRANSFORAMINAL LUMBAR INTERBODY FUSION (TLIF) 1 LEVEL N/A 07/13/2020   Procedure: OPEN L3-4 TRANSFORAMINAL LUMBAR INTERBODY FUSION (TLIF);  Surgeon: Meade Maw, MD;  Location: [facility];  Service: Neurosurgery;  Laterality: N/A;   PARTIAL HIP ARTHROPLASTY Right 2014   PORT-A-CATH REMOVAL     PORTACATH PLACEMENT Left 01/12/2020   Procedure: INSERTION PORT-A-CATH, with ultrasound fluoroscopy;  Surgeon: Nestor Lewandowsky, MD;  Location: [facility];  Service: General;  Laterality: Left;   TONGUE BIOPSY  2006   TONSILLECTOMY     TRIGGER FINGER RELEASE Right 2013   UPPER GI ENDOSCOPY  01/25/14   multiple gastric polyps   Social History:  reports that he quit smoking about 28 years ago. His smoking use included cigarettes. He has a 30.00 pack-year smoking history. He has never used smokeless tobacco. He reports that he does not currently use alcohol. He reports that he does not use drugs.  Allergies  Allergen Reactions   Sulfa Antibiotics Rash    Family History  Problem Relation Age of Onset   Diabetes Mother    Congestive Heart Failure Mother    Breast cancer Mother    Lung cancer Father     Prior to Admission medications   Medication Sig Start Date End Date Taking? Authorizing Provider  aspirin 81 MG EC tablet Take 1 tablet (81 mg total) by mouth daily at 6 (six) AM. Swallow whole. 07/06/21   Kris Hartmann, NP  atorvastatin (LIPITOR) 80 MG tablet TAKE 1 TABLET BY MOUTH ONCE DAILY 02/13/20 04/30/21  Pricilla Larsson, NP  azelastine (ASTELIN) 0.1 % nasal spray Place 2 sprays into both nostrils 2 (two) times daily. Use in each nostril as directed 02/24/22   Martyn Ehrich, NP  clopidogrel (PLAVIX) 75 MG tablet Take 1 tablet (75 mg total) by mouth daily. 02/25/22   Algernon Huxley, MD   diltiazem (CARDIZEM CD) 180 MG 24 hr capsule Take 1 capsule (180 mg total) by mouth daily. 04/22/22     erythromycin ophthalmic ointment Place 1 Application into the right eye 3 (three) times daily for 10 days. 04/22/22     ibuprofen (ADVIL) 800 MG tablet Take 1 tablet (800 mg total) by mouth every 8 (eight) hours as needed for pain. 04/22/22     lisinopril (ZESTRIL) 10 MG tablet TAKE 1 TABLET (10 MG TOTAL) BY MOUTH DAILY. 01/13/22 01/13/23    methocarbamol (ROBAXIN) 500 MG tablet Take 1 and 1/2 tablets (750 mg total) by mouth every 6 (six) hours as needed for muscle spasms. 06/24/21     methocarbamol (ROBAXIN) 500 MG tablet Take 1 tablet (500 mg total) by mouth every 6 (six) hours as needed. 04/22/22     metoprolol succinate (TOPROL-XL) 100 MG 24 hr tablet Take 2 tablets (200 mg total) by mouth once daily 12/17/21  mirtazapine (REMERON) 15 MG tablet Take 1 tablet (15 mg total) by mouth nightly 07/09/21     omeprazole (PRILOSEC) 40 MG capsule Take 1 capsule (40 mg total) by mouth once daily 09/23/21     rosuvastatin (CRESTOR) 10 MG tablet Take 1 tablet (10 mg total) by mouth once daily 04/22/22     tamsulosin (FLOMAX) 0.4 MG CAPS capsule TAKE 1 CAPSULE BY MOUTH ONCE DAILY. TAKE 30 MINUTES AFTER SAME MEAL EACH DAY. 11/21/21 11/21/22    Tiotropium Bromide Monohydrate (SPIRIVA RESPIMAT) 2.5 MCG/ACT AERS Inhale 2 puffs into the lungs daily. 02/24/22   Martyn Ehrich, NP  prochlorperazine (COMPAZINE) 10 MG tablet Take 1 tablet (10 mg total) by mouth every 6 (six) hours as needed (Nausea or vomiting). 12/09/19 12/30/19  Earlie Server, MD    Physical Exam: Vitals:   04/25/22 2130 04/25/22 2200 04/25/22 2230 04/25/22 2300  BP: 123/61 134/62 (!) 138/57 137/88  Pulse: (!) 59 (!) 59 62 62  Resp: '18 17 19 17  '$ Temp:      TempSrc:      SpO2: 99% 98% 100% 96%  Weight:       Neuro: Alert awake oriented x 3 no focal deficit no distress Respiratory exam: Bilateral air entry vesicular Cardiovascular exam S1-S2  normal Abdomen soft nontender Skin warm no edema  Data Reviewed:   EKG - NSR  Results for orders placed or performed during the hospital encounter of 04/25/22 (from the past 24 hour(s))  Basic metabolic panel     Status: Abnormal   Collection Time: 04/25/22  9:14 PM  Result Value Ref Range   Sodium 138 135 - 145 mmol/L   Potassium 4.1 3.5 - 5.1 mmol/L   Chloride 107 98 - 111 mmol/L   CO2 18 (L) 22 - 32 mmol/L   Glucose, Bld 152 (H) 70 - 99 mg/dL   BUN 18 8 - 23 mg/dL   Creatinine, Ser 1.24 0.61 - 1.24 mg/dL   Calcium 8.1 (L) 8.9 - 10.3 mg/dL   GFR, Estimated >60 >60 mL/min   Anion gap 13 5 - 15  CBC     Status: Abnormal   Collection Time: 04/25/22  9:14 PM  Result Value Ref Range   WBC 9.9 4.0 - 10.5 K/uL   RBC 5.04 4.22 - 5.81 MIL/uL   Hemoglobin 11.8 (L) 13.0 - 17.0 g/dL   HCT 41.4 39.0 - 52.0 %   MCV 82.1 80.0 - 100.0 fL   MCH 23.4 (L) 26.0 - 34.0 pg   MCHC 28.5 (L) 30.0 - 36.0 g/dL   RDW 16.4 (H) 11.5 - 15.5 %   Platelets 252 150 - 400 K/uL   nRBC 0.0 0.0 - 0.2 %  Troponin I (High Sensitivity)     Status: None   Collection Time: 04/25/22  9:14 PM  Result Value Ref Range   Troponin I (High Sensitivity) 8 <18 ng/L  D-dimer, quantitative     Status: Abnormal   Collection Time: 04/25/22 10:45 PM  Result Value Ref Range   D-Dimer, Quant 0.77 (H) 0.00 - 0.50 ug/mL-FEU   IMPRESSION: 1. No acute intracranial abnormality. 2. 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 3. Stable atrophy and chronic small vessel ischemia   IMPRESSION: Reticulonodular opacities in the right lung are similar to slightly increased from 06/26/2020 and suggestive of bronchopneumonia. Assessment and Plan: * Loss of consciousness ([place]) Maintain on telemetry, check echo, check serum glucose.  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  Start oral sodium bicarbonate  Positive D dimer Interested in getting CAT scan with contrast again.  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  Patient reported some paresthesia of the left arm to ER attending, however denied any to me.  And subsequently said that he tends to have paresthesias related to postural changes due to his back issues.  Regardless we will get an EEG and proceed from there      Advance Care Planning:   Code Status: Prior full code  Consults: Depending on workup above  Family Communication: Friend at bedside  Severity of Illness: The appropriate patient status for this patient is OBSERVATION. Observation status is judged to be reasonable and necessary in order to provide the required intensity of service to ensure the patient's safety. The patient's presenting symptoms, physical exam findings, and initial radiographic and laboratory data in the context of their medical condition is felt to place them at decreased risk for further clinical deterioration. Furthermore, it is anticipated that the patient will be medically stable for discharge from the hospital within 2 midnights of admission.   Author: Gertie Fey, MD 04/25/2022 11:25 PM  For on call review [email or web address].

## 2022-04-25 NOTE — ED Triage Notes (Signed)
Pt in via [facility] after syncopal episode while dancing at a [employer]. Per EMS, entire event was witnessed, and pt was helped to chair before passing out, LOC x 5 min per witnesses. +urine incontinence on self, no seizure activity witnessed. Pt does state he was placed on new medication 2 days ago (Diltiazem), also takes Metoprolol daily. Denies any cp in triage. EMS states initial pressures 60/40's. Given 543m NS en route. BP 122/64 in triage

## 2022-04-25 NOTE — ED Provider Notes (Signed)
[facility] Provider Note    Event Date/Time   First MD Initiated Contact with Patient 04/25/22 2127     (approximate)   History   Loss of Consciousness   HPI  Blake [name] is a 77 y.o. male history of esophageal cancer as well as carotid stenosis no history of CVA has a history of CAD on Plavix presents to the ER after syncopal event that occurred while he was at a dancing event this evening.  States that he has recently had some medication changes including recently started on diltiazem.  Was otherwise feeling well today.  During the dancing event he started having some numbness and tingling in his left arm and then lost consciousness.  This was witnessed.  He did not fall and hit his head.  The next thing he remembers was being in the back of the ambulance.  There is no witnessed seizure-like activity.  He denies any chest pain or pressure no shortness of breath.     Physical Exam   Triage Vital Signs: ED Triage Vitals  Enc Vitals Group     BP 04/25/22 2110 122/64     Pulse Rate 04/25/22 2110 (!) 57     Resp 04/25/22 2110 20     Temp 04/25/22 2110 97.9 F (36.6 C)     Temp Source 04/25/22 2110 Oral     SpO2 04/25/22 2110 98 %     Weight 04/25/22 2111 155 lb (70.3 kg)     Height --      Head Circumference --      Peak Flow --      Pain Score 04/25/22 2111 0     Pain Loc --      Pain Edu? --      Excl. in [place]? --     Most recent vital signs: Vitals:   04/25/22 2230 04/25/22 2300  BP: (!) 138/57 137/88  Pulse: 62 62  Resp: 19 17  Temp:    SpO2: 100% 96%     Constitutional: Alert  Eyes: Conjunctivae are normal.  Head: Atraumatic. Nose: No congestion/rhinnorhea. Mouth/Throat: Mucous membranes are moist.   Neck: Painless ROM.  Cardiovascular:   Good peripheral circulation. Respiratory: Normal respiratory effort.  No retractions.  Gastrointestinal: Soft and nontender.  Musculoskeletal:  no deformity Neurologic:  CN- intact.  No  facial droop, Normal FNF.  Normal heel to shin.  Sensation intact bilaterally. Normal speech and language. No gross focal neurologic deficits are appreciated. No gait instability. Skin:  Skin is warm, dry and intact. No rash noted. Psychiatric: Mood and affect are normal. Speech and behavior are normal.    ED Results / Procedures / Treatments   Labs (all labs ordered are listed, but only abnormal results are displayed) Labs Reviewed  BASIC METABOLIC PANEL - Abnormal; Notable for the following components:      Result Value   CO2 18 (*)    Glucose, Bld 152 (*)    Calcium 8.1 (*)    All other components within normal limits  CBC - Abnormal; Notable for the following components:   Hemoglobin 11.8 (*)    MCH 23.4 (*)    MCHC 28.5 (*)    RDW 16.4 (*)    All other components within normal limits  D-DIMER, QUANTITATIVE - Abnormal; Notable for the following components:   D-Dimer, Quant 0.77 (*)    All other components within normal limits  PROTIME-INR  APTT  URINALYSIS, ROUTINE W REFLEX  MICROSCOPIC  BASIC METABOLIC PANEL  CBC  HEMOGLOBIN A1C  CHLORIDE, URINE, RANDOM  NA AND K (SODIUM & POTASSIUM), RAND UR  URINE PH  HEPATIC FUNCTION PANEL  IRON AND TIBC  FERRITIN  RETICULOCYTES  FOLATE  HAPTOGLOBIN  VITAMIN B12  CBG MONITORING, ED  TROPONIN I (HIGH SENSITIVITY)  TROPONIN I (HIGH SENSITIVITY)     EKG  ED ECG REPORT I, Merlyn Lot, the attending physician, personally viewed and interpreted this ECG.   Date: 04/26/2022  EKG Time: 21:11  Rate: 60  Rhythm: sinus  Axis: normal  Intervals: normal  ST&T Change: no stemi, no depressions    RADIOLOGY Please see ED Course for my review and interpretation.  I personally reviewed all radiographic images ordered to evaluate for the above acute complaints and reviewed radiology reports and findings.  These findings were personally discussed with the patient.  Please see medical record for radiology  report.    PROCEDURES:  Critical Care performed: No  Procedures   MEDICATIONS ORDERED IN ED: Medications  aspirin EC tablet 81 mg (has no administration in time range)  rosuvastatin (CRESTOR) tablet 10 mg (has no administration in time range)  lisinopril (ZESTRIL) tablet 10 mg (has no administration in time range)  metoprolol succinate (TOPROL-XL) 24 hr tablet 200 mg (has no administration in time range)  mirtazapine (REMERON) tablet 15 mg (has no administration in time range)  pantoprazole (PROTONIX) EC tablet 40 mg (has no administration in time range)  tamsulosin (FLOMAX) capsule 0.4 mg (has no administration in time range)  clopidogrel (PLAVIX) tablet 75 mg (has no administration in time range)  azelastine (ASTELIN) 0.1 % nasal spray 2 spray (has no administration in time range)  tiotropium (SPIRIVA) inhalation capsule ([facility] use ONLY) 18 mcg (has no administration in time range)  erythromycin ophthalmic ointment 1 Application (has no administration in time range)  enoxaparin (LOVENOX) injection 40 mg (has no administration in time range)  sodium chloride flush (NS) 0.9 % injection 3 mL (has no administration in time range)  insulin aspart (novoLOG) injection 0-9 Units (has no administration in time range)  insulin aspart (novoLOG) injection 0-5 Units (has no administration in time range)  sodium bicarbonate tablet 650 mg (has no administration in time range)  sodium chloride 0.9 % bolus 1,000 mL (0 mLs Intravenous Stopped 04/25/22 2309)     IMPRESSION / MDM / ASSESSMENT AND PLAN / ED COURSE  I reviewed the triage vital signs and the nursing notes.                              Differential diagnosis includes, but is not limited to, TIA, CVA, IPH, anemia, electrolyte abnormality, hypoglycemia, dysrhythmia, seizure  Patient presenting to the ER for evaluation of symptoms as described above.  Based on symptoms, risk factors and considered above differential, this presenting  complaint could reflect a potentially life-threatening illness therefore the patient will be placed on continuous pulse oximetry and telemetry for monitoring.  Laboratory evaluation will be sent to evaluate for the above complaints.  X-ray as well as CT imaging will be ordered for above differential.  Clinical Course as of 04/26/22 0004  Fri Apr 25, 2022  2205 Chest x-ray my review and interpretation concerning for infiltrates in the right hemithorax.  Will await formal radiology report. [PR]  2218 Findings can concerning for bronchopneumonia according to radiology on chest x-ray.  He does not have any white count but recent  diagnosis of [PR]    Clinical Course User Index [PR] Merlyn Lot, MD   CT imaging of the head with findings concerning for encephalomalacia right parietal lobe.  Given his syncopal event will order MRI to evaluate for acute CVA and further evaluate possible TIA.  Had ordered CTA given his presentation given concern for possible PE patient declined CTA.  Order D-dimer to further rule stratify.  Essentially equivocal based on age-adjusted limit.  This was discussed with hospitalist in consultation for admission..  Will hold off on CTA at this time.   Possible vasovagal hypotensive episode.  Currently normotensive.  Based on his presentation will require hospitalization.  Patient agreeable plan.   FINAL CLINICAL IMPRESSION(S) / ED DIAGNOSES   Final diagnoses:  Syncope and collapse     Rx / DC Orders   ED Discharge Orders     None        Note:  This document was prepared using Dragon voice recognition software and may include unintentional dictation errors.    Merlyn Lot, MD 04/26/22 [phone]

## 2022-04-26 ENCOUNTER — Other Ambulatory Visit: Payer: Self-pay | Admitting: Internal Medicine

## 2022-04-26 ENCOUNTER — Observation Stay (HOSPITAL_BASED_OUTPATIENT_CLINIC_OR_DEPARTMENT_OTHER)
Admit: 2022-04-26 | Discharge: 2022-04-26 | Disposition: A | Discharge status: Home / Self Care | Payer: Medicare HMO | Attending: Internal Medicine | Admitting: Internal Medicine

## 2022-04-26 ENCOUNTER — Observation Stay
Admit: 2022-04-26 | Discharge: 2022-04-26 | Disposition: A | Discharge status: Home / Self Care | Payer: Medicare HMO | Attending: Internal Medicine | Admitting: Internal Medicine

## 2022-04-26 ENCOUNTER — Observation Stay: Payer: Medicare HMO

## 2022-04-26 ENCOUNTER — Encounter: Payer: Self-pay | Admitting: Internal Medicine

## 2022-04-26 DIAGNOSIS — I639 Cerebral infarction, unspecified: Secondary | ICD-10-CM

## 2022-04-26 DIAGNOSIS — G319 Degenerative disease of nervous system, unspecified: Secondary | ICD-10-CM | POA: Diagnosis not present

## 2022-04-26 DIAGNOSIS — D519 Vitamin B12 deficiency anemia, unspecified: Secondary | ICD-10-CM

## 2022-04-26 DIAGNOSIS — R600 Localized edema: Secondary | ICD-10-CM | POA: Diagnosis not present

## 2022-04-26 DIAGNOSIS — M47812 Spondylosis without myelopathy or radiculopathy, cervical region: Secondary | ICD-10-CM | POA: Diagnosis not present

## 2022-04-26 DIAGNOSIS — R4020 Unspecified coma: Secondary | ICD-10-CM

## 2022-04-26 DIAGNOSIS — R918 Other nonspecific abnormal finding of lung field: Secondary | ICD-10-CM

## 2022-04-26 DIAGNOSIS — R55 Syncope and collapse: Secondary | ICD-10-CM | POA: Diagnosis not present

## 2022-04-26 DIAGNOSIS — D649 Anemia, unspecified: Secondary | ICD-10-CM

## 2022-04-26 DIAGNOSIS — Q283 Other malformations of cerebral vessels: Secondary | ICD-10-CM | POA: Diagnosis not present

## 2022-04-26 DIAGNOSIS — R001 Bradycardia, unspecified: Secondary | ICD-10-CM

## 2022-04-26 LAB — ECHOCARDIOGRAM COMPLETE BUBBLE STUDY
AR max vel: 1.58 cm2
AV Area VTI: 1.65 cm2
AV Area mean vel: 1.54 cm2
AV Mean grad: 5 mmHg
AV Peak grad: 10.2 mmHg
Ao pk vel: 1.6 m/s
Area-P 1/2: 2.66 cm2
Calc EF: 71.3 %
S' Lateral: 2.7 cm
Single Plane A2C EF: 73.3 %
Single Plane A4C EF: 67.8 %

## 2022-04-26 LAB — BASIC METABOLIC PANEL
Anion gap: 7 (ref 5–15)
BUN: 16 mg/dL (ref 8–23)
CO2: 26 mmol/L (ref 22–32)
Calcium: 8.1 mg/dL — ABNORMAL LOW (ref 8.9–10.3)
Chloride: 108 mmol/L (ref 98–111)
Creatinine, Ser: 1.05 mg/dL (ref 0.61–1.24)
GFR, Estimated: >60 mL/min (ref >60)
Glucose, Bld: 120 mg/dL — ABNORMAL HIGH (ref 70–99)
Potassium: 4.2 mmol/L (ref 3.5–5.1)
Sodium: 141 mmol/L (ref 135–145)

## 2022-04-26 LAB — HEPATIC FUNCTION PANEL
ALT: 15 U/L (ref 0–44)
AST: 19 U/L (ref 15–41)
Albumin: 3.9 g/dL (ref 3.5–5.0)
Alkaline Phosphatase: 77 U/L (ref 38–126)
Bilirubin, Direct: <0.1 mg/dL (ref 0.0–0.2)
Total Bilirubin: 0.6 mg/dL (ref 0.3–1.2)
Total Protein: 7 g/dL (ref 6.5–8.1)

## 2022-04-26 LAB — FOLATE: Folate: 11.5 ng/mL

## 2022-04-26 LAB — CBC
HCT: 37.8 % — ABNORMAL LOW (ref 39.0–52.0)
Hemoglobin: 11.3 g/dL — ABNORMAL LOW (ref 13.0–17.0)
MCH: 23.7 pg — ABNORMAL LOW (ref 26.0–34.0)
MCHC: 29.9 g/dL — ABNORMAL LOW (ref 30.0–36.0)
MCV: 79.4 fL — ABNORMAL LOW (ref 80.0–100.0)
Platelets: 257 10*3/uL (ref 150–400)
RBC: 4.76 MIL/uL (ref 4.22–5.81)
RDW: 16.3 % — ABNORMAL HIGH (ref 11.5–15.5)
WBC: 9.5 10*3/uL (ref 4.0–10.5)
nRBC: 0 % (ref 0.0–0.2)

## 2022-04-26 LAB — IRON AND TIBC
Iron: 41 ug/dL — ABNORMAL LOW (ref 45–182)
Saturation Ratios: 10 % — ABNORMAL LOW (ref 17.9–39.5)
TIBC: 405 ug/dL (ref 250–450)
UIBC: 364 ug/dL

## 2022-04-26 LAB — GLUCOSE, CAPILLARY
Glucose-Capillary: 123 mg/dL — ABNORMAL HIGH (ref 70–99)
Glucose-Capillary: 125 mg/dL — ABNORMAL HIGH (ref 70–99)
Glucose-Capillary: 178 mg/dL — ABNORMAL HIGH (ref 70–99)
Glucose-Capillary: 99 mg/dL (ref 70–99)

## 2022-04-26 LAB — RETICULOCYTES
Immature Retic Fract: 22.9 % — ABNORMAL HIGH (ref 2.3–15.9)
RBC.: 4.76 MIL/uL (ref 4.22–5.81)
Retic Count, Absolute: 55.2 10*3/uL (ref 19.0–186.0)
Retic Ct Pct: 1.2 % (ref 0.4–3.1)

## 2022-04-26 LAB — VITAMIN B12: Vitamin B-12: 137 pg/mL — ABNORMAL LOW (ref 180–914)

## 2022-04-26 LAB — HEMOGLOBIN A1C
Hgb A1c MFr Bld: 7 % — ABNORMAL HIGH (ref 4.8–5.6)
Mean Plasma Glucose: 154.2 mg/dL

## 2022-04-26 LAB — TROPONIN I (HIGH SENSITIVITY): Troponin I (High Sensitivity): 8 ng/L (ref <18)

## 2022-04-26 LAB — FERRITIN: Ferritin: 10 ng/mL — ABNORMAL LOW (ref 24–336)

## 2022-04-26 MED ORDER — FERROUS SULFATE 325 (65 FE) MG PO TABS
325.0000 mg | ORAL_TABLET | Freq: Two times a day (BID) | ORAL | Status: DC
  Administered 2022-04-26: 325 mg via ORAL
  Filled 2022-04-26: qty 1

## 2022-04-26 MED ORDER — IOHEXOL 350 MG/ML SOLN
75.0000 mL | Freq: Once | INTRAVENOUS | Status: AC | PRN
  Administered 2022-04-26: 75 mL via INTRAVENOUS

## 2022-04-26 MED ORDER — CYANOCOBALAMIN 1000 MCG/ML IJ SOLN
1000.0000 ug | Freq: Once | INTRAMUSCULAR | Status: AC
  Administered 2022-04-26: 1000 ug via INTRAMUSCULAR
  Filled 2022-04-26: qty 1

## 2022-04-26 MED ORDER — CYANOCOBALAMIN 1000 MCG PO TABS
1000.0000 ug | ORAL_TABLET | Freq: Every day | ORAL | 0 refills | Status: AC
  Filled 2022-04-26: qty 90, 90d supply, fill #0

## 2022-04-26 MED ORDER — VITAMIN B-12 1000 MCG PO TABS: 1000.0000 ug | ORAL_TABLET | Freq: Every day | ORAL | Status: DC

## 2022-04-26 MED ORDER — FERROUS SULFATE 325 (65 FE) MG PO TABS
325.0000 mg | ORAL_TABLET | Freq: Two times a day (BID) | ORAL | 3 refills | Status: AC
  Filled 2022-04-26: qty 60, 30d supply, fill #0
  Filled 2022-07-22: qty 60, 30d supply, fill #1

## 2022-04-26 MED ORDER — DILTIAZEM HCL ER COATED BEADS 180 MG PO CP24
180.0000 mg | ORAL_CAPSULE | Freq: Every day | ORAL | 3 refills | Status: DC
  Filled 2022-04-26 – 2022-11-24 (×2): qty 90, 90d supply, fill #0
  Filled 2023-03-18: qty 90, 90d supply, fill #1

## 2022-04-26 NOTE — Discharge Summary (Signed)
Physician Discharge Summary   Patient: Blake [name] MRN: [id number] DOB: 1945/10/04  Admit date:     04/25/2022  Discharge date: 04/26/22  Discharge Physician: Blake [name]   PCP: Blake Crouch, MD   Recommendations at discharge:  Please follow-up with vascular surgery according to the scheduled appointment next week. 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   Blake [name] is a 77 y.o. male with medical history significant of right carotid artery stenting and starting of dilitazem recently presented with concern of loss of consciousness. Patient was in his usual state of health till approximately 8 PM this evening when he was at a get together and had a couple of drinks of beer already. At the time patient reports that he had an exacerbation of his chronic low back pain that he often gets while prolonged standing. Therefore patient was able to help himself to a chair and sat down. And immediately after patient reports feeling "unwell "patient is not able to further clarify this. Patient does not report any focal weakness or focal sensory symptoms no palpitations no chest pain no trouble breathing. Patient subsequently lost consciousness.  Patient reports waking up in the EMS ambulance. Recalls being sweaty. And he had wet himself.  Patient has since then been pretty much asymptomatic although he is still feeling weak.  No chest pain, shortness of breath or focal weakness.  On arrival patient with mild sinus bradycardia otherwise unremarkable vitals.  Labs pertinent for hemoglobin of 11.8, D-dimer elevated at 0.77, rest of the  labs mostly unremarkable.  CT head with no acute intracranial abnormality.  Encephalomalacia in the right parietal lobe is new from 2022 exam suggestive of interval but chronic ischemia.  Chest x-ray with reticulonodular opacities in the lateral right lung are similar to slightly increased from prior exam as suggestive of bronchopneumonia. Home diltiazem was held.  1/27: Vitals stable.  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  Lower extremity venous Doppler was negative for DVT.  Anemia panel with B12 low at 137, and iron deficiency.  Patient was given 1 dose of IM B12 and started on p.o. supplement along with some iron supplements.  PCP should be able to follow-up and advise further.  Patient appears to be at baseline and would like to go home.  Neurology was also consulted for concern of small infarct on MRI.  Most likely not the cause of this syncopal episode.  It appears to be a vasovagal syncopal episode which triggered by lower back pain and standing for prolonged.  Of time.  mild sinus bradycardia can be contributory, patient was recently started on diltiazem by his doctor along with high-dose of metoprolol which he was taking before for concern of intermittent tachycardia.  Patient denies any history of A-fib.  We arranged ZIO monitor to be mailed to his house and he will follow-up with cardiology for results. Neurology was also advising to get an EEG which can be done as an outpatient.  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  Irregular stenosis  of proximal left ICA with at least 60% stenosis.  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  Mild stenosis of less than 50% at the origin of right vertebral artery.  Multiple pulmonary nodules are present within the right lung, not significantly changed from the prior exam. Echocardiogram  was without any significant abnormality.  Patient has his vascular surgery follow-up appointment next Monday which he will continue so they can review his images.  Patient will continue with rest of his home medications except advised to hold diltiazem and need to have a close follow-up with his providers for further recommendations.    Assessment and Plan: * Loss of consciousness ([place]) Maintain on telemetry, check echo, check serum glucose.  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  Start oral sodium bicarbonate  Positive D dimer Interested in getting CAT scan with contrast again.  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  Patient reported some paresthesia of the left arm to ER attending, however denied any to me.  And subsequently said that he tends to have paresthesias related to postural changes due to his back issues.  Regardless we will get an EEG and proceed from there   Consultants: Neurology Procedures performed: None Disposition: Home Diet recommendation:  Discharge Diet Orders (From admission, onward)     Start     Ordered   04/26/22 0000  Diet - low sodium heart healthy        04/26/22 1820           Cardiac and Carb modified diet DISCHARGE MEDICATION: Allergies as of 04/26/2022       Reactions   Sulfa Antibiotics Rash        Medication List     STOP taking these medications    atorvastatin 80 MG tablet Commonly known as: LIPITOR       TAKE these medications    aspirin EC 81 MG tablet Take 1 tablet (81 mg total) by mouth daily at 6 (six) AM. Swallow  whole.   azelastine 0.1 % nasal spray Commonly known as: ASTELIN Place 2 sprays into both nostrils 2 (two) times daily. Use in each nostril as directed   clopidogrel 75 MG tablet Commonly known as: PLAVIX Take 1 tablet (75 mg total) by mouth daily.   cyanocobalamin 1000 MCG tablet Take 1 tablet (1,000 mcg total) by mouth daily. Start taking on: April 27, 2022   diltiazem 180 MG 24 hr capsule Commonly known as: CARDIZEM CD Take 1 capsule (180 mg total) by mouth daily. Please hold until you see your cardiologist What changed: additional instructions   erythromycin ophthalmic ointment Place 1 Application into the right eye 3 (three) times daily for 10 days.   ferrous sulfate 325 (65 FE) MG tablet Take 1 tablet (325 mg total) by mouth 2 (two) times daily with a meal.   ibuprofen 800 MG tablet Commonly known as: ADVIL Take 1 tablet (800 mg total) by mouth every 8 (eight) hours as needed for pain.   lisinopril 10 MG tablet Commonly known as: ZESTRIL TAKE 1 TABLET (10 MG TOTAL) BY MOUTH DAILY.   methocarbamol 500 MG tablet Commonly  known as: ROBAXIN Take 1 tablet (500 mg total) by mouth every 6 (six) hours as needed. What changed: Another medication with the same name was removed. Continue taking this medication, and follow the directions you see here.   metoprolol succinate 100 MG 24 hr tablet Commonly known as: TOPROL-XL Take 2 tablets (200 mg total) by mouth once daily   mirtazapine 15 MG tablet Commonly known as: REMERON Take 1 tablet (15 mg total) by mouth nightly   omeprazole 40 MG capsule Commonly known as: PRILOSEC Take 1 capsule (40 mg total) by mouth once daily   rosuvastatin 10 MG tablet Commonly known as: CRESTOR Take 1 tablet (10 mg total) by mouth once daily   Spiriva Respimat 2.5 MCG/ACT Aers Generic drug: Tiotropium Bromide Monohydrate Inhale 2 puffs into the lungs daily.   tamsulosin 0.4 MG Caps capsule Commonly known as: FLOMAX TAKE 1 CAPSULE  BY MOUTH ONCE DAILY. TAKE 30 MINUTES AFTER SAME MEAL EACH DAY.        Follow-up Information     Blake Crouch, MD. Schedule an appointment as soon as possible for a visit in 1 week(s).   Specialty: Internal Medicine Contact information: [address] [address] [phone]         Vladimir Crofts, MD. Schedule an appointment as soon as possible for a visit in 1 week(s).   Specialty: Neurology Contact information: [place] [facility] [place] [address] [phone]                Discharge Exam: Danley Danker Weights   04/25/22 2111  Weight: 70.3 kg   General.     In no acute distress. Pulmonary.  Lungs clear bilaterally, normal respiratory effort. CV.  Regular rate and rhythm, no JVD, rub or murmur. Abdomen.  Soft, nontender, nondistended, BS positive. CNS.  Alert and oriented .  No focal neurologic deficit. Extremities.  No edema, no cyanosis, pulses intact and symmetrical. Psychiatry.  Judgment and insight appears normal.   Condition at discharge: stable  The results of significant diagnostics from this hospitalization (including imaging, microbiology, ancillary and laboratory) are listed below for reference.   Imaging Studies: CT ANGIO HEAD NECK W WO CM  Result Date: 04/26/2022 CLINICAL DATA:  Syncopal episode. Patient reports numbness and tingling in his left arm prior to losing consciousness. No trauma to head. 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 EXAM: CT ANGIOGRAPHY HEAD AND NECK TECHNIQUE: Multidetector CT imaging of the head and neck was performed using the standard protocol during bolus administration of intravenous contrast. Multiplanar CT image reconstructions and MIPs were obtained to evaluate the vascular anatomy. Carotid stenosis measurements (when applicable) are obtained utilizing NASCET criteria, using the distal internal carotid diameter as the denominator.  RADIATION DOSE REDUCTION: This exam was performed according to the departmental dose-optimization program which includes automated exposure control, adjustment of the mA and/or kV according to patient size and/or use of iterative reconstruction technique. CONTRAST:  1m OMNIPAQUE IOHEXOL 350 MG/ML SOLN COMPARISON:  CT head without contrast and MR head without contrast 04/25/2022 FINDINGS: CT HEAD FINDINGS Brain: Acute infarct noted by a MRI is below the resolution of CT. The more remote right parietal infarct is again seen. No infarct expansion or hemorrhage is present. Remote lacunar infarct of the left caudate head is again seen. Basal ganglia are otherwise within normal limits. Mild white matter changes are stable. The ventricles are proportionate to the degree of atrophy. No significant  extraaxial fluid collection is present. The brainstem and cerebellum are within normal limits. Midline structures are within normal limits. Vascular: No hyperdense vessel or unexpected calcification. Skull: Normal. Negative for fracture or focal lesion. Sinuses/Orbits: The paranasal sinuses and mastoid air cells are clear. The globes and orbits are within normal limits. Review of the MIP images confirms the above findings CTA NECK FINDINGS Aortic arch: Atherosclerotic changes are present the aortic arch without significant stenosis at the great vessel origins. Right carotid system: The right common carotid artery is within normal limits. Proximal right ICA stent is patent without significant residual or recurrent stenosis. High-grade stenosis is present immediately distal to the stent. The more distal right ICA demonstrates no additional stenoses. Left carotid system: Right common carotid artery is within normal limits. Atherosclerotic irregularity is present in the proximal left ICA. Vessel lumen is narrowed to 1.5 mm. More distal ICA measures 3.5 mm. No tandem stenoses are present Vertebral arteries: The right vertebral artery  is the dominant vessel. Mild stenosis of less than 50% is present at the origin. No tandem stenoses scratched at no other significant stenosis is present in the right vertebral artery. The left vertebral artery is hypoplastic without focal stenosis. Skeleton: Degenerative changes are present throughout the cervical and upper thoracic spine. Vertebral body heights are maintained. No acute or focal osseous lesion is present. Other neck: Soft tissues the neck are otherwise unremarkable. Salivary glands are within normal limits. Thyroid is normal. No significant adenopathy is present. No focal mucosal or submucosal lesions are present. Upper chest: Multiple pulmonary nodules are present within the right lung, not significantly changed the prior exam. Index nodule posteriorly in the right lower lobe is stable at 14 x 7 mm on image 14 of series 5. Esophagectomy and gastric pull-through again noted. Review of the MIP images confirms the above findings CTA HEAD FINDINGS Anterior circulation: No significant stenosis is present the skull base through the ICA termini. The right A1 segment is dominant. M1 segments are within normal limits bilaterally. The MCA bifurcations are within normal limits. The ACA and MCA branch vessels are within normal limits bilaterally. No significant proximal stenosis or occlusion is present. No aneurysm is present. Posterior circulation: High-grade stenosis is present at the dural margin of the hypoplastic left vertebral artery. The PICA fills from collaterals. Right vertebral artery is unremarkable. The basilar artery is normal. Both posterior cerebral arteries originate from basilar tip. Left posterior communicating artery contributes. PCA branch vessels are within normal limits bilaterally. Venous sinuses: The dural sinuses are patent. The straight sinus and deep cerebral veins are intact. Cortical veins are within normal limits. No significant vascular malformation is evident. Anatomic  variants: Prominent left posterior communicating artery. Review of the MIP images confirms the above findings IMPRESSION: 1. Acute infarct noted by MRI is below the resolution of CT. 2. Stable remote right parietal infarct. 3. Stable remote lacunar infarct of the left caudate head. 4. 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 5. 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 6. 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 7. Mild stenosis of less than 50% at the origin of the right vertebral artery. 8. Multiple pulmonary nodules are present within the right lung, not significantly changed the prior exam. 9.  Aortic Atherosclerosis (ICD10-I70.0). Electronically Signed   By: San Morelle M.D.   On: 04/26/2022 17:42   ECHOCARDIOGRAM COMPLETE  BUBBLE STUDY  Result Date: 04/26/2022    ECHOCARDIOGRAM REPORT   Patient Name:   MYKEL SPONAUGLE Date of Exam: 04/26/2022 Medical Rec #:  [id number]      Height:       65.0 in Accession #:    [id number]     Weight:       155.0 lb Date of Birth:  11/30/1945     BSA:          1.775 m Patient Age:    50 years       BP:           167/85 mmHg Patient Gender: M              HR:           72 bpm. Exam Location:  [facility] Procedure: 2D Echo, Color Doppler, Cardiac Doppler and Saline Contrast Bubble            Study Indications:     TIA  History:         Patient has no prior history of Echocardiogram examinations.                  Arrythmias:P-Afib; Risk Factors:Hypertension, HLD and Diabetes.  Sonographer:     L. Thornton-Maynard Referring Phys:  [id number] Soundra Pilon Reiner Loewen Diagnosing Phys: Fransico Him MD IMPRESSIONS  1. Left ventricular ejection fraction, by estimation, is 70 to 75%. The left ventricle has hyperdynamic function. The left ventricle has no regional wall motion abnormalities. Left ventricular diastolic parameters were normal.  2. Right  ventricular systolic function is normal. The right ventricular size is normal. Tricuspid regurgitation signal is inadequate for assessing PA pressure.  3. The mitral valve is degenerative. Trivial mitral valve regurgitation. No evidence of mitral stenosis.  4. The aortic valve is normal in structure. Aortic valve regurgitation is not visualized. No aortic stenosis is present.  5. The inferior vena cava is normal in size with greater than 50% respiratory variability, suggesting right atrial pressure of 3 mmHg. Conclusion(s)/Recommendation(s): No intracardiac source of embolism detected on this transthoracic study. Consider a transesophageal echocardiogram to exclude cardiac source of embolism if clinically indicated. FINDINGS  Left Ventricle: Left ventricular ejection fraction, by estimation, is 70 to 75%. The left ventricle has hyperdynamic function. The left ventricle has no regional wall motion abnormalities. The left ventricular internal cavity size was normal in size. There is no left ventricular hypertrophy. Left ventricular diastolic parameters were normal. Normal left ventricular filling pressure. Right Ventricle: The right ventricular size is normal. No increase in right ventricular wall thickness. Right ventricular systolic function is normal. Tricuspid regurgitation signal is inadequate for assessing PA pressure. Left Atrium: Left atrial size was normal in size. Right Atrium: Right atrial size was normal in size. Pericardium: There is no evidence of pericardial effusion. Mitral Valve: The mitral valve is degenerative in appearance. There is mild calcification of the mitral valve leaflet(s). Mild to moderate mitral annular calcification. Trivial mitral valve regurgitation. No evidence of mitral valve stenosis. Tricuspid Valve: The tricuspid valve is normal in structure. Tricuspid valve regurgitation is not demonstrated. No evidence of tricuspid stenosis. Aortic Valve: The aortic valve is normal in  structure. Aortic valve regurgitation is not visualized. No aortic stenosis is present. Aortic valve mean gradient measures 5.0 mmHg. Aortic valve peak gradient measures 10.2 mmHg. Aortic valve area, by VTI measures 1.65 cm. Pulmonic Valve: The pulmonic valve was normal in structure. Pulmonic valve regurgitation is not visualized. No evidence of pulmonic  stenosis. Aorta: The aortic root is normal in size and structure. Venous: The inferior vena cava is normal in size with greater than 50% respiratory variability, suggesting right atrial pressure of 3 mmHg. IAS/Shunts: No atrial level shunt detected by color flow Doppler. Agitated saline contrast was given intravenously to evaluate for intracardiac shunting.  LEFT VENTRICLE PLAX 2D LVIDd:         3.70 cm     Diastology LVIDs:         2.70 cm     LV e' medial:    8.38 cm/s LV PW:         0.90 cm     LV E/e' medial:  9.4 LV IVS:        0.90 cm     LV e' lateral:   9.57 cm/s LVOT diam:     1.90 cm     LV E/e' lateral: 8.2 LV SV:         51 LV SV Index:   29 LVOT Area:     2.84 cm  LV Volumes (MOD) LV vol d, MOD A2C: 71.5 ml LV vol d, MOD A4C: 84.8 ml LV vol s, MOD A2C: 19.1 ml LV vol s, MOD A4C: 27.3 ml LV SV MOD A2C:     52.4 ml LV SV MOD A4C:     84.8 ml LV SV MOD BP:      57.2 ml RIGHT VENTRICLE RV S prime:     15.30 cm/s TAPSE (M-mode): 2.1 cm LEFT ATRIUM             Index        RIGHT ATRIUM           Index LA diam:        3.40 cm 1.92 cm/m   RA Area:     16.50 cm LA Vol (A2C):   79.2 ml 44.62 ml/m  RA Volume:   40.90 ml  23.04 ml/m LA Vol (A4C):   83.7 ml 47.15 ml/m LA Biplane Vol: 84.9 ml 47.83 ml/m  AORTIC VALVE AV Area (Vmax):    1.58 cm AV Area (Vmean):   1.54 cm AV Area (VTI):     1.65 cm AV Vmax:           159.50 cm/s AV Vmean:          106.000 cm/s AV VTI:            0.310 m AV Peak Grad:      10.2 mmHg AV Mean Grad:      5.0 mmHg LVOT Vmax:         89.10 cm/s LVOT Vmean:        57.600 cm/s LVOT VTI:          0.180 m LVOT/AV VTI ratio: 0.58   AORTA Ao Root diam: 3.20 cm Ao Asc diam:  2.90 cm MITRAL VALVE MV Area (PHT): 2.66 cm    SHUNTS MV Decel Time: 285 msec    Systemic VTI:  0.18 m MV E velocity: 78.60 cm/s  Systemic Diam: 1.90 cm MV A velocity: 92.20 cm/s MV E/A ratio:  0.85 Fransico Him MD Electronically signed by Fransico Him MD Signature Date/Time: 04/26/2022/4:43:50 PM    Final    US Venous Img Lower Bilateral (DVT)  Result Date: 04/26/2022 CLINICAL DATA:  Bilateral lower extremity edema EXAM: BILATERAL LOWER EXTREMITY VENOUS DOPPLER ULTRASOUND TECHNIQUE: Gray-scale sonography with graded compression, as well as color Doppler and duplex ultrasound were performed  to evaluate the lower extremity deep venous systems from the level of the common femoral vein and including the common femoral, femoral, profunda femoral, popliteal and calf veins including the posterior tibial, peroneal and gastrocnemius veins when visible. The superficial great saphenous vein was also interrogated. Spectral Doppler was utilized to evaluate flow at rest and with distal augmentation maneuvers in the common femoral, femoral and popliteal veins. COMPARISON:  None Available. FINDINGS: RIGHT LOWER EXTREMITY Common Femoral Vein: No evidence of thrombus. Normal compressibility, respiratory phasicity and response to augmentation. Saphenofemoral Junction: No evidence of thrombus. Normal compressibility and flow on color Doppler imaging. Profunda Femoral Vein: No evidence of thrombus. Normal compressibility and flow on color Doppler imaging. Femoral Vein: No evidence of thrombus. Normal compressibility, respiratory phasicity and response to augmentation. Popliteal Vein: No evidence of thrombus. Normal compressibility, respiratory phasicity and response to augmentation. Calf Veins: No evidence of thrombus. Normal compressibility and flow on color Doppler imaging. Superficial Great Saphenous Vein: No evidence of thrombus. Normal compressibility. Venous Reflux:  None. Other  Findings:  None. LEFT LOWER EXTREMITY Common Femoral Vein: No evidence of thrombus. Normal compressibility, respiratory phasicity and response to augmentation. Saphenofemoral Junction: No evidence of thrombus. Normal compressibility and flow on color Doppler imaging. Profunda Femoral Vein: No evidence of thrombus. Normal compressibility and flow on color Doppler imaging. Femoral Vein: No evidence of thrombus. Normal compressibility, respiratory phasicity and response to augmentation. Popliteal Vein: No evidence of thrombus. Normal compressibility, respiratory phasicity and response to augmentation. Calf Veins: No evidence of thrombus. Normal compressibility and flow on color Doppler imaging. Superficial Great Saphenous Vein: No evidence of thrombus. Normal compressibility. Venous Reflux:  None. Other Findings:  None. IMPRESSION: No evidence of deep venous thrombosis in either lower extremity. Electronically Signed   By: Jacqulynn Cadet M.D.   On: 04/26/2022 06:09   MR BRAIN WO CONTRAST  Result Date: 04/26/2022 CLINICAL DATA:  Initial evaluation for acute TIA. EXAM: MRI HEAD WITHOUT CONTRAST TECHNIQUE: Multiplanar, multiecho pulse sequences of the brain and surrounding structures were obtained without intravenous contrast. COMPARISON:  Prior CT from earlier the same day. FINDINGS: Brain: Cerebral volume within normal limits. Mild chronic microvascular ischemic disease for age. Small remote right parietal cortical infarct. Tiny remote left cerebellar infarct noted. 5 mm focus of restricted diffusion involving the right parietal lobe, postcentral gyrus, consistent with a tiny acute ischemic infarct (series 9, image 34). This is immediately adjacent to the underlying chronic right parietal infarct. No associated hemorrhage or mass effect. No other evidence for acute or subacute ischemia. No acute intracranial hemorrhage. Few punctate chronic micro hemorrhages noted within the right cerebral hemisphere. No mass  lesion, midline shift or mass effect. No hydrocephalus or extra-axial fluid collection. Pituitary gland and suprasellar region within normal limits. Vascular: Major intracranial vascular flow voids are maintained. Skull and upper cervical spine: Craniocervical junction level limits. Bone marrow signal intensity normal. No scalp soft tissue abnormality. Sinuses/Orbits: Globes and orbital soft tissues within normal limits. Mild scattered mucosal thickening noted about the ethmoidal air cells. Paranasal sinuses are otherwise clear. No mastoid effusion. Other: None. IMPRESSION: 1. 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 2. 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 Electronically Signed   By: Jeannine Boga M.D.   On: 04/26/2022 00:30   CT HEAD WO CONTRAST (5MM)  Result Date: 04/25/2022 CLINICAL DATA:  Syncope/presyncope, cerebrovascular cause suspected EXAM: CT HEAD WITHOUT CONTRAST TECHNIQUE: Contiguous axial images were obtained from the  base of the skull through the vertex without intravenous contrast. RADIATION DOSE REDUCTION: This exam was performed according to the departmental dose-optimization program which includes automated exposure control, adjustment of the mA and/or kV according to patient size and/or use of iterative reconstruction technique. COMPARISON:  06/26/2020 FINDINGS: Brain: Encephalomalacia in the right parietal lobe is new from prior exam, suggestive of interval ischemia. No acute intracranial hemorrhage. Stable degree of atrophy and periventricular chronic small vessel ischemia. 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 No evidence of territorial ischemia. No subdural or extra-axial collection. No midline shift or mass lesion/mass effect. Vascular: No hyperdense vessel or unexpected calcification. Skull: No fracture or focal lesion. Sinuses/Orbits: Scattered mucosal thickening of ethmoid air  cells. Decreased secretions in right side of sphenoid sinus. Other: None. IMPRESSION: 1. No acute intracranial abnormality. 2. 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 3. Stable atrophy and chronic small vessel ischemia. Electronically Signed   By: Keith Rake M.D.   On: 04/25/2022 22:38   DG Chest Portable 1 View  Result Date: 04/25/2022 CLINICAL DATA:  Syncope.  Eval for infiltrate EXAM: PORTABLE CHEST 1 VIEW COMPARISON:  06/26/2020 FINDINGS: Reticulonodular opacities greatest in the right lung are similar to slightly increased from 06/26/2020. Question trace right pleural effusion or pleural thickening. No pneumothorax. Stable cardiomediastinal silhouette. No acute osseous abnormality. Left chest wall Port-A-Cath tip in the low SVC. Remote right rib fracture. IMPRESSION: Reticulonodular opacities in the right lung are similar to slightly increased from 06/26/2020 and suggestive of bronchopneumonia. Electronically Signed   By: Placido Sou M.D.   On: 04/25/2022 22:13    Microbiology: Results for orders placed or performed during the hospital encounter of 07/04/21  MRSA Next Gen by PCR, Nasal     Status: None   Collection Time: 07/04/21 11:54 AM   Specimen: Nasal Mucosa; Nasal Swab  Result Value Ref Range Status   MRSA by PCR Next Gen NOT DETECTED NOT DETECTED Final    Comment: (NOTE) The GeneXpert MRSA Assay (FDA approved for NASAL specimens only), is one component of a comprehensive MRSA colonization surveillance program. It is not intended to diagnose MRSA infection nor to guide or monitor treatment for MRSA infections. Test performance is not FDA approved in patients less than 36 years old. Performed at [facility], [address] [place], [place] [address]     Labs: CBC: Recent Labs  Lab 04/25/22 2114 04/26/22 0355  WBC 9.9 9.5  HGB 11.8* 11.3*  HCT 41.4 37.8*  MCV 82.1 79.4*  PLT 252  390   Basic Metabolic Panel: Recent Labs  Lab 04/25/22 2114 04/26/22 0355  NA 138 141  K 4.1 4.2  CL 107 108  CO2 18* 26  GLUCOSE 152* 120*  BUN 18 16  CREATININE 1.24 1.05  CALCIUM 8.1* 8.1*   Liver Function Tests: Recent Labs  Lab 04/26/22 0032  AST 19  ALT 15  ALKPHOS 77  BILITOT 0.6  PROT 7.0  ALBUMIN 3.9   CBG: Recent Labs  Lab 04/26/22 0045 04/26/22 0832 04/26/22 1803  GLUCAP 123* 125* 99    Discharge time spent: greater than 30 minutes.  This record has been created using Systems analyst. Errors have been sought and corrected,but may not always be located. Such creation errors do not reflect on the standard of care.   Signed: Lorella Nimrod, MD [facility] 04/26/2022

## 2022-04-26 NOTE — Assessment & Plan Note (Signed)
This is a chronic issue, outpatient referral

## 2022-04-26 NOTE — Assessment & Plan Note (Signed)
Maintain on telemetry, check echo, check serum glucose.

## 2022-04-26 NOTE — Assessment & Plan Note (Signed)
Anemia workup ordered I will send outpatient referral to hematology

## 2022-04-26 NOTE — Consult Note (Signed)
Neurology Consultation Reason for Consult: Stroke Referring Physician: Latina Craver  CC: Loss of consciousness  History is obtained from: Patient  HPI: Blake [name] is a 77 y.o. male who was in his normal state of health yesterday when he had a sudden onset of loss of consciousness.  He had been having pain, and then felt lightheaded and slumped into a chair.  He was held upright by bystanders in the chair to prevent him from falling down.  On EMS arrival his blood pressure was 60/40.  Loss of consciousness lasted for approximately 5 minutes.  He had loss of urinary continence.  After he was laid down by the EMS, he came to and states that he came to relatively abruptly without confusion.  He had just started diltiazem a few days prior to the event.  He has a history of racing heartbeats, but was bradycardic on arrival.  As part of his workup he did have an MRI of his brain  Past Medical History:  Diagnosis Date   Arthritis    Back pain    with leg pain   Basal cell carcinoma 09/04/2009   R cheek 5.5 cm ant to earlobe - 12/26/2009 excision   Basal cell carcinoma 09/04/2009   R ant nasal alar rim   Basal cell carcinoma 07/31/2014   R mid brow   Basal cell carcinoma 07/30/2016   R ant nasal alar rim at ant edge of BCC scar   Basal cell carcinoma 11/02/2019   L zygoma    Basal cell carcinoma 07/22/2021   left sideburn area, EDC   Basal cell carcinoma 01/02/2022   Right posterior shoulder, EDC   Basal cell carcinoma 01/02/2022   R posterior shoulder, EDC   Basal cell carcinoma 01/02/2022   Right Supraclavicular Base of Neck, EDC   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   s/p chemoradiation   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, bilateral    Dyspnea    Dysrhythmia    Esophageal cancer ([place]) 2021   GERD (gastroesophageal reflux disease)    Hyperlipidemia    Hypertension    Numbness and tingling of both lower extremities    with positioning   Paroxysmal atrial  fibrillation ([place])    Port-A-Cath in place 2006   Prostate enlargement    Squamous cell carcinoma of skin 12/14/2019   L pretibial - ED&C    T2DM (type 2 diabetes mellitus) ([place])      Family History  Problem Relation Age of Onset   Diabetes Mother    Congestive Heart Failure Mother    Breast cancer Mother    Lung cancer Father      Social History:  reports that he quit smoking about 28 years ago. His smoking use included cigarettes. He has a 30.00 pack-year smoking history. He has never used smokeless tobacco. He reports that he does not currently use alcohol. He reports that he does not use drugs.   Exam: Current vital signs: BP (!) 149/64 (BP Location: Right Arm) Comment: Notifieed RN  Pulse 68   Temp 98.1 F (36.7 C) (Oral)   Resp 17   Wt 70.3 kg   SpO2 97%   BMI 25.79 kg/m  Vital signs in last 24 hours: Temp:  [97.9 F (36.6 C)-98.8 F (37.1 C)] 98.1 F (36.7 C) (01/27 0916) Pulse Rate:  [57-69] 68 (01/27 0916) Resp:  [17-20] 17 (01/27 0916) BP: (122-167)/(57-88) 149/64 (01/27 0916) SpO2:  [96 %-100 %] 97 % (01/27  0569) Weight:  [70.3 kg] 70.3 kg (01/26 2111)   Physical Exam  Appears well-developed and well-nourished.   Neuro: Mental Status: Patient is awake, alert, oriented to person, place, month, year, and situation. Patient is able to give a clear and coherent history. No signs of aphasia or neglect Cranial Nerves: II: Visual Fields are full. Pupils are equal, round, and reactive to light.   III,IV, VI: EOMI without ptosis or diploplia.  V: Facial sensation is symmetric to temperature VII: Facial movement is symmetric.  VIII: hearing is intact to voice X: Uvula elevates symmetrically XI: Shoulder shrug is symmetric. XII: tongue is midline without atrophy or fasciculations.  Motor: Tone is normal. Bulk is normal. 5/5 strength was present in all four extremities.  Sensory: Sensation is symmetric to light touch and temperature in the arms and  legs. With his eyes closed he has mild difficulty touching his nose with his finger (?  Mild proprioceptive loss) bilaterally Cerebellar: No ataxia  I have reviewed labs in epic and the results pertinent to this consultation are: B12 137  I have reviewed the images obtained: MRI brain-peripherally based small cortical infarct  Impression: 77 year old male with small cortical infarct following syncopal episode.  With his pain prior to the episode, my suspicion is that he had a pain induced vagal response coupled with his new antihypertensive/nodal blocking agents likely was the cause of his hypotension seen by EMS.  His prolonged unconsciousness could be explained by the fact that he was suspended upright by bystanders.  The infarct could be a small cardiac embolus, but my suspicion is it represents a small area of decompensated small vessel disease that was tenuous prior to the hypotensive episode and resulted in injury with decreased perfusion.  That being said, given the location we cannot rule out small embolus and I would favor echocardiogram as well as prolonged cardiac monitoring with Zio patch (especially given his history of palpitations).  Recommendations: 1) aspirin and Plavix(on dual antiplatelet therapy at home, only needs dual therapy from a neurological perspective for 3 weeks, but okay to continue if indicated for other reasons) 2) continue high intensity statin with goal LDL less than 70(on Lipitor 80 mg nightly at home) 3) echo, telemetry, if no explanation would recommend longer cardiac monitoring 4) PT, OT, ST 5) could consider outpatient EEG, though this is relatively low yield given his hypotension as an explanation for his loss of consciousness 6) neurology will be available as needed.   Roland Rack, MD [facility] [phone]  If 7pm- 7am, please page neurology on call as listed in [place].

## 2022-04-26 NOTE — Assessment & Plan Note (Signed)
Check urine sodium potassium chloride and pH.  Start oral sodium bicarbonate

## 2022-04-26 NOTE — Assessment & Plan Note (Signed)
Interested in getting CAT scan with contrast again.  Check lower extremity Dopplers

## 2022-04-26 NOTE — Assessment & Plan Note (Signed)
Follow-up MRI, continue with antiplatelet therapy

## 2022-04-26 NOTE — Assessment & Plan Note (Signed)
It is unclear if patient had any paresthesia related to these loss of consciousness episode.  Patient reported some paresthesia of the left arm to ER attending, however denied any to me.  And subsequently said that Blake [name] tends to have paresthesias related to postural changes due to his back issues.  Regardless we will get an EEG and proceed from there

## 2022-04-26 NOTE — Hospital Course (Addendum)
Taken from H&P.   Blake [name] is a 77 y.o. male with medical history significant of right carotid artery stenting and starting of dilitazem recently presented with concern of loss of consciousness. Patient was in his usual state of health till approximately 8 PM this evening when he was at a get together and had a couple of drinks of beer already. At the time patient reports that he had an exacerbation of his chronic low back pain that he often gets while prolonged standing. Therefore patient was able to help himself to a chair and sat down. And immediately after patient reports feeling "unwell "patient is not able to further clarify this. Patient does not report any focal weakness or focal sensory symptoms no palpitations no chest pain no trouble breathing. Patient subsequently lost consciousness.  Patient reports waking up in the EMS ambulance. Recalls being sweaty. And he had wet himself.  Patient has since then been pretty much asymptomatic although he is still feeling weak.  No chest pain, shortness of breath or focal weakness.  On arrival patient with mild sinus bradycardia otherwise unremarkable vitals.  Labs pertinent for hemoglobin of 11.8, D-dimer elevated at 0.77, rest of the labs mostly unremarkable.  CT head with no acute intracranial abnormality.  Encephalomalacia in the right parietal lobe is new from 2022 exam suggestive of interval but chronic ischemia.  Chest x-ray with reticulonodular opacities in the lateral right lung are similar to slightly increased from prior exam as suggestive of bronchopneumonia. Home diltiazem was held.  1/27: Vitals stable.  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  Lower extremity venous Doppler was negative for DVT.  Anemia panel with B12 low at 137, and iron deficiency.  Patient was given 1 dose of IM B12 and started on p.o. supplement along with some iron supplements.  PCP should be able to follow-up and  advise further.  Patient appears to be at baseline and would like to go home.  Neurology was also consulted for concern of small infarct on MRI.  Most likely not the cause of this syncopal episode.  It appears to be a vasovagal syncopal episode which triggered by lower back pain and standing for prolonged.  Of time.  mild sinus bradycardia can be contributory, patient was recently started on diltiazem by his doctor along with high-dose of metoprolol which he was taking before for concern of intermittent tachycardia.  Patient denies any history of A-fib.  We arranged ZIO monitor to be mailed to his house and he will follow-up with cardiology for results. Neurology was also advising to get an EEG which can be done as an outpatient.  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  Irregular stenosis of proximal left ICA with at least 60% stenosis.  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  Mild stenosis of less than 50% at the origin of right vertebral artery.  Multiple pulmonary nodules are present within the right lung, not significantly changed from the prior exam. Echocardiogram was without any significant abnormality.  Patient has his vascular surgery follow-up appointment next Monday which he will continue so they can review his images.  Patient will continue with rest of his home medications except advised to hold diltiazem and need to have a close follow-up with his providers for further recommendations.

## 2022-04-26 NOTE — Progress Notes (Signed)
1045 black fit bit returned to pt in room 133.

## 2022-04-26 NOTE — Assessment & Plan Note (Signed)
Hold diltiazem which was recently started

## 2022-04-26 NOTE — Plan of Care (Signed)

## 2022-04-27 ENCOUNTER — Other Ambulatory Visit: Payer: Self-pay

## 2022-04-27 LAB — HAPTOGLOBIN: Haptoglobin: 103 mg/dL (ref 34–355)

## 2022-04-28 ENCOUNTER — Other Ambulatory Visit: Payer: Self-pay

## 2022-04-28 ENCOUNTER — Telehealth: Payer: Self-pay | Admitting: *Deleted

## 2022-04-28 DIAGNOSIS — R197 Diarrhea, unspecified: Secondary | ICD-10-CM | POA: Diagnosis not present

## 2022-04-28 DIAGNOSIS — R194 Change in bowel habit: Secondary | ICD-10-CM | POA: Diagnosis not present

## 2022-04-28 NOTE — Patient Outreach (Signed)
  Care Coordination   Initial Visit Note   04/28/2022 Name: Blake [name] MRN: [id number] DOB: April 08, 1945  Blake [name] is a 77 y.o. year old male who sees Sparks, Leonie Douglas, MD for primary care. I spoke with  Blake [name] by phone today.  What matters to the patients health and wellness today?  State he is doing well, declines services.     SDOH assessments and interventions completed:  No     Care Coordination Interventions:  No, not indicated   Follow up plan: No further intervention required.   Encounter Outcome:  Pt. Refused   Valente David, RN, MSN, [place] Care Management Care Management Coordinator [phone]

## 2022-04-29 ENCOUNTER — Other Ambulatory Visit: Payer: Self-pay

## 2022-04-30 ENCOUNTER — Telehealth: Payer: Self-pay | Admitting: *Deleted

## 2022-04-30 ENCOUNTER — Telehealth: Payer: Self-pay | Admitting: Oncology

## 2022-04-30 ENCOUNTER — Other Ambulatory Visit: Payer: Self-pay

## 2022-04-30 DIAGNOSIS — C159 Malignant neoplasm of esophagus, unspecified: Secondary | ICD-10-CM

## 2022-04-30 NOTE — Telephone Encounter (Signed)
Called [facility] to advise monitor requested and we were calling to make sure one is sent to him. Amy said she will send that to nurse. She stated that they will call and have him come in to have that placed.

## 2022-04-30 NOTE — Telephone Encounter (Signed)
Patient sent a mychart message that the infusion appointment for 2/21 should not be for him. He is scheduled for [place]. He would like a call to clarify this appointment- Please call and then advise on scheduling.   Thank you

## 2022-04-30 NOTE — Telephone Encounter (Signed)
Will you add lab to port flush on 3/4 please. Thanks

## 2022-04-30 NOTE — Telephone Encounter (Signed)
-----  Message from Gerrie Nordmann, NP sent at 04/30/2022  8:41 AM EST ----- Regarding: FW: hospital follow up Please make sure this patient has been sent to [facility] and they are aware he needs a monitor please and thank you ----- Message ----- From: Shellia Cleverly, RN Sent: 04/30/2022   8:38 AM EST To: Gerrie Nordmann, NP Subject: RE: hospital follow up                         OK - perfect!    Thank you  Have a great day,  Pam   ----- Message ----- From: Gerrie Nordmann, NP Sent: 04/29/2022   7:38 AM EST To: Shellia Cleverly, RN Subject: RE: hospital follow up                         So this is not a patient that I saw. I had a message from Dr. Radford Pax to have him scheduled for follow-up and to set up a monitor. I can check with triage here to see if they sent him one but sounds like to me that [facility] needs to arrange all of that so the results go to them.  Thanks, Sheri ----- Message ----- From: Shellia Cleverly, RN Sent: 04/28/2022   4:39 PM EST To: Gerrie Nordmann, NP Subject: FW: hospital follow up                         Just to follow up.  I called the patient.  He does not want to be scheduled in [place].  He will continue to be seen by cardiology in [place].  He is being scheduled with Dr Isaias Cowman.  He has not received a zio monitor in the mail however, [place].  Thanks,  Pam   ----- Message ----- From: Bernestine Amass, RN Sent: 04/28/2022  12:37 PM EST To: Shellia Cleverly, RN Subject: FW: hospital follow up                          ----- Message ----- From: Gerrie Nordmann, NP Sent: 04/26/2022   3:58 PM EST To: [facility] Scheduling; [facility] Triage Subject: hospital follow up                             Please schedule appointment with Dr Marlou Porch in the next 2 weeks. He will also need a Zio XT monitor to wear for 2 weeks to r/o atrial fibrillation s/p CVA.  Thank you, Barbera Setters

## 2022-05-01 ENCOUNTER — Encounter (INDEPENDENT_AMBULATORY_CARE_PROVIDER_SITE_OTHER): Payer: Self-pay | Admitting: Nurse Practitioner

## 2022-05-01 ENCOUNTER — Ambulatory Visit (INDEPENDENT_AMBULATORY_CARE_PROVIDER_SITE_OTHER): Payer: Medicare HMO

## 2022-05-01 ENCOUNTER — Ambulatory Visit (INDEPENDENT_AMBULATORY_CARE_PROVIDER_SITE_OTHER): Payer: Medicare HMO | Admitting: Nurse Practitioner

## 2022-05-01 ENCOUNTER — Encounter (INDEPENDENT_AMBULATORY_CARE_PROVIDER_SITE_OTHER): Payer: Self-pay

## 2022-05-01 VITALS — BP 166/71 | HR 65 | Ht 65.0 in | Wt 155.0 lb

## 2022-05-01 DIAGNOSIS — I10 Essential (primary) hypertension: Secondary | ICD-10-CM | POA: Diagnosis not present

## 2022-05-01 DIAGNOSIS — I6523 Occlusion and stenosis of bilateral carotid arteries: Secondary | ICD-10-CM

## 2022-05-01 DIAGNOSIS — E119 Type 2 diabetes mellitus without complications: Secondary | ICD-10-CM | POA: Diagnosis not present

## 2022-05-02 ENCOUNTER — Other Ambulatory Visit: Payer: Self-pay

## 2022-05-02 DIAGNOSIS — R55 Syncope and collapse: Secondary | ICD-10-CM | POA: Diagnosis not present

## 2022-05-02 DIAGNOSIS — Z8679 Personal history of other diseases of the circulatory system: Secondary | ICD-10-CM | POA: Diagnosis not present

## 2022-05-02 DIAGNOSIS — Z8673 Personal history of transient ischemic attack (TIA), and cerebral infarction without residual deficits: Secondary | ICD-10-CM | POA: Diagnosis not present

## 2022-05-19 ENCOUNTER — Encounter (INDEPENDENT_AMBULATORY_CARE_PROVIDER_SITE_OTHER): Payer: Self-pay | Admitting: Nurse Practitioner

## 2022-05-19 NOTE — Progress Notes (Signed)
Subjective:    Patient ID: Blake [name], male    DOB: 1945-09-17, 77 y.o.   MRN: [id number] Chief Complaint  Patient presents with   Follow-up    Blake [name] is a 77 year old male who presents today for 27-monthfollow-up following right ICA stent placement.  He has been doing well overall despite having an episode in January concerning for possible stroke.  Syncopal event and had numbness and tingling in his left arm.  At this time he underwent a CT showed high-grade stenosis of the proximal right ICA immediately distal to the stent.  There is also at least a 60% stenosis of the left ICA which would be within the realm consistent with the previous studies.  Currently has no residual symptoms.  He denies any dizziness or syncopal episodes at that time.    Review of Systems  Neurological:  Positive for syncope and numbness.  All other systems reviewed and are negative.      Objective:   Physical Exam Vitals reviewed.  HENT:     Head: Normocephalic.  Neck:     Vascular: No carotid bruit.  Cardiovascular:     Rate and Rhythm: Normal rate.     Pulses: Normal pulses.  Pulmonary:     Effort: Pulmonary effort is normal.  Skin:    General: Skin is warm and dry.  Neurological:     Mental Status: He is alert and oriented to person, place, and time.  Psychiatric:        Mood and Affect: Mood normal.        Behavior: Behavior normal.        Thought Content: Thought content normal.        Judgment: Judgment normal.     BP (!) 166/71   Pulse 65   Ht 5' 5"$  (1.651 m)   Wt 155 lb (70.3 kg)   BMI 25.79 kg/m   Past Medical History:  Diagnosis Date   Arthritis    Back pain    with leg pain   Basal cell carcinoma 09/04/2009   R cheek 5.5 cm ant to earlobe - 12/26/2009 excision   Basal cell carcinoma 09/04/2009   R ant nasal alar rim   Basal cell carcinoma 07/31/2014   R mid brow   Basal cell carcinoma 07/30/2016   R ant nasal alar rim at ant edge of BCC scar   Basal cell  carcinoma 11/02/2019   L zygoma    Basal cell carcinoma 07/22/2021   left sideburn area, EDC   Basal cell carcinoma 01/02/2022   Right posterior shoulder, EDC   Basal cell carcinoma 01/02/2022   R posterior shoulder, EDC   Basal cell carcinoma 01/02/2022   Right Supraclavicular Base of Neck, [facility]   [place]   s/p chemoradiation   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, bilateral    Dyspnea    Dysrhythmia    Esophageal cancer [place] [place]   GERD (gastroesophageal reflux disease)    Hyperlipidemia    Hypertension    Numbness and tingling of both lower extremities    with positioning   Paroxysmal atrial fibrillation ([facility]    Port-A-Cath in place 2006   Prostate enlargement    Squamous cell carcinoma of skin 12/14/2019   L pretibial - ED&C    T2DM (type 2 diabetes mellitus) ([facility]     Social History   Socioeconomic History   Marital status: Widowed    Spouse name:  Not on file   Number of children: 1   Years of education: Not on file   Highest education level: Not on file  Occupational History   Not on file  Tobacco Use   Smoking status: Former    Packs/day: 1.00    Years: 30.00    Total pack years: 30.00    Types: Cigarettes    Quit date: 02/18/1994    Years since quitting: 28.2   Smokeless tobacco: Never  Vaping Use   Vaping Use: Never used  Substance and Sexual Activity   Alcohol use: Not Currently   Drug use: No   Sexual activity: Yes  Other Topics Concern   Not on file  Social History Narrative   Lives by himself. Friends in the area. Son Gerald Stabs lives in [place].    Social Determinants of Health   Financial Resource Strain: Not on file  Food Insecurity: Not on file  Transportation Needs: Not on file  Physical Activity: Not on file  Stress: Not on file  Social Connections: Not on file  Intimate Partner Violence: Not on file    Past Surgical History:  Procedure Laterality Date   ABDOMINAL SURGERY     CAROTID  PTA/STENT INTERVENTION Right 07/04/2021   Procedure: CAROTID PTA/STENT INTERVENTION;  Surgeon: Algernon Huxley, MD;  Location: [facility];  Service: Cardiovascular;  Laterality: Right;   COLONOSCOPY  12/08/2003   COLONOSCOPY WITH PROPOFOL N/A 03/21/2019   Procedure: COLONOSCOPY WITH PROPOFOL;  Surgeon: Toledo, Benay Pike, MD;  Location: [facility];  Service: Gastroenterology;  Laterality: N/A;   ESOPHAGOGASTRODUODENOSCOPY (EGD) WITH PROPOFOL N/A 03/21/2019   Procedure: ESOPHAGOGASTRODUODENOSCOPY (EGD) WITH PROPOFOL;  Surgeon: Toledo, Benay Pike, MD;  Location: [facility];  Service: Gastroenterology;  Laterality: N/A;   esophogeal cancer     JOINT REPLACEMENT Right    total hip   LUMBAR LAMINECTOMY/DECOMPRESSION MICRODISCECTOMY N/A 03/03/2018   Procedure: LUMBAR LAMINECTOMY/DECOMPRESSION MICRODISCECTOMY 1 LEVEL-L3-4,L4-5;  Surgeon: Meade Maw, MD;  Location: [facility];  Service: Neurosurgery;  Laterality: N/A;   LUMBAR LAMINECTOMY/DECOMPRESSION MICRODISCECTOMY N/A 02/24/2020   Procedure: LEFT L3-4 MICRODISCECTOMY, L4-5 DECOMPRESSION;  Surgeon: Meade Maw, MD;  Location: [facility];  Service: Neurosurgery;  Laterality: N/A;   MAXIMUM ACCESS (MAS) TRANSFORAMINAL LUMBAR INTERBODY FUSION (TLIF) 1 LEVEL N/A 07/13/2020   Procedure: OPEN L3-4 TRANSFORAMINAL LUMBAR INTERBODY FUSION (TLIF);  Surgeon: Meade Maw, MD;  Location: [facility];  Service: Neurosurgery;  Laterality: N/A;   PARTIAL HIP ARTHROPLASTY Right 2014   PORT-A-CATH REMOVAL     PORTACATH PLACEMENT Left 01/12/2020   Procedure: INSERTION PORT-A-CATH, with ultrasound fluoroscopy;  Surgeon: Nestor Lewandowsky, MD;  Location: [facility];  Service: General;  Laterality: Left;   TONGUE BIOPSY  2006   TONSILLECTOMY     TRIGGER FINGER RELEASE Right 2013   UPPER GI ENDOSCOPY  01/25/14   multiple gastric polyps    Family History  Problem Relation Age of Onset   Diabetes Mother    Congestive Heart Failure Mother    Breast  cancer Mother    Lung cancer Father     Allergies  Allergen Reactions   Sulfa Antibiotics Rash       Latest Ref Rng & Units 04/26/2022    3:55 AM 04/25/2022    9:14 PM 12/30/2021    8:33 AM  CBC  WBC 4.0 - 10.5 K/uL 9.5  9.9  8.9   Hemoglobin 13.0 - 17.0 g/dL 11.3  11.8  12.4   Hematocrit 39.0 - 52.0 % 37.8  41.4  40.9   Platelets 150 - 400 K/uL 257  252  319       CMP     Component Value Date/Time   NA 141 04/26/2022 0355   NA 130 (L) 01/26/2013 0505   K 4.2 04/26/2022 0355   K 4.6 01/26/2013 0505   CL 108 04/26/2022 0355   CL 101 01/26/2013 0505   CO2 26 04/26/2022 0355   CO2 26 01/26/2013 0505   GLUCOSE 120 (H) 04/26/2022 0355   GLUCOSE 138 (H) 01/26/2013 0505   BUN 16 04/26/2022 0355   BUN 19 (H) 01/26/2013 0505   CREATININE 1.05 04/26/2022 0355   CREATININE 1.18 01/26/2013 0505   CALCIUM 8.1 (L) 04/26/2022 0355   CALCIUM 8.3 (L) 01/26/2013 0505   PROT 7.0 04/26/2022 0032   PROT 6.8 08/11/2011 1440   ALBUMIN 3.9 04/26/2022 0032   ALBUMIN 3.8 08/11/2011 1440   AST 19 04/26/2022 0032   AST 18 08/11/2011 1440   ALT 15 04/26/2022 0032   ALT 35 08/11/2011 1440   ALKPHOS 77 04/26/2022 0032   ALKPHOS 75 08/11/2011 1440   BILITOT 0.6 04/26/2022 0032   BILITOT 0.4 08/11/2011 1440   GFRNONAA >60 04/26/2022 0355   GFRNONAA >60 01/26/2013 0505   GFRAA >60 12/28/2019 1328   GFRAA >60 01/26/2013 0505     No results found.     Assessment & Plan:   1. Bilateral carotid artery stenosis The CTA results show high-grade stenosis of the right ICA distal to the stent.  The patient has had radiation treatments in his neck which do place him at greater risk for stent restenosis.  Based on the CT results I recommended that the patient undergo angiogram for further evaluation as well as possible treatment however at this time the patient does not wish to move forward with any intervention.  We will maintain a close follow-up and have the patient return in 3 months with  noninvasive studies.  Patient is to continue with DAPT  2. Essential (primary) hypertension Continue antihypertensive medications as already ordered, these medications have been reviewed and there are no changes at this time.  3. Type 2 diabetes mellitus without complication, unspecified whether long term insulin use ([place]) Continue hypoglycemic medications as already ordered, these medications have been reviewed and there are no changes at this time.  Hgb A1C to be monitored as already arranged by primary service   Current Outpatient Medications on File Prior to Visit  Medication Sig Dispense Refill   aspirin 81 MG EC tablet Take 1 tablet (81 mg total) by mouth daily at 6 (six) AM. Swallow whole. 30 tablet 11   azelastine (ASTELIN) 0.1 % nasal spray Place 2 sprays into both nostrils 2 (two) times daily. Use in each nostril as directed 30 mL 2   clopidogrel (PLAVIX) 75 MG tablet Take 1 tablet (75 mg total) by mouth daily. 30 tablet 6   cyanocobalamin 1000 MCG tablet Take 1 tablet (1,000 mcg total) by mouth daily. 90 tablet 0   diltiazem (CARDIZEM CD) 180 MG 24 hr capsule Take 1 capsule (180 mg total) by mouth daily. Please hold until you see your cardiologist 90 capsule 3   erythromycin ophthalmic ointment Place 1 Application into the right eye 3 (three) times daily for 10 days. 3.5 g 0   ferrous sulfate 325 (65 FE) MG tablet Take 1 tablet (325 mg total) by mouth 2 (two) times daily with a meal. 60 tablet 3   ibuprofen (ADVIL)  800 MG tablet Take 1 tablet (800 mg total) by mouth every 8 (eight) hours as needed for pain. 60 tablet 1   lisinopril (ZESTRIL) 10 MG tablet TAKE 1 TABLET (10 MG TOTAL) BY MOUTH DAILY. 30 tablet 3   methocarbamol (ROBAXIN) 500 MG tablet Take 1 tablet (500 mg total) by mouth every 6 (six) hours as needed. 90 tablet 1   metoprolol succinate (TOPROL-XL) 100 MG 24 hr tablet Take 2 tablets (200 mg total) by mouth once daily 180 tablet 1   mirtazapine (REMERON) 15 MG tablet  Take 1 tablet (15 mg total) by mouth nightly 30 tablet 11   omeprazole (PRILOSEC) 40 MG capsule Take 1 capsule (40 mg total) by mouth once daily 90 capsule 3   rosuvastatin (CRESTOR) 10 MG tablet Take 1 tablet (10 mg total) by mouth once daily 90 tablet 3   tamsulosin (FLOMAX) 0.4 MG CAPS capsule TAKE 1 CAPSULE BY MOUTH ONCE DAILY. TAKE 30 MINUTES AFTER SAME MEAL EACH DAY. 90 capsule 3   Tiotropium Bromide Monohydrate (SPIRIVA RESPIMAT) 2.5 MCG/ACT AERS Inhale 2 puffs into the lungs daily. 4 g 1   [DISCONTINUED] prochlorperazine (COMPAZINE) 10 MG tablet Take 1 tablet (10 mg total) by mouth every 6 (six) hours as needed (Nausea or vomiting). 30 tablet 1   No current facility-administered medications on file prior to visit.    There are no Patient Instructions on file for this visit. No follow-ups on file.   Kris Hartmann, NP

## 2022-05-21 ENCOUNTER — Ambulatory Visit: Payer: Medicare HMO

## 2022-05-21 ENCOUNTER — Ambulatory Visit: Payer: Medicare HMO | Admitting: Oncology

## 2022-05-29 ENCOUNTER — Other Ambulatory Visit: Payer: Self-pay | Admitting: Oncology

## 2022-05-29 DIAGNOSIS — D509 Iron deficiency anemia, unspecified: Secondary | ICD-10-CM | POA: Insufficient documentation

## 2022-05-29 DIAGNOSIS — D500 Iron deficiency anemia secondary to blood loss (chronic): Secondary | ICD-10-CM

## 2022-06-02 ENCOUNTER — Encounter: Payer: Self-pay | Admitting: Oncology

## 2022-06-02 ENCOUNTER — Inpatient Hospital Stay: Payer: Medicare HMO | Attending: Oncology

## 2022-06-02 ENCOUNTER — Other Ambulatory Visit: Payer: Self-pay

## 2022-06-02 ENCOUNTER — Inpatient Hospital Stay: Payer: Medicare HMO

## 2022-06-02 ENCOUNTER — Inpatient Hospital Stay (HOSPITAL_BASED_OUTPATIENT_CLINIC_OR_DEPARTMENT_OTHER): Payer: Medicare HMO | Admitting: Oncology

## 2022-06-02 DIAGNOSIS — Z452 Encounter for adjustment and management of vascular access device: Secondary | ICD-10-CM | POA: Insufficient documentation

## 2022-06-02 DIAGNOSIS — D500 Iron deficiency anemia secondary to blood loss (chronic): Secondary | ICD-10-CM

## 2022-06-02 DIAGNOSIS — Z8501 Personal history of malignant neoplasm of esophagus: Secondary | ICD-10-CM | POA: Diagnosis not present

## 2022-06-02 DIAGNOSIS — C155 Malignant neoplasm of lower third of esophagus: Secondary | ICD-10-CM | POA: Diagnosis not present

## 2022-06-02 DIAGNOSIS — D509 Iron deficiency anemia, unspecified: Secondary | ICD-10-CM | POA: Diagnosis not present

## 2022-06-02 DIAGNOSIS — Z08 Encounter for follow-up examination after completed treatment for malignant neoplasm: Secondary | ICD-10-CM | POA: Diagnosis not present

## 2022-06-02 DIAGNOSIS — R918 Other nonspecific abnormal finding of lung field: Secondary | ICD-10-CM | POA: Diagnosis not present

## 2022-06-02 DIAGNOSIS — C159 Malignant neoplasm of esophagus, unspecified: Secondary | ICD-10-CM

## 2022-06-02 LAB — COMPREHENSIVE METABOLIC PANEL
ALT: 23 U/L (ref 0–44)
AST: 26 U/L (ref 15–41)
Albumin: 3.7 g/dL (ref 3.5–5.0)
Alkaline Phosphatase: 70 U/L (ref 38–126)
Anion gap: 5 (ref 5–15)
BUN: 19 mg/dL (ref 8–23)
CO2: 26 mmol/L (ref 22–32)
Calcium: 8.4 mg/dL — ABNORMAL LOW (ref 8.9–10.3)
Chloride: 108 mmol/L (ref 98–111)
Creatinine, Ser: 1.06 mg/dL (ref 0.61–1.24)
GFR, Estimated: >60 mL/min (ref >60)
Glucose, Bld: 145 mg/dL — ABNORMAL HIGH (ref 70–99)
Potassium: 4.5 mmol/L (ref 3.5–5.1)
Sodium: 139 mmol/L (ref 135–145)
Total Bilirubin: 0.5 mg/dL (ref 0.3–1.2)
Total Protein: 6.6 g/dL (ref 6.5–8.1)

## 2022-06-02 LAB — CBC WITH DIFFERENTIAL/PLATELET
Abs Immature Granulocytes: 0.04 10*3/uL (ref 0.00–0.07)
Basophils Absolute: 0 10*3/uL (ref 0.0–0.1)
Basophils Relative: 1 %
Eosinophils Absolute: 0.1 10*3/uL (ref 0.0–0.5)
Eosinophils Relative: 1 %
HCT: 40.5 % (ref 39.0–52.0)
Hemoglobin: 12.1 g/dL — ABNORMAL LOW (ref 13.0–17.0)
Immature Granulocytes: 1 %
Lymphocytes Relative: 10 %
Lymphs Abs: 0.9 10*3/uL (ref 0.7–4.0)
MCH: 24.8 pg — ABNORMAL LOW (ref 26.0–34.0)
MCHC: 29.9 g/dL — ABNORMAL LOW (ref 30.0–36.0)
MCV: 83 fL (ref 80.0–100.0)
Monocytes Absolute: 0.5 10*3/uL (ref 0.1–1.0)
Monocytes Relative: 7 %
Neutro Abs: 6.7 10*3/uL (ref 1.7–7.7)
Neutrophils Relative %: 80 %
Platelets: 259 10*3/uL (ref 150–400)
RBC: 4.88 MIL/uL (ref 4.22–5.81)
RDW: 18.1 % — ABNORMAL HIGH (ref 11.5–15.5)
WBC: 8.3 10*3/uL (ref 4.0–10.5)
nRBC: 0 % (ref 0.0–0.2)

## 2022-06-02 LAB — IRON AND TIBC
Iron: 271 ug/dL — ABNORMAL HIGH (ref 45–182)
Saturation Ratios: 61 % — ABNORMAL HIGH (ref 17.9–39.5)
TIBC: 447 ug/dL (ref 250–450)
UIBC: 176 ug/dL

## 2022-06-02 LAB — FERRITIN: Ferritin: 10 ng/mL — ABNORMAL LOW (ref 24–336)

## 2022-06-02 MED ORDER — LISINOPRIL 10 MG PO TABS
10.0000 mg | ORAL_TABLET | Freq: Every day | ORAL | 3 refills | Status: DC
  Filled 2022-06-02: qty 30, 30d supply, fill #0
  Filled 2022-06-25: qty 30, 30d supply, fill #1
  Filled 2022-07-31: qty 30, 30d supply, fill #2
  Filled 2022-09-13: qty 30, 30d supply, fill #3

## 2022-06-02 MED ORDER — HEPARIN SOD (PORK) LOCK FLUSH 100 UNIT/ML IV SOLN
500.0000 [IU] | Freq: Once | INTRAVENOUS | Status: AC
  Administered 2022-06-02: 500 [IU] via INTRAVENOUS
  Filled 2022-06-02: qty 5

## 2022-06-02 MED FILL — Iron Sucrose Inj 20 MG/ML (Fe Equiv): INTRAVENOUS | Qty: 10 | Status: AC

## 2022-06-02 NOTE — Progress Notes (Signed)
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  Additional chemotherapy was not completed due to multiple hospitalization due to infection and back surgery. Labs are reviewed and discussed with patient Showed no evidence of recurrence. Continue surveillance.  Obtain CT chest abdomen pelvis in April 2024  Hypocalcemia Recommend patient to start calcium and vitamin D supplementation.  Lung nodules Stable.  Recommend patient to follow-up with pulmonology Dr. Halford Chessman  IDA (iron deficiency anemia) Recommend patient to continue oral iron supplementation.  Repeat levels in 6 months.  Consider IV Venofer treatments if no improvement.  No orders of the defined types were placed in this encounter.  Follow-up in 6 months. All questions were answered. The patient knows to call the clinic with any problems, questions or concerns.  Earlie Server, MD, PhD [facility] Hematology Oncology 06/02/2022    HISTORY OF PRESENTING ILLNESS:   BURKLEY WONDRA is a  77 y.o.  male presents for follow-up of esophageal cancer Patient has longstanding Barrett's esophagus. Oncology History  Malignant neoplasm of lower third of esophagus ([place])  07/14/2019 Initial Diagnosis   Patient has longstanding Barrett's esophagus. 03/21/2019 patient underwent surveillance endoscopy which showed esophageal mucosal changes secondary to long segment Barrett's disease present in the  lower third of the esophagus.  Maximal longitudinal extent of these mucosal changes were 7 cm in length.  Mucosa was biopsied with cold forceps for histology in 4 quadrants at the interval of 2 cm in the lower third of esophagus.  2 cm hiatal hernia, gastritis Esophageal biopsy 3 out of the 4 biopsies were positive for intramucosal carcinoma involving Barrett's mucosa with high-grade dysplasia.  1 out of 4 esophagus biopsy showed Barrett's mucosa with erosion, indefinite for dysplasia  06/09/19, EMR at [facility].  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 EMR specimen was received in multiple fragments and margins can not be evaluated.  1 gastric polyps were biopsied which showed fundic gland polyp. Negative for dysplasia   08/08/19, to OR for EGD with sessile tumor from 29-33cm, at least 1.5 cm wide. Pathology with the following: 1) esophagus (30cm) - residual invasive adenocarcinoma, at least invasive to muscularis mucosa and 2) esophagus (30cm) - superficial fragments of highly dysplastic epithelium, focally suspicious for intramucosal carcinoma    09/12/2019 Surgery    At [place], patient underwent esophagectomy with pathological revealed  Negative esophageal margin, gastric margin, esophagogastrectomy, invasive moderately differentiated esophageal adenocarcinoma involved distal esophageal and GE junction, tumor focally invades the submucosa.  Left gastric lymph node x 4 negative, greater curvature lymph nodes 1/3 is positive for 46m metastatic carcinoma.  Periesophageal lymph node x 6, negative, final pathology stage pT1b pn1, Esophageal adenocarcinoma, Stage IIB Lymph node positive diease, no neoadjuvant chemotherapy, recommended by  Dr. UFanny Skatesand was recommended for adjuvant chemotherapy.  Patient prefers to receive treatment closer to home and patient call back to establish care. No radiation was recommended due to the concern of dehiscence. 11/28/2019, patient had a stent  removal.  12/09/2019 Cancer Staging   Staging form: Esophagus - Adenocarcinoma, AJCC 8th Edition - Pathologic stage from 12/09/2019: Stage IIB (pT1b, pN1, cM0, G2) - Signed by Earlie Server, MD on 12/09/2019    11/2019 -  Chemotherapy   September 2021, patient underwent Xeloda 1500 mg twice daily/oxaliplatin.  He reports not tolerating well due to nausea and diarrhea. 12/24/2019 patient's wife was diagnosed with COVID-19 infection and patient was tested negative.  Both patient and wife received monoclonal antibody treatments.  Patient prefers to switch to FOLFOX. He had Mediport placed on 01/12/2020 by Dr. Faith Rogue. Additional chemotherapy was interrupted and delayed due to his aspiration pneumonia, and later due to his preference of proceeding with back surgery first- see below.    01/18/2020 -  Hospital Admission   Patient was advised to go to emergency room and was later admitted for pneumonia treatments.  There was concern of aspiration pneumonia.  Patient was evaluated by speech therapy.  No overt clinical signs of aspiration during p.o. intake.  Cannot rule out possibility of esophageal phase retrograde flow activity patient denies further swallowing evaluation.  Patient was discharged on 01/19/2020.   01/27/2020 Imaging   CT chest with contrast showed extensive nodular airspace process in the right lung with loss of volume and areas of subpleural atelectasis.  Likely polylobar pneumonia with aspiration.  No mediastinal or hilar mass or adenopathy.  Surgical changes   02/24/2020 Surgery   patient had L4-5 lumbar decompression, L3--4 microdiscectomy.  Per note he tolerates procedure very well. djuvant chemotherapy has been delayed significantly due to above reasons. At that point, the benefit is uncertain to resume adjuvant chemotherapy 9 months after original surgery.   04/10/2020 Mammogram   PET scan showed progressive right lung area of dense consolidation and clustering nodularity with  correlated hypermetabolic him.  Favor aspiration/chronic infection.  No signs of metastatic disease.  Decision was made to continue observation    06/26/2020 - 06/27/2020 Hospital Admission   admission due to chest pain. Toponin was negative, CXR negative.CT chest angiogram showed np PE, multifocal pneumonia through out right lung, improved. Increasing moderate left pleural effusion. Unchanged trace right pleural effusion.   07/12/2020 Imaging   CT abdomen pelvis with contrast - no findings of active malignancy. masslike structure in the left upper quadrant represents a confluent region of fat necrosis. Not hypermetabolic on previous PET. Nodular and bandlike opacities at right lung base have improved comparing to 06/26/2020. Substantial narrowing of right lowe lobe tracheobronchial tree. Other chronic findings.   07/16/2020 Surgery   07/16/2020 s/p spondylolisthesis   12/04/2020 Imaging   CT chest abdomen pelvis showed no evidence of new or progressive findings to suggest recurrent or metastatic disease.  Stable volume loss right hemithorax with diffuse micro nodularity in the right lung. ? Atypical infection.  He declined pulmonology work up    06/29/2021 Imaging   06/29/2021, CT chest abdomen pelvis with contrast was reviewed by me and discussed with patient No evidence of recurrent or metastatic disease.  extensive posterior centrilobular and tree-in-bud nodularity throughout the right lung.  With areas of peripheral consolidation.  Unchanged and is consistent with atypical infection   12/30/2021 Imaging   CT chest abdomen pelvis w contrast 1. Status post esophagectomy and gastric pull-through. No evidence of local recurrence or metastatic disease. 2. Numerous solid nodules are seen throughout the right hemithorax,not significantly changed when compared with prior exam and likely due to chronic atypical infection. 3. No evidence of metastatic disease in the abdomen  or pelvis. 4. Aortic  Atherosclerosis (ICD10-I70.0) and Emphysema (ICD10-J43.9    04/26/2022 Imaging   MRI brain without contrast 1. 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 2. 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 Jeb Levering.  His initial PET scan 11/08/2004 showed abnormal localization in a right neck mass possibly a lymph node, slightly asymmetric localization at the base of the tongue.  Pathology is not available in current EMR.    INTERVAL HISTORY TRUEN SCHAUL is a 77 y.o. male who has above history reviewed by me today presents for follow up visit for management of esophageal cancer.  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  He was admitted from 04/25/2022 to 04/26/2022.  Currently patient is taking Plavix and aspirin 81 mg.  He has some residual left finger paresthesia.  Patient reports feeling well.  Manageable GERD symptoms.  Denies any unintentional weight loss, abdominal pain, nausea vomiting.  Review of Systems  Constitutional:  Negative for appetite change, chills, diaphoresis, fatigue, fever and unexpected weight change.  HENT:   Negative for hearing loss, lump/mass, nosebleeds, sore throat and voice change.   Eyes:  Negative for eye problems and icterus.  Respiratory:  Negative for chest tightness, cough, hemoptysis, shortness of breath and wheezing.   Cardiovascular:  Negative for chest pain and leg swelling.  Gastrointestinal:  Negative for abdominal distention, abdominal pain, blood in stool, diarrhea, nausea and rectal pain.  Endocrine: Negative for hot flashes.  Genitourinary:  Negative for bladder incontinence, difficulty urinating, dysuria,  frequency, hematuria and nocturia.   Musculoskeletal:  Negative for arthralgias, back pain, flank pain, gait problem and myalgias.  Skin:  Negative for itching and rash.  Neurological:  Negative for dizziness, gait problem, headaches, light-headedness, numbness and seizures.  Hematological:  Negative for adenopathy. Does not bruise/bleed easily.  Psychiatric/Behavioral:  Negative for confusion and decreased concentration. The patient is not nervous/anxious.     MEDICAL HISTORY:  Past Medical History:  Diagnosis Date   Arthritis    Back pain    with leg pain   Basal cell carcinoma 09/04/2009   R cheek 5.5 cm ant to earlobe - 12/26/2009 excision   Basal cell carcinoma 09/04/2009   R ant nasal alar rim   Basal cell carcinoma 07/31/2014   R mid brow   Basal cell carcinoma 07/30/2016   R ant nasal alar rim at ant edge of BCC scar   Basal cell carcinoma 11/02/2019   L zygoma    Basal cell carcinoma 07/22/2021   left sideburn area, [facility]   Basal cell carcinoma 01/02/2022   Right posterior shoulder, [facility]   Basal cell carcinoma 01/02/2022   R posterior shoulder, [facility]   Basal cell carcinoma 01/02/2022   Right Supraclavicular Base of Neck, [facility]   Cancer of base of tongue ([place]) 2006   s/p chemoradiation   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, bilateral    Dyspnea    Dysrhythmia    Esophageal cancer ([place]) 2021   GERD (gastroesophageal reflux disease)    Hyperlipidemia    Hypertension    Numbness and tingling of both lower extremities    with positioning   Paroxysmal atrial fibrillation ([place])    Port-A-Cath in place 2006  Prostate enlargement    Squamous cell carcinoma of skin 12/14/2019   L pretibial - ED&C    T2DM (type 2 diabetes mellitus) ([place])     SURGICAL HISTORY: Past Surgical History:  Procedure Laterality Date   ABDOMINAL SURGERY     CAROTID PTA/STENT INTERVENTION Right 07/04/2021   Procedure: CAROTID PTA/STENT INTERVENTION;  Surgeon: Algernon Huxley, MD;   Location: [place] [facility];  Service: Cardiovascular;  Laterality: Right;   COLONOSCOPY  12/08/2003   COLONOSCOPY WITH PROPOFOL N/A 03/21/2019   Procedure: COLONOSCOPY WITH PROPOFOL;  Surgeon: Toledo, Benay Pike, MD;  Location: [facility];  Service: Gastroenterology;  Laterality: N/A;   ESOPHAGOGASTRODUODENOSCOPY (EGD) WITH PROPOFOL N/A 03/21/2019   Procedure: ESOPHAGOGASTRODUODENOSCOPY (EGD) WITH PROPOFOL;  Surgeon: Toledo, Benay Pike, MD;  Location: [facility];  Service: Gastroenterology;  Laterality: N/A;   esophogeal cancer     JOINT REPLACEMENT Right    total hip   LUMBAR LAMINECTOMY/DECOMPRESSION MICRODISCECTOMY N/A 03/03/2018   Procedure: LUMBAR LAMINECTOMY/DECOMPRESSION MICRODISCECTOMY 1 LEVEL-L3-4,L4-5;  Surgeon: Meade Maw, MD;  Location: [facility];  Service: Neurosurgery;  Laterality: N/A;   LUMBAR LAMINECTOMY/DECOMPRESSION MICRODISCECTOMY N/A 02/24/2020   Procedure: LEFT L3-4 MICRODISCECTOMY, L4-5 DECOMPRESSION;  Surgeon: Meade Maw, MD;  Location: [facility];  Service: Neurosurgery;  Laterality: N/A;   MAXIMUM ACCESS (MAS) TRANSFORAMINAL LUMBAR INTERBODY FUSION (TLIF) 1 LEVEL N/A 07/13/2020   Procedure: OPEN L3-4 TRANSFORAMINAL LUMBAR INTERBODY FUSION (TLIF);  Surgeon: Meade Maw, MD;  Location: [facility];  Service: Neurosurgery;  Laterality: N/A;   PARTIAL HIP ARTHROPLASTY Right 2014   PORT-A-CATH REMOVAL     PORTACATH PLACEMENT Left 01/12/2020   Procedure: INSERTION PORT-A-CATH, with ultrasound fluoroscopy;  Surgeon: Nestor Lewandowsky, MD;  Location: [facility];  Service: General;  Laterality: Left;   TONGUE BIOPSY  2006   TONSILLECTOMY     TRIGGER FINGER RELEASE Right 2013   UPPER GI ENDOSCOPY  01/25/14   multiple gastric polyps    SOCIAL HISTORY: Social History   Socioeconomic History   Marital status: Widowed    Spouse name: Not on file   Number of children: 1   Years of education: Not on file   Highest education level: Not on file   Occupational History   Not on file  Tobacco Use   Smoking status: Former    Packs/day: 1.00    Years: 30.00    Total pack years: 30.00    Types: Cigarettes    Quit date: 02/18/1994    Years since quitting: 28.3   Smokeless tobacco: Never  Vaping Use   Vaping Use: Never used  Substance and Sexual Activity   Alcohol use: Not Currently   Drug use: No   Sexual activity: Yes  Other Topics Concern   Not on file  Social History Narrative   Lives by himself. Friends in the area. Son Gerald Stabs lives in [place].    Social Determinants of Health   Financial Resource Strain: Not on file  Food Insecurity: Not on file  Transportation Needs: Not on file  Physical Activity: Not on file  Stress: Not on file  Social Connections: Not on file  Intimate Partner Violence: Not on file    FAMILY HISTORY: Family History  Problem Relation Age of Onset   Diabetes Mother    Congestive Heart Failure Mother    Breast cancer Mother    Lung cancer Father     ALLERGIES:  is allergic to sulfa antibiotics.  MEDICATIONS:  Current Outpatient Medications  Medication Sig Dispense Refill  aspirin 81 MG EC tablet Take 1 tablet (81 mg total) by mouth daily at 6 (six) AM. Swallow whole. 30 tablet 11   azelastine (ASTELIN) 0.1 % nasal spray Place 2 sprays into both nostrils 2 (two) times daily. Use in each nostril as directed 30 mL 2   clopidogrel (PLAVIX) 75 MG tablet Take 1 tablet (75 mg total) by mouth daily. 30 tablet 6   cyanocobalamin 1000 MCG tablet Take 1 tablet (1,000 mcg total) by mouth daily. 90 tablet 0   diltiazem (CARDIZEM CD) 180 MG 24 hr capsule Take 1 capsule (180 mg total) by mouth daily. Please hold until you see your cardiologist 90 capsule 3   erythromycin ophthalmic ointment Place 1 Application into the right eye 3 (three) times daily for 10 days. 3.5 g 0   ferrous sulfate 325 (65 FE) MG tablet Take 1 tablet (325 mg total) by mouth 2 (two) times daily with a meal. 60 tablet 3   ibuprofen  (ADVIL) 800 MG tablet Take 1 tablet (800 mg total) by mouth every 8 (eight) hours as needed for pain. 60 tablet 1   methocarbamol (ROBAXIN) 500 MG tablet Take 1 tablet (500 mg total) by mouth every 6 (six) hours as needed. 90 tablet 1   metoprolol succinate (TOPROL-XL) 100 MG 24 hr tablet Take 2 tablets (200 mg total) by mouth once daily 180 tablet 1   mirtazapine (REMERON) 15 MG tablet Take 1 tablet (15 mg total) by mouth nightly 30 tablet 11   omeprazole (PRILOSEC) 40 MG capsule Take 1 capsule (40 mg total) by mouth once daily 90 capsule 3   rosuvastatin (CRESTOR) 10 MG tablet Take 1 tablet (10 mg total) by mouth once daily 90 tablet 3   tamsulosin (FLOMAX) 0.4 MG CAPS capsule TAKE 1 CAPSULE BY MOUTH ONCE DAILY. TAKE 30 MINUTES AFTER SAME MEAL EACH DAY. 90 capsule 3   lisinopril (ZESTRIL) 10 MG tablet TAKE 1 TABLET (10 MG TOTAL) BY MOUTH DAILY. 30 tablet 3   Tiotropium Bromide Monohydrate (SPIRIVA RESPIMAT) 2.5 MCG/ACT AERS Inhale 2 puffs into the lungs daily. (Patient not taking: Reported on 06/02/2022) 4 g 1   No current facility-administered medications for this visit.     PHYSICAL EXAMINATION: ECOG PERFORMANCE STATUS: 1 - Symptomatic but completely ambulatory Vitals:   06/02/22 1327  BP: (!) 157/99  Pulse: 65  Resp: 18  Temp: 97.9 F (36.6 C)  SpO2: 100%   Filed Weights   06/02/22 1327  Weight: 155 lb 4.8 oz (70.4 kg)    Physical Exam Constitutional:      General: He is not in acute distress. HENT:     Head: Normocephalic and atraumatic.  Eyes:     General: No scleral icterus.    Pupils: Pupils are equal, round, and reactive to light.  Cardiovascular:     Rate and Rhythm: Normal rate and regular rhythm.     Heart sounds: Normal heart sounds.  Pulmonary:     Effort: Pulmonary effort is normal. No respiratory distress.     Breath sounds: No wheezing.  Abdominal:     General: Bowel sounds are normal. There is no distension.     Palpations: Abdomen is soft. There is no  mass.     Tenderness: There is no abdominal tenderness.  Musculoskeletal:        General: No deformity. Normal range of motion.     Cervical back: Normal range of motion and neck supple.  Skin:    General:  Skin is warm and dry.     Findings: No erythema or rash.  Neurological:     Mental Status: He is alert and oriented to person, place, and time. Mental status is at baseline.     Cranial Nerves: No cranial nerve deficit.     Coordination: Coordination normal.  Psychiatric:        Mood and Affect: Mood normal.        Behavior: Behavior normal.        Thought Content: Thought content normal.     LABORATORY DATA:  I have reviewed the data as listed Lab Results  Component Value Date   WBC 8.3 06/02/2022   HGB 12.1 (L) 06/02/2022   HCT 40.5 06/02/2022   MCV 83.0 06/02/2022   PLT 259 06/02/2022   Recent Labs    12/30/21 0833 04/25/22 2114 04/26/22 0032 04/26/22 0355 06/02/22 1302  NA 139 138  --  141 139  K 4.2 4.1  --  4.2 4.5  CL 108 107  --  108 108  CO2 24 18*  --  26 26  GLUCOSE 173* 152*  --  120* 145*  BUN 19 18  --  16 19  CREATININE 1.01 1.24  --  1.05 1.06  CALCIUM 8.4* 8.1*  --  8.1* 8.4*  GFRNONAA >60 >60  --  >60 >60  PROT 6.7  --  7.0  --  6.6  ALBUMIN 3.7  --  3.9  --  3.7  AST 26  --  19  --  26  ALT 17  --  15  --  23  ALKPHOS 75  --  77  --  70  BILITOT 0.3  --  0.6  --  0.5  BILIDIR  --   --  <0.1  --   --   IBILI  --   --  NOT CALCULATED  --   --     Iron/TIBC/Ferritin/ %Sat    Component Value Date/Time   IRON 271 (H) 06/02/2022 1302   TIBC 447 06/02/2022 1302   FERRITIN 10 (L) 06/02/2022 1302   IRONPCTSAT 61 (H) 06/02/2022 1302      RADIOGRAPHIC STUDIES: I have personally reviewed the radiological images as listed and agreed with the findings in the report. CT ANGIO HEAD NECK W WO CM  Result Date: 04/26/2022 CLINICAL DATA:  Syncopal episode. Patient reports numbness and tingling in his left arm prior to losing consciousness. No  trauma to head. 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 EXAM: CT ANGIOGRAPHY HEAD AND NECK TECHNIQUE: Multidetector CT imaging of the head and neck was performed using the standard protocol during bolus administration of intravenous contrast. Multiplanar CT image reconstructions and MIPs were obtained to evaluate the vascular anatomy. Carotid stenosis measurements (when applicable) are obtained utilizing NASCET criteria, using the distal internal carotid diameter as the denominator. RADIATION DOSE REDUCTION: This exam was performed according to the departmental dose-optimization program which includes automated exposure control, adjustment of the mA and/or kV according to patient size and/or use of iterative reconstruction technique. CONTRAST:  27m OMNIPAQUE IOHEXOL 350 MG/ML SOLN COMPARISON:  CT head without contrast and MR head without contrast 04/25/2022 FINDINGS: CT HEAD FINDINGS Brain: Acute infarct noted by a MRI is below the resolution of CT. The more remote right parietal infarct is again seen. No infarct expansion or hemorrhage is present. Remote lacunar infarct of the left caudate head is again seen. Basal ganglia are otherwise within normal limits. Mild white  matter changes are stable. The ventricles are proportionate to the degree of atrophy. No significant extraaxial fluid collection is present. The brainstem and cerebellum are within normal limits. Midline structures are within normal limits. Vascular: No hyperdense vessel or unexpected calcification. Skull: Normal. Negative for fracture or focal lesion. Sinuses/Orbits: The paranasal sinuses and mastoid air cells are clear. The globes and orbits are within normal limits. Review of the MIP images confirms the above findings CTA NECK FINDINGS Aortic arch: Atherosclerotic changes are present the aortic arch without significant stenosis at the great vessel origins. Right carotid system: The right common carotid artery is within normal limits.  Proximal right ICA stent is patent without significant residual or recurrent stenosis. High-grade stenosis is present immediately distal to the stent. The more distal right ICA demonstrates no additional stenoses. Left carotid system: Right common carotid artery is within normal limits. Atherosclerotic irregularity is present in the proximal left ICA. Vessel lumen is narrowed to 1.5 mm. More distal ICA measures 3.5 mm. No tandem stenoses are present Vertebral arteries: The right vertebral artery is the dominant vessel. Mild stenosis of less than 50% is present at the origin. No tandem stenoses scratched at no other significant stenosis is present in the right vertebral artery. The left vertebral artery is hypoplastic without focal stenosis. Skeleton: Degenerative changes are present throughout the cervical and upper thoracic spine. Vertebral body heights are maintained. No acute or focal osseous lesion is present. Other neck: Soft tissues the neck are otherwise unremarkable. Salivary glands are within normal limits. Thyroid is normal. No significant adenopathy is present. No focal mucosal or submucosal lesions are present. Upper chest: Multiple pulmonary nodules are present within the right lung, not significantly changed the prior exam. Index nodule posteriorly in the right lower lobe is stable at 14 x 7 mm on image 14 of series 5. Esophagectomy and gastric pull-through again noted. Review of the MIP images confirms the above findings CTA HEAD FINDINGS Anterior circulation: No significant stenosis is present the skull base through the ICA termini. The right A1 segment is dominant. M1 segments are within normal limits bilaterally. The MCA bifurcations are within normal limits. The ACA and MCA branch vessels are within normal limits bilaterally. No significant proximal stenosis or occlusion is present. No aneurysm is present. Posterior circulation: High-grade stenosis is present at the dural margin of the hypoplastic  left vertebral artery. The PICA fills from collaterals. Right vertebral artery is unremarkable. The basilar artery is normal. Both posterior cerebral arteries originate from basilar tip. Left posterior communicating artery contributes. PCA branch vessels are within normal limits bilaterally. Venous sinuses: The dural sinuses are patent. The straight sinus and deep cerebral veins are intact. Cortical veins are within normal limits. No significant vascular malformation is evident. Anatomic variants: Prominent left posterior communicating artery. Review of the MIP images confirms the above findings IMPRESSION: 1. Acute infarct noted by MRI is below the resolution of CT. 2. Stable remote right parietal infarct. 3. Stable remote lacunar infarct of the left caudate head. 4. 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 5. 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 6. 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 7. Mild stenosis of less than 50% at the origin of the right vertebral artery. 8. Multiple pulmonary nodules are present within the right lung, not significantly changed the prior exam. 9.  Aortic Atherosclerosis (ICD10-I70.0). Electronically Signed  By: San Morelle M.D.   On: 04/26/2022 17:42   ECHOCARDIOGRAM COMPLETE BUBBLE STUDY  Result Date: 04/26/2022    ECHOCARDIOGRAM REPORT   Patient Name:   IQUAN SEDAM Date of Exam: 04/26/2022 Medical Rec #:  [id number]      Height:       65.0 in Accession #:    [id number]     Weight:       155.0 lb Date of Birth:  Dec 03, 1945     BSA:          1.775 m Patient Age:    30 years       BP:           167/85 mmHg Patient Gender: M              HR:           72 bpm. Exam Location:  [facility] Procedure: 2D Echo, Color Doppler, Cardiac Doppler and Saline Contrast Bubble            Study Indications:     TIA  History:         Patient  has no prior history of Echocardiogram examinations.                  Arrythmias:P-Afib; Risk Factors:Hypertension, HLD and Diabetes.  Sonographer:     L. Thornton-Maynard Referring Phys:  [id number] Soundra Pilon AMIN Diagnosing Phys: Fransico Him MD IMPRESSIONS  1. Left ventricular ejection fraction, by estimation, is 70 to 75%. The left ventricle has hyperdynamic function. The left ventricle has no regional wall motion abnormalities. Left ventricular diastolic parameters were normal.  2. Right ventricular systolic function is normal. The right ventricular size is normal. Tricuspid regurgitation signal is inadequate for assessing PA pressure.  3. The mitral valve is degenerative. Trivial mitral valve regurgitation. No evidence of mitral stenosis.  4. The aortic valve is normal in structure. Aortic valve regurgitation is not visualized. No aortic stenosis is present.  5. The inferior vena cava is normal in size with greater than 50% respiratory variability, suggesting right atrial pressure of 3 mmHg. Conclusion(s)/Recommendation(s): No intracardiac source of embolism detected on this transthoracic study. Consider a transesophageal echocardiogram to exclude cardiac source of embolism if clinically indicated. FINDINGS  Left Ventricle: Left ventricular ejection fraction, by estimation, is 70 to 75%. The left ventricle has hyperdynamic function. The left ventricle has no regional wall motion abnormalities. The left ventricular internal cavity size was normal in size. There is no left ventricular hypertrophy. Left ventricular diastolic parameters were normal. Normal left ventricular filling pressure. Right Ventricle: The right ventricular size is normal. No increase in right ventricular wall thickness. Right ventricular systolic function is normal. Tricuspid regurgitation signal is inadequate for assessing PA pressure. Left Atrium: Left atrial size was normal in size. Right Atrium: Right atrial size was normal in size.  Pericardium: There is no evidence of pericardial effusion. Mitral Valve: The mitral valve is degenerative in appearance. There is mild calcification of the mitral valve leaflet(s). Mild to moderate mitral annular calcification. Trivial mitral valve regurgitation. No evidence of mitral valve stenosis. Tricuspid Valve: The tricuspid valve is normal in structure. Tricuspid valve regurgitation is not demonstrated. No evidence of tricuspid stenosis. Aortic Valve: The aortic valve is normal in structure. Aortic valve regurgitation is not visualized. No aortic stenosis is present. Aortic valve mean gradient measures 5.0 mmHg. Aortic valve peak gradient measures 10.2 mmHg. Aortic valve area, by VTI measures 1.65 cm. Pulmonic Valve: The pulmonic valve  was normal in structure. Pulmonic valve regurgitation is not visualized. No evidence of pulmonic stenosis. Aorta: The aortic root is normal in size and structure. Venous: The inferior vena cava is normal in size with greater than 50% respiratory variability, suggesting right atrial pressure of 3 mmHg. IAS/Shunts: No atrial level shunt detected by color flow Doppler. Agitated saline contrast was given intravenously to evaluate for intracardiac shunting.  LEFT VENTRICLE PLAX 2D LVIDd:         3.70 cm     Diastology LVIDs:         2.70 cm     LV e' medial:    8.38 cm/s LV PW:         0.90 cm     LV E/e' medial:  9.4 LV IVS:        0.90 cm     LV e' lateral:   9.57 cm/s LVOT diam:     1.90 cm     LV E/e' lateral: 8.2 LV SV:         51 LV SV Index:   29 LVOT Area:     2.84 cm  LV Volumes (MOD) LV vol d, MOD A2C: 71.5 ml LV vol d, MOD A4C: 84.8 ml LV vol s, MOD A2C: 19.1 ml LV vol s, MOD A4C: 27.3 ml LV SV MOD A2C:     52.4 ml LV SV MOD A4C:     84.8 ml LV SV MOD BP:      57.2 ml RIGHT VENTRICLE RV S prime:     15.30 cm/s TAPSE (M-mode): 2.1 cm LEFT ATRIUM             Index        RIGHT ATRIUM           Index LA diam:        3.40 cm 1.92 cm/m   RA Area:     16.50 cm LA Vol (A2C):    79.2 ml 44.62 ml/m  RA Volume:   40.90 ml  23.04 ml/m LA Vol (A4C):   83.7 ml 47.15 ml/m LA Biplane Vol: 84.9 ml 47.83 ml/m  AORTIC VALVE AV Area (Vmax):    1.58 cm AV Area (Vmean):   1.54 cm AV Area (VTI):     1.65 cm AV Vmax:           159.50 cm/s AV Vmean:          106.000 cm/s AV VTI:            0.310 m AV Peak Grad:      10.2 mmHg AV Mean Grad:      5.0 mmHg LVOT Vmax:         89.10 cm/s LVOT Vmean:        57.600 cm/s LVOT VTI:          0.180 m LVOT/AV VTI ratio: 0.58  AORTA Ao Root diam: 3.20 cm Ao Asc diam:  2.90 cm MITRAL VALVE MV Area (PHT): 2.66 cm    SHUNTS MV Decel Time: 285 msec    Systemic VTI:  0.18 m MV E velocity: 78.60 cm/s  Systemic Diam: 1.90 cm MV A velocity: 92.20 cm/s MV E/A ratio:  0.85 Fransico Him MD Electronically signed by Fransico Him MD Signature Date/Time: 04/26/2022/4:43:50 PM    Final    US Venous Img Lower Bilateral (DVT)  Result Date: 04/26/2022 CLINICAL DATA:  Bilateral lower extremity edema EXAM: BILATERAL LOWER EXTREMITY VENOUS DOPPLER ULTRASOUND TECHNIQUE: Gray-scale  sonography with graded compression, as well as color Doppler and duplex ultrasound were performed to evaluate the lower extremity deep venous systems from the level of the common femoral vein and including the common femoral, femoral, profunda femoral, popliteal and calf veins including the posterior tibial, peroneal and gastrocnemius veins when visible. The superficial great saphenous vein was also interrogated. Spectral Doppler was utilized to evaluate flow at rest and with distal augmentation maneuvers in the common femoral, femoral and popliteal veins. COMPARISON:  None Available. FINDINGS: RIGHT LOWER EXTREMITY Common Femoral Vein: No evidence of thrombus. Normal compressibility, respiratory phasicity and response to augmentation. Saphenofemoral Junction: No evidence of thrombus. Normal compressibility and flow on color Doppler imaging. Profunda Femoral Vein: No evidence of thrombus. Normal  compressibility and flow on color Doppler imaging. Femoral Vein: No evidence of thrombus. Normal compressibility, respiratory phasicity and response to augmentation. Popliteal Vein: No evidence of thrombus. Normal compressibility, respiratory phasicity and response to augmentation. Calf Veins: No evidence of thrombus. Normal compressibility and flow on color Doppler imaging. Superficial Great Saphenous Vein: No evidence of thrombus. Normal compressibility. Venous Reflux:  None. Other Findings:  None. LEFT LOWER EXTREMITY Common Femoral Vein: No evidence of thrombus. Normal compressibility, respiratory phasicity and response to augmentation. Saphenofemoral Junction: No evidence of thrombus. Normal compressibility and flow on color Doppler imaging. Profunda Femoral Vein: No evidence of thrombus. Normal compressibility and flow on color Doppler imaging. Femoral Vein: No evidence of thrombus. Normal compressibility, respiratory phasicity and response to augmentation. Popliteal Vein: No evidence of thrombus. Normal compressibility, respiratory phasicity and response to augmentation. Calf Veins: No evidence of thrombus. Normal compressibility and flow on color Doppler imaging. Superficial Great Saphenous Vein: No evidence of thrombus. Normal compressibility. Venous Reflux:  None. Other Findings:  None. IMPRESSION: No evidence of deep venous thrombosis in either lower extremity. Electronically Signed   By: Jacqulynn Cadet M.D.   On: 04/26/2022 06:09   MR BRAIN WO CONTRAST  Result Date: 04/26/2022 CLINICAL DATA:  Initial evaluation for acute TIA. EXAM: MRI HEAD WITHOUT CONTRAST TECHNIQUE: Multiplanar, multiecho pulse sequences of the brain and surrounding structures were obtained without intravenous contrast. COMPARISON:  Prior CT from earlier the same day. FINDINGS: Brain: Cerebral volume within normal limits. Mild chronic microvascular ischemic disease for age. Small remote right parietal cortical infarct. Tiny  remote left cerebellar infarct noted. 5 mm focus of restricted diffusion involving the right parietal lobe, postcentral gyrus, consistent with a tiny acute ischemic infarct (series 9, image 34). This is immediately adjacent to the underlying chronic right parietal infarct. No associated hemorrhage or mass effect. No other evidence for acute or subacute ischemia. No acute intracranial hemorrhage. Few punctate chronic micro hemorrhages noted within the right cerebral hemisphere. No mass lesion, midline shift or mass effect. No hydrocephalus or extra-axial fluid collection. Pituitary gland and suprasellar region within normal limits. Vascular: Major intracranial vascular flow voids are maintained. Skull and upper cervical spine: Craniocervical junction level limits. Bone marrow signal intensity normal. No scalp soft tissue abnormality. Sinuses/Orbits: Globes and orbital soft tissues within normal limits. Mild scattered mucosal thickening noted about the ethmoidal air cells. Paranasal sinuses are otherwise clear. No mastoid effusion. Other: None. IMPRESSION: 1. 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 2. 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 Electronically Signed   By: Jeannine Boga M.D.   On: 04/26/2022 00:30   CT HEAD WO CONTRAST (5MM)  Result Date: 04/25/2022 CLINICAL DATA:  Syncope/presyncope, cerebrovascular cause  suspected EXAM: CT HEAD WITHOUT CONTRAST TECHNIQUE: Contiguous axial images were obtained from the base of the skull through the vertex without intravenous contrast. RADIATION DOSE REDUCTION: This exam was performed according to the departmental dose-optimization program which includes automated exposure control, adjustment of the mA and/or kV according to patient size and/or use of iterative reconstruction technique. COMPARISON:  06/26/2020 FINDINGS: Brain: Encephalomalacia in the right parietal  lobe is new from prior exam, suggestive of interval ischemia. No acute intracranial hemorrhage. Stable degree of atrophy and periventricular chronic small vessel ischemia. 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 No evidence of territorial ischemia. No subdural or extra-axial collection. No midline shift or mass lesion/mass effect. Vascular: No hyperdense vessel or unexpected calcification. Skull: No fracture or focal lesion. Sinuses/Orbits: Scattered mucosal thickening of ethmoid air cells. Decreased secretions in right side of sphenoid sinus. Other: None. IMPRESSION: 1. No acute intracranial abnormality. 2. 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 3. Stable atrophy and chronic small vessel ischemia. Electronically Signed   By: Keith Rake M.D.   On: 04/25/2022 22:38   DG Chest Portable 1 View  Result Date: 04/25/2022 CLINICAL DATA:  Syncope.  Eval for infiltrate EXAM: PORTABLE CHEST 1 VIEW COMPARISON:  06/26/2020 FINDINGS: Reticulonodular opacities greatest in the right lung are similar to slightly increased from 06/26/2020. Question trace right pleural effusion or pleural thickening. No pneumothorax. Stable cardiomediastinal silhouette. No acute osseous abnormality. Left chest wall Port-A-Cath tip in the low SVC. Remote right rib fracture. IMPRESSION: Reticulonodular opacities in the right lung are similar to slightly increased from 06/26/2020 and suggestive of bronchopneumonia. Electronically Signed   By: Placido Sou M.D.   On: 04/25/2022 22:13

## 2022-06-02 NOTE — Assessment & Plan Note (Signed)
#  Esophageal adenocarcinoma, Stage IIB Patient is status post esophagectomy, adjuvant chemotherapy includes 1 cycle of Xeloda/oxaliplatin.  Additional chemotherapy was not completed due to multiple hospitalization due to infection and back surgery. Labs are reviewed and discussed with patient Showed no evidence of recurrence. Continue surveillance.  Obtain CT chest abdomen pelvis in April 2024

## 2022-06-02 NOTE — Assessment & Plan Note (Signed)
Recommend patient to continue oral iron supplementation.  Repeat levels in 6 months.  Consider IV Venofer treatments if no improvement.

## 2022-06-02 NOTE — Assessment & Plan Note (Signed)
Stable.  Recommend patient to follow-up with pulmonology Dr. Halford Chessman

## 2022-06-02 NOTE — Progress Notes (Signed)
Pt here for follow up. Reports that he had a stroke about a month ago.

## 2022-06-02 NOTE — Assessment & Plan Note (Signed)
Recommend patient to start calcium and vitamin D supplementation.

## 2022-06-03 ENCOUNTER — Other Ambulatory Visit: Payer: Self-pay

## 2022-06-04 LAB — CEA: CEA: 3 ng/mL (ref 0.0–4.7)

## 2022-06-11 ENCOUNTER — Ambulatory Visit: Payer: Medicare HMO | Admitting: Cardiology

## 2022-06-11 DIAGNOSIS — I2510 Atherosclerotic heart disease of native coronary artery without angina pectoris: Secondary | ICD-10-CM | POA: Diagnosis not present

## 2022-06-11 DIAGNOSIS — I25118 Atherosclerotic heart disease of native coronary artery with other forms of angina pectoris: Secondary | ICD-10-CM | POA: Diagnosis not present

## 2022-06-11 DIAGNOSIS — I10 Essential (primary) hypertension: Secondary | ICD-10-CM | POA: Diagnosis not present

## 2022-06-11 DIAGNOSIS — I480 Paroxysmal atrial fibrillation: Secondary | ICD-10-CM | POA: Diagnosis not present

## 2022-06-11 DIAGNOSIS — J438 Other emphysema: Secondary | ICD-10-CM | POA: Diagnosis not present

## 2022-06-11 DIAGNOSIS — E782 Mixed hyperlipidemia: Secondary | ICD-10-CM | POA: Diagnosis not present

## 2022-06-11 DIAGNOSIS — E118 Type 2 diabetes mellitus with unspecified complications: Secondary | ICD-10-CM | POA: Diagnosis not present

## 2022-06-25 ENCOUNTER — Other Ambulatory Visit: Payer: Self-pay

## 2022-07-03 ENCOUNTER — Ambulatory Visit
Admission: RE | Admit: 2022-07-03 | Discharge: 2022-07-03 | Disposition: A | Discharge status: Home / Self Care | Payer: Medicare HMO | Source: Ambulatory Visit | Attending: Oncology | Admitting: Oncology

## 2022-07-03 ENCOUNTER — Other Ambulatory Visit: Payer: Medicare HMO

## 2022-07-03 ENCOUNTER — Inpatient Hospital Stay: Payer: Medicare HMO | Attending: Oncology

## 2022-07-03 DIAGNOSIS — Z8501 Personal history of malignant neoplasm of esophagus: Secondary | ICD-10-CM | POA: Insufficient documentation

## 2022-07-03 DIAGNOSIS — C159 Malignant neoplasm of esophagus, unspecified: Secondary | ICD-10-CM | POA: Diagnosis not present

## 2022-07-03 DIAGNOSIS — Z08 Encounter for follow-up examination after completed treatment for malignant neoplasm: Secondary | ICD-10-CM | POA: Diagnosis not present

## 2022-07-03 DIAGNOSIS — N200 Calculus of kidney: Secondary | ICD-10-CM | POA: Diagnosis not present

## 2022-07-03 DIAGNOSIS — R918 Other nonspecific abnormal finding of lung field: Secondary | ICD-10-CM | POA: Insufficient documentation

## 2022-07-03 DIAGNOSIS — D509 Iron deficiency anemia, unspecified: Secondary | ICD-10-CM | POA: Diagnosis not present

## 2022-07-03 DIAGNOSIS — K5730 Diverticulosis of large intestine without perforation or abscess without bleeding: Secondary | ICD-10-CM | POA: Diagnosis not present

## 2022-07-03 DIAGNOSIS — J9809 Other diseases of bronchus, not elsewhere classified: Secondary | ICD-10-CM | POA: Diagnosis not present

## 2022-07-03 DIAGNOSIS — D500 Iron deficiency anemia secondary to blood loss (chronic): Secondary | ICD-10-CM

## 2022-07-03 DIAGNOSIS — C155 Malignant neoplasm of lower third of esophagus: Secondary | ICD-10-CM

## 2022-07-03 LAB — CBC WITH DIFFERENTIAL/PLATELET
Abs Immature Granulocytes: 0.03 10*3/uL (ref 0.00–0.07)
Basophils Absolute: 0 10*3/uL (ref 0.0–0.1)
Basophils Relative: 1 %
Eosinophils Absolute: 0.2 10*3/uL (ref 0.0–0.5)
Eosinophils Relative: 2 %
HCT: 44.4 % (ref 39.0–52.0)
Hemoglobin: 13.8 g/dL (ref 13.0–17.0)
Immature Granulocytes: 0 %
Lymphocytes Relative: 11 %
Lymphs Abs: 0.8 10*3/uL (ref 0.7–4.0)
MCH: 26.4 pg (ref 26.0–34.0)
MCHC: 31.1 g/dL (ref 30.0–36.0)
MCV: 84.9 fL (ref 80.0–100.0)
Monocytes Absolute: 0.7 10*3/uL (ref 0.1–1.0)
Monocytes Relative: 8 %
Neutro Abs: 6.3 10*3/uL (ref 1.7–7.7)
Neutrophils Relative %: 78 %
Platelets: 214 10*3/uL (ref 150–400)
RBC: 5.23 MIL/uL (ref 4.22–5.81)
RDW: 18.7 % — ABNORMAL HIGH (ref 11.5–15.5)
WBC: 8 10*3/uL (ref 4.0–10.5)
nRBC: 0 % (ref 0.0–0.2)

## 2022-07-03 LAB — COMPREHENSIVE METABOLIC PANEL
ALT: 38 U/L (ref 0–44)
AST: 43 U/L — ABNORMAL HIGH (ref 15–41)
Albumin: 3.8 g/dL (ref 3.5–5.0)
Alkaline Phosphatase: 76 U/L (ref 38–126)
Anion gap: 8 (ref 5–15)
BUN: 14 mg/dL (ref 8–23)
CO2: 24 mmol/L (ref 22–32)
Calcium: 8.4 mg/dL — ABNORMAL LOW (ref 8.9–10.3)
Chloride: 104 mmol/L (ref 98–111)
Creatinine, Ser: 1.05 mg/dL (ref 0.61–1.24)
GFR, Estimated: >60 mL/min (ref >60)
Glucose, Bld: 147 mg/dL — ABNORMAL HIGH (ref 70–99)
Potassium: 4.1 mmol/L (ref 3.5–5.1)
Sodium: 136 mmol/L (ref 135–145)
Total Bilirubin: 0.6 mg/dL (ref 0.3–1.2)
Total Protein: 6.6 g/dL (ref 6.5–8.1)

## 2022-07-03 LAB — IRON AND TIBC
Iron: 68 ug/dL (ref 45–182)
Saturation Ratios: 18 % (ref 17.9–39.5)
TIBC: 386 ug/dL (ref 250–450)
UIBC: 318 ug/dL

## 2022-07-03 LAB — FERRITIN: Ferritin: 31 ng/mL (ref 24–336)

## 2022-07-03 MED ORDER — HEPARIN SOD (PORK) LOCK FLUSH 100 UNIT/ML IV SOLN
500.0000 [IU] | Freq: Once | INTRAVENOUS | Status: AC
  Administered 2022-07-03: 500 [IU] via INTRAVENOUS

## 2022-07-03 MED ORDER — IOHEXOL 300 MG/ML  SOLN
100.0000 mL | Freq: Once | INTRAMUSCULAR | Status: AC | PRN
  Administered 2022-07-03: 100 mL via INTRAVENOUS

## 2022-07-04 LAB — CEA: CEA: 2.7 ng/mL (ref 0.0–4.7)

## 2022-07-09 ENCOUNTER — Inpatient Hospital Stay (HOSPITAL_BASED_OUTPATIENT_CLINIC_OR_DEPARTMENT_OTHER): Payer: Medicare HMO | Admitting: Oncology

## 2022-07-09 ENCOUNTER — Encounter: Payer: Self-pay | Admitting: Oncology

## 2022-07-09 VITALS — BP 133/50 | HR 73 | Temp 96.2°F | Resp 18 | Wt 151.2 lb

## 2022-07-09 DIAGNOSIS — Z8501 Personal history of malignant neoplasm of esophagus: Secondary | ICD-10-CM | POA: Diagnosis not present

## 2022-07-09 DIAGNOSIS — R918 Other nonspecific abnormal finding of lung field: Secondary | ICD-10-CM

## 2022-07-09 DIAGNOSIS — D500 Iron deficiency anemia secondary to blood loss (chronic): Secondary | ICD-10-CM

## 2022-07-09 DIAGNOSIS — C155 Malignant neoplasm of lower third of esophagus: Secondary | ICD-10-CM

## 2022-07-09 DIAGNOSIS — D509 Iron deficiency anemia, unspecified: Secondary | ICD-10-CM | POA: Diagnosis not present

## 2022-07-09 DIAGNOSIS — Z08 Encounter for follow-up examination after completed treatment for malignant neoplasm: Secondary | ICD-10-CM | POA: Diagnosis not present

## 2022-07-09 NOTE — Assessment & Plan Note (Addendum)
#  Esophageal adenocarcinoma, Stage IIB Patient is status post esophagectomy, adjuvant chemotherapy includes 1 cycle of Xeloda/oxaliplatin.  Additional chemotherapy was not completed due to multiple hospitalization due to infection and back surgery. Labs are reviewed and discussed with patient April 2024 CT no evidence of recurrence. Continue surveillance.  Obtain CT chest abdomen pelvis in 6 months

## 2022-07-09 NOTE — Assessment & Plan Note (Addendum)
Recommend patient to start calcium 1200mg  and vitamin D supplementation.

## 2022-07-09 NOTE — Assessment & Plan Note (Signed)
Hb and iron have improved  Continue  oral iron supplementation No need for IV Venofer.

## 2022-07-09 NOTE — Assessment & Plan Note (Signed)
Stable.  Recommend patient to follow-up with pulmonology Dr. Sood 

## 2022-07-09 NOTE — Progress Notes (Signed)
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  Additional chemotherapy was not completed due to multiple hospitalization due to infection and back surgery. Labs are reviewed and discussed with patient April 2024 CT no evidence of recurrence. Continue surveillance.  Obtain CT chest abdomen pelvis in 6 months  Lung nodules Stable.  Recommend patient to follow-up with pulmonology Dr. Craige Cotta  IDA (iron deficiency anemia) Hb and iron have improved  Continue  oral iron supplementation No need for IV Venofer.   Hypocalcemia Recommend patient to start calcium 1200mg  and vitamin D supplementation.  Orders Placed This Encounter  Procedures   CT CHEST ABDOMEN PELVIS W CONTRAST    Standing Status:   Future    Standing Expiration Date:   07/09/2023    Order Specific Question:   If indicated for the ordered procedure, I authorize the administration of contrast media per Radiology protocol    Answer:   Yes    Order Specific Question:   Does the patient have a contrast media/X-ray dye allergy?    Answer:   Yes    Order Specific Question:   Preferred imaging location?    Answer:   [facility]    Order Specific Question:   Is Oral Contrast requested for this exam?    Answer:   Yes, Per Radiology protocol   CBC with Differential (Cancer Center Only)    Standing Status:   Future    Standing Expiration Date:   07/09/2023   CMP (Cancer Center only)     Standing Status:   Future    Standing Expiration Date:   07/09/2023   CEA    Standing Status:   Future    Standing Expiration Date:   07/09/2023   Iron and TIBC    Standing Status:   Future    Standing Expiration Date:   07/09/2023   Ferritin    Standing Status:   Future    Standing Expiration Date:   07/09/2023   Vitamin D 25 hydroxy    Standing Status:   Future    Standing Expiration Date:   07/09/2023    Follow-up in 6 months. All questions were answered. The patient knows to call the clinic with any problems, questions or concerns.  Rickard Patience, MD, PhD [facility] Hematology Oncology 07/09/2022    HISTORY OF PRESENTING ILLNESS:   Blake [name] is a  77 y.o.  male presents for follow-up of esophageal cancer Patient has longstanding Barrett's esophagus. Oncology History  Malignant neoplasm of lower third of esophagus  07/14/2019 Initial Diagnosis   Patient has longstanding Barrett's esophagus. 03/21/2019 patient underwent surveillance endoscopy which showed esophageal mucosal changes secondary to long segment Barrett's disease present in the lower third of the esophagus.  Maximal longitudinal extent of these mucosal changes were 7 cm in length.  Mucosa was biopsied with cold forceps for histology in 4 quadrants at the interval of 2 cm in the lower third of esophagus.  2 cm hiatal hernia, gastritis Esophageal biopsy 3  out of the 4 biopsies were positive for intramucosal carcinoma involving Barrett's mucosa with high-grade dysplasia.  1 out of 4 esophagus biopsy showed Barrett's mucosa with erosion, indefinite for dysplasia  06/09/19, EMR at [facility].  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 EMR specimen was received in multiple fragments and margins can not be evaluated.  1 gastric polyps were biopsied which showed fundic gland polyp. Negative for dysplasia   08/08/19, to OR for EGD with sessile tumor from 29-33cm, at least 1.5 cm wide. Pathology with  the following: 1) esophagus (30cm) - residual invasive adenocarcinoma, at least invasive to muscularis mucosa and 2) esophagus (30cm) - superficial fragments of highly dysplastic epithelium, focally suspicious for intramucosal carcinoma    09/12/2019 Surgery    At [facility], patient underwent esophagectomy with pathological revealed  Negative esophageal margin, gastric margin, esophagogastrectomy, invasive moderately differentiated esophageal adenocarcinoma involved distal esophageal and GE junction, tumor focally invades the submucosa.  Left gastric lymph node x 4 negative, greater curvature lymph nodes 1/3 is positive for 2mm metastatic carcinoma.  Periesophageal lymph node x 6, negative, final pathology stage pT1b pn1, Esophageal adenocarcinoma, Stage IIB Lymph node positive diease, no neoadjuvant chemotherapy, recommended by  Dr. Rod Mae and was recommended for adjuvant chemotherapy.  Patient prefers to receive treatment closer to home and patient call back to establish care. No radiation was recommended due to the concern of dehiscence. 11/28/2019, patient had a stent removal.    12/09/2019 Cancer Staging   Staging form: Esophagus - Adenocarcinoma, AJCC 8th Edition - Pathologic stage from 12/09/2019: Stage IIB (pT1b, pN1, cM0, G2) - Signed by Rickard Patience, MD on 12/09/2019    11/2019 -  Chemotherapy   September 2021, patient underwent Xeloda 1500 mg twice daily/oxaliplatin.  He reports not tolerating well due to nausea and diarrhea. 12/24/2019 patient's wife was diagnosed with COVID-19 infection and patient was tested negative.  Both patient and wife received monoclonal antibody treatments.  Patient prefers to switch to FOLFOX. He had Mediport placed on 01/12/2020 by Dr. Inez Catalina. Additional chemotherapy was interrupted and delayed due to his aspiration pneumonia, and later due to his preference of proceeding with back surgery first- see below.    01/18/2020 -  Hospital Admission   Patient was advised to go  to emergency room and was later admitted for pneumonia treatments.  There was concern of aspiration pneumonia.  Patient was evaluated by speech therapy.  No overt clinical signs of aspiration during p.o. intake.  Cannot rule out possibility of esophageal phase retrograde flow activity patient denies further swallowing evaluation.  Patient was discharged on 01/19/2020.   01/27/2020 Imaging   CT chest with contrast showed extensive nodular airspace process in the right lung with loss of volume and areas of subpleural atelectasis.  Likely polylobar pneumonia with aspiration.  No mediastinal or hilar mass or adenopathy.  Surgical changes   02/24/2020 Surgery   patient had L4-5 lumbar decompression, L3--4 microdiscectomy.  Per note he tolerates procedure very well. djuvant chemotherapy has been delayed significantly due to above reasons. At that point, the benefit is uncertain to resume adjuvant chemotherapy 9 months after original surgery.   04/10/2020 Mammogram   PET scan showed progressive right lung area of dense consolidation and clustering nodularity with correlated hypermetabolic him.  Favor aspiration/chronic infection.  No signs of metastatic disease.  Decision was made to continue observation    06/26/2020 - 06/27/2020 Hospital Admission   admission due to chest pain. Toponin was negative, CXR negative.CT chest  angiogram showed np PE, multifocal pneumonia through out right lung, improved. Increasing moderate left pleural effusion. Unchanged trace right pleural effusion.   07/12/2020 Imaging   CT abdomen pelvis with contrast - no findings of active malignancy. masslike structure in the left upper quadrant represents a confluent region of fat necrosis. Not hypermetabolic on previous PET. Nodular and bandlike opacities at right lung base have improved comparing to 06/26/2020. Substantial narrowing of right lowe lobe tracheobronchial tree. Other chronic findings.   07/16/2020 Surgery   07/16/2020 s/p  spondylolisthesis   12/04/2020 Imaging   CT chest abdomen pelvis showed no evidence of new or progressive findings to suggest recurrent or metastatic disease.  Stable volume loss right hemithorax with diffuse micro nodularity in the right lung. ? Atypical infection.  He declined pulmonology work up    06/29/2021 Imaging   06/29/2021, CT chest abdomen pelvis with contrast was reviewed by me and discussed with patient No evidence of recurrent or metastatic disease.  extensive posterior centrilobular and tree-in-bud nodularity throughout the right lung.  With areas of peripheral consolidation.  Unchanged and is consistent with atypical infection   12/30/2021 Imaging   CT chest abdomen pelvis w contrast 1. Status post esophagectomy and gastric pull-through. No evidence of local recurrence or metastatic disease. 2. Numerous solid nodules are seen throughout the right hemithorax,not significantly changed when compared with prior exam and likely due to chronic atypical infection. 3. No evidence of metastatic disease in the abdomen or pelvis. 4. Aortic Atherosclerosis (ICD10-I70.0) and Emphysema (ICD10-J43.9    04/26/2022 Imaging   MRI brain without contrast 1. 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 2. 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 Koleen Nimrod.  His initial PET scan 11/08/2004 showed abnormal localization in a right neck mass possibly a lymph node, slightly asymmetric localization at the base of the tongue.  Pathology is not available in current EMR.  admitted from 04/25/2022 to 04/26/2022. 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   Currently patient is taking Plavix and aspirin 81 mg.  He  has some residual left finger paresthesia.   INTERVAL HISTORY Blake [name] is a 77 y.o. male who has above history reviewed by me today presents for follow up visit for management of esophageal cancer.  Manageable GERD symptoms. He takes iron supplementation twice daily. Tolerates well.  Denies any unintentional weight loss, abdominal pain, nausea vomiting.  Review of Systems  Constitutional:  Negative for appetite change, chills, diaphoresis, fatigue, fever and unexpected weight change.  HENT:   Negative for hearing loss, lump/mass, nosebleeds, sore throat and voice change.   Eyes:  Negative for eye problems and icterus.  Respiratory:  Negative for chest tightness, cough, hemoptysis, shortness of breath and wheezing.   Cardiovascular:  Negative for chest pain and leg swelling.  Gastrointestinal:  Negative for abdominal distention, abdominal pain, blood in stool, diarrhea, nausea and rectal pain.  Endocrine: Negative for hot flashes.  Genitourinary:  Negative for bladder incontinence, difficulty urinating, dysuria, frequency, hematuria and nocturia.   Musculoskeletal:  Negative for arthralgias, back pain, flank pain, gait problem and myalgias.  Skin:  Negative for itching and rash.  Neurological:  Negative for dizziness, gait problem, headaches, light-headedness, numbness and seizures.  Hematological:  Negative for adenopathy.  Does not bruise/bleed easily.  Psychiatric/Behavioral:  Negative for confusion and decreased concentration. The patient is not nervous/anxious.     MEDICAL HISTORY:  Past Medical History:  Diagnosis Date   Arthritis    Back pain    with leg pain   Basal cell carcinoma 09/04/2009   R cheek 5.5 cm ant to earlobe - 12/26/2009 excision   Basal cell carcinoma 09/04/2009   R ant nasal alar rim   Basal cell carcinoma 07/31/2014   R mid brow   Basal cell carcinoma 07/30/2016   R ant nasal alar rim at ant edge of BCC scar   Basal cell carcinoma 11/02/2019   L  zygoma    Basal cell carcinoma 07/22/2021   left sideburn area, [facility]   Basal cell carcinoma 01/02/2022   Right posterior shoulder, [facility]   Basal cell carcinoma 01/02/2022   R posterior shoulder, [facility]   Basal cell carcinoma 01/02/2022   Right Supraclavicular Base of Neck, [facility]   Cancer of base of tongue 2006   s/p chemoradiation   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, bilateral    Dyspnea    Dysrhythmia    Esophageal cancer 2021   GERD (gastroesophageal reflux disease)    Hyperlipidemia    Hypertension    Numbness and tingling of both lower extremities    with positioning   Paroxysmal atrial fibrillation    Port-A-Cath in place 2006   Prostate enlargement    Squamous cell carcinoma of skin 12/14/2019   L pretibial - ED&C    T2DM (type 2 diabetes mellitus)     SURGICAL HISTORY: Past Surgical History:  Procedure Laterality Date   ABDOMINAL SURGERY     CAROTID PTA/STENT INTERVENTION Right 07/04/2021   Procedure: CAROTID PTA/STENT INTERVENTION;  Surgeon: Annice Needy, MD;  Location: [facility];  Service: Cardiovascular;  Laterality: Right;   COLONOSCOPY  12/08/2003   COLONOSCOPY WITH PROPOFOL N/A 03/21/2019   Procedure: COLONOSCOPY WITH PROPOFOL;  Surgeon: Toledo, Boykin Nearing, MD;  Location: [facility] ENDOS[facility];  Service: Gastroenterology;  Laterality: N/A;   ESOPHAGOGASTRODUODENOSCOPY (EGD) WITH PROPOFOL N/A 03/21/2019   Procedure: ESOPHAGOGASTRODUODENOSCOPY (EGD) WITH PROPOFOL;  Surgeon: Toledo, Boykin Nearing, MD;  Location: [facility] ENDOSCOPY;  Service: Gastroenterology;  Laterality: N/A;   esophogeal cancer     JOINT REPLACEMENT Right    total hip   LUMBAR LAMINECTOMY/DECOMPRESSION MICRODISCECTOMY N/A 03/03/2018   Procedure: LUMBAR LAMINECTOMY/DECOMPRESSION MICRODISCECTOMY 1 LEVEL-L3-4,L4-5;  Surgeon: Venetia Night, MD;  Location: [facility] ORS;  Service: Neurosurgery;  Laterality: N/A;   LUMBAR LAMINECTOMY/DECOMPRESSION MICRODISCECTOMY N/A 02/24/2020   Procedure: LEFT  L3-4 MICRODISCECTOMY, L4-5 DECOMPRESSION;  Surgeon: Venetia Night, MD;  Location: [facility] OR[facility];  Service: Neurosurgery;  Laterality: N/A;   MAXIMUM ACCESS (MAS) TRANSFORAMINAL LUMBAR INTERBODY FUSION (TLIF) 1 LEVEL N/A 07/13/2020   Procedure: OPEN L3-4 TRANSFORAMINAL LUMBAR INTERBODY FUSION (TLIF);  Surgeon: Venetia Night, MD;  Location: [facility] OR[facility];  Service: Neurosurgery;  Laterality: N/A;   PARTIAL HIP ARTHROPLASTY Right 2014   PORT-A-CATH REMOVAL     PORTACATH PLACEMENT Left 01/12/2020   Procedure: INSERTION PORT-A-CATH, with ultrasound fluoroscopy;  Surgeon: Hulda Marin, MD;  Location: [facility] ORS;  Service: General;  Laterality: Left;   TONGUE BIOPSY  2006   TONSILLECTOMY     TRIGGER FINGER RELEASE Right 2013   UPPER GI ENDOSCOPY  01/25/14   multiple gastric polyps    SOCIAL HISTORY: Social History   Socioeconomic History   Marital status: Widowed    Spouse name: Not on file  Number of children: 1   Years of education: Not on file   Highest education level: Not on file  Occupational History   Not on file  Tobacco Use   Smoking status: Former    Packs/day: 1.00    Years: 30.00    Additional pack years: 0.00    Total pack years: 30.00    Types: Cigarettes    Quit date: 02/18/1994    Years since quitting: 28.4   Smokeless tobacco: Never  Vaping Use   Vaping Use: Never used  Substance and Sexual Activity   Alcohol use: Not Currently   Drug use: No   Sexual activity: Yes  Other Topics Concern   Not on file  Social History Narrative   Lives by himself. Friends in the area. Son Thayer Ohm lives in [place].    Social Determinants of Health   Financial Resource Strain: Not on file  Food Insecurity: Not on file  Transportation Needs: Not on file  Physical Activity: Not on file  Stress: Not on file  Social Connections: Not on file  Intimate Partner Violence: Not on file    FAMILY HISTORY: Family History  Problem Relation Age of Onset   Diabetes Mother    Congestive  Heart Failure Mother    Breast cancer Mother    Lung cancer Father     ALLERGIES:  is allergic to sulfa antibiotics.  MEDICATIONS:  Current Outpatient Medications  Medication Sig Dispense Refill   aspirin 81 MG EC tablet Take 1 tablet (81 mg total) by mouth daily at 6 (six) AM. Swallow whole. 30 tablet 11   azelastine (ASTELIN) 0.1 % nasal spray Place 2 sprays into both nostrils 2 (two) times daily. Use in each nostril as directed 30 mL 2   clopidogrel (PLAVIX) 75 MG tablet Take 1 tablet (75 mg total) by mouth daily. 30 tablet 6   cyanocobalamin 1000 MCG tablet Take 1 tablet (1,000 mcg total) by mouth daily. 90 tablet 0   diltiazem (CARDIZEM CD) 180 MG 24 hr capsule Take 1 capsule (180 mg total) by mouth daily. Please hold until you see your cardiologist 90 capsule 3   erythromycin ophthalmic ointment Place 1 Application into the right eye 3 (three) times daily for 10 days. 3.5 g 0   ferrous sulfate 325 (65 FE) MG tablet Take 1 tablet (325 mg total) by mouth 2 (two) times daily with a meal. 60 tablet 3   ibuprofen (ADVIL) 800 MG tablet Take 1 tablet (800 mg total) by mouth every 8 (eight) hours as needed for pain. 60 tablet 1   lisinopril (ZESTRIL) 10 MG tablet Take 1 tablet (10 mg total) by mouth daily. 30 tablet 3   methocarbamol (ROBAXIN) 500 MG tablet Take 1 tablet (500 mg total) by mouth every 6 (six) hours as needed. 90 tablet 1   metoprolol succinate (TOPROL-XL) 100 MG 24 hr tablet Take 2 tablets (200 mg total) by mouth once daily 180 tablet 1   mirtazapine (REMERON) 15 MG tablet Take 1 tablet (15 mg total) by mouth nightly 30 tablet 11   omeprazole (PRILOSEC) 40 MG capsule Take 1 capsule (40 mg total) by mouth once daily 90 capsule 3   rosuvastatin (CRESTOR) 10 MG tablet Take 1 tablet (10 mg total) by mouth once daily 90 tablet 3   tamsulosin (FLOMAX) 0.4 MG CAPS capsule TAKE 1 CAPSULE BY MOUTH ONCE DAILY. TAKE 30 MINUTES AFTER SAME MEAL EACH DAY. 90 capsule 3   Tiotropium Bromide  Monohydrate (  SPIRIVA RESPIMAT) 2.5 MCG/ACT AERS Inhale 2 puffs into the lungs daily. (Patient not taking: Reported on 06/02/2022) 4 g 1   No current facility-administered medications for this visit.     PHYSICAL EXAMINATION: ECOG PERFORMANCE STATUS: 1 - Symptomatic but completely ambulatory Vitals:   07/09/22 1333  BP: (!) 133/50  Pulse: 73  Resp: 18  Temp: (!) 96.2 F (35.7 C)  SpO2: 100%   Filed Weights   07/09/22 1333  Weight: 151 lb 3.2 oz (68.6 kg)    Physical Exam Constitutional:      General: He is not in acute distress. HENT:     Head: Normocephalic and atraumatic.  Eyes:     General: No scleral icterus.    Pupils: Pupils are equal, round, and reactive to light.  Cardiovascular:     Rate and Rhythm: Normal rate and regular rhythm.     Heart sounds: Normal heart sounds.  Pulmonary:     Effort: Pulmonary effort is normal. No respiratory distress.     Breath sounds: No wheezing.  Abdominal:     General: Bowel sounds are normal. There is no distension.     Palpations: Abdomen is soft. There is no mass.     Tenderness: There is no abdominal tenderness.  Musculoskeletal:        General: No deformity. Normal range of motion.     Cervical back: Normal range of motion and neck supple.  Skin:    General: Skin is warm and dry.     Findings: No erythema or rash.  Neurological:     Mental Status: He is alert and oriented to person, place, and time. Mental status is at baseline.     Cranial Nerves: No cranial nerve deficit.     Coordination: Coordination normal.  Psychiatric:        Mood and Affect: Mood normal.        Behavior: Behavior normal.        Thought Content: Thought content normal.     LABORATORY DATA:  I have reviewed the data as listed    Latest Ref Rng & Units 07/03/2022   12:05 PM 06/02/2022    1:02 PM 04/26/2022    3:55 AM  CBC  WBC 4.0 - 10.5 K/uL 8.0  8.3  9.5   Hemoglobin 13.0 - 17.0 g/dL 16.1  09.6  04.5   Hematocrit 39.0 - 52.0 % 44.4  40.5   37.8   Platelets 150 - 400 K/uL 214  259  257       Latest Ref Rng & Units 07/03/2022   12:05 PM 06/02/2022    1:02 PM 04/26/2022    3:55 AM  CMP  Glucose 70 - 99 mg/dL 409  811  914   BUN 8 - 23 mg/dL 14  19  16    Creatinine 0.61 - 1.24 mg/dL 7.82  9.56  2.13   Sodium 135 - 145 mmol/L 136  139  141   Potassium 3.5 - 5.1 mmol/L 4.1  4.5  4.2   Chloride 98 - 111 mmol/L 104  108  108   CO2 22 - 32 mmol/L 24  26  26    Calcium 8.9 - 10.3 mg/dL 8.4  8.4  8.1   Total Protein 6.5 - 8.1 g/dL 6.6  6.6    Total Bilirubin 0.3 - 1.2 mg/dL 0.6  0.5    Alkaline Phos 38 - 126 U/L 76  70    AST 15 - 41 U/L 43  26    ALT 0 - 44 U/L 38  23       Iron/TIBC/Ferritin/ %Sat    Component Value Date/Time   IRON 68 07/03/2022 1205   TIBC 386 07/03/2022 1205   FERRITIN 31 07/03/2022 1205   IRONPCTSAT 18 07/03/2022 1205      RADIOGRAPHIC STUDIES: I have personally reviewed the radiological images as listed and agreed with the findings in the report. CT CHEST ABDOMEN PELVIS W CONTRAST  Result Date: 07/04/2022 CLINICAL DATA:  77 year old male with history of esophageal cancer status post surgical resection. Follow-up study. * Tracking Code: BO * EXAM: CT CHEST, ABDOMEN, AND PELVIS WITH CONTRAST TECHNIQUE: Multidetector CT imaging of the chest, abdomen and pelvis was performed following the standard protocol during bolus administration of intravenous contrast. RADIATION DOSE REDUCTION: This exam was performed according to the departmental dose-optimization program which includes automated exposure control, adjustment of the mA and/or kV according to patient size and/or use of iterative reconstruction technique. CONTRAST:  OMNIPAQUE IOHEXOL 300 MG/ML  SOLN COMPARISON:  CT of the chest, abdomen and pelvis 12/30/2021. FINDINGS: CT CHEST FINDINGS Cardiovascular: Heart size is normal. There is no significant pericardial fluid, thickening or pericardial calcification. Atherosclerosis in the thoracic aorta. No  definite coronary artery calcifications identified. Left subclavian single-lumen Port-A-Cath with tip terminating in the distal superior vena cava. Mediastinum/Nodes: No pathologically enlarged mediastinal or hilar lymph nodes. Status post esophagectomy and gastric pull-through. No axillary lymphadenopathy. Lungs/Pleura: Again noted is widespread peribronchovascular predominant micro and macronodularity throughout the right lung, similar to numerous prior examinations, presumably sequela of chronic indolent atypical infection with areas of chronic mucoid impaction within terminal bronchioles and distal bronchi. Left lung is clear. No pleural effusions. Musculoskeletal: There are no aggressive appearing lytic or blastic lesions noted in the visualized portions of the skeleton. CT ABDOMEN PELVIS FINDINGS Hepatobiliary: No suspicious cystic or solid hepatic lesions. No intra or extrahepatic biliary ductal dilatation. Gallbladder is unremarkable in appearance. Pancreas: No pancreatic mass. No pancreatic ductal dilatation. No pancreatic or peripancreatic fluid collections or inflammatory changes. Spleen: Unremarkable. Adrenals/Urinary Tract: Multifocal cortical thinning in the kidneys bilaterally (left-greater-than-right), indicative of areas of chronic post infectious or inflammatory scarring. Subcentimeter low-attenuation lesions in both kidneys, too small to definitively characterize, but similar to prior studies and statistically likely to represent tiny cysts. 2 mm nonobstructive calculus in the interpolar collecting system of the left kidney. No hydroureteronephrosis. Urinary bladder is largely obscured by beam hardening artifact from the patient's right hip arthroplasty, but visualized portions are unremarkable in appearance. Bilateral adrenal glands are unremarkable in appearance. Stomach/Bowel: No pathologic dilatation of small bowel or colon. Scattered colonic diverticuli are noted, without surrounding  inflammatory changes to indicate an acute diverticulitis at this time. Normal appendix. Vascular/Lymphatic: Aortic atherosclerosis, without evidence of aneurysm or dissection in the abdominal or pelvic vasculature. No lymphadenopathy is noted in the abdomen or pelvis. Reproductive: Prostate gland and seminal vesicles are largely obscured by beam hardening artifact from the patient's right hip arthroplasty. Other: Well-defined mass-like lesion in the left upper quadrant of the abdomen, similar to prior studies (axial image 51 of series 2), currently measuring 6.8 x 3.3 cm, most compatible with a chronic area of fat necrosis based on comparison with numerous prior examinations. No significant volume of ascites. No pneumoperitoneum. Musculoskeletal: There are no aggressive appearing lytic or blastic lesions noted in the visualized portions of the skeleton. Status post right hip arthroplasty. Status post PLIF at L3-L4 with interbody cage at L3-L4 interspace.  IMPRESSION: 1. Status post esophagectomy and gastric pull-through with no definitive imaging findings to suggest metastatic disease in the chest, abdomen or pelvis. 2. Widespread nodularity throughout the right lung very similar to numerous prior examinations, presumably sequela of chronic atypical infection with widespread areas of mucoid impaction. 3. 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 4. Aortic atherosclerosis. 5. Additional incidental findings, as above. Electronically Signed   By: Trudie Reed M.D.   On: 07/04/2022 08:58   CT ANGIO HEAD NECK W WO CM  Result Date: 04/26/2022 CLINICAL DATA:  Syncopal episode. Patient reports numbness and tingling in his left arm prior to losing consciousness. No trauma to head. 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 EXAM: CT ANGIOGRAPHY HEAD AND NECK TECHNIQUE: Multidetector CT imaging of the head and neck was performed using the standard protocol during bolus administration of  intravenous contrast. Multiplanar CT image reconstructions and MIPs were obtained to evaluate the vascular anatomy. Carotid stenosis measurements (when applicable) are obtained utilizing NASCET criteria, using the distal internal carotid diameter as the denominator. RADIATION DOSE REDUCTION: This exam was performed according to the departmental dose-optimization program which includes automated exposure control, adjustment of the mA and/or kV according to patient size and/or use of iterative reconstruction technique. CONTRAST:  75mL OMNIPAQUE IOHEXOL 350 MG/ML SOLN COMPARISON:  CT head without contrast and MR head without contrast 04/25/2022 FINDINGS: CT HEAD FINDINGS Brain: Acute infarct noted by a MRI is below the resolution of CT. The more remote right parietal infarct is again seen. No infarct expansion or hemorrhage is present. Remote lacunar infarct of the left caudate head is again seen. Basal ganglia are otherwise within normal limits. Mild white matter changes are stable. The ventricles are proportionate to the degree of atrophy. No significant extraaxial fluid collection is present. The brainstem and cerebellum are within normal limits. Midline structures are within normal limits. Vascular: No hyperdense vessel or unexpected calcification. Skull: Normal. Negative for fracture or focal lesion. Sinuses/Orbits: The paranasal sinuses and mastoid air cells are clear. The globes and orbits are within normal limits. Review of the MIP images confirms the above findings CTA NECK FINDINGS Aortic arch: Atherosclerotic changes are present the aortic arch without significant stenosis at the great vessel origins. Right carotid system: The right common carotid artery is within normal limits. Proximal right ICA stent is patent without significant residual or recurrent stenosis. High-grade stenosis is present immediately distal to the stent. The more distal right ICA demonstrates no additional stenoses. Left carotid  system: Right common carotid artery is within normal limits. Atherosclerotic irregularity is present in the proximal left ICA. Vessel lumen is narrowed to 1.5 mm. More distal ICA measures 3.5 mm. No tandem stenoses are present Vertebral arteries: The right vertebral artery is the dominant vessel. Mild stenosis of less than 50% is present at the origin. No tandem stenoses scratched at no other significant stenosis is present in the right vertebral artery. The left vertebral artery is hypoplastic without focal stenosis. Skeleton: Degenerative changes are present throughout the cervical and upper thoracic spine. Vertebral body heights are maintained. No acute or focal osseous lesion is present. Other neck: Soft tissues the neck are otherwise unremarkable. Salivary glands are within normal limits. Thyroid is normal. No significant adenopathy is present. No focal mucosal or submucosal lesions are present. Upper chest: Multiple pulmonary nodules are present within the right lung, not significantly changed the prior exam. Index nodule posteriorly in the right lower lobe is stable at 14 x  7 mm on image 14 of series 5. Esophagectomy and gastric pull-through again noted. Review of the MIP images confirms the above findings CTA HEAD FINDINGS Anterior circulation: No significant stenosis is present the skull base through the ICA termini. The right A1 segment is dominant. M1 segments are within normal limits bilaterally. The MCA bifurcations are within normal limits. The ACA and MCA branch vessels are within normal limits bilaterally. No significant proximal stenosis or occlusion is present. No aneurysm is present. Posterior circulation: High-grade stenosis is present at the dural margin of the hypoplastic left vertebral artery. The PICA fills from collaterals. Right vertebral artery is unremarkable. The basilar artery is normal. Both posterior cerebral arteries originate from basilar tip. Left posterior communicating artery  contributes. PCA branch vessels are within normal limits bilaterally. Venous sinuses: The dural sinuses are patent. The straight sinus and deep cerebral veins are intact. Cortical veins are within normal limits. No significant vascular malformation is evident. Anatomic variants: Prominent left posterior communicating artery. Review of the MIP images confirms the above findings IMPRESSION: 1. Acute infarct noted by MRI is below the resolution of CT. 2. Stable remote right parietal infarct. 3. Stable remote lacunar infarct of the left caudate head. 4. 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 5. 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 6. 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 7. Mild stenosis of less than 50% at the origin of the right vertebral artery. 8. Multiple pulmonary nodules are present within the right lung, not significantly changed the prior exam. 9.  Aortic Atherosclerosis (ICD10-I70.0). Electronically Signed   By: Marin Roberts M.D.   On: 04/26/2022 17:42   ECHOCARDIOGRAM COMPLETE BUBBLE STUDY  Result Date: 04/26/2022    ECHOCARDIOGRAM REPORT   Patient Name:   MARQUE RADEMAKER Date of Exam: 04/26/2022 Medical Rec #:  [id number]      Height:       65.0 in Accession #:    [id number]     Weight:       155.0 lb Date of Birth:  [birth year]     BSA:          1.775 m Patient Age:    76 years       BP:           167/85 mmHg Patient Gender: M              HR:           72 bpm. Exam Location:  [facility] Procedure: 2D Echo, Color Doppler, Cardiac Doppler and Saline Contrast Bubble            Study Indications:     TIA  History:         Patient has no prior history of Echocardiogram examinations.                  Arrythmias:P-Afib; Risk Factors:Hypertension, HLD and Diabetes.  Sonographer:     L. Thornton-Maynard Referring Phys:  [id number] Tilman Neat AMIN Diagnosing  Phys: Armanda Magic MD IMPRESSIONS  1. Left ventricular ejection fraction, by estimation, is 70 to 75%. The left ventricle has hyperdynamic function. The left ventricle has no regional wall motion abnormalities. Left ventricular diastolic parameters were normal.  2. Right ventricular systolic function is normal. The right ventricular size is normal. Tricuspid regurgitation signal is inadequate for assessing PA pressure.  3. The mitral valve  is degenerative. Trivial mitral valve regurgitation. No evidence of mitral stenosis.  4. The aortic valve is normal in structure. Aortic valve regurgitation is not visualized. No aortic stenosis is present.  5. The inferior vena cava is normal in size with greater than 50% respiratory variability, suggesting right atrial pressure of 3 mmHg. Conclusion(s)/Recommendation(s): No intracardiac source of embolism detected on this transthoracic study. Consider a transesophageal echocardiogram to exclude cardiac source of embolism if clinically indicated. FINDINGS  Left Ventricle: Left ventricular ejection fraction, by estimation, is 70 to 75%. The left ventricle has hyperdynamic function. The left ventricle has no regional wall motion abnormalities. The left ventricular internal cavity size was normal in size. There is no left ventricular hypertrophy. Left ventricular diastolic parameters were normal. Normal left ventricular filling pressure. Right Ventricle: The right ventricular size is normal. No increase in right ventricular wall thickness. Right ventricular systolic function is normal. Tricuspid regurgitation signal is inadequate for assessing PA pressure. Left Atrium: Left atrial size was normal in size. Right Atrium: Right atrial size was normal in size. Pericardium: There is no evidence of pericardial effusion. Mitral Valve: The mitral valve is degenerative in appearance. There is mild calcification of the mitral valve leaflet(s). Mild to moderate mitral annular calcification.  Trivial mitral valve regurgitation. No evidence of mitral valve stenosis. Tricuspid Valve: The tricuspid valve is normal in structure. Tricuspid valve regurgitation is not demonstrated. No evidence of tricuspid stenosis. Aortic Valve: The aortic valve is normal in structure. Aortic valve regurgitation is not visualized. No aortic stenosis is present. Aortic valve mean gradient measures 5.0 mmHg. Aortic valve peak gradient measures 10.2 mmHg. Aortic valve area, by VTI measures 1.65 cm. Pulmonic Valve: The pulmonic valve was normal in structure. Pulmonic valve regurgitation is not visualized. No evidence of pulmonic stenosis. Aorta: The aortic root is normal in size and structure. Venous: The inferior vena cava is normal in size with greater than 50% respiratory variability, suggesting right atrial pressure of 3 mmHg. IAS/Shunts: No atrial level shunt detected by color flow Doppler. Agitated saline contrast was given intravenously to evaluate for intracardiac shunting.  LEFT VENTRICLE PLAX 2D LVIDd:         3.70 cm     Diastology LVIDs:         2.70 cm     LV e' medial:    8.38 cm/s LV PW:         0.90 cm     LV E/e' medial:  9.4 LV IVS:        0.90 cm     LV e' lateral:   9.57 cm/s LVOT diam:     1.90 cm     LV E/e' lateral: 8.2 LV SV:         51 LV SV Index:   29 LVOT Area:     2.84 cm  LV Volumes (MOD) LV vol d, MOD A2C: 71.5 ml LV vol d, MOD A4C: 84.8 ml LV vol s, MOD A2C: 19.1 ml LV vol s, MOD A4C: 27.3 ml LV SV MOD A2C:     52.4 ml LV SV MOD A4C:     84.8 ml LV SV MOD BP:      57.2 ml RIGHT VENTRICLE RV S prime:     15.30 cm/s TAPSE (M-mode): 2.1 cm LEFT ATRIUM             Index        RIGHT ATRIUM  Index LA diam:        3.40 cm 1.92 cm/m   RA Area:     16.50 cm LA Vol (A2C):   79.2 ml 44.62 ml/m  RA Volume:   40.90 ml  23.04 ml/m LA Vol (A4C):   83.7 ml 47.15 ml/m LA Biplane Vol: 84.9 ml 47.83 ml/m  AORTIC VALVE AV Area (Vmax):    1.58 cm AV Area (Vmean):   1.54 cm AV Area (VTI):     1.65 cm  AV Vmax:           159.50 cm/s AV Vmean:          106.000 cm/s AV VTI:            0.310 m AV Peak Grad:      10.2 mmHg AV Mean Grad:      5.0 mmHg LVOT Vmax:         89.10 cm/s LVOT Vmean:        57.600 cm/s LVOT VTI:          0.180 m LVOT/AV VTI ratio: 0.58  AORTA Ao Root diam: 3.20 cm Ao Asc diam:  2.90 cm MITRAL VALVE MV Area (PHT): 2.66 cm    SHUNTS MV Decel Time: 285 msec    Systemic VTI:  0.18 m MV E velocity: 78.60 cm/s  Systemic Diam: 1.90 cm MV A velocity: 92.20 cm/s MV E/A ratio:  0.85 Armanda Magic MD Electronically signed by Armanda Magic MD Signature Date/Time: 04/26/2022/4:43:50 PM    Final    US Venous Img Lower Bilateral (DVT)  Result Date: 04/26/2022 CLINICAL DATA:  Bilateral lower extremity edema EXAM: BILATERAL LOWER EXTREMITY VENOUS DOPPLER ULTRASOUND TECHNIQUE: Gray-scale sonography with graded compression, as well as color Doppler and duplex ultrasound were performed to evaluate the lower extremity deep venous systems from the level of the common femoral vein and including the common femoral, femoral, profunda femoral, popliteal and calf veins including the posterior tibial, peroneal and gastrocnemius veins when visible. The superficial great saphenous vein was also interrogated. Spectral Doppler was utilized to evaluate flow at rest and with distal augmentation maneuvers in the common femoral, femoral and popliteal veins. COMPARISON:  None Available. FINDINGS: RIGHT LOWER EXTREMITY Common Femoral Vein: No evidence of thrombus. Normal compressibility, respiratory phasicity and response to augmentation. Saphenofemoral Junction: No evidence of thrombus. Normal compressibility and flow on color Doppler imaging. Profunda Femoral Vein: No evidence of thrombus. Normal compressibility and flow on color Doppler imaging. Femoral Vein: No evidence of thrombus. Normal compressibility, respiratory phasicity and response to augmentation. Popliteal Vein: No evidence of thrombus. Normal compressibility,  respiratory phasicity and response to augmentation. Calf Veins: No evidence of thrombus. Normal compressibility and flow on color Doppler imaging. Superficial Great Saphenous Vein: No evidence of thrombus. Normal compressibility. Venous Reflux:  None. Other Findings:  None. LEFT LOWER EXTREMITY Common Femoral Vein: No evidence of thrombus. Normal compressibility, respiratory phasicity and response to augmentation. Saphenofemoral Junction: No evidence of thrombus. Normal compressibility and flow on color Doppler imaging. Profunda Femoral Vein: No evidence of thrombus. Normal compressibility and flow on color Doppler imaging. Femoral Vein: No evidence of thrombus. Normal compressibility, respiratory phasicity and response to augmentation. Popliteal Vein: No evidence of thrombus. Normal compressibility, respiratory phasicity and response to augmentation. Calf Veins: No evidence of thrombus. Normal compressibility and flow on color Doppler imaging. Superficial Great Saphenous Vein: No evidence of thrombus. Normal compressibility. Venous Reflux:  None. Other Findings:  None. IMPRESSION: No evidence of deep venous  thrombosis in either lower extremity. Electronically Signed   By: Malachy MoanHeath  McCullough M.D.   On: 04/26/2022 06:09   MR BRAIN WO CONTRAST  Result Date: 04/26/2022 CLINICAL DATA:  Initial evaluation for acute TIA. EXAM: MRI HEAD WITHOUT CONTRAST TECHNIQUE: Multiplanar, multiecho pulse sequences of the brain and surrounding structures were obtained without intravenous contrast. COMPARISON:  Prior CT from earlier the same day. FINDINGS: Brain: Cerebral volume within normal limits. Mild chronic microvascular ischemic disease for age. Small remote right parietal cortical infarct. Tiny remote left cerebellar infarct noted. 5 mm focus of restricted diffusion involving the right parietal lobe, postcentral gyrus, consistent with a tiny acute ischemic infarct (series 9, image 34). This is immediately adjacent to the  underlying chronic right parietal infarct. No associated hemorrhage or mass effect. No other evidence for acute or subacute ischemia. No acute intracranial hemorrhage. Few punctate chronic micro hemorrhages noted within the right cerebral hemisphere. No mass lesion, midline shift or mass effect. No hydrocephalus or extra-axial fluid collection. Pituitary gland and suprasellar region within normal limits. Vascular: Major intracranial vascular flow voids are maintained. Skull and upper cervical spine: Craniocervical junction level limits. Bone marrow signal intensity normal. No scalp soft tissue abnormality. Sinuses/Orbits: Globes and orbital soft tissues within normal limits. Mild scattered mucosal thickening noted about the ethmoidal air cells. Paranasal sinuses are otherwise clear. No mastoid effusion. Other: None. IMPRESSION: 1. 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 2. 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 Electronically Signed   By: Rise MuBenjamin  McClintock M.D.   On: 04/26/2022 00:30   CT HEAD WO CONTRAST (5MM)  Result Date: 04/25/2022 CLINICAL DATA:  Syncope/presyncope, cerebrovascular cause suspected EXAM: CT HEAD WITHOUT CONTRAST TECHNIQUE: Contiguous axial images were obtained from the base of the skull through the vertex without intravenous contrast. RADIATION DOSE REDUCTION: This exam was performed according to the departmental dose-optimization program which includes automated exposure control, adjustment of the mA and/or kV according to patient size and/or use of iterative reconstruction technique. COMPARISON:  06/26/2020 FINDINGS: Brain: Encephalomalacia in the right parietal lobe is new from prior exam, suggestive of interval ischemia. No acute intracranial hemorrhage. Stable degree of atrophy and periventricular chronic small vessel ischemia. 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 No evidence of  territorial ischemia. No subdural or extra-axial collection. No midline shift or mass lesion/mass effect. Vascular: No hyperdense vessel or unexpected calcification. Skull: No fracture or focal lesion. Sinuses/Orbits: Scattered mucosal thickening of ethmoid air cells. Decreased secretions in right side of sphenoid sinus. Other: None. IMPRESSION: 1. No acute intracranial abnormality. 2. 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 3. Stable atrophy and chronic small vessel ischemia. Electronically Signed   By: Narda RutherfordMelanie  Sanford M.D.   On: 04/25/2022 22:38   DG Chest Portable 1 View  Result Date: 04/25/2022 CLINICAL DATA:  Syncope.  Eval for infiltrate EXAM: PORTABLE CHEST 1 VIEW COMPARISON:  06/26/2020 FINDINGS: Reticulonodular opacities greatest in the right lung are similar to slightly increased from 06/26/2020. Question trace right pleural effusion or pleural thickening. No pneumothorax. Stable cardiomediastinal silhouette. No acute osseous abnormality. Left chest wall Port-A-Cath tip in the low SVC. Remote right rib fracture. IMPRESSION: Reticulonodular opacities in the right lung are similar to slightly increased from 06/26/2020 and suggestive of bronchopneumonia. Electronically Signed   By: Minerva Festeryler  Stutzman M.D.   On: 04/25/2022 22:13

## 2022-07-10 DIAGNOSIS — Z8673 Personal history of transient ischemic attack (TIA), and cerebral infarction without residual deficits: Secondary | ICD-10-CM | POA: Diagnosis not present

## 2022-07-10 DIAGNOSIS — G939 Disorder of brain, unspecified: Secondary | ICD-10-CM | POA: Diagnosis not present

## 2022-07-10 DIAGNOSIS — I6523 Occlusion and stenosis of bilateral carotid arteries: Secondary | ICD-10-CM | POA: Diagnosis not present

## 2022-07-10 DIAGNOSIS — R0683 Snoring: Secondary | ICD-10-CM | POA: Diagnosis not present

## 2022-07-21 DIAGNOSIS — I480 Paroxysmal atrial fibrillation: Secondary | ICD-10-CM | POA: Diagnosis not present

## 2022-07-22 ENCOUNTER — Other Ambulatory Visit: Payer: Self-pay

## 2022-07-22 MED ORDER — MIRTAZAPINE 15 MG PO TABS
15.0000 mg | ORAL_TABLET | Freq: Every evening | ORAL | 11 refills | Status: DC
  Filled 2022-07-22: qty 30, 30d supply, fill #0
  Filled 2022-08-27: qty 30, 30d supply, fill #1
  Filled 2022-09-26: qty 30, 30d supply, fill #2
  Filled 2022-10-28: qty 30, 30d supply, fill #3
  Filled 2022-12-05: qty 30, 30d supply, fill #4
  Filled 2023-01-05: qty 30, 30d supply, fill #5
  Filled 2023-01-30: qty 30, 30d supply, fill #6
  Filled 2023-03-03: qty 30, 30d supply, fill #7
  Filled 2023-04-07: qty 30, 30d supply, fill #8
  Filled 2023-05-01: qty 30, 30d supply, fill #9
  Filled 2023-06-26: qty 30, 30d supply, fill #10

## 2022-07-23 ENCOUNTER — Inpatient Hospital Stay: Payer: Medicare HMO

## 2022-07-23 ENCOUNTER — Other Ambulatory Visit: Payer: Self-pay

## 2022-07-24 DIAGNOSIS — E118 Type 2 diabetes mellitus with unspecified complications: Secondary | ICD-10-CM | POA: Diagnosis not present

## 2022-07-24 DIAGNOSIS — Z79899 Other long term (current) drug therapy: Secondary | ICD-10-CM | POA: Diagnosis not present

## 2022-07-24 DIAGNOSIS — I480 Paroxysmal atrial fibrillation: Secondary | ICD-10-CM | POA: Diagnosis not present

## 2022-07-24 DIAGNOSIS — Z0000 Encounter for general adult medical examination without abnormal findings: Secondary | ICD-10-CM | POA: Diagnosis not present

## 2022-07-24 DIAGNOSIS — E119 Type 2 diabetes mellitus without complications: Secondary | ICD-10-CM | POA: Diagnosis not present

## 2022-07-24 DIAGNOSIS — I10 Essential (primary) hypertension: Secondary | ICD-10-CM | POA: Diagnosis not present

## 2022-07-24 DIAGNOSIS — E785 Hyperlipidemia, unspecified: Secondary | ICD-10-CM | POA: Diagnosis not present

## 2022-07-25 ENCOUNTER — Other Ambulatory Visit (INDEPENDENT_AMBULATORY_CARE_PROVIDER_SITE_OTHER): Payer: Self-pay | Admitting: Nurse Practitioner

## 2022-07-25 DIAGNOSIS — I6523 Occlusion and stenosis of bilateral carotid arteries: Secondary | ICD-10-CM

## 2022-07-28 DIAGNOSIS — I10 Essential (primary) hypertension: Secondary | ICD-10-CM | POA: Diagnosis not present

## 2022-07-28 DIAGNOSIS — E118 Type 2 diabetes mellitus with unspecified complications: Secondary | ICD-10-CM | POA: Diagnosis not present

## 2022-07-28 DIAGNOSIS — I6523 Occlusion and stenosis of bilateral carotid arteries: Secondary | ICD-10-CM | POA: Diagnosis not present

## 2022-07-28 DIAGNOSIS — I2510 Atherosclerotic heart disease of native coronary artery without angina pectoris: Secondary | ICD-10-CM | POA: Diagnosis not present

## 2022-07-28 DIAGNOSIS — E782 Mixed hyperlipidemia: Secondary | ICD-10-CM | POA: Diagnosis not present

## 2022-07-29 ENCOUNTER — Ambulatory Visit (INDEPENDENT_AMBULATORY_CARE_PROVIDER_SITE_OTHER): Payer: Medicare HMO

## 2022-07-29 ENCOUNTER — Ambulatory Visit (INDEPENDENT_AMBULATORY_CARE_PROVIDER_SITE_OTHER): Payer: Medicare HMO | Admitting: Vascular Surgery

## 2022-07-29 VITALS — BP 156/75 | HR 68 | Resp 16 | Ht 65.0 in | Wt 150.0 lb

## 2022-07-29 DIAGNOSIS — I10 Essential (primary) hypertension: Secondary | ICD-10-CM | POA: Diagnosis not present

## 2022-07-29 DIAGNOSIS — E785 Hyperlipidemia, unspecified: Secondary | ICD-10-CM | POA: Diagnosis not present

## 2022-07-29 DIAGNOSIS — I6523 Occlusion and stenosis of bilateral carotid arteries: Secondary | ICD-10-CM

## 2022-07-29 DIAGNOSIS — E119 Type 2 diabetes mellitus without complications: Secondary | ICD-10-CM | POA: Diagnosis not present

## 2022-07-29 NOTE — Progress Notes (Signed)
MRN : [id number]  Blake [name] is a 77 y.o. (12-10-1945) male who presents with chief complaint of  Chief Complaint  Patient presents with   Follow-up  .  History of Present Illness: Patient returns today in follow up of his carotid disease.  He had what sounds like a syncopal episode about 3 to 4 months ago with some imaging studies performed at the hospital.  He had an MRI showing old strokes.  He had a CT angiogram which I have independently reviewed.  This is interpreted as a high-grade stenosis distal to the right ICA stent.  There is definitely some degree of narrowing distal to the stent that is likely due to the rigid stent with tortuosity distal to the stent.  We performed a duplex today to further evaluate this and there are elevated velocities in this area that would fall in the 60 to 79% range.  Left carotid stenosis.  40 to 59% range.  Current Outpatient Medications  Medication Sig Dispense Refill   aspirin 81 MG EC tablet Take 1 tablet (81 mg total) by mouth daily at 6 (six) AM. Swallow whole. 30 tablet 11   azelastine (ASTELIN) 0.1 % nasal spray Place 2 sprays into both nostrils 2 (two) times daily. Use in each nostril as directed 30 mL 2   clopidogrel (PLAVIX) 75 MG tablet Take 1 tablet (75 mg total) by mouth daily. 30 tablet 6   cyanocobalamin 1000 MCG tablet Take 1 tablet (1,000 mcg total) by mouth daily. 90 tablet 0   diltiazem (CARDIZEM CD) 180 MG 24 hr capsule Take 1 capsule (180 mg total) by mouth daily. Please hold until you see your cardiologist 90 capsule 3   erythromycin ophthalmic ointment Place 1 Application into the right eye 3 (three) times daily for 10 days. 3.5 g 0   ferrous sulfate 325 (65 FE) MG tablet Take 1 tablet (325 mg total) by mouth 2 (two) times daily with a meal. 60 tablet 3   ibuprofen (ADVIL) 800 MG tablet Take 1 tablet (800 mg total) by mouth every 8 (eight) hours as needed for pain. 60 tablet 1   lisinopril (ZESTRIL) 10 MG tablet Take 1 tablet  (10 mg total) by mouth daily. 30 tablet 3   methocarbamol (ROBAXIN) 500 MG tablet Take 1 tablet (500 mg total) by mouth every 6 (six) hours as needed. 90 tablet 1   metoprolol succinate (TOPROL-XL) 100 MG 24 hr tablet Take 2 tablets (200 mg total) by mouth once daily 180 tablet 1   mirtazapine (REMERON) 15 MG tablet Take 1 tablet (15 mg total) by mouth Nightly. 30 tablet 11   omeprazole (PRILOSEC) 40 MG capsule Take 1 capsule (40 mg total) by mouth once daily 90 capsule 3   rosuvastatin (CRESTOR) 10 MG tablet Take 1 tablet (10 mg total) by mouth once daily 90 tablet 3   tamsulosin (FLOMAX) 0.4 MG CAPS capsule TAKE 1 CAPSULE BY MOUTH ONCE DAILY. TAKE 30 MINUTES AFTER SAME MEAL EACH DAY. 90 capsule 3   Tiotropium Bromide Monohydrate (SPIRIVA RESPIMAT) 2.5 MCG/ACT AERS Inhale 2 puffs into the lungs daily. (Patient not taking: Reported on 06/02/2022) 4 g 1   No current facility-administered medications for this visit.    Past Medical History:  Diagnosis Date   Arthritis    Back pain    with leg pain   Basal cell carcinoma 09/04/2009   R cheek 5.5 cm ant to earlobe - 12/26/2009 excision   Basal cell  carcinoma 09/04/2009   R ant nasal alar rim   Basal cell carcinoma 07/31/2014   R mid brow   Basal cell carcinoma 07/30/2016   R ant nasal alar rim at ant edge of BCC scar   Basal cell carcinoma 11/02/2019   L zygoma    Basal cell carcinoma 07/22/2021   left sideburn area, [facility]   Basal cell carcinoma 01/02/2022   Right posterior shoulder, [facility]   Basal cell carcinoma 01/02/2022   R posterior shoulder, [facility]   Basal cell carcinoma 01/02/2022   Right Supraclavicular Base of Neck, [facility]   Cancer of base of tongue (HCC) 2006   s/p chemoradiation   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, bilateral    Dyspnea    Dysrhythmia    Esophageal cancer (HCC) 2021   GERD (gastroesophageal reflux disease)    Hyperlipidemia    Hypertension    Numbness and tingling of both lower extremities     with positioning   Paroxysmal atrial fibrillation (HCC)    Port-A-Cath in place 2006   Prostate enlargement    Squamous cell carcinoma of skin 12/14/2019   L pretibial - ED&C    T2DM (type 2 diabetes mellitus) (HCC)     Past Surgical History:  Procedure Laterality Date   ABDOMINAL SURGERY     CAROTID PTA/STENT INTERVENTION Right 07/04/2021   Procedure: CAROTID PTA/STENT INTERVENTION;  Surgeon: Annice Needy, MD;  Location: [facility];  Service: Cardiovascular;  Laterality: Right;   COLONOSCOPY  12/08/2003   COLONOSCOPY WITH PROPOFOL N/A 03/21/2019   Procedure: COLONOSCOPY WITH PROPOFOL;  Surgeon: Toledo, Boykin Nearing, MD;  Location: [facility];  Service: Gastroenterology;  Laterality: N/A;   ESOPHAGOGASTRODUODENOSCOPY (EGD) WITH PROPOFOL N/A 03/21/2019   Procedure: ESOPHAGOGASTRODUODENOSCOPY (EGD) WITH PROPOFOL;  Surgeon: Toledo, Boykin Nearing, MD;  Location: [facility];  Service: Gastroenterology;  Laterality: N/A;   esophogeal cancer     JOINT REPLACEMENT Right    total hip   LUMBAR LAMINECTOMY/DECOMPRESSION MICRODISCECTOMY N/A 03/03/2018   Procedure: LUMBAR LAMINECTOMY/DECOMPRESSION MICRODISCECTOMY 1 LEVEL-L3-4,L4-5;  Surgeon: Venetia Night, MD;  Location: [facility];  Service: Neurosurgery;  Laterality: N/A;   LUMBAR LAMINECTOMY/DECOMPRESSION MICRODISCECTOMY N/A 02/24/2020   Procedure: LEFT L3-4 MICRODISCECTOMY, L4-5 DECOMPRESSION;  Surgeon: Venetia Night, MD;  Location: [facility];  Service: Neurosurgery;  Laterality: N/A;   MAXIMUM ACCESS (MAS) TRANSFORAMINAL LUMBAR INTERBODY FUSION (TLIF) 1 LEVEL N/A 07/13/2020   Procedure: OPEN L3-4 TRANSFORAMINAL LUMBAR INTERBODY FUSION (TLIF);  Surgeon: Venetia Night, MD;  Location: [facility];  Service: Neurosurgery;  Laterality: N/A;   PARTIAL HIP ARTHROPLASTY Right 2014   PORT-A-CATH REMOVAL     PORTACATH PLACEMENT Left 01/12/2020   Procedure: INSERTION PORT-A-CATH, with ultrasound fluoroscopy;  Surgeon: Hulda Marin, MD;   Location: [facility];  Service: General;  Laterality: Left;   TONGUE BIOPSY  2006   TONSILLECTOMY     TRIGGER FINGER RELEASE Right 2013   UPPER GI ENDOSCOPY  01/25/14   multiple gastric polyps     Social History   Tobacco Use   Smoking status: Former    Packs/day: 1.00    Years: 30.00    Additional pack years: 0.00    Total pack years: 30.00    Types: Cigarettes    Quit date: 02/18/1994    Years since quitting: 28.4   Smokeless tobacco: Never  Vaping Use   Vaping Use: Never used  Substance Use Topics   Alcohol use: Not Currently   Drug use: No  Family History  Problem Relation Age of Onset   Diabetes Mother    Congestive Heart Failure Mother    Breast cancer Mother    Lung cancer Father      Allergies  Allergen Reactions   Sulfa Antibiotics Rash    REVIEW OF SYSTEMS (Negative unless checked)   Constitutional: [] Weight loss  [] Fever  [] Chills Cardiac: [] Chest pain   [] Chest pressure   [x] Palpitations   [] Shortness of breath when laying flat   [] Shortness of breath at rest   [] Shortness of breath with exertion. Vascular:  [] Pain in legs with walking   [] Pain in legs at rest   [] Pain in legs when laying flat   [] Claudication   [] Pain in feet when walking  [] Pain in feet at rest  [] Pain in feet when laying flat   [] History of DVT   [] Phlebitis   [] Swelling in legs   [] Varicose veins   [] Non-healing ulcers Pulmonary:   [] Uses home oxygen   [] Productive cough   [] Hemoptysis   [] Wheeze  [] COPD   [] Asthma Neurologic:  [] Dizziness  [x] Blackouts   [] Seizures   [x] History of stroke   [] History of TIA  [] Aphasia   [] Temporary blindness   [] Dysphagia   [] Weakness or numbness in arms   [] Weakness or numbness in legs Musculoskeletal:  [x] Arthritis   [] Joint swelling   [] Joint pain   [x] Low back pain Hematologic:  [] Easy bruising  [] Easy bleeding   [] Hypercoagulable state   [] Anemic  [] Hepatitis Gastrointestinal:  [] Blood in stool   [] Vomiting blood  [x] Gastroesophageal  reflux/heartburn   [] Difficulty swallowing. Genitourinary:  [] Chronic kidney disease   [] Difficult urination  [x] Frequent urination  [] Burning with urination   [] Blood in urine Skin:  [] Rashes   [] Ulcers   [] Wounds Psychological:  [] History of anxiety   []  History of major depression.  Physical Examination  BP (!) 156/75 (BP Location: Left Arm)   Pulse 68   Resp 16   Ht 5\' 5"  (1.651 m)   Wt 150 lb (68 kg)   BMI 24.96 kg/m  Gen:  WD/WN, NAD Head: [place]/AT, No temporalis wasting. Ear/Nose/Throat: Hearing grossly intact, nares w/o erythema or drainage Eyes: Conjunctiva clear. Sclera non-icteric Neck: Supple.  Trachea midline Pulmonary:  Good air movement, no use of accessory muscles.  Cardiac: RRR, no JVD Vascular:  Vessel Right Left  Radial Palpable Palpable               Musculoskeletal: M/S 5/5 throughout.  No deformity or atrophy. No edema. Neurologic: Sensation grossly intact in extremities.  Symmetrical.  Speech is fluent.  Psychiatric: Judgment intact, Mood & affect appropriate for pt's clinical situation. Dermatologic: No rashes or ulcers noted.  No cellulitis or open wounds.      Labs Recent Results (from the past 2160 hour(s))  Ferritin     Status: Abnormal   Collection Time: 06/02/22  1:02 PM  Result Value Ref Range   Ferritin 10 (L) 24 - 336 ng/mL    Comment: Performed at [facility], [address] [place], [place] [address]  Iron and TIBC     Status: Abnormal   Collection Time: 06/02/22  1:02 PM  Result Value Ref Range   Iron 271 (H) 45 - 182 ug/dL   TIBC 604 540 - 981 ug/dL   Saturation Ratios 61 (H) 17.9 - 39.5 %   UIBC 176 ug/dL    Comment: Performed at [facility], [address], [place], [place] [address]  CEA  Status: None   Collection Time: 06/02/22  1:02 PM  Result Value Ref Range   CEA 3.0 0.0 - 4.7 ng/mL    Comment: (NOTE)                             Nonsmokers          <3.9                             Smokers              <5.6 [employer] Electrochemiluminescence Immunoassay (ECLIA) Values obtained with different assay methods or kits cannot be used interchangeably.  Results cannot be interpreted as absolute evidence of the presence or absence of malignant disease. Performed At: [facility] [address] [place], [place] [id number] Jolene Schimke MD YN:[id number]   Comprehensive metabolic panel     Status: Abnormal   Collection Time: 06/02/22  1:02 PM  Result Value Ref Range   Sodium 139 135 - 145 mmol/L   Potassium 4.5 3.5 - 5.1 mmol/L   Chloride 108 98 - 111 mmol/L   CO2 26 22 - 32 mmol/L   Glucose, Bld 145 (H) 70 - 99 mg/dL    Comment: Glucose reference range applies only to samples taken after fasting for at least 8 hours.   BUN 19 8 - 23 mg/dL   Creatinine, Ser 6.57 0.61 - 1.24 mg/dL   Calcium 8.4 (L) 8.9 - 10.3 mg/dL   Total Protein 6.6 6.5 - 8.1 g/dL   Albumin 3.7 3.5 - 5.0 g/dL   AST 26 15 - 41 U/L   ALT 23 0 - 44 U/L   Alkaline Phosphatase 70 38 - 126 U/L   Total Bilirubin 0.5 0.3 - 1.2 mg/dL   GFR, Estimated >84 >69 mL/min    Comment: (NOTE) Calculated using the CKD-EPI Creatinine Equation (2021)    Anion gap 5 5 - 15    Comment: Performed at [facility], [address] [place], [place] [address]  CBC with Differential/Platelet     Status: Abnormal   Collection Time: 06/02/22  1:02 PM  Result Value Ref Range   WBC 8.3 4.0 - 10.5 K/uL   RBC 4.88 4.22 - 5.81 MIL/uL   Hemoglobin 12.1 (L) 13.0 - 17.0 g/dL   HCT 84.1 32.4 - 40.1 %   MCV 83.0 80.0 - 100.0 fL   MCH 24.8 (L) 26.0 - 34.0 pg   MCHC 29.9 (L) 30.0 - 36.0 g/dL   RDW 02.7 (H) 25.3 - 66.4 %   Platelets 259 150 - 400 K/uL   nRBC 0.0 0.0 - 0.2 %   Neutrophils Relative % 80 %   Neutro Abs 6.7 1.7 - 7.7 K/uL   Lymphocytes Relative 10 %   Lymphs Abs 0.9 0.7 - 4.0 K/uL   Monocytes Relative 7 %   Monocytes Absolute 0.5 0.1 - 1.0 K/uL   Eosinophils Relative 1 %   Eosinophils Absolute 0.1 0.0 -  0.5 K/uL   Basophils Relative 1 %   Basophils Absolute 0.0 0.0 - 0.1 K/uL   Immature Granulocytes 1 %   Abs Immature Granulocytes 0.04 0.00 - 0.07 K/uL    Comment: Performed at [facility], [address], [place], [place] [address]  CEA     Status: None   Collection Time: 07/03/22 12:05 PM  Result Value Ref Range  CEA 2.7 0.0 - 4.7 ng/mL    Comment: (NOTE)                             Nonsmokers          <3.9                             Smokers             <5.6 [employer] Electrochemiluminescence Immunoassay (ECLIA) Values obtained with different assay methods or kits cannot be used interchangeably.  Results cannot be interpreted as absolute evidence of the presence or absence of malignant disease. Performed At: [facility] [address] [place], [place] [id number] Jolene Schimke MD WU:[id number]   Ferritin     Status: None   Collection Time: 07/03/22 12:05 PM  Result Value Ref Range   Ferritin 31 24 - 336 ng/mL    Comment: Performed at [facility], [address] [place], [place] [address]  Iron and TIBC     Status: None   Collection Time: 07/03/22 12:05 PM  Result Value Ref Range   Iron 68 45 - 182 ug/dL   TIBC 308 657 - 846 ug/dL   Saturation Ratios 18 17.9 - 39.5 %   UIBC 318 ug/dL    Comment: Performed at [facility], [address] [place], [place] [address]  Comprehensive metabolic panel     Status: Abnormal   Collection Time: 07/03/22 12:05 PM  Result Value Ref Range   Sodium 136 135 - 145 mmol/L   Potassium 4.1 3.5 - 5.1 mmol/L   Chloride 104 98 - 111 mmol/L   CO2 24 22 - 32 mmol/L   Glucose, Bld 147 (H) 70 - 99 mg/dL    Comment: Glucose reference range applies only to samples taken after fasting for at least 8 hours.   BUN 14 8 - 23 mg/dL   Creatinine, Ser 2.84 0.61 - 1.24 mg/dL   Calcium 8.4 (L) 8.9 - 10.3 mg/dL   Total Protein 6.6 6.5 - 8.1 g/dL   Albumin 3.8 3.5 - 5.0 g/dL   AST 43 (H) 15 - 41 U/L   ALT  38 0 - 44 U/L   Alkaline Phosphatase 76 38 - 126 U/L   Total Bilirubin 0.6 0.3 - 1.2 mg/dL   GFR, Estimated >13 >24 mL/min    Comment: (NOTE) Calculated using the CKD-EPI Creatinine Equation (2021)    Anion gap 8 5 - 15    Comment: Performed at [facility], [address] [place], [place] [address]  CBC with Differential/Platelet     Status: Abnormal   Collection Time: 07/03/22 12:05 PM  Result Value Ref Range   WBC 8.0 4.0 - 10.5 K/uL   RBC 5.23 4.22 - 5.81 MIL/uL   Hemoglobin 13.8 13.0 - 17.0 g/dL   HCT 72.5 36.6 - 44.0 %   MCV 84.9 80.0 - 100.0 fL   MCH 26.4 26.0 - 34.0 pg   MCHC 31.1 30.0 - 36.0 g/dL   RDW 34.7 (H) 42.5 - 95.6 %   Platelets 214 150 - 400 K/uL   nRBC 0.0 0.0 - 0.2 %   Neutrophils Relative % 78 %   Neutro Abs 6.3 1.7 - 7.7 K/uL   Lymphocytes Relative 11 %   Lymphs Abs 0.8 0.7 - 4.0 K/uL   Monocytes Relative 8 %  Monocytes Absolute 0.7 0.1 - 1.0 K/uL   Eosinophils Relative 2 %   Eosinophils Absolute 0.2 0.0 - 0.5 K/uL   Basophils Relative 1 %   Basophils Absolute 0.0 0.0 - 0.1 K/uL   Immature Granulocytes 0 %   Abs Immature Granulocytes 0.03 0.00 - 0.07 K/uL    Comment: Performed at [facility], [address], [place], [place] [address]    Radiology CT CHEST ABDOMEN PELVIS W CONTRAST  Result Date: 07/04/2022 CLINICAL DATA:  77 year old male with history of esophageal cancer status post surgical resection. Follow-up study. * Tracking Code: BO * EXAM: CT CHEST, ABDOMEN, AND PELVIS WITH CONTRAST TECHNIQUE: Multidetector CT imaging of the chest, abdomen and pelvis was performed following the standard protocol during bolus administration of intravenous contrast. RADIATION DOSE REDUCTION: This exam was performed according to the departmental dose-optimization program which includes automated exposure control, adjustment of the mA and/or kV according to patient size and/or use of iterative reconstruction technique. CONTRAST:  OMNIPAQUE  IOHEXOL 300 MG/ML  SOLN COMPARISON:  CT of the chest, abdomen and pelvis 12/30/2021. FINDINGS: CT CHEST FINDINGS Cardiovascular: Heart size is normal. There is no significant pericardial fluid, thickening or pericardial calcification. Atherosclerosis in the thoracic aorta. No definite coronary artery calcifications identified. Left subclavian single-lumen Port-A-Cath with tip terminating in the distal superior vena cava. Mediastinum/Nodes: No pathologically enlarged mediastinal or hilar lymph nodes. Status post esophagectomy and gastric pull-through. No axillary lymphadenopathy. Lungs/Pleura: Again noted is widespread peribronchovascular predominant micro and macronodularity throughout the right lung, similar to numerous prior examinations, presumably sequela of chronic indolent atypical infection with areas of chronic mucoid impaction within terminal bronchioles and distal bronchi. Left lung is clear. No pleural effusions. Musculoskeletal: There are no aggressive appearing lytic or blastic lesions noted in the visualized portions of the skeleton. CT ABDOMEN PELVIS FINDINGS Hepatobiliary: No suspicious cystic or solid hepatic lesions. No intra or extrahepatic biliary ductal dilatation. Gallbladder is unremarkable in appearance. Pancreas: No pancreatic mass. No pancreatic ductal dilatation. No pancreatic or peripancreatic fluid collections or inflammatory changes. Spleen: Unremarkable. Adrenals/Urinary Tract: Multifocal cortical thinning in the kidneys bilaterally (left-greater-than-right), indicative of areas of chronic post infectious or inflammatory scarring. Subcentimeter low-attenuation lesions in both kidneys, too small to definitively characterize, but similar to prior studies and statistically likely to represent tiny cysts. 2 mm nonobstructive calculus in the interpolar collecting system of the left kidney. No hydroureteronephrosis. Urinary bladder is largely obscured by beam hardening artifact from the  patient's right hip arthroplasty, but visualized portions are unremarkable in appearance. Bilateral adrenal glands are unremarkable in appearance. Stomach/Bowel: No pathologic dilatation of small bowel or colon. Scattered colonic diverticuli are noted, without surrounding inflammatory changes to indicate an acute diverticulitis at this time. Normal appendix. Vascular/Lymphatic: Aortic atherosclerosis, without evidence of aneurysm or dissection in the abdominal or pelvic vasculature. No lymphadenopathy is noted in the abdomen or pelvis. Reproductive: Prostate gland and seminal vesicles are largely obscured by beam hardening artifact from the patient's right hip arthroplasty. Other: Well-defined mass-like lesion in the left upper quadrant of the abdomen, similar to prior studies (axial image 51 of series 2), currently measuring 6.8 x 3.3 cm, most compatible with a chronic area of fat necrosis based on comparison with numerous prior examinations. No significant volume of ascites. No pneumoperitoneum. Musculoskeletal: There are no aggressive appearing lytic or blastic lesions noted in the visualized portions of the skeleton. Status post right hip arthroplasty. Status post PLIF at L3-L4 with interbody cage at L3-L4 interspace. IMPRESSION:  1. Status post esophagectomy and gastric pull-through with no definitive imaging findings to suggest metastatic disease in the chest, abdomen or pelvis. 2. Widespread nodularity throughout the right lung very similar to numerous prior examinations, presumably sequela of chronic atypical infection with widespread areas of mucoid impaction. 3. 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 4. Aortic atherosclerosis. 5. Additional incidental findings, as above. Electronically Signed   By: Trudie Reed M.D.   On: 07/04/2022 08:58    Assessment/Plan Essential (primary) hypertension blood pressure control important in reducing the progression of atherosclerotic  disease. On appropriate oral medications.     Diabetes mellitus (HCC) blood glucose control important in reducing the progression of atherosclerotic disease. Also, involved in wound healing. On appropriate medications.     HLD (hyperlipidemia) lipid control important in reducing the progression of atherosclerotic disease. Continue statin therapy  Carotid stenosis We performed a duplex today to further evaluate this and there are elevated velocities in this area that would fall in the 60 to 79% range.  Left carotid stenosis.  40 to 59% range. Had a long discussion with the patient today regarding options.  After reviewing his CT scan and the duplex, there is definitely some narrowing distal to the stent.  At this point, I have given him 2 options.  We could perform a short interval follow-up with 2 to 3 months with duplex and if there is progression it would likely then need intervention.  The other option would be to proceed with an angiogram at this point with intent to treat if we do see greater than 75% stenosis.  He will continue his dual antiplatelet therapy and statin agent either way.  At this time, the patient has an upcoming trip next month and would prefer a short interval follow-up with carotid duplex in 3 months.  He will seek immediate medical attention should he develop any focal neurologic symptoms.    Festus Barren, MD  07/29/2022 2:17 PM    This note was created with Dragon medical transcription system.  Any errors from dictation are purely unintentional

## 2022-07-29 NOTE — Assessment & Plan Note (Signed)
We performed a duplex today to further evaluate this and there are elevated velocities in this area that would fall in the 60 to 79% range.  Left carotid stenosis.  40 to 59% range. Had a long discussion with the patient today regarding options.  After reviewing his CT scan and the duplex, there is definitely some narrowing distal to the stent.  At this point, I have given him 2 options.  We could perform a short interval follow-up with 2 to 3 months with duplex and if there is progression it would likely then need intervention.  The other option would be to proceed with an angiogram at this point with intent to treat if we do see greater than 75% stenosis.  He will continue his dual antiplatelet therapy and statin agent either way.  At this time, the patient has an upcoming trip next month and would prefer a short interval follow-up with carotid duplex in 3 months.  He will seek immediate medical attention should he develop any focal neurologic symptoms.

## 2022-07-31 ENCOUNTER — Other Ambulatory Visit: Payer: Self-pay

## 2022-08-20 ENCOUNTER — Ambulatory Visit: Payer: Medicare HMO | Attending: Neurology | Admitting: Occupational Therapy

## 2022-08-20 DIAGNOSIS — I6340 Cerebral infarction due to embolism of unspecified cerebral artery: Secondary | ICD-10-CM | POA: Diagnosis not present

## 2022-08-20 DIAGNOSIS — R278 Other lack of coordination: Secondary | ICD-10-CM | POA: Insufficient documentation

## 2022-08-20 DIAGNOSIS — M6281 Muscle weakness (generalized): Secondary | ICD-10-CM | POA: Insufficient documentation

## 2022-08-20 NOTE — Therapy (Signed)
OUTPATIENT OCCUPATIONAL THERAPY NEURO EVALUATION  Patient Name: Blake [name] MRN: [id number] DOB:02-Aug-1945, 77 y.o., male Today's Date: 08/20/2022  PCP: Dr. Judithann Sheen, MD REFERRING PROVIDER: Dr. Cristopher Peru, MD  END OF SESSION:  OT End of Session - 08/20/22 1605     Visit Number 1    Number of Visits 24    Date for OT Re-Evaluation 11/12/22    OT Start Time 1306    OT Stop Time 1345    OT Time Calculation (min) 39 min    Activity Tolerance Patient tolerated treatment well    Behavior During Therapy [facility] for tasks assessed/performed             Past Medical History:  Diagnosis Date   Arthritis    Back pain    with leg pain   Basal cell carcinoma 09/04/2009   R cheek 5.5 cm ant to earlobe - 12/26/2009 excision   Basal cell carcinoma 09/04/2009   R ant nasal alar rim   Basal cell carcinoma 07/31/2014   R mid brow   Basal cell carcinoma 07/30/2016   R ant nasal alar rim at ant edge of BCC scar   Basal cell carcinoma 11/02/2019   L zygoma    Basal cell carcinoma 07/22/2021   left sideburn area, EDC   Basal cell carcinoma 01/02/2022   Right posterior shoulder, EDC   Basal cell carcinoma 01/02/2022   R posterior shoulder, EDC   Basal cell carcinoma 01/02/2022   Right Supraclavicular Base of Neck, EDC   Cancer of base of tongue (HCC) 2006   s/p chemoradiation   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, bilateral    Dyspnea    Dysrhythmia    Esophageal cancer (HCC) 2021   GERD (gastroesophageal reflux disease)    Hyperlipidemia    Hypertension    Numbness and tingling of both lower extremities    with positioning   Paroxysmal atrial fibrillation (HCC)    Port-A-Cath in place 2006   Prostate enlargement    Squamous cell carcinoma of skin 12/14/2019   L pretibial - ED&C    T2DM (type 2 diabetes mellitus) (HCC)    Past Surgical History:  Procedure Laterality Date   ABDOMINAL SURGERY     CAROTID PTA/STENT INTERVENTION Right 07/04/2021   Procedure:  CAROTID PTA/STENT INTERVENTION;  Surgeon: Annice Needy, MD;  Location: [facility] INVASIVE [facility];  Service: Cardiovascular;  Laterality: Right;   COLONOSCOPY  12/08/2003   COLONOSCOPY WITH PROPOFOL N/A 03/21/2019   Procedure: COLONOSCOPY WITH PROPOFOL;  Surgeon: Toledo, Boykin Nearing, MD;  Location: [facility] ENDOSCOPY;  Service: Gastroenterology;  Laterality: N/A;   ESOPHAGOGASTRODUODENOSCOPY (EGD) WITH PROPOFOL N/A 03/21/2019   Procedure: ESOPHAGOGASTRODUODENOSCOPY (EGD) WITH PROPOFOL;  Surgeon: Toledo, Boykin Nearing, MD;  Location: [facility] ENDOSCOPY;  Service: Gastroenterology;  Laterality: N/A;   esophogeal cancer     JOINT REPLACEMENT Right    total hip   LUMBAR LAMINECTOMY/DECOMPRESSION MICRODISCECTOMY N/A 03/03/2018   Procedure: LUMBAR LAMINECTOMY/DECOMPRESSION MICRODISCECTOMY 1 LEVEL-L3-4,L4-5;  Surgeon: Venetia Night, MD;  Location: [facility] ORS;  Service: Neurosurgery;  Laterality: N/A;   LUMBAR LAMINECTOMY/DECOMPRESSION MICRODISCECTOMY N/A 02/24/2020   Procedure: LEFT L3-4 MICRODISCECTOMY, L4-5 DECOMPRESSION;  Surgeon: Venetia Night, MD;  Location: [facility] ORS;  Service: Neurosurgery;  Laterality: N/A;   MAXIMUM ACCESS (MAS) TRANSFORAMINAL LUMBAR INTERBODY FUSION (TLIF) 1 LEVEL N/A 07/13/2020   Procedure: OPEN L3-4 TRANSFORAMINAL LUMBAR INTERBODY FUSION (TLIF);  Surgeon: Venetia Night, MD;  Location: [facility] ORS;  Service: Neurosurgery;  Laterality: N/A;   PARTIAL HIP  ARTHROPLASTY Right 2014   PORT-A-CATH REMOVAL     PORTACATH PLACEMENT Left 01/12/2020   Procedure: INSERTION PORT-A-CATH, with ultrasound fluoroscopy;  Surgeon: Hulda Marin, MD;  Location: [facility] ORS;  Service: General;  Laterality: Left;   TONGUE BIOPSY  2006   TONSILLECTOMY     TRIGGER FINGER RELEASE Right 2013   UPPER GI ENDOSCOPY  01/25/14   multiple gastric polyps   Patient Active Problem List   Diagnosis Date Noted   Hypocalcemia 06/02/2022   IDA (iron deficiency anemia) 05/29/2022   Loss of consciousness (HCC) 04/25/2022    Paresthesia 04/25/2022   Encephalomalacia on imaging study 04/25/2022   Positive D dimer 04/25/2022   Vasovagal syncope 04/25/2022   Acidosis 04/25/2022   Sinus bradycardia 04/25/2022   Anemia 04/25/2022   Paraseptal emphysema (HCC) 02/24/2022   Lung nodules 01/01/2022   Carotid stenosis, right 07/04/2021   Paroxysmal A-fib (HCC) 02/26/2021   Spondylolisthesis 07/13/2020   Chest pain 06/26/2020   Hypertensive urgency 06/26/2020   Shortness of breath 06/26/2020   Pneumonia 01/17/2020   Non-intractable vomiting 12/26/2019   Esophageal carcinoma (HCC) 12/09/2019   History of head and neck cancer 12/09/2019   Chronic pain syndrome 12/08/2019   History of lumbar laminectomy 12/08/2019   Supraventricular tachycardia 12/08/2019   Acute postoperative pain 09/13/2019   Malignant neoplasm of lower third of esophagus (HCC) 07/14/2019   Goals of care, counseling/discussion 05/04/2019   Osteoarthritis 04/26/2019   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   Bilateral hand numbness 12/16/2018   Bilateral hand pain 12/16/2018   Lumbar radiculopathy 06/02/2017   Lumbar degenerative disc disease 06/02/2017   Spinal stenosis, lumbar region, 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   Primary osteoarthritis of right shoulder 07/01/2016   Bilateral carotid artery stenosis 06/05/2016   Carotid stenosis 04/22/2016   Allergic rhinitis 08/24/2015   Diabetes mellitus (HCC) 08/24/2015   Personal history of malignant neoplasm of tongue 08/24/2015   HLD (hyperlipidemia) 08/24/2015   Gastric polyposis 08/24/2015   Stenosing tenosynovitis of finger 07/11/2014   Coronary artery disease 06/26/2014   Family history of cancer of digestive system 10/20/2006   Deficiency, 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 Reports that he will be out of town next week. Pt accompanied by: self  PERTINENT HISTORY:   Pt. 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 Pt.'s PMHx includes: Right carotid artery stenosis with stenting with further stenosis around the current stents, Esophageal CA, Type2 DM, Chronic back pain with  L3-L4, L5 Lumbar Decompression. Pt. Has a heart monitor placed.  PRECAUTIONS: None  WEIGHT BEARING RESRICTIONS: No  PAIN:  Are you having pain?  Yes, 4/10  chronic back pain  FALLS: Has patient fallen in last 6 months? No  LIVING ENVIRONMENT: Lives with: lives alone Lives in: House/apartment Stairs: level entry, 2 story Has following equipment at home: Single point cane, Environmental consultant - 2 wheeled, Environmental consultant - 4 wheeled, shower chair, Shower bench, and Grab bars  PLOF: Independent  PATIENT GOALS: To get his left hand back  OBJECTIVE:   HAND DOMINANCE: Right  ADLs:  Transfers/ambulation related to ADLs: Eating: Independent, modified technique for cutting food Grooming: Independent  UB Dressing:  Independent LB Dressing: Increased time to tie tennis shoes Toileting: Independent Bathing: Independent Tub Shower transfers:  Independent with walkin shower   IADLs: Shopping: Independent Light housekeeping:  Pt. Has a personal housekeeper Meal Prep: Minimal cooking(At baseline), able to do light meal prep. Medication management: Independent Financial management:  No change Handwriting:  Uses his dominant right hand  MOBILITY STATUS: Independent   Activity tolerance: Intact  FUNCTIONAL OUTCOME MEASURES: FOTO: 71 TR score: 70  UPPER EXTREMITY ROM:    Active ROM Right Eval WFL Left Eval [facility]  Shoulder flexion    Shoulder abduction     Shoulder adduction    Shoulder extension    Shoulder internal rotation    Shoulder external rotation    Elbow flexion    Elbow extension    Wrist flexion    Wrist extension    Wrist ulnar deviation    Wrist radial deviation    Wrist pronation    Wrist supination    (Blank rows = not tested)  UPPER EXTREMITY MMT:     MMT Right eval Left eval  Shoulder flexion 4+ 4+  Shoulder abduction 4+ 4+  Shoulder adduction    Shoulder extension    Shoulder internal rotation    Shoulder external rotation    Middle trapezius    Lower trapezius    Elbow flexion 5 4+  Elbow extension 5 4+  Wrist flexion 5 4  Wrist extension 5 4  Wrist ulnar deviation    Wrist radial deviation    Wrist pronation 5 4  Wrist supination 5 4  (Blank rows = not tested)  HAND FUNCTION: Grip strength: Right: 59 lbs; Left: 46 lbs, Lateral pinch: Right: 19 lbs, Left: 20 lbs, and 3 point pinch: Right: 15 lbs, Left: 14 lbs  COORDINATION: 9 Hole Peg test: Right: 31 sec; Left: 45 sec  SENSATION: Positive numbness, and tingling Light touch: WFL Proprioception: WFL   EDEMA: N/A  MUSCLE TONE: Intact  COGNITION: Overall cognitive status: Within functional limits for tasks assessed  VISION: Subjective report:  Reports  increased  blurriness with distance, most notably  with driving  Pt. Reports having an eye exam scheduled VISION ASSESSMENT: To be further assessed in functional context  PERCEPTION: WFL  PRAXIS: Impaired: Motor planning   TODAY'S TREATMENT:                                                                                                                              DATE: 08/20/2022  Pt. Participated in the initial evaluation   PATIENT EDUCATION: Education details:  OT services, POC, goals Person educated: Patient Education method: Explanation, Demonstration, and Verbal cues Education comprehension: verbalized understanding, returned demonstration, and needs further  education  HOME EXERCISE PROGRAM:  To continue to assess, and provide as needed.   GOALS: Goals reviewed with patient? Yes  SHORT TERM GOALS: Target date: 10/01/2022  1. Pt. will increase FOTO score by 2 points to reflect Pt. perceived improvement with assessment specific ADL/IADL's.   Baseline: Eval: FOTO score: 71 with TR score: 70 Goal status: INITIAL  2.  Pt. Will be independent with HEPs of the LUE. Baseline: Eval: No current HEP Goal status: INITIAL  LONG TERM GOALS: Target date: 11/12/2022    Pt. Will increase LUE strength by 2 mm grades to assist with ADLs, and IADLs Baseline: Eval: Left shoulder flexion: 4+, abduction: 4+, elbow flexion: 4+, extension:4+, forearm supination:4 , wrist extension: 4 Goal status: INITIAL  2.  Pt. Will increase left grip strength by 5# to be able to securely hold objects, and open containers, or jars. Baseline: Eval: Right: 59 Left: 46 Goal status: INITIAL  3.  Pt. Will improve left pinch strength to be able to be able to push his belt through his pant loop independently Baseline: Eval: Pinch strength: lateral: right: 19# left: 20#, 3pt.: Right: 15#, Left:14# Goal status: INITIAL  4.  Pt. Will improve left hand [facility] skills by 3 sec. Of speed to be able to button his shirt efficiently Baseline: Eval:  9 hole peg test: Right:  31 sec., Left: 45 sec. Goal status: INITIAL  5.  Pt. Will Pt. Will consistently, and efficiently reach for targets with the left UE with 100% accuracy. Baseline: Eval: Pt. presents with decreased motor control affecting the accuracy of reaching for objects with the left hand when challenged with speed. Goal status: INITIAL     ASSESSMENT:  CLINICAL IMPRESSION Patient is a 77  y.o. male who was seen today for occupational therapy evaluation for CVA. Pt. presents with numbness/tingling in the left hand, decreased LUE strength, grip strength, pinch strength, motor control, and [facility] skills which limit his  ability to complete daily ADL, and IADL tasks. Pt. Presents with difficulty buttoning his shirts efficiently, and looping his belt through his pants. Pt.  Frequently Misses targets with the left hand when further challenged with speed, and requires constant visual attention when performing tasks with his left hand. Pt.'s FOTO score is 71 with the TR  score of 70.  Pt. Will benefit from skilled OT services to work on improving left hand function in order to increase engagement in, and maximize independence with ADLs, and IADL tasks.   PERFORMANCE DEFICITS: in functional skills including ADLs, IADLs, coordination, dexterity, proprioception, ROM, strength, and pain, cognitive skills including , and psychosocial skills including coping strategies and environmental adaptation.   IMPAIRMENTS: are limiting patient from ADLs, IADLs, and leisure.   CO-MORBIDITIES: may have co-morbidities  that affects occupational performance. Patient will benefit from skilled OT to address above impairments and improve overall function.  MODIFICATION OR ASSISTANCE TO COMPLETE EVALUATION: Min-Moderate modification of tasks or assist with assess necessary to complete an evaluation.  OT OCCUPATIONAL PROFILE AND HISTORY: Detailed assessment: Review of records and additional review of physical, cognitive, psychosocial history related to current functional performance.  CLINICAL DECISION MAKING: Moderate - several treatment options, min-mod task modification necessary  REHAB POTENTIAL: Good  EVALUATION COMPLEXITY: Moderate    PLAN:  OT FREQUENCY: 2x/week  OT DURATION: 12 weeks  PLANNED INTERVENTIONS: self care/ADL training, therapeutic exercise, therapeutic activity, neuromuscular re-education, manual therapy, and functional mobility training  RECOMMENDED OTHER SERVICES: N/A  CONSULTED AND AGREED WITH PLAN OF CARE: Patient  PLAN FOR NEXT SESSION: Initiate treatment   Olegario Messier, MS, OTR/L  08/20/2022,  4:08 PM

## 2022-08-26 ENCOUNTER — Ambulatory Visit: Payer: Medicare HMO | Admitting: Occupational Therapy

## 2022-08-27 ENCOUNTER — Other Ambulatory Visit: Payer: Self-pay

## 2022-08-27 DIAGNOSIS — E782 Mixed hyperlipidemia: Secondary | ICD-10-CM | POA: Diagnosis not present

## 2022-08-27 DIAGNOSIS — I10 Essential (primary) hypertension: Secondary | ICD-10-CM | POA: Diagnosis not present

## 2022-08-27 DIAGNOSIS — I2510 Atherosclerotic heart disease of native coronary artery without angina pectoris: Secondary | ICD-10-CM | POA: Diagnosis not present

## 2022-08-27 DIAGNOSIS — I6340 Cerebral infarction due to embolism of unspecified cerebral artery: Secondary | ICD-10-CM | POA: Diagnosis not present

## 2022-08-27 DIAGNOSIS — E118 Type 2 diabetes mellitus with unspecified complications: Secondary | ICD-10-CM | POA: Diagnosis not present

## 2022-08-27 DIAGNOSIS — I480 Paroxysmal atrial fibrillation: Secondary | ICD-10-CM | POA: Diagnosis not present

## 2022-08-27 MED ORDER — METOPROLOL SUCCINATE ER 100 MG PO TB24
200.0000 mg | ORAL_TABLET | Freq: Every day | ORAL | 1 refills | Status: DC
  Filled 2022-08-27: qty 180, 90d supply, fill #0
  Filled 2022-12-12: qty 180, 90d supply, fill #1

## 2022-09-01 ENCOUNTER — Ambulatory Visit: Payer: Medicare HMO | Attending: Neurology | Admitting: Occupational Therapy

## 2022-09-01 DIAGNOSIS — M6281 Muscle weakness (generalized): Secondary | ICD-10-CM | POA: Insufficient documentation

## 2022-09-01 DIAGNOSIS — R278 Other lack of coordination: Secondary | ICD-10-CM | POA: Diagnosis not present

## 2022-09-01 NOTE — Therapy (Addendum)
OUTPATIENT OCCUPATIONAL THERAPY NEURO TREATMENT NOTE Patient Name: Blake [name] MRN: [id number] DOB:[birth year], 77 y.o., male Today's Date: 09/01/2022  PCP: Dr. Judithann Sheen, MD REFERRING PROVIDER: Dr. Cristopher Peru, MD  END OF SESSION:  OT End of Session - 09/01/22 1650     Visit Number 2    Number of Visits 24    Date for OT Re-Evaluation 11/12/22    OT Start Time 1520    OT Stop Time 1600    OT Time Calculation (min) 40 min    Activity Tolerance Patient tolerated treatment well    Behavior During Therapy [facility] for tasks assessed/performed             Past Medical History:  Diagnosis Date   Arthritis    Back pain    with leg pain   Basal cell carcinoma 09/04/2009   R cheek 5.5 cm ant to earlobe - 12/26/2009 excision   Basal cell carcinoma 09/04/2009   R ant nasal alar rim   Basal cell carcinoma 07/31/2014   R mid brow   Basal cell carcinoma 07/30/2016   R ant nasal alar rim at ant edge of BCC scar   Basal cell carcinoma 11/02/2019   L zygoma    Basal cell carcinoma 07/22/2021   left sideburn area, EDC   Basal cell carcinoma 01/02/2022   Right posterior shoulder, EDC   Basal cell carcinoma 01/02/2022   R posterior shoulder, EDC   Basal cell carcinoma 01/02/2022   Right Supraclavicular Base of Neck, EDC   Cancer of base of tongue (HCC) 2006   s/p chemoradiation   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, bilateral    Dyspnea    Dysrhythmia    Esophageal cancer (HCC) 2021   GERD (gastroesophageal reflux disease)    Hyperlipidemia    Hypertension    Numbness and tingling of both lower extremities    with positioning   Paroxysmal atrial fibrillation (HCC)    Port-A-Cath in place 2006   Prostate enlargement    Squamous cell carcinoma of skin 12/14/2019   L pretibial - ED&C    T2DM (type 2 diabetes mellitus) (HCC)    Past Surgical History:  Procedure Laterality Date   ABDOMINAL SURGERY     CAROTID PTA/STENT INTERVENTION Right 07/04/2021   Procedure:  CAROTID PTA/STENT INTERVENTION;  Surgeon: Annice Needy, MD;  Location: [facility];  Service: Cardiovascular;  Laterality: Right;   COLONOSCOPY  12/08/2003   COLONOSCOPY WITH PROPOFOL N/A 03/21/2019   Procedure: COLONOSCOPY WITH PROPOFOL;  Surgeon: Toledo, Boykin Nearing, MD;  Location: [facility];  Service: Gastroenterology;  Laterality: N/A;   ESOPHAGOGASTRODUODENOSCOPY (EGD) WITH PROPOFOL N/A 03/21/2019   Procedure: ESOPHAGOGASTRODUODENOSCOPY (EGD) WITH PROPOFOL;  Surgeon: Toledo, Boykin Nearing, MD;  Location: [facility];  Service: Gastroenterology;  Laterality: N/A;   esophogeal cancer     JOINT REPLACEMENT Right    total hip   LUMBAR LAMINECTOMY/DECOMPRESSION MICRODISCECTOMY N/A 03/03/2018   Procedure: LUMBAR LAMINECTOMY/DECOMPRESSION MICRODISCECTOMY 1 LEVEL-L3-4,L4-5;  Surgeon: Venetia Night, MD;  Location: [facility];  Service: Neurosurgery;  Laterality: N/A;   LUMBAR LAMINECTOMY/DECOMPRESSION MICRODISCECTOMY N/A 02/24/2020   Procedure: LEFT L3-4 MICRODISCECTOMY, L4-5 DECOMPRESSION;  Surgeon: Venetia Night, MD;  Location: [facility];  Service: Neurosurgery;  Laterality: N/A;   MAXIMUM ACCESS (MAS) TRANSFORAMINAL LUMBAR INTERBODY FUSION (TLIF) 1 LEVEL N/A 07/13/2020   Procedure: OPEN L3-4 TRANSFORAMINAL LUMBAR INTERBODY FUSION (TLIF);  Surgeon: Venetia Night, MD;  Location: [facility];  Service: Neurosurgery;  Laterality: N/A;   PARTIAL HIP  ARTHROPLASTY Right 2014   PORT-A-CATH REMOVAL     PORTACATH PLACEMENT Left 01/12/2020   Procedure: INSERTION PORT-A-CATH, with ultrasound fluoroscopy;  Surgeon: Hulda Marin, MD;  Location: [facility];  Service: General;  Laterality: Left;   TONGUE BIOPSY  2006   TONSILLECTOMY     TRIGGER FINGER RELEASE Right 2013   UPPER GI ENDOSCOPY  01/25/14   multiple gastric polyps   Patient Active Problem List   Diagnosis Date Noted   Hypocalcemia 06/02/2022   IDA (iron deficiency anemia) 05/29/2022   Loss of consciousness (HCC) 04/25/2022    Paresthesia 04/25/2022   Encephalomalacia on imaging study 04/25/2022   Positive D dimer 04/25/2022   Vasovagal syncope 04/25/2022   Acidosis 04/25/2022   Sinus bradycardia 04/25/2022   Anemia 04/25/2022   Paraseptal emphysema (HCC) 02/24/2022   Lung nodules 01/01/2022   Carotid stenosis, right 07/04/2021   Paroxysmal A-fib (HCC) 02/26/2021   Spondylolisthesis 07/13/2020   Chest pain 06/26/2020   Hypertensive urgency 06/26/2020   Shortness of breath 06/26/2020   Pneumonia 01/17/2020   Non-intractable vomiting 12/26/2019   Esophageal carcinoma (HCC) 12/09/2019   History of head and neck cancer 12/09/2019   Chronic pain syndrome 12/08/2019   History of lumbar laminectomy 12/08/2019   Supraventricular tachycardia 12/08/2019   Acute postoperative pain 09/13/2019   Malignant neoplasm of lower third of esophagus (HCC) 07/14/2019   Goals of care, counseling/discussion 05/04/2019   Osteoarthritis 04/26/2019   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   Bilateral hand numbness 12/16/2018   Bilateral hand pain 12/16/2018   Lumbar radiculopathy 06/02/2017   Lumbar degenerative disc disease 06/02/2017   Spinal stenosis, lumbar region, 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   Primary osteoarthritis of right shoulder 07/01/2016   Bilateral carotid artery stenosis 06/05/2016   Carotid stenosis 04/22/2016   Allergic rhinitis 08/24/2015   Diabetes mellitus (HCC) 08/24/2015   Personal history of malignant neoplasm of tongue 08/24/2015   HLD (hyperlipidemia) 08/24/2015   Gastric polyposis 08/24/2015   Stenosing tenosynovitis of finger 07/11/2014   Coronary artery disease 06/26/2014   Family history of cancer of digestive system 10/20/2006   Deficiency, 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 Reports that when he was out to dinner with a friend and his arm fell off the table and he lost movement. Pt . Reported he has a follow up with his neurosurgeon soon. Pt accompanied by: self  PERTINENT HISTORY:   Pt. 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 Pt.'s PMHx includes: Right carotid artery stenosis with stenting with further stenosis around the current stents, Esophageal CA, Type2 DM, Chronic back pain with  L3-L4, L5 Lumbar Decompression. Pt. Has a heart monitor placed.  PRECAUTIONS: None  WEIGHT BEARING RESRICTIONS: No  PAIN:  Are you having pain?  4/10 pain in L upper arm region  FALLS: Has patient fallen in last 6 months? No  LIVING ENVIRONMENT: Lives with: lives alone Lives in: House/apartment Stairs: level entry, 2 story Has following equipment at home: Single point cane, Environmental consultant - 2 wheeled, Environmental consultant - 4 wheeled, shower chair, Shower bench, and Grab bars  PLOF: Independent  PATIENT GOALS: To get his left  hand back  OBJECTIVE:   HAND DOMINANCE: Right  ADLs:  Transfers/ambulation related to ADLs: Eating: Independent, modified technique for cutting food Grooming: Independent UB Dressing:  Independent LB Dressing: Increased time to tie tennis shoes Toileting: Independent Bathing: Independent Tub Shower transfers:  Independent with walkin shower   IADLs: Shopping: Independent Light housekeeping:  Pt. Has a personal housekeeper Meal Prep: Minimal cooking(At baseline), able to do light meal prep. Medication management: Independent Financial management:  No change Handwriting:  Uses his dominant right hand  MOBILITY STATUS: Independent   Activity tolerance: Intact  FUNCTIONAL OUTCOME MEASURES: FOTO: 71 TR score:  70  UPPER EXTREMITY ROM:    Active ROM Right Eval WFL Left Eval [facility]  Shoulder flexion    Shoulder abduction    Shoulder adduction    Shoulder extension    Shoulder internal rotation    Shoulder external rotation    Elbow flexion    Elbow extension    Wrist flexion    Wrist extension    Wrist ulnar deviation    Wrist radial deviation    Wrist pronation    Wrist supination    (Blank rows = not tested)  UPPER EXTREMITY MMT:     MMT Right eval Left eval  Shoulder flexion 4+ 4+  Shoulder abduction 4+ 4+  Shoulder adduction    Shoulder extension    Shoulder internal rotation    Shoulder external rotation    Middle trapezius    Lower trapezius    Elbow flexion 5 4+  Elbow extension 5 4+  Wrist flexion 5 4  Wrist extension 5 4  Wrist ulnar deviation    Wrist radial deviation    Wrist pronation 5 4  Wrist supination 5 4  (Blank rows = not tested)  HAND FUNCTION: Grip strength: Right: 59 lbs; Left: 46 lbs, Lateral pinch: Right: 19 lbs, Left: 20 lbs, and 3 point pinch: Right: 15 lbs, Left: 14 lbs  COORDINATION: 9 Hole Peg test: Right: 31 sec; Left: 45 sec  SENSATION: Positive numbness, and tingling Light touch: WFL Proprioception: WFL   EDEMA: N/A  MUSCLE TONE: Intact  COGNITION: Overall cognitive status: Within functional limits for tasks assessed  VISION: Subjective report:  Reports  increased  blurriness with distance, most notably  with driving  Pt. Reports having an eye exam scheduled VISION ASSESSMENT: To be further assessed in functional context  PERCEPTION: WFL  PRAXIS: Impaired: Motor planning   TODAY'S TREATMENT:                                                                                                                              DATE: 09/01/2022  Neuromuscular Reeducation:  Pt. Worked on picking up marbles one at a time and storing them in palm of his L hand. Pt. Moved marbles one at a time from palm to tip of 2nd through 5th digits  x 2 trials. Pt. Worked on using tip pinch to pick  up 1 inch circular pegs and placing them into a peg board placed at an incline. Pt. Removed each of the pegs using his thumb and alternating from 2nd to 5th digit.   Therapeutic Exercise:  Pt. performed left gross gripping with gross grip strengthener. Pt. Worked on sustaining grip while grasping pegs and reaching at various heights. The gripper was set to 23.4# of grip strength resistance x 1 trial. Pt. Performed L hand three point pinch using yellow, red, green, blue, and black resistive clips and reaching at different heights to place them onto dowel. Pt. Performed three point pinch to remove resistive clips for dowel and place them back into container.   PATIENT EDUCATION: Education details:  OT services, POC, goals Person educated: Patient Education method: Explanation, Demonstration, and Verbal cues Education comprehension: verbalized understanding, returned demonstration, and needs further education  HOME EXERCISE PROGRAM:  To continue to assess, and provide as needed.   GOALS: Goals reviewed with patient? Yes  SHORT TERM GOALS: Target date: 10/01/2022               1. Pt. will increase FOTO score by 2 points to reflect Pt. perceived improvement with assessment specific ADL/IADL's.   Baseline: Eval: FOTO score: 71 with TR score: 70 Goal status: INITIAL  2.  Pt. Will be independent with HEPs of the LUE. Baseline: Eval: No current HEP Goal status: INITIAL  LONG TERM GOALS: Target date: 11/12/2022    Pt. Will increase LUE strength by 2 mm grades to assist with ADLs, and IADLs Baseline: Eval: Left shoulder flexion: 4+, abduction: 4+, elbow flexion: 4+, extension:4+, forearm supination:4 , wrist extension: 4 Goal status: INITIAL  2.  Pt. Will increase left grip strength by 5# to be able to securely hold objects, and open containers, or jars. Baseline: Eval: Right: 59 Left: 46 Goal status: INITIAL  3.  Pt. Will improve left  pinch strength to be able to be able to push his belt through his pant loop independently Baseline: Eval: Pinch strength: lateral: right: 19# left: 20#, 3pt.: Right: 15#, Left:14# Goal status: INITIAL  4.  Pt. Will improve left hand [facility] skills by 3 sec. Of speed to be able to button his shirt efficiently Baseline: Eval:  9 hole peg test: Right:  31 sec., Left: 45 sec. Goal status: INITIAL  5.  Pt. Will Pt. Will consistently, and efficiently reach for targets with the left UE with 100% accuracy. Baseline: Eval: Pt. presents with decreased motor control affecting the accuracy of reaching for objects with the left hand when challenged with speed. Goal status: INITIAL     ASSESSMENT:  CLINICAL IMPRESSION  Pt. Reported having had an episode where he lost movement in his L arm and it fell off the table while he was eating at a restaurant over the past week. The pt. reported that his episode only lasted a few minutes. Tasks were modified and repositioned in front of Pt. at the tabletop secondary to intermittent pain in the L upper arm when reaching. Pt. Required rest breaks and alternating the tasks with proprioceptive input at the table top while resting his hand in a flat position to normalize tone. Attempted to administer stereognosis assessment however pt was not able to identify the largest object. Pt. Was able to manipulate the 1 inch pegs however had difficulty when manipulating flat 1/2 inch marbles. Will benefit from skilled OT services to work on improving left hand function in order to increase engagement in, and maximize independence  with ADLs, and IADL tasks.   PERFORMANCE DEFICITS: in functional skills including ADLs, IADLs, coordination, dexterity, proprioception, ROM, strength, and pain, cognitive skills including , and psychosocial skills including coping strategies and environmental adaptation.   IMPAIRMENTS: are limiting patient from ADLs, IADLs, and leisure.   CO-MORBIDITIES: may  have co-morbidities  that affects occupational performance. Patient will benefit from skilled OT to address above impairments and improve overall function.  MODIFICATION OR ASSISTANCE TO COMPLETE EVALUATION: Min-Moderate modification of tasks or assist with assess necessary to complete an evaluation.  OT OCCUPATIONAL PROFILE AND HISTORY: Detailed assessment: Review of records and additional review of physical, cognitive, psychosocial history related to current functional performance.  CLINICAL DECISION MAKING: Moderate - several treatment options, min-mod task modification necessary  REHAB POTENTIAL: Good  EVALUATION COMPLEXITY: Moderate    PLAN:  OT FREQUENCY: 2x/week  OT DURATION: 12 weeks  PLANNED INTERVENTIONS: self care/ADL training, therapeutic exercise, therapeutic activity, neuromuscular re-education, manual therapy, and functional mobility training  RECOMMENDED OTHER SERVICES: N/A  CONSULTED AND AGREED WITH PLAN OF CARE: Patient  PLAN FOR NEXT SESSION: Initiate treatment  Herma Carson, OTS 09/01/2022 5:45 PM  This entire session was performed under the direct supervision and direction of a licensed therapist. I have personally read, edited, and approve of the note as written.   Olegario Messier, MS, OTR/L  09/01/2022, 5:48 PM

## 2022-09-04 ENCOUNTER — Ambulatory Visit: Payer: Medicare HMO | Admitting: Occupational Therapy

## 2022-09-04 DIAGNOSIS — R278 Other lack of coordination: Secondary | ICD-10-CM | POA: Diagnosis not present

## 2022-09-04 DIAGNOSIS — M6281 Muscle weakness (generalized): Secondary | ICD-10-CM

## 2022-09-04 NOTE — Therapy (Addendum)
OUTPATIENT OCCUPATIONAL THERAPY NEURO TREATMENT NOTE Patient Name: Blake [name] MRN: [id number] DOB:1945-10-18, 77 y.o., male Today's Date: 09/04/2022  PCP: Dr. Judithann Sheen, MD REFERRING PROVIDER: Dr. Cristopher Peru, MD  END OF SESSION:  OT End of Session - 09/04/22 1717     Visit Number 3    Number of Visits 24    Date for OT Re-Evaluation 11/12/22    OT Start Time 1600    OT Stop Time 1645    OT Time Calculation (min) 45 min    Activity Tolerance Patient tolerated treatment well    Behavior During Therapy [facility] for tasks assessed/performed             Past Medical History:  Diagnosis Date   Arthritis    Back pain    with leg pain   Basal cell carcinoma 09/04/2009   R cheek 5.5 cm ant to earlobe - 12/26/2009 excision   Basal cell carcinoma 09/04/2009   R ant nasal alar rim   Basal cell carcinoma 07/31/2014   R mid brow   Basal cell carcinoma 07/30/2016   R ant nasal alar rim at ant edge of BCC scar   Basal cell carcinoma 11/02/2019   L zygoma    Basal cell carcinoma 07/22/2021   left sideburn area, EDC   Basal cell carcinoma 01/02/2022   Right posterior shoulder, EDC   Basal cell carcinoma 01/02/2022   R posterior shoulder, EDC   Basal cell carcinoma 01/02/2022   Right Supraclavicular Base of Neck, EDC   Cancer of base of tongue (HCC) 2006   s/p chemoradiation   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, bilateral    Dyspnea    Dysrhythmia    Esophageal cancer (HCC) 2021   GERD (gastroesophageal reflux disease)    Hyperlipidemia    Hypertension    Numbness and tingling of both lower extremities    with positioning   Paroxysmal atrial fibrillation (HCC)    Port-A-Cath in place 2006   Prostate enlargement    Squamous cell carcinoma of skin 12/14/2019   L pretibial - ED&C    T2DM (type 2 diabetes mellitus) (HCC)    Past Surgical History:  Procedure Laterality Date   ABDOMINAL SURGERY     CAROTID PTA/STENT INTERVENTION Right 07/04/2021   Procedure:  CAROTID PTA/STENT INTERVENTION;  Surgeon: Annice Needy, MD;  Location: [facility];  Service: Cardiovascular;  Laterality: Right;   COLONOSCOPY  12/08/2003   COLONOSCOPY WITH PROPOFOL N/A 03/21/2019   Procedure: COLONOSCOPY WITH PROPOFOL;  Surgeon: Toledo, Boykin Nearing, MD;  Location: [facility];  Service: Gastroenterology;  Laterality: N/A;   ESOPHAGOGASTRODUODENOSCOPY (EGD) WITH PROPOFOL N/A 03/21/2019   Procedure: ESOPHAGOGASTRODUODENOSCOPY (EGD) WITH PROPOFOL;  Surgeon: Toledo, Boykin Nearing, MD;  Location: [facility];  Service: Gastroenterology;  Laterality: N/A;   esophogeal cancer     JOINT REPLACEMENT Right    total hip   LUMBAR LAMINECTOMY/DECOMPRESSION MICRODISCECTOMY N/A 03/03/2018   Procedure: LUMBAR LAMINECTOMY/DECOMPRESSION MICRODISCECTOMY 1 LEVEL-L3-4,L4-5;  Surgeon: Venetia Night, MD;  Location: [facility];  Service: Neurosurgery;  Laterality: N/A;   LUMBAR LAMINECTOMY/DECOMPRESSION MICRODISCECTOMY N/A 02/24/2020   Procedure: LEFT L3-4 MICRODISCECTOMY, L4-5 DECOMPRESSION;  Surgeon: Venetia Night, MD;  Location: [facility];  Service: Neurosurgery;  Laterality: N/A;   MAXIMUM ACCESS (MAS) TRANSFORAMINAL LUMBAR INTERBODY FUSION (TLIF) 1 LEVEL N/A 07/13/2020   Procedure: OPEN L3-4 TRANSFORAMINAL LUMBAR INTERBODY FUSION (TLIF);  Surgeon: Venetia Night, MD;  Location: [facility];  Service: Neurosurgery;  Laterality: N/A;   PARTIAL HIP  ARTHROPLASTY Right 2014   PORT-A-CATH REMOVAL     PORTACATH PLACEMENT Left 01/12/2020   Procedure: INSERTION PORT-A-CATH, with ultrasound fluoroscopy;  Surgeon: Hulda Marin, MD;  Location: [facility];  Service: General;  Laterality: Left;   TONGUE BIOPSY  2006   TONSILLECTOMY     TRIGGER FINGER RELEASE Right 2013   UPPER GI ENDOSCOPY  01/25/14   multiple gastric polyps   Patient Active Problem List   Diagnosis Date Noted   Hypocalcemia 06/02/2022   IDA (iron deficiency anemia) 05/29/2022   Loss of consciousness (HCC) 04/25/2022    Paresthesia 04/25/2022   Encephalomalacia on imaging study 04/25/2022   Positive D dimer 04/25/2022   Vasovagal syncope 04/25/2022   Acidosis 04/25/2022   Sinus bradycardia 04/25/2022   Anemia 04/25/2022   Paraseptal emphysema (HCC) 02/24/2022   Lung nodules 01/01/2022   Carotid stenosis, right 07/04/2021   Paroxysmal A-fib (HCC) 02/26/2021   Spondylolisthesis 07/13/2020   Chest pain 06/26/2020   Hypertensive urgency 06/26/2020   Shortness of breath 06/26/2020   Pneumonia 01/17/2020   Non-intractable vomiting 12/26/2019   Esophageal carcinoma (HCC) 12/09/2019   History of head and neck cancer 12/09/2019   Chronic pain syndrome 12/08/2019   History of lumbar laminectomy 12/08/2019   Supraventricular tachycardia 12/08/2019   Acute postoperative pain 09/13/2019   Malignant neoplasm of lower third of esophagus (HCC) 07/14/2019   Goals of care, counseling/discussion 05/04/2019   Osteoarthritis 04/26/2019   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   Bilateral hand numbness 12/16/2018   Bilateral hand pain 12/16/2018   Lumbar radiculopathy 06/02/2017   Lumbar degenerative disc disease 06/02/2017   Spinal stenosis, lumbar region, 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   Primary osteoarthritis of right shoulder 07/01/2016   Bilateral carotid artery stenosis 06/05/2016   Carotid stenosis 04/22/2016   Allergic rhinitis 08/24/2015   Diabetes mellitus (HCC) 08/24/2015   Personal history of malignant neoplasm of tongue 08/24/2015   HLD (hyperlipidemia) 08/24/2015   Gastric polyposis 08/24/2015   Stenosing tenosynovitis of finger 07/11/2014   Coronary artery disease 06/26/2014   Family history of cancer of digestive system 10/20/2006   Deficiency, 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  reports having to make a change to the his schedule on Monday. Pt accompanied by: self  PERTINENT HISTORY:   Pt. 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 Pt.'s PMHx includes: Right carotid artery stenosis with stenting with further stenosis around the current stents, Esophageal CA, Type2 DM, Chronic back pain with  L3-L4, L5 Lumbar Decompression. Pt. Has a heart monitor placed.  PRECAUTIONS: None  WEIGHT BEARING RESRICTIONS: No  PAIN:  Are you having pain?  4/10 pain in L upper arm region  FALLS: Has patient fallen in last 6 months? No  LIVING ENVIRONMENT: Lives with: lives alone Lives in: House/apartment Stairs: level entry, 2 story Has following equipment at home: Single point cane, Environmental consultant - 2 wheeled, Environmental consultant - 4 wheeled, shower chair, Shower bench, and Grab bars  PLOF: Independent  PATIENT GOALS: To get his left hand back  OBJECTIVE:   HAND DOMINANCE: Right  ADLs:  Transfers/ambulation related to ADLs: Eating: Independent, modified technique for cutting food Grooming: Independent UB  Dressing:  Independent LB Dressing: Increased time to tie tennis shoes Toileting: Independent Bathing: Independent Tub Shower transfers:  Independent with walkin shower   IADLs: Shopping: Independent Light housekeeping:  Pt. Has a personal housekeeper Meal Prep: Minimal cooking(At baseline), able to do light meal prep. Medication management: Independent Financial management:  No change Handwriting:  Uses his dominant right hand  MOBILITY STATUS: Independent   Activity tolerance: Intact  FUNCTIONAL OUTCOME MEASURES: FOTO: 71 TR score: 70  UPPER EXTREMITY ROM:    Active ROM Right Eval WFL Left Eval [facility]  Shoulder flexion    Shoulder abduction    Shoulder  adduction    Shoulder extension    Shoulder internal rotation    Shoulder external rotation    Elbow flexion    Elbow extension    Wrist flexion    Wrist extension    Wrist ulnar deviation    Wrist radial deviation    Wrist pronation    Wrist supination    (Blank rows = not tested)  UPPER EXTREMITY MMT:     MMT Right eval Left eval  Shoulder flexion 4+ 4+  Shoulder abduction 4+ 4+  Shoulder adduction    Shoulder extension    Shoulder internal rotation    Shoulder external rotation    Middle trapezius    Lower trapezius    Elbow flexion 5 4+  Elbow extension 5 4+  Wrist flexion 5 4  Wrist extension 5 4  Wrist ulnar deviation    Wrist radial deviation    Wrist pronation 5 4  Wrist supination 5 4  (Blank rows = not tested)  HAND FUNCTION: Grip strength: Right: 59 lbs; Left: 46 lbs, Lateral pinch: Right: 19 lbs, Left: 20 lbs, and 3 point pinch: Right: 15 lbs, Left: 14 lbs  COORDINATION: 9 Hole Peg test: Right: 31 sec; Left: 45 sec  SENSATION: Positive numbness, and tingling Light touch: WFL Proprioception: WFL   EDEMA: N/A  MUSCLE TONE: Intact  COGNITION: Overall cognitive status: Within functional limits for tasks assessed  VISION: Subjective report:  Reports  increased  blurriness with distance, most notably  with driving  Pt. Reports having an eye exam scheduled VISION ASSESSMENT: To be further assessed in functional context  PERCEPTION: WFL  PRAXIS: Impaired: Motor planning   TODAY'S TREATMENT:                                                                                                                              DATE: 09/01/2022  Neuromuscular Reeducation:  Pt. Worked on using tip pinch to pick up 1 inch circular pegs and placing them into a peg board placed at an incline. Pt. Removed each of the pegs using his thumb and alternating from 2nd to 5th digit.  Therapeutic Exercise:  Pt. performed left gross gripping with a gross grip  strengthener. Pt. Worked on sustaining grip while grasping pegs. The gripper was set to 23.4# of grip strength resistance  x 2 trials. Pt. Performed L hand three point pinch using yellow, red, green, blue, and black resistive clips and  placing them onto dowel in front of him. Pt. Performed three point pinch to remove resistive clips for dowel and place them back into container.   Self Care Home Management: Pt. worked on typing skills completing one and two minute typing tests. Pt. Used both his R and L hand to complete the test.   PATIENT EDUCATION: Education details:  OT services, POC, goals Person educated: Patient Education method: Explanation, Demonstration, and Verbal cues Education comprehension: verbalized understanding, returned demonstration, and needs further education  HOME EXERCISE PROGRAM:  To continue to assess, and provide as needed.   GOALS: Goals reviewed with patient? Yes  SHORT TERM GOALS: Target date: 10/01/2022               1. Pt. will increase FOTO score by 2 points to reflect Pt. perceived improvement with assessment specific ADL/IADL's.   Baseline: Eval: FOTO score: 71 with TR score: 70 Goal status: INITIAL  2.  Pt. Will be independent with HEPs of the LUE. Baseline: Eval: No current HEP Goal status: INITIAL  LONG TERM GOALS: Target date: 11/12/2022    Pt. Will increase LUE strength by 2 mm grades to assist with ADLs, and IADLs Baseline: Eval: Left shoulder flexion: 4+, abduction: 4+, elbow flexion: 4+, extension:4+, forearm supination:4 , wrist extension: 4 Goal status: INITIAL  2.  Pt. Will increase left grip strength by 5# to be able to securely hold objects, and open containers, or jars. Baseline: Eval: Right: 59 Left: 46 Goal status: INITIAL  3.  Pt. Will improve left pinch strength to be able to be able to push his belt through his pant loop independently Baseline: Eval: Pinch strength: lateral: right: 19# left: 20#, 3pt.: Right: 15#,  Left:14# Goal status: INITIAL  4.  Pt. Will improve left hand [facility] skills by 3 sec. Of speed to be able to button his shirt efficiently Baseline: Eval:  9 hole peg test: Right:  31 sec., Left: 45 sec. Goal status: INITIAL  5.  Pt. Will Pt. Will consistently, and efficiently reach for targets with the left UE with 100% accuracy. Baseline: Eval: Pt. presents with decreased motor control affecting the accuracy of reaching for objects with the left hand when challenged with speed. Goal status: INITIAL     ASSESSMENT:  CLINICAL IMPRESSION  Pt. reports having discomfort in L upper arm when performing reaching tasks. Today, tasks were modified, and  positioned in front of Pt. at the tabletop. Pt. Was able to manipulate the 1 inch circular pegs and place them onto pegboard, however required increased time. Pt. Completed the 1 minute typing task with 50% accuracy and 3 WPM net. Pt. Completed the 2 minute typing  task with 48% accuracy and 3 WPM net. Pt. Presented with difficulty contently pressing the correct keys with the L hand. Pt.continues to benefit from skilled OT services to work on improving left hand function in order to increase engagement in, and maximize independence with ADLs, and IADL tasks.   PERFORMANCE DEFICITS: in functional skills including ADLs, IADLs, coordination, dexterity, proprioception, ROM, strength, and pain, cognitive skills including , and psychosocial skills including coping strategies and environmental adaptation.   IMPAIRMENTS: are limiting patient from ADLs, IADLs, and leisure.   CO-MORBIDITIES: may have co-morbidities  that affects occupational performance. Patient will benefit from skilled OT to address above impairments and improve overall function.  MODIFICATION OR ASSISTANCE  TO COMPLETE EVALUATION: Min-Moderate modification of tasks or assist with assess necessary to complete an evaluation.  OT OCCUPATIONAL PROFILE AND HISTORY: Detailed assessment: Review of  records and additional review of physical, cognitive, psychosocial history related to current functional performance.  CLINICAL DECISION MAKING: Moderate - several treatment options, min-mod task modification necessary  REHAB POTENTIAL: Good  EVALUATION COMPLEXITY: Moderate    PLAN:  OT FREQUENCY: 2x/week  OT DURATION: 12 weeks  PLANNED INTERVENTIONS: self care/ADL training, therapeutic exercise, therapeutic activity, neuromuscular re-education, manual therapy, and functional mobility training  RECOMMENDED OTHER SERVICES: N/A  CONSULTED AND AGREED WITH PLAN OF CARE: Patient  PLAN FOR NEXT SESSION: Initiate treatment  Herma Carson, OTS 09/04/2022 5:19 PM  This entire session was performed under the direct supervision and direction of a licensed therapist. I have personally read, edited, and approve of the note as written.   Olegario Messier, MS, OTR/L  09/04/2022, 5:19 PM

## 2022-09-08 ENCOUNTER — Ambulatory Visit: Payer: Medicare HMO

## 2022-09-08 DIAGNOSIS — M6281 Muscle weakness (generalized): Secondary | ICD-10-CM | POA: Diagnosis not present

## 2022-09-08 DIAGNOSIS — R278 Other lack of coordination: Secondary | ICD-10-CM | POA: Diagnosis not present

## 2022-09-08 NOTE — Therapy (Addendum)
OUTPATIENT OCCUPATIONAL THERAPY NEURO TREATMENT NOTE Patient Name: Blake [name] MRN: [id number] DOB:03-03-1946, 77 y.o., male Today's Date: 09/08/2022  PCP: Dr. Judithann Sheen, MD REFERRING PROVIDER: Dr. Cristopher Peru, MD  END OF SESSION:  OT End of Session - 09/08/22 1510     Visit Number 4    Number of Visits 24    Date for OT Re-Evaluation 11/12/22    OT Start Time 1430    OT Stop Time 1510    OT Time Calculation (min) 40 min    Activity Tolerance Patient tolerated treatment well    Behavior During Therapy [facility] for tasks assessed/performed             Past Medical History:  Diagnosis Date   Arthritis    Back pain    with leg pain   Basal cell carcinoma 09/04/2009   R cheek 5.5 cm ant to earlobe - 12/26/2009 excision   Basal cell carcinoma 09/04/2009   R ant nasal alar rim   Basal cell carcinoma 07/31/2014   R mid brow   Basal cell carcinoma 07/30/2016   R ant nasal alar rim at ant edge of BCC scar   Basal cell carcinoma 11/02/2019   L zygoma    Basal cell carcinoma 07/22/2021   left sideburn area, EDC   Basal cell carcinoma 01/02/2022   Right posterior shoulder, EDC   Basal cell carcinoma 01/02/2022   R posterior shoulder, EDC   Basal cell carcinoma 01/02/2022   Right Supraclavicular Base of Neck, EDC   Cancer of base of tongue (HCC) 2006   s/p chemoradiation   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, bilateral    Dyspnea    Dysrhythmia    Esophageal cancer (HCC) 2021   GERD (gastroesophageal reflux disease)    Hyperlipidemia    Hypertension    Numbness and tingling of both lower extremities    with positioning   Paroxysmal atrial fibrillation (HCC)    Port-A-Cath in place 2006   Prostate enlargement    Squamous cell carcinoma of skin 12/14/2019   L pretibial - ED&C    T2DM (type 2 diabetes mellitus) (HCC)    Past Surgical History:  Procedure Laterality Date   ABDOMINAL SURGERY     CAROTID PTA/STENT INTERVENTION Right 07/04/2021   Procedure:  CAROTID PTA/STENT INTERVENTION;  Surgeon: Annice Needy, MD;  Location: [facility];  Service: Cardiovascular;  Laterality: Right;   COLONOSCOPY  12/08/2003   COLONOSCOPY WITH PROPOFOL N/A 03/21/2019   Procedure: COLONOSCOPY WITH PROPOFOL;  Surgeon: Toledo, Boykin Nearing, MD;  Location: [facility] ENDOS[facility];  Service: Gastroenterology;  Laterality: N/A;   ESOPHAGOGASTRODUODENOSCOPY (EGD) WITH PROPOFOL N/A 03/21/2019   Procedure: ESOPHAGOGASTRODUODENOSCOPY (EGD) WITH PROPOFOL;  Surgeon: Toledo, Boykin Nearing, MD;  Location: [facility] ENDOSCOPY;  Service: Gastroenterology;  Laterality: N/A;   esophogeal cancer     JOINT REPLACEMENT Right    total hip   LUMBAR LAMINECTOMY/DECOMPRESSION MICRODISCECTOMY N/A 03/03/2018   Procedure: LUMBAR LAMINECTOMY/DECOMPRESSION MICRODISCECTOMY 1 LEVEL-L3-4,L4-5;  Surgeon: Venetia Night, MD;  Location: [facility] ORS;  Service: Neurosurgery;  Laterality: N/A;   LUMBAR LAMINECTOMY/DECOMPRESSION MICRODISCECTOMY N/A 02/24/2020   Procedure: LEFT L3-4 MICRODISCECTOMY, L4-5 DECOMPRESSION;  Surgeon: Venetia Night, MD;  Location: [facility] ORS;  Service: Neurosurgery;  Laterality: N/A;   MAXIMUM ACCESS (MAS) TRANSFORAMINAL LUMBAR INTERBODY FUSION (TLIF) 1 LEVEL N/A 07/13/2020   Procedure: OPEN L3-4 TRANSFORAMINAL LUMBAR INTERBODY FUSION (TLIF);  Surgeon: Venetia Night, MD;  Location: [facility] OR[facility];  Service: Neurosurgery;  Laterality: N/A;   PARTIAL HIP  ARTHROPLASTY Right 2014   PORT-A-CATH REMOVAL     PORTACATH PLACEMENT Left 01/12/2020   Procedure: INSERTION PORT-A-CATH, with ultrasound fluoroscopy;  Surgeon: Hulda Marin, MD;  Location: [facility] ORS;  Service: General;  Laterality: Left;   TONGUE BIOPSY  2006   TONSILLECTOMY     TRIGGER FINGER RELEASE Right 2013   UPPER GI ENDOSCOPY  01/25/14   multiple gastric polyps   Patient Active Problem List   Diagnosis Date Noted   Hypocalcemia 06/02/2022   IDA (iron deficiency anemia) 05/29/2022   Loss of consciousness (HCC) 04/25/2022    Paresthesia 04/25/2022   Encephalomalacia on imaging study 04/25/2022   Positive D dimer 04/25/2022   Vasovagal syncope 04/25/2022   Acidosis 04/25/2022   Sinus bradycardia 04/25/2022   Anemia 04/25/2022   Paraseptal emphysema (HCC) 02/24/2022   Lung nodules 01/01/2022   Carotid stenosis, right 07/04/2021   Paroxysmal A-fib (HCC) 02/26/2021   Spondylolisthesis 07/13/2020   Chest pain 06/26/2020   Hypertensive urgency 06/26/2020   Shortness of breath 06/26/2020   Pneumonia 01/17/2020   Non-intractable vomiting 12/26/2019   Esophageal carcinoma (HCC) 12/09/2019   History of head and neck cancer 12/09/2019   Chronic pain syndrome 12/08/2019   History of lumbar laminectomy 12/08/2019   Supraventricular tachycardia 12/08/2019   Acute postoperative pain 09/13/2019   Malignant neoplasm of lower third of esophagus (HCC) 07/14/2019   Goals of care, counseling/discussion 05/04/2019   Osteoarthritis 04/26/2019   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   Bilateral hand numbness 12/16/2018   Bilateral hand pain 12/16/2018   Lumbar radiculopathy 06/02/2017   Lumbar degenerative disc disease 06/02/2017   Spinal stenosis, lumbar region, 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   Primary osteoarthritis of right shoulder 07/01/2016   Bilateral carotid artery stenosis 06/05/2016   Carotid stenosis 04/22/2016   Allergic rhinitis 08/24/2015   Diabetes mellitus (HCC) 08/24/2015   Personal history of malignant neoplasm of tongue 08/24/2015   HLD (hyperlipidemia) 08/24/2015   Gastric polyposis 08/24/2015   Stenosing tenosynovitis of finger 07/11/2014   Coronary artery disease 06/26/2014   Family history of cancer of digestive system 10/20/2006   Deficiency, 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  reports having an AFIB episode around 6AM this morning. Pt. Reports going back to sleep after and that he is feeling better now. Pt accompanied by: self  PERTINENT HISTORY:   Pt. 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 Pt.'s PMHx includes: Right carotid artery stenosis with stenting with further stenosis around the current stents, Esophageal CA, Type2 DM, Chronic back pain with  L3-L4, L5 Lumbar Decompression. Pt. Has a heart monitor placed.  PRECAUTIONS: None  WEIGHT BEARING RESRICTIONS: No  PAIN:  Are you having pain? No pain  FALLS: Has patient fallen in last 6 months? No  LIVING ENVIRONMENT: Lives with: lives alone Lives in: House/apartment Stairs: level entry, 2 story Has following equipment at home: Single point cane, Environmental consultant - 2 wheeled, Environmental consultant - 4 wheeled, shower chair, Shower bench, and Grab bars  PLOF: Independent  PATIENT GOALS: To get his left hand back  OBJECTIVE:   HAND DOMINANCE: Right  ADLs:  Transfers/ambulation related to ADLs: Eating: Independent, modified technique for  cutting food Grooming: Independent UB Dressing:  Independent LB Dressing: Increased time to tie tennis shoes Toileting: Independent Bathing: Independent Tub Shower transfers:  Independent with walkin shower   IADLs: Shopping: Independent Light housekeeping:  Pt. Has a personal housekeeper Meal Prep: Minimal cooking(At baseline), able to do light meal prep. Medication management: Independent Financial management:  No change Handwriting:  Uses his dominant right hand  MOBILITY STATUS: Independent   Activity tolerance: Intact  FUNCTIONAL OUTCOME MEASURES: FOTO: 71 TR score: 70  UPPER EXTREMITY ROM:    Active ROM Right Eval WFL Left Eval [facility]  Shoulder flexion     Shoulder abduction    Shoulder adduction    Shoulder extension    Shoulder internal rotation    Shoulder external rotation    Elbow flexion    Elbow extension    Wrist flexion    Wrist extension    Wrist ulnar deviation    Wrist radial deviation    Wrist pronation    Wrist supination    (Blank rows = not tested)  UPPER EXTREMITY MMT:     MMT Right eval Left eval  Shoulder flexion 4+ 4+  Shoulder abduction 4+ 4+  Shoulder adduction    Shoulder extension    Shoulder internal rotation    Shoulder external rotation    Middle trapezius    Lower trapezius    Elbow flexion 5 4+  Elbow extension 5 4+  Wrist flexion 5 4  Wrist extension 5 4  Wrist ulnar deviation    Wrist radial deviation    Wrist pronation 5 4  Wrist supination 5 4  (Blank rows = not tested)  HAND FUNCTION: Grip strength: Right: 59 lbs; Left: 46 lbs, Lateral pinch: Right: 19 lbs, Left: 20 lbs, and 3 point pinch: Right: 15 lbs, Left: 14 lbs  COORDINATION: 9 Hole Peg test: Right: 31 sec; Left: 45 sec  SENSATION: Positive numbness, and tingling Light touch: WFL Proprioception: WFL   EDEMA: N/A  MUSCLE TONE: Intact  COGNITION: Overall cognitive status: Within functional limits for tasks assessed  VISION: Subjective report:  Reports  increased  blurriness with distance, most notably  with driving  Pt. Reports having an eye exam scheduled VISION ASSESSMENT: To be further assessed in functional context  PERCEPTION: WFL  PRAXIS: Impaired: Motor planning   TODAY'S TREATMENT:                                                                                                                              DATE: 09/08/2022 Neuromuscular Reeducation:  Pt. Worked on using L tip pinch to pick up 1 inch circular pegs and placing them into a peg board placed at an incline using his thumb and alternating between the 2nd-5th digit.  Therapeutic Exercise:  Pt. performed left gross gripping with a gross grip  strengthener. Pt. Worked on sustaining grip while grasping pegs. The gripper was set to 23.4# of grip strength resistance for the  1st trial and adjusted to 28.9# for 2nd trial. Pt. Performed 2# dumbbell exercises including elbow extension/flexion, wrist flexion/extension, and forearm pronation/supination. Pt. Performed 10 reps of each exercise and completed 2 sets. Pt. Was provided an educational handout with the LUE dumbbell exercises and encouraged to complete them at home.  PATIENT EDUCATION: Education details:  HEP progression (dumbbells for L elbow, forearm, wrist strengthening) Person educated: Patient Education method: Explanation, Demonstration, and Verbal cues, written handout Education comprehension: verbalized understanding, returned demonstration, and needs further education  HOME EXERCISE PROGRAM: Strengthening for L elbow, forearm, wrist using dumbbell  GOALS: Goals reviewed with patient? Yes  SHORT TERM GOALS: Target date: 10/01/2022               1. Pt. will increase FOTO score by 2 points to reflect Pt. perceived improvement with assessment specific ADL/IADL's.   Baseline: Eval: FOTO score: 71 with TR score: 70 Goal status: INITIAL  2.  Pt. Will be independent with HEPs of the LUE. Baseline: Eval: No current HEP Goal status: INITIAL  LONG TERM GOALS: Target date: 11/12/2022    Pt. Will increase LUE strength by 2 mm grades to assist with ADLs, and IADLs Baseline: Eval: Left shoulder flexion: 4+, abduction: 4+, elbow flexion: 4+, extension:4+, forearm supination:4 , wrist extension: 4 Goal status: INITIAL  2.  Pt. Will increase left grip strength by 5# to be able to securely hold objects, and open containers, or jars. Baseline: Eval: Right: 59 Left: 46 Goal status: INITIAL  3.  Pt. Will improve left pinch strength to be able to be able to push his belt through his pant loop independently Baseline: Eval: Pinch strength: lateral: right: 19# left: 20#, 3pt.: Right: 15#,  Left:14# Goal status: INITIAL  4.  Pt. Will improve left hand [facility] skills by 3 sec. Of speed to be able to button his shirt efficiently Baseline: Eval:  9 hole peg test: Right:  31 sec., Left: 45 sec. Goal status: INITIAL  5.  Pt. Will Pt. Will consistently, and efficiently reach for targets with the left UE with 100% accuracy. Baseline: Eval: Pt. presents with decreased motor control affecting the accuracy of reaching for objects with the left hand when challenged with speed. Goal status: INITIAL     ASSESSMENT:  CLINICAL IMPRESSION  Pt. Was able to perform gross gripping with a gross gripping strengthener and was able to move up to 28.9# resistance. Pt. Was able to complete 2# dumbbell exercises however, required verbal cues and frequent rest breaks throughout to ensure proper form. Pt. Was provided a handout of the dumbbell exercises and demonstrated a proper understanding of how to complete them at home. Pt. Was able to manipulate the 1 inch circular pegs and place them onto pegboard, however required increased time and dropped a total of 3 pegs.  Pt.continues to benefit from skilled OT services to work on improving left hand function in order to increase engagement in, and maximize independence with ADLs, and IADL tasks.   PERFORMANCE DEFICITS: in functional skills including ADLs, IADLs, coordination, dexterity, proprioception, ROM, strength, and pain, cognitive skills including , and psychosocial skills including coping strategies and environmental adaptation.   IMPAIRMENTS: are limiting patient from ADLs, IADLs, and leisure.   CO-MORBIDITIES: may have co-morbidities  that affects occupational performance. Patient will benefit from skilled OT to address above impairments and improve overall function.  MODIFICATION OR ASSISTANCE TO COMPLETE EVALUATION: Min-Moderate modification of tasks or assist with assess necessary to complete an evaluation.  OT  OCCUPATIONAL PROFILE AND HISTORY:  Detailed assessment: Review of records and additional review of physical, cognitive, psychosocial history related to current functional performance.  CLINICAL DECISION MAKING: Moderate - several treatment options, min-mod task modification necessary  REHAB POTENTIAL: Good  EVALUATION COMPLEXITY: Moderate    PLAN:  OT FREQUENCY: 2x/week  OT DURATION: 12 weeks  PLANNED INTERVENTIONS: self care/ADL training, therapeutic exercise, therapeutic activity, neuromuscular re-education, manual therapy, and functional mobility training  RECOMMENDED OTHER SERVICES: N/A  CONSULTED AND AGREED WITH PLAN OF CARE: Patient  PLAN FOR NEXT SESSION: see above  This entire session was performed under direct supervision and direction of a licensed therapist/therapist assistant . I have personally read, edited and approve of the note as written.  Otis Dials, OT 09/08/2022 5:05 PM    Herma Carson, OTS 09/08/2022 4:44 PM

## 2022-09-10 ENCOUNTER — Ambulatory Visit: Payer: Medicare HMO | Admitting: Occupational Therapy

## 2022-09-10 DIAGNOSIS — R278 Other lack of coordination: Secondary | ICD-10-CM | POA: Diagnosis not present

## 2022-09-10 DIAGNOSIS — M6281 Muscle weakness (generalized): Secondary | ICD-10-CM

## 2022-09-10 NOTE — Therapy (Addendum)
OUTPATIENT OCCUPATIONAL THERAPY NEURO TREATMENT NOTE Patient Name: Blake [name] MRN: [id number] DOB:02/01/1946, 77 y.o., male Today's Date: 09/10/2022  PCP: Dr. Judithann Sheen, MD REFERRING PROVIDER: Dr. Cristopher Peru, MD  END OF SESSION:  OT End of Session - 09/10/22 1349     Visit Number 5    Number of Visits 24    Date for OT Re-Evaluation 11/12/22    OT Start Time 1300    OT Stop Time 1345    OT Time Calculation (min) 45 min    Activity Tolerance Patient tolerated treatment well    Behavior During Therapy [facility] for tasks assessed/performed             Past Medical History:  Diagnosis Date   Arthritis    Back pain    with leg pain   Basal cell carcinoma 09/04/2009   R cheek 5.5 cm ant to earlobe - 12/26/2009 excision   Basal cell carcinoma 09/04/2009   R ant nasal alar rim   Basal cell carcinoma 07/31/2014   R mid brow   Basal cell carcinoma 07/30/2016   R ant nasal alar rim at ant edge of BCC scar   Basal cell carcinoma 11/02/2019   L zygoma    Basal cell carcinoma 07/22/2021   left sideburn area, EDC   Basal cell carcinoma 01/02/2022   Right posterior shoulder, EDC   Basal cell carcinoma 01/02/2022   R posterior shoulder, EDC   Basal cell carcinoma 01/02/2022   Right Supraclavicular Base of Neck, EDC   Cancer of base of tongue (HCC) 2006   s/p chemoradiation   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, bilateral    Dyspnea    Dysrhythmia    Esophageal cancer (HCC) 2021   GERD (gastroesophageal reflux disease)    Hyperlipidemia    Hypertension    Numbness and tingling of both lower extremities    with positioning   Paroxysmal atrial fibrillation (HCC)    Port-A-Cath in place 2006   Prostate enlargement    Squamous cell carcinoma of skin 12/14/2019   L pretibial - ED&C    T2DM (type 2 diabetes mellitus) (HCC)    Past Surgical History:  Procedure Laterality Date   ABDOMINAL SURGERY     CAROTID PTA/STENT INTERVENTION Right 07/04/2021   Procedure:  CAROTID PTA/STENT INTERVENTION;  Surgeon: Annice Needy, MD;  Location: [facility];  Service: Cardiovascular;  Laterality: Right;   COLONOSCOPY  12/08/2003   COLONOSCOPY WITH PROPOFOL N/A 03/21/2019   Procedure: COLONOSCOPY WITH PROPOFOL;  Surgeon: Toledo, Boykin Nearing, MD;  Location: [facility] ENDOS[facility];  Service: Gastroenterology;  Laterality: N/A;   ESOPHAGOGASTRODUODENOSCOPY (EGD) WITH PROPOFOL N/A 03/21/2019   Procedure: ESOPHAGOGASTRODUODENOSCOPY (EGD) WITH PROPOFOL;  Surgeon: Toledo, Boykin Nearing, MD;  Location: [facility] ENDOS[facility];  Service: Gastroenterology;  Laterality: N/A;   esophogeal cancer     JOINT REPLACEMENT Right    total hip   LUMBAR LAMINECTOMY/DECOMPRESSION MICRODISCECTOMY N/A 03/03/2018   Procedure: LUMBAR LAMINECTOMY/DECOMPRESSION MICRODISCECTOMY 1 LEVEL-L3-4,L4-5;  Surgeon: Venetia Night, MD;  Location: [facility] OR[facility];  Service: Neurosurgery;  Laterality: N/A;   LUMBAR LAMINECTOMY/DECOMPRESSION MICRODISCECTOMY N/A 02/24/2020   Procedure: LEFT L3-4 MICRODISCECTOMY, L4-5 DECOMPRESSION;  Surgeon: Venetia Night, MD;  Location: [facility] OR[facility];  Service: Neurosurgery;  Laterality: N/A;   MAXIMUM ACCESS (MAS) TRANSFORAMINAL LUMBAR INTERBODY FUSION (TLIF) 1 LEVEL N/A 07/13/2020   Procedure: OPEN L3-4 TRANSFORAMINAL LUMBAR INTERBODY FUSION (TLIF);  Surgeon: Venetia Night, MD;  Location: [facility] OR[facility];  Service: Neurosurgery;  Laterality: N/A;   PARTIAL HIP  ARTHROPLASTY Right 2014   PORT-A-CATH REMOVAL     PORTACATH PLACEMENT Left 01/12/2020   Procedure: INSERTION PORT-A-CATH, with ultrasound fluoroscopy;  Surgeon: Hulda Marin, MD;  Location: [facility] ORS;  Service: General;  Laterality: Left;   TONGUE BIOPSY  2006   TONSILLECTOMY     TRIGGER FINGER RELEASE Right 2013   UPPER GI ENDOSCOPY  01/25/14   multiple gastric polyps   Patient Active Problem List   Diagnosis Date Noted   Hypocalcemia 06/02/2022   IDA (iron deficiency anemia) 05/29/2022   Loss of consciousness (HCC) 04/25/2022    Paresthesia 04/25/2022   Encephalomalacia on imaging study 04/25/2022   Positive D dimer 04/25/2022   Vasovagal syncope 04/25/2022   Acidosis 04/25/2022   Sinus bradycardia 04/25/2022   Anemia 04/25/2022   Paraseptal emphysema (HCC) 02/24/2022   Lung nodules 01/01/2022   Carotid stenosis, right 07/04/2021   Paroxysmal A-fib (HCC) 02/26/2021   Spondylolisthesis 07/13/2020   Chest pain 06/26/2020   Hypertensive urgency 06/26/2020   Shortness of breath 06/26/2020   Pneumonia 01/17/2020   Non-intractable vomiting 12/26/2019   Esophageal carcinoma (HCC) 12/09/2019   History of head and neck cancer 12/09/2019   Chronic pain syndrome 12/08/2019   History of lumbar laminectomy 12/08/2019   Supraventricular tachycardia 12/08/2019   Acute postoperative pain 09/13/2019   Malignant neoplasm of lower third of esophagus (HCC) 07/14/2019   Goals of care, counseling/discussion 05/04/2019   Osteoarthritis 04/26/2019   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   Bilateral hand numbness 12/16/2018   Bilateral hand pain 12/16/2018   Lumbar radiculopathy 06/02/2017   Lumbar degenerative disc disease 06/02/2017   Spinal stenosis, lumbar region, 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   Primary osteoarthritis of right shoulder 07/01/2016   Bilateral carotid artery stenosis 06/05/2016   Carotid stenosis 04/22/2016   Allergic rhinitis 08/24/2015   Diabetes mellitus (HCC) 08/24/2015   Personal history of malignant neoplasm of tongue 08/24/2015   HLD (hyperlipidemia) 08/24/2015   Gastric polyposis 08/24/2015   Stenosing tenosynovitis of finger 07/11/2014   Coronary artery disease 06/26/2014   Family history of cancer of digestive system 10/20/2006   Deficiency, 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  reports he had a doctors appointment on Monday afternoon to determine if he has sleep apnea.  Pt accompanied by: self  PERTINENT HISTORY:   Pt. 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 Pt.'s PMHx includes: Right carotid artery stenosis with stenting with further stenosis around the current stents, Esophageal CA, Type2 DM, Chronic back pain with  L3-L4, L5 Lumbar Decompression. Pt. Has a heart monitor placed.  PRECAUTIONS: None  WEIGHT BEARING RESRICTIONS: No  PAIN:  Are you having pain? No pain just tired today.  FALLS: Has patient fallen in last 6 months? No  LIVING ENVIRONMENT: Lives with: lives alone Lives in: House/apartment Stairs: level entry, 2 story Has following equipment at home: Single point cane, Environmental consultant - 2 wheeled, Environmental consultant - 4 wheeled, shower chair, Shower bench, and Grab bars  PLOF: Independent  PATIENT GOALS: To get his left hand back  OBJECTIVE:   HAND DOMINANCE: Right  ADLs:  Transfers/ambulation related to ADLs: Eating: Independent, modified technique for cutting food Grooming:  Independent UB Dressing:  Independent LB Dressing: Increased time to tie tennis shoes Toileting: Independent Bathing: Independent Tub Shower transfers:  Independent with walkin shower   IADLs: Shopping: Independent Light housekeeping:  Pt. Has a personal housekeeper Meal Prep: Minimal cooking(At baseline), able to do light meal prep. Medication management: Independent Financial management:  No change Handwriting:  Uses his dominant right hand  MOBILITY STATUS: Independent   Activity tolerance: Intact  FUNCTIONAL OUTCOME MEASURES: FOTO: 71 TR score: 70  UPPER EXTREMITY ROM:    Active ROM Right Eval WFL Left Eval [facility]  Shoulder flexion    Shoulder  abduction    Shoulder adduction    Shoulder extension    Shoulder internal rotation    Shoulder external rotation    Elbow flexion    Elbow extension    Wrist flexion    Wrist extension    Wrist ulnar deviation    Wrist radial deviation    Wrist pronation    Wrist supination    (Blank rows = not tested)  UPPER EXTREMITY MMT:     MMT Right eval Left eval  Shoulder flexion 4+ 4+  Shoulder abduction 4+ 4+  Shoulder adduction    Shoulder extension    Shoulder internal rotation    Shoulder external rotation    Middle trapezius    Lower trapezius    Elbow flexion 5 4+  Elbow extension 5 4+  Wrist flexion 5 4  Wrist extension 5 4  Wrist ulnar deviation    Wrist radial deviation    Wrist pronation 5 4  Wrist supination 5 4  (Blank rows = not tested)  HAND FUNCTION: Grip strength: Right: 59 lbs; Left: 46 lbs, Lateral pinch: Right: 19 lbs, Left: 20 lbs, and 3 point pinch: Right: 15 lbs, Left: 14 lbs  COORDINATION: 9 Hole Peg test: Right: 31 sec; Left: 45 sec  SENSATION: Positive numbness, and tingling Light touch: WFL Proprioception: WFL   EDEMA: N/A  MUSCLE TONE: Intact  COGNITION: Overall cognitive status: Within functional limits for tasks assessed  VISION: Subjective report:  Reports  increased  blurriness with distance, most notably  with driving  Pt. Reports having an eye exam scheduled VISION ASSESSMENT: To be further assessed in functional context  PERCEPTION: WFL  PRAXIS: Impaired: Motor planning   TODAY'S TREATMENT:                                                                                                                              DATE: 09/10/2022 Neuromuscular Reeducation:  Pt. Worked on using L tip pinch to pick up 1 inch circular pegs and placing them into a peg board placed at an incline using thumb thumb opposition alternating between the 2nd-5th digit. Pt. Removed the 1 inch circular pegs using tip pinch followed by translatory  movements moving the objects from palm to 2nd digit, and thumb before discarding them back into container. Pt. Worked on picking up 3/4" flat  marbles using tip pinch and storing them in the palm of their hand. Pt. Worked on discarding 3/4" marbles using translatory movements to move the marbles from the palm to the tip of the 2nd digit and thumb before placing them onto lid of container.   Therapeutic Exercise:  Pt. performed left gross gripping with a gross grip strengthener. Pt. Worked on sustaining grip while grasping pegs. The gripper was set to 23.4# of grip strength resistance for the 1st trial and adjusted to 28.9# for 2nd trial. Pt. Performed 2# dumbbell exercises including elbow extension/flexion, wrist flexion/extension, forearm pronation/supination, and radial/ulnar deviation. Pt. Performed 10 reps of each exercise for set 1. Pt. Performed 10 reps of elbow extension/flexion, wrist flexion/extension, and forearm pronation/supination, and 5 reps of radial/ulnar deviation for set 2.  PATIENT EDUCATION: Education details:  HEP progression (dumbbells for L elbow, forearm, wrist strengthening) Person educated: Patient Education method: Explanation, Demonstration, and Verbal cues, written handout Education comprehension: verbalized understanding, returned demonstration, and needs further education  HOME EXERCISE PROGRAM: Strengthening for L elbow, forearm, wrist using dumbbell  GOALS: Goals reviewed with patient? Yes  SHORT TERM GOALS: Target date: 10/01/2022               1. Pt. will increase FOTO score by 2 points to reflect Pt. perceived improvement with assessment specific ADL/IADL's.   Baseline: Eval: FOTO score: 71 with TR score: 70 Goal status: INITIAL  2.  Pt. Will be independent with HEPs of the LUE. Baseline: Eval: No current HEP Goal status: INITIAL  LONG TERM GOALS: Target date: 11/12/2022    Pt. Will increase LUE strength by 2 mm grades to assist with ADLs, and  IADLs Baseline: Eval: Left shoulder flexion: 4+, abduction: 4+, elbow flexion: 4+, extension:4+, forearm supination:4 , wrist extension: 4 Goal status: INITIAL  2.  Pt. Will increase left grip strength by 5# to be able to securely hold objects, and open containers, or jars. Baseline: Eval: Right: 59 Left: 46 Goal status: INITIAL  3.  Pt. Will improve left pinch strength to be able to be able to push his belt through his pant loop independently Baseline: Eval: Pinch strength: lateral: right: 19# left: 20#, 3pt.: Right: 15#, Left:14# Goal status: INITIAL  4.  Pt. Will improve left hand [facility] skills by 3 sec. Of speed to be able to button his shirt efficiently Baseline: Eval:  9 hole peg test: Right:  31 sec., Left: 45 sec. Goal status: INITIAL  5.  Pt. Will Pt. Will consistently, and efficiently reach for targets with the left UE with 100% accuracy. Baseline: Eval: Pt. presents with decreased motor control affecting the accuracy of reaching for objects with the left hand when challenged with speed. Goal status: INITIAL     ASSESSMENT:  CLINICAL IMPRESSION  Pt. Reported he was unable to begin completing dumbbell exercises at home because he can not find the weights and that he will continue to look for them. Pt. Was able to perform gross gripping with a gross gripping strengthener and was able to move up to 28.9# resistance on the second set. Pt. Was able to complete 2# dumbbell exercises however, required verbal cues and frequent rest breaks throughout to ensure proper form. Pt. Was able to manipulate the 1 inch circular pegs and place them onto pegboard, however required increased time and dropped a total of 2 pegs. Pt. Was able to pick up 3/4" marbles using tip pinch however required increased time when using translatory movements to move marbles  from palm to 2nd digit. Pt. Dropped a total of 4 marbles throughout the activity. Pt.continues to benefit from skilled OT services to work on  improving left hand function in order to increase engagement in, and maximize independence with ADLs, and IADL tasks.   PERFORMANCE DEFICITS: in functional skills including ADLs, IADLs, coordination, dexterity, proprioception, ROM, strength, and pain, cognitive skills including , and psychosocial skills including coping strategies and environmental adaptation.   IMPAIRMENTS: are limiting patient from ADLs, IADLs, and leisure.   CO-MORBIDITIES: may have co-morbidities  that affects occupational performance. Patient will benefit from skilled OT to address above impairments and improve overall function.  MODIFICATION OR ASSISTANCE TO COMPLETE EVALUATION: Min-Moderate modification of tasks or assist with assess necessary to complete an evaluation.  OT OCCUPATIONAL PROFILE AND HISTORY: Detailed assessment: Review of records and additional review of physical, cognitive, psychosocial history related to current functional performance.  CLINICAL DECISION MAKING: Moderate - several treatment options, min-mod task modification necessary  REHAB POTENTIAL: Good  EVALUATION COMPLEXITY: Moderate    PLAN:  OT FREQUENCY: 2x/week  OT DURATION: 12 weeks  PLANNED INTERVENTIONS: self care/ADL training, therapeutic exercise, therapeutic activity, neuromuscular re-education, manual therapy, and functional mobility training  RECOMMENDED OTHER SERVICES: N/A  CONSULTED AND AGREED WITH PLAN OF CARE: Patient  PLAN FOR NEXT SESSION: see above   Herma Carson, Student-OT 09/10/2022 1:57 PM  This entire session was performed under the direct supervision and direction of a licensed therapist. I have personally read, edited, and approve of the note as written.   Olegario Messier, MS, OTR/L  09/11/2022

## 2022-09-14 DIAGNOSIS — G4733 Obstructive sleep apnea (adult) (pediatric): Secondary | ICD-10-CM | POA: Diagnosis not present

## 2022-09-15 ENCOUNTER — Ambulatory Visit: Payer: Medicare HMO | Admitting: Occupational Therapy

## 2022-09-15 DIAGNOSIS — M6281 Muscle weakness (generalized): Secondary | ICD-10-CM

## 2022-09-15 DIAGNOSIS — M65341 Trigger finger, right ring finger: Secondary | ICD-10-CM | POA: Diagnosis not present

## 2022-09-15 DIAGNOSIS — R278 Other lack of coordination: Secondary | ICD-10-CM | POA: Diagnosis not present

## 2022-09-15 NOTE — Therapy (Addendum)
OUTPATIENT OCCUPATIONAL THERAPY NEURO TREATMENT NOTE Patient Name: Blake [name] MRN: [id number] DOB:Nov 26, 1945, 77 y.o., male Today's Date: 09/15/2022  PCP: Dr. Judithann Sheen, MD REFERRING PROVIDER: Dr. Cristopher Peru, MD  END OF SESSION:  OT End of Session - 09/15/22 1604     Visit Number 6    Number of Visits 24    Date for OT Re-Evaluation 11/12/22    OT Start Time 1515    OT Stop Time 1600    OT Time Calculation (min) 45 min    Activity Tolerance Patient tolerated treatment well    Behavior During Therapy [facility] for tasks assessed/performed             Past Medical History:  Diagnosis Date   Arthritis    Back pain    with leg pain   Basal cell carcinoma 09/04/2009   R cheek 5.5 cm ant to earlobe - 12/26/2009 excision   Basal cell carcinoma 09/04/2009   R ant nasal alar rim   Basal cell carcinoma 07/31/2014   R mid brow   Basal cell carcinoma 07/30/2016   R ant nasal alar rim at ant edge of BCC scar   Basal cell carcinoma 11/02/2019   L zygoma    Basal cell carcinoma 07/22/2021   left sideburn area, EDC   Basal cell carcinoma 01/02/2022   Right posterior shoulder, EDC   Basal cell carcinoma 01/02/2022   R posterior shoulder, EDC   Basal cell carcinoma 01/02/2022   Right Supraclavicular Base of Neck, EDC   Cancer of base of tongue (HCC) 2006   s/p chemoradiation   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, bilateral    Dyspnea    Dysrhythmia    Esophageal cancer (HCC) 2021   GERD (gastroesophageal reflux disease)    Hyperlipidemia    Hypertension    Numbness and tingling of both lower extremities    with positioning   Paroxysmal atrial fibrillation (HCC)    Port-A-Cath in place 2006   Prostate enlargement    Squamous cell carcinoma of skin 12/14/2019   L pretibial - ED&C    T2DM (type 2 diabetes mellitus) (HCC)    Past Surgical History:  Procedure Laterality Date   ABDOMINAL SURGERY     CAROTID PTA/STENT INTERVENTION Right 07/04/2021   Procedure:  CAROTID PTA/STENT INTERVENTION;  Surgeon: Annice Needy, MD;  Location: [facility];  Service: Cardiovascular;  Laterality: Right;   COLONOSCOPY  12/08/2003   COLONOSCOPY WITH PROPOFOL N/A 03/21/2019   Procedure: COLONOSCOPY WITH PROPOFOL;  Surgeon: Toledo, Boykin Nearing, MD;  Location: [facility];  Service: Gastroenterology;  Laterality: N/A;   ESOPHAGOGASTRODUODENOSCOPY (EGD) WITH PROPOFOL N/A 03/21/2019   Procedure: ESOPHAGOGASTRODUODENOSCOPY (EGD) WITH PROPOFOL;  Surgeon: Toledo, Boykin Nearing, MD;  Location: [facility];  Service: Gastroenterology;  Laterality: N/A;   esophogeal cancer     JOINT REPLACEMENT Right    total hip   LUMBAR LAMINECTOMY/DECOMPRESSION MICRODISCECTOMY N/A 03/03/2018   Procedure: LUMBAR LAMINECTOMY/DECOMPRESSION MICRODISCECTOMY 1 LEVEL-L3-4,L4-5;  Surgeon: Venetia Night, MD;  Location: [facility];  Service: Neurosurgery;  Laterality: N/A;   LUMBAR LAMINECTOMY/DECOMPRESSION MICRODISCECTOMY N/A 02/24/2020   Procedure: LEFT L3-4 MICRODISCECTOMY, L4-5 DECOMPRESSION;  Surgeon: Venetia Night, MD;  Location: [facility];  Service: Neurosurgery;  Laterality: N/A;   MAXIMUM ACCESS (MAS) TRANSFORAMINAL LUMBAR INTERBODY FUSION (TLIF) 1 LEVEL N/A 07/13/2020   Procedure: OPEN L3-4 TRANSFORAMINAL LUMBAR INTERBODY FUSION (TLIF);  Surgeon: Venetia Night, MD;  Location: [facility];  Service: Neurosurgery;  Laterality: N/A;   PARTIAL HIP  ARTHROPLASTY Right 2014   PORT-A-CATH REMOVAL     PORTACATH PLACEMENT Left 01/12/2020   Procedure: INSERTION PORT-A-CATH, with ultrasound fluoroscopy;  Surgeon: Hulda Marin, MD;  Location: [facility];  Service: General;  Laterality: Left;   TONGUE BIOPSY  2006   TONSILLECTOMY     TRIGGER FINGER RELEASE Right 2013   UPPER GI ENDOSCOPY  01/25/14   multiple gastric polyps   Patient Active Problem List   Diagnosis Date Noted   Hypocalcemia 06/02/2022   IDA (iron deficiency anemia) 05/29/2022   Loss of consciousness (HCC) 04/25/2022    Paresthesia 04/25/2022   Encephalomalacia on imaging study 04/25/2022   Positive D dimer 04/25/2022   Vasovagal syncope 04/25/2022   Acidosis 04/25/2022   Sinus bradycardia 04/25/2022   Anemia 04/25/2022   Paraseptal emphysema (HCC) 02/24/2022   Lung nodules 01/01/2022   Carotid stenosis, right 07/04/2021   Paroxysmal A-fib (HCC) 02/26/2021   Spondylolisthesis 07/13/2020   Chest pain 06/26/2020   Hypertensive urgency 06/26/2020   Shortness of breath 06/26/2020   Pneumonia 01/17/2020   Non-intractable vomiting 12/26/2019   Esophageal carcinoma (HCC) 12/09/2019   History of head and neck cancer 12/09/2019   Chronic pain syndrome 12/08/2019   History of lumbar laminectomy 12/08/2019   Supraventricular tachycardia 12/08/2019   Acute postoperative pain 09/13/2019   Malignant neoplasm of lower third of esophagus (HCC) 07/14/2019   Goals of care, counseling/discussion 05/04/2019   Osteoarthritis 04/26/2019   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   Bilateral hand numbness 12/16/2018   Bilateral hand pain 12/16/2018   Lumbar radiculopathy 06/02/2017   Lumbar degenerative disc disease 06/02/2017   Spinal stenosis, lumbar region, 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   Primary osteoarthritis of right shoulder 07/01/2016   Bilateral carotid artery stenosis 06/05/2016   Carotid stenosis 04/22/2016   Allergic rhinitis 08/24/2015   Diabetes mellitus (HCC) 08/24/2015   Personal history of malignant neoplasm of tongue 08/24/2015   HLD (hyperlipidemia) 08/24/2015   Gastric polyposis 08/24/2015   Stenosing tenosynovitis of finger 07/11/2014   Coronary artery disease 06/26/2014   Family history of cancer of digestive system 10/20/2006   Deficiency, 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  Reports has cortizone shots in his R hand earlier today and his hand is currently still numb. Pt accompanied by: self  PERTINENT HISTORY:   Pt. 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 Pt.'s PMHx includes: Right carotid artery stenosis with stenting with further stenosis around the current stents, Esophageal CA, Type2 DM, Chronic back pain with  L3-L4, L5 Lumbar Decompression. Pt. Has a heart monitor placed.  PRECAUTIONS: None  WEIGHT BEARING RESRICTIONS: No  PAIN:  Are you having pain? No pain, L arm is feeling tired today.  FALLS: Has patient fallen in last 6 months? No  LIVING ENVIRONMENT: Lives with: lives alone Lives in: House/apartment Stairs: level entry, 2 story Has following equipment at home: Single point cane, Environmental consultant - 2 wheeled, Environmental consultant - 4 wheeled, shower chair, Shower bench, and Grab bars  PLOF: Independent  PATIENT GOALS: To get his left hand back  OBJECTIVE:   HAND DOMINANCE: Right  ADLs:  Transfers/ambulation related to ADLs: Eating: Independent, modified technique for  cutting food Grooming: Independent UB Dressing:  Independent LB Dressing: Increased time to tie tennis shoes Toileting: Independent Bathing: Independent Tub Shower transfers:  Independent with walkin shower   IADLs: Shopping: Independent Light housekeeping:  Pt. Has a personal housekeeper Meal Prep: Minimal cooking(At baseline), able to do light meal prep. Medication management: Independent Financial management:  No change Handwriting:  Uses his dominant right hand  MOBILITY STATUS: Independent   Activity tolerance: Intact  FUNCTIONAL OUTCOME MEASURES: FOTO: 71 TR score: 70  UPPER EXTREMITY ROM:    Active ROM Right Eval WFL Left Eval [facility]  Shoulder flexion     Shoulder abduction    Shoulder adduction    Shoulder extension    Shoulder internal rotation    Shoulder external rotation    Elbow flexion    Elbow extension    Wrist flexion    Wrist extension    Wrist ulnar deviation    Wrist radial deviation    Wrist pronation    Wrist supination    (Blank rows = not tested)  UPPER EXTREMITY MMT:     MMT Right eval Left eval  Shoulder flexion 4+ 4+  Shoulder abduction 4+ 4+  Shoulder adduction    Shoulder extension    Shoulder internal rotation    Shoulder external rotation    Middle trapezius    Lower trapezius    Elbow flexion 5 4+  Elbow extension 5 4+  Wrist flexion 5 4  Wrist extension 5 4  Wrist ulnar deviation    Wrist radial deviation    Wrist pronation 5 4  Wrist supination 5 4  (Blank rows = not tested)  HAND FUNCTION: Grip strength: Right: 59 lbs; Left: 46 lbs, Lateral pinch: Right: 19 lbs, Left: 20 lbs, and 3 point pinch: Right: 15 lbs, Left: 14 lbs  COORDINATION: 9 Hole Peg test: Right: 31 sec; Left: 45 sec  SENSATION: Positive numbness, and tingling Light touch: WFL Proprioception: WFL   EDEMA: N/A  MUSCLE TONE: Intact  COGNITION: Overall cognitive status: Within functional limits for tasks assessed  VISION: Subjective report:  Reports  increased  blurriness with distance, most notably  with driving  Pt. Reports having an eye exam scheduled VISION ASSESSMENT: To be further assessed in functional context  PERCEPTION: WFL  PRAXIS: Impaired: Motor planning   TODAY'S TREATMENT:                                                                                                                              DATE: 09/15/2022 Neuromuscular Reeducation:  Pt. Worked on using L tip pinch to pick up 1 inch circular pegs and placing them into a peg board placed at an incline using thumb opposition alternating between the 2nd-5th digit. Pt. Removed the 1 inch circular pegs using tip pinch followed by translatory  movements moving the objects from palm to 2nd digit, and thumb before discarding them back into container. Pt. Worked on picking up  3/4" flat marbles using tip pinch and storing them in the palm of their hand. Pt. Worked on discarding 3/4" marbles using translatory movements to move the marbles from the palm to the tip of the 2nd digit and thumb before placing them onto lid of container. Pt. Was able to store 7 marbles within hand without dropping them.  Therapeutic Exercise:  Pt. performed left gross gripping with a gross grip strengthener. Pt. Worked on sustaining grip while grasping pegs. The gripper was set to 28.9 of grip strength and Pt. Performed 2 trials. Pt. Performed 3# dumbbell exercises including elbow extension/flexion, wrist flexion/extension, forearm pronation/supination, and radial/ulnar deviation. Pt. Performed 10 reps of each exercise for 2 sets. Pt. Completed 2nd set with 2# dumbbell.   PATIENT EDUCATION: Education details:  HEP progression (dumbbells for L elbow, forearm, wrist strengthening) Person educated: Patient Education method: Explanation, Demonstration, and Verbal cues, written handout Education comprehension: verbalized understanding, returned demonstration, and needs further education  HOME EXERCISE PROGRAM: Strengthening for L elbow, forearm, wrist using dumbbell  GOALS: Goals reviewed with patient? Yes  SHORT TERM GOALS: Target date: 10/01/2022               1. Pt. will increase FOTO score by 2 points to reflect Pt. perceived improvement with assessment specific ADL/IADL's.   Baseline: Eval: FOTO score: 71 with TR score: 70 Goal status: INITIAL  2.  Pt. Will be independent with HEPs of the LUE. Baseline: Eval: No current HEP Goal status: INITIAL  LONG TERM GOALS: Target date: 11/12/2022    Pt. Will increase LUE strength by 2 mm grades to assist with ADLs, and IADLs Baseline: Eval: Left shoulder flexion: 4+, abduction: 4+, elbow flexion: 4+, extension:4+,  forearm supination:4 , wrist extension: 4 Goal status: INITIAL  2.  Pt. Will increase left grip strength by 5# to be able to securely hold objects, and open containers, or jars. Baseline: Eval: Right: 59 Left: 46 Goal status: INITIAL  3.  Pt. Will improve left pinch strength to be able to be able to push his belt through his pant loop independently Baseline: Eval: Pinch strength: lateral: right: 19# left: 20#, 3pt.: Right: 15#, Left:14# Goal status: INITIAL  4.  Pt. Will improve left hand [facility] skills by 3 sec. Of speed to be able to button his shirt efficiently Baseline: Eval:  9 hole peg test: Right:  31 sec., Left: 45 sec. Goal status: INITIAL  5.  Pt. Will Pt. Will consistently, and efficiently reach for targets with the left UE with 100% accuracy. Baseline: Eval: Pt. presents with decreased motor control affecting the accuracy of reaching for objects with the left hand when challenged with speed. Goal status: INITIAL     ASSESSMENT:  CLINICAL IMPRESSION  Pt. reports having had a cortizone injection today in the right hand Pt. reported he is still unable to complete dumbbell exercises at home because he can not find the weights. Pt. Was able to perform gross gripping with a gross gripping strengthener at 28.9# resistance for both sets however dropped 3 pegs on the last set due to L hand fatigue. Pt. Was able to complete 3# dumbbell exercises however requested to move down to 2# for the second set. Pt. required frequent rest breaks throughout to ensure proper form. Pt. Was able to manipulate the 1 inch circular pegs and place them onto pegboard, however required increased time when using translatory movements. Pt. Was able to pick up 7 3/4" marbles using tip pinch however required increased  time when using translatory movements to move marbles from palm to 2nd digit. Pt. Dropped a total of 2 marbles throughout the activity. Pt.continues to benefit from skilled OT services to work on  improving left hand function in order to increase engagement in, and maximize independence with ADLs, and IADL tasks.   PERFORMANCE DEFICITS: in functional skills including ADLs, IADLs, coordination, dexterity, proprioception, ROM, strength, and pain, cognitive skills including , and psychosocial skills including coping strategies and environmental adaptation.   IMPAIRMENTS: are limiting patient from ADLs, IADLs, and leisure.   CO-MORBIDITIES: may have co-morbidities  that affects occupational performance. Patient will benefit from skilled OT to address above impairments and improve overall function.  MODIFICATION OR ASSISTANCE TO COMPLETE EVALUATION: Min-Moderate modification of tasks or assist with assess necessary to complete an evaluation.  OT OCCUPATIONAL PROFILE AND HISTORY: Detailed assessment: Review of records and additional review of physical, cognitive, psychosocial history related to current functional performance.  CLINICAL DECISION MAKING: Moderate - several treatment options, min-mod task modification necessary  REHAB POTENTIAL: Good  EVALUATION COMPLEXITY: Moderate    PLAN:  OT FREQUENCY: 2x/week  OT DURATION: 12 weeks  PLANNED INTERVENTIONS: self care/ADL training, therapeutic exercise, therapeutic activity, neuromuscular re-education, manual therapy, and functional mobility training  RECOMMENDED OTHER SERVICES: N/A  CONSULTED AND AGREED WITH PLAN OF CARE: Patient  PLAN FOR NEXT SESSION: see above   Herma Carson, Student-OT 09/15/2022 4:06 PM  This entire session was performed under the direct supervision and direction of a licensed therapist. I have personally read, edited, and approve of the note as written.   Olegario Messier, MS, OTR/L

## 2022-09-17 ENCOUNTER — Inpatient Hospital Stay: Payer: Medicare HMO | Attending: Oncology

## 2022-09-17 DIAGNOSIS — Z452 Encounter for adjustment and management of vascular access device: Secondary | ICD-10-CM | POA: Insufficient documentation

## 2022-09-17 DIAGNOSIS — Z8501 Personal history of malignant neoplasm of esophagus: Secondary | ICD-10-CM | POA: Diagnosis not present

## 2022-09-17 DIAGNOSIS — Z95828 Presence of other vascular implants and grafts: Secondary | ICD-10-CM

## 2022-09-17 MED ORDER — HEPARIN SOD (PORK) LOCK FLUSH 100 UNIT/ML IV SOLN
500.0000 [IU] | Freq: Once | INTRAVENOUS | Status: AC
  Administered 2022-09-17: 500 [IU] via INTRAVENOUS
  Filled 2022-09-17: qty 5

## 2022-09-17 MED ORDER — SODIUM CHLORIDE 0.9% FLUSH
10.0000 mL | Freq: Once | INTRAVENOUS | Status: AC
  Administered 2022-09-17: 10 mL via INTRAVENOUS
  Filled 2022-09-17: qty 10

## 2022-09-18 ENCOUNTER — Ambulatory Visit: Payer: Medicare HMO | Admitting: Occupational Therapy

## 2022-09-18 ENCOUNTER — Other Ambulatory Visit: Payer: Self-pay

## 2022-09-18 DIAGNOSIS — R278 Other lack of coordination: Secondary | ICD-10-CM | POA: Diagnosis not present

## 2022-09-18 DIAGNOSIS — M6281 Muscle weakness (generalized): Secondary | ICD-10-CM | POA: Diagnosis not present

## 2022-09-18 MED ORDER — AMOXICILLIN 500 MG PO CAPS
500.0000 mg | ORAL_CAPSULE | Freq: Four times a day (QID) | ORAL | 0 refills | Status: DC
  Filled 2022-09-18: qty 40, 10d supply, fill #0

## 2022-09-18 MED ORDER — CHLORHEXIDINE GLUCONATE 0.12 % MT SOLN
15.0000 mL | Freq: Two times a day (BID) | OROMUCOSAL | 12 refills | Status: DC
  Filled 2022-09-18: qty 473, 16d supply, fill #0

## 2022-09-18 NOTE — Therapy (Signed)
OUTPATIENT OCCUPATIONAL THERAPY NEURO TREATMENT NOTE Patient Name: Blake [name] MRN: [id number] DOB:09/08/1945, 77 y.o., male Today's Date: 09/18/2022  PCP: Dr. Judithann Sheen, MD REFERRING PROVIDER: Dr. Cristopher Peru, MD  END OF SESSION:  OT End of Session - 09/18/22 1935     Visit Number 7    Number of Visits 24    Date for OT Re-Evaluation 11/12/22    OT Start Time 1515    OT Stop Time 1600    OT Time Calculation (min) 45 min    Activity Tolerance Patient tolerated treatment well    Behavior During Therapy [facility] for tasks assessed/performed             Past Medical History:  Diagnosis Date   Arthritis    Back pain    with leg pain   Basal cell carcinoma 09/04/2009   R cheek 5.5 cm ant to earlobe - 12/26/2009 excision   Basal cell carcinoma 09/04/2009   R ant nasal alar rim   Basal cell carcinoma 07/31/2014   R mid brow   Basal cell carcinoma 07/30/2016   R ant nasal alar rim at ant edge of BCC scar   Basal cell carcinoma 11/02/2019   L zygoma    Basal cell carcinoma 07/22/2021   left sideburn area, EDC   Basal cell carcinoma 01/02/2022   Right posterior shoulder, EDC   Basal cell carcinoma 01/02/2022   R posterior shoulder, EDC   Basal cell carcinoma 01/02/2022   Right Supraclavicular Base of Neck, EDC   Cancer of base of tongue (HCC) 2006   s/p chemoradiation   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, bilateral    Dyspnea    Dysrhythmia    Esophageal cancer (HCC) 2021   GERD (gastroesophageal reflux disease)    Hyperlipidemia    Hypertension    Numbness and tingling of both lower extremities    with positioning   Paroxysmal atrial fibrillation (HCC)    Port-A-Cath in place 2006   Prostate enlargement    Squamous cell carcinoma of skin 12/14/2019   L pretibial - ED&C    T2DM (type 2 diabetes mellitus) (HCC)    Past Surgical History:  Procedure Laterality Date   ABDOMINAL SURGERY     CAROTID PTA/STENT INTERVENTION Right 07/04/2021   Procedure:  CAROTID PTA/STENT INTERVENTION;  Surgeon: Annice Needy, MD;  Location: [facility] INVASIVE CV [facility];  Service: Cardiovascular;  Laterality: Right;   COLONOSCOPY  12/08/2003   COLONOSCOPY WITH PROPOFOL N/A 03/21/2019   Procedure: COLONOSCOPY WITH PROPOFOL;  Surgeon: Toledo, Boykin Nearing, MD;  Location: [facility] ENDOSCOPY;  Service: Gastroenterology;  Laterality: N/A;   ESOPHAGOGASTRODUODENOSCOPY (EGD) WITH PROPOFOL N/A 03/21/2019   Procedure: ESOPHAGOGASTRODUODENOSCOPY (EGD) WITH PROPOFOL;  Surgeon: Toledo, Boykin Nearing, MD;  Location: [facility] ENDOSCOPY;  Service: Gastroenterology;  Laterality: N/A;   esophogeal cancer     JOINT REPLACEMENT Right    total hip   LUMBAR LAMINECTOMY/DECOMPRESSION MICRODISCECTOMY N/A 03/03/2018   Procedure: LUMBAR LAMINECTOMY/DECOMPRESSION MICRODISCECTOMY 1 LEVEL-L3-4,L4-5;  Surgeon: Venetia Night, MD;  Location: [facility] ORS;  Service: Neurosurgery;  Laterality: N/A;   LUMBAR LAMINECTOMY/DECOMPRESSION MICRODISCECTOMY N/A 02/24/2020   Procedure: LEFT L3-4 MICRODISCECTOMY, L4-5 DECOMPRESSION;  Surgeon: Venetia Night, MD;  Location: [facility] ORS;  Service: Neurosurgery;  Laterality: N/A;   MAXIMUM ACCESS (MAS) TRANSFORAMINAL LUMBAR INTERBODY FUSION (TLIF) 1 LEVEL N/A 07/13/2020   Procedure: OPEN L3-4 TRANSFORAMINAL LUMBAR INTERBODY FUSION (TLIF);  Surgeon: Venetia Night, MD;  Location: [facility] ORS;  Service: Neurosurgery;  Laterality: N/A;   PARTIAL HIP  ARTHROPLASTY Right 2014   PORT-A-CATH REMOVAL     PORTACATH PLACEMENT Left 01/12/2020   Procedure: INSERTION PORT-A-CATH, with ultrasound fluoroscopy;  Surgeon: Hulda Marin, MD;  Location: [facility] ORS;  Service: General;  Laterality: Left;   TONGUE BIOPSY  2006   TONSILLECTOMY     TRIGGER FINGER RELEASE Right 2013   UPPER GI ENDOSCOPY  01/25/14   multiple gastric polyps   Patient Active Problem List   Diagnosis Date Noted   Hypocalcemia 06/02/2022   IDA (iron deficiency anemia) 05/29/2022   Loss of consciousness (HCC) 04/25/2022    Paresthesia 04/25/2022   Encephalomalacia on imaging study 04/25/2022   Positive D dimer 04/25/2022   Vasovagal syncope 04/25/2022   Acidosis 04/25/2022   Sinus bradycardia 04/25/2022   Anemia 04/25/2022   Paraseptal emphysema (HCC) 02/24/2022   Lung nodules 01/01/2022   Carotid stenosis, right 07/04/2021   Paroxysmal A-fib (HCC) 02/26/2021   Spondylolisthesis 07/13/2020   Chest pain 06/26/2020   Hypertensive urgency 06/26/2020   Shortness of breath 06/26/2020   Pneumonia 01/17/2020   Non-intractable vomiting 12/26/2019   Esophageal carcinoma (HCC) 12/09/2019   History of head and neck cancer 12/09/2019   Chronic pain syndrome 12/08/2019   History of lumbar laminectomy 12/08/2019   Supraventricular tachycardia 12/08/2019   Acute postoperative pain 09/13/2019   Malignant neoplasm of lower third of esophagus (HCC) 07/14/2019   Goals of care, counseling/discussion 05/04/2019   Osteoarthritis 04/26/2019   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   Bilateral hand numbness 12/16/2018   Bilateral hand pain 12/16/2018   Lumbar radiculopathy 06/02/2017   Lumbar degenerative disc disease 06/02/2017   Spinal stenosis, lumbar region, 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   Primary osteoarthritis of right shoulder 07/01/2016   Bilateral carotid artery stenosis 06/05/2016   Carotid stenosis 04/22/2016   Allergic rhinitis 08/24/2015   Diabetes mellitus (HCC) 08/24/2015   Personal history of malignant neoplasm of tongue 08/24/2015   HLD (hyperlipidemia) 08/24/2015   Gastric polyposis 08/24/2015   Stenosing tenosynovitis of finger 07/11/2014   Coronary artery disease 06/26/2014   Family history of cancer of digestive system 10/20/2006   Deficiency, 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 Reports that he has started feeling better since he has started taking a medication for A-Fib prescribed by Dr. Judithann Sheen. Pt accompanied by: self  PERTINENT HISTORY:   Pt. 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 Pt.'s PMHx includes: Right carotid artery stenosis with stenting with further stenosis around the current stents, Esophageal CA, Type2 DM, Chronic back pain with  L3-L4, L5 Lumbar Decompression. Pt. Has a heart monitor placed.  PRECAUTIONS: None  WEIGHT BEARING RESRICTIONS: No  PAIN:  Are you having pain? No pain,  FALLS: Has patient fallen in last 6 months? No  LIVING ENVIRONMENT: Lives with: lives alone Lives in: House/apartment Stairs: level entry, 2 story Has following equipment at home: Single point cane, Environmental consultant - 2 wheeled, Environmental consultant - 4 wheeled, shower chair, Shower bench, and Grab bars  PLOF: Independent  PATIENT GOALS: To get his left hand back  OBJECTIVE:   HAND DOMINANCE: Right  ADLs:  Transfers/ambulation related to ADLs: Eating: Independent, modified technique  for cutting food Grooming: Independent UB Dressing:  Independent LB Dressing: Increased time to tie tennis shoes Toileting: Independent Bathing: Independent Tub Shower transfers:  Independent with walkin shower   IADLs: Shopping: Independent Light housekeeping:  Pt. Has a personal housekeeper Meal Prep: Minimal cooking(At baseline), able to do light meal prep. Medication management: Independent Financial management:  No change Handwriting:  Uses his dominant right hand  MOBILITY STATUS: Independent   Activity tolerance: Intact  FUNCTIONAL OUTCOME MEASURES: FOTO: 71 TR score: 70  UPPER EXTREMITY ROM:    Active ROM Right Eval WFL Left Eval [facility]   Shoulder flexion    Shoulder abduction    Shoulder adduction    Shoulder extension    Shoulder internal rotation    Shoulder external rotation    Elbow flexion    Elbow extension    Wrist flexion    Wrist extension    Wrist ulnar deviation    Wrist radial deviation    Wrist pronation    Wrist supination    (Blank rows = not tested)  UPPER EXTREMITY MMT:     MMT Right eval Left eval  Shoulder flexion 4+ 4+  Shoulder abduction 4+ 4+  Shoulder adduction    Shoulder extension    Shoulder internal rotation    Shoulder external rotation    Middle trapezius    Lower trapezius    Elbow flexion 5 4+  Elbow extension 5 4+  Wrist flexion 5 4  Wrist extension 5 4  Wrist ulnar deviation    Wrist radial deviation    Wrist pronation 5 4  Wrist supination 5 4  (Blank rows = not tested)  HAND FUNCTION: Grip strength: Right: 59 lbs; Left: 46 lbs, Lateral pinch: Right: 19 lbs, Left: 20 lbs, and 3 point pinch: Right: 15 lbs, Left: 14 lbs  COORDINATION: 9 Hole Peg test: Right: 31 sec; Left: 45 sec  SENSATION: Positive numbness, and tingling Light touch: WFL Proprioception: WFL   EDEMA: N/A  MUSCLE TONE: Intact  COGNITION: Overall cognitive status: Within functional limits for tasks assessed  VISION: Subjective report:  Reports  increased  blurriness with distance, most notably  with driving  Pt. Reports having an eye exam scheduled VISION ASSESSMENT: To be further assessed in functional context  PERCEPTION: WFL  PRAXIS: Impaired: Motor planning   TODAY'S TREATMENT:                                                                                                                              DATE: 09/15/2022 Neuromuscular Reeducation:   Pt. worked on [facility] skills manipulating nuts, and bolts on a bolt board with his vision occluded. Pt. Worked on screwing, and unscrewing nuts, and bolts of varying sizes, and challenging progressively smaller items.  Pt. Worked on flip  cards with his left hand while alternating thumb on fingers, and fingers on thumb movement patterns.   Therapeutic Exercise:   Pt. Worked on [employer] thearputty  ex. For hand strengthening. Exercises including: gross gripping, gross digit extension, thumb abduction, lateral, and 3pt. Pinch strengthening, digit abduction, and thumb opposition. Pt. Was provided with a visual handout HEP through [employer].   PATIENT EDUCATION: Education details:  HEP progression (dumbbells for L elbow, forearm, wrist strengthening) Person educated: Patient Education method: Explanation, Demonstration, and Verbal cues, written handout Education comprehension: verbalized understanding, returned demonstration, and needs further education  HOME EXERCISE PROGRAM: Strengthening for L elbow, forearm, wrist using dumbbell  GOALS: Goals reviewed with patient? Yes  SHORT TERM GOALS: Target date: 10/01/2022               1. Pt. will increase FOTO score by 2 points to reflect Pt. perceived improvement with assessment specific ADL/IADL's.   Baseline: Eval: FOTO score: 71 with TR score: 70 Goal status: INITIAL  2.  Pt. Will be independent with HEPs of the LUE. Baseline: Eval: No current HEP Goal status: INITIAL  LONG TERM GOALS: Target date: 11/12/2022    Pt. Will increase LUE strength by 2 mm grades to assist with ADLs, and IADLs Baseline: Eval: Left shoulder flexion: 4+, abduction: 4+, elbow flexion: 4+, extension:4+, forearm supination:4 , wrist extension: 4 Goal status: INITIAL  2.  Pt. Will increase left grip strength by 5# to be able to securely hold objects, and open containers, or jars. Baseline: Eval: Right: 59 Left: 46 Goal status: INITIAL  3.  Pt. Will improve left pinch strength to be able to be able to push his belt through his pant loop independently Baseline: Eval: Pinch strength: lateral: right: 19# left: 20#, 3pt.: Right: 15#, Left:14# Goal status: INITIAL  4.  Pt. Will improve left hand [facility]  skills by 3 sec. Of speed to be able to button his shirt efficiently Baseline: Eval:  9 hole peg test: Right:  31 sec., Left: 45 sec. Goal status: INITIAL  5.  Pt. Will Pt. Will consistently, and efficiently reach for targets with the left UE with 100% accuracy. Baseline: Eval: Pt. presents with decreased motor control affecting the accuracy of reaching for objects with the left hand when challenged with speed. Goal status: INITIAL     ASSESSMENT:  CLINICAL IMPRESSION  Pt. Tolerated the [employer] theraputty exercises well, however required verbal, and tactile cues for form and technique. Pt. Presented with difficulty manipulating the nuts from the bolts on the bolt board with his vision occluded. Pt. was able to flip cards with his left hand while alternating thumb on fingers, and fingers on thumb movement patterns. Pt. presented with decreased FMC when a speed component was added to increase the level of difficulty of the task. Pt. Was able to continue the task when dual tasking with talking while performing the task. Pt.continues to benefit from skilled OT services to work on improving left hand function in order to increase engagement in, and maximize independence with ADLs, and IADL tasks.   PERFORMANCE DEFICITS: in functional skills including ADLs, IADLs, coordination, dexterity, proprioception, ROM, strength, and pain, cognitive skills including , and psychosocial skills including coping strategies and environmental adaptation.   IMPAIRMENTS: are limiting patient from ADLs, IADLs, and leisure.   CO-MORBIDITIES: may have co-morbidities  that affects occupational performance. Patient will benefit from skilled OT to address above impairments and improve overall function.  MODIFICATION OR ASSISTANCE TO COMPLETE EVALUATION: Min-Moderate modification of tasks or assist with assess necessary to complete an evaluation.  OT OCCUPATIONAL PROFILE AND HISTORY: Detailed assessment: Review of records  and additional review of physical,  cognitive, psychosocial history related to current functional performance.  CLINICAL DECISION MAKING: Moderate - several treatment options, min-mod task modification necessary  REHAB POTENTIAL: Good  EVALUATION COMPLEXITY: Moderate    PLAN:  OT FREQUENCY: 2x/week  OT DURATION: 12 weeks  PLANNED INTERVENTIONS: self care/ADL training, therapeutic exercise, therapeutic activity, neuromuscular re-education, manual therapy, and functional mobility training  RECOMMENDED OTHER SERVICES: N/A  CONSULTED AND AGREED WITH PLAN OF CARE: Patient  PLAN FOR NEXT SESSION: see above   Olegario Messier, MS, OTR/L   09/18/2022

## 2022-09-22 ENCOUNTER — Ambulatory Visit: Payer: Medicare HMO

## 2022-09-22 DIAGNOSIS — H2513 Age-related nuclear cataract, bilateral: Secondary | ICD-10-CM | POA: Diagnosis not present

## 2022-09-22 DIAGNOSIS — M6281 Muscle weakness (generalized): Secondary | ICD-10-CM

## 2022-09-22 DIAGNOSIS — R278 Other lack of coordination: Secondary | ICD-10-CM

## 2022-09-22 DIAGNOSIS — H524 Presbyopia: Secondary | ICD-10-CM | POA: Diagnosis not present

## 2022-09-22 DIAGNOSIS — E119 Type 2 diabetes mellitus without complications: Secondary | ICD-10-CM | POA: Diagnosis not present

## 2022-09-22 DIAGNOSIS — H25013 Cortical age-related cataract, bilateral: Secondary | ICD-10-CM | POA: Diagnosis not present

## 2022-09-22 NOTE — Therapy (Signed)
OUTPATIENT OCCUPATIONAL THERAPY NEURO TREATMENT NOTE Patient Name: Blake [name] MRN: [id number] DOB:1945-08-01, 77 y.o., male Today's Date: 09/22/2022  PCP: Dr. Judithann Sheen, MD REFERRING PROVIDER: Dr. Cristopher Peru, MD  END OF SESSION:  OT End of Session - 09/22/22 1433     Visit Number 8    Number of Visits 24    Date for OT Re-Evaluation 11/12/22    OT Start Time 1435    OT Stop Time 1520    OT Time Calculation (min) 45 min    Activity Tolerance Patient tolerated treatment well    Behavior During Therapy [facility] for tasks assessed/performed             Past Medical History:  Diagnosis Date   Arthritis    Back pain    with leg pain   Basal cell carcinoma 09/04/2009   R cheek 5.5 cm ant to earlobe - 12/26/2009 excision   Basal cell carcinoma 09/04/2009   R ant nasal alar rim   Basal cell carcinoma 07/31/2014   R mid brow   Basal cell carcinoma 07/30/2016   R ant nasal alar rim at ant edge of BCC scar   Basal cell carcinoma 11/02/2019   L zygoma    Basal cell carcinoma 07/22/2021   left sideburn area, EDC   Basal cell carcinoma 01/02/2022   Right posterior shoulder, EDC   Basal cell carcinoma 01/02/2022   R posterior shoulder, EDC   Basal cell carcinoma 01/02/2022   Right Supraclavicular Base of Neck, EDC   Cancer of base of tongue (HCC) 2006   s/p chemoradiation   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, bilateral    Dyspnea    Dysrhythmia    Esophageal cancer (HCC) 2021   GERD (gastroesophageal reflux disease)    Hyperlipidemia    Hypertension    Numbness and tingling of both lower extremities    with positioning   Paroxysmal atrial fibrillation (HCC)    Port-A-Cath in place 2006   Prostate enlargement    Squamous cell carcinoma of skin 12/14/2019   L pretibial - ED&C    T2DM (type 2 diabetes mellitus) (HCC)    Past Surgical History:  Procedure Laterality Date   ABDOMINAL SURGERY     CAROTID PTA/STENT INTERVENTION Right 07/04/2021   Procedure:  CAROTID PTA/STENT INTERVENTION;  Surgeon: Annice Needy, MD;  Location: [facility];  Service: Cardiovascular;  Laterality: Right;   COLONOSCOPY  12/08/2003   COLONOSCOPY WITH PROPOFOL N/A 03/21/2019   Procedure: COLONOSCOPY WITH PROPOFOL;  Surgeon: Toledo, Boykin Nearing, MD;  Location: [facility] ENDOS[facility];  Service: Gastroenterology;  Laterality: N/A;   ESOPHAGOGASTRODUODENOSCOPY (EGD) WITH PROPOFOL N/A 03/21/2019   Procedure: ESOPHAGOGASTRODUODENOSCOPY (EGD) WITH PROPOFOL;  Surgeon: Toledo, Boykin Nearing, MD;  Location: [facility] ENDOS[facility];  Service: Gastroenterology;  Laterality: N/A;   esophogeal cancer     JOINT REPLACEMENT Right    total hip   LUMBAR LAMINECTOMY/DECOMPRESSION MICRODISCECTOMY N/A 03/03/2018   Procedure: LUMBAR LAMINECTOMY/DECOMPRESSION MICRODISCECTOMY 1 LEVEL-L3-4,L4-5;  Surgeon: Venetia Night, MD;  Location: [facility] ORS;  Service: Neurosurgery;  Laterality: N/A;   LUMBAR LAMINECTOMY/DECOMPRESSION MICRODISCECTOMY N/A 02/24/2020   Procedure: LEFT L3-4 MICRODISCECTOMY, L4-5 DECOMPRESSION;  Surgeon: Venetia Night, MD;  Location: [facility] OR[facility];  Service: Neurosurgery;  Laterality: N/A;   MAXIMUM ACCESS (MAS) TRANSFORAMINAL LUMBAR INTERBODY FUSION (TLIF) 1 LEVEL N/A 07/13/2020   Procedure: OPEN L3-4 TRANSFORAMINAL LUMBAR INTERBODY FUSION (TLIF);  Surgeon: Venetia Night, MD;  Location: [facility] OR[facility];  Service: Neurosurgery;  Laterality: N/A;   PARTIAL HIP  ARTHROPLASTY Right 2014   PORT-A-CATH REMOVAL     PORTACATH PLACEMENT Left 01/12/2020   Procedure: INSERTION PORT-A-CATH, with ultrasound fluoroscopy;  Surgeon: Hulda Marin, MD;  Location: [facility] ORS;  Service: General;  Laterality: Left;   TONGUE BIOPSY  2006   TONSILLECTOMY     TRIGGER FINGER RELEASE Right 2013   UPPER GI ENDOSCOPY  01/25/14   multiple gastric polyps   Patient Active Problem List   Diagnosis Date Noted   Hypocalcemia 06/02/2022   IDA (iron deficiency anemia) 05/29/2022   Loss of consciousness (HCC) 04/25/2022    Paresthesia 04/25/2022   Encephalomalacia on imaging study 04/25/2022   Positive D dimer 04/25/2022   Vasovagal syncope 04/25/2022   Acidosis 04/25/2022   Sinus bradycardia 04/25/2022   Anemia 04/25/2022   Paraseptal emphysema (HCC) 02/24/2022   Lung nodules 01/01/2022   Carotid stenosis, right 07/04/2021   Paroxysmal A-fib (HCC) 02/26/2021   Spondylolisthesis 07/13/2020   Chest pain 06/26/2020   Hypertensive urgency 06/26/2020   Shortness of breath 06/26/2020   Pneumonia 01/17/2020   Non-intractable vomiting 12/26/2019   Esophageal carcinoma (HCC) 12/09/2019   History of head and neck cancer 12/09/2019   Chronic pain syndrome 12/08/2019   History of lumbar laminectomy 12/08/2019   Supraventricular tachycardia 12/08/2019   Acute postoperative pain 09/13/2019   Malignant neoplasm of lower third of esophagus (HCC) 07/14/2019   Goals of care, counseling/discussion 05/04/2019   Osteoarthritis 04/26/2019   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   Bilateral hand numbness 12/16/2018   Bilateral hand pain 12/16/2018   Lumbar radiculopathy 06/02/2017   Lumbar degenerative disc disease 06/02/2017   Spinal stenosis, lumbar region, 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   Primary osteoarthritis of right shoulder 07/01/2016   Bilateral carotid artery stenosis 06/05/2016   Carotid stenosis 04/22/2016   Allergic rhinitis 08/24/2015   Diabetes mellitus (HCC) 08/24/2015   Personal history of malignant neoplasm of tongue 08/24/2015   HLD (hyperlipidemia) 08/24/2015   Gastric polyposis 08/24/2015   Stenosing tenosynovitis of finger 07/11/2014   Coronary artery disease 06/26/2014   Family history of cancer of digestive system 10/20/2006   Deficiency, 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 Pt reports buttoning his own shirts by looking in the mirror. Pt accompanied by: self  PERTINENT HISTORY:   Pt. 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 Pt.'s PMHx includes: Right carotid artery stenosis with stenting with further stenosis around the current stents, Esophageal CA, Type2 DM, Chronic back pain with  L3-L4, L5 Lumbar Decompression. Pt. Has a heart monitor placed.  PRECAUTIONS: None  WEIGHT BEARING RESRICTIONS: No  PAIN:  Are you having pain? No pain,  FALLS: Has patient fallen in last 6 months? No  LIVING ENVIRONMENT: Lives with: lives alone Lives in: House/apartment Stairs: level entry, 2 story Has following equipment at home: Single point cane, Environmental consultant - 2 wheeled, Environmental consultant - 4 wheeled, shower chair, Shower bench, and Grab bars  PLOF: Independent  PATIENT GOALS: To get his left hand back  OBJECTIVE:  HAND DOMINANCE: Right  ADLs:  Transfers/ambulation related to ADLs: Eating: Independent, modified technique for cutting food Grooming: Independent UB Dressing:  Independent LB Dressing: Increased time to tie tennis shoes Toileting: Independent Bathing: Independent Tub Shower transfers:  Independent with walkin shower   IADLs: Shopping: Independent Light housekeeping:  Pt. Has a personal housekeeper Meal Prep: Minimal cooking(At baseline), able to do light meal prep. Medication management: Independent Financial management:  No change Handwriting:  Uses his dominant right hand  MOBILITY STATUS: Independent   Activity tolerance: Intact  FUNCTIONAL OUTCOME MEASURES: FOTO: 71 TR score: 70  UPPER  EXTREMITY ROM:    Active ROM Right Eval WFL Left Eval [facility]  Shoulder flexion    Shoulder abduction    Shoulder adduction    Shoulder extension    Shoulder internal rotation    Shoulder external rotation    Elbow flexion    Elbow extension    Wrist flexion    Wrist extension    Wrist ulnar deviation    Wrist radial deviation    Wrist pronation    Wrist supination    (Blank rows = not tested)  UPPER EXTREMITY MMT:     MMT Right eval Left eval  Shoulder flexion 4+ 4+  Shoulder abduction 4+ 4+  Shoulder adduction    Shoulder extension    Shoulder internal rotation    Shoulder external rotation    Middle trapezius    Lower trapezius    Elbow flexion 5 4+  Elbow extension 5 4+  Wrist flexion 5 4  Wrist extension 5 4  Wrist ulnar deviation    Wrist radial deviation    Wrist pronation 5 4  Wrist supination 5 4  (Blank rows = not tested)  HAND FUNCTION: Grip strength: Right: 59 lbs; Left: 46 lbs, Lateral pinch: Right: 19 lbs, Left: 20 lbs, and 3 point pinch: Right: 15 lbs, Left: 14 lbs  COORDINATION: 9 Hole Peg test: Right: 31 sec; Left: 45 sec  SENSATION: Positive numbness, and tingling Light touch: WFL Proprioception: WFL   EDEMA: N/A  MUSCLE TONE: Intact  COGNITION: Overall cognitive status: Within functional limits for tasks assessed  VISION: Subjective report:  Reports  increased  blurriness with distance, most notably  with driving  Pt. Reports having an eye exam scheduled VISION ASSESSMENT: To be further assessed in functional context  PERCEPTION: WFL  PRAXIS: Impaired: Motor planning   TODAY'S TREATMENT:                                                                                                                              DATE: 09/22/2022 Therapeutic Activity:  Pt performed [facility] tasks using the Grooved pegboard. Pt worked on grasping the grooved pegs from a horizontal position and worked on Comptroller, moving the pegs to a  vertical position in the hand to prepare for placing them in the grooved slot. Difficulty manipulating in hand to place into slot, pt reports  decreased sensation and cataracts as main barrier. Pt removes pegs and stores up to 4 at a time in palm.  Pt completed x2 rounds of Connect 4 to facilitate shoulder flexion and horizontal ab/adduction for 5 minutes with 3 rest breaks. Pt reports increased pain/soreness reaching to shoulder high height.   Therapeutic Exercise:   Bilaterial shoulder cane stretches with 1lb bar to facilitate shoulder flexion and abduction 1x10 in sitting. Wrist extension, flexion, and radial deviation 1x10 with 2lb dumbbell; bodyweight scapular retraction/protraction with education on HEP. Pt reported minimal wrist pain and fatigue post exercises. Cues for technique.    PATIENT EDUCATION: Education details:  HEP progression (dumbbells for L elbow, forearm, wrist strengthening) Person educated: Patient Education method: Explanation, Demonstration, and Verbal cues, written handout Education comprehension: verbalized understanding, returned demonstration, and needs further education  HOME EXERCISE PROGRAM: Strengthening for L elbow, forearm, wrist using dumbbell  GOALS: Goals reviewed with patient? Yes  SHORT TERM GOALS: Target date: 10/01/2022               1. Pt. will increase FOTO score by 2 points to reflect Pt. perceived improvement with assessment specific ADL/IADL's.   Baseline: Eval: FOTO score: 71 with TR score: 70 Goal status: INITIAL  2.  Pt. Will be independent with HEPs of the LUE. Baseline: Eval: No current HEP Goal status: INITIAL  LONG TERM GOALS: Target date: 11/12/2022    Pt. Will increase LUE strength by 2 mm grades to assist with ADLs, and IADLs Baseline: Eval: Left shoulder flexion: 4+, abduction: 4+, elbow flexion: 4+, extension:4+, forearm supination:4 , wrist extension: 4 Goal status: INITIAL  2.  Pt. Will increase left grip strength by 5#  to be able to securely hold objects, and open containers, or jars. Baseline: Eval: Right: 59 Left: 46 Goal status: INITIAL  3.  Pt. Will improve left pinch strength to be able to be able to push his belt through his pant loop independently Baseline: Eval: Pinch strength: lateral: right: 19# left: 20#, 3pt.: Right: 15#, Left:14# Goal status: INITIAL  4.  Pt. Will improve left hand [facility] skills by 3 sec. Of speed to be able to button his shirt efficiently Baseline: Eval:  9 hole peg test: Right:  31 sec., Left: 45 sec. Goal status: INITIAL  5.  Pt. Will Pt. Will consistently, and efficiently reach for targets with the left UE with 100% accuracy. Baseline: Eval: Pt. presents with decreased motor control affecting the accuracy of reaching for objects with the left hand when challenged with speed. Goal status: INITIAL     ASSESSMENT:  CLINICAL IMPRESSION  Pt tolerated shoulder stretches and wrist exercises well with mild pain reported. Completed grooved pegboard with difficulty manipulating in hand and performing translatory skills. Pt stored up to 4 pegs in palm, reports impaired sensation/vision impairing abilities. Pt. continues to benefit from skilled OT services to work on improving left hand function in order to increase engagement in, and maximize independence with ADLs, and IADL tasks.   PERFORMANCE DEFICITS: in functional skills including ADLs, IADLs, coordination, dexterity, proprioception, ROM, strength, and pain, cognitive skills including , and psychosocial skills including coping strategies and environmental adaptation.   IMPAIRMENTS: are limiting patient from ADLs, IADLs, and leisure.   CO-MORBIDITIES: may have co-morbidities  that affects occupational performance. Patient will benefit from skilled OT to address above impairments and improve overall function.  MODIFICATION OR ASSISTANCE TO COMPLETE EVALUATION: Min-Moderate modification of tasks or assist with assess necessary to  complete an evaluation.  OT OCCUPATIONAL PROFILE AND HISTORY: Detailed assessment: Review of records and additional review of physical, cognitive, psychosocial history related to current functional performance.  CLINICAL DECISION MAKING: Moderate - several treatment options, min-mod task modification necessary  REHAB POTENTIAL: Good  EVALUATION COMPLEXITY: Moderate    PLAN:  OT FREQUENCY: 2x/week  OT DURATION: 12 weeks  PLANNED INTERVENTIONS: self care/ADL training, therapeutic exercise, therapeutic activity, neuromuscular re-education, manual therapy, and functional mobility training  RECOMMENDED OTHER SERVICES: N/A  CONSULTED AND AGREED WITH PLAN OF CARE: Patient  PLAN FOR NEXT SESSION: see above   Kathie Dike, M.S. OTR/L  09/22/22, 3:36 PM  ascom [phone]

## 2022-09-25 ENCOUNTER — Ambulatory Visit: Payer: Medicare HMO | Admitting: Occupational Therapy

## 2022-09-25 DIAGNOSIS — R278 Other lack of coordination: Secondary | ICD-10-CM

## 2022-09-25 DIAGNOSIS — M6281 Muscle weakness (generalized): Secondary | ICD-10-CM

## 2022-09-25 NOTE — Therapy (Addendum)
OUTPATIENT OCCUPATIONAL THERAPY NEURO TREATMENT NOTE Patient Name: Blake [name] MRN: [id number] DOB:27-May-1945, 77 y.o., male Today's Date: 09/25/2022  PCP: Dr. Judithann Sheen, MD REFERRING PROVIDER: Dr. Cristopher Peru, MD  END OF SESSION:  OT End of Session - 09/25/22 1605     Visit Number 9    Number of Visits 24    Date for OT Re-Evaluation 11/12/22    OT Start Time 1520    OT Stop Time 1600    OT Time Calculation (min) 40 min    Activity Tolerance Patient tolerated treatment well    Behavior During Therapy [facility] for tasks assessed/performed             Past Medical History:  Diagnosis Date   Arthritis    Back pain    with leg pain   Basal cell carcinoma 09/04/2009   R cheek 5.5 cm ant to earlobe - 12/26/2009 excision   Basal cell carcinoma 09/04/2009   R ant nasal alar rim   Basal cell carcinoma 07/31/2014   R mid brow   Basal cell carcinoma 07/30/2016   R ant nasal alar rim at ant edge of BCC scar   Basal cell carcinoma 11/02/2019   L zygoma    Basal cell carcinoma 07/22/2021   left sideburn area, EDC   Basal cell carcinoma 01/02/2022   Right posterior shoulder, EDC   Basal cell carcinoma 01/02/2022   R posterior shoulder, EDC   Basal cell carcinoma 01/02/2022   Right Supraclavicular Base of Neck, EDC   Cancer of base of tongue (HCC) 2006   s/p chemoradiation   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, bilateral    Dyspnea    Dysrhythmia    Esophageal cancer (HCC) 2021   GERD (gastroesophageal reflux disease)    Hyperlipidemia    Hypertension    Numbness and tingling of both lower extremities    with positioning   Paroxysmal atrial fibrillation (HCC)    Port-A-Cath in place 2006   Prostate enlargement    Squamous cell carcinoma of skin 12/14/2019   L pretibial - ED&C    T2DM (type 2 diabetes mellitus) (HCC)    Past Surgical History:  Procedure Laterality Date   ABDOMINAL SURGERY     CAROTID PTA/STENT INTERVENTION Right 07/04/2021   Procedure:  CAROTID PTA/STENT INTERVENTION;  Surgeon: Annice Needy, MD;  Location: [facility];  Service: Cardiovascular;  Laterality: Right;   COLONOSCOPY  12/08/2003   COLONOSCOPY WITH PROPOFOL N/A 03/21/2019   Procedure: COLONOSCOPY WITH PROPOFOL;  Surgeon: Toledo, Boykin Nearing, MD;  Location: [facility] ENDOS[facility];  Service: Gastroenterology;  Laterality: N/A;   ESOPHAGOGASTRODUODENOSCOPY (EGD) WITH PROPOFOL N/A 03/21/2019   Procedure: ESOPHAGOGASTRODUODENOSCOPY (EGD) WITH PROPOFOL;  Surgeon: Toledo, Boykin Nearing, MD;  Location: [facility] ENDOSCOPY;  Service: Gastroenterology;  Laterality: N/A;   esophogeal cancer     JOINT REPLACEMENT Right    total hip   LUMBAR LAMINECTOMY/DECOMPRESSION MICRODISCECTOMY N/A 03/03/2018   Procedure: LUMBAR LAMINECTOMY/DECOMPRESSION MICRODISCECTOMY 1 LEVEL-L3-4,L4-5;  Surgeon: Venetia Night, MD;  Location: [facility] ORS;  Service: Neurosurgery;  Laterality: N/A;   LUMBAR LAMINECTOMY/DECOMPRESSION MICRODISCECTOMY N/A 02/24/2020   Procedure: LEFT L3-4 MICRODISCECTOMY, L4-5 DECOMPRESSION;  Surgeon: Venetia Night, MD;  Location: [facility] OR[facility];  Service: Neurosurgery;  Laterality: N/A;   MAXIMUM ACCESS (MAS) TRANSFORAMINAL LUMBAR INTERBODY FUSION (TLIF) 1 LEVEL N/A 07/13/2020   Procedure: OPEN L3-4 TRANSFORAMINAL LUMBAR INTERBODY FUSION (TLIF);  Surgeon: Venetia Night, MD;  Location: [facility] OR[facility];  Service: Neurosurgery;  Laterality: N/A;   PARTIAL HIP  ARTHROPLASTY Right 2014   PORT-A-CATH REMOVAL     PORTACATH PLACEMENT Left 01/12/2020   Procedure: INSERTION PORT-A-CATH, with ultrasound fluoroscopy;  Surgeon: Hulda Marin, MD;  Location: [facility] ORS;  Service: General;  Laterality: Left;   TONGUE BIOPSY  2006   TONSILLECTOMY     TRIGGER FINGER RELEASE Right 2013   UPPER GI ENDOSCOPY  01/25/14   multiple gastric polyps   Patient Active Problem List   Diagnosis Date Noted   Hypocalcemia 06/02/2022   IDA (iron deficiency anemia) 05/29/2022   Loss of consciousness (HCC) 04/25/2022    Paresthesia 04/25/2022   Encephalomalacia on imaging study 04/25/2022   Positive D dimer 04/25/2022   Vasovagal syncope 04/25/2022   Acidosis 04/25/2022   Sinus bradycardia 04/25/2022   Anemia 04/25/2022   Paraseptal emphysema (HCC) 02/24/2022   Lung nodules 01/01/2022   Carotid stenosis, right 07/04/2021   Paroxysmal A-fib (HCC) 02/26/2021   Spondylolisthesis 07/13/2020   Chest pain 06/26/2020   Hypertensive urgency 06/26/2020   Shortness of breath 06/26/2020   Pneumonia 01/17/2020   Non-intractable vomiting 12/26/2019   Esophageal carcinoma (HCC) 12/09/2019   History of head and neck cancer 12/09/2019   Chronic pain syndrome 12/08/2019   History of lumbar laminectomy 12/08/2019   Supraventricular tachycardia 12/08/2019   Acute postoperative pain 09/13/2019   Malignant neoplasm of lower third of esophagus (HCC) 07/14/2019   Goals of care, counseling/discussion 05/04/2019   Osteoarthritis 04/26/2019   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   Bilateral hand numbness 12/16/2018   Bilateral hand pain 12/16/2018   Lumbar radiculopathy 06/02/2017   Lumbar degenerative disc disease 06/02/2017   Spinal stenosis, lumbar region, 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   Primary osteoarthritis of right shoulder 07/01/2016   Bilateral carotid artery stenosis 06/05/2016   Carotid stenosis 04/22/2016   Allergic rhinitis 08/24/2015   Diabetes mellitus (HCC) 08/24/2015   Personal history of malignant neoplasm of tongue 08/24/2015   HLD (hyperlipidemia) 08/24/2015   Gastric polyposis 08/24/2015   Stenosing tenosynovitis of finger 07/11/2014   Coronary artery disease 06/26/2014   Family history of cancer of digestive system 10/20/2006   Deficiency, 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 Pt. [place] Spaniel he has been doing theraputty exercises at home but recently has been taking a break from them since getting a tooth pulled out. Pt accompanied by: self  PERTINENT HISTORY:   Pt. 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 Pt.'s PMHx includes: Right carotid artery stenosis with stenting with further stenosis around the current stents, Esophageal CA, Type2 DM, Chronic back pain with  L3-L4, L5 Lumbar Decompression. Pt. Has a heart monitor placed.  PRECAUTIONS: None  WEIGHT BEARING RESRICTIONS: No  PAIN:  Are you having pain? No pain,  FALLS: Has patient fallen in last 6 months? No  LIVING ENVIRONMENT: Lives with: lives alone Lives in: House/apartment Stairs: level entry, 2 story Has following equipment at home: Single point cane, Environmental consultant - 2 wheeled, Environmental consultant - 4 wheeled, shower chair, Shower bench, and Grab bars  PLOF: Independent  PATIENT GOALS: To  get his left hand back  OBJECTIVE:   HAND DOMINANCE: Right  ADLs:  Transfers/ambulation related to ADLs: Eating: Independent, modified technique for cutting food Grooming: Independent UB Dressing:  Independent LB Dressing: Increased time to tie tennis shoes Toileting: Independent Bathing: Independent Tub Shower transfers:  Independent with walkin shower   IADLs: Shopping: Independent Light housekeeping:  Pt. Has a personal housekeeper Meal Prep: Minimal cooking(At baseline), able to do light meal prep. Medication management: Independent Financial management:  No change Handwriting:  Uses his dominant right hand  MOBILITY STATUS: Independent   Activity tolerance: Intact  FUNCTIONAL  OUTCOME MEASURES: FOTO: 71 TR score: 70  UPPER EXTREMITY ROM:    Active ROM Right Eval WFL Left Eval [facility]  Shoulder flexion    Shoulder abduction    Shoulder adduction    Shoulder extension    Shoulder internal rotation    Shoulder external rotation    Elbow flexion    Elbow extension    Wrist flexion    Wrist extension    Wrist ulnar deviation    Wrist radial deviation    Wrist pronation    Wrist supination    (Blank rows = not tested)  UPPER EXTREMITY MMT:     MMT Right eval Left eval  Shoulder flexion 4+ 4+  Shoulder abduction 4+ 4+  Shoulder adduction    Shoulder extension    Shoulder internal rotation    Shoulder external rotation    Middle trapezius    Lower trapezius    Elbow flexion 5 4+  Elbow extension 5 4+  Wrist flexion 5 4  Wrist extension 5 4  Wrist ulnar deviation    Wrist radial deviation    Wrist pronation 5 4  Wrist supination 5 4  (Blank rows = not tested)  HAND FUNCTION: Grip strength: Right: 59 lbs; Left: 46 lbs, Lateral pinch: Right: 19 lbs, Left: 20 lbs, and 3 point pinch: Right: 15 lbs, Left: 14 lbs  COORDINATION: 9 Hole Peg test: Right: 31 sec; Left: 45 sec  SENSATION: Positive numbness, and tingling Light touch: WFL Proprioception: WFL   EDEMA: N/A  MUSCLE TONE: Intact  COGNITION: Overall cognitive status: Within functional limits for tasks assessed  VISION: Subjective report:  Reports  increased  blurriness with distance, most notably  with driving  Pt. Reports having an eye exam scheduled VISION ASSESSMENT: To be further assessed in functional context  PERCEPTION: WFL  PRAXIS: Impaired: Motor planning   TODAY'S TREATMENT:                                                                                                                              DATE: 09/25/2022 Neuromuscular Reeducation:  Pt. Worked on using L tip pinch to pick up 1 inch circular pegs followed by translatory movements moving the objects from  palm to 2nd digit before placing them into a peg board. Pt. Removed the 1 inch circular pegs using tip pinch and  thumb opposition alternating between the 2nd-5th digit.   Therapeutic Exercise:  Pt. performed left gross gripping with a gross grip strengthener. Pt. Worked on sustaining grip while grasping pegs. The gripper was set to 28.9# but was adjusted shortly after beginning to 23.4# of grip strength and Pt. Performed 2 trials. Pt. Performed 3# dumbbell exercises including elbow extension/flexion, wrist flexion/extension, forearm pronation/supination, and radial/ulnar deviation. Pt. Performed 10 reps of each exercise for 2 sets.    PATIENT EDUCATION: Education details:  HEP progression (dumbbells for L elbow, forearm, wrist strengthening) Person educated: Patient Education method: Explanation, Demonstration, and Verbal cues, written handout Education comprehension: verbalized understanding, returned demonstration, and needs further education  HOME EXERCISE PROGRAM: Strengthening for L elbow, forearm, wrist using dumbbell  GOALS: Goals reviewed with patient? Yes  SHORT TERM GOALS: Target date: 10/01/2022               1. Pt. will increase FOTO score by 2 points to reflect Pt. perceived improvement with assessment specific ADL/IADL's.   Baseline: Eval: FOTO score: 71 with TR score: 70 Goal status: INITIAL  2.  Pt. Will be independent with HEPs of the LUE. Baseline: Eval: No current HEP Goal status: INITIAL  LONG TERM GOALS: Target date: 11/12/2022    Pt. Will increase LUE strength by 2 mm grades to assist with ADLs, and IADLs Baseline: Eval: Left shoulder flexion: 4+, abduction: 4+, elbow flexion: 4+, extension:4+, forearm supination:4 , wrist extension: 4 Goal status: INITIAL  2.  Pt. Will increase left grip strength by 5# to be able to securely hold objects, and open containers, or jars. Baseline: Eval: Right: 59 Left: 46 Goal status: INITIAL  3.  Pt. Will improve left pinch  strength to be able to be able to push his belt through his pant loop independently Baseline: Eval: Pinch strength: lateral: right: 19# left: 20#, 3pt.: Right: 15#, Left:14# Goal status: INITIAL  4.  Pt. Will improve left hand [facility] skills by 3 sec. Of speed to be able to button his shirt efficiently Baseline: Eval:  9 hole peg test: Right:  31 sec., Left: 45 sec. Goal status: INITIAL  5.  Pt. Will Pt. Will consistently, and efficiently reach for targets with the left UE with 100% accuracy. Baseline: Eval: Pt. presents with decreased motor control affecting the accuracy of reaching for objects with the left hand when challenged with speed. Goal status: INITIAL     ASSESSMENT:  CLINICAL IMPRESSION Pt. Reported that his L hand is feeling very weak and tired today. Pt. performed gross gripping with a gross gripping strengthener at 23.4# resistance for both sets. Pt. dropped 2 pegs on the last set due to L hand fatigue and frequent rest breaks were required. Pt. Was able to complete 3# dumbbell exercises however Pt. required frequent rest breaks throughout to ensure proper form. Pt. Was able to use tip pinch to place circular 1 inch pegs onto peg board however required increased time due to L hand fatigue and translatory movements. Pt.continues to benefit from skilled OT services to work on improving left hand function in order to increase engagement in, and maximize independence with ADLs, and IADL tasks.   PERFORMANCE DEFICITS: in functional skills including ADLs, IADLs, coordination, dexterity, proprioception, ROM, strength, and pain, cognitive skills including , and psychosocial skills including coping strategies and environmental adaptation.   IMPAIRMENTS: are limiting patient from ADLs, IADLs, and leisure.   CO-MORBIDITIES: may have co-morbidities  that affects occupational performance. Patient will benefit from skilled  OT to address above impairments and improve overall  function.  MODIFICATION OR ASSISTANCE TO COMPLETE EVALUATION: Min-Moderate modification of tasks or assist with assess necessary to complete an evaluation.  OT OCCUPATIONAL PROFILE AND HISTORY: Detailed assessment: Review of records and additional review of physical, cognitive, psychosocial history related to current functional performance.  CLINICAL DECISION MAKING: Moderate - several treatment options, min-mod task modification necessary  REHAB POTENTIAL: Good  EVALUATION COMPLEXITY: Moderate    PLAN:  OT FREQUENCY: 2x/week  OT DURATION: 12 weeks  PLANNED INTERVENTIONS: self care/ADL training, therapeutic exercise, therapeutic activity, neuromuscular re-education, manual therapy, and functional mobility training  RECOMMENDED OTHER SERVICES: N/A  CONSULTED AND AGREED WITH PLAN OF CARE: Patient  PLAN FOR NEXT SESSION: see above  Herma Carson OTS 4:08 PM 09/25/22  This entire session was performed under the direct supervision and direction of a licensed therapist. I have personally read, edited, and approve of the note as written.   Olegario Messier, MS, OTR/L

## 2022-09-29 ENCOUNTER — Ambulatory Visit: Payer: Medicare HMO | Attending: Neurology | Admitting: Occupational Therapy

## 2022-09-29 DIAGNOSIS — M6281 Muscle weakness (generalized): Secondary | ICD-10-CM | POA: Diagnosis not present

## 2022-09-29 DIAGNOSIS — R278 Other lack of coordination: Secondary | ICD-10-CM | POA: Diagnosis not present

## 2022-09-29 NOTE — Therapy (Addendum)
Occupational Therapy Progress Note  Dates of reporting period  08/20/2022   to   09/29/2022  Patient Name: Blake [name] MRN: [id number] DOB:1946/02/08, 77 y.o., male Today's Date: 09/29/2022  PCP: Dr. Judithann Sheen, MD REFERRING PROVIDER: Dr. Cristopher Peru, MD  END OF SESSION:  OT End of Session - 09/29/22 1704     Visit Number 10    Number of Visits 24    Date for OT Re-Evaluation 11/12/22    OT Start Time 1515    OT Stop Time 1600    OT Time Calculation (min) 45 min    Activity Tolerance Patient tolerated treatment well    Behavior During Therapy [facility] for tasks assessed/performed             Past Medical History:  Diagnosis Date   Arthritis    Back pain    with leg pain   Basal cell carcinoma 09/04/2009   R cheek 5.5 cm ant to earlobe - 12/26/2009 excision   Basal cell carcinoma 09/04/2009   R ant nasal alar rim   Basal cell carcinoma 07/31/2014   R mid brow   Basal cell carcinoma 07/30/2016   R ant nasal alar rim at ant edge of BCC scar   Basal cell carcinoma 11/02/2019   L zygoma    Basal cell carcinoma 07/22/2021   left sideburn area, EDC   Basal cell carcinoma 01/02/2022   Right posterior shoulder, EDC   Basal cell carcinoma 01/02/2022   R posterior shoulder, EDC   Basal cell carcinoma 01/02/2022   Right Supraclavicular Base of Neck, EDC   Cancer of base of tongue (HCC) 2006   s/p chemoradiation   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, bilateral    Dyspnea    Dysrhythmia    Esophageal cancer (HCC) 2021   GERD (gastroesophageal reflux disease)    Hyperlipidemia    Hypertension    Numbness and tingling of both lower extremities    with positioning   Paroxysmal atrial fibrillation (HCC)    Port-A-Cath in place 2006   Prostate enlargement    Squamous cell carcinoma of skin 12/14/2019   L pretibial - ED&C    T2DM (type 2 diabetes mellitus) (HCC)    Past Surgical History:  Procedure Laterality Date   ABDOMINAL SURGERY     CAROTID PTA/STENT  INTERVENTION Right 07/04/2021   Procedure: CAROTID PTA/STENT INTERVENTION;  Surgeon: Annice Needy, MD;  Location: [facility];  Service: Cardiovascular;  Laterality: Right;   COLONOSCOPY  12/08/2003   COLONOSCOPY WITH PROPOFOL N/A 03/21/2019   Procedure: COLONOSCOPY WITH PROPOFOL;  Surgeon: Toledo, Boykin Nearing, MD;  Location: [facility] ENDOS[facility];  Service: Gastroenterology;  Laterality: N/A;   ESOPHAGOGASTRODUODENOSCOPY (EGD) WITH PROPOFOL N/A 03/21/2019   Procedure: ESOPHAGOGASTRODUODENOSCOPY (EGD) WITH PROPOFOL;  Surgeon: Toledo, Boykin Nearing, MD;  Location: [facility] ENDOS[facility];  Service: Gastroenterology;  Laterality: N/A;   esophogeal cancer     JOINT REPLACEMENT Right    total hip   LUMBAR LAMINECTOMY/DECOMPRESSION MICRODISCECTOMY N/A 03/03/2018   Procedure: LUMBAR LAMINECTOMY/DECOMPRESSION MICRODISCECTOMY 1 LEVEL-L3-4,L4-5;  Surgeon: Venetia Night, MD;  Location: [facility] ORS;  Service: Neurosurgery;  Laterality: N/A;   LUMBAR LAMINECTOMY/DECOMPRESSION MICRODISCECTOMY N/A 02/24/2020   Procedure: LEFT L3-4 MICRODISCECTOMY, L4-5 DECOMPRESSION;  Surgeon: Venetia Night, MD;  Location: [facility] ORS;  Service: Neurosurgery;  Laterality: N/A;   MAXIMUM ACCESS (MAS) TRANSFORAMINAL LUMBAR INTERBODY FUSION (TLIF) 1 LEVEL N/A 07/13/2020   Procedure: OPEN L3-4 TRANSFORAMINAL LUMBAR INTERBODY FUSION (TLIF);  Surgeon: Venetia Night, MD;  Location:  [facility] OR[facility];  Service: Neurosurgery;  Laterality: N/A;   PARTIAL HIP ARTHROPLASTY Right 2014   PORT-A-CATH REMOVAL     PORTACATH PLACEMENT Left 01/12/2020   Procedure: INSERTION PORT-A-CATH, with ultrasound fluoroscopy;  Surgeon: Hulda Marin, MD;  Location: [facility] ORS;  Service: General;  Laterality: Left;   TONGUE BIOPSY  2006   TONSILLECTOMY     TRIGGER FINGER RELEASE Right 2013   UPPER GI ENDOSCOPY  01/25/14   multiple gastric polyps   Patient Active Problem List   Diagnosis Date Noted   Hypocalcemia 06/02/2022   IDA (iron deficiency anemia) 05/29/2022    Loss of consciousness (HCC) 04/25/2022   Paresthesia 04/25/2022   Encephalomalacia on imaging study 04/25/2022   Positive D dimer 04/25/2022   Vasovagal syncope 04/25/2022   Acidosis 04/25/2022   Sinus bradycardia 04/25/2022   Anemia 04/25/2022   Paraseptal emphysema (HCC) 02/24/2022   Lung nodules 01/01/2022   Carotid stenosis, right 07/04/2021   Paroxysmal A-fib (HCC) 02/26/2021   Spondylolisthesis 07/13/2020   Chest pain 06/26/2020   Hypertensive urgency 06/26/2020   Shortness of breath 06/26/2020   Pneumonia 01/17/2020   Non-intractable vomiting 12/26/2019   Esophageal carcinoma (HCC) 12/09/2019   History of head and neck cancer 12/09/2019   Chronic pain syndrome 12/08/2019   History of lumbar laminectomy 12/08/2019   Supraventricular tachycardia 12/08/2019   Acute postoperative pain 09/13/2019   Malignant neoplasm of lower third of esophagus (HCC) 07/14/2019   Goals of care, counseling/discussion 05/04/2019   Osteoarthritis 04/26/2019   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   Bilateral hand numbness 12/16/2018   Bilateral hand pain 12/16/2018   Lumbar radiculopathy 06/02/2017   Lumbar degenerative disc disease 06/02/2017   Spinal stenosis, lumbar region, 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   Primary osteoarthritis of right shoulder 07/01/2016   Bilateral carotid artery stenosis 06/05/2016   Carotid stenosis 04/22/2016   Allergic rhinitis 08/24/2015   Diabetes mellitus (HCC) 08/24/2015   Personal history of malignant neoplasm of tongue 08/24/2015   HLD (hyperlipidemia) 08/24/2015   Gastric polyposis 08/24/2015   Stenosing tenosynovitis of finger 07/11/2014   Coronary artery disease 06/26/2014   Family history of cancer of digestive system 10/20/2006   Deficiency, 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 Pt reports being sick to his stomach today. Pt accompanied by: self  PERTINENT HISTORY:   Pt. 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 Pt.'s PMHx includes: Right carotid artery stenosis with stenting with further stenosis around the current stents, Esophageal CA, Type2 DM, Chronic back pain with  L3-L4, L5 Lumbar Decompression. Pt. Has a heart monitor placed.  PRECAUTIONS: None  WEIGHT BEARING RESRICTIONS: No  PAIN:  Are you having pain? 4/10 pain when doing certain movements that require him to move L shoulder.  FALLS: Has patient fallen in last 6 months? No  LIVING ENVIRONMENT: Lives with: lives alone Lives in: House/apartment Stairs: level entry, 2 story Has following equipment at home: Single point cane, Environmental consultant - 2 wheeled, Environmental consultant - 4 wheeled, shower chair, Shower bench, and Grab bars  PLOF: Independent  PATIENT GOALS: To get his left hand back  OBJECTIVE:   HAND DOMINANCE: Right  ADLs:  Transfers/ambulation related to ADLs: Eating: Independent, modified technique for cutting food Grooming: Independent UB Dressing:  Independent LB Dressing: Increased time to tie tennis shoes Toileting: Independent Bathing: Independent Tub Shower transfers:  Independent with walkin shower   IADLs: Shopping: Independent Light housekeeping:  Pt. Has a personal housekeeper Meal Prep: Minimal cooking(At baseline), able to do light meal prep. Medication management: Independent Financial management:  No change Handwriting:  Uses his dominant right hand  MOBILITY STATUS: Independent   Activity  tolerance: Intact  FUNCTIONAL OUTCOME MEASURES: FOTO: 71 TR score: 70  UPPER EXTREMITY ROM:    Active ROM Right Eval WFL Left Eval [facility]  Shoulder flexion    Shoulder abduction    Shoulder adduction    Shoulder extension    Shoulder internal rotation    Shoulder external rotation    Elbow flexion    Elbow extension    Wrist flexion    Wrist extension    Wrist ulnar deviation    Wrist radial deviation    Wrist pronation    Wrist supination    (Blank rows = not tested)  UPPER EXTREMITY MMT:     MMT Right eval Left eval Right 09/29/22 Left 09/29/22  Shoulder flexion 4+ 4+ 5 4+  Shoulder abduction 4+ 4+ 4+ 4+  Shoulder adduction      Shoulder extension      Shoulder internal rotation      Shoulder external rotation      Middle trapezius      Lower trapezius      Elbow flexion 5 4+ 5 5  Elbow extension 5 4+ 5 5  Wrist flexion 5 4 5  4+  Wrist extension 5 4 5  4+  Wrist ulnar deviation      Wrist radial deviation      Wrist pronation 5 4 5    Wrist supination 5 4 5    (Blank rows = not tested)  HAND FUNCTION: Grip strength: Right: 59 lbs; Left: 46 lbs, Lateral pinch: Right: 19 lbs, Left: 20 lbs, and 3 point pinch: Right: 15 lbs, Left: 14 lbs Grip strength: Right: 59 lbs; Left: 45 lbs, Lateral pinch: Right: 19 lbs, Left: 17 lbs, and 3 point pinch: Right: 17 lbs, Left: 13 lbs  COORDINATION: 9 Hole Peg test: Right: 31 sec; Left: 45 sec 9 Hole Peg test: Right: 31 sec; Left: 55 sec  SENSATION: Positive numbness, and tingling Light touch: WFL Proprioception: WFL   EDEMA: N/A  MUSCLE TONE: Intact  COGNITION: Overall cognitive status: Within functional limits for tasks assessed  VISION: Subjective report:  Reports  increased  blurriness with distance, most notably  with driving  Pt. Reports having an eye exam scheduled VISION ASSESSMENT: To be further assessed in functional context  PERCEPTION: WFL  PRAXIS: Impaired: Motor planning   TODAY'S TREATMENT:  DATE: 09/29/2022  Measurements were obtained and goals reviewed  Neuromuscular Reeducation:  Pt. Worked on using tip pinch to pick up 3/4" flat marbles followed by translatory movements to move the marble from the palm to the 2nd digit before discarding onto the top of the container.    Therapeutic Exercise:  Pt. performed left gross gripping with a gross grip strengthener. Pt. Worked on sustaining grip while grasping pegs. The gripper was set to 23.4# of grip strength and Pt. Performed 2 trials. PATIENT EDUCATION: Education details:  HEP progression (dumbbells for L elbow, forearm, wrist strengthening) Person educated: Patient Education method: Explanation, Demonstration, and Verbal cues, written handout Education comprehension: verbalized understanding, returned demonstration, and needs further education  HOME EXERCISE PROGRAM: Strengthening for L elbow, forearm, wrist using dumbbell, theraputty exercises for L hand strengthening  GOALS: Goals reviewed with patient? Yes  SHORT TERM GOALS: Target date: 11/10/22               1. Pt. will increase FOTO score by 2 points to reflect Pt. perceived improvement with assessment specific ADL/IADL's.   Baseline: Eval: FOTO score: 71 with TR score: 70     09/29/22: FOTO score: 63 with TR score: 70 Goal status: Ongoing  2.  Pt. Will be independent with HEPs of the LUE. Baseline: Eval: No current HEP 09/29/22: Completing theraputty exercises at home but not dumbbell exercises Goal status: Ongoing  LONG TERM GOALS: Target date: 11/12/2022    Pt. Will increase LUE strength by 2 mm grades to assist with ADLs, and IADLs Baseline: Eval: Left shoulder flexion: 4+, abduction: 4+, elbow flexion: 4+, extension:4+, forearm supination:4 , wrist extension: 4 09/29/22: Left shoulder flexion: 4+, abduction: 4+, elbow flexion: 5, extension:5 ,  wrist extension: 4+ Goal status: Ongoing  2.  Pt. Will increase left grip strength by 5# to be able to securely hold objects, and open containers, or jars. Baseline: Eval: Right: 59 Left: 46  09/29/22: Right: 59 lbs; Left: 45 lbs Goal status: Ongoing  3.  Pt. Will improve left pinch strength to be able to be able to push his belt through his pant loop independently Baseline: Eval: Pinch strength: lateral: right: 19# left: 20#, 3pt.: Right: 15#, Left:14#  09/29/22: Lateral pinch: Right: 19 lbs, Left: 17 lbs, and 3 point pinch: Right: 17 lbs, Left: 13 lbs Goal status: Ongoing  4.  Pt. Will improve left hand [facility] skills by 3 sec. Of speed to be able to button his shirt efficiently Baseline: Eval:  9 hole peg test: Right:  31 sec., Left: 45 sec. 09/29/22: 9 Hole Peg test: Right: 31 sec; Left: 55 sec Goal status: Ongoing  5.  Pt. Will Pt. Will consistently, and efficiently reach for targets with the left UE with 100% accuracy. Baseline: Eval: Pt. presents with decreased motor control affecting the accuracy of reaching for objects with the left hand when challenged with speed. Goal status: Ongoing     ASSESSMENT:  CLINICAL IMPRESSION Pt. Reported that his L hand is feeling weak today and that he has been sick to his stomach today. Measurements were obtained and  goals were reviewed with the Pt. Pt. Has shown improvement in overall strength of LUE however still presents with decreased grip strength, pinch strength and coordination in the LUE. Pt. Illness today may be impacting measurements, and objective data. Pt. performed gross gripping with a gross gripping strengthener at 23.4# resistance for both sets. Pt. dropped 2 pegs when performing both sets due L hand fatigue  and frequent rest breaks were required. Pt. Was able to use tip pinch to pick up 3/4" flat marbles however required increased verbal cues to ensure translatory movements were being used before discarding the marble onto the top of the  container. Pt. Dropped several marbles throughout completing the task. Pt.continues to benefit from skilled OT services to work on improving left hand function in order to increase engagement in, and maximize independence with ADLs, and IADL tasks.   PERFORMANCE DEFICITS: in functional skills including ADLs, IADLs, coordination, dexterity, proprioception, ROM, strength, and pain, cognitive skills including , and psychosocial skills including coping strategies and environmental adaptation.   IMPAIRMENTS: are limiting patient from ADLs, IADLs, and leisure.   CO-MORBIDITIES: may have co-morbidities  that affects occupational performance. Patient will benefit from skilled OT to address above impairments and improve overall function.  MODIFICATION OR ASSISTANCE TO COMPLETE EVALUATION: Min-Moderate modification of tasks or assist with assess necessary to complete an evaluation.  OT OCCUPATIONAL PROFILE AND HISTORY: Detailed assessment: Review of records and additional review of physical, cognitive, psychosocial history related to current functional performance.  CLINICAL DECISION MAKING: Moderate - several treatment options, min-mod task modification necessary  REHAB POTENTIAL: Good  EVALUATION COMPLEXITY: Moderate    PLAN:  OT FREQUENCY: 2x/week  OT DURATION: 12 weeks  PLANNED INTERVENTIONS: self care/ADL training, therapeutic exercise, therapeutic activity, neuromuscular re-education, manual therapy, and functional mobility training  RECOMMENDED OTHER SERVICES: N/A  CONSULTED AND AGREED WITH PLAN OF CARE: Patient  PLAN FOR NEXT SESSION: see above  Herma Carson OTS 5:10 PM 09/29/22  This entire session was performed under the direct supervision and direction of a licensed therapist. I have personally read, edited, and approve of the note as written.   Olegario Messier, MS, OTR/L  09/29/2022

## 2022-10-01 ENCOUNTER — Ambulatory Visit: Payer: Medicare HMO | Admitting: Occupational Therapy

## 2022-10-06 ENCOUNTER — Ambulatory Visit: Payer: Medicare HMO | Admitting: Occupational Therapy

## 2022-10-06 DIAGNOSIS — M6281 Muscle weakness (generalized): Secondary | ICD-10-CM

## 2022-10-06 DIAGNOSIS — R278 Other lack of coordination: Secondary | ICD-10-CM | POA: Diagnosis not present

## 2022-10-06 NOTE — Therapy (Addendum)
Occupational Therapy Neuro Treatment Note  Patient Name: Blake [name] MRN: [id number] DOB:14-May-1945, 77 y.o., male Today's Date: 10/06/2022  PCP: Dr. Judithann Sheen, MD REFERRING PROVIDER: Dr. Cristopher Peru, MD  END OF SESSION:  OT End of Session - 10/06/22 1635     Visit Number 11    Number of Visits 24    Date for OT Re-Evaluation 11/12/22    OT Start Time 0330    OT Stop Time 0415    OT Time Calculation (min) 45 min    Activity Tolerance Patient tolerated treatment well    Behavior During Therapy [facility] for tasks assessed/performed             Past Medical History:  Diagnosis Date   Arthritis    Back pain    with leg pain   Basal cell carcinoma 09/04/2009   R cheek 5.5 cm ant to earlobe - 12/26/2009 excision   Basal cell carcinoma 09/04/2009   R ant nasal alar rim   Basal cell carcinoma 07/31/2014   R mid brow   Basal cell carcinoma 07/30/2016   R ant nasal alar rim at ant edge of BCC scar   Basal cell carcinoma 11/02/2019   L zygoma    Basal cell carcinoma 07/22/2021   left sideburn area, EDC   Basal cell carcinoma 01/02/2022   Right posterior shoulder, EDC   Basal cell carcinoma 01/02/2022   R posterior shoulder, EDC   Basal cell carcinoma 01/02/2022   Right Supraclavicular Base of Neck, EDC   Cancer of base of tongue (HCC) 2006   s/p chemoradiation   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, bilateral    Dyspnea    Dysrhythmia    Esophageal cancer (HCC) 2021   GERD (gastroesophageal reflux disease)    Hyperlipidemia    Hypertension    Numbness and tingling of both lower extremities    with positioning   Paroxysmal atrial fibrillation (HCC)    Port-A-Cath in place 2006   Prostate enlargement    Squamous cell carcinoma of skin 12/14/2019   L pretibial - ED&C    T2DM (type 2 diabetes mellitus) (HCC)    Past Surgical History:  Procedure Laterality Date   ABDOMINAL SURGERY     CAROTID PTA/STENT INTERVENTION Right 07/04/2021   Procedure: CAROTID  PTA/STENT INTERVENTION;  Surgeon: Annice Needy, MD;  Location: [facility];  Service: Cardiovascular;  Laterality: Right;   COLONOSCOPY  12/08/2003   COLONOSCOPY WITH PROPOFOL N/A 03/21/2019   Procedure: COLONOSCOPY WITH PROPOFOL;  Surgeon: Toledo, Boykin Nearing, MD;  Location: [facility] ENDOSCOPY;  Service: Gastroenterology;  Laterality: N/A;   ESOPHAGOGASTRODUODENOSCOPY (EGD) WITH PROPOFOL N/A 03/21/2019   Procedure: ESOPHAGOGASTRODUODENOSCOPY (EGD) WITH PROPOFOL;  Surgeon: Toledo, Boykin Nearing, MD;  Location: [facility] ENDOSCOPY;  Service: Gastroenterology;  Laterality: N/A;   esophogeal cancer     JOINT REPLACEMENT Right    total hip   LUMBAR LAMINECTOMY/DECOMPRESSION MICRODISCECTOMY N/A 03/03/2018   Procedure: LUMBAR LAMINECTOMY/DECOMPRESSION MICRODISCECTOMY 1 LEVEL-L3-4,L4-5;  Surgeon: Venetia Night, MD;  Location: [facility] ORS;  Service: Neurosurgery;  Laterality: N/A;   LUMBAR LAMINECTOMY/DECOMPRESSION MICRODISCECTOMY N/A 02/24/2020   Procedure: LEFT L3-4 MICRODISCECTOMY, L4-5 DECOMPRESSION;  Surgeon: Venetia Night, MD;  Location: [facility] ORS;  Service: Neurosurgery;  Laterality: N/A;   MAXIMUM ACCESS (MAS) TRANSFORAMINAL LUMBAR INTERBODY FUSION (TLIF) 1 LEVEL N/A 07/13/2020   Procedure: OPEN L3-4 TRANSFORAMINAL LUMBAR INTERBODY FUSION (TLIF);  Surgeon: Venetia Night, MD;  Location: [facility] ORS;  Service: Neurosurgery;  Laterality: N/A;   PARTIAL HIP  ARTHROPLASTY Right 2014   PORT-A-CATH REMOVAL     PORTACATH PLACEMENT Left 01/12/2020   Procedure: INSERTION PORT-A-CATH, with ultrasound fluoroscopy;  Surgeon: Hulda Marin, MD;  Location: [facility] ORS;  Service: General;  Laterality: Left;   TONGUE BIOPSY  2006   TONSILLECTOMY     TRIGGER FINGER RELEASE Right 2013   UPPER GI ENDOSCOPY  01/25/14   multiple gastric polyps   Patient Active Problem List   Diagnosis Date Noted   Hypocalcemia 06/02/2022   IDA (iron deficiency anemia) 05/29/2022   Loss of consciousness (HCC) 04/25/2022    Paresthesia 04/25/2022   Encephalomalacia on imaging study 04/25/2022   Positive D dimer 04/25/2022   Vasovagal syncope 04/25/2022   Acidosis 04/25/2022   Sinus bradycardia 04/25/2022   Anemia 04/25/2022   Paraseptal emphysema (HCC) 02/24/2022   Lung nodules 01/01/2022   Carotid stenosis, right 07/04/2021   Paroxysmal A-fib (HCC) 02/26/2021   Spondylolisthesis 07/13/2020   Chest pain 06/26/2020   Hypertensive urgency 06/26/2020   Shortness of breath 06/26/2020   Pneumonia 01/17/2020   Non-intractable vomiting 12/26/2019   Esophageal carcinoma (HCC) 12/09/2019   History of head and neck cancer 12/09/2019   Chronic pain syndrome 12/08/2019   History of lumbar laminectomy 12/08/2019   Supraventricular tachycardia 12/08/2019   Acute postoperative pain 09/13/2019   Malignant neoplasm of lower third of esophagus (HCC) 07/14/2019   Goals of care, counseling/discussion 05/04/2019   Osteoarthritis 04/26/2019   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   Bilateral hand numbness 12/16/2018   Bilateral hand pain 12/16/2018   Lumbar radiculopathy 06/02/2017   Lumbar degenerative disc disease 06/02/2017   Spinal stenosis, lumbar region, 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   Primary osteoarthritis of right shoulder 07/01/2016   Bilateral carotid artery stenosis 06/05/2016   Carotid stenosis 04/22/2016   Allergic rhinitis 08/24/2015   Diabetes mellitus (HCC) 08/24/2015   Personal history of malignant neoplasm of tongue 08/24/2015   HLD (hyperlipidemia) 08/24/2015   Gastric polyposis 08/24/2015   Stenosing tenosynovitis of finger 07/11/2014   Coronary artery disease 06/26/2014   Family history of cancer of digestive system 10/20/2006   Deficiency, 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 Pt. reports he is feeling some better however he is still very weak. Pt. Reports he has a Dr. appointment soon to talk about him feeling weak.  Pt accompanied by: self  PERTINENT HISTORY:   Pt. 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 Pt.'s PMHx includes: Right carotid artery stenosis with stenting with further stenosis around the current stents, Esophageal CA, Type2 DM, Chronic back pain with  L3-L4, L5 Lumbar Decompression. Pt. Has a heart monitor placed.  PRECAUTIONS: None  WEIGHT BEARING RESRICTIONS: No  PAIN:  Are you having pain? No pain reported today, however reports he has had some mild cramping in his LUE  FALLS: Has patient fallen in last 6 months? No  LIVING ENVIRONMENT: Lives with: lives alone Lives in: House/apartment Stairs: level entry, 2 story Has following equipment at home: Single point cane, Environmental consultant - 2 wheeled, Environmental consultant - 4 wheeled, shower chair, Shower bench, and [employer]  bars  PLOF: Independent  PATIENT GOALS: To get his left hand back  OBJECTIVE:   HAND DOMINANCE: Right  ADLs:  Transfers/ambulation related to ADLs: Eating: Independent, modified technique for cutting food Grooming: Independent UB Dressing:  Independent LB Dressing: Increased time to tie tennis shoes Toileting: Independent Bathing: Independent Tub Shower transfers:  Independent with walkin shower   IADLs: Shopping: Independent Light housekeeping:  Pt. Has a personal housekeeper Meal Prep: Minimal cooking(At baseline), able to do light meal prep. Medication management: Independent Financial management:  No change Handwriting:  Uses his dominant right hand  MOBILITY STATUS: Independent   Activity  tolerance: Intact  FUNCTIONAL OUTCOME MEASURES: FOTO: 71 TR score: 70  UPPER EXTREMITY ROM:    Active ROM Right Eval WFL Left Eval [facility]  Shoulder flexion    Shoulder abduction    Shoulder adduction    Shoulder extension    Shoulder internal rotation    Shoulder external rotation    Elbow flexion    Elbow extension    Wrist flexion    Wrist extension    Wrist ulnar deviation    Wrist radial deviation    Wrist pronation    Wrist supination    (Blank rows = not tested)  UPPER EXTREMITY MMT:     MMT Right eval Left eval Right 09/29/22 Left 09/29/22  Shoulder flexion 4+ 4+ 5 4+  Shoulder abduction 4+ 4+ 4+ 4+  Shoulder adduction      Shoulder extension      Shoulder internal rotation      Shoulder external rotation      Middle trapezius      Lower trapezius      Elbow flexion 5 4+ 5 5  Elbow extension 5 4+ 5 5  Wrist flexion 5 4 5  4+  Wrist extension 5 4 5  4+  Wrist ulnar deviation      Wrist radial deviation      Wrist pronation 5 4 5    Wrist supination 5 4 5    (Blank rows = not tested)  HAND FUNCTION: Grip strength: Right: 59 lbs; Left: 46 lbs, Lateral pinch: Right: 19 lbs, Left: 20 lbs, and 3 point pinch: Right: 15 lbs, Left: 14 lbs Grip strength: Right: 59 lbs; Left: 45 lbs, Lateral pinch: Right: 19 lbs, Left: 17 lbs, and 3 point pinch: Right: 17 lbs, Left: 13 lbs  COORDINATION: 9 Hole Peg test: Right: 31 sec; Left: 45 sec 9 Hole Peg test: Right: 31 sec; Left: 55 sec  SENSATION: Positive numbness, and tingling Light touch: WFL Proprioception: WFL   EDEMA: N/A  MUSCLE TONE: Intact  COGNITION: Overall cognitive status: Within functional limits for tasks assessed  VISION: Subjective report:  Reports  increased  blurriness with distance, most notably  with driving  Pt. Reports having an eye exam scheduled VISION ASSESSMENT: To be further assessed in functional context  PERCEPTION: WFL  PRAXIS: Impaired: Motor planning   TODAY'S TREATMENT:  DATE: 10/06/2022 Neuromuscular Reeducation:  Pt. Worked on using L tip pinch to pick up 1/2 inch circular pegs followed by translatory movements moving the pegs from palm to 2nd digit before placing them into a peg board. Pt. removed the 1/2 inch circular pegs using tip pinch and thumb opposition alternating between the 2nd-5th digit.   Therapeutic Exercise:  Pt. performed left gross gripping with a gross grip strengthener. Pt. Worked on sustaining grip while grasping pegs. The gripper was set to 23.4# of grip strength and Pt. Performed 2 trials. Pt. Performed 2# dumbbell exercises including elbow extension/flexion, wrist flexion/extension, forearm pronation/supination, and radial/ulnar deviation. Pt. Performed 10 reps of each exercise for 2 sets.  PATIENT EDUCATION: Education details:  HEP progression (dumbbells for L elbow, forearm, wrist strengthening) Person educated: Patient Education method: Explanation, Demonstration, and Verbal cues, written handout Education comprehension: verbalized understanding, returned demonstration, and needs further education  HOME EXERCISE PROGRAM: Strengthening for L elbow, forearm, wrist using dumbbell, theraputty exercises for L hand strengthening  GOALS: Goals reviewed with patient? Yes  SHORT TERM GOALS: Target date: 11/10/22               1. Pt. will increase FOTO score by 2 points to reflect Pt. perceived improvement with assessment specific ADL/IADL's.   Baseline: Eval: FOTO score: 71 with TR score: 70     09/29/22: FOTO score: 63 with TR score: 70 Goal status: Ongoing  2.  Pt. Will be independent with HEPs of the LUE. Baseline: Eval: No current HEP 09/29/22: Completing theraputty exercises at home but not dumbbell exercises Goal status: Ongoing  LONG TERM GOALS: Target date: 11/12/2022    Pt. Will increase LUE strength by 2 mm  grades to assist with ADLs, and IADLs Baseline: Eval: Left shoulder flexion: 4+, abduction: 4+, elbow flexion: 4+, extension:4+, forearm supination:4 , wrist extension: 4 09/29/22: Left shoulder flexion: 4+, abduction: 4+, elbow flexion: 5, extension:5 , wrist extension: 4+ Goal status: Ongoing  2.  Pt. Will increase left grip strength by 5# to be able to securely hold objects, and open containers, or jars. Baseline: Eval: Right: 59 Left: 46  09/29/22: Right: 59 lbs; Left: 45 lbs Goal status: Ongoing  3.  Pt. Will improve left pinch strength to be able to be able to push his belt through his pant loop independently Baseline: Eval: Pinch strength: lateral: right: 19# left: 20#, 3pt.: Right: 15#, Left:14#  09/29/22: Lateral pinch: Right: 19 lbs, Left: 17 lbs, and 3 point pinch: Right: 17 lbs, Left: 13 lbs Goal status: Ongoing  4.  Pt. Will improve left hand [facility] skills by 3 sec. Of speed to be able to button his shirt efficiently Baseline: Eval:  9 hole peg test: Right:  31 sec., Left: 45 sec. 09/29/22: 9 Hole Peg test: Right: 31 sec; Left: 55 sec Goal status: Ongoing  5.  Pt. Will Pt. Will consistently, and efficiently reach for targets with the left UE with 100% accuracy. Baseline: Eval: Pt. presents with decreased motor control affecting the accuracy of reaching for objects with the left hand when challenged with speed. Goal status: Ongoing     ASSESSMENT:  CLINICAL IMPRESSION Pt. Reported that his L hand is feeling weak today and that for the last week he has been sick. Pt. Reports he is feeling some better but has an appointment with his family doctor soon to ask about the weakness he has been experiencing. Pt. performed gross gripping with a gross gripping strengthener at 23.4# resistance for  both sets. Pt. dropped 3 pegs when performing both sets due L hand fatigue and frequent rest breaks were required. Pt. Was able to complete 2# dumbbell exercises however Pt. required frequent rest breaks  throughout to ensure proper form. Pt. Was able to use tip pinch to place circular 1/2 inch pegs onto peg board however required increased time. Pt. Dropped several 1/2 inch pegs due to impaired of sensation in the 2-5th digit in his L hand.  Pt.continues to benefit from skilled OT services to work on improving left hand function in order to increase engagement in, and maximize independence with ADLs, and IADL tasks.   PERFORMANCE DEFICITS: in functional skills including ADLs, IADLs, coordination, dexterity, proprioception, ROM, strength, and pain, cognitive skills including , and psychosocial skills including coping strategies and environmental adaptation.   IMPAIRMENTS: are limiting patient from ADLs, IADLs, and leisure.   CO-MORBIDITIES: may have co-morbidities  that affects occupational performance. Patient will benefit from skilled OT to address above impairments and improve overall function.  MODIFICATION OR ASSISTANCE TO COMPLETE EVALUATION: Min-Moderate modification of tasks or assist with assess necessary to complete an evaluation.  OT OCCUPATIONAL PROFILE AND HISTORY: Detailed assessment: Review of records and additional review of physical, cognitive, psychosocial history related to current functional performance.  CLINICAL DECISION MAKING: Moderate - several treatment options, min-mod task modification necessary  REHAB POTENTIAL: Good  EVALUATION COMPLEXITY: Moderate    PLAN:  OT FREQUENCY: 2x/week  OT DURATION: 12 weeks  PLANNED INTERVENTIONS: self care/ADL training, therapeutic exercise, therapeutic activity, neuromuscular re-education, manual therapy, and functional mobility training  RECOMMENDED OTHER SERVICES: N/A  CONSULTED AND AGREED WITH PLAN OF CARE: Patient  PLAN FOR NEXT SESSION: see above  Herma Carson OTS 4:50 PM 10/06/22  This entire session was performed under the direct supervision and direction of a licensed therapist. I have personally read, edited, and  approve of the note as written.  Olegario Messier, MS, OTR/L   10/06/2022

## 2022-10-08 ENCOUNTER — Ambulatory Visit: Payer: Medicare HMO | Admitting: Occupational Therapy

## 2022-10-09 ENCOUNTER — Other Ambulatory Visit: Payer: Self-pay

## 2022-10-09 MED ORDER — OMEPRAZOLE 40 MG PO CPDR
DELAYED_RELEASE_CAPSULE | ORAL | 1 refills | Status: DC
  Filled 2022-10-09: qty 90, 90d supply, fill #0
  Filled 2023-01-09: qty 90, 90d supply, fill #1

## 2022-10-13 ENCOUNTER — Other Ambulatory Visit: Payer: Self-pay

## 2022-10-13 DIAGNOSIS — R0602 Shortness of breath: Secondary | ICD-10-CM | POA: Diagnosis not present

## 2022-10-13 DIAGNOSIS — Z79899 Other long term (current) drug therapy: Secondary | ICD-10-CM | POA: Diagnosis not present

## 2022-10-13 DIAGNOSIS — I10 Essential (primary) hypertension: Secondary | ICD-10-CM | POA: Diagnosis not present

## 2022-10-13 DIAGNOSIS — R5383 Other fatigue: Secondary | ICD-10-CM | POA: Diagnosis not present

## 2022-10-13 DIAGNOSIS — H2511 Age-related nuclear cataract, right eye: Secondary | ICD-10-CM | POA: Diagnosis not present

## 2022-10-13 DIAGNOSIS — R5381 Other malaise: Secondary | ICD-10-CM | POA: Diagnosis not present

## 2022-10-13 DIAGNOSIS — R1010 Upper abdominal pain, unspecified: Secondary | ICD-10-CM | POA: Diagnosis not present

## 2022-10-13 DIAGNOSIS — H2513 Age-related nuclear cataract, bilateral: Secondary | ICD-10-CM | POA: Diagnosis not present

## 2022-10-13 DIAGNOSIS — H25043 Posterior subcapsular polar age-related cataract, bilateral: Secondary | ICD-10-CM | POA: Diagnosis not present

## 2022-10-13 DIAGNOSIS — R918 Other nonspecific abnormal finding of lung field: Secondary | ICD-10-CM | POA: Diagnosis not present

## 2022-10-13 DIAGNOSIS — H25013 Cortical age-related cataract, bilateral: Secondary | ICD-10-CM | POA: Diagnosis not present

## 2022-10-13 MED ORDER — PREDNISONE 10 MG PO TABS
ORAL_TABLET | ORAL | 0 refills | Status: AC
  Filled 2022-10-13: qty 21, 6d supply, fill #0

## 2022-10-14 ENCOUNTER — Ambulatory Visit: Payer: Medicare HMO | Admitting: Occupational Therapy

## 2022-10-14 ENCOUNTER — Other Ambulatory Visit: Payer: Self-pay | Admitting: Internal Medicine

## 2022-10-14 ENCOUNTER — Other Ambulatory Visit: Payer: Self-pay

## 2022-10-14 DIAGNOSIS — R278 Other lack of coordination: Secondary | ICD-10-CM

## 2022-10-14 DIAGNOSIS — R1010 Upper abdominal pain, unspecified: Secondary | ICD-10-CM

## 2022-10-14 DIAGNOSIS — M6281 Muscle weakness (generalized): Secondary | ICD-10-CM

## 2022-10-14 MED ORDER — LISINOPRIL 10 MG PO TABS
10.0000 mg | ORAL_TABLET | Freq: Every day | ORAL | 3 refills | Status: DC
  Filled 2022-10-14: qty 30, 30d supply, fill #0
  Filled 2022-11-24: qty 30, 30d supply, fill #1
  Filled 2022-12-25: qty 30, 30d supply, fill #2
  Filled 2023-01-30: qty 30, 30d supply, fill #3

## 2022-10-14 NOTE — Therapy (Cosign Needed)
Occupational Therapy Neuro Treatment Note  Patient Name: Blake [name] FILES MRN: [id number] DOB:05-13-1945, 77 y.o., male Today's Date: 10/14/2022  PCP: Dr. Judithann Sheen, MD REFERRING PROVIDER: Dr. Cristopher Peru, MD  END OF SESSION:  OT End of Session - 10/14/22 1701     Visit Number 12    Number of Visits 24    Date for OT Re-Evaluation 11/12/22    OT Start Time 1615    OT Stop Time 1700    OT Time Calculation (min) 45 min    Activity Tolerance Patient tolerated treatment well    Behavior During Therapy [facility] for tasks assessed/performed             Past Medical History:  Diagnosis Date   Arthritis    Back pain    with leg pain   Basal cell carcinoma 09/04/2009   R cheek 5.5 cm ant to earlobe - 12/26/2009 excision   Basal cell carcinoma 09/04/2009   R ant nasal alar rim   Basal cell carcinoma 07/31/2014   R mid brow   Basal cell carcinoma 07/30/2016   R ant nasal alar rim at ant edge of BCC scar   Basal cell carcinoma 11/02/2019   L zygoma    Basal cell carcinoma 07/22/2021   left sideburn area, EDC   Basal cell carcinoma 01/02/2022   Right posterior shoulder, EDC   Basal cell carcinoma 01/02/2022   R posterior shoulder, EDC   Basal cell carcinoma 01/02/2022   Right Supraclavicular Base of Neck, EDC   Cancer of base of tongue (HCC) 2006   s/p chemoradiation   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, bilateral    Dyspnea    Dysrhythmia    Esophageal cancer (HCC) 2021   GERD (gastroesophageal reflux disease)    Hyperlipidemia    Hypertension    Numbness and tingling of both lower extremities    with positioning   Paroxysmal atrial fibrillation (HCC)    Port-A-Cath in place 2006   Prostate enlargement    Squamous cell carcinoma of skin 12/14/2019   L pretibial - ED&C    T2DM (type 2 diabetes mellitus) (HCC)    Past Surgical History:  Procedure Laterality Date   ABDOMINAL SURGERY     CAROTID PTA/STENT INTERVENTION Right 07/04/2021   Procedure: CAROTID  PTA/STENT INTERVENTION;  Surgeon: Annice Needy, MD;  Location: [facility];  Service: Cardiovascular;  Laterality: Right;   COLONOSCOPY  12/08/2003   COLONOSCOPY WITH PROPOFOL N/A 03/21/2019   Procedure: COLONOSCOPY WITH PROPOFOL;  Surgeon: Toledo, Boykin Nearing, MD;  Location: [facility]OS[facility];  Service: Gastroenterology;  Laterality: N/A;   ESOPHAGOGASTRODUODENOSCOPY (EGD) WITH PROPOFOL N/A 03/21/2019   Procedure: ESOPHAGOGASTRODUODENOSCOPY (EGD) WITH PROPOFOL;  Surgeon: Toledo, Boykin Nearing, MD;  Location: [facility] ENDOSCOPY;  Service: Gastroenterology;  Laterality: N/A;   esophogeal cancer     JOINT REPLACEMENT Right    total hip   LUMBAR LAMINECTOMY/DECOMPRESSION MICRODISCECTOMY N/A 03/03/2018   Procedure: LUMBAR LAMINECTOMY/DECOMPRESSION MICRODISCECTOMY 1 LEVEL-L3-4,L4-5;  Surgeon: Venetia Night, MD;  Location: [facility] ORS;  Service: Neurosurgery;  Laterality: N/A;   LUMBAR LAMINECTOMY/DECOMPRESSION MICRODISCECTOMY N/A 02/24/2020   Procedure: LEFT L3-4 MICRODISCECTOMY, L4-5 DECOMPRESSION;  Surgeon: Venetia Night, MD;  Location: [facility] ORS;  Service: Neurosurgery;  Laterality: N/A;   MAXIMUM ACCESS (MAS) TRANSFORAMINAL LUMBAR INTERBODY FUSION (TLIF) 1 LEVEL N/A 07/13/2020   Procedure: OPEN L3-4 TRANSFORAMINAL LUMBAR INTERBODY FUSION (TLIF);  Surgeon: Venetia Night, MD;  Location: [facility] ORS;  Service: Neurosurgery;  Laterality: N/A;   PARTIAL HIP  ARTHROPLASTY Right 2014   PORT-A-CATH REMOVAL     PORTACATH PLACEMENT Left 01/12/2020   Procedure: INSERTION PORT-A-CATH, with ultrasound fluoroscopy;  Surgeon: Hulda Marin, MD;  Location: [facility] ORS;  Service: General;  Laterality: Left;   TONGUE BIOPSY  2006   TONSILLECTOMY     TRIGGER FINGER RELEASE Right 2013   UPPER GI ENDOSCOPY  01/25/14   multiple gastric polyps   Patient Active Problem List   Diagnosis Date Noted   Hypocalcemia 06/02/2022   IDA (iron deficiency anemia) 05/29/2022   Loss of consciousness (HCC) 04/25/2022    Paresthesia 04/25/2022   Encephalomalacia on imaging study 04/25/2022   Positive D dimer 04/25/2022   Vasovagal syncope 04/25/2022   Acidosis 04/25/2022   Sinus bradycardia 04/25/2022   Anemia 04/25/2022   Paraseptal emphysema (HCC) 02/24/2022   Lung nodules 01/01/2022   Carotid stenosis, right 07/04/2021   Paroxysmal A-fib (HCC) 02/26/2021   Spondylolisthesis 07/13/2020   Chest pain 06/26/2020   Hypertensive urgency 06/26/2020   Shortness of breath 06/26/2020   Pneumonia 01/17/2020   Non-intractable vomiting 12/26/2019   Esophageal carcinoma (HCC) 12/09/2019   History of head and neck cancer 12/09/2019   Chronic pain syndrome 12/08/2019   History of lumbar laminectomy 12/08/2019   Supraventricular tachycardia 12/08/2019   Acute postoperative pain 09/13/2019   Malignant neoplasm of lower third of esophagus (HCC) 07/14/2019   Goals of care, counseling/discussion 05/04/2019   Osteoarthritis 04/26/2019   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   Bilateral hand numbness 12/16/2018   Bilateral hand pain 12/16/2018   Lumbar radiculopathy 06/02/2017   Lumbar degenerative disc disease 06/02/2017   Spinal stenosis, lumbar region, 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   Primary osteoarthritis of right shoulder 07/01/2016   Bilateral carotid artery stenosis 06/05/2016   Carotid stenosis 04/22/2016   Allergic rhinitis 08/24/2015   Diabetes mellitus (HCC) 08/24/2015   Personal history of malignant neoplasm of tongue 08/24/2015   HLD (hyperlipidemia) 08/24/2015   Gastric polyposis 08/24/2015   Stenosing tenosynovitis of finger 07/11/2014   Coronary artery disease 06/26/2014   Family history of cancer of digestive system 10/20/2006   Deficiency, 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 Pt. Reports that he went to his family doctor and was told he had food poisoning.  Pt accompanied by: self  PERTINENT HISTORY:   Pt. 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 Pt.'s PMHx includes: Right carotid artery stenosis with stenting with further stenosis around the current stents, Esophageal CA, Type2 DM, Chronic back pain with  L3-L4, L5 Lumbar Decompression. Pt. Has a heart monitor placed.  PRECAUTIONS: None  WEIGHT BEARING RESRICTIONS: No  PAIN:  Are you having pain? No pain reported today.  FALLS: Has patient fallen in last 6 months? No  LIVING ENVIRONMENT: Lives with: lives alone Lives in: House/apartment Stairs: level entry, 2 story Has following equipment at home: Single point cane, Environmental consultant - 2 wheeled, Environmental consultant - 4 wheeled, shower chair, Shower bench, and Grab bars  PLOF: Independent  PATIENT GOALS: To get his left hand back  OBJECTIVE:   HAND DOMINANCE:  Right  ADLs:  Transfers/ambulation related to ADLs: Eating: Independent, modified technique for cutting food Grooming: Independent UB Dressing:  Independent LB Dressing: Increased time to tie tennis shoes Toileting: Independent Bathing: Independent Tub Shower transfers:  Independent with walkin shower   IADLs: Shopping: Independent Light housekeeping:  Pt. Has a personal housekeeper Meal Prep: Minimal cooking(At baseline), able to do light meal prep. Medication management: Independent Financial management:  No change Handwriting:  Uses his dominant right hand  MOBILITY STATUS: Independent   Activity tolerance: Intact  FUNCTIONAL OUTCOME MEASURES: FOTO: 71 TR score: 70  UPPER  EXTREMITY ROM:    Active ROM Right Eval WFL Left Eval [facility]  Shoulder flexion    Shoulder abduction    Shoulder adduction    Shoulder extension    Shoulder internal rotation    Shoulder external rotation    Elbow flexion    Elbow extension    Wrist flexion    Wrist extension    Wrist ulnar deviation    Wrist radial deviation    Wrist pronation    Wrist supination    (Blank rows = not tested)  UPPER EXTREMITY MMT:     MMT Right eval Left eval Right 09/29/22 Left 09/29/22  Shoulder flexion 4+ 4+ 5 4+  Shoulder abduction 4+ 4+ 4+ 4+  Shoulder adduction      Shoulder extension      Shoulder internal rotation      Shoulder external rotation      Middle trapezius      Lower trapezius      Elbow flexion 5 4+ 5 5  Elbow extension 5 4+ 5 5  Wrist flexion 5 4 5  4+  Wrist extension 5 4 5  4+  Wrist ulnar deviation      Wrist radial deviation      Wrist pronation 5 4 5    Wrist supination 5 4 5    (Blank rows = not tested)  HAND FUNCTION: Grip strength: Right: 59 lbs; Left: 46 lbs, Lateral pinch: Right: 19 lbs, Left: 20 lbs, and 3 point pinch: Right: 15 lbs, Left: 14 lbs Grip strength: Right: 59 lbs; Left: 45 lbs, Lateral pinch: Right: 19 lbs, Left: 17 lbs, and 3 point pinch: Right: 17 lbs, Left: 13 lbs  COORDINATION: 9 Hole Peg test: Right: 31 sec; Left: 45 sec 9 Hole Peg test: Right: 31 sec; Left: 55 sec  SENSATION: Positive numbness, and tingling Light touch: WFL Proprioception: WFL   EDEMA: N/A  MUSCLE TONE: Intact  COGNITION: Overall cognitive status: Within functional limits for tasks assessed  VISION: Subjective report:  Reports  increased  blurriness with distance, most notably  with driving  Pt. Reports having an eye exam scheduled VISION ASSESSMENT: To be further assessed in functional context  PERCEPTION: WFL  PRAXIS: Impaired: Motor planning   TODAY'S TREATMENT:  DATE: 10/14/2022 Neuromuscular Reeducation:  Pt. Worked on using L tip pinch and thumb opposition alternating between the 2nd-5th digit to place them into the peg board. Pt. removed the 1/2 inch circular pegs using tip pinch and thumb opposition alternating between the 2nd-5th digit.   Therapeutic Exercise:  Pt. performed left gross gripping with a gross grip strengthener. Pt. Worked on sustaining grip while grasping pegs. The gripper was set to 23.4# of grip strength and Pt. Performed 2 trials. Pt. Performed 3# dumbbell exercises including elbow extension/flexion, wrist flexion/extension, forearm pronation/supination, and radial/ulnar deviation. Pt. Performed 10 reps of each exercise for 2 sets. Pt. Worked on using lateral pinch to place yellow, red, green, blue, and black onto horizontal dowel x 2 trials. Pt. Removed resistive clips using lateral pinch x2 trials. PATIENT EDUCATION: Education details:  HEP progression (dumbbells for L elbow, forearm, wrist strengthening) Person educated: Patient Education method: Explanation, Demonstration, and Verbal cues, written handout Education comprehension: verbalized understanding, returned demonstration, and needs further education  HOME EXERCISE PROGRAM: Strengthening for L elbow, forearm, wrist using dumbbell, theraputty exercises for L hand strengthening  GOALS: Goals reviewed with patient? Yes  SHORT TERM GOALS: Target date: 11/10/22               1. Pt. will increase FOTO score by 2 points to reflect Pt. perceived improvement with assessment specific ADL/IADL's.   Baseline: Eval: FOTO score: 71 with TR score: 70     09/29/22: FOTO score: 63 with TR score: 70 Goal status: Ongoing  2.  Pt. Will be independent with HEPs of the LUE. Baseline: Eval: No current HEP 09/29/22: Completing theraputty exercises at home but not dumbbell exercises Goal status: Ongoing  LONG TERM GOALS: Target date: 11/12/2022    Pt.  Will increase LUE strength by 2 mm grades to assist with ADLs, and IADLs Baseline: Eval: Left shoulder flexion: 4+, abduction: 4+, elbow flexion: 4+, extension:4+, forearm supination:4 , wrist extension: 4 09/29/22: Left shoulder flexion: 4+, abduction: 4+, elbow flexion: 5, extension:5 , wrist extension: 4+ Goal status: Ongoing  2.  Pt. Will increase left grip strength by 5# to be able to securely hold objects, and open containers, or jars. Baseline: Eval: Right: 59 Left: 46  09/29/22: Right: 59 lbs; Left: 45 lbs Goal status: Ongoing  3.  Pt. Will improve left pinch strength to be able to be able to push his belt through his pant loop independently Baseline: Eval: Pinch strength: lateral: right: 19# left: 20#, 3pt.: Right: 15#, Left:14#  09/29/22: Lateral pinch: Right: 19 lbs, Left: 17 lbs, and 3 point pinch: Right: 17 lbs, Left: 13 lbs Goal status: Ongoing  4.  Pt. Will improve left hand [facility] skills by 3 sec. Of speed to be able to button his shirt efficiently Baseline: Eval:  9 hole peg test: Right:  31 sec., Left: 45 sec. 09/29/22: 9 Hole Peg test: Right: 31 sec; Left: 55 sec Goal status: Ongoing  5.  Pt. Will Pt. Will consistently, and efficiently reach for targets with the left UE with 100% accuracy. Baseline: Eval: Pt. presents with decreased motor control affecting the accuracy of reaching for objects with the left hand when challenged with speed. Goal status: Ongoing     ASSESSMENT:  CLINICAL IMPRESSION Pt reports that he has not been doing his exercises at home because he has not felt good enough but now that he is feeling better he will continue doing them. Pt. Reports that he is feeling much better and that  he has had food poisoning but feels like he is on the road to recovery.  Pt. performed gross gripping with a gross gripping strengthener at 23.4# resistance for both sets and required less rest breaks compared to last therapy session. Pt. dropped 2 pegs when performing the 2nd set  due L hand fatigue. Pt. Was able to complete 3# dumbbell exercises and required less rest breaks throughout however still required increased verbal cues to ensure proper form. Pt. Was able to use tip pinch and thumb opposition to place circular 1/2 inch pegs onto peg board however required increased time. Pt. Dropped several 1/2 inch pegs due to impaired of sensation in the 2-5th digit in his L hand.  Pt.continues to benefit from skilled OT services to work on improving left hand function in order to increase engagement in, and maximize independence with ADLs, and IADL tasks.   PERFORMANCE DEFICITS: in functional skills including ADLs, IADLs, coordination, dexterity, proprioception, ROM, strength, and pain, cognitive skills including , and psychosocial skills including coping strategies and environmental adaptation.   IMPAIRMENTS: are limiting patient from ADLs, IADLs, and leisure.   CO-MORBIDITIES: may have co-morbidities  that affects occupational performance. Patient will benefit from skilled OT to address above impairments and improve overall function.  MODIFICATION OR ASSISTANCE TO COMPLETE EVALUATION: Min-Moderate modification of tasks or assist with assess necessary to complete an evaluation.  OT OCCUPATIONAL PROFILE AND HISTORY: Detailed assessment: Review of records and additional review of physical, cognitive, psychosocial history related to current functional performance.  CLINICAL DECISION MAKING: Moderate - several treatment options, min-mod task modification necessary  REHAB POTENTIAL: Good  EVALUATION COMPLEXITY: Moderate    PLAN:  OT FREQUENCY: 2x/week  OT DURATION: 12 weeks  PLANNED INTERVENTIONS: self care/ADL training, therapeutic exercise, therapeutic activity, neuromuscular re-education, manual therapy, and functional mobility training  RECOMMENDED OTHER SERVICES: N/A  CONSULTED AND AGREED WITH PLAN OF CARE: Patient  PLAN FOR NEXT SESSION: see above  Herma Carson OTS 5:03 PM, 10/14/2022  This entire session was performed under the direct supervision and direction of a licensed therapist. I have personally read, edited, and approve of the note as written.  Olegario Messier, MS, OTR/L   10/15/2022

## 2022-10-16 ENCOUNTER — Ambulatory Visit: Payer: Medicare HMO | Admitting: Occupational Therapy

## 2022-10-16 ENCOUNTER — Other Ambulatory Visit: Payer: Self-pay

## 2022-10-16 ENCOUNTER — Ambulatory Visit
Admission: RE | Admit: 2022-10-16 | Discharge: 2022-10-16 | Disposition: A | Discharge status: Home / Self Care | Payer: Medicare HMO | Source: Ambulatory Visit | Attending: Internal Medicine | Admitting: Internal Medicine

## 2022-10-16 DIAGNOSIS — M6281 Muscle weakness (generalized): Secondary | ICD-10-CM

## 2022-10-16 DIAGNOSIS — Z8501 Personal history of malignant neoplasm of esophagus: Secondary | ICD-10-CM | POA: Diagnosis not present

## 2022-10-16 DIAGNOSIS — R109 Unspecified abdominal pain: Secondary | ICD-10-CM | POA: Diagnosis not present

## 2022-10-16 DIAGNOSIS — R278 Other lack of coordination: Secondary | ICD-10-CM

## 2022-10-16 DIAGNOSIS — R1010 Upper abdominal pain, unspecified: Secondary | ICD-10-CM

## 2022-10-16 MED ORDER — LEVOFLOXACIN 500 MG PO TABS
500.0000 mg | ORAL_TABLET | Freq: Every day | ORAL | 0 refills | Status: DC
  Filled 2022-10-16: qty 10, 10d supply, fill #0

## 2022-10-16 NOTE — Therapy (Addendum)
Occupational Therapy Neuro Treatment Note  Patient Name: Blake [name] MRN: [id number] DOB:04/22/1945, 77 y.o., male Today's Date: 10/16/2022  PCP: Dr. Judithann Sheen, MD REFERRING PROVIDER: Dr. Cristopher Peru, MD  END OF SESSION:  OT End of Session - 10/16/22 1713     Visit Number 13    Number of Visits 24    Date for OT Re-Evaluation 11/12/22    OT Start Time 1615    OT Stop Time 1700    OT Time Calculation (min) 45 min    Activity Tolerance Patient tolerated treatment well    Behavior During Therapy [facility] for tasks assessed/performed             Past Medical History:  Diagnosis Date   Arthritis    Back pain    with leg pain   Basal cell carcinoma 09/04/2009   R cheek 5.5 cm ant to earlobe - 12/26/2009 excision   Basal cell carcinoma 09/04/2009   R ant nasal alar rim   Basal cell carcinoma 07/31/2014   R mid brow   Basal cell carcinoma 07/30/2016   R ant nasal alar rim at ant edge of BCC scar   Basal cell carcinoma 11/02/2019   L zygoma    Basal cell carcinoma 07/22/2021   left sideburn area, EDC   Basal cell carcinoma 01/02/2022   Right posterior shoulder, EDC   Basal cell carcinoma 01/02/2022   R posterior shoulder, EDC   Basal cell carcinoma 01/02/2022   Right Supraclavicular Base of Neck, EDC   Cancer of base of tongue (HCC) 2006   s/p chemoradiation   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, bilateral    Dyspnea    Dysrhythmia    Esophageal cancer (HCC) 2021   GERD (gastroesophageal reflux disease)    Hyperlipidemia    Hypertension    Numbness and tingling of both lower extremities    with positioning   Paroxysmal atrial fibrillation (HCC)    Port-A-Cath in place 2006   Prostate enlargement    Squamous cell carcinoma of skin 12/14/2019   L pretibial - ED&C    T2DM (type 2 diabetes mellitus) (HCC)    Past Surgical History:  Procedure Laterality Date   ABDOMINAL SURGERY     CAROTID PTA/STENT INTERVENTION Right 07/04/2021   Procedure: CAROTID  PTA/STENT INTERVENTION;  Surgeon: Annice Needy, MD;  Location: [facility];  Service: Cardiovascular;  Laterality: Right;   COLONOSCOPY  12/08/2003   COLONOSCOPY WITH PROPOFOL N/A 03/21/2019   Procedure: COLONOSCOPY WITH PROPOFOL;  Surgeon: Toledo, Boykin Nearing, MD;  Location: [facility];  Service: Gastroenterology;  Laterality: N/A;   ESOPHAGOGASTRODUODENOSCOPY (EGD) WITH PROPOFOL N/A 03/21/2019   Procedure: ESOPHAGOGASTRODUODENOSCOPY (EGD) WITH PROPOFOL;  Surgeon: Toledo, Boykin Nearing, MD;  Location: [facility]OS[facility];  Service: Gastroenterology;  Laterality: N/A;   esophogeal cancer     JOINT REPLACEMENT Right    total hip   LUMBAR LAMINECTOMY/DECOMPRESSION MICRODISCECTOMY N/A 03/03/2018   Procedure: LUMBAR LAMINECTOMY/DECOMPRESSION MICRODISCECTOMY 1 LEVEL-L3-4,L4-5;  Surgeon: Venetia Night, MD;  Location: [facility] OR[facility];  Service: Neurosurgery;  Laterality: N/A;   LUMBAR LAMINECTOMY/DECOMPRESSION MICRODISCECTOMY N/A 02/24/2020   Procedure: LEFT L3-4 MICRODISCECTOMY, L4-5 DECOMPRESSION;  Surgeon: Venetia Night, MD;  Location: [facility];  Service: Neurosurgery;  Laterality: N/A;   MAXIMUM ACCESS (MAS) TRANSFORAMINAL LUMBAR INTERBODY FUSION (TLIF) 1 LEVEL N/A 07/13/2020   Procedure: OPEN L3-4 TRANSFORAMINAL LUMBAR INTERBODY FUSION (TLIF);  Surgeon: Venetia Night, MD;  Location: [facility];  Service: Neurosurgery;  Laterality: N/A;   PARTIAL HIP  ARTHROPLASTY Right 2014   PORT-A-CATH REMOVAL     PORTACATH PLACEMENT Left 01/12/2020   Procedure: INSERTION PORT-A-CATH, with ultrasound fluoroscopy;  Surgeon: Hulda Marin, MD;  Location: [facility] OR[facility];  Service: General;  Laterality: Left;   TONGUE BIOPSY  2006   TONSILLECTOMY     TRIGGER FINGER RELEASE Right 2013   UPPER GI ENDOSCOPY  01/25/14   multiple gastric polyps   Patient Active Problem List   Diagnosis Date Noted   Hypocalcemia 06/02/2022   IDA (iron deficiency anemia) 05/29/2022   Loss of consciousness (HCC) 04/25/2022    Paresthesia 04/25/2022   Encephalomalacia on imaging study 04/25/2022   Positive D dimer 04/25/2022   Vasovagal syncope 04/25/2022   Acidosis 04/25/2022   Sinus bradycardia 04/25/2022   Anemia 04/25/2022   Paraseptal emphysema (HCC) 02/24/2022   Lung nodules 01/01/2022   Carotid stenosis, right 07/04/2021   Paroxysmal A-fib (HCC) 02/26/2021   Spondylolisthesis 07/13/2020   Chest pain 06/26/2020   Hypertensive urgency 06/26/2020   Shortness of breath 06/26/2020   Pneumonia 01/17/2020   Non-intractable vomiting 12/26/2019   Esophageal carcinoma (HCC) 12/09/2019   History of head and neck cancer 12/09/2019   Chronic pain syndrome 12/08/2019   History of lumbar laminectomy 12/08/2019   Supraventricular tachycardia 12/08/2019   Acute postoperative pain 09/13/2019   Malignant neoplasm of lower third of esophagus (HCC) 07/14/2019   Goals of care, counseling/discussion 05/04/2019   Osteoarthritis 04/26/2019   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   Bilateral hand numbness 12/16/2018   Bilateral hand pain 12/16/2018   Lumbar radiculopathy 06/02/2017   Lumbar degenerative disc disease 06/02/2017   Spinal stenosis, lumbar region, 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   Primary osteoarthritis of right shoulder 07/01/2016   Bilateral carotid artery stenosis 06/05/2016   Carotid stenosis 04/22/2016   Allergic rhinitis 08/24/2015   Diabetes mellitus (HCC) 08/24/2015   Personal history of malignant neoplasm of tongue 08/24/2015   HLD (hyperlipidemia) 08/24/2015   Gastric polyposis 08/24/2015   Stenosing tenosynovitis of finger 07/11/2014   Coronary artery disease 06/26/2014   Family history of cancer of digestive system 10/20/2006   Deficiency, 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 Pt. Reports that continues to feel better.  Pt accompanied by: self  PERTINENT HISTORY:   Pt. 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 Pt.'s PMHx includes: Right carotid artery stenosis with stenting with further stenosis around the current stents, Esophageal CA, Type2 DM, Chronic back pain with  L3-L4, L5 Lumbar Decompression. Pt. Has a heart monitor placed.  PRECAUTIONS: None  WEIGHT BEARING RESRICTIONS: No  PAIN:  Are you having pain? 3/10 pain in chest region  FALLS: Has patient fallen in last 6 months? No  LIVING ENVIRONMENT: Lives with: lives alone Lives in: House/apartment Stairs: level entry, 2 story Has following equipment at home: Single point cane, Environmental consultant - 2 wheeled, Environmental consultant - 4 wheeled, shower chair, Shower bench, and Grab bars  PLOF: Independent  PATIENT GOALS: To get his left hand back  OBJECTIVE:  HAND DOMINANCE: Right  ADLs:  Transfers/ambulation related to ADLs: Eating: Independent, modified technique for cutting food Grooming: Independent UB Dressing:  Independent LB Dressing: Increased time to tie tennis shoes Toileting: Independent Bathing: Independent Tub Shower transfers:  Independent with walkin shower   IADLs: Shopping: Independent Light housekeeping:  Pt. Has a personal housekeeper Meal Prep: Minimal cooking(At baseline), able to do light meal prep. Medication management: Independent Financial management:  No change Handwriting:  Uses his dominant right hand  MOBILITY STATUS: Independent   Activity tolerance: Intact  FUNCTIONAL OUTCOME MEASURES: FOTO: 71 TR score: 70  UPPER EXTREMITY  ROM:    Active ROM Right Eval WFL Left Eval [facility]  Shoulder flexion    Shoulder abduction    Shoulder adduction    Shoulder extension    Shoulder internal rotation    Shoulder external rotation    Elbow flexion    Elbow extension    Wrist flexion    Wrist extension    Wrist ulnar deviation    Wrist radial deviation    Wrist pronation    Wrist supination    (Blank rows = not tested)  UPPER EXTREMITY MMT:     MMT Right eval Left eval Right 09/29/22 Left 09/29/22  Shoulder flexion 4+ 4+ 5 4+  Shoulder abduction 4+ 4+ 4+ 4+  Shoulder adduction      Shoulder extension      Shoulder internal rotation      Shoulder external rotation      Middle trapezius      Lower trapezius      Elbow flexion 5 4+ 5 5  Elbow extension 5 4+ 5 5  Wrist flexion 5 4 5  4+  Wrist extension 5 4 5  4+  Wrist ulnar deviation      Wrist radial deviation      Wrist pronation 5 4 5    Wrist supination 5 4 5    (Blank rows = not tested)  HAND FUNCTION: Grip strength: Right: 59 lbs; Left: 46 lbs, Lateral pinch: Right: 19 lbs, Left: 20 lbs, and 3 point pinch: Right: 15 lbs, Left: 14 lbs Grip strength: Right: 59 lbs; Left: 45 lbs, Lateral pinch: Right: 19 lbs, Left: 17 lbs, and 3 point pinch: Right: 17 lbs, Left: 13 lbs  COORDINATION: 9 Hole Peg test: Right: 31 sec; Left: 45 sec 9 Hole Peg test: Right: 31 sec; Left: 55 sec  SENSATION: Positive numbness, and tingling Light touch: WFL Proprioception: WFL   EDEMA: N/A  MUSCLE TONE: Intact  COGNITION: Overall cognitive status: Within functional limits for tasks assessed  VISION: Subjective report:  Reports  increased  blurriness with distance, most notably  with driving  Pt. Reports having an eye exam scheduled VISION ASSESSMENT: To be further assessed in functional context  PERCEPTION: WFL  PRAXIS: Impaired: Motor planning   TODAY'S TREATMENT:  DATE: 10/16/2022 Neuromuscular Reeducation:  Pt performed [facility] tasks using the Grooved pegboard. Pt worked on grasping the grooved pegs from a horizontal position and worked on Comptroller, moving the pegs to a vertical position in the hand to prepare for placing them in the grooved slot. Pt. Removed grooved pegs using tip pinch and thumb opposition alternating between the 2-5th digit.   Therapeutic Exercise:  Pt completed x3 rounds of Connect 4 to facilitate shoulder flexion and horizontal ab/adduction. Pt. performed left gross gripping with a gross grip strengthener to remove jumbo pegs from pegboard. Pt. Worked on sustaining grip while grasping pegs. The gripper was set to 23.4# of grip strength.  PATIENT EDUCATION: Education details:  HEP progression (dumbbells for L elbow, forearm, wrist strengthening) Person educated: Patient Education method: Explanation, Demonstration, and Verbal cues, written handout Education comprehension: verbalized understanding, returned demonstration, and needs further education  HOME EXERCISE PROGRAM: Strengthening for L elbow, forearm, wrist using dumbbell, theraputty exercises for L hand strengthening  GOALS: Goals reviewed with patient? Yes  SHORT TERM GOALS: Target date: 11/10/22               1. Pt. will increase FOTO score by 2 points to reflect Pt. perceived improvement with assessment specific ADL/IADL's.   Baseline: Eval: FOTO score: 71 with TR score: 70     09/29/22: FOTO score: 63 with TR score: 70 Goal status: Ongoing  2.  Pt. Will be independent with HEPs of the LUE. Baseline: Eval: No current HEP 09/29/22: Completing theraputty exercises at home but not dumbbell exercises Goal status: Ongoing  LONG TERM GOALS: Target date: 11/12/2022    Pt. Will increase LUE strength by 2 mm grades to assist with ADLs, and IADLs Baseline: Eval: Left shoulder flexion: 4+, abduction: 4+, elbow flexion: 4+, extension:4+, forearm  supination:4 , wrist extension: 4 09/29/22: Left shoulder flexion: 4+, abduction: 4+, elbow flexion: 5, extension:5 , wrist extension: 4+ Goal status: Ongoing  2.  Pt. Will increase left grip strength by 5# to be able to securely hold objects, and open containers, or jars. Baseline: Eval: Right: 59 Left: 46  09/29/22: Right: 59 lbs; Left: 45 lbs Goal status: Ongoing  3.  Pt. Will improve left pinch strength to be able to be able to push his belt through his pant loop independently Baseline: Eval: Pinch strength: lateral: right: 19# left: 20#, 3pt.: Right: 15#, Left:14#  09/29/22: Lateral pinch: Right: 19 lbs, Left: 17 lbs, and 3 point pinch: Right: 17 lbs, Left: 13 lbs Goal status: Ongoing  4.  Pt. Will improve left hand [facility] skills by 3 sec. Of speed to be able to button his shirt efficiently Baseline: Eval:  9 hole peg test: Right:  31 sec., Left: 45 sec. 09/29/22: 9 Hole Peg test: Right: 31 sec; Left: 55 sec Goal status: Ongoing  5.  Pt. Will Pt. Will consistently, and efficiently reach for targets with the left UE with 100% accuracy. Baseline: Eval: Pt. presents with decreased motor control affecting the accuracy of reaching for objects with the left hand when challenged with speed. Goal status: Ongoing     ASSESSMENT:  CLINICAL IMPRESSION Pt. Was able to use tip pinch to pick up the grooved pegs followed by translatory movements however required increased time and dropped multiple pegs due to lack of sensation in the L hand. Pt. performed gross gripping with a gross gripping strengthener at 23.4# resistance for 3 sets and required less rest breaks compared to last therapy session. Pt dropped  a total of 2 pegs throughout all three trials. Pt. Was able to use L shoulder flexion and abduction to place connect four chips into the slots for a total of three games however Pt. Required frequent breaks and reported his muscle felt tired.  Pt. Was able to use tip pinch and thumb opposition to place  circular 1/2 inch pegs onto peg board however required increased time. Pt.continues to benefit from skilled OT services to work on improving left hand function in order to increase engagement in, and maximize independence with ADLs, and IADL tasks.   PERFORMANCE DEFICITS: in functional skills including ADLs, IADLs, coordination, dexterity, proprioception, ROM, strength, and pain, cognitive skills including , and psychosocial skills including coping strategies and environmental adaptation.   IMPAIRMENTS: are limiting patient from ADLs, IADLs, and leisure.   CO-MORBIDITIES: may have co-morbidities  that affects occupational performance. Patient will benefit from skilled OT to address above impairments and improve overall function.  MODIFICATION OR ASSISTANCE TO COMPLETE EVALUATION: Min-Moderate modification of tasks or assist with assess necessary to complete an evaluation.  OT OCCUPATIONAL PROFILE AND HISTORY: Detailed assessment: Review of records and additional review of physical, cognitive, psychosocial history related to current functional performance.  CLINICAL DECISION MAKING: Moderate - several treatment options, min-mod task modification necessary  REHAB POTENTIAL: Good  EVALUATION COMPLEXITY: Moderate    PLAN:  OT FREQUENCY: 2x/week  OT DURATION: 12 weeks  PLANNED INTERVENTIONS: self care/ADL training, therapeutic exercise, therapeutic activity, neuromuscular re-education, manual therapy, and functional mobility training  RECOMMENDED OTHER SERVICES: N/A  CONSULTED AND AGREED WITH PLAN OF CARE: Patient  PLAN FOR NEXT SESSION: see above  Herma Carson OTS 5:25 PM, 10/16/2022  This entire session was performed under the direct supervision and direction of a licensed therapist. I have personally read, edited, and approve of the note as written.   Olegario Messier, MS, OTR/L

## 2022-10-20 ENCOUNTER — Ambulatory Visit: Payer: Medicare HMO | Admitting: Occupational Therapy

## 2022-10-20 DIAGNOSIS — M6281 Muscle weakness (generalized): Secondary | ICD-10-CM | POA: Diagnosis not present

## 2022-10-20 DIAGNOSIS — R278 Other lack of coordination: Secondary | ICD-10-CM

## 2022-10-20 NOTE — Therapy (Addendum)
Occupational Therapy Neuro Treatment Note  Patient Name: Blake [name] MRN: [id number] DOB:[birth year], 77 y.o., male Today's Date: 10/20/2022  PCP: Dr. Judithann Sheen, MD REFERRING PROVIDER: Dr. Cristopher Peru, MD  END OF SESSION:  OT End of Session - 10/20/22 1320     Visit Number 14    Number of Visits 24    Date for OT Re-Evaluation 11/12/22    OT Start Time 1315    OT Stop Time 1400    OT Time Calculation (min) 45 min    Activity Tolerance Patient tolerated treatment well    Behavior During Therapy [facility] for tasks assessed/performed             Past Medical History:  Diagnosis Date   Arthritis    Back pain    with leg pain   Basal cell carcinoma 09/04/2009   R cheek 5.5 cm ant to earlobe - 12/26/2009 excision   Basal cell carcinoma 09/04/2009   R ant nasal alar rim   Basal cell carcinoma 07/31/2014   R mid brow   Basal cell carcinoma 07/30/2016   R ant nasal alar rim at ant edge of BCC scar   Basal cell carcinoma 11/02/2019   L zygoma    Basal cell carcinoma 07/22/2021   left sideburn area, EDC   Basal cell carcinoma 01/02/2022   Right posterior shoulder, EDC   Basal cell carcinoma 01/02/2022   R posterior shoulder, EDC   Basal cell carcinoma 01/02/2022   Right Supraclavicular Base of Neck, EDC   Cancer of base of tongue (HCC) 2006   s/p chemoradiation   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, bilateral    Dyspnea    Dysrhythmia    Esophageal cancer (HCC) 2021   GERD (gastroesophageal reflux disease)    Hyperlipidemia    Hypertension    Numbness and tingling of both lower extremities    with positioning   Paroxysmal atrial fibrillation (HCC)    Port-A-Cath in place 2006   Prostate enlargement    Squamous cell carcinoma of skin 12/14/2019   L pretibial - ED&C    T2DM (type 2 diabetes mellitus) (HCC)    Past Surgical History:  Procedure Laterality Date   ABDOMINAL SURGERY     CAROTID PTA/STENT INTERVENTION Right 07/04/2021   Procedure: CAROTID  PTA/STENT INTERVENTION;  Surgeon: Annice Needy, MD;  Location: [facility];  Service: Cardiovascular;  Laterality: Right;   COLONOSCOPY  12/08/2003   COLONOSCOPY WITH PROPOFOL N/A 03/21/2019   Procedure: COLONOSCOPY WITH PROPOFOL;  Surgeon: Toledo, Boykin Nearing, MD;  Location: [facility];  Service: Gastroenterology;  Laterality: N/A;   ESOPHAGOGASTRODUODENOSCOPY (EGD) WITH PROPOFOL N/A 03/21/2019   Procedure: ESOPHAGOGASTRODUODENOSCOPY (EGD) WITH PROPOFOL;  Surgeon: Toledo, Boykin Nearing, MD;  Location: [facility] ENDOS[facility];  Service: Gastroenterology;  Laterality: N/A;   esophogeal cancer     JOINT REPLACEMENT Right    total hip   LUMBAR LAMINECTOMY/DECOMPRESSION MICRODISCECTOMY N/A 03/03/2018   Procedure: LUMBAR LAMINECTOMY/DECOMPRESSION MICRODISCECTOMY 1 LEVEL-L3-4,L4-5;  Surgeon: Venetia Night, MD;  Location: [facility] OR[facility];  Service: Neurosurgery;  Laterality: N/A;   LUMBAR LAMINECTOMY/DECOMPRESSION MICRODISCECTOMY N/A 02/24/2020   Procedure: LEFT L3-4 MICRODISCECTOMY, L4-5 DECOMPRESSION;  Surgeon: Venetia Night, MD;  Location: [facility];  Service: Neurosurgery;  Laterality: N/A;   MAXIMUM ACCESS (MAS) TRANSFORAMINAL LUMBAR INTERBODY FUSION (TLIF) 1 LEVEL N/A 07/13/2020   Procedure: OPEN L3-4 TRANSFORAMINAL LUMBAR INTERBODY FUSION (TLIF);  Surgeon: Venetia Night, MD;  Location: [facility];  Service: Neurosurgery;  Laterality: N/A;   PARTIAL HIP  ARTHROPLASTY Right 2014   PORT-A-CATH REMOVAL     PORTACATH PLACEMENT Left 01/12/2020   Procedure: INSERTION PORT-A-CATH, with ultrasound fluoroscopy;  Surgeon: Hulda Marin, MD;  Location: [facility] OR[facility];  Service: General;  Laterality: Left;   TONGUE BIOPSY  2006   TONSILLECTOMY     TRIGGER FINGER RELEASE Right 2013   UPPER GI ENDOSCOPY  01/25/14   multiple gastric polyps   Patient Active Problem List   Diagnosis Date Noted   Hypocalcemia 06/02/2022   IDA (iron deficiency anemia) 05/29/2022   Loss of consciousness (HCC) 04/25/2022    Paresthesia 04/25/2022   Encephalomalacia on imaging study 04/25/2022   Positive D dimer 04/25/2022   Vasovagal syncope 04/25/2022   Acidosis 04/25/2022   Sinus bradycardia 04/25/2022   Anemia 04/25/2022   Paraseptal emphysema (HCC) 02/24/2022   Lung nodules 01/01/2022   Carotid stenosis, right 07/04/2021   Paroxysmal A-fib (HCC) 02/26/2021   Spondylolisthesis 07/13/2020   Chest pain 06/26/2020   Hypertensive urgency 06/26/2020   Shortness of breath 06/26/2020   Pneumonia 01/17/2020   Non-intractable vomiting 12/26/2019   Esophageal carcinoma (HCC) 12/09/2019   History of head and neck cancer 12/09/2019   Chronic pain syndrome 12/08/2019   History of lumbar laminectomy 12/08/2019   Supraventricular tachycardia 12/08/2019   Acute postoperative pain 09/13/2019   Malignant neoplasm of lower third of esophagus (HCC) 07/14/2019   Goals of care, counseling/discussion 05/04/2019   Osteoarthritis 04/26/2019   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   Bilateral hand numbness 12/16/2018   Bilateral hand pain 12/16/2018   Lumbar radiculopathy 06/02/2017   Lumbar degenerative disc disease 06/02/2017   Spinal stenosis, lumbar region, 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   Primary osteoarthritis of right shoulder 07/01/2016   Bilateral carotid artery stenosis 06/05/2016   Carotid stenosis 04/22/2016   Allergic rhinitis 08/24/2015   Diabetes mellitus (HCC) 08/24/2015   Personal history of malignant neoplasm of tongue 08/24/2015   HLD (hyperlipidemia) 08/24/2015   Gastric polyposis 08/24/2015   Stenosing tenosynovitis of finger 07/11/2014   Coronary artery disease 06/26/2014   Family history of cancer of digestive system 10/20/2006   Deficiency, 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 Pt. Reports that he is feeling good today however his L arm feels a little tired.   Pt accompanied by: self  PERTINENT HISTORY:   Pt. 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 Pt.'s PMHx includes: Right carotid artery stenosis with stenting with further stenosis around the current stents, Esophageal CA, Type2 DM, Chronic back pain with  L3-L4, L5 Lumbar Decompression. Pt. Has a heart monitor placed.  PRECAUTIONS: None  WEIGHT BEARING RESRICTIONS: No  PAIN:  Are you having pain? No pain reported   FALLS: Has patient fallen in last 6 months? No  LIVING ENVIRONMENT: Lives with: lives alone Lives in: House/apartment Stairs: level entry, 2 story Has following equipment at home: Single point cane, Environmental consultant - 2 wheeled, Environmental consultant - 4 wheeled, shower chair, Shower bench, and Grab bars  PLOF: Independent  PATIENT GOALS: To get his left hand back  OBJECTIVE:   HAND DOMINANCE: Right  ADLs:  Transfers/ambulation related to ADLs: Eating: Independent, modified technique for cutting food Grooming: Independent UB Dressing:  Independent LB Dressing: Increased time to tie tennis shoes Toileting: Independent Bathing: Independent Tub Shower transfers:  Independent with walkin shower   IADLs: Shopping: Independent Light housekeeping:  Pt. Has a personal housekeeper Meal Prep: Minimal cooking(At baseline), able to do light meal prep. Medication management: Independent Financial management:  No change Handwriting:  Uses his dominant right hand  MOBILITY STATUS: Independent   Activity tolerance: Intact  FUNCTIONAL OUTCOME MEASURES: FOTO: 71 TR score: 70  UPPER  EXTREMITY ROM:    Active ROM Right Eval WFL Left Eval [facility]  Shoulder flexion    Shoulder abduction    Shoulder adduction    Shoulder extension    Shoulder internal rotation    Shoulder external rotation    Elbow flexion    Elbow extension    Wrist flexion    Wrist extension    Wrist ulnar deviation    Wrist radial deviation    Wrist pronation    Wrist supination    (Blank rows = not tested)  UPPER EXTREMITY MMT:     MMT Right eval Left eval Right 09/29/22 Left 09/29/22  Shoulder flexion 4+ 4+ 5 4+  Shoulder abduction 4+ 4+ 4+ 4+  Shoulder adduction      Shoulder extension      Shoulder internal rotation      Shoulder external rotation      Middle trapezius      Lower trapezius      Elbow flexion 5 4+ 5 5  Elbow extension 5 4+ 5 5  Wrist flexion 5 4 5  4+  Wrist extension 5 4 5  4+  Wrist ulnar deviation      Wrist radial deviation      Wrist pronation 5 4 5    Wrist supination 5 4 5    (Blank rows = not tested)  HAND FUNCTION: Grip strength: Right: 59 lbs; Left: 46 lbs, Lateral pinch: Right: 19 lbs, Left: 20 lbs, and 3 point pinch: Right: 15 lbs, Left: 14 lbs Grip strength: Right: 59 lbs; Left: 45 lbs, Lateral pinch: Right: 19 lbs, Left: 17 lbs, and 3 point pinch: Right: 17 lbs, Left: 13 lbs  COORDINATION: 9 Hole Peg test: Right: 31 sec; Left: 45 sec 9 Hole Peg test: Right: 31 sec; Left: 55 sec  SENSATION: Positive numbness, and tingling Light touch: WFL Proprioception: WFL   EDEMA: N/A  MUSCLE TONE: Intact  COGNITION: Overall cognitive status: Within functional limits for tasks assessed  VISION: Subjective report:  Reports  increased  blurriness with distance, most notably  with driving  Pt. Reports having an eye exam scheduled VISION ASSESSMENT: To be further assessed in functional context  PERCEPTION: WFL  PRAXIS: Impaired: Motor planning   TODAY'S TREATMENT:  DATE:10/20/2022 Neuromuscular Reeducation:  Pt. Worked on L [facility] skills by using tip pinch to pick up 1/2" circular pegs followed by translatory movements to move the 1/2" circular peg from the palm to the 2nd digit before placing them onto the peg board. Pt. Removed the 1/2" circular pegs using resistive tweezers with three point pinch.   Therapeutic Exercise:  Pt. performed left gross gripping with a gross grip strengthener. Pt. Worked on sustaining grip while grasping pegs. The gripper was set to 23.4# of grip strength and Pt. Performed 3 trials. Pt. Performed 3# dumbbell exercises including elbow extension/flexion, wrist flexion/extension, forearm pronation/supination, and radial/ulnar deviation. Pt. Performed 10 reps of each exercise for 2 sets.   PATIENT EDUCATION: Education details:  HEP progression (dumbbells for L elbow, forearm, wrist strengthening) Person educated: Patient Education method: Explanation, Demonstration, and Verbal cues, written handout Education comprehension: verbalized understanding, returned demonstration, and needs further education  HOME EXERCISE PROGRAM: Strengthening for L elbow, forearm, wrist using dumbbell, theraputty exercises for L hand strengthening  GOALS: Goals reviewed with patient? Yes  SHORT TERM GOALS: Target date: 11/10/22               1. Pt. will increase FOTO score by 2 points to reflect Pt. perceived improvement with assessment specific ADL/IADL's.   Baseline: Eval: FOTO score: 71 with TR score: 70     09/29/22: FOTO score: 63 with TR score: 70 Goal status: Ongoing  2.  Pt. Will be independent with HEPs of the LUE. Baseline: Eval: No current HEP 09/29/22: Completing theraputty exercises at home but not dumbbell exercises Goal status: Ongoing  LONG TERM GOALS: Target date: 11/12/2022    Pt. Will increase LUE strength by 2 mm grades to assist with ADLs, and IADLs Baseline: Eval: Left shoulder flexion: 4+,  abduction: 4+, elbow flexion: 4+, extension:4+, forearm supination:4 , wrist extension: 4 09/29/22: Left shoulder flexion: 4+, abduction: 4+, elbow flexion: 5, extension:5 , wrist extension: 4+ Goal status: Ongoing  2.  Pt. Will increase left grip strength by 5# to be able to securely hold objects, and open containers, or jars. Baseline: Eval: Right: 59 Left: 46  09/29/22: Right: 59 lbs; Left: 45 lbs Goal status: Ongoing  3.  Pt. Will improve left pinch strength to be able to be able to push his belt through his pant loop independently Baseline: Eval: Pinch strength: lateral: right: 19# left: 20#, 3pt.: Right: 15#, Left:14#  09/29/22: Lateral pinch: Right: 19 lbs, Left: 17 lbs, and 3 point pinch: Right: 17 lbs, Left: 13 lbs Goal status: Ongoing  4.  Pt. Will improve left hand [facility] skills by 3 sec. Of speed to be able to button his shirt efficiently Baseline: Eval:  9 hole peg test: Right:  31 sec., Left: 45 sec. 09/29/22: 9 Hole Peg test: Right: 31 sec; Left: 55 sec Goal status: Ongoing  5.  Pt. Will Pt. Will consistently, and efficiently reach for targets with the left UE with 100% accuracy. Baseline: Eval: Pt. presents with decreased motor control affecting the accuracy of reaching for objects with the left hand when challenged with speed. Goal status: Ongoing     ASSESSMENT:  CLINICAL IMPRESSION Pt. Reports that he has been doing his theraputty exercises at home some. Pt. Reports he continues to feel better from being sick and has a Dr. Appointment this Thursday for a follow up for his L lung. Pt. Tolerated all therapeutic exercise well today. Pt. Was able to use the 3# dumbbell however required frequent  rest breaks today due to L arm fatigue during the second set. Pt. Performed gross gripping with a gross gripping strengthener at 23.4# resistance for three trials. Pt. Required less rest breaks compared to last session. Pt. Was able to use tip pinch followed by translatory movements to move 1/2"  circular peg from the palm to the 2nd digit however frequently dropped multiple pegs due to lack of sensation in 2-5th digit in the L hand. Pt.continues to benefit from skilled OT services to work on improving left hand function in order to increase engagement in, and maximize independence with ADLs, and IADL tasks.   PERFORMANCE DEFICITS: in functional skills including ADLs, IADLs, coordination, dexterity, proprioception, ROM, strength, and pain, cognitive skills including , and psychosocial skills including coping strategies and environmental adaptation.   IMPAIRMENTS: are limiting patient from ADLs, IADLs, and leisure.   CO-MORBIDITIES: may have co-morbidities  that affects occupational performance. Patient will benefit from skilled OT to address above impairments and improve overall function.  MODIFICATION OR ASSISTANCE TO COMPLETE EVALUATION: Min-Moderate modification of tasks or assist with assess necessary to complete an evaluation.  OT OCCUPATIONAL PROFILE AND HISTORY: Detailed assessment: Review of records and additional review of physical, cognitive, psychosocial history related to current functional performance.  CLINICAL DECISION MAKING: Moderate - several treatment options, min-mod task modification necessary  REHAB POTENTIAL: Good  EVALUATION COMPLEXITY: Moderate    PLAN:  OT FREQUENCY: 2x/week  OT DURATION: 12 weeks  PLANNED INTERVENTIONS: self care/ADL training, therapeutic exercise, therapeutic activity, neuromuscular re-education, manual therapy, and functional mobility training  RECOMMENDED OTHER SERVICES: N/A  CONSULTED AND AGREED WITH PLAN OF CARE: Patient  PLAN FOR NEXT SESSION: see above  Herma Carson OTS 2:49 PM, 10/20/2022  This entire session was performed under the direct supervision and direction of a licensed therapist. I have personally read, edited, and approve of the note as written.   Olegario Messier, MS, OTR/L  10/20/2022

## 2022-10-22 ENCOUNTER — Ambulatory Visit: Payer: Medicare HMO | Admitting: Occupational Therapy

## 2022-10-23 ENCOUNTER — Other Ambulatory Visit: Payer: Self-pay

## 2022-10-23 DIAGNOSIS — I10 Essential (primary) hypertension: Secondary | ICD-10-CM | POA: Diagnosis not present

## 2022-10-23 DIAGNOSIS — E785 Hyperlipidemia, unspecified: Secondary | ICD-10-CM | POA: Diagnosis not present

## 2022-10-23 DIAGNOSIS — E119 Type 2 diabetes mellitus without complications: Secondary | ICD-10-CM | POA: Diagnosis not present

## 2022-10-23 DIAGNOSIS — I480 Paroxysmal atrial fibrillation: Secondary | ICD-10-CM | POA: Diagnosis not present

## 2022-10-23 MED ORDER — PREDNISONE 10 MG PO TABS
ORAL_TABLET | ORAL | 0 refills | Status: DC
  Filled 2022-10-23: qty 21, 6d supply, fill #0

## 2022-10-23 MED ORDER — TRAMADOL HCL 50 MG PO TABS
50.0000 mg | ORAL_TABLET | Freq: Four times a day (QID) | ORAL | 0 refills | Status: AC | PRN
  Filled 2022-10-23: qty 28, 7d supply, fill #0

## 2022-10-27 ENCOUNTER — Ambulatory Visit: Payer: Medicare HMO | Admitting: Occupational Therapy

## 2022-10-27 DIAGNOSIS — M6281 Muscle weakness (generalized): Secondary | ICD-10-CM | POA: Diagnosis not present

## 2022-10-27 DIAGNOSIS — R278 Other lack of coordination: Secondary | ICD-10-CM

## 2022-10-27 NOTE — Therapy (Addendum)
Occupational Therapy Neuro Treatment Note  Patient Name: Blake [name] MRN: [id number] DOB:01-Mar-1946, 77 y.o., male Today's Date: 10/27/2022  PCP: Dr. Judithann Sheen, MD REFERRING PROVIDER: Dr. Cristopher Peru, MD  END OF SESSION:  OT End of Session - 10/27/22 1338     Visit Number 15    Number of Visits 24    Date for OT Re-Evaluation 11/12/22    OT Start Time 1315    OT Stop Time 1400    OT Time Calculation (min) 45 min    Activity Tolerance Patient tolerated treatment well    Behavior During Therapy [facility] for tasks assessed/performed             Past Medical History:  Diagnosis Date   Arthritis    Back pain    with leg pain   Basal cell carcinoma 09/04/2009   R cheek 5.5 cm ant to earlobe - 12/26/2009 excision   Basal cell carcinoma 09/04/2009   R ant nasal alar rim   Basal cell carcinoma 07/31/2014   R mid brow   Basal cell carcinoma 07/30/2016   R ant nasal alar rim at ant edge of BCC scar   Basal cell carcinoma 11/02/2019   L zygoma    Basal cell carcinoma 07/22/2021   left sideburn area, EDC   Basal cell carcinoma 01/02/2022   Right posterior shoulder, EDC   Basal cell carcinoma 01/02/2022   R posterior shoulder, EDC   Basal cell carcinoma 01/02/2022   Right Supraclavicular Base of Neck, EDC   Cancer of base of tongue (HCC) 2006   s/p chemoradiation   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, bilateral    Dyspnea    Dysrhythmia    Esophageal cancer (HCC) 2021   GERD (gastroesophageal reflux disease)    Hyperlipidemia    Hypertension    Numbness and tingling of both lower extremities    with positioning   Paroxysmal atrial fibrillation (HCC)    Port-A-Cath in place 2006   Prostate enlargement    Squamous cell carcinoma of skin 12/14/2019   L pretibial - ED&C    T2DM (type 2 diabetes mellitus) (HCC)    Past Surgical History:  Procedure Laterality Date   ABDOMINAL SURGERY     CAROTID PTA/STENT INTERVENTION Right 07/04/2021   Procedure: CAROTID  PTA/STENT INTERVENTION;  Surgeon: Annice Needy, MD;  Location: [facility] CV [facility];  Service: Cardiovascular;  Laterality: Right;   COLONOSCOPY  12/08/2003   COLONOSCOPY WITH PROPOFOL N/A 03/21/2019   Procedure: COLONOSCOPY WITH PROPOFOL;  Surgeon: Toledo, Boykin Nearing, MD;  Location: [facility] ENDOS[facility];  Service: Gastroenterology;  Laterality: N/A;   ESOPHAGOGASTRODUODENOSCOPY (EGD) WITH PROPOFOL N/A 03/21/2019   Procedure: ESOPHAGOGASTRODUODENOSCOPY (EGD) WITH PROPOFOL;  Surgeon: Toledo, Boykin Nearing, MD;  Location: [facility] ENDOSCOPY;  Service: Gastroenterology;  Laterality: N/A;   esophogeal cancer     JOINT REPLACEMENT Right    total hip   LUMBAR LAMINECTOMY/DECOMPRESSION MICRODISCECTOMY N/A 03/03/2018   Procedure: LUMBAR LAMINECTOMY/DECOMPRESSION MICRODISCECTOMY 1 LEVEL-L3-4,L4-5;  Surgeon: Venetia Night, MD;  Location: [facility] ORS;  Service: Neurosurgery;  Laterality: N/A;   LUMBAR LAMINECTOMY/DECOMPRESSION MICRODISCECTOMY N/A 02/24/2020   Procedure: LEFT L3-4 MICRODISCECTOMY, L4-5 DECOMPRESSION;  Surgeon: Venetia Night, MD;  Location: [facility] ORS;  Service: Neurosurgery;  Laterality: N/A;   MAXIMUM ACCESS (MAS) TRANSFORAMINAL LUMBAR INTERBODY FUSION (TLIF) 1 LEVEL N/A 07/13/2020   Procedure: OPEN L3-4 TRANSFORAMINAL LUMBAR INTERBODY FUSION (TLIF);  Surgeon: Venetia Night, MD;  Location: [facility] ORS;  Service: Neurosurgery;  Laterality: N/A;   PARTIAL HIP  ARTHROPLASTY Right 2014   PORT-A-CATH REMOVAL     PORTACATH PLACEMENT Left 01/12/2020   Procedure: INSERTION PORT-A-CATH, with ultrasound fluoroscopy;  Surgeon: Hulda Marin, MD;  Location: [facility] ORS;  Service: General;  Laterality: Left;   TONGUE BIOPSY  2006   TONSILLECTOMY     TRIGGER FINGER RELEASE Right 2013   UPPER GI ENDOSCOPY  01/25/14   multiple gastric polyps   Patient Active Problem List   Diagnosis Date Noted   Hypocalcemia 06/02/2022   IDA (iron deficiency anemia) 05/29/2022   Loss of consciousness (HCC) 04/25/2022    Paresthesia 04/25/2022   Encephalomalacia on imaging study 04/25/2022   Positive D dimer 04/25/2022   Vasovagal syncope 04/25/2022   Acidosis 04/25/2022   Sinus bradycardia 04/25/2022   Anemia 04/25/2022   Paraseptal emphysema (HCC) 02/24/2022   Lung nodules 01/01/2022   Carotid stenosis, right 07/04/2021   Paroxysmal A-fib (HCC) 02/26/2021   Spondylolisthesis 07/13/2020   Chest pain 06/26/2020   Hypertensive urgency 06/26/2020   Shortness of breath 06/26/2020   Pneumonia 01/17/2020   Non-intractable vomiting 12/26/2019   Esophageal carcinoma (HCC) 12/09/2019   History of head and neck cancer 12/09/2019   Chronic pain syndrome 12/08/2019   History of lumbar laminectomy 12/08/2019   Supraventricular tachycardia 12/08/2019   Acute postoperative pain 09/13/2019   Malignant neoplasm of lower third of esophagus (HCC) 07/14/2019   Goals of care, counseling/discussion 05/04/2019   Osteoarthritis 04/26/2019   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   Bilateral hand numbness 12/16/2018   Bilateral hand pain 12/16/2018   Lumbar radiculopathy 06/02/2017   Lumbar degenerative disc disease 06/02/2017   Spinal stenosis, lumbar region, 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   Primary osteoarthritis of right shoulder 07/01/2016   Bilateral carotid artery stenosis 06/05/2016   Carotid stenosis 04/22/2016   Allergic rhinitis 08/24/2015   Diabetes mellitus (HCC) 08/24/2015   Personal history of malignant neoplasm of tongue 08/24/2015   HLD (hyperlipidemia) 08/24/2015   Gastric polyposis 08/24/2015   Stenosing tenosynovitis of finger 07/11/2014   Coronary artery disease 06/26/2014   Family history of cancer of digestive system 10/20/2006   Deficiency, 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 Pt. Reports he will be going out of town the week after next and then will have eye surgery the following week.  Pt accompanied by: self  PERTINENT HISTORY:   Pt. 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 Pt.'s PMHx includes: Right carotid artery stenosis with stenting with further stenosis around the current stents, Esophageal CA, Type2 DM, Chronic back pain with  L3-L4, L5 Lumbar Decompression. Pt. Has a heart monitor placed.  PRECAUTIONS: None  WEIGHT BEARING RESRICTIONS: No  PAIN:  Are you having pain? Back pain 5/10, Intermittent LUE pain 5/10  FALLS: Has patient fallen in last 6 months? No  LIVING ENVIRONMENT: Lives with: lives alone Lives in: House/apartment Stairs: level entry, 2 story Has following equipment at home: Single point cane, Environmental consultant - 2 wheeled, Environmental consultant - 4 wheeled, shower chair, Shower bench, and Grab bars  PLOF: Independent  PATIENT GOALS: To get his left hand back  OBJECTIVE:  HAND DOMINANCE: Right  ADLs:  Transfers/ambulation related to ADLs: Eating: Independent, modified technique for cutting food Grooming: Independent UB Dressing:  Independent LB Dressing: Increased time to tie tennis shoes Toileting: Independent Bathing: Independent Tub Shower transfers:  Independent with walkin shower   IADLs: Shopping: Independent Light housekeeping:  Pt. Has a personal housekeeper Meal Prep: Minimal cooking(At baseline), able to do light meal prep. Medication management: Independent Financial management:  No change Handwriting:  Uses his dominant right hand  MOBILITY STATUS: Independent   Activity tolerance: Intact  FUNCTIONAL OUTCOME MEASURES: FOTO: 71 TR score: 70  UPPER  EXTREMITY ROM:    Active ROM Right Eval WFL Left Eval [facility]  Shoulder flexion    Shoulder abduction    Shoulder adduction    Shoulder extension    Shoulder internal rotation    Shoulder external rotation    Elbow flexion    Elbow extension    Wrist flexion    Wrist extension    Wrist ulnar deviation    Wrist radial deviation    Wrist pronation    Wrist supination    (Blank rows = not tested)  UPPER EXTREMITY MMT:     MMT Right eval Left eval Right 09/29/22 Left 09/29/22  Shoulder flexion 4+ 4+ 5 4+  Shoulder abduction 4+ 4+ 4+ 4+  Shoulder adduction      Shoulder extension      Shoulder internal rotation      Shoulder external rotation      Middle trapezius      Lower trapezius      Elbow flexion 5 4+ 5 5  Elbow extension 5 4+ 5 5  Wrist flexion 5 4 5  4+  Wrist extension 5 4 5  4+  Wrist ulnar deviation      Wrist radial deviation      Wrist pronation 5 4 5    Wrist supination 5 4 5    (Blank rows = not tested)  HAND FUNCTION: Grip strength: Right: 59 lbs; Left: 46 lbs, Lateral pinch: Right: 19 lbs, Left: 20 lbs, and 3 point pinch: Right: 15 lbs, Left: 14 lbs Grip strength: Right: 59 lbs; Left: 45 lbs, Lateral pinch: Right: 19 lbs, Left: 17 lbs, and 3 point pinch: Right: 17 lbs, Left: 13 lbs  COORDINATION: 9 Hole Peg test: Right: 31 sec; Left: 45 sec 9 Hole Peg test: Right: 31 sec; Left: 55 sec  SENSATION: Positive numbness, and tingling Light touch: WFL Proprioception: WFL   EDEMA: N/A  MUSCLE TONE: Intact  COGNITION: Overall cognitive status: Within functional limits for tasks assessed  VISION: Subjective report:  Reports  increased  blurriness with distance, most notably  with driving  Pt. Reports having an eye exam scheduled VISION ASSESSMENT: To be further assessed in functional context  PERCEPTION: WFL  PRAXIS: Impaired: Motor planning   TODAY'S TREATMENT:  DATE:10/27/2022 Neuromuscular Reeducation:  Pt performed [facility] tasks using the Grooved pegboard. Pt worked on grasping the grooved pegs from a horizontal position and worked on Comptroller, moving the pegs to a vertical position in the hand to prepare for placing them in the grooved slot. Pt. Removed grooved pegs using tip pinch and thumb opposition alternating between the 2-5th digit.   Therapeutic Exercise:  Pt. performed left gross gripping with a gross grip strengthener. Pt. Worked on sustaining grip while grasping pegs. The gripper was set to 23.4# of grip strength and Pt. Performed 3 trials. Pt. Performed 3# dumbbell exercises including elbow extension/flexion, wrist flexion/extension, forearm pronation/supination, and radial/ulnar deviation. Pt. Performed 10 reps of each exercise for 2 sets.   PATIENT EDUCATION: Education details:  HEP progression (dumbbells for L elbow, forearm, wrist strengthening) Person educated: Patient Education method: Explanation, Demonstration, and Verbal cues, written handout Education comprehension: verbalized understanding, returned demonstration, and needs further education  HOME EXERCISE PROGRAM: Strengthening for L elbow, forearm, wrist using dumbbell, theraputty exercises for L hand strengthening  GOALS: Goals reviewed with patient? Yes  SHORT TERM GOALS: Target date: 11/10/22               1. Pt. will increase FOTO score by 2 points to reflect Pt. perceived improvement with assessment specific ADL/IADL's.   Baseline: Eval: FOTO score: 71 with TR score: 70     09/29/22: FOTO score: 63 with TR score: 70 Goal status: Ongoing  2.  Pt. Will be independent with HEPs of the LUE. Baseline: Eval: No current HEP 09/29/22: Completing theraputty exercises at home but not dumbbell exercises Goal status: Ongoing  LONG TERM GOALS: Target date: 11/12/2022    Pt. Will increase LUE strength by 2 mm grades to assist with  ADLs, and IADLs Baseline: Eval: Left shoulder flexion: 4+, abduction: 4+, elbow flexion: 4+, extension:4+, forearm supination:4 , wrist extension: 4 09/29/22: Left shoulder flexion: 4+, abduction: 4+, elbow flexion: 5, extension:5 , wrist extension: 4+ Goal status: Ongoing  2.  Pt. Will increase left grip strength by 5# to be able to securely hold objects, and open containers, or jars. Baseline: Eval: Right: 59 Left: 46  09/29/22: Right: 59 lbs; Left: 45 lbs Goal status: Ongoing  3.  Pt. Will improve left pinch strength to be able to be able to push his belt through his pant loop independently Baseline: Eval: Pinch strength: lateral: right: 19# left: 20#, 3pt.: Right: 15#, Left:14#  09/29/22: Lateral pinch: Right: 19 lbs, Left: 17 lbs, and 3 point pinch: Right: 17 lbs, Left: 13 lbs Goal status: Ongoing  4.  Pt. Will improve left hand [facility] skills by 3 sec. Of speed to be able to button his shirt efficiently Baseline: Eval:  9 hole peg test: Right:  31 sec., Left: 45 sec. 09/29/22: 9 Hole Peg test: Right: 31 sec; Left: 55 sec Goal status: Ongoing  5.  Pt. Will Pt. Will consistently, and efficiently reach for targets with the left UE with 100% accuracy. Baseline: Eval: Pt. presents with decreased motor control affecting the accuracy of reaching for objects with the left hand when challenged with speed. Goal status: Ongoing     ASSESSMENT:  CLINICAL IMPRESSION Pt. Reports that next week will be his last week of therapy due to an upcoming trip and eye surgery. Pt. Tolerated all therapeutic exercise well today. Pt. Was able to use the 3# dumbbell and required less frequent rest breaks today. Pt. Performed gross gripping with a gross gripping strengthener  at 23.4# resistance for three trials however the third trial was discontinued half way through due to RUE pain and cramping. Pt. Required frequent rest breaks throughout grip strengthening exercise today due to L arm fatigue. Pt. Was able to use tip  pinch to pick up grooved pegs however experienced difficulties with translatory movements due to lack of sensation in 2-5th digits. Pt. Experienced difficulties when manipulating peg from a horizontal position to a vertical position to place into the grooved peg board and resulted in him dropping multiple pegs. Pt.continues to benefit from skilled OT services to work on improving left hand function in order to increase engagement in, and maximize independence with ADLs, and IADL tasks.   PERFORMANCE DEFICITS: in functional skills including ADLs, IADLs, coordination, dexterity, proprioception, ROM, strength, and pain, cognitive skills including , and psychosocial skills including coping strategies and environmental adaptation.   IMPAIRMENTS: are limiting patient from ADLs, IADLs, and leisure.   CO-MORBIDITIES: may have co-morbidities  that affects occupational performance. Patient will benefit from skilled OT to address above impairments and improve overall function.  MODIFICATION OR ASSISTANCE TO COMPLETE EVALUATION: Min-Moderate modification of tasks or assist with assess necessary to complete an evaluation.  OT OCCUPATIONAL PROFILE AND HISTORY: Detailed assessment: Review of records and additional review of physical, cognitive, psychosocial history related to current functional performance.  CLINICAL DECISION MAKING: Moderate - several treatment options, min-mod task modification necessary  REHAB POTENTIAL: Good  EVALUATION COMPLEXITY: Moderate    PLAN:  OT FREQUENCY: 2x/week  OT DURATION: 12 weeks  PLANNED INTERVENTIONS: self care/ADL training, therapeutic exercise, therapeutic activity, neuromuscular re-education, manual therapy, and functional mobility training  RECOMMENDED OTHER SERVICES: N/A  CONSULTED AND AGREED WITH PLAN OF CARE: Patient  PLAN FOR NEXT SESSION: see above  Herma Carson OTS 2:18 PM, 10/27/2022  This entire session was performed under the direct supervision  and direction of a licensed therapist. I have personally read, edited, and approve of the note as written.   Olegario Messier, MS, OTR/L  10/27/2022

## 2022-10-28 ENCOUNTER — Ambulatory Visit (INDEPENDENT_AMBULATORY_CARE_PROVIDER_SITE_OTHER): Payer: Medicare HMO

## 2022-10-28 ENCOUNTER — Encounter (INDEPENDENT_AMBULATORY_CARE_PROVIDER_SITE_OTHER): Payer: Self-pay | Admitting: Vascular Surgery

## 2022-10-28 ENCOUNTER — Other Ambulatory Visit (INDEPENDENT_AMBULATORY_CARE_PROVIDER_SITE_OTHER): Payer: Self-pay | Admitting: Vascular Surgery

## 2022-10-28 ENCOUNTER — Other Ambulatory Visit: Payer: Self-pay

## 2022-10-28 ENCOUNTER — Ambulatory Visit (INDEPENDENT_AMBULATORY_CARE_PROVIDER_SITE_OTHER): Payer: Medicare HMO | Admitting: Vascular Surgery

## 2022-10-28 VITALS — BP 92/54 | HR 60 | Resp 16 | Wt 142.4 lb

## 2022-10-28 DIAGNOSIS — I10 Essential (primary) hypertension: Secondary | ICD-10-CM | POA: Diagnosis not present

## 2022-10-28 DIAGNOSIS — I6523 Occlusion and stenosis of bilateral carotid arteries: Secondary | ICD-10-CM

## 2022-10-28 DIAGNOSIS — E119 Type 2 diabetes mellitus without complications: Secondary | ICD-10-CM | POA: Diagnosis not present

## 2022-10-28 DIAGNOSIS — E785 Hyperlipidemia, unspecified: Secondary | ICD-10-CM

## 2022-10-28 MED ORDER — CLOPIDOGREL BISULFATE 75 MG PO TABS
75.0000 mg | ORAL_TABLET | Freq: Every day | ORAL | 6 refills | Status: DC
  Filled 2022-10-28: qty 30, 30d supply, fill #0
  Filled 2022-11-24: qty 30, 30d supply, fill #1
  Filled 2022-12-25: qty 30, 30d supply, fill #2
  Filled 2023-01-30: qty 30, 30d supply, fill #3
  Filled 2023-03-03: qty 30, 30d supply, fill #4
  Filled 2023-03-30: qty 30, 30d supply, fill #5
  Filled 2023-05-01: qty 30, 30d supply, fill #6

## 2022-10-28 NOTE — Progress Notes (Unsigned)
MRN : [id number]  Blake [name] is a 77 y.o. (1945/05/26) male who presents with chief complaint of  Chief Complaint  Patient presents with   Carotid    3 month ultrasound follow up  .  History of Present Illness: Patient returns today in follow up of his carotid disease. He is doing well today. He went to [facility] and saw the [facility] with his son recently. No focal neurologic symptoms. Specifically, the patient denies amaurosis fugax, speech or swallowing difficulties, or arm or leg weakness or numbness. Duplex today shows improvement in his velocities within the right carotid stent, now falling in the 40-59% range.  The left carotid velocities are stable in the 40-59% range as well.    Current Outpatient Medications  Medication Sig Dispense Refill   amoxicillin (AMOXIL) 500 MG capsule Take 1 capsule (500 mg total) by mouth 4 (four) times daily until all are taken beginning the morning of surgery 40 capsule 0   aspirin 81 MG EC tablet Take 1 tablet (81 mg total) by mouth daily at 6 (six) AM. Swallow whole. 30 tablet 11   azelastine (ASTELIN) 0.1 % nasal spray Place 2 sprays into both nostrils 2 (two) times daily. Use in each nostril as directed 30 mL 2   chlorhexidine (PERIDEX) 0.12 % solution Rinse with 15 mLs by Mouth Rinse route 2 (two) times daily and spit out. 473 mL 12   clopidogrel (PLAVIX) 75 MG tablet Take 1 tablet (75 mg total) by mouth daily. 30 tablet 6   cyanocobalamin 1000 MCG tablet Take 1 tablet (1,000 mcg total) by mouth daily. 90 tablet 0   diltiazem (CARDIZEM CD) 180 MG 24 hr capsule Take 1 capsule (180 mg total) by mouth daily. Please hold until you see your cardiologist 90 capsule 3   ferrous sulfate 325 (65 FE) MG tablet Take 1 tablet (325 mg total) by mouth 2 (two) times daily with a meal. 60 tablet 3   levofloxacin (LEVAQUIN) 500 MG tablet Take 1 tablet (500 mg total) by mouth daily for 10 days. 10 tablet 0   lisinopril (ZESTRIL) 10 MG tablet Take 1 tablet (10 mg  total) by mouth daily. 30 tablet 3   metoprolol succinate (TOPROL-XL) 100 MG 24 hr tablet Take 2 tablets (200 mg total) by mouth daily. 180 tablet 1   mirtazapine (REMERON) 15 MG tablet Take 1 tablet (15 mg total) by mouth Nightly. 30 tablet 11   omeprazole (PRILOSEC) 40 MG capsule Take 1 capsule (40 mg total) by mouth once daily 90 capsule 1   predniSONE (DELTASONE) 10 MG tablet 6 tablets (60 mg total) daily for 1 day, THEN 5 tablets (50 mg total) daily for 1 day, THEN 4 tablets (40 mg total) daily for 1 day, THEN 3 tablets (30 mg total) daily for 1 day, THEN 2 tablets (20 mg total) daily for 1 day, THEN 1 tablet (10 mg total) daily for 1 day. 21 tablet 0   rosuvastatin (CRESTOR) 10 MG tablet Take 1 tablet (10 mg total) by mouth once daily 90 tablet 3   tamsulosin (FLOMAX) 0.4 MG CAPS capsule TAKE 1 CAPSULE BY MOUTH ONCE DAILY. TAKE 30 MINUTES AFTER SAME MEAL EACH DAY. 90 capsule 3   Tiotropium Bromide Monohydrate (SPIRIVA RESPIMAT) 2.5 MCG/ACT AERS Inhale 2 puffs into the lungs daily. 4 g 1   traMADol (ULTRAM) 50 MG tablet Take 1 tablet (50 mg total) by mouth every 6 (six) hours as needed for pain for up  to 7 days. 28 tablet 0   erythromycin ophthalmic ointment Place 1 Application into the right eye 3 (three) times daily for 10 days. (Patient not taking: Reported on 08/20/2022) 3.5 g 0   ibuprofen (ADVIL) 800 MG tablet Take 1 tablet (800 mg total) by mouth every 8 (eight) hours as needed for pain. (Patient not taking: Reported on 08/20/2022) 60 tablet 1   methocarbamol (ROBAXIN) 500 MG tablet Take 1 tablet (500 mg total) by mouth every 6 (six) hours as needed. (Patient not taking: Reported on 08/20/2022) 90 tablet 1   No current facility-administered medications for this visit.    Past Medical History:  Diagnosis Date   Arthritis    Back pain    with leg pain   Basal cell carcinoma 09/04/2009   R cheek 5.5 cm ant to earlobe - 12/26/2009 excision   Basal cell carcinoma 09/04/2009   R ant nasal  alar rim   Basal cell carcinoma 07/31/2014   R mid brow   Basal cell carcinoma 07/30/2016   R ant nasal alar rim at ant edge of BCC scar   Basal cell carcinoma 11/02/2019   L zygoma    Basal cell carcinoma 07/22/2021   left sideburn area, [facility]   Basal cell carcinoma 01/02/2022   Right posterior shoulder, [facility]   Basal cell carcinoma 01/02/2022   R posterior shoulder, [facility]   Basal cell carcinoma 01/02/2022   Right Supraclavicular Base of Neck, [facility]   Cancer of base of tongue (HCC) 2006   s/p chemoradiation   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, bilateral    Dyspnea    Dysrhythmia    Esophageal cancer (HCC) 2021   GERD (gastroesophageal reflux disease)    Hyperlipidemia    Hypertension    Numbness and tingling of both lower extremities    with positioning   Paroxysmal atrial fibrillation (HCC)    Port-A-Cath in place 2006   Prostate enlargement    Squamous cell carcinoma of skin 12/14/2019   L pretibial - ED&C    T2DM (type 2 diabetes mellitus) (HCC)     Past Surgical History:  Procedure Laterality Date   ABDOMINAL SURGERY     CAROTID PTA/STENT INTERVENTION Right 07/04/2021   Procedure: CAROTID PTA/STENT INTERVENTION;  Surgeon: Annice Needy, MD;  Location: [facility];  Service: Cardiovascular;  Laterality: Right;   COLONOSCOPY  12/08/2003   COLONOSCOPY WITH PROPOFOL N/A 03/21/2019   Procedure: COLONOSCOPY WITH PROPOFOL;  Surgeon: Toledo, Boykin Nearing, MD;  Location: [facility];  Service: Gastroenterology;  Laterality: N/A;   ESOPHAGOGASTRODUODENOSCOPY (EGD) WITH PROPOFOL N/A 03/21/2019   Procedure: ESOPHAGOGASTRODUODENOSCOPY (EGD) WITH PROPOFOL;  Surgeon: Toledo, Boykin Nearing, MD;  Location: [facility];  Service: Gastroenterology;  Laterality: N/A;   esophogeal cancer     JOINT REPLACEMENT Right    total hip   LUMBAR LAMINECTOMY/DECOMPRESSION MICRODISCECTOMY N/A 03/03/2018   Procedure: LUMBAR LAMINECTOMY/DECOMPRESSION MICRODISCECTOMY 1 LEVEL-L3-4,L4-5;   Surgeon: Venetia Night, MD;  Location: [facility];  Service: Neurosurgery;  Laterality: N/A;   LUMBAR LAMINECTOMY/DECOMPRESSION MICRODISCECTOMY N/A 02/24/2020   Procedure: LEFT L3-4 MICRODISCECTOMY, L4-5 DECOMPRESSION;  Surgeon: Venetia Night, MD;  Location: [facility];  Service: Neurosurgery;  Laterality: N/A;   MAXIMUM ACCESS (MAS) TRANSFORAMINAL LUMBAR INTERBODY FUSION (TLIF) 1 LEVEL N/A 07/13/2020   Procedure: OPEN L3-4 TRANSFORAMINAL LUMBAR INTERBODY FUSION (TLIF);  Surgeon: Venetia Night, MD;  Location: [facility];  Service: Neurosurgery;  Laterality: N/A;   PARTIAL HIP ARTHROPLASTY Right 2014   PORT-A-CATH REMOVAL  PORTACATH PLACEMENT Left 01/12/2020   Procedure: INSERTION PORT-A-CATH, with ultrasound fluoroscopy;  Surgeon: Hulda Marin, MD;  Location: [facility];  Service: General;  Laterality: Left;   TONGUE BIOPSY  2006   TONSILLECTOMY     TRIGGER FINGER RELEASE Right 2013   UPPER GI ENDOSCOPY  01/25/14   multiple gastric polyps     Social History   Tobacco Use   Smoking status: Former    Current packs/day: 0.00    Average packs/day: 1 pack/day for 30.0 years (30.0 ttl pk-yrs)    Types: Cigarettes    Start date: 02/19/1964    Quit date: 02/18/1994    Years since quitting: 28.7   Smokeless tobacco: Never  Vaping Use   Vaping status: Never Used  Substance Use Topics   Alcohol use: Not Currently   Drug use: No      Family History  Problem Relation Age of Onset   Diabetes Mother    Congestive Heart Failure Mother    Breast cancer Mother    Lung cancer Father      Allergies  Allergen Reactions   Sulfa Antibiotics Rash      REVIEW OF SYSTEMS (Negative unless checked)   Constitutional: [] Weight loss  [] Fever  [] Chills Cardiac: [] Chest pain   [] Chest pressure   [x] Palpitations   [] Shortness of breath when laying flat   [] Shortness of breath at rest   [] Shortness of breath with exertion. Vascular:  [] Pain in legs with walking   [] Pain in legs at  rest   [] Pain in legs when laying flat   [] Claudication   [] Pain in feet when walking  [] Pain in feet at rest  [] Pain in feet when laying flat   [] History of DVT   [] Phlebitis   [] Swelling in legs   [] Varicose veins   [] Non-healing ulcers Pulmonary:   [] Uses home oxygen   [] Productive cough   [] Hemoptysis   [] Wheeze  [] COPD   [] Asthma Neurologic:  [] Dizziness  [x] Blackouts   [] Seizures   [x] History of stroke   [] History of TIA  [] Aphasia   [] Temporary blindness   [] Dysphagia   [] Weakness or numbness in arms   [] Weakness or numbness in legs Musculoskeletal:  [x] Arthritis   [] Joint swelling   [] Joint pain   [x] Low back pain Hematologic:  [] Easy bruising  [] Easy bleeding   [] Hypercoagulable state   [] Anemic  [] Hepatitis Gastrointestinal:  [] Blood in stool   [] Vomiting blood  [x] Gastroesophageal reflux/heartburn   [] Difficulty swallowing. Genitourinary:  [] Chronic kidney disease   [] Difficult urination  [x] Frequent urination  [] Burning with urination   [] Blood in urine Skin:  [] Rashes   [] Ulcers   [] Wounds Psychological:  [] History of anxiety   []  History of major depression.  Physical Examination  BP (!) 92/54 (BP Location: Right Arm)   Pulse 60   Resp 16   Wt 142 lb 6.4 oz (64.6 kg)   BMI 23.70 kg/m  Gen:  WD/WN, NAD. Appears younger than stated age. Head: [place]/AT, No temporalis wasting. Ear/Nose/Throat: Hearing grossly intact, nares w/o erythema or drainage Eyes: Conjunctiva clear. Sclera non-icteric Neck: Supple.  Trachea midline. No bruit.  Pulmonary:  Good air movement, no use of accessory muscles.  Cardiac: RRR, no JVD Vascular:  Vessel Right Left  Radial Palpable Palpable           Musculoskeletal: M/S 5/5 throughout.  No deformity or atrophy. No edema. Neurologic: Sensation grossly intact in extremities.  Symmetrical.  Speech is fluent.  Psychiatric: Judgment intact, Mood & affect appropriate  for pt's clinical situation. Dermatologic: No rashes or ulcers noted.  No cellulitis or  open wounds.      Labs No results found for this or any previous visit (from the past 2160 hour(s)).  Radiology US Abdomen Complete  Result Date: 10/16/2022 CLINICAL DATA:  77 year old male with abdominal pain. History of esophageal cancer. EXAM: ABDOMEN ULTRASOUND COMPLETE COMPARISON:  07/03/2022 CT FINDINGS: Gallbladder: Small gallstones are noted, the largest measuring 4 mm. There is no evidence of gallbladder wall thickening, pericholecystic fluid or sonographic Murphy sign. Common bile duct: Diameter: 5 mm. There is no evidence of intrahepatic or extrahepatic biliary dilatation. Liver: No focal lesion identified. Within normal limits in parenchymal echogenicity. Portal vein is patent on color Doppler imaging with normal direction of blood flow towards the liver. IVC: No abnormality visualized. Pancreas: Visualized portion unremarkable. Spleen: Size and appearance within normal limits. Right Kidney: Length: 10.1 cm with cortical thinning. Echogenicity within normal limits. No solid mass or hydronephrosis visualized. Small cysts are present. Left Kidney: Length: 10.2 cm with cortical thinning. Echogenicity within normal limits. No solid mass or hydronephrosis visualized. Small cysts are present. Abdominal aorta: No aneurysm visualized. Other findings: None. IMPRESSION: 1. No acute abnormality.  Unremarkable liver. 2. Cholelithiasis without evidence of acute cholecystitis. 3. Bilateral renal cortical thinning. Electronically Signed   By: Harmon Pier M.D.   On: 10/16/2022 15:39    Assessment/Plan Essential (primary) hypertension blood pressure control important in reducing the progression of atherosclerotic disease. On appropriate oral medications.     Diabetes mellitus (HCC) blood glucose control important in reducing the progression of atherosclerotic disease. Also, involved in wound healing. On appropriate medications.     HLD (hyperlipidemia) lipid control important in reducing the  progression of atherosclerotic disease. Continue statin therapy  Bilateral carotid artery stenosis S/p right carotid stent a little over a year ago. Duplex today shows improvement in his velocities within the right carotid stent, now falling in the 40-59% range.  The left carotid velocities are stable in the 40-59% range as well.  Continue aspirin, Plavix and statin agent.  Recheck in 6 months.     Festus Barren, MD  10/30/2022 10:40 AM    This note was created with Dragon medical transcription system.  Any errors from dictation are purely unintentional

## 2022-10-30 ENCOUNTER — Ambulatory Visit: Payer: Medicare HMO | Attending: Neurology | Admitting: Occupational Therapy

## 2022-10-30 DIAGNOSIS — M6281 Muscle weakness (generalized): Secondary | ICD-10-CM | POA: Insufficient documentation

## 2022-10-30 DIAGNOSIS — R278 Other lack of coordination: Secondary | ICD-10-CM | POA: Diagnosis not present

## 2022-10-30 NOTE — Assessment & Plan Note (Signed)
S/p right carotid stent a little over a year ago. Duplex today shows improvement in his velocities within the right carotid stent, now falling in the 40-59% range.  The left carotid velocities are stable in the 40-59% range as well.  Continue aspirin, Plavix and statin agent.  Recheck in 6 months.

## 2022-10-30 NOTE — Therapy (Addendum)
Occupational Therapy Neuro Discharge Note  Patient Name: Blake [name] MRN: [id number] DOB:02/16/1946, 77 y.o., male Today's Date: 10/30/2022  PCP: Dr. Judithann Sheen, MD REFERRING PROVIDER: Dr. Cristopher Peru, MD  END OF SESSION:  OT End of Session - 10/30/22 1449     Visit Number 16    Number of Visits 24    Date for OT Re-Evaluation 11/12/22    OT Start Time 1445    OT Stop Time 1515    OT Time Calculation (min) 30 min    Activity Tolerance Patient tolerated treatment well    Behavior During Therapy [facility] for tasks assessed/performed             Past Medical History:  Diagnosis Date   Arthritis    Back pain    with leg pain   Basal cell carcinoma 09/04/2009   R cheek 5.5 cm ant to earlobe - 12/26/2009 excision   Basal cell carcinoma 09/04/2009   R ant nasal alar rim   Basal cell carcinoma 07/31/2014   R mid brow   Basal cell carcinoma 07/30/2016   R ant nasal alar rim at ant edge of BCC scar   Basal cell carcinoma 11/02/2019   L zygoma    Basal cell carcinoma 07/22/2021   left sideburn area, EDC   Basal cell carcinoma 01/02/2022   Right posterior shoulder, EDC   Basal cell carcinoma 01/02/2022   R posterior shoulder, EDC   Basal cell carcinoma 01/02/2022   Right Supraclavicular Base of Neck, EDC   Cancer of base of tongue (HCC) 2006   s/p chemoradiation   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, bilateral    Dyspnea    Dysrhythmia    Esophageal cancer (HCC) 2021   GERD (gastroesophageal reflux disease)    Hyperlipidemia    Hypertension    Numbness and tingling of both lower extremities    with positioning   Paroxysmal atrial fibrillation (HCC)    Port-A-Cath in place 2006   Prostate enlargement    Squamous cell carcinoma of skin 12/14/2019   L pretibial - ED&C    T2DM (type 2 diabetes mellitus) (HCC)    Past Surgical History:  Procedure Laterality Date   ABDOMINAL SURGERY     CAROTID PTA/STENT INTERVENTION Right 07/04/2021   Procedure: CAROTID  PTA/STENT INTERVENTION;  Surgeon: Annice Needy, MD;  Location: [facility];  Service: Cardiovascular;  Laterality: Right;   COLONOSCOPY  12/08/2003   COLONOSCOPY WITH PROPOFOL N/A 03/21/2019   Procedure: COLONOSCOPY WITH PROPOFOL;  Surgeon: Toledo, Boykin Nearing, MD;  Location: [facility] ENDOSCOPY;  Service: Gastroenterology;  Laterality: N/A;   ESOPHAGOGASTRODUODENOSCOPY (EGD) WITH PROPOFOL N/A 03/21/2019   Procedure: ESOPHAGOGASTRODUODENOSCOPY (EGD) WITH PROPOFOL;  Surgeon: Toledo, Boykin Nearing, MD;  Location: [facility] ENDOSCOPY;  Service: Gastroenterology;  Laterality: N/A;   esophogeal cancer     JOINT REPLACEMENT Right    total hip   LUMBAR LAMINECTOMY/DECOMPRESSION MICRODISCECTOMY N/A 03/03/2018   Procedure: LUMBAR LAMINECTOMY/DECOMPRESSION MICRODISCECTOMY 1 LEVEL-L3-4,L4-5;  Surgeon: Venetia Night, MD;  Location: [facility] ORS;  Service: Neurosurgery;  Laterality: N/A;   LUMBAR LAMINECTOMY/DECOMPRESSION MICRODISCECTOMY N/A 02/24/2020   Procedure: LEFT L3-4 MICRODISCECTOMY, L4-5 DECOMPRESSION;  Surgeon: Venetia Night, MD;  Location: [facility] ORS;  Service: Neurosurgery;  Laterality: N/A;   MAXIMUM ACCESS (MAS) TRANSFORAMINAL LUMBAR INTERBODY FUSION (TLIF) 1 LEVEL N/A 07/13/2020   Procedure: OPEN L3-4 TRANSFORAMINAL LUMBAR INTERBODY FUSION (TLIF);  Surgeon: Venetia Night, MD;  Location: [facility] ORS;  Service: Neurosurgery;  Laterality: N/A;   PARTIAL HIP  ARTHROPLASTY Right 2014   PORT-A-CATH REMOVAL     PORTACATH PLACEMENT Left 01/12/2020   Procedure: INSERTION PORT-A-CATH, with ultrasound fluoroscopy;  Surgeon: Hulda Marin, MD;  Location: [facility] ORS;  Service: General;  Laterality: Left;   TONGUE BIOPSY  2006   TONSILLECTOMY     TRIGGER FINGER RELEASE Right 2013   UPPER GI ENDOSCOPY  01/25/14   multiple gastric polyps   Patient Active Problem List   Diagnosis Date Noted   Hypocalcemia 06/02/2022   IDA (iron deficiency anemia) 05/29/2022   Loss of consciousness (HCC) 04/25/2022    Paresthesia 04/25/2022   Encephalomalacia on imaging study 04/25/2022   Positive D dimer 04/25/2022   Vasovagal syncope 04/25/2022   Acidosis 04/25/2022   Sinus bradycardia 04/25/2022   Anemia 04/25/2022   Paraseptal emphysema (HCC) 02/24/2022   Lung nodules 01/01/2022   Carotid stenosis, right 07/04/2021   Paroxysmal A-fib (HCC) 02/26/2021   Spondylolisthesis 07/13/2020   Chest pain 06/26/2020   Hypertensive urgency 06/26/2020   Shortness of breath 06/26/2020   Pneumonia 01/17/2020   Non-intractable vomiting 12/26/2019   Esophageal carcinoma (HCC) 12/09/2019   History of head and neck cancer 12/09/2019   Chronic pain syndrome 12/08/2019   History of lumbar laminectomy 12/08/2019   Supraventricular tachycardia 12/08/2019   Acute postoperative pain 09/13/2019   Malignant neoplasm of lower third of esophagus (HCC) 07/14/2019   Goals of care, counseling/discussion 05/04/2019   Osteoarthritis 04/26/2019   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   Bilateral hand numbness 12/16/2018   Bilateral hand pain 12/16/2018   Lumbar radiculopathy 06/02/2017   Lumbar degenerative disc disease 06/02/2017   Spinal stenosis, lumbar region, 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   Primary osteoarthritis of right shoulder 07/01/2016   Bilateral carotid artery stenosis 06/05/2016   Carotid stenosis 04/22/2016   Allergic rhinitis 08/24/2015   Diabetes mellitus (HCC) 08/24/2015   Personal history of malignant neoplasm of tongue 08/24/2015   HLD (hyperlipidemia) 08/24/2015   Gastric polyposis 08/24/2015   Stenosing tenosynovitis of finger 07/11/2014   Coronary artery disease 06/26/2014   Family history of cancer of digestive system 10/20/2006   Deficiency, 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  Pt accompanied by: self  PERTINENT HISTORY:   Pt. 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 Pt.'s PMHx includes: Right carotid artery stenosis with stenting with further stenosis around the current stents, Esophageal CA, Type2 DM, Chronic back pain with  L3-L4, L5 Lumbar Decompression. Pt. Has a heart monitor placed.  PRECAUTIONS: None  WEIGHT BEARING RESRICTIONS: No  PAIN:  Are you having pain? Back pain 5/10, Intermittent LUE pain 5/10  FALLS: Has patient fallen in last 6 months? No  LIVING ENVIRONMENT: Lives with: lives alone Lives in: House/apartment Stairs: level entry, 2 story Has following equipment at home: Single point cane, Environmental consultant - 2 wheeled, Environmental consultant - 4 wheeled, shower chair, Shower bench, and Grab bars  PLOF: Independent  PATIENT GOALS: To get his left hand back  OBJECTIVE:   HAND DOMINANCE: Right  ADLs:  Transfers/ambulation related to ADLs: Eating: Independent, modified technique for cutting food Grooming: Independent UB Dressing:  Independent LB Dressing: Increased time to tie tennis shoes Toileting: Independent Bathing: Independent Tub Shower transfers:  Independent with walkin shower   IADLs: Shopping: Independent Light housekeeping:  Pt. Has a personal housekeeper Meal Prep: Minimal cooking(At baseline), able to do light meal prep. Medication management: Independent Financial management:  No change Handwriting:  Uses his dominant right hand  MOBILITY STATUS: Independent   Activity tolerance: Intact  FUNCTIONAL OUTCOME MEASURES: FOTO: 71 TR score: 70  UPPER EXTREMITY ROM:    Active ROM Right Eval WFL Left Eval [facility]  Shoulder flexion    Shoulder abduction     Shoulder adduction    Shoulder extension    Shoulder internal rotation    Shoulder external rotation    Elbow flexion    Elbow extension    Wrist flexion    Wrist extension    Wrist ulnar deviation    Wrist radial deviation    Wrist pronation    Wrist supination    (Blank rows = not tested)  UPPER EXTREMITY MMT:     MMT Right eval Left eval Right 09/29/22 Left 09/29/22 Right 10/30/2022 Left 10/30/2022  Shoulder flexion 4+ 4+ 5 4+ 5 4+  Shoulder abduction 4+ 4+ 4+ 4+ 4+ 4+  Shoulder adduction        Shoulder extension        Shoulder internal rotation        Shoulder external rotation        Middle trapezius        Lower trapezius        Elbow flexion 5 4+ 5 5 5 5   Elbow extension 5 4+ 5 5 5 5   Wrist flexion 5 4 5  4+ 5 5  Wrist extension 5 4 5  4+ 5 5  Wrist ulnar deviation        Wrist radial deviation        Wrist pronation 5 4 5      Wrist supination 5 4 5      (Blank rows = not tested)  HAND FUNCTION: Grip strength: Right: 59 lbs; Left: 46 lbs, Lateral pinch: Right: 19 lbs, Left: 20 lbs, and 3 point pinch: Right: 15 lbs, Left: 14 lbs Grip strength: Right: 59 lbs; Left: 45 lbs, Lateral pinch: Right: 19 lbs, Left: 17 lbs, and 3 point pinch: Right: 17 lbs, Left: 13 lbs 10/30/2022: Grip strength: Right: 59 lbs; Left: 44 lbs, Lateral pinch: Right: 19 lbs, Left: 15 lbs, and 3 point pinch: Right: 17 lbs, Left: 13 lbs COORDINATION: 9 Hole Peg test: Right: 31 sec; Left: 45 sec 9 Hole Peg test: Right: 31 sec; Left: 55 sec 10/30/2022: 9 Hole Peg test: Right 31 sec: Left 40 sec  SENSATION: Positive numbness, and tingling Light touch: WFL Proprioception: WFL   EDEMA: N/A  MUSCLE TONE: Intact  COGNITION: Overall cognitive status: Within functional limits for tasks assessed  VISION: Subjective report:  Reports  increased  blurriness with distance, most notably  with driving  Pt. Reports having an eye exam scheduled VISION ASSESSMENT: To be further assessed in functional  context  PERCEPTION: WFL  PRAXIS: Impaired: Motor planning   TODAY'S TREATMENT:  DATE:10/30/2022  Measurements obtained and goals were reviewed.  Neuromuscular Reeducation: Pt. Was given education handout for L hand coordination exercises to continue at home. Pt. Verbalized understanding.   PATIENT EDUCATION: Education details:  HEP progression (dumbbells for L elbow, forearm, wrist strengthening, L hand FMC exercises) Person educated: Patient Education method: Explanation, Demonstration, and Verbal cues, written handout Education comprehension: verbalized understanding, returned demonstration, and needs further education  HOME EXERCISE PROGRAM: Strengthening for L elbow, forearm, wrist using dumbbell, theraputty exercises for L hand strengthening, FMC L hand exercises  GOALS: Goals reviewed with patient? Yes  SHORT TERM GOALS: Target date: 11/10/22               1. Pt. will increase FOTO score by 2 points to reflect Pt. perceived improvement with assessment specific ADL/IADL's.   Baseline: Eval: FOTO score: 71 with TR score: 70  09/29/22: FOTO score: 63 with TR score: 70, 10/30/2022: FOTO:67 with TR score:70 Goal status: Discontinued  2.  Pt. Will be independent with HEPs of the LUE. Baseline: Eval: No current HEP 09/29/22: Completing theraputty exercises at home but not dumbbell exercises 10/30/2022: Pt. Reports being independent in home exercises Goal status: Met/Achieved  LONG TERM GOALS: Target date: 11/12/2022    Pt. Will increase LUE strength by 2 mm grades to assist with ADLs, and IADLs Baseline: Eval: Left shoulder flexion: 4+, abduction: 4+, elbow flexion: 4+, extension:4+, forearm supination:4 , wrist extension: 4 09/29/22: Left shoulder flexion: 4+, abduction: 4+, elbow flexion: 5, extension:5 , wrist extension: 4+ 10/30/2022: Left shoulder flexion: 4+,  abduction: 4+, elbow flexion: 5, extension:5 , wrist extension: 5 Goal status: Discontinued  2.  Pt. Will increase left grip strength by 5# to be able to securely hold objects, and open containers, or jars. Baseline: Eval: Right: 59 Left: 46  09/29/22: Right: 59 lbs; Left: 45 lbs 10/30/2022: Grip strength: Right: 59 lbs; Left: 44 lbs Goal status: Discontinued  3.  Pt. Will improve left pinch strength to be able to be able to push his belt through his pant loop independently Baseline: Eval: Pinch strength: lateral: right: 19# left: 20#, 3pt.: Right: 15#, Left:14#  09/29/22: Lateral pinch: Right: 19 lbs, Left: 17 lbs, and 3 point pinch: Right: 17 lbs, Left: 13 lbs 10/30/22: Lateral pinch: Right: 19 lbs, Left: 15 lbs, and 3 point pinch: Right: 17 lbs, Left: 13 lbs Goal status: Discontinued  4.  Pt. Will improve left hand [facility] skills by 3 sec. Of speed to be able to button his shirt efficiently Baseline: Eval:  9 hole peg test: Right:  31 sec., Left: 45 sec. 09/29/22: 9 Hole Peg test: Right: 31 sec; Left: 55 sec 10/30/2022: 9 Hole Peg test: Right 31 sec: Left 40 sec Goal status: Met/Achieved  5.  Pt. Will Pt. Will consistently, and efficiently reach for targets with the left UE with 100% accuracy. Baseline: Eval: Pt. presents with decreased motor control affecting the accuracy of reaching for objects with the left hand when challenged with speed. 10/30/2022: Pt. Reports that his motor control has improved and is able to reach for object with 100% accuracy including wine glasses.  Goal status: Met/Achieved     ASSESSMENT:  CLINICAL IMPRESSION Measurements obtained and goals were reviewed. Pt. Has progressed with L hand [facility] skills, consistently reaching for objects with increased accuracy, and becoming independent with HEP. Pt. Has returned to his baseline for L hand grip and pinch strength from being sick. Due to upcoming travel and and a upcoming surgery Pt.  Is in agreement with being discharged today. Pt.  Reports he feels ready for discharge this week in order to prepare for his upcoming trips and multiple surgeries. Pt. seems motivated to complete HEP. Pt. Was educated on L hand FMC exercises to add to HEP. Pt. Verbalized understanding and was receptive of exercises. Pt. To be discharged from OT services. Pt. In agreement with discharge.   PERFORMANCE DEFICITS: in functional skills including ADLs, IADLs, coordination, dexterity, proprioception, ROM, strength, and pain, cognitive skills including , and psychosocial skills including coping strategies and environmental adaptation.   IMPAIRMENTS: are limiting patient from ADLs, IADLs, and leisure.   CO-MORBIDITIES: may have co-morbidities  that affects occupational performance. Patient will benefit from skilled OT to address above impairments and improve overall function.  MODIFICATION OR ASSISTANCE TO COMPLETE EVALUATION: Min-Moderate modification of tasks or assist with assess necessary to complete an evaluation.  OT OCCUPATIONAL PROFILE AND HISTORY: Detailed assessment: Review of records and additional review of physical, cognitive, psychosocial history related to current functional performance.  CLINICAL DECISION MAKING: Moderate - several treatment options, min-mod task modification necessary  REHAB POTENTIAL: Good  EVALUATION COMPLEXITY: Moderate    PLAN:  OT FREQUENCY: 2x/week  OT DURATION: 12 weeks  PLANNED INTERVENTIONS: self care/ADL training, therapeutic exercise, therapeutic activity, neuromuscular re-education, manual therapy, and functional mobility training  RECOMMENDED OTHER SERVICES: N/A  CONSULTED AND AGREED WITH PLAN OF CARE: Patient  PLAN FOR NEXT SESSION: Pt. Discharged from OT services  Sabrinia Prien OTS 4:00 PM, 10/30/2022  This entire session was performed under the direct supervision and direction of a licensed therapist. I have personally read, edited, and approve of the note as written.   Olegario Messier,  MS, OTR/L   10/30/2022

## 2022-11-03 ENCOUNTER — Ambulatory Visit: Payer: Medicare HMO | Admitting: Occupational Therapy

## 2022-11-05 ENCOUNTER — Ambulatory Visit: Payer: Medicare HMO | Admitting: Occupational Therapy

## 2022-11-05 DIAGNOSIS — G939 Disorder of brain, unspecified: Secondary | ICD-10-CM | POA: Diagnosis not present

## 2022-11-05 DIAGNOSIS — G4733 Obstructive sleep apnea (adult) (pediatric): Secondary | ICD-10-CM | POA: Diagnosis not present

## 2022-11-05 DIAGNOSIS — Z8673 Personal history of transient ischemic attack (TIA), and cerebral infarction without residual deficits: Secondary | ICD-10-CM | POA: Diagnosis not present

## 2022-11-05 DIAGNOSIS — I6523 Occlusion and stenosis of bilateral carotid arteries: Secondary | ICD-10-CM | POA: Diagnosis not present

## 2022-11-10 ENCOUNTER — Ambulatory Visit: Payer: Medicare HMO

## 2022-11-11 ENCOUNTER — Inpatient Hospital Stay: Payer: Medicare HMO | Attending: Oncology

## 2022-11-11 DIAGNOSIS — Z452 Encounter for adjustment and management of vascular access device: Secondary | ICD-10-CM | POA: Insufficient documentation

## 2022-11-11 DIAGNOSIS — Z8501 Personal history of malignant neoplasm of esophagus: Secondary | ICD-10-CM | POA: Diagnosis not present

## 2022-11-11 DIAGNOSIS — Z95828 Presence of other vascular implants and grafts: Secondary | ICD-10-CM

## 2022-11-11 IMAGING — MR MR LUMBAR SPINE WO/W CM
5 of 7 series · 29 of 48 positions shown · non-contrast
Comparison: MRI lumbar spine January 23, 2020.  PET-CT 04/10/2020.

CLINICAL DATA: Upper and lower back pain with pain in both legs.
Pain is worse on the right, but numbness is worse on the left. Leg
weakness.

EXAM:
MRI THORACIC SPINE WITHOUT CONTRAST
MRI LUMBAR SPINE WITH AND WITHOUT CONTRAST
TECHNIQUE: Multiplanar and multiecho pulse sequences of the thoracic and lumbar
spine were obtained. Thoracic spine imaging was performed without
contrast and lumbar spine imaging was performed with and without
contrast.

[Series 1: T2 · sagittal · 4.0mm · 0.81mm/px · 4 of 18 slices shown (1 of 2)]
[im 1/18]
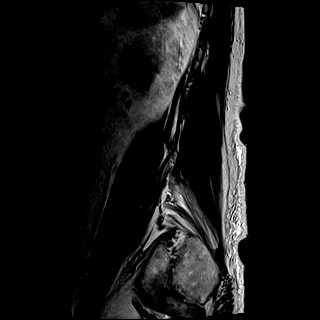
[im 6/18]
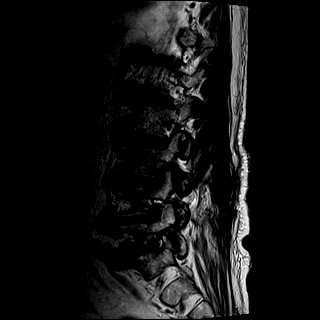
[im 12/18]
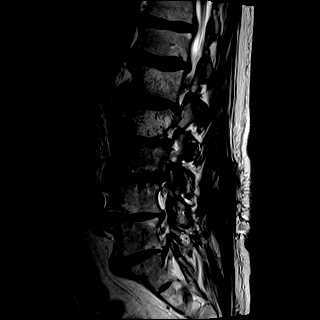
[im 18/18]
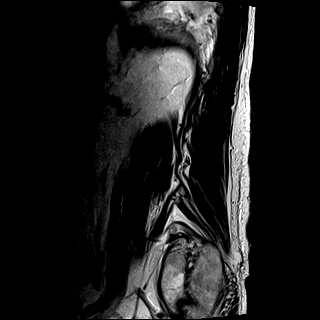

[Series 2: T1 · sagittal · 4.0mm · 0.81mm/px · 4 of 18 slices shown (1 of 2)]
[im 1/18]
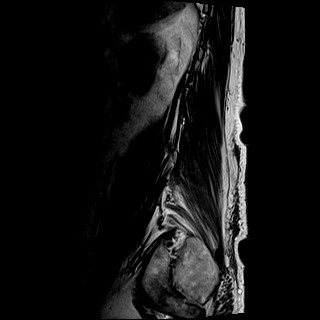
[im 6/18]
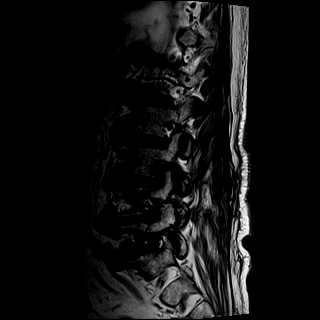
[im 12/18]
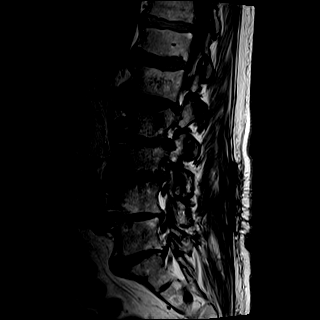
[im 18/18]
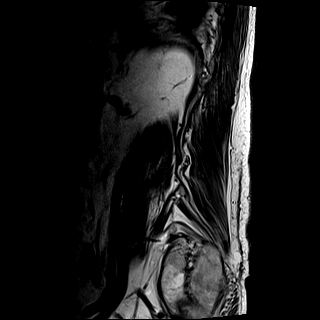

[Series 4: T2 · axial · 4.0mm · 0.78mm/px · z∈[-365,-155]mm · 8 of 37 slices shown (2 of 2)]
[im 1/37]
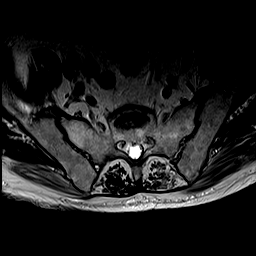
[im 5/37]
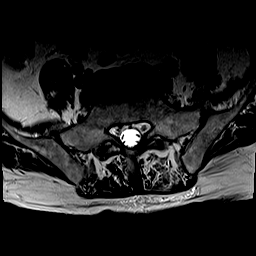
[im 13/37]
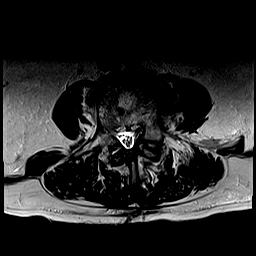
[im 17/37]
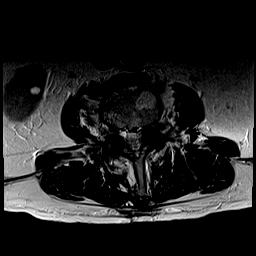
[im 21/37]
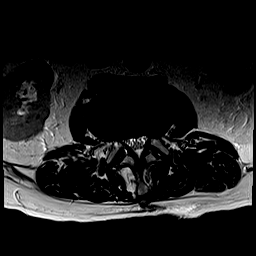
[im 25/37]
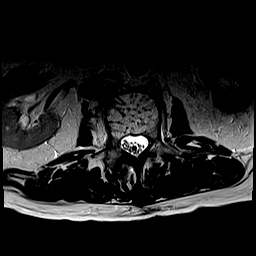
[im 33/37]
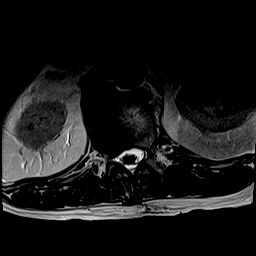
[im 37/37]
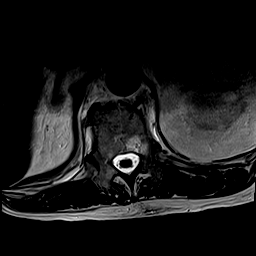

[Series 5: T1 · axial · 4.0mm · 0.39mm/px · z∈[-365,-155]mm · 8 of 37 slices shown (2 of 2)]
[im 1/37]
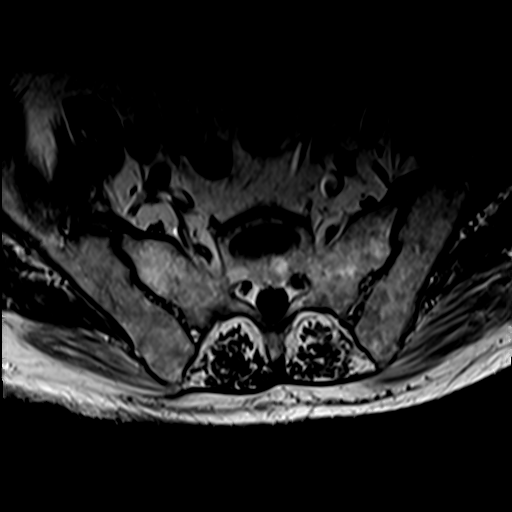
[im 5/37]
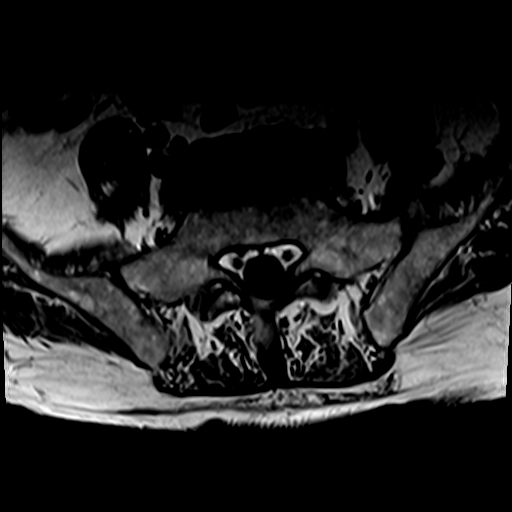
[im 13/37]
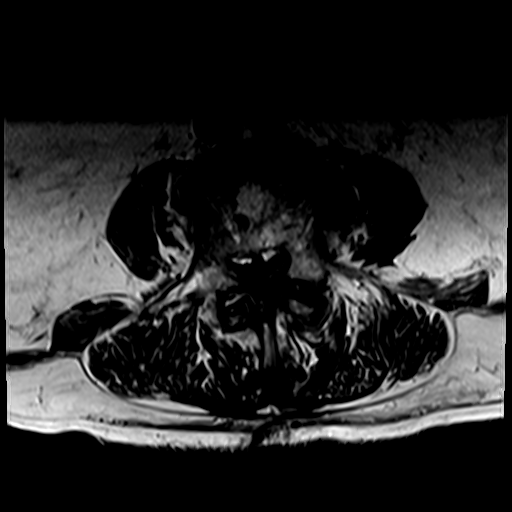
[im 17/37]
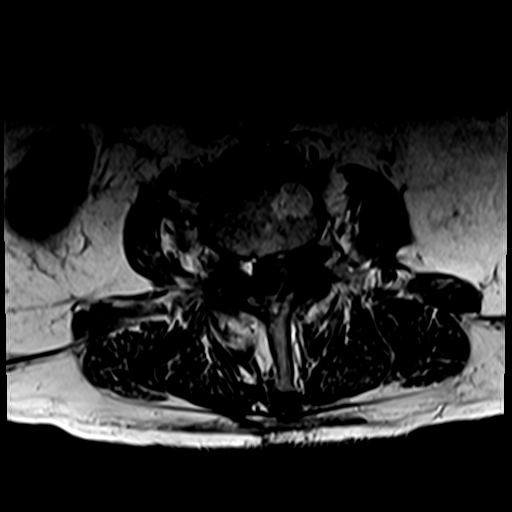
[im 21/37]
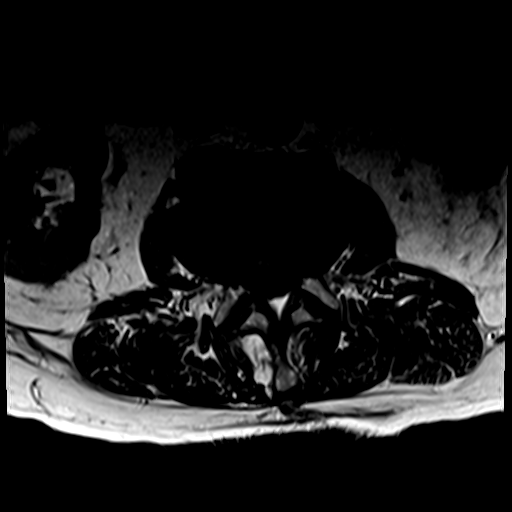
[im 25/37]
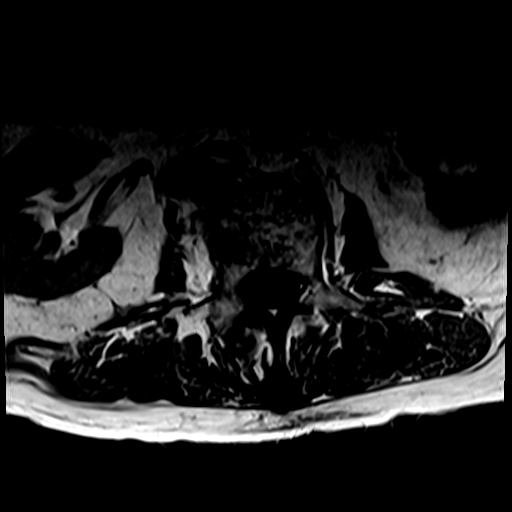
[im 33/37]
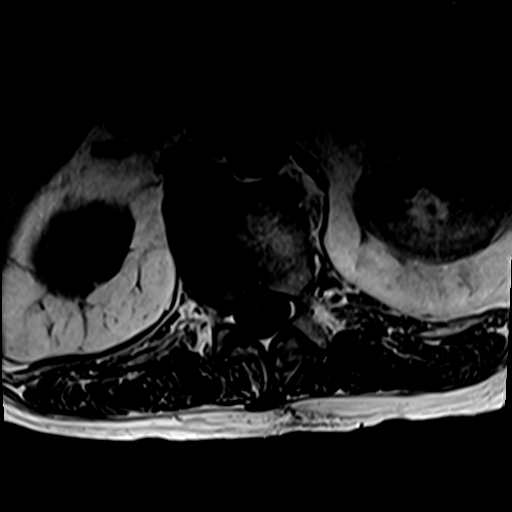
[im 37/37]
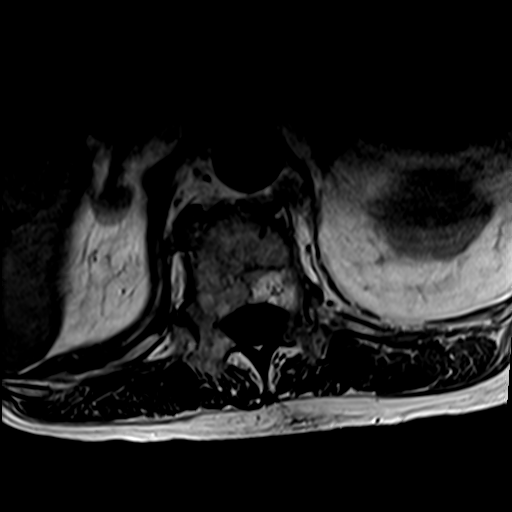

[Series 6: T1 fat-sat post-contrast · sagittal · 4.0mm · 0.81mm/px · 5 of 18 slices shown]
[im 1/18]
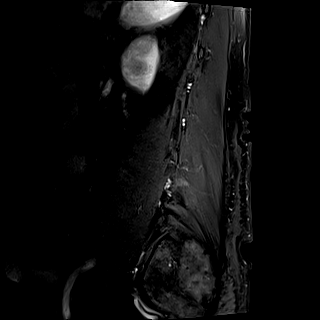
[im 5/18]
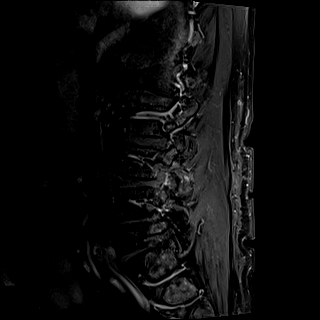
[im 9/18]
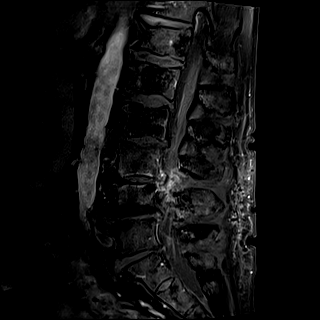
[im 13/18]
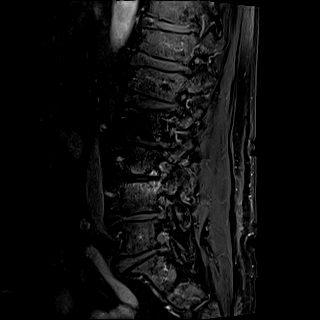
[im 18/18]
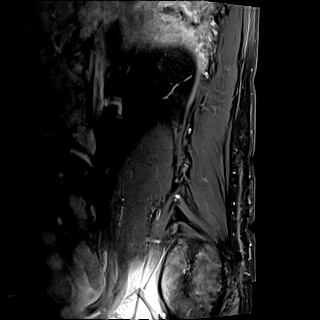

[29 of 48 positions shown; findings below may reference images not displayed]

FINDINGS: MRI THORACIC SPINE FINDINGS

Alignment:  Normal.

Vertebrae: Vertebral body heights are maintained. No specific
evidence of acute fracture, discitis/osteomyelitis, or suspicious
bone lesion. Scattered T1 hyperintense lesions throughout the
visualized spine, compatible with vertebral venous malformations
with characteristic thickened trabecula on prior CT chest.

Cord:  Normal cord signal.

Paraspinal and other soft tissues: Surgical changes of gastric
pull-through.Nodular airspace disease in the right lung with small
right pleural effusion, suboptimally evaluated on this study.

Disc levels:

Posterior disc bulges at T3-T4, T6-T7, and T7-T8 contact and indent
the ventral cord without significant canal stenosis. Disc
protrusions at T5-T6, T9-T10 partially efface ventral CSF without
significant canal stenosis. Multilevel facet hypertrophy and right
foraminal disc protrusion at T11-T12. Resulting moderate foraminal
stenosis on the left at T9-T10 and the right at T11-T12. Mild
foraminal stenosis on the right at T5-T6 and the left at T10-T11. No
evidence of high-grade foraminal stenosis.

MRI LUMBAR SPINE FINDINGS

Segmentation:  Standard.

Alignment:  Approximately 4 mm of retrolisthesis of L4 on L5.

Vertebrae: Vertebral body heights are maintained.
Degenerative/discogenic endplate signal changes about the left
eccentric L3-L4 disc. No specific evidence of acute fracture,
discitis/osteomyelitis, or suspicious bone lesion. Vertebral venous
malformations at multiple levels, largest at L1-L2.

Conus medullaris and cauda equina: Conus extends to the L2 level.
Conus appears normal

Paraspinal and other soft tissues: Right renal cysts. Otherwise,
unremarkable.

Disc levels:

T12-L1: Right foraminal disc protrusion. Mild facet hypertrophy.
Mild right foraminal stenosis. No significant canal stenosis. No
change.

L1-L2: Broad disc bulge with superimposed right foraminal disc
protrusion. Mild bilateral facet hypertrophy. No significant canal
stenosis.

L2-L3: Slight progression in broad disc bulge. Mild bilateral facet
hypertrophy and ligamentum flavum thickening. Mildly prominent
dorsal epidural fat. Resulting mild canal stenosis, slightly
progressed from prior. Moderate bilateral subarticular recess
stenosis, similar. Mild left foraminal stenosis.

L3-L4: Postsurgical changes associated with interval left
microdiscectomy. Previously seen superiorly dissecting foraminal
disc protrusion is decreased in size; however, focal masslike T2
hypointense signal abnormality in the left epidural canal (for
example series 1, image 10) is concerning for residual extruded
disc. There is some surrounding enhancing scar tissue posteriorly.
Bilateral facet hypertrophy and persistent bilateral foraminal disc
protrusions. Overall, there is resulting severe canal stenosis
(series 4, image 23), progressed from prior. Severe left and
moderate right foraminal stenosis, not substantially changed.

L4-L5: Approximately 4 mm of retrolisthesis of L4 on L5, similar.
Interval posterior decompression with improved canal and
subarticular recess stenosis, now widely patent. Left greater than
right endplate spurring with left foraminal disc protrusion. Similar
moderate to severe left foraminal stenosis.

L5-S1: Bilateral facet hypertrophy and endplate spurring. Similar
moderate to severe left and moderate right foraminal stenosis.
IMPRESSION: MR LUMBAR SPINE IMPRESSION

1. Interval left L3-L4 microdiscectomy. Decreased size of the
previously noted superiorly dissecting left foraminal disc
protrusion; however, there is T2 hypointense masslike signal
abnormality in the left epidural canal (for example, series 1, image
10), concerning for residual extruded disc material. This along with
disc bulge, biforaminal disc protrusions, and facet hypertrophy
results in progressive severe canal stenosis with similar severe
left and moderate right foraminal stenosis.
2. Interval posterior decompression at L4-L5 with improved canal and
subarticular recess stenosis, now patent.
3. Similar moderate to severe left and moderate right foraminal
stenosis at L4-L5 and L5-S1.

MR THORACIC SPINE IMPRESSION

1. Moderate foraminal stenosis on the left at T9-T10 and the right
at T11-T12. Multilevel mild foraminal stenosis, as detailed above.
2. Disc bulges at T3-T4, T6-T7, T7-T8 contact and indent the ventral
cord without significant canal stenosis.
3. Surgical changes of gastric pull-through. Partially imaged
nodular airspace disease in the right lung with small right pleural
effusion, suboptimally evaluated on this study and better
characterized on PET-CT from 04/10/2020.

## 2022-11-11 MED ORDER — SODIUM CHLORIDE 0.9% FLUSH
10.0000 mL | Freq: Once | INTRAVENOUS | Status: AC
  Administered 2022-11-11: 10 mL via INTRAVENOUS
  Filled 2022-11-11: qty 10

## 2022-11-11 MED ORDER — HEPARIN SOD (PORK) LOCK FLUSH 100 UNIT/ML IV SOLN
500.0000 [IU] | Freq: Once | INTRAVENOUS | Status: AC
  Administered 2022-11-11: 500 [IU] via INTRAVENOUS
  Filled 2022-11-11: qty 5

## 2022-11-12 ENCOUNTER — Encounter: Payer: Medicare HMO | Admitting: Occupational Therapy

## 2022-11-12 ENCOUNTER — Inpatient Hospital Stay: Payer: Medicare HMO

## 2022-11-24 ENCOUNTER — Encounter: Payer: Medicare HMO | Admitting: Occupational Therapy

## 2022-11-24 ENCOUNTER — Other Ambulatory Visit: Payer: Self-pay

## 2022-11-26 ENCOUNTER — Encounter: Payer: Medicare HMO | Admitting: Occupational Therapy

## 2022-11-27 ENCOUNTER — Encounter: Payer: Medicare HMO | Admitting: Occupational Therapy

## 2022-11-27 DIAGNOSIS — I6340 Cerebral infarction due to embolism of unspecified cerebral artery: Secondary | ICD-10-CM | POA: Diagnosis not present

## 2022-11-27 DIAGNOSIS — R0602 Shortness of breath: Secondary | ICD-10-CM | POA: Diagnosis not present

## 2022-11-27 DIAGNOSIS — R001 Bradycardia, unspecified: Secondary | ICD-10-CM | POA: Diagnosis not present

## 2022-11-27 DIAGNOSIS — I480 Paroxysmal atrial fibrillation: Secondary | ICD-10-CM | POA: Diagnosis not present

## 2022-11-27 DIAGNOSIS — E782 Mixed hyperlipidemia: Secondary | ICD-10-CM | POA: Diagnosis not present

## 2022-11-27 DIAGNOSIS — I2510 Atherosclerotic heart disease of native coronary artery without angina pectoris: Secondary | ICD-10-CM | POA: Diagnosis not present

## 2022-11-27 DIAGNOSIS — R55 Syncope and collapse: Secondary | ICD-10-CM | POA: Diagnosis not present

## 2022-11-27 DIAGNOSIS — I10 Essential (primary) hypertension: Secondary | ICD-10-CM | POA: Diagnosis not present

## 2022-12-02 ENCOUNTER — Encounter: Payer: Medicare HMO | Admitting: Occupational Therapy

## 2022-12-03 ENCOUNTER — Encounter: Payer: Medicare HMO | Admitting: Occupational Therapy

## 2022-12-04 ENCOUNTER — Other Ambulatory Visit: Payer: Self-pay

## 2022-12-04 MED ORDER — KETOROLAC TROMETHAMINE 0.5 % OP SOLN
1.0000 [drp] | Freq: Four times a day (QID) | OPHTHALMIC | 1 refills | Status: AC
  Filled 2022-12-04: qty 10, 50d supply, fill #0

## 2022-12-04 MED ORDER — GATIFLOXACIN 0.5 % OP SOLN
1.0000 [drp] | Freq: Four times a day (QID) | OPHTHALMIC | 1 refills | Status: AC
Start: 2022-12-04 — End: ?
  Filled 2022-12-04: qty 2.5, 18d supply, fill #0

## 2022-12-04 MED ORDER — PREDNISOLONE ACETATE 1 % OP SUSP
1.0000 [drp] | Freq: Four times a day (QID) | OPHTHALMIC | 1 refills | Status: AC
  Filled 2022-12-04: qty 5, 25d supply, fill #0

## 2022-12-05 ENCOUNTER — Other Ambulatory Visit: Payer: Self-pay

## 2022-12-08 ENCOUNTER — Encounter: Payer: Medicare HMO | Admitting: Occupational Therapy

## 2022-12-10 ENCOUNTER — Ambulatory Visit: Payer: Medicare HMO | Admitting: Dermatology

## 2022-12-10 ENCOUNTER — Encounter: Payer: Self-pay | Admitting: Dermatology

## 2022-12-10 VITALS — BP 115/60

## 2022-12-10 DIAGNOSIS — Z85828 Personal history of other malignant neoplasm of skin: Secondary | ICD-10-CM

## 2022-12-10 DIAGNOSIS — L219 Seborrheic dermatitis, unspecified: Secondary | ICD-10-CM | POA: Diagnosis not present

## 2022-12-10 DIAGNOSIS — W908XXA Exposure to other nonionizing radiation, initial encounter: Secondary | ICD-10-CM

## 2022-12-10 DIAGNOSIS — L578 Other skin changes due to chronic exposure to nonionizing radiation: Secondary | ICD-10-CM

## 2022-12-10 DIAGNOSIS — L729 Follicular cyst of the skin and subcutaneous tissue, unspecified: Secondary | ICD-10-CM

## 2022-12-10 DIAGNOSIS — D1801 Hemangioma of skin and subcutaneous tissue: Secondary | ICD-10-CM | POA: Diagnosis not present

## 2022-12-10 DIAGNOSIS — L821 Other seborrheic keratosis: Secondary | ICD-10-CM

## 2022-12-10 DIAGNOSIS — L570 Actinic keratosis: Secondary | ICD-10-CM

## 2022-12-10 DIAGNOSIS — Z8589 Personal history of malignant neoplasm of other organs and systems: Secondary | ICD-10-CM

## 2022-12-10 DIAGNOSIS — L820 Inflamed seborrheic keratosis: Secondary | ICD-10-CM | POA: Diagnosis not present

## 2022-12-10 DIAGNOSIS — Z1283 Encounter for screening for malignant neoplasm of skin: Secondary | ICD-10-CM | POA: Diagnosis not present

## 2022-12-10 DIAGNOSIS — Z872 Personal history of diseases of the skin and subcutaneous tissue: Secondary | ICD-10-CM

## 2022-12-10 DIAGNOSIS — L814 Other melanin hyperpigmentation: Secondary | ICD-10-CM | POA: Diagnosis not present

## 2022-12-10 DIAGNOSIS — Z7189 Other specified counseling: Secondary | ICD-10-CM

## 2022-12-10 DIAGNOSIS — L720 Epidermal cyst: Secondary | ICD-10-CM

## 2022-12-10 NOTE — Patient Instructions (Addendum)

## 2022-12-10 NOTE — Progress Notes (Signed)
Follow-Up Visit   Subjective  Blake [name] is a 77 y.o. male who presents for the following: Skin Cancer Screening and Full Body Skin Exam, hx of BCCs, hx of Scc, hx of Aks, check scaly spots face, L ear area, Bump R upper eyelid ~39m, no symptoms  The patient presents for Total-Body Skin Exam (TBSE) for skin cancer screening and mole check. The patient has spots, moles and lesions to be evaluated, some may be new or changing and the patient may have concern these could be cancer.    The following portions of the chart were reviewed this encounter and updated as appropriate: medications, allergies, medical history  Review of Systems:  No other skin or systemic complaints except as noted in HPI or Assessment and Plan.  Objective  Well appearing patient in no apparent distress; mood and affect are within normal limits.  A full examination was performed including scalp, head, eyes, ears, nose, lips, neck, chest, axillae, abdomen, back, buttocks, bilateral upper extremities, bilateral lower extremities, hands, feet, fingers, toes, fingernails, and toenails. All findings within normal limits unless otherwise noted below.   Relevant physical exam findings are noted in the Assessment and Plan.  L sideburn area x 1, L cheek x 1, R upper eyelid x 1 (3) Pink scaly macules  R shoulder x 2 (2) Stuck on waxy paps with erythema    Assessment & Plan   SKIN CANCER SCREENING PERFORMED TODAY.  ACTINIC DAMAGE - Chronic condition, secondary to cumulative UV/sun exposure - diffuse scaly erythematous macules with underlying dyspigmentation - Recommend daily broad spectrum sunscreen SPF 30+ to sun-exposed areas, reapply every 2 hours as needed.  - Staying in the shade or wearing long sleeves, sun glasses (UVA+UVB protection) and wide brim hats (4-inch brim around the entire circumference of the hat) are also recommended for sun protection.  - Call for new or changing lesions.  LENTIGINES,  SEBORRHEIC KERATOSES, HEMANGIOMAS - Benign normal skin lesions - Benign-appearing - Call for any changes  MELANOCYTIC NEVI - Tan-brown and/or pink-flesh-colored symmetric macules and papules - Benign appearing on exam today - Observation - Call clinic for new or changing moles - Recommend daily use of broad spectrum spf 30+ sunscreen to sun-exposed areas.   HISTORY OF BASAL CELL CARCINOMA OF THE SKIN - No evidence of recurrence today - Recommend regular full body skin exams - Recommend daily broad spectrum sunscreen SPF 30+ to sun-exposed areas, reapply every 2 hours as needed.  - Call if any new or changing lesions are noted between office visits  - Multiple  HISTORY OF SQUAMOUS CELL CARCINOMA OF THE SKIN - No evidence of recurrence today - No lymphadenopathy - Recommend regular full body skin exams - Recommend daily broad spectrum sunscreen SPF 30+ to sun-exposed areas, reapply every 2 hours as needed.  - Call if any new or changing lesions are noted between office visits - L pretibial   SEBORRHEIC DERMATITIS scalp Exam: Pink patches of scale scalp  Chronic and persistent condition with duration or expected duration over one year. Condition is symptomatic / bothersome to patient. Not to goal.   Seborrheic Dermatitis is a chronic persistent rash characterized by pinkness and scaling most commonly of the mid face but also can occur on the scalp (dandruff), ears; mid chest, mid back and groin.  It tends to be exacerbated by stress and cooler weather.  People who have neurologic disease may experience new onset or exacerbation of existing seborrheic dermatitis.  The condition is  not curable but treatable and can be controlled.  Treatment Plan: Start otc Head and Shoulders   Milia - tiny firm white papules - type of cyst - benign - sometimes these will clear with nightly OTC adapalene/Differin 0.1% gel or retinol. - may be extracted if symptomatic - observe  - R upper  eyelid  AK (actinic keratosis) (3) L sideburn area x 1, L cheek x 1, R upper eyelid x 1  Actinic keratoses are precancerous spots that appear secondary to cumulative UV radiation exposure/sun exposure over time. They are chronic with expected duration over 1 year. A portion of actinic keratoses will progress to squamous cell carcinoma of the skin. It is not possible to reliably predict which spots will progress to skin cancer and so treatment is recommended to prevent development of skin cancer.  Recommend daily broad spectrum sunscreen SPF 30+ to sun-exposed areas, reapply every 2 hours as needed.  Recommend staying in the shade or wearing long sleeves, sun glasses (UVA+UVB protection) and wide brim hats (4-inch brim around the entire circumference of the hat). Call for new or changing lesions.  Destruction of lesion - L sideburn area x 1, L cheek x 1, R upper eyelid x 1 (3) Complexity: simple   Destruction method: cryotherapy   Informed consent: discussed and consent obtained   Timeout:  patient name, date of birth, surgical site, and procedure verified Lesion destroyed using liquid nitrogen: Yes   Region frozen until ice ball extended beyond lesion: Yes   Outcome: patient tolerated procedure well with no complications   Post-procedure details: wound care instructions given    Inflamed seborrheic keratosis (2) R shoulder x 2  Symptomatic, irritating, patient would like treated.  Destruction of lesion - R shoulder x 2 (2) Complexity: simple   Destruction method: cryotherapy   Informed consent: discussed and consent obtained   Timeout:  patient name, date of birth, surgical site, and procedure verified Lesion destroyed using liquid nitrogen: Yes   Region frozen until ice ball extended beyond lesion: Yes   Outcome: patient tolerated procedure well with no complications   Post-procedure details: wound care instructions given       Return in about 1 year (around 12/10/2023) for  TBSE, Hx of BCC, Hx of SCC, Hx of AKs.  I, Blake [name], RMA, am acting as scribe for Armida Sans, MD .   Documentation: I have reviewed the above documentation for accuracy and completeness, and I agree with the above.  Armida Sans, MD

## 2022-12-11 ENCOUNTER — Other Ambulatory Visit: Payer: Self-pay

## 2022-12-11 ENCOUNTER — Encounter: Payer: Medicare HMO | Admitting: Occupational Therapy

## 2022-12-11 DIAGNOSIS — E119 Type 2 diabetes mellitus without complications: Secondary | ICD-10-CM | POA: Diagnosis not present

## 2022-12-11 DIAGNOSIS — Z79899 Other long term (current) drug therapy: Secondary | ICD-10-CM | POA: Diagnosis not present

## 2022-12-11 DIAGNOSIS — J449 Chronic obstructive pulmonary disease, unspecified: Secondary | ICD-10-CM | POA: Diagnosis not present

## 2022-12-11 DIAGNOSIS — R0602 Shortness of breath: Secondary | ICD-10-CM | POA: Diagnosis not present

## 2022-12-11 DIAGNOSIS — I10 Essential (primary) hypertension: Secondary | ICD-10-CM | POA: Diagnosis not present

## 2022-12-11 DIAGNOSIS — E785 Hyperlipidemia, unspecified: Secondary | ICD-10-CM | POA: Diagnosis not present

## 2022-12-11 DIAGNOSIS — J984 Other disorders of lung: Secondary | ICD-10-CM | POA: Diagnosis not present

## 2022-12-11 DIAGNOSIS — Z23 Encounter for immunization: Secondary | ICD-10-CM | POA: Diagnosis not present

## 2022-12-11 MED ORDER — PSEUDOEPHEDRINE-GUAIFENESIN ER 60-600 MG PO TB12: ORAL_TABLET | ORAL | 0 refills | Status: AC

## 2022-12-11 MED ORDER — PREDNISONE 10 MG PO TABS
ORAL_TABLET | ORAL | 0 refills | Status: DC
  Filled 2022-12-11: qty 21, 6d supply, fill #0

## 2022-12-11 MED ORDER — LEVOFLOXACIN 500 MG PO TABS
500.0000 mg | ORAL_TABLET | Freq: Every day | ORAL | 0 refills | Status: DC
  Filled 2022-12-11: qty 10, 10d supply, fill #0

## 2022-12-12 ENCOUNTER — Encounter: Payer: Self-pay | Admitting: Dermatology

## 2022-12-12 ENCOUNTER — Other Ambulatory Visit: Payer: Self-pay

## 2022-12-12 DIAGNOSIS — J449 Chronic obstructive pulmonary disease, unspecified: Secondary | ICD-10-CM | POA: Diagnosis not present

## 2022-12-12 DIAGNOSIS — E119 Type 2 diabetes mellitus without complications: Secondary | ICD-10-CM | POA: Diagnosis not present

## 2022-12-12 DIAGNOSIS — Z23 Encounter for immunization: Secondary | ICD-10-CM | POA: Diagnosis not present

## 2022-12-12 DIAGNOSIS — Z79899 Other long term (current) drug therapy: Secondary | ICD-10-CM | POA: Diagnosis not present

## 2022-12-12 DIAGNOSIS — E785 Hyperlipidemia, unspecified: Secondary | ICD-10-CM | POA: Diagnosis not present

## 2022-12-12 DIAGNOSIS — I10 Essential (primary) hypertension: Secondary | ICD-10-CM | POA: Diagnosis not present

## 2022-12-12 MED ORDER — TAMSULOSIN HCL 0.4 MG PO CAPS
0.4000 mg | ORAL_CAPSULE | Freq: Every day | ORAL | 3 refills | Status: AC
  Filled 2022-12-12: qty 90, 90d supply, fill #0
  Filled 2023-03-18: qty 90, 90d supply, fill #1

## 2022-12-14 ENCOUNTER — Other Ambulatory Visit: Payer: Self-pay

## 2022-12-16 ENCOUNTER — Other Ambulatory Visit: Payer: Self-pay | Admitting: Internal Medicine

## 2022-12-16 DIAGNOSIS — R0602 Shortness of breath: Secondary | ICD-10-CM

## 2022-12-17 ENCOUNTER — Ambulatory Visit
Admission: RE | Admit: 2022-12-17 | Discharge: 2022-12-17 | Disposition: A | Discharge status: Home / Self Care | Payer: Medicare HMO | Source: Ambulatory Visit | Attending: Internal Medicine | Admitting: Internal Medicine

## 2022-12-17 ENCOUNTER — Encounter: Payer: Medicare HMO | Admitting: Occupational Therapy

## 2022-12-17 DIAGNOSIS — R0602 Shortness of breath: Secondary | ICD-10-CM

## 2022-12-17 DIAGNOSIS — I700 Atherosclerosis of aorta: Secondary | ICD-10-CM | POA: Diagnosis not present

## 2022-12-17 DIAGNOSIS — R918 Other nonspecific abnormal finding of lung field: Secondary | ICD-10-CM | POA: Diagnosis not present

## 2022-12-18 ENCOUNTER — Encounter: Payer: Medicare HMO | Admitting: Occupational Therapy

## 2022-12-24 ENCOUNTER — Encounter: Payer: Medicare HMO | Admitting: Occupational Therapy

## 2022-12-29 ENCOUNTER — Encounter: Payer: Medicare HMO | Admitting: Occupational Therapy

## 2022-12-31 ENCOUNTER — Encounter: Payer: Medicare HMO | Admitting: Occupational Therapy

## 2023-01-05 ENCOUNTER — Encounter: Payer: Medicare HMO | Admitting: Occupational Therapy

## 2023-01-07 ENCOUNTER — Encounter: Payer: Medicare HMO | Admitting: Occupational Therapy

## 2023-01-07 ENCOUNTER — Inpatient Hospital Stay: Payer: Medicare HMO

## 2023-01-08 ENCOUNTER — Other Ambulatory Visit: Payer: Medicare HMO

## 2023-01-08 ENCOUNTER — Other Ambulatory Visit: Payer: Self-pay

## 2023-01-08 ENCOUNTER — Ambulatory Visit: Payer: Medicare HMO

## 2023-01-08 DIAGNOSIS — H2512 Age-related nuclear cataract, left eye: Secondary | ICD-10-CM | POA: Diagnosis not present

## 2023-01-08 DIAGNOSIS — H25042 Posterior subcapsular polar age-related cataract, left eye: Secondary | ICD-10-CM | POA: Diagnosis not present

## 2023-01-08 DIAGNOSIS — H25011 Cortical age-related cataract, right eye: Secondary | ICD-10-CM | POA: Diagnosis not present

## 2023-01-08 DIAGNOSIS — Z961 Presence of intraocular lens: Secondary | ICD-10-CM | POA: Diagnosis not present

## 2023-01-08 DIAGNOSIS — H25012 Cortical age-related cataract, left eye: Secondary | ICD-10-CM | POA: Diagnosis not present

## 2023-01-08 DIAGNOSIS — H2511 Age-related nuclear cataract, right eye: Secondary | ICD-10-CM | POA: Diagnosis not present

## 2023-01-08 MED ORDER — KETOROLAC TROMETHAMINE 0.5 % OP SOLN
1.0000 [drp] | Freq: Four times a day (QID) | OPHTHALMIC | 1 refills | Status: AC
  Filled 2023-01-08: qty 10, 50d supply, fill #0

## 2023-01-08 MED ORDER — GATIFLOXACIN 0.5 % OP SOLN
1.0000 [drp] | Freq: Four times a day (QID) | OPHTHALMIC | 1 refills | Status: AC
  Filled 2023-01-08: qty 2.5, 18d supply, fill #0

## 2023-01-08 MED ORDER — PREDNISOLONE ACETATE 1 % OP SUSP
OPHTHALMIC | 1 refills | Status: AC
  Filled 2023-01-08: qty 5, 22d supply, fill #0

## 2023-01-12 ENCOUNTER — Encounter: Payer: Medicare HMO | Admitting: Occupational Therapy

## 2023-01-12 ENCOUNTER — Ambulatory Visit
Admission: RE | Admit: 2023-01-12 | Discharge: 2023-01-12 | Disposition: A | Discharge status: Home / Self Care | Payer: Medicare HMO | Source: Ambulatory Visit | Attending: Oncology | Admitting: Oncology

## 2023-01-12 ENCOUNTER — Inpatient Hospital Stay: Payer: Medicare HMO | Attending: Oncology

## 2023-01-12 DIAGNOSIS — I6782 Cerebral ischemia: Secondary | ICD-10-CM | POA: Diagnosis not present

## 2023-01-12 DIAGNOSIS — R202 Paresthesia of skin: Secondary | ICD-10-CM | POA: Diagnosis not present

## 2023-01-12 DIAGNOSIS — C155 Malignant neoplasm of lower third of esophagus: Secondary | ICD-10-CM | POA: Diagnosis not present

## 2023-01-12 DIAGNOSIS — Z452 Encounter for adjustment and management of vascular access device: Secondary | ICD-10-CM | POA: Diagnosis not present

## 2023-01-12 DIAGNOSIS — Z7902 Long term (current) use of antithrombotics/antiplatelets: Secondary | ICD-10-CM | POA: Diagnosis not present

## 2023-01-12 DIAGNOSIS — R911 Solitary pulmonary nodule: Secondary | ICD-10-CM | POA: Insufficient documentation

## 2023-01-12 DIAGNOSIS — D509 Iron deficiency anemia, unspecified: Secondary | ICD-10-CM | POA: Insufficient documentation

## 2023-01-12 DIAGNOSIS — R918 Other nonspecific abnormal finding of lung field: Secondary | ICD-10-CM | POA: Diagnosis not present

## 2023-01-12 DIAGNOSIS — Z8501 Personal history of malignant neoplasm of esophagus: Secondary | ICD-10-CM | POA: Diagnosis not present

## 2023-01-12 DIAGNOSIS — Z7982 Long term (current) use of aspirin: Secondary | ICD-10-CM | POA: Insufficient documentation

## 2023-01-12 LAB — CMP (CANCER CENTER ONLY)
ALT: 41 U/L (ref 0–44)
AST: 35 U/L (ref 15–41)
Albumin: 3.4 g/dL — ABNORMAL LOW (ref 3.5–5.0)
Alkaline Phosphatase: 61 U/L (ref 38–126)
Anion gap: 6 (ref 5–15)
BUN: 22 mg/dL (ref 8–23)
CO2: 24 mmol/L (ref 22–32)
Calcium: 8.1 mg/dL — ABNORMAL LOW (ref 8.9–10.3)
Chloride: 108 mmol/L (ref 98–111)
Creatinine: 1.09 mg/dL (ref 0.61–1.24)
GFR, Estimated: >60 mL/min (ref >60)
Glucose, Bld: 233 mg/dL — ABNORMAL HIGH (ref 70–99)
Potassium: 4.1 mmol/L (ref 3.5–5.1)
Sodium: 138 mmol/L (ref 135–145)
Total Bilirubin: 0.5 mg/dL (ref 0.3–1.2)
Total Protein: 6.1 g/dL — ABNORMAL LOW (ref 6.5–8.1)

## 2023-01-12 LAB — CBC WITH DIFFERENTIAL (CANCER CENTER ONLY)
Abs Immature Granulocytes: 0.02 10*3/uL (ref 0.00–0.07)
Basophils Absolute: 0.1 10*3/uL (ref 0.0–0.1)
Basophils Relative: 1 %
Eosinophils Absolute: 0.1 10*3/uL (ref 0.0–0.5)
Eosinophils Relative: 2 %
HCT: 42.4 % (ref 39.0–52.0)
Hemoglobin: 13.4 g/dL (ref 13.0–17.0)
Immature Granulocytes: 0 %
Lymphocytes Relative: 12 %
Lymphs Abs: 0.9 10*3/uL (ref 0.7–4.0)
MCH: 29.8 pg (ref 26.0–34.0)
MCHC: 31.6 g/dL (ref 30.0–36.0)
MCV: 94.2 fL (ref 80.0–100.0)
Monocytes Absolute: 0.4 10*3/uL (ref 0.1–1.0)
Monocytes Relative: 5 %
Neutro Abs: 6.2 10*3/uL (ref 1.7–7.7)
Neutrophils Relative %: 80 %
Platelet Count: 200 10*3/uL (ref 150–400)
RBC: 4.5 MIL/uL (ref 4.22–5.81)
RDW: 13.8 % (ref 11.5–15.5)
WBC Count: 7.7 10*3/uL (ref 4.0–10.5)
nRBC: 0 % (ref 0.0–0.2)

## 2023-01-12 LAB — IRON AND TIBC
Iron: 90 ug/dL (ref 45–182)
Saturation Ratios: 28 % (ref 17.9–39.5)
TIBC: 318 ug/dL (ref 250–450)
UIBC: 228 ug/dL

## 2023-01-12 LAB — VITAMIN D 25 HYDROXY (VIT D DEFICIENCY, FRACTURES): Vit D, 25-Hydroxy: 28.41 ng/mL — ABNORMAL LOW (ref 30–100)

## 2023-01-12 LAB — FERRITIN: Ferritin: 72 ng/mL (ref 24–336)

## 2023-01-12 MED ORDER — IOHEXOL 300 MG/ML  SOLN
100.0000 mL | Freq: Once | INTRAMUSCULAR | Status: AC | PRN
  Administered 2023-01-12: 100 mL via INTRAVENOUS

## 2023-01-12 MED ORDER — SODIUM CHLORIDE 0.9% FLUSH
10.0000 mL | INTRAVENOUS | Status: DC | PRN
  Administered 2023-01-12: 10 mL via INTRAVENOUS
  Filled 2023-01-12: qty 10

## 2023-01-12 MED ORDER — HEPARIN SOD (PORK) LOCK FLUSH 100 UNIT/ML IV SOLN
500.0000 [IU] | Freq: Once | INTRAVENOUS | Status: AC
  Administered 2023-01-12: 500 [IU] via INTRAVENOUS
  Filled 2023-01-12: qty 5

## 2023-01-12 MED ORDER — HEPARIN SOD (PORK) LOCK FLUSH 100 UNIT/ML IV SOLN
500.0000 [IU] | Freq: Once | INTRAVENOUS | Status: DC
  Filled 2023-01-12: qty 5

## 2023-01-12 MED ORDER — HEPARIN SOD (PORK) LOCK FLUSH 100 UNIT/ML IV SOLN
INTRAVENOUS | Status: AC
  Filled 2023-01-12: qty 5

## 2023-01-13 LAB — CEA: CEA: 2.5 ng/mL (ref 0.0–4.7)

## 2023-01-14 ENCOUNTER — Inpatient Hospital Stay: Payer: Medicare HMO | Admitting: Oncology

## 2023-01-14 ENCOUNTER — Encounter: Payer: Medicare HMO | Admitting: Occupational Therapy

## 2023-01-19 ENCOUNTER — Encounter: Payer: Medicare HMO | Admitting: Occupational Therapy

## 2023-01-21 ENCOUNTER — Encounter: Payer: Medicare HMO | Admitting: Occupational Therapy

## 2023-01-27 DIAGNOSIS — I6523 Occlusion and stenosis of bilateral carotid arteries: Secondary | ICD-10-CM | POA: Diagnosis not present

## 2023-01-27 DIAGNOSIS — R001 Bradycardia, unspecified: Secondary | ICD-10-CM | POA: Diagnosis not present

## 2023-01-27 DIAGNOSIS — R55 Syncope and collapse: Secondary | ICD-10-CM | POA: Diagnosis not present

## 2023-01-27 DIAGNOSIS — I10 Essential (primary) hypertension: Secondary | ICD-10-CM | POA: Diagnosis not present

## 2023-01-27 DIAGNOSIS — I480 Paroxysmal atrial fibrillation: Secondary | ICD-10-CM | POA: Diagnosis not present

## 2023-01-27 DIAGNOSIS — E782 Mixed hyperlipidemia: Secondary | ICD-10-CM | POA: Diagnosis not present

## 2023-01-28 ENCOUNTER — Encounter: Payer: Self-pay | Admitting: Oncology

## 2023-01-28 ENCOUNTER — Other Ambulatory Visit: Payer: Self-pay

## 2023-01-28 ENCOUNTER — Inpatient Hospital Stay (HOSPITAL_BASED_OUTPATIENT_CLINIC_OR_DEPARTMENT_OTHER): Payer: Medicare HMO | Admitting: Oncology

## 2023-01-28 VITALS — BP 126/66 | HR 86 | Temp 97.6°F | Resp 18 | Wt 140.0 lb

## 2023-01-28 DIAGNOSIS — C155 Malignant neoplasm of lower third of esophagus: Secondary | ICD-10-CM

## 2023-01-28 DIAGNOSIS — Z7982 Long term (current) use of aspirin: Secondary | ICD-10-CM | POA: Diagnosis not present

## 2023-01-28 DIAGNOSIS — R202 Paresthesia of skin: Secondary | ICD-10-CM | POA: Diagnosis not present

## 2023-01-28 DIAGNOSIS — Z95828 Presence of other vascular implants and grafts: Secondary | ICD-10-CM | POA: Insufficient documentation

## 2023-01-28 DIAGNOSIS — R911 Solitary pulmonary nodule: Secondary | ICD-10-CM | POA: Diagnosis not present

## 2023-01-28 DIAGNOSIS — R918 Other nonspecific abnormal finding of lung field: Secondary | ICD-10-CM | POA: Diagnosis not present

## 2023-01-28 DIAGNOSIS — I6782 Cerebral ischemia: Secondary | ICD-10-CM | POA: Diagnosis not present

## 2023-01-28 DIAGNOSIS — Z452 Encounter for adjustment and management of vascular access device: Secondary | ICD-10-CM | POA: Diagnosis not present

## 2023-01-28 DIAGNOSIS — D509 Iron deficiency anemia, unspecified: Secondary | ICD-10-CM | POA: Diagnosis not present

## 2023-01-28 DIAGNOSIS — E559 Vitamin D deficiency, unspecified: Secondary | ICD-10-CM | POA: Insufficient documentation

## 2023-01-28 DIAGNOSIS — Z8501 Personal history of malignant neoplasm of esophagus: Secondary | ICD-10-CM | POA: Diagnosis not present

## 2023-01-28 DIAGNOSIS — Z7902 Long term (current) use of antithrombotics/antiplatelets: Secondary | ICD-10-CM | POA: Diagnosis not present

## 2023-01-28 MED ORDER — VITAMIN D (ERGOCALCIFEROL) 1.25 MG (50000 UNIT) PO CAPS
50000.0000 [IU] | ORAL_CAPSULE | ORAL | 0 refills | Status: AC
  Filled 2023-01-28: qty 7, 49d supply, fill #0

## 2023-01-28 NOTE — Assessment & Plan Note (Addendum)
#  Esophageal adenocarcinoma, Stage IIB Patient is status post esophagectomy, adjuvant chemotherapy includes 1 cycle of Xeloda/oxaliplatin.  Additional chemotherapy was not completed due to multiple hospitalization due to infection and back surgery. Labs are reviewed and discussed with patient Oct 2024 CT no evidence of recurrence. Continue surveillance.  Obtain CT chest abdomen pelvis in 9 months

## 2023-01-28 NOTE — Assessment & Plan Note (Signed)
He prefers to keep port. Continue port flush eery 6-8 weeks

## 2023-01-28 NOTE — Assessment & Plan Note (Signed)
Stable

## 2023-01-28 NOTE — Progress Notes (Signed)
Hematology/Oncology Progress note [phone] [phone]      CHIEF COMPLAINTS/REASON FOR VISIT:  Follow up  esophageal cancer, IDA, Vitamin Deficiency, Hypocalcemia.   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  Additional chemotherapy was not completed due to multiple hospitalization due to infection and back surgery. Labs are reviewed and discussed with patient Oct 2024 CT no evidence of recurrence. Continue surveillance.  Obtain CT chest abdomen pelvis in 9 months  IDA (iron deficiency anemia) Hb and iron have improved  Continue  oral iron supplementation No need for IV Venofer.   Lung nodules Stable.    Hypocalcemia Continue calcium 1200mg  supplementation.  Vitamin D deficiency Recommend Vitamin D 50,000 units weekly x 7 doses  Port-A-Cath in place He prefers to keep port. Continue port flush eery 6-8 weeks  Orders Placed This Encounter  Procedures   CT CHEST ABDOMEN PELVIS W CONTRAST    Standing Status:   Future    Standing Expiration Date:   01/28/2024    Order Specific Question:   If indicated for the ordered procedure, I authorize the administration of contrast media per Radiology protocol    Answer:   Yes    Order Specific Question:   Does the patient have a contrast media/X-ray dye allergy?    Answer:   No    Order Specific Question:   Preferred imaging location?    Answer:   [facility]    Order Specific Question:   If indicated for the ordered procedure, I authorize the administration of oral contrast media per Radiology  protocol    Answer:   Yes   CBC with Differential (Cancer Center Only)    Standing Status:   Future    Standing Expiration Date:   01/28/2024   CMP (Cancer Center only)    Standing Status:   Future    Standing Expiration Date:   01/28/2024   CEA    Standing Status:   Future    Standing Expiration Date:   01/28/2024   VITAMIN D 25 Hydroxy (Vit-D Deficiency, Fractures)    Standing Status:   Future    Standing Expiration Date:   01/28/2024    Follow-up in 6 months. All questions were answered. The patient knows to call the clinic with any problems, questions or concerns.  Rickard Patience, MD, PhD [facility] Hematology Oncology 01/28/2023    HISTORY OF PRESENTING ILLNESS:   Blake [name] is a  77 y.o.  male presents for follow-up of esophageal cancer Patient has longstanding Barrett's esophagus. Oncology History  Malignant neoplasm of lower third of esophagus (HCC)  07/14/2019 Initial Diagnosis   Patient has longstanding Barrett's esophagus. 03/21/2019 patient underwent surveillance endoscopy which showed esophageal mucosal changes secondary to long segment Barrett's disease present in the lower third of the esophagus.  Maximal longitudinal extent of these mucosal changes were 7 cm in length.  Mucosa was biopsied with cold forceps for histology in 4 quadrants at the interval of 2 cm in the lower third of esophagus.  2 cm hiatal hernia, gastritis Esophageal biopsy 3 out of the 4 biopsies were positive for intramucosal carcinoma involving  Barrett's mucosa with high-grade dysplasia.  1 out of 4 esophagus biopsy showed Barrett's mucosa with erosion, indefinite for dysplasia  06/09/19, EMR at [facility].  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 EMR specimen was received in multiple fragments and margins can not be evaluated.  1 gastric polyps were biopsied which showed fundic gland polyp. Negative for dysplasia   08/08/19, to OR for EGD with sessile tumor from  29-33cm, at least 1.5 cm wide. Pathology with the following: 1) esophagus (30cm) - residual invasive adenocarcinoma, at least invasive to muscularis mucosa and 2) esophagus (30cm) - superficial fragments of highly dysplastic epithelium, focally suspicious for intramucosal carcinoma    09/12/2019 Surgery    At [facility], patient underwent esophagectomy with pathological revealed  Negative esophageal margin, gastric margin, esophagogastrectomy, invasive moderately differentiated esophageal adenocarcinoma involved distal esophageal and GE junction, tumor focally invades the submucosa.  Left gastric lymph node x 4 negative, greater curvature lymph nodes 1/3 is positive for 2mm metastatic carcinoma.  Periesophageal lymph node x 6, negative, final pathology stage pT1b pn1, Esophageal adenocarcinoma, Stage IIB Lymph node positive diease, no neoadjuvant chemotherapy, recommended by  Dr. Rod Mae and was recommended for adjuvant chemotherapy.  Patient prefers to receive treatment closer to home and patient call back to establish care. No radiation was recommended due to the concern of dehiscence. 11/28/2019, patient had a stent removal.    12/09/2019 Cancer Staging   Staging form: Esophagus - Adenocarcinoma, AJCC 8th Edition - Pathologic stage from 12/09/2019: Stage IIB (pT1b, pN1, cM0, G2) - Signed by Rickard Patience, MD on 12/09/2019    11/2019 -  Chemotherapy   September 2021, patient underwent Xeloda 1500 mg twice daily/oxaliplatin.  He reports not tolerating well due to nausea and diarrhea. 12/24/2019 patient's wife was diagnosed with COVID-19 infection and patient was tested negative.  Both patient and wife received monoclonal antibody treatments.  Patient prefers to switch to FOLFOX. He had Mediport placed on 01/12/2020 by Dr. Inez Catalina. Additional chemotherapy was interrupted and delayed due to his aspiration pneumonia, and later due to his preference of proceeding with back surgery first- see below.    01/18/2020 -   Hospital Admission   Patient was advised to go to emergency room and was later admitted for pneumonia treatments.  There was concern of aspiration pneumonia.  Patient was evaluated by speech therapy.  No overt clinical signs of aspiration during p.o. intake.  Cannot rule out possibility of esophageal phase retrograde flow activity patient denies further swallowing evaluation.  Patient was discharged on 01/19/2020.   01/27/2020 Imaging   CT chest with contrast showed extensive nodular airspace process in the right lung with loss of volume and areas of subpleural atelectasis.  Likely polylobar pneumonia with aspiration.  No mediastinal or hilar mass or adenopathy.  Surgical changes   02/24/2020 Surgery   patient had L4-5 lumbar decompression, L3--4 microdiscectomy.  Per note he tolerates procedure very well. djuvant chemotherapy has been delayed significantly due to above reasons. At that point, the benefit is uncertain to resume adjuvant chemotherapy 9 months after original surgery.   04/10/2020 Mammogram   PET scan showed progressive right lung area of dense consolidation and clustering nodularity with correlated hypermetabolic him.  Favor aspiration/chronic infection.  No signs of metastatic disease.  Decision was made to continue observation    06/26/2020 - 06/27/2020 Hospital Admission   admission due to chest pain. Toponin was negative, CXR negative.CT chest angiogram showed np PE, multifocal pneumonia through out right lung, improved.  Increasing moderate left pleural effusion. Unchanged trace right pleural effusion.   07/12/2020 Imaging   CT abdomen pelvis with contrast - no findings of active malignancy. masslike structure in the left upper quadrant represents a confluent region of fat necrosis. Not hypermetabolic on previous PET. Nodular and bandlike opacities at right lung base have improved comparing to 06/26/2020. Substantial narrowing of right lowe lobe tracheobronchial tree. Other chronic  findings.   07/16/2020 Surgery   07/16/2020 s/p spondylolisthesis   12/04/2020 Imaging   CT chest abdomen pelvis showed no evidence of new or progressive findings to suggest recurrent or metastatic disease.  Stable volume loss right hemithorax with diffuse micro nodularity in the right lung. ? Atypical infection.  He declined pulmonology work up    06/29/2021 Imaging   06/29/2021, CT chest abdomen pelvis with contrast was reviewed by me and discussed with patient No evidence of recurrent or metastatic disease.  extensive posterior centrilobular and tree-in-bud nodularity throughout the right lung.  With areas of peripheral consolidation.  Unchanged and is consistent with atypical infection   12/30/2021 Imaging   CT chest abdomen pelvis w contrast 1. Status post esophagectomy and gastric pull-through. No evidence of local recurrence or metastatic disease. 2. Numerous solid nodules are seen throughout the right hemithorax,not significantly changed when compared with prior exam and likely due to chronic atypical infection. 3. No evidence of metastatic disease in the abdomen or pelvis. 4. Aortic Atherosclerosis (ICD10-I70.0) and Emphysema (ICD10-J43.9    04/26/2022 Imaging   MRI brain without contrast 1. 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 2. 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 Koleen Nimrod.  His initial PET scan 11/08/2004 showed abnormal localization in a right neck mass possibly a lymph node, slightly asymmetric localization at the base of the tongue.  Pathology is not available in current EMR.  admitted from 04/25/2022 to 04/26/2022. 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    Currently patient is taking Plavix and aspirin 81 mg.  He has some residual left finger paresthesia.   INTERVAL HISTORY BRODI TOREN is a 77 y.o. male who has above history reviewed by me today presents for follow up visit for management of esophageal cancer.  Manageable GERD symptoms.  Denies any unintentional weight loss, abdominal pain, nausea vomiting.  Review of Systems  Constitutional:  Negative for appetite change, chills, diaphoresis, fatigue, fever and unexpected weight change.  HENT:   Negative for hearing loss, lump/mass, nosebleeds, sore throat and voice change.   Eyes:  Negative for eye problems and icterus.  Respiratory:  Negative for chest tightness, cough, hemoptysis, shortness of breath and wheezing.   Cardiovascular:  Negative for chest pain and leg swelling.  Gastrointestinal:  Negative for abdominal distention, abdominal pain, blood in stool, diarrhea, nausea and rectal pain.  Endocrine: Negative for hot flashes.  Genitourinary:  Negative for bladder incontinence, difficulty urinating, dysuria, frequency, hematuria and nocturia.   Musculoskeletal:  Negative for arthralgias, back pain, flank pain, gait problem and myalgias.  Skin:  Negative for itching and rash.  Neurological:  Negative for dizziness, gait problem, headaches, light-headedness, numbness and seizures.  Hematological:  Negative for adenopathy. Does not bruise/bleed easily.  Psychiatric/Behavioral:  Negative for confusion and decreased concentration. The patient is not nervous/anxious.  MEDICAL HISTORY:  Past Medical History:  Diagnosis Date   Actinic keratosis    Arthritis    Back pain    with leg pain   Basal cell carcinoma 09/04/2009   R cheek 5.5 cm ant to earlobe - 12/26/2009 excision   Basal cell carcinoma 09/04/2009   R ant nasal alar rim   Basal cell carcinoma 07/31/2014   R mid brow   Basal cell carcinoma 07/30/2016   R ant nasal alar rim at ant edge of BCC scar   Basal cell  carcinoma 11/02/2019   L zygoma    Basal cell carcinoma 07/22/2021   left sideburn area, [facility]   Basal cell carcinoma 01/02/2022   Right posterior shoulder, [facility]   Basal cell carcinoma 01/02/2022   R posterior shoulder, [facility]   Basal cell carcinoma 01/02/2022   Right Supraclavicular Base of Neck, [facility]   Cancer of base of tongue (HCC) 2006   s/p chemoradiation   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, bilateral    Dyspnea    Dysrhythmia    Esophageal cancer (HCC) 2021   GERD (gastroesophageal reflux disease)    Hyperlipidemia    Hypertension    Numbness and tingling of both lower extremities    with positioning   Paroxysmal atrial fibrillation (HCC)    Port-A-Cath in place 2006   Prostate enlargement    Squamous cell carcinoma of skin 12/14/2019   L pretibial - ED&C    T2DM (type 2 diabetes mellitus) (HCC)     SURGICAL HISTORY: Past Surgical History:  Procedure Laterality Date   ABDOMINAL SURGERY     CAROTID PTA/STENT INTERVENTION Right 07/04/2021   Procedure: CAROTID PTA/STENT INTERVENTION;  Surgeon: Annice Needy, MD;  Location: [facility]IVE [facility];  Service: Cardiovascular;  Laterality: Right;   COLONOSCOPY  12/08/2003   COLONOSCOPY WITH PROPOFOL N/A 03/21/2019   Procedure: COLONOSCOPY WITH PROPOFOL;  Surgeon: Toledo, Boykin Nearing, MD;  Location: [facility] ENDOSCOPY;  Service: Gastroenterology;  Laterality: N/A;   ESOPHAGOGASTRODUODENOSCOPY (EGD) WITH PROPOFOL N/A 03/21/2019   Procedure: ESOPHAGOGASTRODUODENOSCOPY (EGD) WITH PROPOFOL;  Surgeon: Toledo, Boykin Nearing, MD;  Location: [facility] ENDOSCOPY;  Service: Gastroenterology;  Laterality: N/A;   esophogeal cancer     JOINT REPLACEMENT Right    total hip   LUMBAR LAMINECTOMY/DECOMPRESSION MICRODISCECTOMY N/A 03/03/2018   Procedure: LUMBAR LAMINECTOMY/DECOMPRESSION MICRODISCECTOMY 1 LEVEL-L3-4,L4-5;  Surgeon: Venetia Night, MD;  Location: [facility] ORS;  Service: Neurosurgery;  Laterality: N/A;   LUMBAR LAMINECTOMY/DECOMPRESSION  MICRODISCECTOMY N/A 02/24/2020   Procedure: LEFT L3-4 MICRODISCECTOMY, L4-5 DECOMPRESSION;  Surgeon: Venetia Night, MD;  Location: [facility] ORS;  Service: Neurosurgery;  Laterality: N/A;   MAXIMUM ACCESS (MAS) TRANSFORAMINAL LUMBAR INTERBODY FUSION (TLIF) 1 LEVEL N/A 07/13/2020   Procedure: OPEN L3-4 TRANSFORAMINAL LUMBAR INTERBODY FUSION (TLIF);  Surgeon: Venetia Night, MD;  Location: [facility] ORS;  Service: Neurosurgery;  Laterality: N/A;   PARTIAL HIP ARTHROPLASTY Right 2014   PORT-A-CATH REMOVAL     PORTACATH PLACEMENT Left 01/12/2020   Procedure: INSERTION PORT-A-CATH, with ultrasound fluoroscopy;  Surgeon: Hulda Marin, MD;  Location: [facility] ORS;  Service: General;  Laterality: Left;   TONGUE BIOPSY  2006   TONSILLECTOMY     TRIGGER FINGER RELEASE Right 2013   UPPER GI ENDOSCOPY  01/25/14   multiple gastric polyps    SOCIAL HISTORY: Social History   Socioeconomic History   Marital status: Widowed    Spouse name: Not on file   Number of children: 1   Years of education: Not on file  Highest education level: Not on file  Occupational History   Not on file  Tobacco Use   Smoking status: Former    Current packs/day: 0.00    Average packs/day: 1 pack/day for 30.0 years (30.0 ttl pk-yrs)    Types: Cigarettes    Start date: 02/19/1964    Quit date: 02/18/1994    Years since quitting: 28.9   Smokeless tobacco: Never  Vaping Use   Vaping status: Never Used  Substance and Sexual Activity   Alcohol use: Not Currently   Drug use: No   Sexual activity: Yes  Other Topics Concern   Not on file  Social History Narrative   Lives by himself. Friends in the area. Son Thayer Ohm lives in [place].    Social Determinants of Health   Financial Resource Strain: Low Risk  (10/23/2022)   Received [facility]   Overall Financial Resource Strain (CARDIA)    Difficulty of Paying Living Expenses: Not hard at all  Food Insecurity: No Food Insecurity (10/23/2022)   Received  from [facility]   Hunger Vital Sign    Worried About Running Out of Food in the Last Year: Never true    Ran Out of Food in the Last Year: Never true  Transportation Needs: No Transportation Needs (10/23/2022)   [facility] - [facility]    In the past 12 months, has lack of transportation kept you from medical appointments or from getting medications?: No    Lack of Transportation (Non-Medical): No  Physical Activity: Not on file  Stress: Not on file  Social Connections: Not on file  Intimate Partner Violence: Not on file    FAMILY HISTORY: Family History  Problem Relation Age of Onset   Diabetes Mother    Congestive Heart Failure Mother    Breast cancer Mother    Lung cancer Father     ALLERGIES:  is allergic to sulfa antibiotics.  MEDICATIONS:  Current Outpatient Medications  Medication Sig Dispense Refill   aspirin 81 MG EC tablet Take 1 tablet (81 mg total) by mouth daily at 6 (six) AM. Swallow whole. 30 tablet 11   azelastine (ASTELIN) 0.1 % nasal spray Place 2 sprays into both nostrils 2 (two) times daily. Use in each nostril as directed 30 mL 2   chlorhexidine (PERIDEX) 0.12 % solution Rinse with 15 mLs by Mouth Rinse route 2 (two) times daily and spit out. 473 mL 12   clopidogrel (PLAVIX) 75 MG tablet Take 1 tablet (75 mg total) by mouth daily. 30 tablet 6   cyanocobalamin 1000 MCG tablet Take 1 tablet (1,000 mcg total) by mouth daily. 90 tablet 0   diltiazem (CARDIZEM CD) 180 MG 24 hr capsule Take 1 capsule (180 mg total) by mouth daily. Please hold until you see your cardiologist 90 capsule 3   ferrous sulfate 325 (65 FE) MG tablet Take 1 tablet (325 mg total) by mouth 2 (two) times daily with a meal. 60 tablet 3   gatifloxacin (ZYMAXID) 0.5 % SOLN Place 1 drop into the right eye 4 (four) times daily. Store upside down. 5 mL 1   gatifloxacin (ZYMAXID) 0.5 % SOLN Instill one (1) drop into the left eye four  (4) times a day as directed. Store bottle upside down. 5 mL 1   ketorolac (ACULAR) 0.5 % ophthalmic solution Place 1 drop into the right eye 4 (four) times daily. 10 mL 1   ketorolac (  ACULAR) 0.5 % ophthalmic solution Instill one (1) drop into the left eye four (4) times daily as directed. 10 mL 1   levofloxacin (LEVAQUIN) 500 MG tablet Take 1 tablet (500 mg total) by mouth daily for 10 days. 10 tablet 0   lisinopril (ZESTRIL) 10 MG tablet Take 1 tablet (10 mg total) by mouth daily. 30 tablet 3   lisinopril (ZESTRIL) 10 MG tablet Take 1 tablet by mouth daily.     metoprolol succinate (TOPROL-XL) 100 MG 24 hr tablet Take 2 tablets (200 mg total) by mouth daily. 180 tablet 1   mirtazapine (REMERON) 15 MG tablet Take 1 tablet (15 mg total) by mouth Nightly. 30 tablet 11   omeprazole (PRILOSEC) 40 MG capsule Take 1 capsule (40 mg total) by mouth once daily 90 capsule 1   omeprazole (PRILOSEC) 40 MG capsule Take by mouth.     prednisoLONE acetate (PRED FORTE) 1 % ophthalmic suspension Place 1 drop into the right eye 4 (four) times daily. Please shake before each use. 5 mL 1   prednisoLONE acetate (PRED FORTE) 1 % ophthalmic suspension Instill one (1) drop into left eye four (4) times a day as directed. Shake well before use. 5 mL 1   predniSONE (DELTASONE) 10 MG tablet Take 6 tablets by mouth on day 1, then decrease by one tablet each day 21 tablet 0   pseudoephedrine-guaifenesin (MUCINEX D) 60-600 MG 12 hr tablet Take 1 tablet by mouth every 12 (twelve) hours for 5 days 10 tablet 0   rosuvastatin (CRESTOR) 10 MG tablet Take 1 tablet (10 mg total) by mouth once daily 90 tablet 3   tamsulosin (FLOMAX) 0.4 MG CAPS capsule TAKE 1 CAPSULE BY MOUTH ONCE DAILY. TAKE 30 MINUTES AFTER SAME MEAL EACH DAY. 90 capsule 3   Tiotropium Bromide Monohydrate (SPIRIVA RESPIMAT) 2.5 MCG/ACT AERS Inhale 2 puffs into the lungs daily. 4 g 1   traMADol (ULTRAM) 50 MG tablet Take 1 tablet (50 mg total) by mouth every 6 (six)  hours as needed for pain for up to 7 days. 28 tablet 0   Vitamin D, Ergocalciferol, (DRISDOL) 1.25 MG (50000 UNIT) CAPS capsule Take 1 capsule (50,000 Units total) by mouth every 7 (seven) days. 7 capsule 0   amoxicillin (AMOXIL) 500 MG capsule Take 1 capsule (500 mg total) by mouth 4 (four) times daily until all are taken beginning the morning of surgery (Patient not taking: Reported on 01/28/2023) 40 capsule 0   erythromycin ophthalmic ointment Place 1 Application into the right eye 3 (three) times daily for 10 days. (Patient not taking: Reported on 08/20/2022) 3.5 g 0   ibuprofen (ADVIL) 800 MG tablet Take 1 tablet (800 mg total) by mouth every 8 (eight) hours as needed for pain. (Patient not taking: Reported on 08/20/2022) 60 tablet 1   methocarbamol (ROBAXIN) 500 MG tablet Take 1 tablet (500 mg total) by mouth every 6 (six) hours as needed. (Patient not taking: Reported on 08/20/2022) 90 tablet 1   No current facility-administered medications for this visit.     PHYSICAL EXAMINATION: ECOG PERFORMANCE STATUS: 1 - Symptomatic but completely ambulatory Vitals:   01/28/23 1334  BP: 126/66  Pulse: 86  Resp: 18  Temp: 97.6 F (36.4 C)  SpO2: 99%   Filed Weights   01/28/23 1334  Weight: 140 lb (63.5 kg)    Physical Exam Constitutional:      General: He is not in acute distress. HENT:     Head: Normocephalic and  atraumatic.  Eyes:     General: No scleral icterus. Cardiovascular:     Rate and Rhythm: Normal rate and regular rhythm.     Heart sounds: Normal heart sounds.  Pulmonary:     Effort: Pulmonary effort is normal. No respiratory distress.     Breath sounds: No wheezing.  Abdominal:     General: Bowel sounds are normal. There is no distension.     Palpations: Abdomen is soft.  Musculoskeletal:        General: No deformity. Normal range of motion.     Cervical back: Normal range of motion and neck supple.  Skin:    General: Skin is warm and dry.     Findings: No erythema  or rash.  Neurological:     Mental Status: He is alert and oriented to person, place, and time. Mental status is at baseline.     Cranial Nerves: No cranial nerve deficit.  Psychiatric:        Mood and Affect: Mood normal.     LABORATORY DATA:  I have reviewed the data as listed    Latest Ref Rng & Units 01/12/2023    1:05 PM 07/03/2022   12:05 PM 06/02/2022    1:02 PM  CBC  WBC 4.0 - 10.5 K/uL 7.7  8.0  8.3   Hemoglobin 13.0 - 17.0 g/dL 19.1  47.8  29.5   Hematocrit 39.0 - 52.0 % 42.4  44.4  40.5   Platelets 150 - 400 K/uL 200  214  259       Latest Ref Rng & Units 01/12/2023    1:05 PM 07/03/2022   12:05 PM 06/02/2022    1:02 PM  CMP  Glucose 70 - 99 mg/dL 621  308  657   BUN 8 - 23 mg/dL 22  14  19    Creatinine 0.61 - 1.24 mg/dL 8.46  9.62  9.52   Sodium 135 - 145 mmol/L 138  136  139   Potassium 3.5 - 5.1 mmol/L 4.1  4.1  4.5   Chloride 98 - 111 mmol/L 108  104  108   CO2 22 - 32 mmol/L 24  24  26    Calcium 8.9 - 10.3 mg/dL 8.1  8.4  8.4   Total Protein 6.5 - 8.1 g/dL 6.1  6.6  6.6   Total Bilirubin 0.3 - 1.2 mg/dL 0.5  0.6  0.5   Alkaline Phos 38 - 126 U/L 61  76  70   AST 15 - 41 U/L 35  43  26   ALT 0 - 44 U/L 41  38  23      Iron/TIBC/Ferritin/ %Sat    Component Value Date/Time   IRON 90 01/12/2023 1305   TIBC 318 01/12/2023 1305   FERRITIN 72 01/12/2023 1305   IRONPCTSAT 28 01/12/2023 1305      RADIOGRAPHIC STUDIES: I have personally reviewed the radiological images as listed and agreed with the findings in the report. CT CHEST ABDOMEN PELVIS W CONTRAST  Result Date: 01/21/2023 CLINICAL DATA:  History of esophageal carcinoma. Assess treatment response. * Tracking Code: BO * EXAM: CT CHEST, ABDOMEN, AND PELVIS WITH CONTRAST TECHNIQUE: Multidetector CT imaging of the chest, abdomen and pelvis was performed following the standard protocol during bolus administration of intravenous contrast. RADIATION DOSE REDUCTION: This exam was performed according to the  departmental dose-optimization program which includes automated exposure control, adjustment of the mA and/or kV according to patient size and/or use of iterative  reconstruction technique. CONTRAST:  OMNIPAQUE IOHEXOL 300 MG/ML  SOLN COMPARISON:  07/03/2022 FINDINGS: CT CHEST FINDINGS Cardiovascular: No acute cardiovascular findings. No pulmonary embolism. Mediastinum/Nodes: Post esophagectomy and gastric pull up. No evidence of local recurrence within the gastric pull up or remaining esophagus. No mediastinal adenopathy. No axillary supraclavicular adenopathy. Lungs/Pleura: Volume loss in the RIGHT hemithorax. Peripheral consolidation in the lateral RIGHT lung is similar measuring 29 mm x 9 mm (image 49/series 6). Multiple pulmonary nodules present within the RIGHT lower lobe grossly unchanged. Example 5 mm nodule on image 78/6 compares to 6 mm on prior. Nodules range from 2 mm to 15 mm are too numerous to count. One larger nodules measures 14 mm on image 58/6 compared to 15 mm. LEFT lung is clear. Musculoskeletal: No aggressive osseous lesion. CT ABDOMEN AND PELVIS FINDINGS Hepatobiliary: No focal hepatic lesion. No biliary ductal dilatation. Gallbladder is normal. Common bile duct is normal. Pancreas: Pancreas is normal. No ductal dilatation. No pancreatic inflammation. Spleen: Normal spleen Adrenals/urinary tract: Adrenal glands and kidneys are normal. The ureters and bladder normal. Stomach/Bowel: Post gastric pull up. The duodenum and small bowel normal. Appendix and cecum normal. The colon and rectosigmoid colon are normal. Vascular/Lymphatic: Abdominal aorta is normal caliber with atherosclerotic calcification. There is no retroperitoneal or periportal lymphadenopathy. No pelvic lymphadenopathy. Reproductive: Prostate unremarkable Other: Bilobed fluid collection in the LEFT upper quadrant measuring 6 cm is unchanged from prior (image 50/2) Musculoskeletal: Posterior lumbar fusion. No aggressive osseous  lesion Degenerative osteophytosis of the spine. IMPRESSION: CHEST: 1. Post esophagectomy and gastric pull up. No evidence of local recurrence. 2. Stable peripheral consolidation in the RIGHT lung. 3. Stable multiple pulmonary nodules in the RIGHT lower lobe. 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 PELVIS: 1. No evidence of metastatic disease in the abdomen pelvis. 2. Stable bilobed fluid collection in the LEFT upper quadrant. Benign postsurgical finding. Electronically Signed   By: Genevive Bi M.D.   On: 01/21/2023 12:14   CT CHEST WO CONTRAST  Result Date: 01/04/2023 CLINICAL DATA:  Shortness of breath. 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 EXAM: CT CHEST WITHOUT CONTRAST TECHNIQUE: Multidetector CT imaging of the chest was performed following the standard protocol without IV contrast. RADIATION DOSE REDUCTION: This exam was performed according to the departmental dose-optimization program which includes automated exposure control, adjustment of the mA and/or kV according to patient size and/or use of iterative reconstruction technique. COMPARISON:  07/03/2022, 12/30/2021 and 12/04/2020 FINDINGS: Cardiovascular: Left subclavian Port-A-Cath has tip in the SVC. Heart is normal size. Calcification of the aortic root. Minimal calcified plaque over the left anterior descending coronary artery with suggestion of a stent over the LAD artery. Thoracic aorta is normal in caliber. There is calcified plaque over the descending thoracic aorta. Remaining vascular structures are unremarkable. Mediastinum/Nodes: No mediastinal or hilar adenopathy. Evidence of patient's previous esophagectomy with gastric pull-through procedure unchanged. Remaining mediastinal structures are unremarkable. Lungs/Pleura: Lungs are adequately inflated as the left lung is clear. Volume loss of the right lung which is stable. Numerous patchy nodular opacities  throughout the right lung which are unchanged with the exception of slight worsening nodularity over the posterior basilar segment of the right lower lobe with largest nodule measuring 1.1 cm in this area. This new nodularity is nonspecific and may be due to progression of chronic underlying disease versus superimposed acute infection or metastatic disease. Stable focal pleural nodularity over the posterolateral right mid thorax. No effusion.  Upper Abdomen: No acute findings. Calcified plaque over the abdominal aorta. Suggestion of subtle cholelithiasis. 2-3 mm stone over the upper pole right kidney. Musculoskeletal: Moderate degenerative changes of the spine, otherwise unchanged. IMPRESSION: 1. Numerous patchy nodular opacities throughout the right lung which are unchanged with the exception of slight worsening nodularity over the posterior basilar segment of the right lower lobe with largest nodule measuring 1.1 cm in this area. This new nodularity is nonspecific and may be due to progression of chronic underlying disease versus superimposed acute infection or metastatic disease. 2. Stable focal pleural nodularity over the posterolateral right mid thorax. 3. Evidence of patient's previous esophagectomy with gastric pull-through procedure unchanged. 4. Aortic atherosclerosis. Atherosclerotic coronary artery disease. 5. Suggestion of subtle cholelithiasis. 6. 2-3 mm stone over the upper pole right kidney. Aortic Atherosclerosis (ICD10-I70.0). Electronically Signed   By: Elberta Fortis M.D.   On: 01/04/2023 10:55

## 2023-01-28 NOTE — Assessment & Plan Note (Signed)
Recommend Vitamin D 50,000 units weekly x 7 doses

## 2023-01-28 NOTE — Assessment & Plan Note (Signed)
Continue calcium 1200mg  supplementation.

## 2023-01-28 NOTE — Assessment & Plan Note (Signed)
Hb and iron have improved  Continue  oral iron supplementation No need for IV Venofer.

## 2023-01-29 DIAGNOSIS — H25012 Cortical age-related cataract, left eye: Secondary | ICD-10-CM | POA: Diagnosis not present

## 2023-01-29 DIAGNOSIS — H2512 Age-related nuclear cataract, left eye: Secondary | ICD-10-CM | POA: Diagnosis not present

## 2023-01-29 DIAGNOSIS — H25011 Cortical age-related cataract, right eye: Secondary | ICD-10-CM | POA: Diagnosis not present

## 2023-01-29 DIAGNOSIS — H25042 Posterior subcapsular polar age-related cataract, left eye: Secondary | ICD-10-CM | POA: Diagnosis not present

## 2023-01-29 DIAGNOSIS — Z961 Presence of intraocular lens: Secondary | ICD-10-CM | POA: Diagnosis not present

## 2023-03-02 DIAGNOSIS — E119 Type 2 diabetes mellitus without complications: Secondary | ICD-10-CM | POA: Diagnosis not present

## 2023-03-02 DIAGNOSIS — Z0000 Encounter for general adult medical examination without abnormal findings: Secondary | ICD-10-CM | POA: Diagnosis not present

## 2023-03-02 DIAGNOSIS — E782 Mixed hyperlipidemia: Secondary | ICD-10-CM | POA: Diagnosis not present

## 2023-03-02 DIAGNOSIS — I10 Essential (primary) hypertension: Secondary | ICD-10-CM | POA: Diagnosis not present

## 2023-03-02 DIAGNOSIS — Z125 Encounter for screening for malignant neoplasm of prostate: Secondary | ICD-10-CM | POA: Diagnosis not present

## 2023-03-02 DIAGNOSIS — I480 Paroxysmal atrial fibrillation: Secondary | ICD-10-CM | POA: Diagnosis not present

## 2023-03-02 DIAGNOSIS — I2510 Atherosclerotic heart disease of native coronary artery without angina pectoris: Secondary | ICD-10-CM | POA: Diagnosis not present

## 2023-03-02 DIAGNOSIS — Z79899 Other long term (current) drug therapy: Secondary | ICD-10-CM | POA: Diagnosis not present

## 2023-03-03 ENCOUNTER — Encounter (INDEPENDENT_AMBULATORY_CARE_PROVIDER_SITE_OTHER): Payer: Self-pay | Admitting: Vascular Surgery

## 2023-03-03 ENCOUNTER — Ambulatory Visit (INDEPENDENT_AMBULATORY_CARE_PROVIDER_SITE_OTHER): Payer: Medicare HMO

## 2023-03-03 ENCOUNTER — Ambulatory Visit (INDEPENDENT_AMBULATORY_CARE_PROVIDER_SITE_OTHER): Payer: Medicare HMO | Admitting: Vascular Surgery

## 2023-03-03 ENCOUNTER — Other Ambulatory Visit: Payer: Self-pay

## 2023-03-03 VITALS — BP 131/65 | HR 71 | Resp 16 | Wt 142.6 lb

## 2023-03-03 DIAGNOSIS — E785 Hyperlipidemia, unspecified: Secondary | ICD-10-CM

## 2023-03-03 DIAGNOSIS — I6523 Occlusion and stenosis of bilateral carotid arteries: Secondary | ICD-10-CM

## 2023-03-03 DIAGNOSIS — I10 Essential (primary) hypertension: Secondary | ICD-10-CM

## 2023-03-03 DIAGNOSIS — C029 Malignant neoplasm of tongue, unspecified: Secondary | ICD-10-CM | POA: Diagnosis not present

## 2023-03-03 DIAGNOSIS — E119 Type 2 diabetes mellitus without complications: Secondary | ICD-10-CM | POA: Diagnosis not present

## 2023-03-03 MED ORDER — LISINOPRIL 10 MG PO TABS
10.0000 mg | ORAL_TABLET | Freq: Every day | ORAL | 3 refills | Status: DC
  Filled 2023-03-03: qty 30, 30d supply, fill #0
  Filled 2023-03-30: qty 30, 30d supply, fill #1
  Filled 2023-05-01: qty 30, 30d supply, fill #2
  Filled 2023-06-12: qty 30, 30d supply, fill #3

## 2023-03-03 NOTE — Assessment & Plan Note (Signed)
Carotid duplex today shows 1 to 39% velocities and a patent right carotid artery stent which have improved over time.  His left carotid artery remains on the lower end of the 40 to 59% stenosis range.  Continue dual antiplatelet therapy and statin agent.  I will continue to follow him on 19-month intervals given his previous history of radiation making recurrent stenosis and quicker progression of disease more likely.

## 2023-03-03 NOTE — Assessment & Plan Note (Signed)
History of radiation making him more likely to have recurrent carotid stenosis

## 2023-03-03 NOTE — Progress Notes (Signed)
MRN : [id number]  Blake [name] is a 77 y.o. ([birth year]) male who presents with chief complaint of  Chief Complaint  Patient presents with   Follow-up    3-4 month carotid follow up  .  History of Present Illness: Patient returns in follow-up of his carotid disease.  He is doing well and denies any focal neurologic symptoms. Specifically, the patient denies amaurosis fugax, speech or swallowing difficulties, or arm or leg weakness or numbness. He had right carotid stent placement over a year and a half ago for high-grade stenosis.  He has had previous radiation therapy to his neck.  Carotid duplex today shows 1 to 39% velocities and a patent right carotid artery stent which have improved over time.  His left carotid artery remains on the lower end of the 40 to 59% stenosis range.  Current Outpatient Medications  Medication Sig Dispense Refill   aspirin 81 MG EC tablet Take 1 tablet (81 mg total) by mouth daily at 6 (six) AM. Swallow whole. 30 tablet 11   azelastine (ASTELIN) 0.1 % nasal spray Place 2 sprays into both nostrils 2 (two) times daily. Use in each nostril as directed 30 mL 2   chlorhexidine (PERIDEX) 0.12 % solution Rinse with 15 mLs by Mouth Rinse route 2 (two) times daily and spit out. 473 mL 12   clopidogrel (PLAVIX) 75 MG tablet Take 1 tablet (75 mg total) by mouth daily. 30 tablet 6   cyanocobalamin 1000 MCG tablet Take 1 tablet (1,000 mcg total) by mouth daily. 90 tablet 0   diltiazem (CARDIZEM CD) 180 MG 24 hr capsule Take 1 capsule (180 mg total) by mouth daily. Please hold until you see your cardiologist 90 capsule 3   ferrous sulfate 325 (65 FE) MG tablet Take 1 tablet (325 mg total) by mouth 2 (two) times daily with a meal. 60 tablet 3   gatifloxacin (ZYMAXID) 0.5 % SOLN Place 1 drop into the right eye 4 (four) times daily. Store upside down. 5 mL 1   gatifloxacin (ZYMAXID) 0.5 % SOLN Instill one (1) drop into the left eye four (4) times a day as directed. Store  bottle upside down. 5 mL 1   ketorolac (ACULAR) 0.5 % ophthalmic solution Place 1 drop into the right eye 4 (four) times daily. 10 mL 1   ketorolac (ACULAR) 0.5 % ophthalmic solution Instill one (1) drop into the left eye four (4) times daily as directed. 10 mL 1   levofloxacin (LEVAQUIN) 500 MG tablet Take 1 tablet (500 mg total) by mouth daily for 10 days. 10 tablet 0   lisinopril (ZESTRIL) 10 MG tablet Take 1 tablet by mouth daily.     lisinopril (ZESTRIL) 10 MG tablet Take 1 tablet (10 mg total) by mouth daily. 30 tablet 3   metoprolol succinate (TOPROL-XL) 100 MG 24 hr tablet Take 2 tablets (200 mg total) by mouth daily. 180 tablet 1   mirtazapine (REMERON) 15 MG tablet Take 1 tablet (15 mg total) by mouth Nightly. 30 tablet 11   omeprazole (PRILOSEC) 40 MG capsule Take 1 capsule (40 mg total) by mouth once daily 90 capsule 1   omeprazole (PRILOSEC) 40 MG capsule Take by mouth.     prednisoLONE acetate (PRED FORTE) 1 % ophthalmic suspension Place 1 drop into the right eye 4 (four) times daily. Please shake before each use. 5 mL 1   prednisoLONE acetate (PRED FORTE) 1 % ophthalmic suspension Instill one (1) drop into left  eye four (4) times a day as directed. Shake well before use. 5 mL 1   pseudoephedrine-guaifenesin (MUCINEX D) 60-600 MG 12 hr tablet Take 1 tablet by mouth every 12 (twelve) hours for 5 days 10 tablet 0   rosuvastatin (CRESTOR) 10 MG tablet Take 1 tablet (10 mg total) by mouth once daily 90 tablet 3   tamsulosin (FLOMAX) 0.4 MG CAPS capsule TAKE 1 CAPSULE BY MOUTH ONCE DAILY. TAKE 30 MINUTES AFTER SAME MEAL EACH DAY. 90 capsule 3   Tiotropium Bromide Monohydrate (SPIRIVA RESPIMAT) 2.5 MCG/ACT AERS Inhale 2 puffs into the lungs daily. 4 g 1   traMADol (ULTRAM) 50 MG tablet Take 1 tablet (50 mg total) by mouth every 6 (six) hours as needed for pain for up to 7 days. 28 tablet 0   Vitamin D, Ergocalciferol, (DRISDOL) 1.25 MG (50000 UNIT) CAPS capsule Take 1 capsule (50,000 Units  total) by mouth every 7 (seven) days. 7 capsule 0   amoxicillin (AMOXIL) 500 MG capsule Take 1 capsule (500 mg total) by mouth 4 (four) times daily until all are taken beginning the morning of surgery (Patient not taking: Reported on 01/28/2023) 40 capsule 0   erythromycin ophthalmic ointment Place 1 Application into the right eye 3 (three) times daily for 10 days. (Patient not taking: Reported on 08/20/2022) 3.5 g 0   ibuprofen (ADVIL) 800 MG tablet Take 1 tablet (800 mg total) by mouth every 8 (eight) hours as needed for pain. (Patient not taking: Reported on 08/20/2022) 60 tablet 1   methocarbamol (ROBAXIN) 500 MG tablet Take 1 tablet (500 mg total) by mouth every 6 (six) hours as needed. (Patient not taking: Reported on 08/20/2022) 90 tablet 1   No current facility-administered medications for this visit.    Past Medical History:  Diagnosis Date   Actinic keratosis    Arthritis    Back pain    with leg pain   Basal cell carcinoma 09/04/2009   R cheek 5.5 cm ant to earlobe - 12/26/2009 excision   Basal cell carcinoma 09/04/2009   R ant nasal alar rim   Basal cell carcinoma 07/31/2014   R mid brow   Basal cell carcinoma 07/30/2016   R ant nasal alar rim at ant edge of BCC scar   Basal cell carcinoma 11/02/2019   L zygoma    Basal cell carcinoma 07/22/2021   left sideburn area, [facility]   Basal cell carcinoma 01/02/2022   Right posterior shoulder, [facility]   Basal cell carcinoma 01/02/2022   R posterior shoulder, [facility]   Basal cell carcinoma 01/02/2022   Right Supraclavicular Base of Neck, [facility]   Cancer of base of tongue (HCC) 2006   s/p chemoradiation   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, bilateral    Dyspnea    Dysrhythmia    Esophageal cancer (HCC) 2021   GERD (gastroesophageal reflux disease)    Hyperlipidemia    Hypertension    Numbness and tingling of both lower extremities    with positioning   Paroxysmal atrial fibrillation (HCC)    Port-A-Cath in place 2006    Prostate enlargement    Squamous cell carcinoma of skin 12/14/2019   L pretibial - ED&C    T2DM (type 2 diabetes mellitus) (HCC)     Past Surgical History:  Procedure Laterality Date   ABDOMINAL SURGERY     CAROTID PTA/STENT INTERVENTION Right 07/04/2021   Procedure: CAROTID PTA/STENT INTERVENTION;  Surgeon: Annice Needy, MD;  Location: [facility];  Service: Cardiovascular;  Laterality: Right;   COLONOSCOPY  12/08/2003   COLONOSCOPY WITH PROPOFOL N/A 03/21/2019   Procedure: COLONOSCOPY WITH PROPOFOL;  Surgeon: Toledo, Boykin Nearing, MD;  Location: [facility];  Service: Gastroenterology;  Laterality: N/A;   ESOPHAGOGASTRODUODENOSCOPY (EGD) WITH PROPOFOL N/A 03/21/2019   Procedure: ESOPHAGOGASTRODUODENOSCOPY (EGD) WITH PROPOFOL;  Surgeon: Toledo, Boykin Nearing, MD;  Location: [facility];  Service: Gastroenterology;  Laterality: N/A;   esophogeal cancer     JOINT REPLACEMENT Right    total hip   LUMBAR LAMINECTOMY/DECOMPRESSION MICRODISCECTOMY N/A 03/03/2018   Procedure: LUMBAR LAMINECTOMY/DECOMPRESSION MICRODISCECTOMY 1 LEVEL-L3-4,L4-5;  Surgeon: Venetia Night, MD;  Location: [facility];  Service: Neurosurgery;  Laterality: N/A;   LUMBAR LAMINECTOMY/DECOMPRESSION MICRODISCECTOMY N/A 02/24/2020   Procedure: LEFT L3-4 MICRODISCECTOMY, L4-5 DECOMPRESSION;  Surgeon: Venetia Night, MD;  Location: [facility];  Service: Neurosurgery;  Laterality: N/A;   MAXIMUM ACCESS (MAS) TRANSFORAMINAL LUMBAR INTERBODY FUSION (TLIF) 1 LEVEL N/A 07/13/2020   Procedure: OPEN L3-4 TRANSFORAMINAL LUMBAR INTERBODY FUSION (TLIF);  Surgeon: Venetia Night, MD;  Location: [facility];  Service: Neurosurgery;  Laterality: N/A;   PARTIAL HIP ARTHROPLASTY Right 2014   PORT-A-CATH REMOVAL     PORTACATH PLACEMENT Left 01/12/2020   Procedure: INSERTION PORT-A-CATH, with ultrasound fluoroscopy;  Surgeon: Hulda Marin, MD;  Location: [facility];  Service: General;  Laterality: Left;   TONGUE BIOPSY  2006    TONSILLECTOMY     TRIGGER FINGER RELEASE Right 2013   UPPER GI ENDOSCOPY  01/25/14   multiple gastric polyps     Social History   Tobacco Use   Smoking status: Former    Current packs/day: 0.00    Average packs/day: 1 pack/day for 30.0 years (30.0 ttl pk-yrs)    Types: Cigarettes    Start date: 02/19/1964    Quit date: 02/18/1994    Years since quitting: 29.0   Smokeless tobacco: Never  Vaping Use   Vaping status: Never Used  Substance Use Topics   Alcohol use: Not Currently   Drug use: No       Family History  Problem Relation Age of Onset   Diabetes Mother    Congestive Heart Failure Mother    Breast cancer Mother    Lung cancer Father      Allergies  Allergen Reactions   Sulfa Antibiotics Rash     REVIEW OF SYSTEMS (Negative unless checked)   Constitutional: [] Weight loss  [] Fever  [] Chills Cardiac: [] Chest pain   [] Chest pressure   [x] Palpitations   [] Shortness of breath when laying flat   [] Shortness of breath at rest   [] Shortness of breath with exertion. Vascular:  [] Pain in legs with walking   [] Pain in legs at rest   [] Pain in legs when laying flat   [] Claudication   [] Pain in feet when walking  [] Pain in feet at rest  [] Pain in feet when laying flat   [] History of DVT   [] Phlebitis   [] Swelling in legs   [] Varicose veins   [] Non-healing ulcers Pulmonary:   [] Uses home oxygen   [] Productive cough   [] Hemoptysis   [] Wheeze  [] COPD   [] Asthma Neurologic:  [] Dizziness  [x] Blackouts   [] Seizures   [x] History of stroke   [] History of TIA  [] Aphasia   [] Temporary blindness   [] Dysphagia   [] Weakness or numbness in arms   [] Weakness or numbness in legs Musculoskeletal:  [x] Arthritis   [] Joint swelling   [] Joint pain   [x] Low back pain Hematologic:  [] Easy bruising  [] Easy bleeding   []   Hypercoagulable state   [] Anemic  [] Hepatitis Gastrointestinal:  [] Blood in stool   [] Vomiting blood  [x] Gastroesophageal reflux/heartburn   [] Difficulty swallowing. Genitourinary:   [] Chronic kidney disease   [] Difficult urination  [x] Frequent urination  [] Burning with urination   [] Blood in urine Skin:  [] Rashes   [] Ulcers   [] Wounds Psychological:  [] History of anxiety   []  History of major depression.   Physical Examination  Vitals:   03/03/23 1403  BP: 131/65  Pulse: 71  Resp: 16  Weight: 142 lb 9.6 oz (64.7 kg)   Body mass index is 23.73 kg/m. Gen:  WD/WN, NAD. Appears younger than stated age. Head: [place]/AT, No temporalis wasting. Ear/Nose/Throat: Hearing grossly intact, nares w/o erythema or drainage, trachea midline Eyes: Conjunctiva clear. Sclera non-icteric Neck: Supple.  No bruit  Pulmonary:  Good air movement, equal and clear to auscultation bilaterally.  Cardiac: RRR, No JVD Vascular:  Vessel Right Left  Radial Palpable Palpable       Musculoskeletal: M/S 5/5 throughout.  No deformity or atrophy. No edema. Neurologic: CN 2-12 intact. Sensation grossly intact in extremities.  Symmetrical.  Speech is fluent. Motor exam as listed above. Psychiatric: Judgment intact, Mood & affect appropriate for pt's clinical situation. Dermatologic: No rashes or ulcers noted.  No cellulitis or open wounds.    CBC Lab Results  Component Value Date   WBC 7.7 01/12/2023   HGB 13.4 01/12/2023   HCT 42.4 01/12/2023   MCV 94.2 01/12/2023   PLT 200 01/12/2023    BMET    Component Value Date/Time   NA 138 01/12/2023 1305   NA 130 (L) 01/26/2013 0505   K 4.1 01/12/2023 1305   K 4.6 01/26/2013 0505   CL 108 01/12/2023 1305   CL 101 01/26/2013 0505   CO2 24 01/12/2023 1305   CO2 26 01/26/2013 0505   GLUCOSE 233 (H) 01/12/2023 1305   GLUCOSE 138 (H) 01/26/2013 0505   BUN 22 01/12/2023 1305   BUN 19 (H) 01/26/2013 0505   CREATININE 1.09 01/12/2023 1305   CREATININE 1.18 01/26/2013 0505   CALCIUM 8.1 (L) 01/12/2023 1305   CALCIUM 8.3 (L) 01/26/2013 0505   GFRNONAA >60 01/12/2023 1305   GFRNONAA >60 01/26/2013 0505   GFRAA >60 12/28/2019 1328   GFRAA  >60 01/26/2013 0505   CrCl cannot be calculated (Patient's most recent lab result is older than the maximum 21 days allowed.).  COAG Lab Results  Component Value Date   INR 1.0 04/25/2022   INR 1.0 07/04/2020   INR 1.0 02/17/2020    Radiology No results found.   Assessment/Plan Tongue cancer (HCC) History of radiation making him more likely to have recurrent carotid stenosis  Carotid stenosis Carotid duplex today shows 1 to 39% velocities and a patent right carotid artery stent which have improved over time.  His left carotid artery remains on the lower end of the 40 to 59% stenosis range.  Continue dual antiplatelet therapy and statin agent.  I will continue to follow him on 14-month intervals given his previous history of radiation making recurrent stenosis and quicker progression of disease more likely.   Essential (primary) hypertension blood pressure control important in reducing the progression of atherosclerotic disease. On appropriate oral medications.     Diabetes mellitus (HCC) blood glucose control important in reducing the progression of atherosclerotic disease. Also, involved in wound healing. On appropriate medications.     HLD (hyperlipidemia) lipid control important in reducing the progression of atherosclerotic disease. Continue statin therapy  Festus Barren, MD  03/03/2023 3:42 PM    This note was created with Dragon medical transcription system.  Any errors from dictation are purely unintentional

## 2023-03-04 ENCOUNTER — Inpatient Hospital Stay: Payer: Medicare HMO | Attending: Oncology

## 2023-03-04 DIAGNOSIS — Z8501 Personal history of malignant neoplasm of esophagus: Secondary | ICD-10-CM | POA: Insufficient documentation

## 2023-03-04 DIAGNOSIS — Z452 Encounter for adjustment and management of vascular access device: Secondary | ICD-10-CM | POA: Diagnosis not present

## 2023-03-04 DIAGNOSIS — Z95828 Presence of other vascular implants and grafts: Secondary | ICD-10-CM

## 2023-03-04 MED ORDER — HEPARIN SOD (PORK) LOCK FLUSH 100 UNIT/ML IV SOLN
500.0000 [IU] | Freq: Once | INTRAVENOUS | Status: AC
Start: 2023-03-04 — End: 2023-03-04
  Administered 2023-03-04: 500 [IU] via INTRAVENOUS
  Filled 2023-03-04: qty 5

## 2023-03-04 MED ORDER — SODIUM CHLORIDE 0.9% FLUSH
10.0000 mL | Freq: Once | INTRAVENOUS | Status: AC
  Administered 2023-03-04: 10 mL via INTRAVENOUS
  Filled 2023-03-04: qty 10

## 2023-03-06 DIAGNOSIS — Z0100 Encounter for examination of eyes and vision without abnormal findings: Secondary | ICD-10-CM | POA: Diagnosis not present

## 2023-03-18 ENCOUNTER — Other Ambulatory Visit: Payer: Self-pay

## 2023-03-18 MED ORDER — METOPROLOL SUCCINATE ER 100 MG PO TB24
200.0000 mg | ORAL_TABLET | Freq: Every day | ORAL | 1 refills | Status: DC
  Filled 2023-03-18: qty 180, 90d supply, fill #0
  Filled 2023-06-29: qty 180, 90d supply, fill #1

## 2023-04-02 DIAGNOSIS — Z79899 Other long term (current) drug therapy: Secondary | ICD-10-CM | POA: Diagnosis not present

## 2023-04-02 DIAGNOSIS — E559 Vitamin D deficiency, unspecified: Secondary | ICD-10-CM | POA: Diagnosis not present

## 2023-04-02 DIAGNOSIS — Z125 Encounter for screening for malignant neoplasm of prostate: Secondary | ICD-10-CM | POA: Diagnosis not present

## 2023-04-02 DIAGNOSIS — E118 Type 2 diabetes mellitus with unspecified complications: Secondary | ICD-10-CM | POA: Diagnosis not present

## 2023-04-02 DIAGNOSIS — E782 Mixed hyperlipidemia: Secondary | ICD-10-CM | POA: Diagnosis not present

## 2023-04-14 ENCOUNTER — Other Ambulatory Visit: Payer: Self-pay

## 2023-04-14 MED ORDER — OMEPRAZOLE 40 MG PO CPDR
40.0000 mg | DELAYED_RELEASE_CAPSULE | Freq: Every day | ORAL | 1 refills | Status: DC
  Filled 2023-04-14: qty 90, 90d supply, fill #0
  Filled 2023-06-29 (×2): qty 90, 90d supply, fill #1

## 2023-04-29 ENCOUNTER — Inpatient Hospital Stay: Payer: Medicare HMO | Attending: Oncology

## 2023-04-29 DIAGNOSIS — Z452 Encounter for adjustment and management of vascular access device: Secondary | ICD-10-CM | POA: Diagnosis not present

## 2023-04-29 DIAGNOSIS — R001 Bradycardia, unspecified: Secondary | ICD-10-CM | POA: Diagnosis not present

## 2023-04-29 DIAGNOSIS — I480 Paroxysmal atrial fibrillation: Secondary | ICD-10-CM | POA: Diagnosis not present

## 2023-04-29 DIAGNOSIS — I2510 Atherosclerotic heart disease of native coronary artery without angina pectoris: Secondary | ICD-10-CM | POA: Diagnosis not present

## 2023-04-29 DIAGNOSIS — E782 Mixed hyperlipidemia: Secondary | ICD-10-CM | POA: Diagnosis not present

## 2023-04-29 DIAGNOSIS — R55 Syncope and collapse: Secondary | ICD-10-CM | POA: Diagnosis not present

## 2023-04-29 DIAGNOSIS — I10 Essential (primary) hypertension: Secondary | ICD-10-CM | POA: Diagnosis not present

## 2023-04-29 DIAGNOSIS — R0602 Shortness of breath: Secondary | ICD-10-CM | POA: Diagnosis not present

## 2023-04-29 DIAGNOSIS — Z95828 Presence of other vascular implants and grafts: Secondary | ICD-10-CM

## 2023-04-29 DIAGNOSIS — Z8501 Personal history of malignant neoplasm of esophagus: Secondary | ICD-10-CM | POA: Diagnosis not present

## 2023-04-29 DIAGNOSIS — I6340 Cerebral infarction due to embolism of unspecified cerebral artery: Secondary | ICD-10-CM | POA: Diagnosis not present

## 2023-04-29 DIAGNOSIS — J438 Other emphysema: Secondary | ICD-10-CM | POA: Diagnosis not present

## 2023-04-29 MED ORDER — SODIUM CHLORIDE 0.9% FLUSH
10.0000 mL | Freq: Once | INTRAVENOUS | Status: AC
  Administered 2023-04-29: 10 mL via INTRAVENOUS
  Filled 2023-04-29: qty 10

## 2023-04-29 MED ORDER — HEPARIN SOD (PORK) LOCK FLUSH 100 UNIT/ML IV SOLN
500.0000 [IU] | Freq: Once | INTRAVENOUS | Status: AC
  Administered 2023-04-29: 500 [IU] via INTRAVENOUS
  Filled 2023-04-29: qty 5

## 2023-05-04 ENCOUNTER — Other Ambulatory Visit: Payer: Self-pay

## 2023-05-31 ENCOUNTER — Other Ambulatory Visit: Payer: Self-pay

## 2023-05-31 MED ORDER — ROSUVASTATIN CALCIUM 10 MG PO TABS
10.0000 mg | ORAL_TABLET | Freq: Every day | ORAL | 3 refills | Status: AC
  Filled 2023-05-31: qty 90, 90d supply, fill #0
  Filled 2023-08-27: qty 90, 90d supply, fill #1
  Filled 2023-11-30: qty 90, 90d supply, fill #2
  Filled 2024-03-15: qty 90, 90d supply, fill #3

## 2023-06-12 ENCOUNTER — Other Ambulatory Visit: Payer: Self-pay

## 2023-06-12 ENCOUNTER — Other Ambulatory Visit (INDEPENDENT_AMBULATORY_CARE_PROVIDER_SITE_OTHER): Payer: Self-pay | Admitting: Nurse Practitioner

## 2023-06-12 MED ORDER — CLOPIDOGREL BISULFATE 75 MG PO TABS
75.0000 mg | ORAL_TABLET | Freq: Every day | ORAL | 6 refills | Status: DC
  Filled 2023-06-12: qty 30, 30d supply, fill #0
  Filled 2023-07-21: qty 30, 30d supply, fill #1
  Filled 2023-09-30: qty 30, 30d supply, fill #2

## 2023-06-15 ENCOUNTER — Other Ambulatory Visit: Payer: Self-pay

## 2023-06-15 ENCOUNTER — Encounter: Payer: Self-pay | Admitting: Dermatology

## 2023-06-15 ENCOUNTER — Ambulatory Visit (INDEPENDENT_AMBULATORY_CARE_PROVIDER_SITE_OTHER): Admitting: Dermatology

## 2023-06-15 DIAGNOSIS — C44319 Basal cell carcinoma of skin of other parts of face: Secondary | ICD-10-CM | POA: Diagnosis not present

## 2023-06-15 DIAGNOSIS — L720 Epidermal cyst: Secondary | ICD-10-CM

## 2023-06-15 DIAGNOSIS — C4441 Basal cell carcinoma of skin of scalp and neck: Secondary | ICD-10-CM

## 2023-06-15 DIAGNOSIS — D492 Neoplasm of unspecified behavior of bone, soft tissue, and skin: Secondary | ICD-10-CM | POA: Diagnosis not present

## 2023-06-15 MED ORDER — DILTIAZEM HCL ER COATED BEADS 180 MG PO CP24
180.0000 mg | ORAL_CAPSULE | Freq: Every day | ORAL | 1 refills | Status: DC
Start: 2023-06-15 — End: 2023-12-29
  Filled 2023-06-15: qty 90, 90d supply, fill #0
  Filled 2023-09-24: qty 90, 90d supply, fill #1

## 2023-06-15 NOTE — Progress Notes (Signed)
 Follow-Up Visit   Subjective  Blake [name] is a 78 y.o. male who presents for the following: Spots on face. Area on left cheek was Tx with LN2 by Dr. Gwen Pounds 12/10/2022.  Patient states there was a skin cancer on left  side of nose that was treated by Dr. Gwen Pounds years ago. States it has started bleeding recently.   Has a couple of lesions on scalp to check today.   The patient has spots, moles and lesions to be evaluated, some may be new or changing and the patient may have concern these could be cancer.    The following portions of the chart were reviewed this encounter and updated as appropriate: medications, allergies, medical history  Review of Systems:  No other skin or systemic complaints except as noted in HPI or Assessment and Plan.  Objective  Well appearing patient in no apparent distress; mood and affect are within normal limits.  A focused examination was performed of the following areas: Scalp, face   Relevant physical exam findings are noted in the Assessment and Plan.  Left Ala Nasi A. 2 mm pink pearly papule with telangiectasias  Left Mid Cheek B. 11 mm pink pearly plaque with telangiectasias  Left Preauricular Area C. 4 mm pink pearly papule with telangiectasias  Left Postauricular Scalp D. 11 mm pink pearly plaque with telangiectasias   Assessment & Plan   NEOPLASM OF SKIN (4) Left Ala Nasi Skin / nail biopsy Type of biopsy: tangential   Informed consent: discussed and consent obtained   Timeout: patient name, date of birth, surgical site, and procedure verified   Procedure prep:  Patient was prepped and draped in usual sterile fashion Prep type:  Isopropyl alcohol Anesthesia: the lesion was anesthetized in a standard fashion   Anesthetic:  1% lidocaine w/ epinephrine 1-100,000 buffered w/ 8.4% NaHCO3 Instrument used: DermaBlade   Hemostasis achieved with: pressure and aluminum chloride   Outcome: patient tolerated procedure well    Post-procedure details: sterile dressing applied and wound care instructions given   Dressing type: bandage and petrolatum   Specimen 1 - Surgical pathology Differential Diagnosis: R/O BCC  Check Margins: No Left Mid Cheek Skin / nail biopsy Type of biopsy: tangential   Informed consent: discussed and consent obtained   Timeout: patient name, date of birth, surgical site, and procedure verified   Procedure prep:  Patient was prepped and draped in usual sterile fashion Prep type:  Isopropyl alcohol Anesthesia: the lesion was anesthetized in a standard fashion   Anesthetic:  1% lidocaine w/ epinephrine 1-100,000 buffered w/ 8.4% NaHCO3 Instrument used: DermaBlade   Hemostasis achieved with: pressure and aluminum chloride   Outcome: patient tolerated procedure well   Post-procedure details: sterile dressing applied and wound care instructions given   Dressing type: bandage and petrolatum   Specimen 2 - Surgical pathology Differential Diagnosis: R/O BCC  Check Margins: No Left Preauricular Area Skin / nail biopsy Type of biopsy: tangential   Informed consent: discussed and consent obtained   Timeout: patient name, date of birth, surgical site, and procedure verified   Procedure prep:  Patient was prepped and draped in usual sterile fashion Prep type:  Isopropyl alcohol Anesthesia: the lesion was anesthetized in a standard fashion   Anesthetic:  1% lidocaine w/ epinephrine 1-100,000 buffered w/ 8.4% NaHCO3 Instrument used: DermaBlade   Hemostasis achieved with: pressure and aluminum chloride   Outcome: patient tolerated procedure well   Post-procedure details: sterile dressing applied and wound care  instructions given   Dressing type: bandage and petrolatum   Specimen 3 - Surgical pathology Differential Diagnosis: R/O BCC   Check Margins: No Left Postauricular Scalp Skin / nail biopsy Type of biopsy: tangential   Informed consent: discussed and consent obtained   Timeout:  patient name, date of birth, surgical site, and procedure verified   Procedure prep:  Patient was prepped and draped in usual sterile fashion Prep type:  Isopropyl alcohol Anesthesia: the lesion was anesthetized in a standard fashion   Anesthetic:  1% lidocaine w/ epinephrine 1-100,000 buffered w/ 8.4% NaHCO3 Instrument used: DermaBlade   Hemostasis achieved with: pressure and aluminum chloride   Outcome: patient tolerated procedure well   Post-procedure details: sterile dressing applied and wound care instructions given   Dressing type: bandage and petrolatum   Specimen 4 - Surgical pathology Differential Diagnosis: R/O BCC   Check Margins: No  Neoplasm of Skin Additional lesions on face require biopsy. Will have patient RTC for biopsy.   Return for biopsy in 3-4 weeks.  I, Lawson Radar, CMA, am acting as scribe for Elie Goody, MD.   Documentation: I have reviewed the above documentation for accuracy and completeness, and I agree with the above.  Elie Goody, MD

## 2023-06-15 NOTE — Patient Instructions (Signed)

## 2023-06-18 LAB — SURGICAL PATHOLOGY

## 2023-06-22 ENCOUNTER — Telehealth: Payer: Self-pay

## 2023-06-22 DIAGNOSIS — C44319 Basal cell carcinoma of skin of other parts of face: Secondary | ICD-10-CM

## 2023-06-22 NOTE — Telephone Encounter (Signed)
 Called patient. Discussed pathology results and treatment plan. Patient voiced understanding. Prefers [facility], Dr. Caralyn Guile, for Mohs surgery.

## 2023-06-22 NOTE — Telephone Encounter (Signed)
-----   Message from [facility] sent at 06/18/2023  6:19 PM EDT ----- Diagnosis:  1. Skin, left ala nasi :       CONSISTENT WITH SURFACE OF AN EPIDERMOID CYST        2. Skin, left mid cheek :       BASAL CELL CARCINOMA, NODULAR PATTERN        3. Skin, left preauricular area :       BASAL CELL CARCINOMA, NODULAR PATTERN        4. Skin, left postauricular scalp :       BASAL CELL CARCINOMA, NODULAR PATTERN    ##please move up follow up to 1 month from now  FOR 1: biopsy shows a benign cyst. We will recheck at follow up  FOR 2, 3, 4:  Please call with diagnosis and determine where the patient would like to have Mohs surgery.  Explanation: your biopsies show basal cell skin cancers in the second layer of the skin.  Treatment: Given the locations and type of skin cancer, I recommend Mohs surgery. Mohs surgery involves cutting out the skin cancer and then checking under the microscope to ensure the whole skin cancer was removed. If any skin cancer remains, the surgeon will cut out more until it is fully removed. The cure rate is about 98-99%. Once the Mohs surgeon confirms the skin cancer is out, they will discuss the options to repair or heal the area. You must take it easy for about two weeks after surgery (no lifting over 10-15 lbs, avoid activity to get your heart rate and blood pressure up). It is done at another office outside of [place] ([place], [place], or [place]).

## 2023-06-24 ENCOUNTER — Inpatient Hospital Stay: Payer: Medicare HMO | Attending: Oncology

## 2023-06-24 DIAGNOSIS — Z452 Encounter for adjustment and management of vascular access device: Secondary | ICD-10-CM | POA: Insufficient documentation

## 2023-06-24 DIAGNOSIS — Z8501 Personal history of malignant neoplasm of esophagus: Secondary | ICD-10-CM | POA: Insufficient documentation

## 2023-06-24 DIAGNOSIS — Z95828 Presence of other vascular implants and grafts: Secondary | ICD-10-CM

## 2023-06-24 MED ORDER — HEPARIN SOD (PORK) LOCK FLUSH 100 UNIT/ML IV SOLN
500.0000 [IU] | Freq: Once | INTRAVENOUS | Status: AC
Start: 2023-06-24 — End: 2023-06-24
  Administered 2023-06-24: 500 [IU] via INTRAVENOUS
  Filled 2023-06-24: qty 5

## 2023-06-24 MED ORDER — SODIUM CHLORIDE 0.9% FLUSH
10.0000 mL | INTRAVENOUS | Status: DC | PRN
  Administered 2023-06-24: 10 mL via INTRAVENOUS
  Filled 2023-06-24: qty 10

## 2023-06-24 NOTE — Patient Instructions (Signed)

## 2023-06-29 ENCOUNTER — Other Ambulatory Visit: Payer: Self-pay

## 2023-06-30 ENCOUNTER — Encounter: Payer: Self-pay | Admitting: Dermatology

## 2023-07-01 ENCOUNTER — Ambulatory Visit: Admitting: Dermatology

## 2023-07-03 ENCOUNTER — Other Ambulatory Visit: Payer: Self-pay

## 2023-07-06 DIAGNOSIS — I10 Essential (primary) hypertension: Secondary | ICD-10-CM | POA: Diagnosis not present

## 2023-07-06 DIAGNOSIS — E1151 Type 2 diabetes mellitus with diabetic peripheral angiopathy without gangrene: Secondary | ICD-10-CM | POA: Diagnosis not present

## 2023-07-06 DIAGNOSIS — R270 Ataxia, unspecified: Secondary | ICD-10-CM | POA: Diagnosis not present

## 2023-07-06 DIAGNOSIS — Z79899 Other long term (current) drug therapy: Secondary | ICD-10-CM | POA: Diagnosis not present

## 2023-07-06 DIAGNOSIS — I480 Paroxysmal atrial fibrillation: Secondary | ICD-10-CM | POA: Diagnosis not present

## 2023-07-06 DIAGNOSIS — E782 Mixed hyperlipidemia: Secondary | ICD-10-CM | POA: Diagnosis not present

## 2023-07-06 DIAGNOSIS — D509 Iron deficiency anemia, unspecified: Secondary | ICD-10-CM | POA: Diagnosis not present

## 2023-07-07 ENCOUNTER — Other Ambulatory Visit: Payer: Self-pay | Admitting: Internal Medicine

## 2023-07-07 DIAGNOSIS — R270 Ataxia, unspecified: Secondary | ICD-10-CM

## 2023-07-07 DIAGNOSIS — I480 Paroxysmal atrial fibrillation: Secondary | ICD-10-CM

## 2023-07-07 DIAGNOSIS — I10 Essential (primary) hypertension: Secondary | ICD-10-CM

## 2023-07-08 ENCOUNTER — Encounter: Payer: Self-pay | Admitting: Dermatology

## 2023-07-08 DIAGNOSIS — E118 Type 2 diabetes mellitus with unspecified complications: Secondary | ICD-10-CM | POA: Diagnosis not present

## 2023-07-08 DIAGNOSIS — R001 Bradycardia, unspecified: Secondary | ICD-10-CM | POA: Diagnosis not present

## 2023-07-08 DIAGNOSIS — I2510 Atherosclerotic heart disease of native coronary artery without angina pectoris: Secondary | ICD-10-CM | POA: Diagnosis not present

## 2023-07-08 DIAGNOSIS — R0602 Shortness of breath: Secondary | ICD-10-CM | POA: Diagnosis not present

## 2023-07-08 DIAGNOSIS — I160 Hypertensive urgency: Secondary | ICD-10-CM | POA: Diagnosis not present

## 2023-07-08 DIAGNOSIS — E782 Mixed hyperlipidemia: Secondary | ICD-10-CM | POA: Diagnosis not present

## 2023-07-08 DIAGNOSIS — I480 Paroxysmal atrial fibrillation: Secondary | ICD-10-CM | POA: Diagnosis not present

## 2023-07-08 DIAGNOSIS — R55 Syncope and collapse: Secondary | ICD-10-CM | POA: Diagnosis not present

## 2023-07-08 DIAGNOSIS — I10 Essential (primary) hypertension: Secondary | ICD-10-CM | POA: Diagnosis not present

## 2023-07-14 ENCOUNTER — Encounter: Payer: Self-pay | Admitting: Dermatology

## 2023-07-14 ENCOUNTER — Ambulatory Visit (INDEPENDENT_AMBULATORY_CARE_PROVIDER_SITE_OTHER): Admitting: Dermatology

## 2023-07-14 VITALS — BP 110/69 | HR 64 | Temp 98.1°F

## 2023-07-14 DIAGNOSIS — C4491 Basal cell carcinoma of skin, unspecified: Secondary | ICD-10-CM | POA: Insufficient documentation

## 2023-07-14 DIAGNOSIS — L579 Skin changes due to chronic exposure to nonionizing radiation, unspecified: Secondary | ICD-10-CM | POA: Diagnosis not present

## 2023-07-14 DIAGNOSIS — C44319 Basal cell carcinoma of skin of other parts of face: Secondary | ICD-10-CM

## 2023-07-14 DIAGNOSIS — L814 Other melanin hyperpigmentation: Secondary | ICD-10-CM

## 2023-07-14 NOTE — Patient Instructions (Signed)

## 2023-07-14 NOTE — Progress Notes (Addendum)
 Follow-Up Visit   Subjective  Blake [name] is a 78 y.o. male who presents for the following: Mohs of a Nodular Basal Cell Carcinoma on the left mid cheek, biopsied by Dr. Felipe Horton.  The following portions of the chart were reviewed this encounter and updated as appropriate: medications, allergies, medical history  Review of Systems:  No other skin or systemic complaints except as noted in HPI or Assessment and Plan.  Objective  Well appearing patient in no apparent distress; mood and affect are within normal limits.  A focused examination was performed of the following areas: Left mid cheek Relevant physical exam findings are noted in the Assessment and Plan.   left mid cheek Pink pearly papule or plaque with arborizing vessels.    Assessment & Plan   BASAL CELL CARCINOMA (BCC), UNSPECIFIED SITE left mid cheek Mohs surgery  Consent obtained: written  Anticoagulation: Is the patient taking prescription anticoagulant and/or aspirin  prescribed/recommended by a physician? Yes   Was the anticoagulation regimen changed prior to Mohs? No    Anesthesia: Anesthesia method: local infiltration Local anesthetic: lidocaine  1% WITH epi  Procedure Details: Timeout: pre-procedure verification complete Procedure Prep: patient was prepped and draped in usual sterile fashion Prep type: chlorhexidine  Biopsy accession number: [id number] Biopsy lab: [facility] Frozen section biopsy performed: No   Specimen debulked: No   Pre-Op diagnosis: basal cell carcinoma BCC subtype: nodular MohsAIQ Surgical site (if tumor spans multiple areas, please select predominant area): cheek (including jawline) Surgery side: midline Surgical site (from skin exam): left mid cheek Pre-operative length (cm): 1.6 Pre-operative width (cm): 1.2 Indications for Mohs surgery: anatomic location where tissue conservation is critical Previously treated? No    Micrographic Surgery Details: Post-operative length (cm):  3 Post-operative width (cm): 2.3 Number of Mohs stages: 1 Is this a complex case (associate members only): No    Stage 1    Tumor features identified on Mohs section: no tumor identified    Depth of defect after stage: subcutaneous fat    Perineural invasion: no perineural invasion  Patient tolerance of procedure: tolerated well, no immediate complications  Reconstruction: Was the defect reconstructed? Yes   Was reconstruction performed by the same Mohs surgeon? Yes   Setting of reconstruction: outpatient office When was reconstruction performed? same day Type of reconstruction: linear Linear reconstruction: complex Length of linear repair (cm): 8  Opioids: Did the patient receive a prescription for opioid/narcotic related to Mohs surgery?: No    Antibiotics: Does patient meet AHA guidelines for endocarditis?: No   Does patient meet AHA guidelines for orthopedic prophylaxis?: No   Were antibiotics given on the day of surgery?: No   Did surgery breach mucosa, expose cartilage/bone, involve an area of lymphedema/inflamed/infected tissue? No    Skin repair Complexity:  Complex Final length (cm):  7.5 Informed consent: discussed and consent obtained   Timeout: patient name, date of birth, surgical site, and procedure verified   Procedure prep:  Patient was prepped and draped in usual sterile fashion Prep type:  Chlorhexidine  Anesthesia: the lesion was anesthetized in a standard fashion   Anesthetic:  1% lidocaine  w/ epinephrine  1-100,000 buffered w/ 8.4% NaHCO3 Reason for type of repair: reduce tension to allow closure and preserve normal anatomy   Undermining: edges undermined   Subcutaneous layers (deep stitches):  Suture size:  5-0 Suture type: Monocryl (poliglecaprone 25)   Stitches:  Buried vertical mattress Fine/surface layer approximation (top stitches):  Suture size:  6-0 Suture type: fast-absorbing plain gut  Stitches: simple running   Hemostasis achieved with:  suture, pressure and electrodesiccation Outcome: patient tolerated procedure well with no complications   Post-procedure details: sterile dressing applied and wound care instructions given   Dressing type: bandage, pressure dressing and petrolatum     Return in about 4 weeks (around 08/11/2023) for Follow up and next Mohs procedure.   08/11/2023  HISTORY OF PRESENT ILLNESS  Blake [name] is seen in consultation at the request of Dr. Felipe Horton for biopsy-proven Basal Cell Carcinoma on the left cheek. They note that the area has been present for about 6 months increasing in size with time.  There is no history of previous treatment.  Reports no other new or changing lesions and has no other complaints today.  Medications and allergies: see patient chart.  Review of systems: Reviewed 8 systems and notable for the above skin cancer.  All other systems reviewed are unremarkable/negative, unless noted in the HPI. Past medical history, surgical history, family history, social history were also reviewed and are noted in the chart/questionnaire.    PHYSICAL EXAMINATION  General: Well-appearing, in no acute distress, alert and oriented x 4. Vitals reviewed in chart (if available).   Skin: Exam reveals a 1.6 x 1.2 cm erythematous papule and biopsy scar on the left cheek. There are rhytids, telangiectasias, and lentigines, consistent with photodamage.   Biopsy report(s) reviewed, confirming the diagnosis.   ASSESSMENT  1) Nodular Basal Cell Carcinoma on the left cheek 2) photodamage 3) solar lentigines   PLAN   1. Due to location, size, histology, or recurrence and the likelihood of subclinical extension as well as the need to conserve normal surrounding tissue, the patient was deemed acceptable for Mohs micrographic surgery (MMS).  The nature and purpose of the procedure, associated benefits and risks including recurrence and scarring, possible complications such as pain, infection, and bleeding,  and alternative methods of treatment if appropriate were discussed with the patient during consent. The lesion location was verified by the patient, by reviewing previous notes, pathology reports, and by photographs as well as angulation measurements if available.  Informed consent was reviewed and signed by the patient, and timeout was performed at 10:00 AM. See op note below.  2. For the photodamage and solar lentigines, sun protection discussed/information given on OTC sunscreens, and we recommend continued regular follow-up with primary dermatologist every 6 months or sooner for any growing, bleeding, or changing lesions. 3. Prognosis and future surveillance discussed. 4. Letter with treatment outcome sent to referring provider. 5. Pain acetaminophen /ibuprofen    MOHS MICROGRAPHIC SURGERY AND RECONSTRUCTION  Initial size:   1.6 x 1.2 cm Surgical defect/wound size: 3.0 x 2.3 cm Anesthesia:    0.33% lidocaine  with 1:200,000 epinephrine  EBL:    <5 mL Complications:  None Repair type:   Complex SQ suture:   5-0 Monocryl Cutaneous suture:  6-0 Plain gut Final size of the repair: 7.5 cm  Stages: 1  STAGE I: Anesthesia achieved with 0.5% lidocaine  with 1:200,000 epinephrine . ChloraPrep applied. 1 section(s) excised using Mohs technique (this includes total peripheral and deep tissue margin excision and evaluation with frozen sections, excised and interpreted by the same physician). The tumor was first debulked and then excised with an approx. 2mm margin.  Hemostasis was achieved with electrocautery as needed.  The specimen was then oriented, subdivided/relaxed, inked, and processed using Mohs technique.    Frozen section analysis revealed a clear deep and peripheral margin.  Reconstruction  The surgical wound was then cleaned, prepped,  and re-anesthetized as above. Wound edges were undermined extensively along at least one entire edge and at a distance equal to or greater than the width of the  defect (see wound defect size above) in order to achieve closure and decrease wound tension and anatomic distortion. Redundant tissue repair including standing cone removal was performed. Hemostasis was achieved with electrocautery. Subcutaneous and epidermal tissues were approximated with the above sutures. The surgical site was then lightly scrubbed with sterile, saline-soaked gauze. Steri-strips were applied, and the area was then bandaged using Vaseline ointment, non-adherent gauze, gauze pads, and tape to provide an adequate pressure dressing. The patient tolerated the procedure well, was given detailed written and verbal wound care instructions, and was discharged in good condition.   The patient will follow-up: 4 weeks.   Documentation: I have reviewed the above documentation for accuracy and completeness, and I agree with the above.  Deneise Finlay, MD

## 2023-07-16 ENCOUNTER — Other Ambulatory Visit: Payer: Self-pay

## 2023-07-16 ENCOUNTER — Ambulatory Visit
Admission: RE | Admit: 2023-07-16 | Discharge: 2023-07-16 | Disposition: A | Discharge status: Home / Self Care | Source: Ambulatory Visit | Attending: Internal Medicine | Admitting: Internal Medicine

## 2023-07-16 DIAGNOSIS — G9389 Other specified disorders of brain: Secondary | ICD-10-CM | POA: Diagnosis not present

## 2023-07-16 DIAGNOSIS — I6782 Cerebral ischemia: Secondary | ICD-10-CM | POA: Diagnosis not present

## 2023-07-16 DIAGNOSIS — R270 Ataxia, unspecified: Secondary | ICD-10-CM | POA: Diagnosis not present

## 2023-07-16 DIAGNOSIS — I10 Essential (primary) hypertension: Secondary | ICD-10-CM | POA: Diagnosis not present

## 2023-07-16 DIAGNOSIS — I480 Paroxysmal atrial fibrillation: Secondary | ICD-10-CM | POA: Diagnosis not present

## 2023-07-16 MED ORDER — TAMSULOSIN HCL 0.4 MG PO CAPS
0.4000 mg | ORAL_CAPSULE | Freq: Every day | ORAL | 3 refills | Status: AC
  Filled 2023-07-16: qty 90, 90d supply, fill #0
  Filled 2023-10-12: qty 90, 90d supply, fill #1
  Filled 2024-01-15: qty 90, 90d supply, fill #2
  Filled 2024-05-03: qty 90, 90d supply, fill #3

## 2023-07-16 MED ORDER — LISINOPRIL 10 MG PO TABS
10.0000 mg | ORAL_TABLET | Freq: Every day | ORAL | 3 refills | Status: DC
  Filled 2023-07-16: qty 30, 30d supply, fill #0
  Filled 2023-08-14: qty 30, 30d supply, fill #1
  Filled 2023-09-11: qty 30, 30d supply, fill #2
  Filled 2023-10-12: qty 30, 30d supply, fill #3

## 2023-07-21 ENCOUNTER — Encounter: Payer: Self-pay | Admitting: Dermatology

## 2023-07-21 ENCOUNTER — Ambulatory Visit: Admitting: Dermatology

## 2023-07-21 DIAGNOSIS — D492 Neoplasm of unspecified behavior of bone, soft tissue, and skin: Secondary | ICD-10-CM

## 2023-07-21 DIAGNOSIS — C44319 Basal cell carcinoma of skin of other parts of face: Secondary | ICD-10-CM | POA: Diagnosis not present

## 2023-07-21 DIAGNOSIS — D485 Neoplasm of uncertain behavior of skin: Secondary | ICD-10-CM

## 2023-07-21 NOTE — Patient Instructions (Signed)

## 2023-07-21 NOTE — Progress Notes (Signed)
   Follow-Up Visit   Subjective  Blake [name] is a 78 y.o. male who presents for the following: biopsies at face.   The patient has spots, moles and lesions to be evaluated, some may be new or changing and the patient may have concern these could be cancer.   The following portions of the chart were reviewed this encounter and updated as appropriate: medications, allergies, medical history  Review of Systems:  No other skin or systemic complaints except as noted in HPI or Assessment and Plan.  Objective  Well appearing patient in no apparent distress; mood and affect are within normal limits.   A focused examination was performed of the following areas: Face, arms, hands  Relevant exam findings are noted in the Assessment and Plan.  Right Cheek 7 mm pink pearly papule   Assessment & Plan     NEOPLASM OF UNCERTAIN BEHAVIOR OF SKIN Right Cheek Skin / nail biopsy Type of biopsy: tangential   Informed consent: discussed and consent obtained   Timeout: patient name, date of birth, surgical site, and procedure verified   Procedure prep:  Patient was prepped and draped in usual sterile fashion Prep type:  Isopropyl alcohol Anesthesia: the lesion was anesthetized in a standard fashion   Anesthetic:  1% lidocaine  w/ epinephrine  1-100,000 buffered w/ 8.4% NaHCO3 Instrument used: DermaBlade   Hemostasis achieved with: pressure and aluminum chloride   Outcome: patient tolerated procedure well   Post-procedure details: sterile dressing applied and wound care instructions given   Dressing type: bandage and petrolatum   Specimen 1 - Surgical pathology Differential Diagnosis: r/o BCC  Check Margins: No 7 mm pink pearly papule  Return for TBSE, with Dr. Linnell Richardson, as scheduled, HxBCC, HxSCC.  Kerstin Peeling, RMA, am acting as scribe for Harris Liming, MD .   Documentation: I have reviewed the above documentation for accuracy and completeness, and I agree with the  above.  Harris Liming, MD

## 2023-07-23 ENCOUNTER — Encounter: Payer: Self-pay | Admitting: Dermatology

## 2023-07-28 ENCOUNTER — Other Ambulatory Visit: Payer: Self-pay

## 2023-07-28 ENCOUNTER — Encounter: Admitting: Dermatology

## 2023-07-28 MED ORDER — MIRTAZAPINE 15 MG PO TABS
15.0000 mg | ORAL_TABLET | Freq: Every evening | ORAL | 11 refills | Status: AC
  Filled 2023-07-28: qty 30, 30d supply, fill #0
  Filled 2023-08-27: qty 30, 30d supply, fill #1
  Filled 2023-09-24: qty 30, 30d supply, fill #2
  Filled 2023-10-27: qty 30, 30d supply, fill #3
  Filled 2023-11-18 – 2023-11-19 (×2): qty 30, 30d supply, fill #4
  Filled 2023-12-29: qty 30, 30d supply, fill #5
  Filled 2024-01-31: qty 30, 30d supply, fill #6
  Filled 2024-03-01: qty 30, 30d supply, fill #7
  Filled 2024-04-01: qty 30, 30d supply, fill #8
  Filled 2024-04-27: qty 30, 30d supply, fill #9
  Filled ????-??-??: fill #4

## 2023-07-30 LAB — SURGICAL PATHOLOGY

## 2023-08-03 ENCOUNTER — Other Ambulatory Visit: Payer: Self-pay

## 2023-08-03 DIAGNOSIS — C44319 Basal cell carcinoma of skin of other parts of face: Secondary | ICD-10-CM

## 2023-08-03 NOTE — Progress Notes (Signed)
 Referral place for Mohs to Dr. Fain Home.

## 2023-08-05 ENCOUNTER — Encounter: Payer: Self-pay | Admitting: Dermatology

## 2023-08-11 ENCOUNTER — Ambulatory Visit: Admitting: Dermatology

## 2023-08-11 ENCOUNTER — Encounter: Payer: Self-pay | Admitting: Dermatology

## 2023-08-11 VITALS — BP 139/63 | HR 59 | Temp 98.4°F

## 2023-08-11 DIAGNOSIS — L579 Skin changes due to chronic exposure to nonionizing radiation, unspecified: Secondary | ICD-10-CM | POA: Diagnosis not present

## 2023-08-11 DIAGNOSIS — L814 Other melanin hyperpigmentation: Secondary | ICD-10-CM

## 2023-08-11 DIAGNOSIS — C4491 Basal cell carcinoma of skin, unspecified: Secondary | ICD-10-CM

## 2023-08-11 DIAGNOSIS — Z85828 Personal history of other malignant neoplasm of skin: Secondary | ICD-10-CM

## 2023-08-11 DIAGNOSIS — L905 Scar conditions and fibrosis of skin: Secondary | ICD-10-CM

## 2023-08-11 DIAGNOSIS — L570 Actinic keratosis: Secondary | ICD-10-CM | POA: Diagnosis not present

## 2023-08-11 DIAGNOSIS — C44319 Basal cell carcinoma of skin of other parts of face: Secondary | ICD-10-CM | POA: Diagnosis not present

## 2023-08-11 DIAGNOSIS — C4441 Basal cell carcinoma of skin of scalp and neck: Secondary | ICD-10-CM | POA: Diagnosis not present

## 2023-08-11 NOTE — Patient Instructions (Signed)

## 2023-08-11 NOTE — Progress Notes (Signed)
 Follow-Up Visit   Subjective  Blake [name] is a 78 y.o. 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 Felipe Horton.   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   The following portions of the chart were reviewed this encounter and updated as appropriate: medications, allergies, medical history  Review of Systems:  No other skin or systemic complaints except as noted in HPI or Assessment and Plan.  Objective  Well appearing patient in no apparent distress; mood and affect are within normal limits.  A focused examination was performed of the following areas: Left post auricular scalp and left preauricular are Relevant physical exam findings are noted in the Assessment and Plan.   Left Preauricular Area Pink pearly papule or plaque with arborizing vessels.   Left Postauricular scalp Pink pearly papule or plaque with arborizing vessels.   Head - Anterior (Face) Erythematous thin papules/macules with gritty scale.   Assessment & Plan   BASAL CELL CARCINOMA (BCC), UNSPECIFIED SITE (2) Left Preauricular Area Mohs surgery  Consent obtained: written  Anticoagulation: Is the patient taking prescription anticoagulant and/or aspirin  prescribed/recommended by a physician? Yes   Was the anticoagulation regimen changed prior to Mohs? No    Anesthesia: Anesthesia method: local infiltration Local anesthetic: lidocaine  1% WITH epi  Procedure Details: Timeout: pre-procedure verification complete Procedure Prep: patient was prepped and draped in usual sterile fashion Prep type: chlorhexidine  Biopsy accession number: [id number] Biopsy lab: [facility] Frozen section biopsy performed: No   Specimen debulked: No   Pre-Op diagnosis: basal cell  carcinoma BCC subtype: nodular MohsAIQ Surgical site (if tumor spans multiple areas, please select predominant area): cheek (including jawline) Surgery side: left Surgical site (from skin exam): Left Preauricular Area Pre-operative length (cm): 1.2 Pre-operative width (cm): 0.8 Indications for Mohs surgery: anatomic location where tissue conservation is critical Previously treated? No    Micrographic Surgery Details: Post-operative length (cm): 2.2 Post-operative width (cm): 1.5 Number of Mohs stages: 2 Is this a complex case (associate members only): No    Stage 1    Tumor features identified on Mohs section: basal carcinoma    Depth of defect after stage: subcutaneous fat    Perineural invasion: no perineural invasion  Stage 2    Tumor features identified on Mohs section: no tumor identified    Depth of defect after stage: subcutaneous fat    Perineural invasion: no perineural invasion  Patient tolerance of procedure: tolerated well, no immediate complications  Reconstruction: Was the defect reconstructed? Yes   Was reconstruction performed by the same Mohs surgeon? Yes   Setting of reconstruction: outpatient office When was reconstruction performed? same day Type of reconstruction: linear Linear reconstruction: complex Length of linear repair (cm): 6  Opioids: Did the patient receive a prescription for opioid/narcotic related to Mohs surgery?: No    Antibiotics: Does patient meet AHA guidelines for endocarditis?: No   Does patient meet AHA guidelines for orthopedic prophylaxis?: No   Were antibiotics given on the day of surgery?: No   Did surgery breach mucosa, expose cartilage/bone, involve an area of lymphedema/inflamed/infected tissue? No    Skin repair Complexity:  Complex Final length (cm):  6.2 Informed consent: discussed and consent obtained   Timeout: patient name, date of birth, surgical site,  and procedure verified   Procedure prep:  Patient was prepped  and draped in usual sterile fashion Prep type:  Chlorhexidine  Anesthesia: the lesion was anesthetized in a standard fashion   Anesthetic:  1% lidocaine  w/ epinephrine  1-100,000 buffered w/ 8.4% NaHCO3 Reason for type of repair: reduce tension to allow closure, reduce subcutaneous dead space and avoid a hematoma and preserve normal anatomy   Undermining: area extensively undermined   Subcutaneous layers (deep stitches):  Suture size:  3-0 Suture type: Monocryl (poliglecaprone 25)   Stitches:  Buried vertical mattress Fine/surface layer approximation (top stitches):  Suture size:  5-0 Suture type: fast-absorbing plain gut   Stitches: simple running   Hemostasis achieved with: suture, pressure and electrodesiccation Outcome: patient tolerated procedure well with no complications   Post-procedure details: sterile dressing applied and wound care instructions given   Dressing type: bandage, pressure dressing and petrolatum   Left Postauricular scalp Mohs surgery  Consent obtained: written  Anticoagulation: Is the patient taking prescription anticoagulant and/or aspirin  prescribed/recommended by a physician? Yes   Was the anticoagulation regimen changed prior to Mohs? No    Procedure Details: Timeout: pre-procedure verification complete Procedure Prep: patient was prepped and draped in usual sterile fashion Prep type: chlorhexidine  Biopsy accession number: [id number] Biopsy lab: [facility] Frozen section biopsy performed: No   Specimen debulked: No   Pre-Op diagnosis: basal cell carcinoma BCC subtype: nodular MohsAIQ Surgical site (if tumor spans multiple areas, please select predominant area): scalp Surgery side: left Surgical site (from skin exam): Left Postauricular scalp Pre-operative length (cm): 1.1 Pre-operative width (cm): 0.6 Indications for Mohs surgery: anatomic location where tissue conservation is critical Previously treated? No    Micrographic Surgery  Details: Post-operative length (cm): 2.6 Post-operative width (cm): 1.5 Number of Mohs stages: 1 Is this a complex case (associate members only): No    Stage 1    Tumor features identified on Mohs section: no tumor identified    Depth of defect after stage: subcutaneous fat    Perineural invasion: no perineural invasion  Patient tolerance of procedure: tolerated well, no immediate complications  Reconstruction: Was the defect reconstructed? Yes   Was reconstruction performed by the same Mohs surgeon? Yes   Setting of reconstruction: outpatient office When was reconstruction performed? same day Type of reconstruction: linear Linear reconstruction: complex Length of linear repair (cm): 5  Opioids: Did the patient receive a prescription for opioid/narcotic related to Mohs surgery?: No    Antibiotics: Does patient meet AHA guidelines for endocarditis?: No   Does patient meet AHA guidelines for orthopedic prophylaxis?: No   Were antibiotics given on the day of surgery?: No   Did surgery breach mucosa, expose cartilage/bone, involve an area of lymphedema/inflamed/infected tissue? No    Skin repair Complexity:  Complex Final length (cm):  5 Informed consent: discussed and consent obtained   Timeout: patient name, date of birth, surgical site, and procedure verified   Procedure prep:  Patient was prepped and draped in usual sterile fashion Prep type:  Chlorhexidine  Anesthesia: the lesion was anesthetized in a standard fashion   Anesthetic:  1% lidocaine  w/ epinephrine  1-100,000 buffered w/ 8.4% NaHCO3 Reason for type of repair: reduce tension to allow closure and preserve normal anatomy   Undermining: edges undermined   Subcutaneous layers (deep stitches):  Suture size:  3-0 Suture type: Vicryl (polyglactin 910)   Stitches:  Buried vertical mattress Fine/surface layer approximation (top stitches):  Suture size:  5-0 Suture type: fast-absorbing plain gut   Stitches:  simple  running   Hemostasis achieved with: suture, pressure and electrodesiccation Outcome: patient tolerated procedure well with no complications   Post-procedure details: sterile dressing applied and wound care instructions given   Dressing type: bandage, pressure dressing and petrolatum   SCAR   AK (ACTINIC KERATOSIS) Head - Anterior (Face) Destruction of lesion - Head - Anterior (Face) Complexity: simple   Destruction method: cryotherapy   Informed consent: discussed and consent obtained   Timeout:  patient name, date of birth, surgical site, and procedure verified Outcome: patient tolerated procedure well with no complications   Post-procedure details: sterile dressing applied and wound care instructions given   Dressing type: bandage and pressure dressing    Scar s/p Mohs for [facility] on the left cheek, treated on 07/14/2023, repaired with linear closure - Reassured that wound has healed well - Discussed that scars take up to 12 months to mature from the date of surgery - Recommend SPF 30+ to scar daily to prevent purple color - OK to start scar massage at 4-6 weeks post-op - Can consider silicone based products for scar healing  Return in about 2 weeks (around 08/25/2023) for Mohs on right cheek.  Tonnie Frederick, Surg Tech III, am acting as scribe for Deneise Finlay, MD.    08/11/2023  HISTORY OF PRESENT ILLNESS  Blake [name] is seen in consultation at the request of Dr.  for biopsy-proven Nodular Basal Cell Carcinoma on the left pre-auricular cheek and a Nodular Basal Cell Carcinoma on the left post-auricular scalp. They note that the areas have been present for about 6 months increasing in size with time.  There is no history of previous treatment.  Reports no other new or changing lesions and has no other complaints today.  Medications and allergies: see patient chart.  Review of systems: Reviewed 8 systems and notable for the above skin cancer.  All other systems reviewed are  unremarkable/negative, unless noted in the HPI. Past medical history, surgical history, family history, social history were also reviewed and are noted in the chart/questionnaire.    PHYSICAL EXAMINATION  General: Well-appearing, in no acute distress, alert and oriented x 4. Vitals reviewed in chart (if available).   Skin: Exam reveals a 1.2 x 0.8 cm erythematous papule and biopsy scar on the left pre-auricular cheek and a 1.1 x 0.6 cm erythematous papule and biopsy scar on the left post auricular scalp. There are rhytids, telangiectasias, and lentigines, consistent with photodamage.   Biopsy report(s) reviewed, confirming the diagnosis.   ASSESSMENT  1a) Nodular Basal Cell Carcinoma on the left pre-auricular region 1b) Nodular Basal Cell Carcinoma on the left post-auricular scalp 2) photodamage 3) solar lentigines   PLAN   1. Due to location, size, histology, or recurrence and the likelihood of subclinical extension as well as the need to conserve normal surrounding tissue, the patient was deemed acceptable for Mohs micrographic surgery (MMS).  The nature and purpose of the procedure, associated benefits and risks including recurrence and scarring, possible complications such as pain, infection, and bleeding, and alternative methods of treatment if appropriate were discussed with the patient during consent. The lesion location was verified by the patient, by reviewing previous notes, pathology reports, and by photographs as well as angulation measurements if available.  Informed consent was reviewed and signed by the patient, and timeout was performed at 10:00 AM. See op note below.  2. For the photodamage and solar lentigines, sun protection discussed/information given on OTC sunscreens, and we  recommend continued regular follow-up with primary dermatologist every 6 months or sooner for any growing, bleeding, or changing lesions. 3. Prognosis and future surveillance discussed. 4. Letter with  treatment outcome sent to referring provider. 5. Pain acetaminophen /ibuprofen   MOHS MICROGRAPHIC SURGERY AND RECONSTRUCTION 1a) Pre-auricular cheek Initial size:   1.2 x 0.8 cm Surgical defect/wound size: 2.2 x 0.5 cm Anesthesia:    0.33% lidocaine  with 1:200,000 epinephrine  EBL:    <5 mL Complications:  None Repair type:   Complex SQ suture:   4-0 Monocryl Cutaneous suture:  6-0 Plain gut Final size of the repair: 6.2 cm  Stages: 2  STAGE I: Anesthesia achieved with 0.5% lidocaine  with 1:200,000 epinephrine . ChloraPrep applied. 2 section(s) excised using Mohs technique (this includes total peripheral and deep tissue margin excision and evaluation with frozen sections, excised and interpreted by the same physician). The tumor was first debulked and then excised with an approx. 2 mm margin.  Hemostasis was achieved with electrocautery as needed.  The specimen was then oriented, subdivided/relaxed, inked, and processed using Mohs technique.    Frozen section analysis revealed a positive margin for islands of cells with peripheral palisading and a haphazard arrangement of the more central cells in the peripheral and deep margin.    STAGE II: An additional 2 mm margin was excised.  Hemostasis was achieved with electrocautery as needed.  The specimen was then oriented, subdivided/relaxed, inked, and processed using Mohs technique. Evaluation of slides by the Mohs surgeon revealed clear tumor margins.  Reconstruction  The surgical wound was then cleaned, prepped, and re-anesthetized as above. Wound edges were undermined extensively along at least one entire edge and at a distance equal to or greater than the width of the defect (see wound defect size above) in order to achieve closure and decrease wound tension and anatomic distortion. Redundant tissue repair including standing cone removal was performed. Hemostasis was achieved with electrocautery. Subcutaneous and epidermal tissues were  approximated with the above sutures. The surgical site was then lightly scrubbed with sterile, saline-soaked gauze. The area was then bandaged using Vaseline ointment, non-adherent gauze, gauze pads, and tape to provide an adequate pressure dressing. The patient tolerated the procedure well, was given detailed written and verbal wound care instructions, and was discharged in good condition.   The patient will follow-up: 2 weeks.   1b) Post-auricular scalp  Initial size:   1.1 x 0.6 cm Surgical defect/wound size: 2.6 x 1.5 cm Anesthesia:    0.33% lidocaine  with 1:200,000 epinephrine  EBL:    <5 mL Complications:  None Repair type:   Complex SQ suture:   3-0 Vicryl Cutaneous suture:  6-0 Plain gut Final size of the repair: 5.0 cm  Stages: 1  STAGE I: Anesthesia achieved with 0.5% lidocaine  with 1:200,000 epinephrine . ChloraPrep applied. 2 section(s) excised using Mohs technique (this includes total peripheral and deep tissue margin excision and evaluation with frozen sections, excised and interpreted by the same physician). The tumor was first debulked and then excised with an approx. 2mm margin.  Hemostasis was achieved with electrocautery as needed.  The specimen was then oriented, subdivided/relaxed, inked, and processed using Mohs technique.    Frozen section analysis revealed a clear deep and peripheral margin.  Reconstruction  The surgical wound was then cleaned, prepped, and re-anesthetized as above. Wound edges were undermined extensively along at least one entire edge and at a distance equal to or greater than the width of the defect (see wound defect size above) in order to achieve  closure and decrease wound tension and anatomic distortion. Redundant tissue repair including standing cone removal was performed. Hemostasis was achieved with electrocautery. Subcutaneous and epidermal tissues were approximated with the above sutures. The surgical site was then lightly scrubbed with  sterile, saline-soaked gauze. The area was then bandaged using Vaseline ointment, non-adherent gauze, gauze pads, and tape to provide an adequate pressure dressing. The patient tolerated the procedure well, was given detailed written and verbal wound care instructions, and was discharged in good condition.   The patient will follow-up: 2 weeks.   Documentation: I have reviewed the above documentation for accuracy and completeness, and I agree with the above.  Deneise Finlay, MD

## 2023-08-18 ENCOUNTER — Encounter (INDEPENDENT_AMBULATORY_CARE_PROVIDER_SITE_OTHER): Payer: Self-pay

## 2023-08-19 ENCOUNTER — Other Ambulatory Visit: Payer: Self-pay

## 2023-08-19 ENCOUNTER — Encounter: Payer: Self-pay | Admitting: Oncology

## 2023-08-19 ENCOUNTER — Inpatient Hospital Stay: Payer: Medicare HMO | Attending: Oncology

## 2023-08-19 DIAGNOSIS — R55 Syncope and collapse: Secondary | ICD-10-CM | POA: Diagnosis not present

## 2023-08-19 DIAGNOSIS — R0602 Shortness of breath: Secondary | ICD-10-CM | POA: Diagnosis not present

## 2023-08-19 DIAGNOSIS — Z452 Encounter for adjustment and management of vascular access device: Secondary | ICD-10-CM | POA: Diagnosis not present

## 2023-08-19 DIAGNOSIS — Z95828 Presence of other vascular implants and grafts: Secondary | ICD-10-CM

## 2023-08-19 DIAGNOSIS — I6340 Cerebral infarction due to embolism of unspecified cerebral artery: Secondary | ICD-10-CM | POA: Diagnosis not present

## 2023-08-19 DIAGNOSIS — E782 Mixed hyperlipidemia: Secondary | ICD-10-CM | POA: Diagnosis not present

## 2023-08-19 DIAGNOSIS — I160 Hypertensive urgency: Secondary | ICD-10-CM | POA: Diagnosis not present

## 2023-08-19 DIAGNOSIS — Z8501 Personal history of malignant neoplasm of esophagus: Secondary | ICD-10-CM | POA: Diagnosis not present

## 2023-08-19 DIAGNOSIS — E118 Type 2 diabetes mellitus with unspecified complications: Secondary | ICD-10-CM | POA: Diagnosis not present

## 2023-08-19 DIAGNOSIS — R001 Bradycardia, unspecified: Secondary | ICD-10-CM | POA: Diagnosis not present

## 2023-08-19 DIAGNOSIS — I10 Essential (primary) hypertension: Secondary | ICD-10-CM | POA: Diagnosis not present

## 2023-08-19 DIAGNOSIS — I480 Paroxysmal atrial fibrillation: Secondary | ICD-10-CM | POA: Diagnosis not present

## 2023-08-19 MED ORDER — HEPARIN SOD (PORK) LOCK FLUSH 100 UNIT/ML IV SOLN
500.0000 [IU] | Freq: Once | INTRAVENOUS | Status: AC
  Administered 2023-08-19: 500 [IU] via INTRAVENOUS
  Filled 2023-08-19: qty 5

## 2023-08-19 MED ORDER — ELIQUIS 5 MG PO TABS
5.0000 mg | ORAL_TABLET | Freq: Two times a day (BID) | ORAL | 5 refills | Status: AC
  Filled 2023-08-19: qty 60, 30d supply, fill #0
  Filled 2023-10-27: qty 60, 30d supply, fill #1
  Filled 2023-12-29: qty 60, 30d supply, fill #2
  Filled 2024-03-22: qty 60, 30d supply, fill #3

## 2023-08-19 MED ORDER — SODIUM CHLORIDE 0.9% FLUSH
10.0000 mL | Freq: Once | INTRAVENOUS | Status: AC
  Administered 2023-08-19: 10 mL via INTRAVENOUS
  Filled 2023-08-19: qty 10

## 2023-09-01 ENCOUNTER — Ambulatory Visit (INDEPENDENT_AMBULATORY_CARE_PROVIDER_SITE_OTHER): Payer: Medicare HMO

## 2023-09-01 ENCOUNTER — Ambulatory Visit (INDEPENDENT_AMBULATORY_CARE_PROVIDER_SITE_OTHER): Payer: Medicare HMO | Admitting: Vascular Surgery

## 2023-09-01 ENCOUNTER — Encounter (INDEPENDENT_AMBULATORY_CARE_PROVIDER_SITE_OTHER): Payer: Self-pay | Admitting: Vascular Surgery

## 2023-09-01 VITALS — BP 124/71 | HR 72 | Resp 16 | Wt 140.2 lb

## 2023-09-01 DIAGNOSIS — E119 Type 2 diabetes mellitus without complications: Secondary | ICD-10-CM

## 2023-09-01 DIAGNOSIS — C029 Malignant neoplasm of tongue, unspecified: Secondary | ICD-10-CM | POA: Diagnosis not present

## 2023-09-01 DIAGNOSIS — E785 Hyperlipidemia, unspecified: Secondary | ICD-10-CM | POA: Diagnosis not present

## 2023-09-01 DIAGNOSIS — I6523 Occlusion and stenosis of bilateral carotid arteries: Secondary | ICD-10-CM | POA: Diagnosis not present

## 2023-09-01 DIAGNOSIS — I10 Essential (primary) hypertension: Secondary | ICD-10-CM | POA: Diagnosis not present

## 2023-09-01 NOTE — Assessment & Plan Note (Signed)
 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  No role for intervention at this level, but we will continue to follow this on 65-month intervals.  He is no longer on Plavix  but has been switched over to Eliquis  due to his atrial fibrillation.  He continues his statin agent.

## 2023-09-01 NOTE — Progress Notes (Signed)
 MRN : [id number]  Blake [name] is a 78 y.o. (1946/02/27) male who presents with chief complaint of  Chief Complaint  Patient presents with   Follow-up    6 month carotid follow up  .  History of Present Illness: Patient returns today in follow up of his carotid disease.  He is doing well today.  He denies any issues or problems.  No focal neurologic symptoms. Specifically, the patient denies amaurosis fugax, speech or swallowing difficulties, or arm or leg weakness or numbness. 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   Current Outpatient Medications  Medication Sig Dispense Refill   apixaban  (ELIQUIS ) 5 MG TABS tablet Take 1 tablet (5 mg total) by mouth every 12 (twelve) hours. 60 tablet 5   azelastine  (ASTELIN ) 0.1 % nasal spray Place 2 sprays into both nostrils 2 (two) times daily. Use in each nostril as directed 30 mL 2   chlorhexidine  (PERIDEX ) 0.12 % solution Rinse with 15 mLs by Mouth Rinse route 2 (two) times daily and spit out. 473 mL 12   clopidogrel  (PLAVIX ) 75 MG tablet Take 1 tablet (75 mg total) by mouth daily. 30 tablet 6   cyanocobalamin  1000 MCG tablet Take 1 tablet (1,000 mcg total) by mouth daily. 90 tablet 0   diltiazem  (CARDIZEM  CD) 180 MG 24 hr capsule Take 1 capsule (180 mg total) by mouth daily. Please hold until you see your cardiologist 90 capsule 1   ferrous sulfate  325 (65 FE) MG tablet Take 1 tablet (325 mg total) by mouth 2 (two) times daily with a meal. 60 tablet 3   gatifloxacin  (ZYMAXID ) 0.5 % SOLN Place 1 drop into the right eye 4 (four) times daily. Store upside down. 5 mL 1   gatifloxacin  (ZYMAXID ) 0.5 % SOLN Instill one (1) drop into the left eye four (4) times a day as directed. Store bottle upside down. 5 mL 1   ketorolac  (ACULAR ) 0.5 % ophthalmic solution Place 1 drop into the right eye 4 (four) times daily. 10 mL 1   ketorolac  (ACULAR )  0.5 % ophthalmic solution Instill one (1) drop into the left eye four (4) times daily as directed. 10 mL 1   levofloxacin  (LEVAQUIN ) 500 MG tablet Take 1 tablet (500 mg total) by mouth daily for 10 days. 10 tablet 0   lisinopril  (ZESTRIL ) 10 MG tablet Take 1 tablet by mouth daily.     lisinopril  (ZESTRIL ) 10 MG tablet Take 1 tablet (10 mg total) by mouth daily. 30 tablet 3   metoprolol  succinate (TOPROL -XL) 100 MG 24 hr tablet Take 2 tablets (200 mg total) by mouth daily. 180 tablet 1   mirtazapine  (REMERON ) 15 MG tablet Take 1 tablet (15 mg total) by mouth Nightly. 30 tablet 11   omeprazole  (PRILOSEC) 40 MG capsule Take by mouth.     omeprazole  (PRILOSEC) 40 MG capsule Take 1 capsule (40 mg total) by mouth daily. 90 capsule 1   prednisoLONE  acetate (PRED FORTE ) 1 % ophthalmic suspension Place 1 drop into the right eye 4 (four) times daily. Please shake before each use. 5 mL 1   prednisoLONE  acetate (PRED FORTE ) 1 % ophthalmic suspension Instill one (1) drop into left eye four (4) times a day as directed. Shake well before use. 5 mL 1   pseudoephedrine -guaifenesin  (MUCINEX  D) 60-600 MG 12 hr tablet Take 1 tablet by  mouth every 12 (twelve) hours for 5 days 10 tablet 0   rosuvastatin  (CRESTOR ) 10 MG tablet Take 1 tablet (10 mg total) by mouth once daily 90 tablet 3   tamsulosin  (FLOMAX ) 0.4 MG CAPS capsule TAKE 1 CAPSULE BY MOUTH ONCE DAILY. TAKE 30 MINUTES AFTER SAME MEAL EACH DAY. 90 capsule 3   tamsulosin  (FLOMAX ) 0.4 MG CAPS capsule Take 1 capsule (0.4 mg total) by mouth once daily TAKE 30 MINUTES AFTER SAME MEAL EACH DAY 90 capsule 3   Tiotropium Bromide  Monohydrate (SPIRIVA  RESPIMAT) 2.5 MCG/ACT AERS Inhale 2 puffs into the lungs daily. 4 g 1   traMADol  (ULTRAM ) 50 MG tablet Take 1 tablet (50 mg total) by mouth every 6 (six) hours as needed for pain for up to 7 days. 28 tablet 0   Vitamin D , Ergocalciferol , (DRISDOL ) 1.25 MG (50000 UNIT) CAPS capsule Take 1 capsule (50,000 Units total) by mouth  every 7 (seven) days. 7 capsule 0   amoxicillin  (AMOXIL ) 500 MG capsule Take 1 capsule (500 mg total) by mouth 4 (four) times daily until all are taken beginning the morning of surgery (Patient not taking: Reported on 09/01/2023) 40 capsule 0   erythromycin  ophthalmic ointment Place 1 Application into the right eye 3 (three) times daily for 10 days. (Patient not taking: Reported on 09/01/2023) 3.5 g 0   ibuprofen  (ADVIL ) 800 MG tablet Take 1 tablet (800 mg total) by mouth every 8 (eight) hours as needed for pain. (Patient not taking: Reported on 09/01/2023) 60 tablet 1   methocarbamol  (ROBAXIN ) 500 MG tablet Take 1 tablet (500 mg total) by mouth every 6 (six) hours as needed. (Patient not taking: Reported on 09/01/2023) 90 tablet 1   No current facility-administered medications for this visit.    Past Medical History:  Diagnosis Date   Actinic keratosis    Arthritis    Back pain    with leg pain   Basal cell carcinoma 09/04/2009   R cheek 5.5 cm ant to earlobe - 12/26/2009 excision   Basal cell carcinoma 09/04/2009   R ant nasal alar rim   Basal cell carcinoma 07/31/2014   R mid brow   Basal cell carcinoma 07/30/2016   R ant nasal alar rim at ant edge of BCC scar   Basal cell carcinoma 11/02/2019   L zygoma    Basal cell carcinoma 07/22/2021   left sideburn area, [facility]   Basal cell carcinoma 01/02/2022   Right posterior shoulder, [facility]   Basal cell carcinoma 01/02/2022   R posterior shoulder, [facility]   Basal cell carcinoma 01/02/2022   Right Supraclavicular Base of Neck, [facility]   Basal cell carcinoma 06/15/2023   Left mid cheek. Nodular pattern. Mohs with Dr. Fain Home 07/14/23   Basal cell carcinoma 06/15/2023   Left preauricular. Nodular pattern. Mohs with Dr. Fain Home pending.   Basal cell carcinoma 06/15/2023   Left post auricular. Nodular pattern. Mohs with Dr. Fain Home pending.   Basal cell carcinoma 07/21/2023   Right cheek. Nodular BCC. Mohs wiht Dr. Fain Home pending.   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, bilateral    Dyspnea    Dysrhythmia    Esophageal cancer (HCC) 2021   GERD (gastroesophageal reflux disease)    Hyperlipidemia    Hypertension    Numbness and tingling of both lower extremities    with positioning   Paroxysmal atrial fibrillation (HCC)    Port-A-Cath in place  2006   Prostate enlargement    Squamous cell carcinoma of skin 12/14/2019   L pretibial - ED&C    T2DM (type 2 diabetes mellitus) (HCC)     Past Surgical History:  Procedure Laterality Date   ABDOMINAL SURGERY     CAROTID PTA/STENT INTERVENTION Right 07/04/2021   Procedure: CAROTID PTA/STENT INTERVENTION;  Surgeon: Celso College, MD;  Location: [facility];  Service: Cardiovascular;  Laterality: Right;   COLONOSCOPY  12/08/2003   COLONOSCOPY WITH PROPOFOL  N/A 03/21/2019   Procedure: COLONOSCOPY WITH PROPOFOL ;  Surgeon: Toledo, Alphonsus Jeans, MD;  Location: [facility];  Service: Gastroenterology;  Laterality: N/A;   ESOPHAGOGASTRODUODENOSCOPY (EGD) WITH PROPOFOL  N/A 03/21/2019   Procedure: ESOPHAGOGASTRODUODENOSCOPY (EGD) WITH PROPOFOL ;  Surgeon: Toledo, Alphonsus Jeans, MD;  Location: [facility];  Service: Gastroenterology;  Laterality: N/A;   esophogeal cancer     JOINT REPLACEMENT Right    total hip   LUMBAR LAMINECTOMY/DECOMPRESSION MICRODISCECTOMY N/A 03/03/2018   Procedure: LUMBAR LAMINECTOMY/DECOMPRESSION MICRODISCECTOMY 1 LEVEL-L3-4,L4-5;  Surgeon: Jodeen Munch, MD;  Location: [facility];  Service: Neurosurgery;  Laterality: N/A;   LUMBAR LAMINECTOMY/DECOMPRESSION MICRODISCECTOMY N/A 02/24/2020   Procedure: LEFT L3-4 MICRODISCECTOMY, L4-5 DECOMPRESSION;  Surgeon: Jodeen Munch, MD;  Location: [facility];  Service: Neurosurgery;  Laterality: N/A;   MAXIMUM ACCESS (MAS) TRANSFORAMINAL LUMBAR INTERBODY FUSION (TLIF) 1 LEVEL N/A 07/13/2020   Procedure: OPEN L3-4 TRANSFORAMINAL LUMBAR INTERBODY FUSION (TLIF);  Surgeon:  Jodeen Munch, MD;  Location: [facility];  Service: Neurosurgery;  Laterality: N/A;   PARTIAL HIP ARTHROPLASTY Right 2014   PORT-A-CATH REMOVAL     PORTACATH PLACEMENT Left 01/12/2020   Procedure: INSERTION PORT-A-CATH, with ultrasound fluoroscopy;  Surgeon: Petra Brandy, MD;  Location: [facility];  Service: General;  Laterality: Left;   TONGUE BIOPSY  2006   TONSILLECTOMY     TRIGGER FINGER RELEASE Right 2013   UPPER GI ENDOSCOPY  01/25/14   multiple gastric polyps     Social History   Tobacco Use   Smoking status: Former    Current packs/day: 0.00    Average packs/day: 1 pack/day for 30.0 years (30.0 ttl pk-yrs)    Types: Cigarettes    Start date: 02/19/1964    Quit date: 02/18/1994    Years since quitting: 29.5   Smokeless tobacco: Never  Vaping Use   Vaping status: Never Used  Substance Use Topics   Alcohol use: Not Currently   Drug use: No      Family History  Problem Relation Age of Onset   Diabetes Mother    Congestive Heart Failure Mother    Breast cancer Mother    Lung cancer Father      Allergies  Allergen Reactions   Sulfa Antibiotics Rash      REVIEW OF SYSTEMS (Negative unless checked)   Constitutional: [] Weight loss  [] Fever  [] Chills Cardiac: [] Chest pain   [] Chest pressure   [x] Palpitations   [] Shortness of breath when laying flat   [] Shortness of breath at rest   [] Shortness of breath with exertion. Vascular:  [] Pain in legs with walking   [] Pain in legs at rest   [] Pain in legs when laying flat   [] Claudication   [] Pain in feet when walking  [] Pain in feet at rest  [] Pain in feet when laying flat   [] History of DVT   [] Phlebitis   [] Swelling in legs   [] Varicose veins   [] Non-healing ulcers Pulmonary:   [] Uses home oxygen   [] Productive cough   [] Hemoptysis   []   Wheeze  [] COPD   [] Asthma Neurologic:  [] Dizziness  [x] Blackouts   [] Seizures   [x] History of stroke   [] History of TIA  [] Aphasia   [] Temporary blindness   [] Dysphagia   [] Weakness or  numbness in arms   [] Weakness or numbness in legs Musculoskeletal:  [x] Arthritis   [] Joint swelling   [] Joint pain   [x] Low back pain Hematologic:  [] Easy bruising  [] Easy bleeding   [] Hypercoagulable state   [] Anemic  [] Hepatitis Gastrointestinal:  [] Blood in stool   [] Vomiting blood  [x] Gastroesophageal reflux/heartburn   [] Difficulty swallowing. Genitourinary:  [] Chronic kidney disease   [] Difficult urination  [x] Frequent urination  [] Burning with urination   [] Blood in urine Skin:  [] Rashes   [] Ulcers   [] Wounds Psychological:  [] History of anxiety   []  History of major depression.  Physical Examination  BP 124/71   Pulse 72   Resp 16   Wt 140 lb 3.2 oz (63.6 kg)   BMI 23.33 kg/m  Gen:  WD/WN, NAD Head: [place]/AT, No temporalis wasting. Ear/Nose/Throat: Hearing grossly intact, nares w/o erythema or drainage Eyes: Conjunctiva clear. Sclera non-icteric Neck: Supple.  Trachea midline Pulmonary:  Good air movement, no use of accessory muscles.  Cardiac: RRR, no JVD Vascular:  Vessel Right Left  Radial Palpable Palpable           Musculoskeletal: M/S 5/5 throughout.  No deformity or atrophy. No edema. Neurologic: Sensation grossly intact in extremities.  Symmetrical.  Speech is fluent.  Psychiatric: Judgment intact, Mood & affect appropriate for pt's clinical situation. Dermatologic: No rashes or ulcers noted.  No cellulitis or open wounds.      Labs Recent Results (from the past 2160 hours)  Surgical pathology     Status: None   Collection Time: 06/15/23 12:00 AM  Result Value Ref Range   SURGICAL PATHOLOGY      SURGICAL PATHOLOGY [facility] [address] [place], [place] [address] Telephone [phone] or [phone] Fax [phone]  REPORT OF DERMATOPATHOLOGY   Accession #: [id number] Patient Name: FARRELL, PANTALEO Visit # : [id number]  MRN: [id number] Cytotechnologist: Zygmunt Hives, Dermatopathologist, Electronic  Signature DOB/Age 15-Jan-1946 (Age: 40) Gender: M Collected Date: 06/15/2023 Received Date: 06/16/2023  FINAL DIAGNOSIS       1. Skin, left ala nasi :       CONSISTENT WITH SURFACE OF AN EPIDERMOID CYST       2. Skin, left mid cheek :       BASAL CELL CARCINOMA, NODULAR PATTERN       3. Skin, left preauricular area :       BASAL CELL CARCINOMA, NODULAR PATTERN       4. Skin, left postauricular scalp :       BASAL CELL CARCINOMA, NODULAR PATTERN       ELECTRONIC SIGNATURE : Depcik-Smith Md, Natalie, Dermatopathologist, Electronic Signature  MICROSCOPIC DESCRIPTION 1. The specimen is superficial but  there is a cystic lesion at the base lined by a stratified squamous epithelium.I see no appreciable atypia.From these findings the features correlate with the surface of an epidermoid cyst. 2. There are dermal aggregates of atypical basaloid cells which are predominately in large nests. 3. There are dermal aggregates of atypical basaloid cells which are predominately in large nests. 4. There are dermal aggregates of atypical basaloid cells which are predominately in large nests.  CASE COMMENTS STAINS USED IN DIAGNOSIS: H&E H&E H&E H&E    CLINICAL HISTORY  SPECIMEN(S) OBTAINED 1. Skin,  Left Ala Nasi 2. Skin, Left Mid Cheek 3. Skin, Left Preauricular Area 4. Skin, Left Postauricular Scalp  SPECIMEN COMMENTS: 1. 2 mm pink pearly papule with telangiectasias 2. 11 mm pink pearly papule with telangiectasias 3. 4 mm pink pearly papule with telangiectasias 4. 11 mm pink pearly papule with telangiectasias SPECIMEN CLINICAL INFORMATION: 1. Neoplasm of skin, R/O B CC 2. Neoplasm of skin, R/O BCC 3. Neoplasm of skin, R/O BCC 4. Neoplasm of skin, R/O BCC    Gross Description 1. Formalin fixed specimen received:  4 X 4 X 1 MM, TOTO (2 P) (1 B) ( erh ) 2. Formalin fixed specimen received:  7 X 5 X 1 MM, TOTO (3 P) (1 B) ( erh ) 3. Formalin fixed specimen received:  5 X 5  X 1 MM, TOTO (2 P) (1 B) ( erh ) 4. Formalin fixed specimen received:  9 X 8 X 2 MM, TOTO (3 P) (1 B) ( erh )        Report signed out from the following location(s) [place] [address] [place], [place] [address] CLIA #: [id number]  [facility] [address] [place], [place] [address] CLIA #: [id number]   Surgical pathology     Status: None   Collection Time: 07/21/23 12:00 AM  Result Value Ref Range   SURGICAL PATHOLOGY      SURGICAL PATHOLOGY [facility] [address], [address] [place], [place] [address] Telephone [phone] or [phone] Fax [phone]  REPORT OF DERMATOPATHOLOGY   Accession #: [id number] Patient Name: JAKORIAN, MARENGO Visit # : [id number]  MRN: [id number] Cytotechnologist: Threasa Flood., Dermatopathologist, Electronic Signature DOB/Age [birth year] (Age: 42) Gender: M Collected Date: 07/21/2023 Received Date: 07/22/2023  FINAL DIAGNOSIS       1. Skin, right cheek :       BASAL CELL CARCINOMA, NODULAR PATTERN       DATE SIGNED OUT: 07/30/2023 ELECTRONIC SIGNATURE : Dorina Garin, Marvine Slovak., Dermatopathologist, Electronic Signature  MICROSCOPIC DESCRIPTION 1. There are dermal nodular aggregates of atypical basaloid cells with peripheral palisading. This is a nodular basal cell carcinoma.  CASE COMMENTS STAINS USED IN DIAGNOSIS: H&E    CLINICAL HISTORY  SPECIMEN(S) OBTAINED 1. Skin, Right Cheek  SPECIMEN COMMENTS: 1. 7  mm pink pearly papule SPECIMEN CLINICAL INFORMATION: 1. Neoplasm of uncertain behavior of skin, R/O BCC    Gross Description 1. Formalin fixed specimen received:  6 X 5 X 1 MM, TOTO (2 P) (1 B) ( QB )        Report signed out from the following location(s) [place] [address], [place] [address] CLIA #: [id number]  [facility] [address] [place], [place] [address] CLIA #:  [id number]     Radiology No results found.  Assessment/Plan  Bilateral carotid artery stenosis Carotid duplex showed increased velocities in the right carotid artery stent and an area just above the stent that are likely now in the 50 to 75% range.  No role for intervention at this level, but we will continue to follow this on 33-month intervals.  He is no longer on Plavix  but has been switched over to Eliquis  due to his atrial fibrillation.  He continues his statin agent.  Essential (primary) hypertension blood pressure control important in reducing the progression of atherosclerotic disease. On appropriate oral medications.     Diabetes mellitus (HCC) blood glucose control  important in reducing the progression of atherosclerotic disease. Also, involved in wound healing. On appropriate medications.     HLD (hyperlipidemia) lipid control important in reducing the progression of atherosclerotic disease. 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  Any errors from dictation are purely unintentional

## 2023-09-02 ENCOUNTER — Encounter: Payer: Self-pay | Admitting: Dermatology

## 2023-09-07 ENCOUNTER — Encounter: Payer: Self-pay | Admitting: Dermatology

## 2023-09-10 ENCOUNTER — Encounter: Payer: Self-pay | Admitting: Dermatology

## 2023-09-10 ENCOUNTER — Ambulatory Visit (INDEPENDENT_AMBULATORY_CARE_PROVIDER_SITE_OTHER): Admitting: Dermatology

## 2023-09-10 ENCOUNTER — Other Ambulatory Visit: Payer: Self-pay

## 2023-09-10 VITALS — BP 121/62 | HR 87 | Temp 98.3°F

## 2023-09-10 DIAGNOSIS — L219 Seborrheic dermatitis, unspecified: Secondary | ICD-10-CM

## 2023-09-10 DIAGNOSIS — Z85828 Personal history of other malignant neoplasm of skin: Secondary | ICD-10-CM

## 2023-09-10 DIAGNOSIS — L905 Scar conditions and fibrosis of skin: Secondary | ICD-10-CM

## 2023-09-10 DIAGNOSIS — L814 Other melanin hyperpigmentation: Secondary | ICD-10-CM | POA: Diagnosis not present

## 2023-09-10 DIAGNOSIS — C44319 Basal cell carcinoma of skin of other parts of face: Secondary | ICD-10-CM | POA: Diagnosis not present

## 2023-09-10 DIAGNOSIS — L579 Skin changes due to chronic exposure to nonionizing radiation, unspecified: Secondary | ICD-10-CM | POA: Diagnosis not present

## 2023-09-10 DIAGNOSIS — C4491 Basal cell carcinoma of skin, unspecified: Secondary | ICD-10-CM

## 2023-09-10 MED ORDER — FLUOCINONIDE EMULSIFIED BASE 0.05 % EX CREA
TOPICAL_CREAM | CUTANEOUS | 3 refills | Status: AC
  Filled 2023-09-10: qty 180, 90d supply, fill #0

## 2023-09-10 MED ORDER — KETOCONAZOLE 2 % EX CREA
1.0000 | TOPICAL_CREAM | Freq: Two times a day (BID) | CUTANEOUS | 2 refills | Status: AC
  Filled 2023-09-10: qty 30, 15d supply, fill #0

## 2023-09-10 NOTE — Progress Notes (Addendum)
 Follow-Up Visit   Subjective  Blake [name] is a 78 y.o. male who presents for the following: Mohs for a BCC on the right cheek, referred by Dr. Felipe Horton.   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   The following portions of the chart were reviewed this encounter and updated as appropriate: medications, allergies, medical history  Review of Systems:  No other skin or systemic complaints except as noted in HPI or Assessment and Plan.  Objective  Well appearing patient in no apparent distress; mood and affect are within normal limits.  A focused examination was performed of the following areas: Right cheek Relevant physical exam findings are noted in the Assessment and Plan.   right cheek Pink pearly papule or plaque with arborizing vessels.    Assessment & Plan   Scar s/p Mohs for [facility] on the left preauricular area, treated on 08/11/2023, repaired with a linear closure - Reassured that wound has healed well - Discussed that scars take up to 12 months to mature from the date of surgery - Recommend SPF 30+ to scar daily to prevent purple color - OK to start scar massage at 4-6 weeks post-op - Can consider silicone based products for scar healing   Scar s/p Mohs for [facility] on the left postauricular area, treated on 08/11/2023, repaired with a linear closure - Reassured that wound has healed well - Discussed that scars take up to 12 months to mature from the date of surgery - Recommend SPF 30+ to scar daily to prevent purple color - OK to start scar massage at 4-6 weeks post-op - Can consider silicone based products for scar healing   Scar s/p Mohs for [facility] on the left cheek, treated on 07/14/2023, repaired with linear closure - Reassured that wound has healed well - Discussed that scars take up to 12 months to mature from the date of surgery - Recommend SPF 30+ to scar daily to  prevent purple color - OK to start scar massage at 4-6 weeks post-op - Can consider silicone based products for scar healing BASAL CELL CARCINOMA (BCC), UNSPECIFIED SITE right cheek Mohs surgery  Consent obtained: written  Anticoagulation: Is the patient taking prescription anticoagulant and/or aspirin  prescribed/recommended by a physician? Yes   Was the anticoagulation regimen changed prior to Mohs? No    Procedure Details: Timeout: pre-procedure verification complete Procedure Prep: patient was prepped and draped in usual sterile fashion Prep type: chlorhexidine  Biopsy accession number: [id number] Biopsy lab: [facility] Date of biopsy: 07/26/2023 Frozen section biopsy performed: No   Specimen debulked: No   Pre-Op diagnosis: basal cell carcinoma BCC subtype: nodular MohsAIQ Surgical site (if tumor spans multiple areas, please select predominant area): cheek (including jawline) Surgery side: right Surgical site (from skin exam): right cheek Pre-operative length (cm): 0.4 Pre-operative width (cm): 0.4 Indications for Mohs surgery: anatomic location where tissue conservation is critical Previously treated? No    Micrographic Surgery Details: Post-operative length (cm): 1.1 Post-operative width (cm): 1.1 Number of Mohs stages: 1 Post surgery depth of defect: subcutaneous fat Is this a complex case (associate members only): No    Stage 1    Tumor features identified on Mohs section: no tumor identified    Depth of tumor invasion after stage: subcutaneous fat    Perineural invasion: no perineural invasion  Patient tolerance of procedure: tolerated well, no immediate complications  Reconstruction: Was  the defect reconstructed? Yes   Was reconstruction performed by the same Mohs surgeon? Yes   Setting of reconstruction: outpatient office When was reconstruction performed? same day Type of reconstruction: linear Linear reconstruction: complex  Opioids: Did the  patient receive a prescription for opioid/narcotic related to Mohs surgery?: No    Antibiotics: Does patient meet AHA guidelines for endocarditis?: No   Does patient meet AHA guidelines for orthopedic prophylaxis?: No   Were antibiotics given on the day of surgery?: No   Did surgery breach mucosa, expose cartilage/bone, involve an area of lymphedema/inflamed/infected tissue? No    Skin repair Complexity:  Complex Final length (cm):  3.4 Informed consent: discussed and consent obtained   Timeout: patient name, date of birth, surgical site, and procedure verified   Procedure prep:  Patient was prepped and draped in usual sterile fashion Prep type:  Chlorhexidine  Anesthesia: the lesion was anesthetized in a standard fashion   Anesthetic:  1% lidocaine  w/ epinephrine  1-100,000 buffered w/ 8.4% NaHCO3 Reason for type of repair: reduce tension to allow closure and preserve normal anatomical and functional relationships   Undermining: edges undermined   Subcutaneous layers (deep stitches):  Suture size:  5-0 Suture type: Monocryl (poliglecaprone 25)   Stitches:  Buried vertical mattress Fine/surface layer approximation (top stitches):  Suture size:  6-0 Suture type: fast-absorbing plain gut   Stitches: simple running   Hemostasis achieved with: suture, pressure and electrodesiccation Outcome: patient tolerated procedure well with no complications   Post-procedure details: sterile dressing applied and wound care instructions given   Dressing type: pressure dressing, bandage and petrolatum   SEBORRHEIC DERMATITIS Head - Anterior (Face) ketoconazole  (NIZORAL ) 2 % cream - Head - Anterior (Face) Apply 1 Application topically 2 (two) times daily. Asked patient to follow up with Dr. Felipe Horton regarding Seborrheic Dermatitis   Return in about 4 weeks (around 10/08/2023).  Tonnie Frederick, Surg Tech III, am acting as scribe for Deneise Finlay, MD.    09/17/2023  HISTORY OF PRESENT  ILLNESS  Blake [name] is seen in consultation at the request of Dr. Felipe Horton for biopsy-proven Nodular Basal Cell Carcinoma of the right cheek. They note that the area has been present for about 6 months increasing in size with time.  There is no history of previous treatment.  Reports no other new or changing lesions and has no other complaints today.  Medications and allergies: see patient chart.  Review of systems: Reviewed 8 systems and notable for the above skin cancer.  All other systems reviewed are unremarkable/negative, unless noted in the HPI. Past medical history, surgical history, family history, social history were also reviewed and are noted in the chart/questionnaire.    PHYSICAL EXAMINATION  General: Well-appearing, in no acute distress, alert and oriented x 4. Vitals reviewed in chart (if available).   Skin: Exam reveals a 0.4 x 0.4 cm erythematous papule and biopsy scar on the left cheek. There are rhytids, telangiectasias, and lentigines, consistent with photodamage.   Biopsy report(s) reviewed, confirming the diagnosis.   ASSESSMENT  1) Nodular Basal Cell Carcinoma of the left cheek 2) photodamage 3) solar lentigines   PLAN   1. Due to location, size, histology, or recurrence and the likelihood of subclinical extension as well as the need to conserve normal surrounding tissue, the patient was deemed acceptable for Mohs micrographic surgery (MMS).  The nature and purpose of the procedure, associated benefits and risks including recurrence and scarring, possible complications such as pain, infection, and  bleeding, and alternative methods of treatment if appropriate were discussed with the patient during consent. The lesion location was verified by the patient, by reviewing previous notes, pathology reports, and by photographs as well as angulation measurements if available.  Informed consent was reviewed and signed by the patient, and timeout was performed at 10:00 AM. See  op note below.  2. For the photodamage and solar lentigines, sun protection discussed/information given on OTC sunscreens, and we recommend continued regular follow-up with primary dermatologist every 6 months or sooner for any growing, bleeding, or changing lesions. 3. Prognosis and future surveillance discussed. 4. Letter with treatment outcome sent to referring provider. 5. Pain acetaminophen /ibuprofen    MOHS MICROGRAPHIC SURGERY AND RECONSTRUCTION  Initial size:   0.4 x 0.4 cm Surgical defect/wound size: 1.1 x 1.1 cm Anesthesia:    0.33% lidocaine  with 1:200,000 epinephrine  EBL:    <5 mL Complications:  None Repair type:   Complex SQ suture:   5-0 Monocryl Cutaneous suture:  6-0 Plain gut Final size of the repair: 3.4 cm  Stages:  1  STAGE I: Anesthesia achieved with 0.5% lidocaine  with 1:200,000 epinephrine . ChloraPrep applied. 1 section(s) excised using Mohs technique (this includes total peripheral and deep tissue margin excision and evaluation with frozen sections, excised and interpreted by the same physician). The tumor was first debulked and then excised with an approx. 2mm margin.  Hemostasis was achieved with electrocautery as needed.  The specimen was then oriented, subdivided/relaxed, inked, and processed using Mohs technique.    Frozen section analysis revealed a clear deep and peripheral margin.   Reconstruction  The surgical wound was then cleaned, prepped, and re-anesthetized as above. Wound edges were undermined extensively along at least one entire edge and at a distance equal to or greater than the width of the defect (see wound defect size above) in order to achieve closure and decrease wound tension and anatomic distortion. Redundant tissue repair including standing cone removal was performed. Hemostasis was achieved with electrocautery. Subcutaneous and epidermal tissues were approximated with the above sutures. The surgical site was then lightly scrubbed with  sterile, saline-soaked gauze. The area was then bandaged using Vaseline ointment, non-adherent gauze, gauze pads, and tape to provide an adequate pressure dressing. The patient tolerated the procedure well, was given detailed written and verbal wound care instructions, and was discharged in good condition.   The patient will follow-up: 4 weeks.    Documentation: I have reviewed the above documentation for accuracy and completeness, and I agree with the above.  Deneise Finlay, MD

## 2023-09-10 NOTE — Patient Instructions (Signed)
 Important Information  Due to recent changes in healthcare laws, you may see results of your pathology and/or laboratory studies on MyChart before the doctors have had a chance to review them. We understand that in some cases there may be results that are confusing or concerning to you. Please understand that not all results are received at the same time and often the doctors may need to interpret multiple results in order to provide you with the best plan of care or course of treatment. Therefore, we ask that you please give [place] 2 business days to thoroughly review all your results before contacting the office for clarification. Should we see a critical lab result, you will be contacted sooner.   If You Need Anything After Your Visit  If you have any questions or concerns for your doctor, please call our main line at [phone] If no one answers, please leave a voicemail as directed and we will return your call as soon as possible. Messages left after 4 pm will be answered the following business day.   You may also send [place] a message via MyChart. We typically respond to MyChart messages within 1-2 business days.  For prescription refills, please ask your pharmacy to contact our office. Our fax number is [phone].  If you have an urgent issue when the clinic is closed that cannot wait until the next business day, you can page your doctor at the number below.    Please note that while we do our best to be available for urgent issues outside of office hours, we are not available 24/7.   If you have an urgent issue and are unable to reach [place], you may choose to seek medical care at your doctor's office, retail clinic, urgent care center, or emergency room.  If you have a medical emergency, please immediately call 911 or go to the emergency department. In the event of inclement weather, please call our main line at [phone] for an update on the status of any delays or  closures.  Dermatology Medication Tips: Please keep the boxes that topical medications come in in order to help keep track of the instructions about where and how to use these. Pharmacies typically print the medication instructions only on the boxes and not directly on the medication tubes.   If your medication is too expensive, please contact our office at [phone] or send [place] a message through MyChart.   We are unable to tell what your co-pay for medications will be in advance as this is different depending on your insurance coverage. However, we may be able to find a substitute medication at lower cost or fill out paperwork to get insurance to cover a needed medication.   If a prior authorization is required to get your medication covered by your insurance company, please allow [place] 1-2 business days to complete this process.  Drug prices often vary depending on where the prescription is filled and some pharmacies may offer cheaper prices.  The website www.goodrx.com contains coupons for medications through different pharmacies. The prices here do not account for what the cost may be with help from insurance (it may be cheaper with your insurance), but the website can give you the price if you did not use any insurance.  - You can print the associated coupon and take it with your prescription to the pharmacy.  - You may also stop by our office during regular business hours and pick up a GoodRx coupon card.  - If  you need your prescription sent electronically to a different pharmacy, notify our office through [facility] or by phone at [phone]    Wound Care Instructions for After Surgery  On the day following your surgery, you should begin doing daily dressing changes until your sutures are removed: Remove the bandage. Cleanse the wound gently with soap and water.  Make sure you then dry the skin surrounding the wound completely or the tape will not stick to the skin. Do not  use cotton balls on the wound. After the wound is clean and dry, apply the ointment (either prescription antibiotic prescribed by your doctor or plain Vaseline if nothing was prescribed) gently with a Q-tip. If you are using a bandaid to cover: Apply a bandaid large enough to cover the entire wound. If you do not have a bandaid large enough to cover the wound OR if you are sensitive to bandaid adhesive: Cut a non-stick pad (such as Telfa) to fit the size of the wound.  Cover the wound with the non-stick pad. If the wound is draining, you may want to add a small amount of gauze on top of the non-stick pad for a little added compression to the area. Use tape to seal the area completely.  For the next 1-2 weeks: Be sure to keep the wound moist with ointment 24/7 to ensure best healing. If you are unable to cover the wound with a bandage to hold the ointment in place, you may need to reapply the ointment several times a day. Do not bend over or lift heavy items to reduce the chance of elevated blood pressure to the wound. Do not participate in particularly strenuous activities.  Below is a list of dressing supplies you might need.  Cotton-tipped applicators - Q-tips Gauze pads (2x2 and/or 4x4) - All-Purpose Sponges New and clean tube of petroleum jelly (Vaseline) OR prescription antibiotic ointment if prescribed Either a bandaid large enough to cover the entire wound OR non-stick dressing material (Telfa) and Tape (Paper or Hypafix)  FOR ADULT SURGERY PATIENTS: If you need something for pain relief, you may take 1 extra strength Tylenol (acetaminophen) and 2 ibuprofen (200 mg) together every 4 hours as needed. (Do not take these medications if you are allergic to them or if you know you cannot take them for any other reason). Typically you may only need pain medication for 1-3 days.   Comments on the Post-Operative Period Slight swelling and redness often appear around the wound. This is normal  and will disappear within several days following the surgery. The healing wound will drain a brownish-red-yellow discharge during healing. This is a normal phase of wound healing. As the wound begins to heal, the drainage may increase in amount. Again, this drainage is normal. Notify us if the drainage becomes persistently bloody, excessively swollen, or intensely painful or develops a foul odor or red streaks.  The healing wound will also typically be itchy. This is normal. If you have severe or persistent pain, Notify us if the discomfort is severe or persistent. Avoid alcoholic beverages when taking pain medicine.  In Case of Wound Hemorrhage A wound hemorrhage is when the bandage suddenly becomes soaked with bright red blood and flows profusely. If this happens, sit down or lie down with your head elevated. If the wound has a dressing on it, do not remove the dressing. Apply pressure to the existing gauze. If the wound is not covered, use a gauze pad to apply pressure and  continue applying the pressure for 20 minutes without peeking. DO NOT COVER THE WOUND WITH A LARGE TOWEL OR WASH CLOTH. Release your hand from the wound site but do not remove the dressing. If the bleeding has stopped, gently clean around the wound. Leave the dressing in place for 24 hours if possible. This wait time allows the blood vessels to close off so that you do not spark a new round of bleeding by disrupting the newly clotted blood vessels with an immediate dressing change. If the bleeding does not subside, continue to hold pressure for 40 minutes. If bleeding continues, page your physician, contact an After Hours clinic or go to the Emergency Room.

## 2023-09-15 ENCOUNTER — Ambulatory Visit: Admitting: Dermatology

## 2023-09-16 ENCOUNTER — Encounter: Payer: Self-pay | Admitting: Dermatology

## 2023-09-16 DIAGNOSIS — J438 Other emphysema: Secondary | ICD-10-CM | POA: Diagnosis not present

## 2023-09-16 DIAGNOSIS — R0602 Shortness of breath: Secondary | ICD-10-CM | POA: Diagnosis not present

## 2023-09-16 DIAGNOSIS — E782 Mixed hyperlipidemia: Secondary | ICD-10-CM | POA: Diagnosis not present

## 2023-09-16 DIAGNOSIS — I10 Essential (primary) hypertension: Secondary | ICD-10-CM | POA: Diagnosis not present

## 2023-09-16 DIAGNOSIS — I4891 Unspecified atrial fibrillation: Secondary | ICD-10-CM | POA: Diagnosis not present

## 2023-09-16 DIAGNOSIS — Z79899 Other long term (current) drug therapy: Secondary | ICD-10-CM | POA: Diagnosis not present

## 2023-09-16 DIAGNOSIS — R5383 Other fatigue: Secondary | ICD-10-CM | POA: Diagnosis not present

## 2023-09-24 ENCOUNTER — Other Ambulatory Visit: Payer: Self-pay

## 2023-09-24 DIAGNOSIS — R944 Abnormal results of kidney function studies: Secondary | ICD-10-CM | POA: Diagnosis not present

## 2023-09-24 MED ORDER — METOPROLOL SUCCINATE ER 100 MG PO TB24
200.0000 mg | ORAL_TABLET | Freq: Every day | ORAL | 1 refills | Status: DC
  Filled 2023-09-24: qty 180, 90d supply, fill #0
  Filled 2023-12-29: qty 180, 90d supply, fill #1

## 2023-09-24 MED ORDER — OMEPRAZOLE 40 MG PO CPDR
40.0000 mg | DELAYED_RELEASE_CAPSULE | Freq: Every day | ORAL | 1 refills | Status: DC
  Filled 2023-09-24: qty 90, 90d supply, fill #0
  Filled 2023-12-29: qty 90, 90d supply, fill #1

## 2023-10-12 ENCOUNTER — Encounter: Payer: Self-pay | Admitting: Oncology

## 2023-10-12 ENCOUNTER — Other Ambulatory Visit: Payer: Self-pay

## 2023-10-13 ENCOUNTER — Ambulatory Visit (INDEPENDENT_AMBULATORY_CARE_PROVIDER_SITE_OTHER): Admitting: Dermatology

## 2023-10-13 ENCOUNTER — Other Ambulatory Visit: Payer: Self-pay

## 2023-10-13 ENCOUNTER — Encounter: Payer: Self-pay | Admitting: Dermatology

## 2023-10-13 DIAGNOSIS — L905 Scar conditions and fibrosis of skin: Secondary | ICD-10-CM | POA: Diagnosis not present

## 2023-10-13 DIAGNOSIS — C4491 Basal cell carcinoma of skin, unspecified: Secondary | ICD-10-CM

## 2023-10-13 DIAGNOSIS — D492 Neoplasm of unspecified behavior of bone, soft tissue, and skin: Secondary | ICD-10-CM | POA: Diagnosis not present

## 2023-10-13 DIAGNOSIS — Z85828 Personal history of other malignant neoplasm of skin: Secondary | ICD-10-CM

## 2023-10-13 NOTE — Progress Notes (Unsigned)
   Follow Up Visit   Subjective  Blake [name] is a 78 y.o. male who presents for the following: follow up from Mohs surgery   The patient presents for follow up from Mohs surgery for a BCC on the right cheek, treated on 09/10/23, repaired with linear closure. The patient has been bandaging the wound as directed. The endorse the following concerns: none   The following portions of the chart were reviewed this encounter and updated as appropriate: medications, allergies, medical history  Review of Systems:  No other skin or systemic complaints except as noted in HPI or Assessment and Plan.  Objective  Well appearing patient in no apparent distress; mood and affect are within normal limits.  A full examination was performed including scalp, head, face. All findings within normal limits unless otherwise noted below.  Healing wound with mild erythema  Relevant physical exam findings are noted in the Assessment and Plan.    Assessment & Plan    Healing s/p Mohs for [facility], treated on 09/10/23, repaired with linear closure - Reassured that wound is healing well - No evidence of infection - No swelling, induration, purulence, dehiscence, or tenderness out of proportion to the clinical exam, see photo above - Discussed that scars take up to 12 months to mature from the date of surgery - Recommend SPF 30+ to scar daily to prevent purple color from UV exposure during scar maturation process - Discussed that erythema and raised appearance of scar will fade over the next 4-6 months - OK to start scar massage at 4-6 weeks post-op - Can consider silicone based products for scar healing starting at 6 weeks post-op - Ok to continue ointment daily to wound under a bandage for another week     No follow-ups on file.  I, Blake [name], CMA, am acting as scribe for Blake CHRISTELLA HOLY, MD.   Documentation: I have reviewed the above documentation for accuracy and completeness, and I agree with the  above.  Blake CHRISTELLA HOLY, MD

## 2023-10-13 NOTE — Patient Instructions (Addendum)

## 2023-10-14 ENCOUNTER — Inpatient Hospital Stay: Payer: Medicare HMO | Attending: Oncology

## 2023-10-14 DIAGNOSIS — E559 Vitamin D deficiency, unspecified: Secondary | ICD-10-CM | POA: Insufficient documentation

## 2023-10-14 DIAGNOSIS — R918 Other nonspecific abnormal finding of lung field: Secondary | ICD-10-CM | POA: Insufficient documentation

## 2023-10-14 DIAGNOSIS — D509 Iron deficiency anemia, unspecified: Secondary | ICD-10-CM | POA: Insufficient documentation

## 2023-10-14 DIAGNOSIS — Z8501 Personal history of malignant neoplasm of esophagus: Secondary | ICD-10-CM | POA: Insufficient documentation

## 2023-10-27 ENCOUNTER — Other Ambulatory Visit: Payer: Self-pay

## 2023-10-28 ENCOUNTER — Inpatient Hospital Stay: Payer: Medicare HMO

## 2023-10-28 ENCOUNTER — Other Ambulatory Visit: Payer: Self-pay

## 2023-10-28 ENCOUNTER — Other Ambulatory Visit: Payer: Medicare HMO

## 2023-10-28 ENCOUNTER — Ambulatory Visit
Admission: RE | Admit: 2023-10-28 | Discharge: 2023-10-28 | Disposition: A | Discharge status: Home / Self Care | Payer: Medicare HMO | Source: Ambulatory Visit | Attending: Oncology | Admitting: Oncology

## 2023-10-28 DIAGNOSIS — N200 Calculus of kidney: Secondary | ICD-10-CM | POA: Diagnosis not present

## 2023-10-28 DIAGNOSIS — R918 Other nonspecific abnormal finding of lung field: Secondary | ICD-10-CM | POA: Diagnosis not present

## 2023-10-28 DIAGNOSIS — Z8501 Personal history of malignant neoplasm of esophagus: Secondary | ICD-10-CM | POA: Diagnosis not present

## 2023-10-28 DIAGNOSIS — C155 Malignant neoplasm of lower third of esophagus: Secondary | ICD-10-CM | POA: Diagnosis not present

## 2023-10-28 DIAGNOSIS — E559 Vitamin D deficiency, unspecified: Secondary | ICD-10-CM | POA: Diagnosis not present

## 2023-10-28 DIAGNOSIS — D509 Iron deficiency anemia, unspecified: Secondary | ICD-10-CM | POA: Diagnosis not present

## 2023-10-28 DIAGNOSIS — N281 Cyst of kidney, acquired: Secondary | ICD-10-CM | POA: Diagnosis not present

## 2023-10-28 DIAGNOSIS — E041 Nontoxic single thyroid nodule: Secondary | ICD-10-CM | POA: Diagnosis not present

## 2023-10-28 LAB — CMP (CANCER CENTER ONLY)
ALT: 30 U/L (ref 0–44)
AST: 26 U/L (ref 15–41)
Albumin: 3.5 g/dL (ref 3.5–5.0)
Alkaline Phosphatase: 68 U/L (ref 38–126)
Anion gap: 7 (ref 5–15)
BUN: 18 mg/dL (ref 8–23)
CO2: 23 mmol/L (ref 22–32)
Calcium: 8.4 mg/dL — ABNORMAL LOW (ref 8.9–10.3)
Chloride: 105 mmol/L (ref 98–111)
Creatinine: 1.11 mg/dL (ref 0.61–1.24)
GFR, Estimated: >60 mL/min (ref >60)
Glucose, Bld: 232 mg/dL — ABNORMAL HIGH (ref 70–99)
Potassium: 4 mmol/L (ref 3.5–5.1)
Sodium: 135 mmol/L (ref 135–145)
Total Bilirubin: 0.8 mg/dL (ref 0.0–1.2)
Total Protein: 6.1 g/dL — ABNORMAL LOW (ref 6.5–8.1)

## 2023-10-28 LAB — VITAMIN D 25 HYDROXY (VIT D DEFICIENCY, FRACTURES): Vit D, 25-Hydroxy: 39.1 ng/mL (ref 30–100)

## 2023-10-28 LAB — CBC WITH DIFFERENTIAL (CANCER CENTER ONLY)
Abs Immature Granulocytes: 0.01 K/uL (ref 0.00–0.07)
Basophils Absolute: 0.1 K/uL (ref 0.0–0.1)
Basophils Relative: 1 %
Eosinophils Absolute: 0.1 K/uL (ref 0.0–0.5)
Eosinophils Relative: 1 %
HCT: 40 % (ref 39.0–52.0)
Hemoglobin: 12.9 g/dL — ABNORMAL LOW (ref 13.0–17.0)
Immature Granulocytes: 0 %
Lymphocytes Relative: 9 %
Lymphs Abs: 0.7 K/uL (ref 0.7–4.0)
MCH: 29.5 pg (ref 26.0–34.0)
MCHC: 32.3 g/dL (ref 30.0–36.0)
MCV: 91.5 fL (ref 80.0–100.0)
Monocytes Absolute: 0.5 K/uL (ref 0.1–1.0)
Monocytes Relative: 6 %
Neutro Abs: 6.8 K/uL (ref 1.7–7.7)
Neutrophils Relative %: 83 %
Platelet Count: 200 K/uL (ref 150–400)
RBC: 4.37 MIL/uL (ref 4.22–5.81)
RDW: 13.4 % (ref 11.5–15.5)
WBC Count: 8.1 K/uL (ref 4.0–10.5)
nRBC: 0 % (ref 0.0–0.2)

## 2023-10-28 MED ORDER — HEPARIN SOD (PORK) LOCK FLUSH 100 UNIT/ML IV SOLN
500.0000 [IU] | Freq: Once | INTRAVENOUS | Status: AC
  Administered 2023-10-28: 500 [IU] via INTRAVENOUS
  Filled 2023-10-28: qty 5

## 2023-10-28 MED ORDER — IOHEXOL 300 MG/ML  SOLN
100.0000 mL | Freq: Once | INTRAMUSCULAR | Status: AC | PRN
  Administered 2023-10-28: 100 mL via INTRAVENOUS

## 2023-10-28 MED ORDER — HEPARIN SOD (PORK) LOCK FLUSH 100 UNIT/ML IV SOLN
INTRAVENOUS | Status: AC
  Filled 2023-10-28: qty 5

## 2023-10-29 LAB — CEA: CEA: 3.6 ng/mL (ref 0.0–4.7)

## 2023-11-05 DIAGNOSIS — I10 Essential (primary) hypertension: Secondary | ICD-10-CM | POA: Diagnosis not present

## 2023-11-05 DIAGNOSIS — Z79899 Other long term (current) drug therapy: Secondary | ICD-10-CM | POA: Diagnosis not present

## 2023-11-05 DIAGNOSIS — E785 Hyperlipidemia, unspecified: Secondary | ICD-10-CM | POA: Diagnosis not present

## 2023-11-05 DIAGNOSIS — I480 Paroxysmal atrial fibrillation: Secondary | ICD-10-CM | POA: Diagnosis not present

## 2023-11-05 DIAGNOSIS — D649 Anemia, unspecified: Secondary | ICD-10-CM | POA: Diagnosis not present

## 2023-11-05 DIAGNOSIS — G8929 Other chronic pain: Secondary | ICD-10-CM | POA: Diagnosis not present

## 2023-11-05 DIAGNOSIS — E119 Type 2 diabetes mellitus without complications: Secondary | ICD-10-CM | POA: Diagnosis not present

## 2023-11-05 DIAGNOSIS — Z0000 Encounter for general adult medical examination without abnormal findings: Secondary | ICD-10-CM | POA: Diagnosis not present

## 2023-11-05 NOTE — Progress Notes (Signed)
 Blake [name] is a 78 y.o. here for Medicare Wellness Visit  MEDICARE WELLNESS VISIT  Providers Rendering Care 1. Dr Reyes Costa (PCP) Pt in NAD. HTN stable on meds. Has HLD on statin, DM not on insulin  and chronic pain on Tramadol . Also with PAF on Eliquis . Weight stable. SOB has improved. Still fatigued. More anemic.  Functional Assessment (1) Hearing: Demonstrates no difficulty in hearing during normal conversation (2) Risk of Falls: Patient denies any falls or near falls in the last year, Gait steady without assistance during walk from waiting area to exam room (3) Home Safety: Patient feels secure in their home, There are operational smoke alarms in multiple areas of the home (4) Activities of Daily Living: Independently manages personal grooming and household chores, including cooking, cleaning and laundry. Manages Personal finances without assistance.  Depression Screening PHQ 2/9 last 3 flowsheet values     11/15/2018    3:55 PM 08/16/2021    2:01 PM 03/02/2023    1:19 PM  PHQ-2/9 Depression Screening   Little interest or pleasure in doing things   0  Feeling down, depressed, or hopeless   0  Patient Health Questionnaire-2 Score   0 *  (OBSOLETE) Little interest or pleasure in doing things 0 0   (OBSOLETE) Feeling down, depressed, or hopeless (or irritable for Teens only)? 0 0   (OBSOLETE) Total Prescreening Score 0 0   (OBSOLETE) Total Score = 0 0     * Data saved with a previous flowsheet row definition     Depression Severity and Treatment Recommendations:  0-4= None  5-9= Mild / Treatment: Support, educate to call if worse; return in one month  10-14= Moderate / Treatment: Support, watchful waiting; Antidepressant or Psychotherapy  15-19= Moderately severe / Treatment: Antidepressant OR Psychotherapy  >= 20 = Major depression, severe / Antidepressant AND Psychotherapy   Cognitive Impairment Patient denies episodes of loosing things, being forgetful. Seems  oriented to person, place and time.  Responses appear appropriate and timely to this observer.  PREVENTION PLAN  Cardiovascular: FLP assessed LDL=72 Diabetes: A1c or FBG assessed A1c=7.2 Glaucoma: N/A Hepatitis B (HBV) Vaccine:  Not Applicable Smoking Cessation:  Not Applicable  Other Personalized Health Advice  Encouraged patient to exercise 5 days a week, walking, water aerobics, gentle stretching recommended. Increase dietary intake of fresh fruits and vegetables, reduce red meat to twice a week.  End of Life Counseling Patient has living will in place; POA - ; Full Code  Current Outpatient Medications  Medication Sig Dispense Refill  . apixaban  (ELIQUIS ) 5 mg tablet Take 1 tablet (5 mg total) by mouth every 12 (twelve) hours 60 tablet 5  . cyanocobalamin  (VITAMIN B12) 1000 MCG tablet Take 1,000 mcg by mouth once daily    . dilTIAZem  (CARDIZEM  CD) 180 MG CD capsule Take 1 capsule (180 mg total) by mouth daily. Please hold until you see your cardiologist 90 capsule 1  . ibuprofen  (MOTRIN ) 800 MG tablet Take 1 tablet (800 mg total) by mouth every 8 (eight) hours as needed for Pain 60 tablet 1  . lisinopriL  (ZESTRIL ) 10 MG tablet Take 1 tablet (10 mg total) by mouth daily. 30 tablet 3  . methocarbamoL  (ROBAXIN ) 500 MG tablet Take 1 tablet (500 mg total) by mouth every 6 (six) hours as needed 90 tablet 1  . metoprolol  SUCCinate (TOPROL -XL) 100 MG XL tablet Take 2 tablets (200 mg total) by mouth daily. 180 tablet 1  . mirtazapine  (REMERON ) 15 MG tablet  Take 1 tablet (15 mg total) by mouth Nightly. 30 tablet 11  . omeprazole  (PRILOSEC) 40 MG DR capsule Take 1 capsule (40 mg total) by mouth daily. 90 capsule 1  . promethazine  (PHENERGAN ) 25 MG tablet Take 1 tablet (25 mg total) by mouth every 6 (six) hours as needed 60 tablet 0  . rosuvastatin  (CRESTOR ) 10 MG tablet Take 1 tablet (10 mg total) by mouth once daily 90 tablet 3  . tamsulosin  (FLOMAX ) 0.4 mg capsule Take 1 capsule (0.4 mg  total) by mouth once daily TAKE 30 MINUTES AFTER SAME MEAL EACH DAY 90 capsule 3  . traMADoL  (ULTRAM ) 50 mg tablet Take 50 mg by mouth every 6 (six) hours as needed     No current facility-administered medications for this visit.    Allergies as of 11/05/2023 - Reviewed 11/05/2023  Allergen Reaction Noted  . Sulfa (sulfonamide antibiotics) Rash 09/05/2013    Patient Active Problem List  Diagnosis  . Mixed hyperlipidemia  . GERD (gastroesophageal reflux disease)  . Osteoarthritis  . Coronary artery disease  . Stenosing tenosynovitis of finger  . Benign essential hypertension  . Bilateral carotid artery stenosis  . Primary osteoarthritis of right shoulder  . Type II diabetes mellitus with manifestations (CMS/HHS-HCC)  . Bilateral hand numbness  . Bilateral hand pain  . Carotid stenosis  . Lumbar degenerative disc disease  . Lumbar radiculopathy  . Spinal stenosis, lumbar region, with neurogenic claudication  . Allergic rhinitis  . Acute postoperative pain  . Chronic pain syndrome  . Goals of care, counseling/discussion  . History of head and neck cancer  . History of lumbar laminectomy  . Non-intractable vomiting  . Pneumonia  . Paroxysmal A-fib (CMS/HHS-HCC)  . Cerebrovascular accident (CVA) due to embolism of cerebral artery (CMS/HHS-HCC)  . Acidosis  . Anemia  . Encephalomalacia on imaging study  . Hypertensive urgency  . Hypocalcemia  . IDA (iron  deficiency anemia)  . Loss of consciousness (CMS/HHS-HCC)  . Lung nodules  . Paraseptal emphysema (CMS/HHS-HCC)  . Paresthesia  . Positive D dimer  . Shortness of breath  . Sinus bradycardia  . Spondylolisthesis  . Vasovagal syncope    Past Medical History:  Diagnosis Date  . Acute postoperative pain 09/13/2019  . Allergic rhinitis 08/24/2015  . Anemia 04/25/2022  . Benign essential hypertension 07/21/2014  . Bilateral carotid artery stenosis 06/05/2016   50-69% bilateral 2018  . Bilateral hand numbness 12/16/2018   . Bilateral hand pain 12/16/2018  . Carotid stenosis 04/22/2016   Last Assessment & Plan:  The patient's carotid duplex today demonstrates stable 1 to 39% right ICA stenosis but progression of his disease on the left now into the 60 to 79% range.  He remains asymptomatic.  We discussed full-strength aspirin  versus the addition of Plavix  and for now we will continue on the aspirin .  He will continue his high-dose statin therapy.  We will shorten his follow-up to   . Chronic pain syndrome 12/08/2019  . Coronary artery disease 06/26/2014   Drug eluding stent  100% lesion in the mid LAD. Occluded with poor collaterals. 1st diagonal 25% stenosis. 06/14/14   . Diabetes mellitus without complication (CMS/HHS-HCC)   . Esophageal cancer (CMS/HHS-HCC) 04/2019  . GERD (gastroesophageal reflux disease)   . Goals of care, counseling/discussion 05/04/2019  . History of head and neck cancer 12/09/2019  . History of lumbar laminectomy 12/08/2019   Formatting of this note might be different from the original. L3-L4 and L4-L5 microdiscectomy and laminectomy  with Dr. Katrina December 2019  . Hyperlipidemia   . Hypertension   . Lumbar degenerative disc disease 06/02/2017  . Lumbar radiculopathy   . 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 Mixed hyperlipidemia   . Non-intractable vomiting 12/26/2019  . Osteoarthritis    carpal tunnel, hands  . Paroxysmal A-fib (CMS/HHS-HCC) 12/08/2019  . Pneumonia 01/17/2020  . Primary osteoarthritis of right shoulder 07/01/2016  . Spinal stenosis, lumbar region, 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  . Stenosing tenosynovitis of finger 07/11/2014  . 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 Type II diabetes mellitus with manifestations (CMS/HHS-HCC) 04/27/2018    Past Surgical History:  Procedure Laterality Date  . Trigger finger release Right 2013  . JOINT REPLACEMENT Right 01/24/2013   RIGHT TOTAL HIP ARTHROPLASTY   . L3-4 L4-5 lumbar decompression  03/03/2018   Dr [facility]eves [facility]  . right hand surgery Right 01/31/2019  . COLONOSCOPY  03/21/2019   Tubular adenoma of the colon. 5 year repeat.   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 Repeat per oncology Per [facility].  SABRA SANDMAN W/BIOPSY N/A 06/09/2019   Procedure: THERAPEUTIC EGD;  Surgeon: Ramona Sammi Haws, MD;  Location: [facility];  Service: Gastroenterology;  Laterality: N/A;  . ESOPHAGOGASTRODOUDENOSCOPY N/A 06/09/2019   Procedure: ENDOSCOPIC MUCOSAL RESECTION;  Surgeon: Ramona Sammi Haws, MD;  Location: [facility];  Service: Gastroenterology;  Laterality: N/A;  . ESOPHAGOGASTRODOUDENOSCOPY W/BIOPSY N/A 08/08/2019   Procedure: ESOPHAGOGASTRODUODENOSCOPY, FLEXIBLE, TRANSORAL; WITH BIOPSY, SINGLE OR MULTIPLE;  Surgeon: Medora Debby LABOR, MD;  Location: [facility];  Service: Cardiothoracic;  Laterality: N/A;  . ESOPHAGOGASTRODOUDENOSCOPY N/A 08/08/2019   Procedure: ESOPHAGOGASTRODUODENOSCOPY, FLEXIBLE, TRANSORAL; WITH ENDOSCOPIC MUCOSAL RESECTION, possible;  Surgeon: Medora Debby LABOR, MD;  Location: [facility];  Service: Cardiothoracic;  Laterality: N/A;  . JEJUNOSTOMY N/A 09/12/2019   Procedure: TUBE OR NEEDLE CATHETER JEJUNOSTOMY FOR ENTERAL ALIMENTATION,INTRAOPERATIVE, ANY METHOD (LIST IN ADDITION TO PRIMARY PROCEDURE);  Surgeon: Medora Debby LABOR, MD;  Location: [facility];  Service: Cardiothoracic;  Laterality: N/A;  . ESOPHAGOGASTRODOUDENOSCOPY W/TRANSENDOSCOPIC STENT PLACEMENT N/A 09/23/2019   Procedure: ESOPHAGOGASTRODUODENOSCOPY, FLEXIBLE, TRANSORAL; WITH PLACEMENT OF ENDOSCOPIC STENT (INCLUDES PRE- AND POST-DILATION AND GUIDE WIREPASSAGE, WHEN PERFORMED);  Surgeon: Medora Debby LABOR, MD;  Location: [facility];  Service: Cardiothoracic;  Laterality: N/A;  . ESOPHAGOGASTRODOUDENOSCOPY W/TRANSENDOSCOPIC STENT  PLACEMENT N/A 11/11/2019   Procedure: ESOPHAGOGASTRODUODENOSCOPY, FLEXIBLE, TRANSORAL; WITH removal OF ENDOSCOPIC STENT (INCLUDES PRE- AND POST-DILATION AND GUIDE WIREPASSAGE, WHEN PERFORMED), posible stent revision;  Surgeon: Medora Debby LABOR, MD;  Location: [facility];  Service: Cardiothoracic;  Laterality: N/A;  . LEFT L3-4 MICRODISCECTOMY AND L4-5 DECOMPRESSION  02/24/2020   Dr. Reeves Daisy @[facility]   . OPEN L3-4 TRANSFORAMINAL LUMBAR INTERBODY FUSION   07/13/2020   Dr. Reeves Daisy at [facility] [place]  . CORONARY ANGIOPLASTY WITH STENT PLACEMENT    . SPINE SURGERY    . Tongue biopsy surgery      Health Maintenance  Topic Date Due  . Diabetes Education  Never done  . Hepatitis C Screen  Never done  . Monofilament Foot Exam  Never done  . Annual Urine Albumin Creatinine Ratio  Never done  . Shingrix (1 of 2) Never done  . AAA Screen  Never done  . Pneumococcal Vaccine: 50+ (2 of 2 - PCV) 02/01/2013  . Gastroscopy (EGD)  06/09/2022  . COVID-19 Vaccine (5 - 2024-25 season)  11/30/2022  . Diabetes Eye Assessment Exam  09/22/2023  . Influenza Vaccine (1) 11/30/2023  . Hemoglobin A1C  01/05/2024  . Adult Tetanus (Td And Tdap)  02/23/2024  . Depression Screening  03/01/2024  . Annual Physical/Well Child Check  03/02/2024  . Colorectal Cancer Screening  03/20/2024  . Lipid Panel  04/01/2024  . Creatinine Level  09/15/2024  . Potassium Level  09/23/2024  . Serum Calcium   09/23/2024  . Medicare Subsequent AWV [id number]  11/05/2024  . RSV Immunization Pregnant or 60+  Completed  . Hib Vaccines  Aged Out  . Hepatitis A Vaccines  Aged Out  . Meningococcal B Vaccine  Aged Out  . Meningococcal ACWY Vaccine  Aged Out  . HPV Vaccines  Aged Out    Vitals:   11/05/23 1532  BP: 102/60  Pulse: 57  SpO2: 98%  Weight: 63 kg (139 lb)  Height: 165.1 cm (5' 5)  PainSc:   7  PainLoc: Back   Body mass index is 23.13 kg/m. General. Alert oriented x3    Eyes. Sclera and  conjunctiva clear; pupils equal round and reactive to light and accommodation; extraocular movements intact Nose. Mucosa healthy without drainage or ulceration Oropharynx. No suspicious lesions Neck. No swelling, masses, stiffness, pain, limited movement, carotid pulses normal bilaterally, thyroid  normal size, no masses palpated.  No bruits Lungs. Respirations unlabored; clear to auscultation bilaterally Back. No spinal deformity Cardiovascular. Heart regular rate and rhythm without murmurs, gallops, or rubs Abdomen. Soft; non tender; non distended; normoactive bowel sounds; no masses or organomegaly Lymph Nodes. No significant cervical, supraclavicular, axillary or inguinal lymphadenopathy noted Musculoskeletal. No deformities; no active joint inflammation Extremities. Normal, no edema Pulses. Dorsalis pedis palpable and symmetric bilaterally Neurologic. Alert and oriented; speech intact; face symmetrical; moves all extremities well  Assessment/Plan  1. Medicare wellness visit- Medications and allergies reviewed. Copy of preventative health provided.  Labs reviewed. 2. Fatigue with anemia- labs and stool cards 3. DM- same meds, labs today 4. HTN- stable, same meds 5. HLD- diet/exercise/statin 6. RTC 3 mo, sooner if needed Reyes JONETTA Costa, MD, MD  *Some images could not be shown.

## 2023-11-09 ENCOUNTER — Encounter: Payer: Self-pay | Admitting: Oncology

## 2023-11-09 ENCOUNTER — Inpatient Hospital Stay: Payer: Medicare HMO | Attending: Oncology | Admitting: Oncology

## 2023-11-09 VITALS — BP 121/41 | HR 57 | Temp 97.5°F | Resp 18 | Wt 139.3 lb

## 2023-11-09 DIAGNOSIS — E559 Vitamin D deficiency, unspecified: Secondary | ICD-10-CM | POA: Insufficient documentation

## 2023-11-09 DIAGNOSIS — R918 Other nonspecific abnormal finding of lung field: Secondary | ICD-10-CM

## 2023-11-09 DIAGNOSIS — Z95828 Presence of other vascular implants and grafts: Secondary | ICD-10-CM | POA: Diagnosis not present

## 2023-11-09 DIAGNOSIS — Z8501 Personal history of malignant neoplasm of esophagus: Secondary | ICD-10-CM | POA: Insufficient documentation

## 2023-11-09 DIAGNOSIS — C155 Malignant neoplasm of lower third of esophagus: Secondary | ICD-10-CM

## 2023-11-09 NOTE — Assessment & Plan Note (Signed)
 He prefers to keep port. Continue port flush every 6-8 weeks

## 2023-11-09 NOTE — Assessment & Plan Note (Signed)
Vitamin D has normalized

## 2023-11-09 NOTE — Progress Notes (Signed)
 Hematology/Oncology Progress note [phone] [phone]      CHIEF COMPLAINTS/REASON FOR VISIT:  Follow up  esophageal cancer, IDA, Vitamin Deficiency, Hypocalcemia.   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  Additional chemotherapy was not completed due to multiple hospitalization due to infection and back surgery. Labs are reviewed and discussed with patient June 2025 CT no evidence of recurrence.  He is 4+ year after surgery Continue surveillance.  Obtain CT chest abdomen pelvis in June 2026  Hypocalcemia Continue calcium  1200mg  supplementation.  Lung nodules Stable on CT scan.   Port-A-Cath in place He prefers to keep port. Continue port flush every 6-8 weeks  Vitamin D  deficiency Vitamin D  has normalized.  Orders Placed This Encounter  Procedures   CT CHEST ABDOMEN PELVIS W CONTRAST    Standing Status:   Future    Expected Date:   09/09/2024    Expiration Date:   11/08/2024    If indicated for the ordered procedure, I authorize the administration of contrast media per Radiology protocol:   Yes    Does the patient have a contrast media/X-ray dye allergy?:   No    Preferred imaging location?:   [facility]    If indicated for the ordered procedure, I authorize the administration of oral contrast media per Radiology protocol:   Yes   CBC with Differential (Cancer Center Only)    Standing Status:   Future    Expected Date:   09/09/2024    Expiration Date:   12/08/2024   CMP (Cancer Center only)    Standing Status:   Future    Expected  Date:   09/09/2024    Expiration Date:   12/08/2024   CEA    Standing Status:   Future    Expected Date:   09/09/2024    Expiration Date:   12/08/2024    Follow-up in 9 to 10 months All questions were answered. The patient knows to call the clinic with any problems, questions or concerns.  Call Babara, MD, PhD [facility] Hematology Oncology 11/09/2023    HISTORY OF PRESENTING ILLNESS:   Blake [name] is a  78 y.o.  male presents for follow-up of esophageal cancer Patient has longstanding Barrett's esophagus. Oncology History  Malignant neoplasm of lower third of esophagus (HCC)  07/14/2019 Initial Diagnosis   Patient has longstanding Barrett's esophagus. 03/21/2019 patient underwent surveillance endoscopy which showed esophageal mucosal changes secondary to long segment Barrett's disease present in the lower third of the esophagus.  Maximal longitudinal extent of these mucosal changes were 7 cm in length.  Mucosa was biopsied with cold forceps for histology in 4 quadrants at the interval of 2 cm in the lower third of esophagus.  2 cm hiatal hernia, gastritis Esophageal biopsy 3 out of the 4 biopsies were positive for intramucosal carcinoma involving Barrett's mucosa with high-grade dysplasia.  1 out of 4 esophagus biopsy showed Barrett's mucosa with erosion, indefinite for dysplasia  06/09/19, EMR at [facility].  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 EMR specimen was received in multiple fragments and margins can not be evaluated.  1  gastric polyps were biopsied which showed fundic gland polyp. Negative for dysplasia   08/08/19, to OR for EGD with sessile tumor from 29-33cm, at least 1.5 cm wide. Pathology with the following: 1) esophagus (30cm) - residual invasive adenocarcinoma, at least invasive to muscularis mucosa and 2) esophagus (30cm) - superficial fragments of highly dysplastic epithelium, focally suspicious for intramucosal carcinoma    09/12/2019  Surgery    At [facility], patient underwent esophagectomy with pathological revealed  Negative esophageal margin, gastric margin, esophagogastrectomy, invasive moderately differentiated esophageal adenocarcinoma involved distal esophageal and GE junction, tumor focally invades the submucosa.  Left gastric lymph node x 4 negative, greater curvature lymph nodes 1/3 is positive for 2mm metastatic carcinoma.  Periesophageal lymph node x 6, negative, final pathology stage pT1b pn1, Esophageal adenocarcinoma, Stage IIB Lymph node positive diease, no neoadjuvant chemotherapy, recommended by  Dr. Sheilda and was recommended for adjuvant chemotherapy.  Patient prefers to receive treatment closer to home and patient call back to establish care. No radiation was recommended due to the concern of dehiscence. 11/28/2019, patient had a stent removal.    12/09/2019 Cancer Staging   Staging form: Esophagus - Adenocarcinoma, AJCC 8th Edition - Pathologic stage from 12/09/2019: Stage IIB (pT1b, pN1, cM0, G2) - Signed by Babara Call, MD on 12/09/2019    11/2019 -  Chemotherapy   September 2021, patient underwent Xeloda  1500 mg twice daily/oxaliplatin .  He reports not tolerating well due to nausea and diarrhea. 12/24/2019 patient's wife was diagnosed with COVID-19 infection and patient was tested negative.  Both patient and wife received monoclonal antibody treatments.  Patient prefers to switch to FOLFOX. He had Mediport placed on 01/12/2020 by Dr. Luann. Additional chemotherapy was interrupted and delayed due to his aspiration pneumonia, and later due to his preference of proceeding with back surgery first- see below.    01/18/2020 -  Hospital Admission   Patient was advised to go to emergency room and was later admitted for pneumonia treatments.  There was concern of aspiration pneumonia.  Patient was evaluated by speech therapy.  No overt clinical signs of aspiration during p.o. intake.  Cannot rule out possibility of esophageal  phase retrograde flow activity patient denies further swallowing evaluation.  Patient was discharged on 01/19/2020.   01/27/2020 Imaging   CT chest with contrast showed extensive nodular airspace process in the right lung with loss of volume and areas of subpleural atelectasis.  Likely polylobar pneumonia with aspiration.  No mediastinal or hilar mass or adenopathy.  Surgical changes   02/24/2020 Surgery   patient had L4-5 lumbar decompression, L3--4 microdiscectomy.  Per note he tolerates procedure very well. djuvant chemotherapy has been delayed significantly due to above reasons. At that point, the benefit is uncertain to resume adjuvant chemotherapy 9 months after original surgery.   04/10/2020 Mammogram   PET scan showed progressive right lung area of dense consolidation and clustering nodularity with correlated hypermetabolic him.  Favor aspiration/chronic infection.  No signs of metastatic disease.  Decision was made to continue observation    06/26/2020 - 06/27/2020 Hospital Admission   admission due to chest pain. Toponin was negative, CXR negative.CT chest angiogram showed np PE, multifocal pneumonia through out right lung, improved. Increasing moderate left pleural effusion. Unchanged trace right pleural effusion.   07/12/2020 Imaging   CT abdomen pelvis with contrast - no findings of active malignancy. masslike structure in the left upper quadrant represents a confluent region of fat necrosis. Not hypermetabolic on previous PET. Nodular and bandlike opacities  at right lung base have improved comparing to 06/26/2020. Substantial narrowing of right lowe lobe tracheobronchial tree. Other chronic findings.   07/16/2020 Surgery   07/16/2020 s/p spondylolisthesis   12/04/2020 Imaging   CT chest abdomen pelvis showed no evidence of new or progressive findings to suggest recurrent or metastatic disease.  Stable volume loss right hemithorax with diffuse micro nodularity in the right lung. ?  Atypical infection.  He declined pulmonology work up    06/29/2021 Imaging   06/29/2021, CT chest abdomen pelvis with contrast was reviewed by me and discussed with patient No evidence of recurrent or metastatic disease.  extensive posterior centrilobular and tree-in-bud nodularity throughout the right lung.  With areas of peripheral consolidation.  Unchanged and is consistent with atypical infection   12/30/2021 Imaging   CT chest abdomen pelvis w contrast 1. Status post esophagectomy and gastric pull-through. No evidence of local recurrence or metastatic disease. 2. Numerous solid nodules are seen throughout the right hemithorax,not significantly changed when compared with prior exam and likely due to chronic atypical infection. 3. No evidence of metastatic disease in the abdomen or pelvis. 4. Aortic Atherosclerosis (ICD10-I70.0) and Emphysema (ICD10-J43.9    04/26/2022 Imaging   MRI brain without contrast 1. 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 2. 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 Sheryn.  His initial PET scan 11/08/2004 showed abnormal localization in a right neck mass possibly a lymph node, slightly asymmetric localization at the base of the tongue.  Pathology is not available in current EMR.  admitted from 04/25/2022 to 04/26/2022. 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   Currently patient is taking Plavix  and aspirin  81 mg.  He has some residual left finger paresthesia.   INTERVAL HISTORY Blake [name] is a 78 y.o. male who has above history reviewed by me today presents for follow up visit for management of esophageal cancer.  Manageable GERD symptoms.  Denies  any unintentional weight loss, abdominal pain, nausea vomiting.  Review of Systems  Constitutional:  Negative for appetite change, chills, diaphoresis, fatigue, fever and unexpected weight change.  HENT:   Negative for hearing loss, lump/mass, nosebleeds, sore throat and voice change.   Eyes:  Negative for eye problems and icterus.  Respiratory:  Negative for chest tightness, cough, hemoptysis, shortness of breath and wheezing.   Cardiovascular:  Negative for chest pain and leg swelling.  Gastrointestinal:  Negative for abdominal distention, abdominal pain, blood in stool, diarrhea, nausea and rectal pain.  Endocrine: Negative for hot flashes.  Genitourinary:  Negative for bladder incontinence, difficulty urinating, dysuria, frequency, hematuria and nocturia.   Musculoskeletal:  Negative for arthralgias, back pain, flank pain, gait problem and myalgias.  Skin:  Negative for itching and rash.  Neurological:  Negative for dizziness, gait problem, headaches, light-headedness, numbness and seizures.  Hematological:  Negative for adenopathy. Does not bruise/bleed easily.  Psychiatric/Behavioral:  Negative for confusion and decreased concentration. The patient is not nervous/anxious.     MEDICAL HISTORY:  Past Medical History:  Diagnosis Date   Actinic keratosis    Arthritis    Back pain    with leg pain   Basal cell carcinoma 09/04/2009   R cheek 5.5 cm ant to earlobe - 12/26/2009 excision  Basal cell carcinoma 09/04/2009   R ant nasal alar rim   Basal cell carcinoma 07/31/2014   R mid brow   Basal cell carcinoma 07/30/2016   R ant nasal alar rim at ant edge of BCC scar   Basal cell carcinoma 11/02/2019   L zygoma    Basal cell carcinoma 07/22/2021   left sideburn area, [facility]   Basal cell carcinoma 01/02/2022   Right posterior shoulder, [facility]   Basal cell carcinoma 01/02/2022   R posterior shoulder, [facility]   Basal cell carcinoma 01/02/2022   Right Supraclavicular Base of Neck, [facility]    Basal cell carcinoma 06/15/2023   Left mid cheek. Nodular pattern. Mohs with Dr. Corey 07/14/23   Basal cell carcinoma 06/15/2023   Left preauricular. Nodular pattern. Mohs with Dr. Corey pending.   Basal cell carcinoma 06/15/2023   Left post auricular. Nodular pattern. Mohs with Dr. Corey pending.   Basal cell carcinoma 07/21/2023   Right cheek. Nodular BCC. Mohs wiht Dr. Corey pending.   Cancer of base of tongue (HCC) 2006   s/p chemoradiation   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, bilateral    Dyspnea    Dysrhythmia    Esophageal cancer (HCC) 2021   GERD (gastroesophageal reflux disease)    Hyperlipidemia    Hypertension    Numbness and tingling of both lower extremities    with positioning   Paroxysmal atrial fibrillation (HCC)    Port-A-Cath in place 2006   Prostate enlargement    Squamous cell carcinoma of skin 12/14/2019   L pretibial - ED&C    T2DM (type 2 diabetes mellitus) (HCC)     SURGICAL HISTORY: Past Surgical History:  Procedure Laterality Date   ABDOMINAL SURGERY     CAROTID PTA/STENT INTERVENTION Right 07/04/2021   Procedure: CAROTID PTA/STENT INTERVENTION;  Surgeon: Marea Selinda RAMAN, MD;  Location: [facility] INVASIVE CV LAB;  Service: Cardiovascular;  Laterality: Right;   COLONOSCOPY  12/08/2003   COLONOSCOPY WITH PROPOFOL  N/A 03/21/2019   Procedure: COLONOSCOPY WITH PROPOFOL ;  Surgeon: Toledo, Ladell POUR, MD;  Location: [facility] ENDOSCOPY;  Service: Gastroenterology;  Laterality: N/A;   ESOPHAGOGASTRODUODENOSCOPY (EGD) WITH PROPOFOL  N/A 03/21/2019   Procedure: ESOPHAGOGASTRODUODENOSCOPY (EGD) WITH PROPOFOL ;  Surgeon: Toledo, Ladell POUR, MD;  Location: [facility] ENDOSCOPY;  Service: Gastroenterology;  Laterality: N/A;   esophogeal cancer     JOINT REPLACEMENT Right    total hip   LUMBAR LAMINECTOMY/DECOMPRESSION MICRODISCECTOMY N/A 03/03/2018   Procedure: LUMBAR LAMINECTOMY/DECOMPRESSION MICRODISCECTOMY 1 LEVEL-L3-4,L4-5;  Surgeon: Clois Fret, MD;  Location: [facility]  ORS;  Service: Neurosurgery;  Laterality: N/A;   LUMBAR LAMINECTOMY/DECOMPRESSION MICRODISCECTOMY N/A 02/24/2020   Procedure: LEFT L3-4 MICRODISCECTOMY, L4-5 DECOMPRESSION;  Surgeon: Clois Fret, MD;  Location: [facility] ORS;  Service: Neurosurgery;  Laterality: N/A;   MAXIMUM ACCESS (MAS) TRANSFORAMINAL LUMBAR INTERBODY FUSION (TLIF) 1 LEVEL N/A 07/13/2020   Procedure: OPEN L3-4 TRANSFORAMINAL LUMBAR INTERBODY FUSION (TLIF);  Surgeon: Clois Fret, MD;  Location: [facility] ORS;  Service: Neurosurgery;  Laterality: N/A;   PARTIAL HIP ARTHROPLASTY Right 2014   PORT-A-CATH REMOVAL     PORTACATH PLACEMENT Left 01/12/2020   Procedure: INSERTION PORT-A-CATH, with ultrasound fluoroscopy;  Surgeon: Volney Lye, MD;  Location: [facility] ORS;  Service: General;  Laterality: Left;   TONGUE BIOPSY  2006   TONSILLECTOMY     TRIGGER FINGER RELEASE Right 2013   UPPER GI ENDOSCOPY  01/25/14   multiple gastric polyps    SOCIAL HISTORY: Social History   Socioeconomic History   Marital status: Widowed  Spouse name: Not on file   Number of children: 1   Years of education: Not on file   Highest education level: Not on file  Occupational History   Not on file  Tobacco Use   Smoking status: Former    Current packs/day: 0.00    Average packs/day: 1 pack/day for 30.0 years (30.0 ttl pk-yrs)    Types: Cigarettes    Start date: 02/19/1964    Quit date: 02/18/1994    Years since quitting: 29.7   Smokeless tobacco: Never  Vaping Use   Vaping status: Never Used  Substance and Sexual Activity   Alcohol use: Not Currently   Drug use: No   Sexual activity: Yes  Other Topics Concern   Not on file  Social History Narrative   Lives by himself. Friends in the area. Son Medford lives in [place].    Social Drivers of Corporate investment banker Strain: Low Risk  (03/02/2023)   [facility]   Overall Financial Resource Strain (CARDIA)    Difficulty of Paying Living Expenses: Not  hard at all  Food Insecurity: No Food Insecurity (03/02/2023)   Received from [facility]   Hunger Vital Sign    Within the past 12 months, you worried that your food would run out before you got the money to buy more.: Never true    Within the past 12 months, the food you bought just didn't last and you didn't have money to get more.: Never true  Transportation Needs: No Transportation Needs (03/02/2023)   Received [facility] - Transportation    In the past 12 months, has lack of transportation kept you from medical appointments or from getting medications?: No    Lack of Transportation (Non-Medical): No  Physical Activity: Not on file  Stress: Not on file  Social Connections: Not on file  Intimate Partner Violence: Not on file    FAMILY HISTORY: Family History  Problem Relation Age of Onset   Diabetes Mother    Congestive Heart Failure Mother    Breast cancer Mother    Lung cancer Father     ALLERGIES:  is allergic to sulfa antibiotics.  MEDICATIONS:  Current Outpatient Medications  Medication Sig Dispense Refill   apixaban  (ELIQUIS ) 5 MG TABS tablet Take 1 tablet (5 mg total) by mouth every 12 (twelve) hours. 60 tablet 5   azelastine  (ASTELIN ) 0.1 % nasal spray Place 2 sprays into both nostrils 2 (two) times daily. Use in each nostril as directed 30 mL 2   chlorhexidine  (PERIDEX ) 0.12 % solution Rinse with 15 mLs by Mouth Rinse route 2 (two) times daily and spit out. 473 mL 12   cyanocobalamin  1000 MCG tablet Take 1 tablet (1,000 mcg total) by mouth daily. 90 tablet 0   diltiazem  (CARDIZEM  CD) 180 MG 24 hr capsule Take 1 capsule (180 mg total) by mouth daily. Please hold until you see your cardiologist 90 capsule 1   ferrous sulfate  325 (65 FE) MG tablet Take 1 tablet (325 mg total) by mouth 2 (two) times daily with a meal. 60 tablet 3   fluocinonide -emollient (LIDEX -E) 0.05 % cream Apply topically 2 (two) times a day if  needed. 180 g 3   gatifloxacin  (ZYMAXID ) 0.5 % SOLN Place 1 drop into the right eye 4 (four) times daily. Store upside down. 5 mL 1   gatifloxacin  (ZYMAXID ) 0.5 % SOLN Instill one (1) drop into  the left eye four (4) times a day as directed. Store bottle upside down. 5 mL 1   ibuprofen  (ADVIL ) 800 MG tablet Take 1 tablet (800 mg total) by mouth every 8 (eight) hours as needed for pain. 60 tablet 1   ketorolac  (ACULAR ) 0.5 % ophthalmic solution Place 1 drop into the right eye 4 (four) times daily. 10 mL 1   ketorolac  (ACULAR ) 0.5 % ophthalmic solution Instill one (1) drop into the left eye four (4) times daily as directed. 10 mL 1   lisinopril  (ZESTRIL ) 10 MG tablet Take 1 tablet (10 mg total) by mouth daily. 30 tablet 3   methocarbamol  (ROBAXIN ) 500 MG tablet Take 1 tablet (500 mg total) by mouth every 6 (six) hours as needed. 90 tablet 1   metoprolol  succinate (TOPROL -XL) 100 MG 24 hr tablet Take 2 tablets (200 mg total) by mouth daily. 180 tablet 1   mirtazapine  (REMERON ) 15 MG tablet Take 1 tablet (15 mg total) by mouth Nightly. 30 tablet 11   omeprazole  (PRILOSEC) 40 MG capsule Take 1 capsule (40 mg total) by mouth daily. 90 capsule 1   prednisoLONE  acetate (PRED FORTE ) 1 % ophthalmic suspension Place 1 drop into the right eye 4 (four) times daily. Please shake before each use. 5 mL 1   pseudoephedrine -guaifenesin  (MUCINEX  D) 60-600 MG 12 hr tablet Take 1 tablet by mouth every 12 (twelve) hours for 5 days 10 tablet 0   rosuvastatin  (CRESTOR ) 10 MG tablet Take 1 tablet (10 mg total) by mouth once daily 90 tablet 3   tamsulosin  (FLOMAX ) 0.4 MG CAPS capsule TAKE 1 CAPSULE BY MOUTH ONCE DAILY. TAKE 30 MINUTES AFTER SAME MEAL EACH DAY. 90 capsule 3   tamsulosin  (FLOMAX ) 0.4 MG CAPS capsule Take 1 capsule (0.4 mg total) by mouth once daily TAKE 30 MINUTES AFTER SAME MEAL EACH DAY 90 capsule 3   Tiotropium Bromide  Monohydrate (SPIRIVA  RESPIMAT) 2.5 MCG/ACT AERS Inhale 2 puffs into the lungs daily. 4 g 1    traMADol  (ULTRAM ) 50 MG tablet Take 1 tablet (50 mg total) by mouth every 6 (six) hours as needed for pain for up to 7 days. 28 tablet 0   Vitamin D , Ergocalciferol , (DRISDOL ) 1.25 MG (50000 UNIT) CAPS capsule Take 1 capsule (50,000 Units total) by mouth every 7 (seven) days. 7 capsule 0   amoxicillin  (AMOXIL ) 500 MG capsule Take 1 capsule (500 mg total) by mouth 4 (four) times daily until all are taken beginning the morning of surgery (Patient not taking: Reported on 11/09/2023) 40 capsule 0   clopidogrel  (PLAVIX ) 75 MG tablet Take 1 tablet (75 mg total) by mouth daily. (Patient not taking: Reported on 11/09/2023) 30 tablet 6   erythromycin  ophthalmic ointment Place 1 Application into the right eye 3 (three) times daily for 10 days. (Patient not taking: Reported on 11/09/2023) 3.5 g 0   levofloxacin  (LEVAQUIN ) 500 MG tablet Take 1 tablet (500 mg total) by mouth daily for 10 days. (Patient not taking: Reported on 11/09/2023) 10 tablet 0   lisinopril  (ZESTRIL ) 10 MG tablet Take 1 tablet by mouth daily. (Patient not taking: Reported on 11/09/2023)     omeprazole  (PRILOSEC) 40 MG capsule Take by mouth.     prednisoLONE  acetate (PRED FORTE ) 1 % ophthalmic suspension Instill one (1) drop into left eye four (4) times a day as directed. Shake well before use. 5 mL 1   No current facility-administered medications for this visit.     PHYSICAL EXAMINATION: ECOG PERFORMANCE  STATUS: 1 - Symptomatic but completely ambulatory Vitals:   11/09/23 1325  BP: (!) 121/41  Pulse: (!) 57  Resp: 18  Temp: (!) 97.5 F (36.4 C)  SpO2: 99%   Filed Weights   11/09/23 1325  Weight: 139 lb 4.8 oz (63.2 kg)    Physical Exam Constitutional:      General: He is not in acute distress. HENT:     Head: Normocephalic and atraumatic.  Eyes:     General: No scleral icterus. Cardiovascular:     Rate and Rhythm: Normal rate and regular rhythm.     Heart sounds: Normal heart sounds.  Pulmonary:     Effort: Pulmonary  effort is normal. No respiratory distress.     Breath sounds: No wheezing.  Abdominal:     General: Bowel sounds are normal. There is no distension.     Palpations: Abdomen is soft.  Musculoskeletal:        General: No deformity. Normal range of motion.     Cervical back: Normal range of motion and neck supple.  Skin:    General: Skin is warm and dry.     Findings: No erythema or rash.  Neurological:     Mental Status: He is alert and oriented to person, place, and time. Mental status is at baseline.     Cranial Nerves: No cranial nerve deficit.  Psychiatric:        Mood and Affect: Mood normal.     LABORATORY DATA:  I have reviewed the data as listed    Latest Ref Rng & Units 10/28/2023    1:17 PM 01/12/2023    1:05 PM 07/03/2022   12:05 PM  CBC  WBC 4.0 - 10.5 K/uL 8.1  7.7  8.0   Hemoglobin 13.0 - 17.0 g/dL 87.0  86.5  86.1   Hematocrit 39.0 - 52.0 % 40.0  42.4  44.4   Platelets 150 - 400 K/uL 200  200  214       Latest Ref Rng & Units 10/28/2023    1:17 PM 01/12/2023    1:05 PM 07/03/2022   12:05 PM  CMP  Glucose 70 - 99 mg/dL 767  766  852   BUN 8 - 23 mg/dL 18  22  14    Creatinine 0.61 - 1.24 mg/dL 8.88  8.90  8.94   Sodium 135 - 145 mmol/L 135  138  136   Potassium 3.5 - 5.1 mmol/L 4.0  4.1  4.1   Chloride 98 - 111 mmol/L 105  108  104   CO2 22 - 32 mmol/L 23  24  24    Calcium  8.9 - 10.3 mg/dL 8.4  8.1  8.4   Total Protein 6.5 - 8.1 g/dL 6.1  6.1  6.6   Total Bilirubin 0.0 - 1.2 mg/dL 0.8  0.5  0.6   Alkaline Phos 38 - 126 U/L 68  61  76   AST 15 - 41 U/L 26  35  43   ALT 0 - 44 U/L 30  41  38      Iron /TIBC/Ferritin/ %Sat    Component Value Date/Time   IRON  90 01/12/2023 1305   TIBC 318 01/12/2023 1305   FERRITIN 72 01/12/2023 1305   IRONPCTSAT 28 01/12/2023 1305      RADIOGRAPHIC STUDIES: I have personally reviewed the radiological images as listed and agreed with the findings in the report. CT CHEST ABDOMEN PELVIS W CONTRAST Result Date:  11/05/2023 EXAM:  CT  CHEST ABDOMEN PELVIS WITH IV CONTRAST INDICATION:  History of esophageal carcinoma TECHNIQUE: Spiral CT scanning was performed through the chest, abdomen and pelvis after the patient received IV contrast. COMPARISON: 01/12/2023 FINDINGS: The cardiac size is within normal limits. There is no thoracic aortic aneurysm. No filling defects are identified in the central pulmonary arteries. A small left thyroid  nodule is present which does not require follow-up. There are stable postsurgical changes from esophagectomy and gastric pull-through. No mass or adenopathy has developed in the chest. There are no pleural or pericardial effusions. There is redemonstration of old inflammatory disease involving the right lung with volume loss, scarring, pleural thickening and inflammatory nodules. No new or enlarging nodules are identified. There is no significant abnormality identified in the liver, spleen, pancreas and gallbladder. The bilobed left upper quadrant fluid collection shows a mild decrease in size. It currently measures 5.5 x 2.9 cm (previously 6.0 x 3.2 cm). A small nonobstructive left renal calculus is present. There are small renal cysts which do not require follow-up. A stable cortical defect is present in the left lateral kidney, most likely from old infection. There are no adrenal masses. The abdominal bowel loops are unremarkable. There is no evidence of ascites or adenopathy. No abdominal aortic aneurysm is present. There is stable enlargement of the prostate. The bladder has a normal appearance. There is no inguinal hernia. The pelvic bowel loops have a normal appearance. The appendix has a normal appearance. There is no fracture or bone destruction. IMPRESSION: 1. No evidence of tumor recurrence or metastatic disease. 2. Stable chronic right lung disease with volume loss. 3. Additional benign nonacute findings as described above. Please note that CT scanning at this site utilizes multiple  dose reduction techniques, including automatic exposure control, adjustment of the MAA and/or KVP according to the patient's size, and use of iterative reconstruction. Electronically signed by: Eddy Oar MD 11/05/2023 10:41 AM EDT RP Workstation: [id number]   VAS US  CAROTID Result Date: 09/03/2023 Carotid Arterial Duplex Study Patient Name:  AIKEN WITHEM  Date of Exam:   09/01/2023 Medical Rec #: [id number]       Accession #:    [id number] Date of Birth: 02-07-1946      Patient Gender: M Patient Age:   89 years Exam Location:  [facility] Procedure:      VAS US  CAROTID Referring Phys: SELINDA GU --------------------------------------------------------------------------------  Indications:   Carotid artery disease and Right stent. Risk Factors:  Hypertension, hyperlipidemia, past history of smoking, coronary                artery disease. Other Factors: 07/04/2021: Placement of a 9 mm proximal, 7 mm distal, 4 cm long                exact stent with the use of the NAV 6 embolic protection device                in the Right Carotid Artery. Performing Technologist: Donnice Charnley RVT  Examination Guidelines: A complete evaluation includes B-mode imaging, spectral Doppler, color Doppler, and power Doppler as needed of all accessible portions of each vessel. Bilateral testing is considered an integral part of a complete examination. Limited examinations for reoccurring indications may be performed as noted.  Right Carotid Findings: +----------+--------+--------+--------+------------------+-------------+           PSV cm/sEDV cm/sStenosisPlaque DescriptionComments      +----------+--------+--------+--------+------------------+-------------+ CCA Prox  97      23                                              +----------+--------+--------+--------+------------------+-------------+  CCA Mid   63      11                                               +----------+--------+--------+--------+------------------+-------------+ CCA Distal79      18                                stent         +----------+--------+--------+--------+------------------+-------------+ ICA Prox  78      10                                stent         +----------+--------+--------+--------+------------------+-------------+ ICA Mid   293     94      60-79%                    tortuous      +----------+--------+--------+--------+------------------+-------------+ ICA Distal255     33                                              +----------+--------+--------+--------+------------------+-------------+ ECA       137     15                                through stent +----------+--------+--------+--------+------------------+-------------+ +----------+--------+-------+----------------+-------------------+           PSV cm/sEDV cmsDescribe        Arm Pressure (mmHG) +----------+--------+-------+----------------+-------------------+ [id number]            Multiphasic, WNL                    +----------+--------+-------+----------------+-------------------+ +---------+--------+--+--------+--+---------+ VertebralPSV cm/s54EDV cm/s15Antegrade +---------+--------+--+--------+--+---------+  Right Stent(s): +---------------+--------+--------+---------------+--------+--------+ Mid CCA to ICA PSV cm/sEDV cm/sStenosis       WaveformComments +---------------+--------+--------+---------------+--------+--------+ Prox to Stent  75      15                                      +---------------+--------+--------+---------------+--------+--------+ Proximal Stent 81      12                                      +---------------+--------+--------+---------------+--------+--------+ Mid Stent      72      13                                      +---------------+--------+--------+---------------+--------+--------+ Distal Stent   51       10                                      +---------------+--------+--------+---------------+--------+--------+ Distal to Stent235     42      50-75% stenosis                 +---------------+--------+--------+---------------+--------+--------+  Tortuous vessel at distal end of stent  Left Carotid Findings: +----------+--------+--------+--------+-----------------------+--------+           PSV cm/sEDV cm/sStenosisPlaque Description     Comments +----------+--------+--------+--------+-----------------------+--------+ CCA Prox  139     23                                              +----------+--------+--------+--------+-----------------------+--------+ CCA Mid   93      15                                              +----------+--------+--------+--------+-----------------------+--------+ CCA Distal115     20                                              +----------+--------+--------+--------+-----------------------+--------+ ICA Prox  259     56      40-59%  smooth and heterogenous         +----------+--------+--------+--------+-----------------------+--------+ ICA Mid   112     30                                              +----------+--------+--------+--------+-----------------------+--------+ ICA Distal123     30                                              +----------+--------+--------+--------+-----------------------+--------+ ECA       111     6                                               +----------+--------+--------+--------+-----------------------+--------+ +----------+--------+--------+----------------+-------------------+           PSV cm/sEDV cm/sDescribe        Arm Pressure (mmHG) +----------+--------+--------+----------------+-------------------+ [id number]             Multiphasic, WNL                    +----------+--------+--------+----------------+-------------------+ +---------+--------+--+--------+-+---------+  VertebralPSV cm/s39EDV cm/s8Antegrade +---------+--------+--+--------+-+---------+   Summary: Right Carotid: Velocities in the right ICA are consistent with a 60-79%                stenosis. Left Carotid: Velocities in the left ICA are consistent with a 40-59% stenosis. Vertebrals:  Bilateral vertebral arteries demonstrate antegrade flow. Subclavians: Normal flow hemodynamics were seen in bilateral subclavian              arteries. *See table(s) above for measurements and observations.  Electronically signed by Selinda Gu MD on 09/03/2023 at 8:35:18 AM.    Final

## 2023-11-09 NOTE — Assessment & Plan Note (Addendum)
#  Esophageal adenocarcinoma, Stage IIB Patient is status post esophagectomy, adjuvant chemotherapy includes 1 cycle of Xeloda /oxaliplatin .  Additional chemotherapy was not completed due to multiple hospitalization due to infection and back surgery. Labs are reviewed and discussed with patient June 2025 CT no evidence of recurrence.  He is 4+ year after surgery Continue surveillance.  Obtain CT chest abdomen pelvis in June 2026

## 2023-11-09 NOTE — Assessment & Plan Note (Signed)
Continue calcium 1200mg  supplementation.

## 2023-11-09 NOTE — Assessment & Plan Note (Signed)
Stable on CT scan

## 2023-11-12 DIAGNOSIS — I480 Paroxysmal atrial fibrillation: Secondary | ICD-10-CM | POA: Diagnosis not present

## 2023-11-12 DIAGNOSIS — R001 Bradycardia, unspecified: Secondary | ICD-10-CM | POA: Diagnosis not present

## 2023-11-12 DIAGNOSIS — E782 Mixed hyperlipidemia: Secondary | ICD-10-CM | POA: Diagnosis not present

## 2023-11-12 DIAGNOSIS — R55 Syncope and collapse: Secondary | ICD-10-CM | POA: Diagnosis not present

## 2023-11-12 DIAGNOSIS — I2510 Atherosclerotic heart disease of native coronary artery without angina pectoris: Secondary | ICD-10-CM | POA: Diagnosis not present

## 2023-11-12 DIAGNOSIS — D649 Anemia, unspecified: Secondary | ICD-10-CM | POA: Diagnosis not present

## 2023-11-12 DIAGNOSIS — I160 Hypertensive urgency: Secondary | ICD-10-CM | POA: Diagnosis not present

## 2023-11-12 DIAGNOSIS — I10 Essential (primary) hypertension: Secondary | ICD-10-CM | POA: Diagnosis not present

## 2023-11-12 DIAGNOSIS — R0602 Shortness of breath: Secondary | ICD-10-CM | POA: Diagnosis not present

## 2023-11-18 ENCOUNTER — Other Ambulatory Visit: Payer: Self-pay

## 2023-11-18 MED ORDER — LISINOPRIL 10 MG PO TABS
10.0000 mg | ORAL_TABLET | Freq: Every day | ORAL | 3 refills | Status: DC
  Filled 2023-11-18: qty 30, 30d supply, fill #0
  Filled 2023-12-14: qty 30, 30d supply, fill #1
  Filled 2024-01-15: qty 30, 30d supply, fill #2
  Filled 2024-02-15: qty 30, 30d supply, fill #3

## 2023-12-10 ENCOUNTER — Ambulatory Visit: Payer: Medicare HMO | Admitting: Dermatology

## 2023-12-21 ENCOUNTER — Inpatient Hospital Stay: Attending: Oncology

## 2023-12-21 DIAGNOSIS — Z452 Encounter for adjustment and management of vascular access device: Secondary | ICD-10-CM | POA: Insufficient documentation

## 2023-12-21 DIAGNOSIS — Z8501 Personal history of malignant neoplasm of esophagus: Secondary | ICD-10-CM | POA: Diagnosis not present

## 2023-12-28 ENCOUNTER — Emergency Department
Admission: EM | Admit: 2023-12-28 | Discharge: 2023-12-28 | Disposition: A | Discharge status: Home / Self Care | Attending: Emergency Medicine | Admitting: Emergency Medicine

## 2023-12-28 ENCOUNTER — Other Ambulatory Visit: Payer: Self-pay

## 2023-12-28 ENCOUNTER — Emergency Department

## 2023-12-28 ENCOUNTER — Encounter: Payer: Self-pay | Admitting: Emergency Medicine

## 2023-12-28 DIAGNOSIS — I4891 Unspecified atrial fibrillation: Secondary | ICD-10-CM | POA: Diagnosis not present

## 2023-12-28 DIAGNOSIS — I10 Essential (primary) hypertension: Secondary | ICD-10-CM | POA: Diagnosis not present

## 2023-12-28 DIAGNOSIS — Z7901 Long term (current) use of anticoagulants: Secondary | ICD-10-CM | POA: Diagnosis not present

## 2023-12-28 DIAGNOSIS — E119 Type 2 diabetes mellitus without complications: Secondary | ICD-10-CM | POA: Insufficient documentation

## 2023-12-28 DIAGNOSIS — I2510 Atherosclerotic heart disease of native coronary artery without angina pectoris: Secondary | ICD-10-CM | POA: Diagnosis not present

## 2023-12-28 DIAGNOSIS — Z8501 Personal history of malignant neoplasm of esophagus: Secondary | ICD-10-CM | POA: Insufficient documentation

## 2023-12-28 DIAGNOSIS — M47812 Spondylosis without myelopathy or radiculopathy, cervical region: Secondary | ICD-10-CM | POA: Diagnosis not present

## 2023-12-28 DIAGNOSIS — R42 Dizziness and giddiness: Secondary | ICD-10-CM | POA: Diagnosis not present

## 2023-12-28 DIAGNOSIS — M50822 Other cervical disc disorders at C5-C6 level: Secondary | ICD-10-CM | POA: Diagnosis not present

## 2023-12-28 DIAGNOSIS — R55 Syncope and collapse: Secondary | ICD-10-CM | POA: Diagnosis not present

## 2023-12-28 DIAGNOSIS — M4802 Spinal stenosis, cervical region: Secondary | ICD-10-CM | POA: Diagnosis not present

## 2023-12-28 LAB — COMPREHENSIVE METABOLIC PANEL WITH GFR
ALT: 34 U/L (ref 0–44)
AST: 25 U/L (ref 15–41)
Albumin: 3.5 g/dL (ref 3.5–5.0)
Alkaline Phosphatase: 54 U/L (ref 38–126)
Anion gap: 9 (ref 5–15)
BUN: 16 mg/dL (ref 8–23)
CO2: 26 mmol/L (ref 22–32)
Calcium: 8.6 mg/dL — ABNORMAL LOW (ref 8.9–10.3)
Chloride: 105 mmol/L (ref 98–111)
Creatinine, Ser: 1.04 mg/dL (ref 0.61–1.24)
GFR, Estimated: >60 mL/min (ref >60)
Glucose, Bld: 162 mg/dL — ABNORMAL HIGH (ref 70–99)
Potassium: 4.2 mmol/L (ref 3.5–5.1)
Sodium: 140 mmol/L (ref 135–145)
Total Bilirubin: 0.8 mg/dL (ref 0.0–1.2)
Total Protein: 6.4 g/dL — ABNORMAL LOW (ref 6.5–8.1)

## 2023-12-28 LAB — CBC
HCT: 46.2 % (ref 39.0–52.0)
Hemoglobin: 14.4 g/dL (ref 13.0–17.0)
MCH: 29.1 pg (ref 26.0–34.0)
MCHC: 31.2 g/dL (ref 30.0–36.0)
MCV: 93.5 fL (ref 80.0–100.0)
Platelets: 226 K/uL (ref 150–400)
RBC: 4.94 MIL/uL (ref 4.22–5.81)
RDW: 13.2 % (ref 11.5–15.5)
WBC: 8.9 K/uL (ref 4.0–10.5)
nRBC: 0 % (ref 0.0–0.2)

## 2023-12-28 LAB — TROPONIN I (HIGH SENSITIVITY)
Troponin I (High Sensitivity): 7 ng/L (ref <18)
Troponin I (High Sensitivity): 10 ng/L (ref <18)

## 2023-12-28 LAB — PROTIME-INR
INR: 1 (ref 0.8–1.2)
Prothrombin Time: 13.5 s (ref 11.4–15.2)

## 2023-12-28 NOTE — ED Notes (Signed)
 ED Provider at bedside.

## 2023-12-28 NOTE — ED Provider Notes (Signed)
 [facility] Provider Note    Event Date/Time   First MD Initiated Contact with Patient 12/28/23 1516     (approximate)   History   Chief Complaint Loss of Consciousness   HPI  Blake [name] is a 78 y.o. male with past medical history of hypertension, diabetes, CAD, atrial fibrillation on Eliquis , esophageal cancer, and iron  deficiency anemia who presents to the ED complaining of syncope.  Patient reports that he had just gotten up this morning when he began to feel dizzy and lightheaded.  He then lost consciousness and fell to the ground, believes he struck his head on the kitchen floor.  He denies any associated chest pain or shortness of breath, has been feeling well since the incident other than some mild headache and neck pain.  He denies any numbness or weakness in his extremities, has not had any vision changes or speech changes.  He reports similar near syncopal episodes 3-4 times over the past 2 years, but has not been evaluated for this.     Physical Exam   Triage Vital Signs: ED Triage Vitals  Encounter Vitals Group     BP 12/28/23 1225 (!) 146/81     Girls Systolic BP Percentile --      Girls Diastolic BP Percentile --      Boys Systolic BP Percentile --      Boys Diastolic BP Percentile --      Pulse Rate 12/28/23 1225 74     Resp 12/28/23 1225 17     Temp 12/28/23 1225 98.3 F (36.8 C)     Temp Source 12/28/23 1225 Oral     SpO2 12/28/23 1225 96 %     Weight 12/28/23 1226 145 lb (65.8 kg)     Height 12/28/23 1226 5' 5 (1.651 m)     Head Circumference --      Peak Flow --      Pain Score 12/28/23 1226 0     Pain Loc --      Pain Education --      Exclude from Growth Chart --     Most recent vital signs: Vitals:   12/28/23 1618 12/28/23 1730  BP: (!) 152/59 (!) 128/113  Pulse: 61 (!) 55  Resp: 12 16  Temp: 97.6 F (36.4 C)   SpO2: 97% 99%    Constitutional: Alert and oriented. Eyes: Conjunctivae are normal. Head: Left  temporal abrasion noted, no hematomas or step-offs. Nose: No congestion/rhinnorhea. Mouth/Throat: Mucous membranes are moist.  Neck: Midline cervical spine tenderness to palpation. Cardiovascular: Normal rate, regular rhythm. Grossly normal heart sounds.  2+ radial pulses bilaterally. Respiratory: Normal respiratory effort.  No retractions. Lungs CTAB. Gastrointestinal: Soft and nontender. No distention. Musculoskeletal: No lower extremity tenderness nor edema.  Neurologic:  Normal speech and language. No gross focal neurologic deficits are appreciated.    ED Results / Procedures / Treatments   Labs (all labs ordered are listed, but only abnormal results are displayed) Labs Reviewed  COMPREHENSIVE METABOLIC PANEL WITH GFR - Abnormal; Notable for the following components:      Result Value   Glucose, Bld 162 (*)    Calcium  8.6 (*)    Total Protein 6.4 (*)    All other components within normal limits  CBC  PROTIME-INR  TROPONIN I (HIGH SENSITIVITY)  TROPONIN I (HIGH SENSITIVITY)     EKG  ED ECG REPORT I, Carlin Palin, the attending physician, personally viewed and interpreted this ECG.  Date: 12/28/2023  EKG Time: 12:27  Rate: 69  Rhythm: normal sinus rhythm  Axis: Normal  Intervals:none  ST&T Change: None  RADIOLOGY CT head reviewed and interpreted by me with no hemorrhage or midline shift.  PROCEDURES:  Critical Care performed: No  Procedures   MEDICATIONS ORDERED IN ED: Medications - No data to display   IMPRESSION / MDM / ASSESSMENT AND PLAN / ED COURSE  I reviewed the triage vital signs and the nursing notes.                              78 y.o. male with past medical history of hypertension, diabetes, CAD, atrial fibrillation on Eliquis , esophageal cancer, and iron  deficiency anemia who presents to the ED following syncopal episode where he fell and hit his head.  Patient's presentation is most consistent with acute presentation with potential  threat to life or bodily function.  Differential diagnosis includes, but is not limited to, arrhythmia, ACS, orthostatic hypotension, vasovagal episode, dehydration, electrolyte abnormality, AKI.  Patient nontoxic-appearing and in no acute distress, vital signs are unremarkable.  EKG shows no evidence of arrhythmia or ischemia, will observe on cardiac monitor and check troponin.  Labs without significant anemia, leukocytosis, electrolyte abnormality, or AKI.  CT head is negative for acute process and no focal neurologic deficits to suggest stroke.  Will check CT of his cervical spine given midline tenderness, but no evidence of traumatic injury to his trunk or extremities.  CT cervical spine is negative for acute process, 2 sets of troponin within normal limits and no events noted on cardiac monitor.  Patient was offered admission to the hospital for further syncope workup, but prefers to follow-up with his cardiologist as an outpatient.  He was counseled to return to the ED for new or worsening symptoms, patient agrees with plan.      FINAL CLINICAL IMPRESSION(S) / ED DIAGNOSES   Final diagnoses:  Syncope, unspecified syncope type     Rx / DC Orders   ED Discharge Orders     None        Note:  This document was prepared using Dragon voice recognition software and may include unintentional dictation errors.   [name], MD 12/28/23 JEROLYN

## 2023-12-28 NOTE — ED Notes (Signed)
 CCMD notified of patient monitoring order

## 2023-12-28 NOTE — ED Triage Notes (Signed)
 Patient to ED via POV for syncope. PT reports he was making coffee when he became dizzy and passed out- hit head on cabinet and floor. On blood thinners. Concerned for stroke- denies new unilateral weakness or numbness. 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 No facial droop, speech or visual changes. States generalized weakness. Denies pain.

## 2023-12-29 ENCOUNTER — Ambulatory Visit: Admitting: Dermatology

## 2023-12-29 ENCOUNTER — Encounter: Payer: Self-pay | Admitting: Dermatology

## 2023-12-29 ENCOUNTER — Other Ambulatory Visit: Payer: Self-pay

## 2023-12-29 DIAGNOSIS — Z79899 Other long term (current) drug therapy: Secondary | ICD-10-CM

## 2023-12-29 DIAGNOSIS — Z85828 Personal history of other malignant neoplasm of skin: Secondary | ICD-10-CM

## 2023-12-29 DIAGNOSIS — Z7189 Other specified counseling: Secondary | ICD-10-CM

## 2023-12-29 DIAGNOSIS — W908XXA Exposure to other nonionizing radiation, initial encounter: Secondary | ICD-10-CM

## 2023-12-29 DIAGNOSIS — S51812A Laceration without foreign body of left forearm, initial encounter: Secondary | ICD-10-CM | POA: Diagnosis not present

## 2023-12-29 DIAGNOSIS — S0512XA Contusion of eyeball and orbital tissues, left eye, initial encounter: Secondary | ICD-10-CM

## 2023-12-29 DIAGNOSIS — L820 Inflamed seborrheic keratosis: Secondary | ICD-10-CM | POA: Diagnosis not present

## 2023-12-29 DIAGNOSIS — Z1283 Encounter for screening for malignant neoplasm of skin: Secondary | ICD-10-CM | POA: Diagnosis not present

## 2023-12-29 DIAGNOSIS — L570 Actinic keratosis: Secondary | ICD-10-CM

## 2023-12-29 DIAGNOSIS — Z8589 Personal history of malignant neoplasm of other organs and systems: Secondary | ICD-10-CM

## 2023-12-29 DIAGNOSIS — L219 Seborrheic dermatitis, unspecified: Secondary | ICD-10-CM

## 2023-12-29 DIAGNOSIS — D229 Melanocytic nevi, unspecified: Secondary | ICD-10-CM

## 2023-12-29 DIAGNOSIS — S41112A Laceration without foreign body of left upper arm, initial encounter: Secondary | ICD-10-CM

## 2023-12-29 DIAGNOSIS — L814 Other melanin hyperpigmentation: Secondary | ICD-10-CM | POA: Diagnosis not present

## 2023-12-29 DIAGNOSIS — D692 Other nonthrombocytopenic purpura: Secondary | ICD-10-CM

## 2023-12-29 DIAGNOSIS — L578 Other skin changes due to chronic exposure to nonionizing radiation: Secondary | ICD-10-CM | POA: Diagnosis not present

## 2023-12-29 DIAGNOSIS — R296 Repeated falls: Secondary | ICD-10-CM

## 2023-12-29 DIAGNOSIS — L821 Other seborrheic keratosis: Secondary | ICD-10-CM

## 2023-12-29 MED ORDER — DILTIAZEM HCL ER COATED BEADS 180 MG PO CP24
180.0000 mg | ORAL_CAPSULE | Freq: Every day | ORAL | 1 refills | Status: AC
  Filled 2023-12-29: qty 90, 90d supply, fill #0
  Filled 2024-04-15: qty 90, 90d supply, fill #1

## 2023-12-29 MED ORDER — KETOCONAZOLE 2 % EX CREA
TOPICAL_CREAM | CUTANEOUS | 3 refills | Status: AC
  Filled 2023-12-29: qty 30, 30d supply, fill #0

## 2023-12-29 MED ORDER — HYDROCORTISONE 2.5 % EX CREA
TOPICAL_CREAM | CUTANEOUS | 3 refills | Status: AC
  Filled 2023-12-29: qty 30, 30d supply, fill #0

## 2023-12-29 NOTE — Progress Notes (Unsigned)
 Follow-Up Visit   Subjective  Blake [name] is a 78 y.o. male who presents for the following: Skin Cancer Screening and Full Body Skin Exam. Hx of SCC. Hx of multiple BCCs. Hx of AKs.   C/O scaly patches at central face, bearded area. Comes and goes.   The patient presents for Total-Body Skin Exam (TBSE) for skin cancer screening and mole check. The patient has spots, moles and lesions to be evaluated, some may be new or changing and the patient may have concern these could be cancer.  The following portions of the chart were reviewed this encounter and updated as appropriate: medications, allergies, medical history  Review of Systems:  No other skin or systemic complaints except as noted in HPI or Assessment and Plan.  Objective  Well appearing patient in no apparent distress; mood and affect are within normal limits.  A full examination was performed including scalp, head, eyes, ears, nose, lips, neck, chest, axillae, abdomen, back, buttocks, bilateral upper extremities, bilateral lower extremities, hands, feet, fingers, toes, fingernails, and toenails. All findings within normal limits unless otherwise noted below.   Relevant physical exam findings are noted in the Assessment and Plan.        Left periorbital Purpuric patches at left periorbital L side of nose x1 Erythematous thin papules/macules with gritty scale.  Right Temple x1, R flank x1 (2) Erythematous keratotic or waxy stuck-on papule or plaque. Left Forearm - Posterior Laceration   Assessment & Plan   SKIN CANCER SCREENING PERFORMED TODAY.  HISTORY OF BASAL CELL CARCINOMA OF THE SKIN - No evidence of recurrence today - Recommend regular full body skin exams - Recommend daily broad spectrum sunscreen SPF 30+ to sun-exposed areas, reapply every 2 hours as needed.  - Call if any new or changing lesions are noted between office visits  HISTORY OF SQUAMOUS CELL CARCINOMA OF THE SKIN - No evidence of  recurrence today - No lymphadenopathy - Recommend regular full body skin exams - Recommend daily broad spectrum sunscreen SPF 30+ to sun-exposed areas, reapply every 2 hours as needed.  - Call if any new or changing lesions are noted between office visits   ACTINIC DAMAGE - Chronic condition, secondary to cumulative UV/sun exposure - diffuse scaly erythematous macules with underlying dyspigmentation - Recommend daily broad spectrum sunscreen SPF 30+ to sun-exposed areas, reapply every 2 hours as needed.  - Staying in the shade or wearing long sleeves, sun glasses (UVA+UVB protection) and wide brim hats (4-inch brim around the entire circumference of the hat) are also recommended for sun protection.  - Call for new or changing lesions.  LENTIGINES, SEBORRHEIC KERATOSES, HEMANGIOMAS - Benign normal skin lesions - Benign-appearing - Call for any changes  MELANOCYTIC NEVI - Tan-brown and/or pink-flesh-colored symmetric macules and papules - Benign appearing on exam today - Observation - Call clinic for new or changing moles - Recommend daily use of broad spectrum spf 30+ sunscreen to sun-exposed areas.    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 Condition is bothersome/symptomatic for patient. Currently flared. Seborrheic Dermatitis is a chronic persistent rash characterized by pinkness and scaling most commonly of the mid face but also can occur on the scalp (dandruff), ears; mid chest, mid back and groin.  It tends to be exacerbated by stress and cooler weather.  People who have neurologic disease may experience new onset or exacerbation of existing seborrheic  dermatitis.  The condition is not curable but treatable and can be controlled. Treatment Plan: Start Ketoconazole  2% cream 3 nights a week, Monday, Wednesday and Fridays Start Hydrocortisone  2.5% cream 3 nights a  week, Tuesday, Thursday and Saturdays    Purpura - Chronic; persistent and recurrent.  Treatable, but not curable. - Violaceous macules and patches - Benign - Related to trauma, age, sun damage and/or use of blood thinners, chronic use of topical and/or oral steroids - Observe - Can use OTC arnica containing moisturizer such as Dermend Bruise Formula if desired - Call for worsening or other concerns  ECCHYMOSIS OF LEFT EYE, INITIAL ENCOUNTER Left periorbital From traumatic fall.  Pt has been evaluated thoroughly at hospital and OK.  No strike. Observe.   AK (ACTINIC KERATOSIS) L side of nose x1 Actinic keratoses are precancerous spots that appear secondary to cumulative UV radiation exposure/sun exposure over time. They are chronic with expected duration over 1 year. A portion of actinic keratoses will progress to squamous cell carcinoma of the skin. It is not possible to reliably predict which spots will progress to skin cancer and so treatment is recommended to prevent development of skin cancer.  Recommend daily broad spectrum sunscreen SPF 30+ to sun-exposed areas, reapply every 2 hours as needed.  Recommend staying in the shade or wearing long sleeves, sun glasses (UVA+UVB protection) and wide brim hats (4-inch brim around the entire circumference of the hat). Call for new or changing lesions. Destruction of lesion - L side of nose x1 Complexity: simple   Destruction method: cryotherapy   Informed consent: discussed and consent obtained   Timeout:  patient name, date of birth, surgical site, and procedure verified Lesion destroyed using liquid nitrogen: Yes   Region frozen until ice ball extended beyond lesion: Yes   Outcome: patient tolerated procedure well with no complications   Post-procedure details: wound care instructions given   Additional details:  Prior to procedure, discussed risks of blister formation, small wound, skin dyspigmentation, or rare scar following  cryotherapy. Recommend Vaseline ointment to treated areas while healing.   INFLAMED SEBORRHEIC KERATOSIS (2) Right Temple x1, R flank x1 (2) Symptomatic, irritating, patient would like treated. Destruction of lesion - Right Temple x1, R flank x1 (2) Complexity: simple   Destruction method: cryotherapy   Informed consent: discussed and consent obtained   Timeout:  patient name, date of birth, surgical site, and procedure verified Lesion destroyed using liquid nitrogen: Yes   Region frozen until ice ball extended beyond lesion: Yes   Outcome: patient tolerated procedure well with no complications   Post-procedure details: wound care instructions given   Additional details:  Prior to procedure, discussed risks of blister formation, small wound, skin dyspigmentation, or rare scar following cryotherapy. Recommend Vaseline ointment to treated areas while healing.   LACERATION OF LEFT UPPER EXTREMITY, INITIAL ENCOUNTER Left Forearm - Posterior Secondary to fall. Continue wound care as directed.    Return in about 6 months (around 06/27/2024) for AK Follow Up, 1 year TBSE.  I, Kate Fought, CMA, am acting as scribe for Alm Rhyme, MD.   Documentation: I have reviewed the above documentation for accuracy and completeness, and I agree with the above.  Alm Rhyme, MD

## 2023-12-29 NOTE — Patient Instructions (Addendum)
 Start Ketoconazole  2% cream 3 nights a week, Monday, Wednesday and Fridays Start Hydrocortisone  2.5% cream 3 nights a week, Tuesday, Thursday and Saturdays as needed     Recommend daily broad spectrum sunscreen SPF 30+ to sun-exposed areas, reapply every 2 hours as needed. Call for new or changing lesions.  Staying in the shade or wearing long sleeves, sun glasses (UVA+UVB protection) and wide brim hats (4-inch brim around the entire circumference of the hat) are also recommended for sun protection.     Melanoma ABCDEs  Melanoma is the most dangerous type of skin cancer, and is the leading cause of death from skin disease.  You are more likely to develop melanoma if you: Have light-colored skin, light-colored eyes, or red or blond hair Spend a lot of time in the sun Tan regularly, either outdoors or in a tanning bed Have had blistering sunburns, especially during childhood Have a close family member who has had a melanoma Have atypical moles or large birthmarks  Early detection of melanoma is key since treatment is typically straightforward and cure rates are extremely high if we catch it early.   The first sign of melanoma is often a change in a mole or a new dark spot.  The ABCDE system is a way of remembering the signs of melanoma.  A for asymmetry:  The two halves do not match. B for border:  The edges of the growth are irregular. C for color:  A mixture of colors are present instead of an even brown color. D for diameter:  Melanomas are usually (but not always) greater than 6mm - the size of a pencil eraser. E for evolution:  The spot keeps changing in size, shape, and color.  Please check your skin once per month between visits. You can use a small mirror in front and a large mirror behind you to keep an eye on the back side or your body.   If you see any new or changing lesions before your next follow-up, please call to schedule a visit.  Please continue daily skin protection  including broad spectrum sunscreen SPF 30+ to sun-exposed areas, reapplying every 2 hours as needed when you're outdoors.   Staying in the shade or wearing long sleeves, sun glasses (UVA+UVB protection) and wide brim hats (4-inch brim around the entire circumference of the hat) are also recommended for sun protection.      Due to recent changes in healthcare laws, you may see results of your pathology and/or laboratory studies on MyChart before the doctors have had a chance to review them. We understand that in some cases there may be results that are confusing or concerning to you. Please understand that not all results are received at the same time and often the doctors may need to interpret multiple results in order to provide you with the best plan of care or course of treatment. Therefore, we ask that you please give us  2 business days to thoroughly review all your results before contacting the office for clarification. Should we see a critical lab result, you will be contacted sooner.   If You Need Anything After Your Visit  If you have any questions or concerns for your doctor, please call our main line at [phone] and press option 4 to reach your doctor's medical assistant. If no one answers, please leave a voicemail as directed and we will return your call as soon as possible. Messages left after 4 pm will be answered the following business  day.   You may also send us  a message via MyChart. We typically respond to MyChart messages within 1-2 business days.  For prescription refills, please ask your pharmacy to contact our office. Our fax number is [phone].  If you have an urgent issue when the clinic is closed that cannot wait until the next business day, you can page your doctor at the number below.    Please note that while we do our best to be available for urgent issues outside of office hours, we are not available 24/7.   If you have an urgent issue and are unable to reach  us , you may choose to seek medical care at your doctor's office, retail clinic, urgent care center, or emergency room.  If you have a medical emergency, please immediately call 911 or go to the emergency department.  Pager Numbers  - Dr. Hester: [phone]  - Dr. Jackquline: [phone]  - Dr. Claudene: [phone]   - Dr. Raymund: [phone]  In the event of inclement weather, please call our main line at [phone] for an update on the status of any delays or closures.  Dermatology Medication Tips: Please keep the boxes that topical medications come in in order to help keep track of the instructions about where and how to use these. Pharmacies typically print the medication instructions only on the boxes and not directly on the medication tubes.   If your medication is too expensive, please contact our office at [phone] option 4 or send us  a message through MyChart.   We are unable to tell what your co-pay for medications will be in advance as this is different depending on your insurance coverage. However, we may be able to find a substitute medication at lower cost or fill out paperwork to get insurance to cover a needed medication.   If a prior authorization is required to get your medication covered by your insurance company, please allow us  1-2 business days to complete this process.  Drug prices often vary depending on where the prescription is filled and some pharmacies may offer cheaper prices.  The website www.goodrx.com contains coupons for medications through different pharmacies. The prices here do not account for what the cost may be with help from insurance (it may be cheaper with your insurance), but the website can give you the price if you did not use any insurance.  - You can print the associated coupon and take it with your prescription to the pharmacy.  - You may also stop by our office during regular business hours and pick up a GoodRx coupon card.  - If  you need your prescription sent electronically to a different pharmacy, notify our office through [facility] or by phone at [phone] option 4.     Si Usted Necesita Algo Despus de Su Visita  Tambin puede enviarnos un mensaje a travs de Clinical cytogeneticist. Por lo general respondemos a los mensajes de MyChart en el transcurso de 1 a 2 das hbiles.  Para renovar recetas, por favor pida a su farmacia que se ponga en contacto con nuestra oficina. Randi lakes de fax es [place] [phone].  Si tiene un asunto urgente cuando la clnica est cerrada y que no puede esperar hasta el siguiente da hbil, puede llamar/localizar a su doctor(a) al nmero que aparece a continuacin.   Por favor, tenga en cuenta que aunque hacemos todo lo posible para estar disponibles para asuntos urgentes fuera del horario de oficina, no estamos disponibles las 24 horas  del da, los 7 [address]  [address] de la [place].   Si tiene un problema urgente y no puede comunicarse con nosotros, puede optar por buscar atencin mdica  en el consultorio de su doctor(a), en una clnica privada, en un centro de atencin urgente o en una sala de emergencias.  Si tiene Engineer, drilling, por favor llame inmediatamente al 911 o vaya a la sala de emergencias.  Nmeros de bper  - Dr. Hester: [phone]  - Dra. Jackquline: [phone]  - Dr. Claudene: [phone]  - Dra. Kitts: [phone]  En caso de inclemencias del [place], por favor llame a nuestra lnea principal al [phone] para una actualizacin sobre el estado de cualquier retraso o cierre.  Consejos para la medicacin en dermatologa: Por favor, guarde las cajas en las que vienen los medicamentos de uso tpico para ayudarle a seguir las instrucciones sobre dnde y cmo usarlos. Las farmacias generalmente imprimen las instrucciones del medicamento slo en las cajas y no directamente en los tubos del [place].   Si su medicamento es muy caro, por favor, pngase en contacto  con landry rieger llamando al [phone] y presione la opcin 4 o envenos un mensaje a travs de Clinical cytogeneticist.   No podemos decirle cul ser su copago por los medicamentos por adelantado ya que esto es diferente dependiendo de la cobertura de su seguro. Sin embargo, es posible que podamos encontrar un medicamento sustituto a Audiological scientist un formulario para que el seguro cubra el medicamento que se considera necesario.   Si se requiere una autorizacin previa para que su compaa de seguros malta su medicamento, por favor permtanos de 1 a 2 das hbiles para completar este proceso.  Los precios de los medicamentos varan con frecuencia dependiendo del Environmental consultant de dnde se surte la receta y alguna farmacias pueden ofrecer precios ms baratos.  El sitio web www.goodrx.com tiene cupones para medicamentos de Health and safety inspector. Los precios aqu no tienen en cuenta lo que podra costar con la ayuda del seguro (puede ser ms barato con su seguro), pero el sitio web puede darle el precio si no utiliz Tourist information centre manager.  - Puede imprimir el cupn correspondiente y llevarlo con su receta a la farmacia.  - Tambin puede pasar por nuestra oficina durante el horario de atencin regular y Education officer, museum una tarjeta de cupones de GoodRx.  - Si necesita que su receta se enve electrnicamente a una farmacia diferente, informe a nuestra oficina a travs de MyChart de [place] o por telfono llamando al [phone] y presione la opcin 4.

## 2023-12-30 ENCOUNTER — Encounter: Payer: Self-pay | Admitting: Dermatology

## 2024-01-04 ENCOUNTER — Other Ambulatory Visit: Payer: Self-pay

## 2024-01-04 MED ORDER — IBUPROFEN 800 MG PO TABS
800.0000 mg | ORAL_TABLET | Freq: Three times a day (TID) | ORAL | 1 refills | Status: DC | PRN
  Filled 2024-01-04: qty 60, 20d supply, fill #0

## 2024-01-05 ENCOUNTER — Other Ambulatory Visit: Payer: Self-pay

## 2024-02-10 ENCOUNTER — Other Ambulatory Visit: Payer: Self-pay

## 2024-02-10 DIAGNOSIS — I10 Essential (primary) hypertension: Secondary | ICD-10-CM | POA: Diagnosis not present

## 2024-02-10 DIAGNOSIS — I480 Paroxysmal atrial fibrillation: Secondary | ICD-10-CM | POA: Diagnosis not present

## 2024-02-10 DIAGNOSIS — Z79899 Other long term (current) drug therapy: Secondary | ICD-10-CM | POA: Diagnosis not present

## 2024-02-10 DIAGNOSIS — E119 Type 2 diabetes mellitus without complications: Secondary | ICD-10-CM | POA: Diagnosis not present

## 2024-02-10 DIAGNOSIS — G894 Chronic pain syndrome: Secondary | ICD-10-CM | POA: Diagnosis not present

## 2024-02-10 DIAGNOSIS — E782 Mixed hyperlipidemia: Secondary | ICD-10-CM | POA: Diagnosis not present

## 2024-02-10 MED ORDER — DESVENLAFAXINE SUCCINATE ER 50 MG PO TB24
50.0000 mg | ORAL_TABLET | Freq: Every day | ORAL | 11 refills | Status: AC
  Filled 2024-02-10: qty 30, 30d supply, fill #0

## 2024-02-15 ENCOUNTER — Inpatient Hospital Stay: Attending: Oncology

## 2024-02-15 DIAGNOSIS — Z8501 Personal history of malignant neoplasm of esophagus: Secondary | ICD-10-CM | POA: Diagnosis not present

## 2024-02-15 DIAGNOSIS — Z452 Encounter for adjustment and management of vascular access device: Secondary | ICD-10-CM | POA: Diagnosis not present

## 2024-02-22 DIAGNOSIS — Z9841 Cataract extraction status, right eye: Secondary | ICD-10-CM | POA: Diagnosis not present

## 2024-02-22 DIAGNOSIS — Z0100 Encounter for examination of eyes and vision without abnormal findings: Secondary | ICD-10-CM | POA: Diagnosis not present

## 2024-02-22 DIAGNOSIS — Z9842 Cataract extraction status, left eye: Secondary | ICD-10-CM | POA: Diagnosis not present

## 2024-02-22 DIAGNOSIS — H52223 Regular astigmatism, bilateral: Secondary | ICD-10-CM | POA: Diagnosis not present

## 2024-02-22 DIAGNOSIS — Z961 Presence of intraocular lens: Secondary | ICD-10-CM | POA: Diagnosis not present

## 2024-02-22 DIAGNOSIS — E119 Type 2 diabetes mellitus without complications: Secondary | ICD-10-CM | POA: Diagnosis not present

## 2024-03-01 ENCOUNTER — Encounter (INDEPENDENT_AMBULATORY_CARE_PROVIDER_SITE_OTHER): Payer: Self-pay | Admitting: Vascular Surgery

## 2024-03-01 ENCOUNTER — Other Ambulatory Visit: Payer: Self-pay

## 2024-03-01 ENCOUNTER — Ambulatory Visit (INDEPENDENT_AMBULATORY_CARE_PROVIDER_SITE_OTHER)

## 2024-03-01 ENCOUNTER — Ambulatory Visit (INDEPENDENT_AMBULATORY_CARE_PROVIDER_SITE_OTHER): Admitting: Vascular Surgery

## 2024-03-01 VITALS — BP 152/75 | HR 80 | Resp 17 | Ht 65.0 in | Wt 140.2 lb

## 2024-03-01 DIAGNOSIS — E119 Type 2 diabetes mellitus without complications: Secondary | ICD-10-CM | POA: Diagnosis not present

## 2024-03-01 DIAGNOSIS — E785 Hyperlipidemia, unspecified: Secondary | ICD-10-CM

## 2024-03-01 DIAGNOSIS — I6523 Occlusion and stenosis of bilateral carotid arteries: Secondary | ICD-10-CM

## 2024-03-01 DIAGNOSIS — C029 Malignant neoplasm of tongue, unspecified: Secondary | ICD-10-CM | POA: Diagnosis not present

## 2024-03-01 DIAGNOSIS — I10 Essential (primary) hypertension: Secondary | ICD-10-CM | POA: Diagnosis not present

## 2024-03-01 MED ORDER — CLOPIDOGREL BISULFATE 75 MG PO TABS
75.0000 mg | ORAL_TABLET | Freq: Every day | ORAL | 6 refills | Status: AC
  Filled 2024-03-01: qty 30, 30d supply, fill #0

## 2024-03-01 NOTE — Progress Notes (Signed)
 MRN : [id number]  Blake [name] is a 78 y.o. ([birth year]) male who presents with chief complaint of  Chief Complaint  Patient presents with   Follow-up    6 months + Carotid  .  History of Present Illness:   Discussed the use of AI scribe software for clinical note transcription with the patient, who gave verbal consent to proceed.  History of Present Illness Blake [name] is a 78 year old male with carotid artery disease who presents for follow-up.  He recently had a syncopal episode while fixing coffee, with associated dizziness. No cause was identified.  Did not have associated arm or leg weakness or numbness, speech or swallowing difficulty, or temporary monocular blindness  He previously underwent carotid artery stenting.  Today's carotid duplex ultrasound shows worsening velocities just above the stent in the high-grade 80-99% range, with left carotid stenosis in the 60-79% range. His prior radiation therapy increases his risk of restenosis.  He is taking Eliquis  but is not on Plavix  or aspirin . He has had no recent stroke or TIA symptoms, including limb weakness, numbness, facial droop, or speech difficulty.    Results RADIOLOGY Carotid Ultrasound: Velocity above stent in high-grade range, 80-99%; left side 60-79%.  Current Outpatient Medications  Medication Sig Dispense Refill   amoxicillin  (AMOXIL ) 500 MG capsule Take 1 capsule (500 mg total) by mouth 4 (four) times daily until all are taken beginning the morning of surgery 40 capsule 0   apixaban  (ELIQUIS ) 5 MG TABS tablet Take 1 tablet (5 mg total) by mouth every 12 (twelve) hours. 60 tablet 5   azelastine  (ASTELIN ) 0.1 % nasal spray Place 2 sprays into both nostrils 2 (two) times daily. Use in each nostril as directed 30 mL 2   chlorhexidine  (PERIDEX ) 0.12 % solution Rinse with 15 mLs by Mouth Rinse route 2 (two) times daily and spit out. 473 mL 12   clopidogrel  (PLAVIX ) 75 MG tablet Take 1 tablet (75 mg total) by  mouth daily. 30 tablet 6   cyanocobalamin  1000 MCG tablet Take 1 tablet (1,000 mcg total) by mouth daily. 90 tablet 0   desvenlafaxine  (PRISTIQ ) 50 MG 24 hr tablet Take 1 tablet (50 mg total) by mouth daily. 30 tablet 11   diltiazem  (CARDIZEM  CD) 180 MG 24 hr capsule Take 1 capsule (180 mg total) by mouth daily. Please hold until you see your cardiologist 90 capsule 1   erythromycin  ophthalmic ointment Place 1 Application into the right eye 3 (three) times daily for 10 days. 3.5 g 0   ferrous sulfate  325 (65 FE) MG tablet Take 1 tablet (325 mg total) by mouth 2 (two) times daily with a meal. 60 tablet 3   fluocinonide -emollient (LIDEX -E) 0.05 % cream Apply topically 2 (two) times a day if needed. 180 g 3   gatifloxacin  (ZYMAXID ) 0.5 % SOLN Place 1 drop into the right eye 4 (four) times daily. Store upside down. 5 mL 1   gatifloxacin  (ZYMAXID ) 0.5 % SOLN Instill one (1) drop into the left eye four (4) times a day as directed. Store bottle upside down. 5 mL 1   hydrocortisone  2.5 % cream Apply to affected areas on face 3 nights a week, Tuesday, Thursday and Saturdays  as needed 30 g 3   ibuprofen  (ADVIL ) 800 MG tablet Take 1 tablet (800 mg total) by mouth every 8 (eight) hours as needed for pain. 60 tablet 1   ketoconazole  (NIZORAL ) 2 % cream Apply to affected areas  on face 3 nights a week, Monday, Wednesday and Fridays 30 g 3   ketorolac  (ACULAR ) 0.5 % ophthalmic solution Place 1 drop into the right eye 4 (four) times daily. 10 mL 1   ketorolac  (ACULAR ) 0.5 % ophthalmic solution Instill one (1) drop into the left eye four (4) times daily as directed. 10 mL 1   levofloxacin  (LEVAQUIN ) 500 MG tablet Take 1 tablet (500 mg total) by mouth daily for 10 days. 10 tablet 0   lisinopril  (ZESTRIL ) 10 MG tablet Take 1 tablet by mouth daily.     lisinopril  (ZESTRIL ) 10 MG tablet Take 1 tablet (10 mg total) by mouth daily. 30 tablet 3   methocarbamol  (ROBAXIN ) 500 MG tablet Take 1 tablet (500 mg total) by mouth  every 6 (six) hours as needed. 90 tablet 1   metoprolol  succinate (TOPROL -XL) 100 MG 24 hr tablet Take 2 tablets (200 mg total) by mouth daily. 180 tablet 1   mirtazapine  (REMERON ) 15 MG tablet Take 1 tablet (15 mg total) by mouth Nightly. 30 tablet 11   omeprazole  (PRILOSEC) 40 MG capsule Take by mouth.     omeprazole  (PRILOSEC) 40 MG capsule Take 1 capsule (40 mg total) by mouth daily. 90 capsule 1   prednisoLONE  acetate (PRED FORTE ) 1 % ophthalmic suspension Place 1 drop into the right eye 4 (four) times daily. Please shake before each use. 5 mL 1   prednisoLONE  acetate (PRED FORTE ) 1 % ophthalmic suspension Instill one (1) drop into left eye four (4) times a day as directed. Shake well before use. 5 mL 1   pseudoephedrine -guaifenesin  (MUCINEX  D) 60-600 MG 12 hr tablet Take 1 tablet by mouth every 12 (twelve) hours for 5 days 10 tablet 0   rosuvastatin  (CRESTOR ) 10 MG tablet Take 1 tablet (10 mg total) by mouth once daily 90 tablet 3   tamsulosin  (FLOMAX ) 0.4 MG CAPS capsule Take 1 capsule (0.4 mg total) by mouth once daily TAKE 30 MINUTES AFTER SAME MEAL EACH DAY 90 capsule 3   Tiotropium Bromide  Monohydrate (SPIRIVA  RESPIMAT) 2.5 MCG/ACT AERS Inhale 2 puffs into the lungs daily. 4 g 1   traMADol  (ULTRAM ) 50 MG tablet Take 1 tablet (50 mg total) by mouth every 6 (six) hours as needed for pain for up to 7 days. 28 tablet 0   Vitamin D , Ergocalciferol , (DRISDOL ) 1.25 MG (50000 UNIT) CAPS capsule Take 1 capsule (50,000 Units total) by mouth every 7 (seven) days. 7 capsule 0   No current facility-administered medications for this visit.    Past Medical History:  Diagnosis Date   Actinic keratosis    Arthritis    Back pain    with leg pain   Basal cell carcinoma 09/04/2009   R cheek 5.5 cm ant to earlobe - 12/26/2009 excision   Basal cell carcinoma 09/04/2009   R ant nasal alar rim   Basal cell carcinoma 07/31/2014   R mid brow   Basal cell carcinoma 07/30/2016   R ant nasal alar rim at  ant edge of BCC scar   Basal cell carcinoma 11/02/2019   L zygoma    Basal cell carcinoma 07/22/2021   left sideburn area, [facility]   Basal cell carcinoma 01/02/2022   Right posterior shoulder, [facility]   Basal cell carcinoma 01/02/2022   R posterior shoulder, [facility]   Basal cell carcinoma 01/02/2022   Right Supraclavicular Base of Neck, [facility]   Basal cell carcinoma 06/15/2023   Left mid cheek. Nodular pattern. Mohs with  Dr. Corey 07/14/23   Basal cell carcinoma 06/15/2023   Left preauricular. Nodular pattern. Mohs with Dr. Corey.   Basal cell carcinoma 06/15/2023   Left post auricular. Nodular pattern. Mohs with Dr. Corey.   Basal cell carcinoma 07/21/2023   Right cheek. Nodular BCC. Mohs wiht Dr. Corey.   Cancer of base of tongue (HCC) 2006   s/p chemoradiation   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, bilateral    Dyspnea    Dysrhythmia    Esophageal cancer (HCC) 2021   GERD (gastroesophageal reflux disease)    Hyperlipidemia    Hypertension    Numbness and tingling of both lower extremities    with positioning   Paroxysmal atrial fibrillation (HCC)    Port-A-Cath in place 2006   Prostate enlargement    Squamous cell carcinoma of skin 12/14/2019   L pretibial - ED&C    T2DM (type 2 diabetes mellitus) (HCC)     Past Surgical History:  Procedure Laterality Date   ABDOMINAL SURGERY     CAROTID PTA/STENT INTERVENTION Right 07/04/2021   Procedure: CAROTID PTA/STENT INTERVENTION;  Surgeon: Marea Selinda RAMAN, MD;  Location: [facility];  Service: Cardiovascular;  Laterality: Right;   COLONOSCOPY  12/08/2003   COLONOSCOPY WITH PROPOFOL  N/A 03/21/2019   Procedure: COLONOSCOPY WITH PROPOFOL ;  Surgeon: Toledo, Ladell POUR, MD;  Location: [facility];  Service: Gastroenterology;  Laterality: N/A;   ESOPHAGOGASTRODUODENOSCOPY (EGD) WITH PROPOFOL  N/A 03/21/2019   Procedure: ESOPHAGOGASTRODUODENOSCOPY (EGD) WITH PROPOFOL ;  Surgeon: Toledo, Ladell POUR, MD;  Location: [facility];   Service: Gastroenterology;  Laterality: N/A;   esophogeal cancer     JOINT REPLACEMENT Right    total hip   LUMBAR LAMINECTOMY/DECOMPRESSION MICRODISCECTOMY N/A 03/03/2018   Procedure: LUMBAR LAMINECTOMY/DECOMPRESSION MICRODISCECTOMY 1 LEVEL-L3-4,L4-5;  Surgeon: Clois Fret, MD;  Location: [facility];  Service: Neurosurgery;  Laterality: N/A;   LUMBAR LAMINECTOMY/DECOMPRESSION MICRODISCECTOMY N/A 02/24/2020   Procedure: LEFT L3-4 MICRODISCECTOMY, L4-5 DECOMPRESSION;  Surgeon: Clois Fret, MD;  Location: [facility];  Service: Neurosurgery;  Laterality: N/A;   MAXIMUM ACCESS (MAS) TRANSFORAMINAL LUMBAR INTERBODY FUSION (TLIF) 1 LEVEL N/A 07/13/2020   Procedure: OPEN L3-4 TRANSFORAMINAL LUMBAR INTERBODY FUSION (TLIF);  Surgeon: Clois Fret, MD;  Location: [facility];  Service: Neurosurgery;  Laterality: N/A;   PARTIAL HIP ARTHROPLASTY Right 2014   PORT-A-CATH REMOVAL     PORTACATH PLACEMENT Left 01/12/2020   Procedure: INSERTION PORT-A-CATH, with ultrasound fluoroscopy;  Surgeon: Volney Lye, MD;  Location: [facility];  Service: General;  Laterality: Left;   TONGUE BIOPSY  2006   TONSILLECTOMY     TRIGGER FINGER RELEASE Right 2013   UPPER GI ENDOSCOPY  01/25/14   multiple gastric polyps     Social History   Tobacco Use   Smoking status: Former    Current packs/day: 0.00    Average packs/day: 1 pack/day for 30.0 years (30.0 ttl pk-yrs)    Types: Cigarettes    Start date: 02/19/1964    Quit date: 02/18/1994    Years since quitting: 30.0   Smokeless tobacco: Never  Vaping Use   Vaping status: Never Used  Substance Use Topics   Alcohol use: Not Currently   Drug use: No      Family History  Problem Relation Age of Onset   Diabetes Mother    Congestive Heart Failure Mother    Breast cancer Mother    Lung cancer Father      Allergies  Allergen Reactions   Sulfa Antibiotics Rash  REVIEW OF SYSTEMS (Negative unless checked)   Constitutional: [] Weight  loss  [] Fever  [] Chills Cardiac: [] Chest pain   [] Chest pressure   [x] Palpitations   [] Shortness of breath when laying flat   [] Shortness of breath at rest   [] Shortness of breath with exertion. Vascular:  [] Pain in legs with walking   [] Pain in legs at rest   [] Pain in legs when laying flat   [] Claudication   [] Pain in feet when walking  [] Pain in feet at rest  [] Pain in feet when laying flat   [] History of DVT   [] Phlebitis   [] Swelling in legs   [] Varicose veins   [] Non-healing ulcers Pulmonary:   [] Uses home oxygen   [] Productive cough   [] Hemoptysis   [] Wheeze  [] COPD   [] Asthma Neurologic:  [] Dizziness  [x] Blackouts   [] Seizures   [x] History of stroke   [] History of TIA  [] Aphasia   [] Temporary blindness   [] Dysphagia   [] Weakness or numbness in arms   [] Weakness or numbness in legs Musculoskeletal:  [x] Arthritis   [] Joint swelling   [] Joint pain   [x] Low back pain Hematologic:  [] Easy bruising  [] Easy bleeding   [] Hypercoagulable state   [] Anemic  [] Hepatitis Gastrointestinal:  [] Blood in stool   [] Vomiting blood  [x] Gastroesophageal reflux/heartburn   [] Difficulty swallowing. Genitourinary:  [] Chronic kidney disease   [] Difficult urination  [x] Frequent urination  [] Burning with urination   [] Blood in urine Skin:  [] Rashes   [] Ulcers   [] Wounds Psychological:  [] History of anxiety   []  History of major depression.  Physical Examination  BP (!) 152/75   Pulse 80   Resp 17   Ht 5' 5 (1.651 m)   Wt 140 lb 3.2 oz (63.6 kg)   BMI 23.33 kg/m  Gen:  WD/WN, NAD Head: Xenia/AT, No temporalis wasting. Ear/Nose/Throat: Hearing grossly intact, nares w/o erythema or drainage Eyes: Conjunctiva clear. Sclera non-icteric Neck: Supple.  Trachea midline Pulmonary:  Good air movement, no use of accessory muscles.  Cardiac: irregular Vascular:  Vessel Right Left  Radial Palpable Palpable       Musculoskeletal: M/S 5/5 throughout.  No deformity or atrophy.  Neurologic: Sensation grossly intact in  extremities.  Symmetrical.  Speech is fluent.  Psychiatric: Judgment intact, Mood & affect appropriate for pt's clinical situation. Dermatologic: No rashes or ulcers noted.  No cellulitis or open wounds.  Physical Exam     Labs Recent Results (from the past 2160 hours)  Comprehensive metabolic panel     Status: Abnormal   Collection Time: 12/28/23 12:27 PM  Result Value Ref Range   Sodium 140 135 - 145 mmol/L   Potassium 4.2 3.5 - 5.1 mmol/L   Chloride 105 98 - 111 mmol/L   CO2 26 22 - 32 mmol/L   Glucose, Bld 162 (H) 70 - 99 mg/dL    Comment: Glucose reference range applies only to samples taken after fasting for at least 8 hours.   BUN 16 8 - 23 mg/dL   Creatinine, Ser 8.95 0.61 - 1.24 mg/dL   Calcium  8.6 (L) 8.9 - 10.3 mg/dL   Total Protein 6.4 (L) 6.5 - 8.1 g/dL   Albumin 3.5 3.5 - 5.0 g/dL   AST 25 15 - 41 U/L   ALT 34 0 - 44 U/L   Alkaline Phosphatase 54 38 - 126 U/L   Total Bilirubin 0.8 0.0 - 1.2 mg/dL   GFR, Estimated >39 >39 mL/min    Comment: (NOTE) Calculated using the CKD-EPI Creatinine Equation (  2021)    Anion gap 9 5 - 15    Comment: Performed at [facility], [address] [place], [place] [address]  CBC     Status: None   Collection Time: 12/28/23 12:27 PM  Result Value Ref Range   WBC 8.9 4.0 - 10.5 K/uL   RBC 4.94 4.22 - 5.81 MIL/uL   Hemoglobin 14.4 13.0 - 17.0 g/dL   HCT 53.7 60.9 - 47.9 %   MCV 93.5 80.0 - 100.0 fL   MCH 29.1 26.0 - 34.0 pg   MCHC 31.2 30.0 - 36.0 g/dL   RDW 86.7 88.4 - 84.4 %   Platelets 226 150 - 400 K/uL   nRBC 0.0 0.0 - 0.2 %    Comment: Performed at [facility], [address], [place], [place] [address]  Protime-INR - (order if patient is taking Coumadin / Warfarin)     Status: None   Collection Time: 12/28/23 12:27 PM  Result Value Ref Range   Prothrombin Time 13.5 11.4 - 15.2 seconds   INR 1.0 0.8 - 1.2    Comment: (NOTE) INR goal varies based on device and disease states. Performed at  [facility], [address] [place], [place] [address]   Troponin I (High Sensitivity)     Status: None   Collection Time: 12/28/23 12:27 PM  Result Value Ref Range   Troponin I (High Sensitivity) 7 <18 ng/L    Comment: (NOTE) Elevated high sensitivity troponin I (hsTnI) values and significant  changes across serial measurements may suggest ACS but many other  chronic and acute conditions are known to elevate hsTnI results.  Refer to the Links section for chest pain algorithms and additional  guidance. Performed at [facility], [address] [place], [place] [address]   Troponin I (High Sensitivity)     Status: None   Collection Time: 12/28/23  5:28 PM  Result Value Ref Range   Troponin I (High Sensitivity) 10 <18 ng/L    Comment: (NOTE) Elevated high sensitivity troponin I (hsTnI) values and significant  changes across serial measurements may suggest ACS but many other  chronic and acute conditions are known to elevate hsTnI results.  Refer to the Links section for chest pain algorithms and additional  guidance. Performed at [facility], [address], [place], [place] [address]     Radiology No results found.  Assessment/Plan Assessment & Plan Bilateral carotid artery stenosis with recurrent right sided high grade restenosis Worsening stenosis with high-grade velocity changes above the stent and moderate changes on the left. Increased recurrence risk due to prior neck irradiation. Current complication risk <1% over 3-4 weeks, but several percent annually and now warrants intervention.  With recurrence and previous history of neck radiation, endovascular intervention would be preferred over open surgical therapy. - Scheduled intervention after January 1st. - Initiate Plavix  5 days prior to procedure. - Continue Eliquis . - Monitor for symptoms such as arm or leg weakness, numbness, face droop, or speech difficulties and seek emergency  care if he occurs.  Essential (primary) hypertension blood pressure control important in reducing the progression of atherosclerotic disease. On appropriate oral medications.     Diabetes mellitus (HCC) blood glucose control important in reducing the progression of atherosclerotic disease. Also, involved in wound healing. On appropriate medications.     HLD (hyperlipidemia) lipid control important in reducing the progression of atherosclerotic disease. Continue statin therapy   Tongue cancer (HCC) History of radiation making him  more likely to have recurrent carotid stenosis   Selinda Gu, MD  03/01/2024 2:45 PM    This note was created with Dragon medical transcription system.  Any errors from dictation are purely unintentional

## 2024-03-03 ENCOUNTER — Other Ambulatory Visit: Payer: Self-pay

## 2024-03-03 DIAGNOSIS — I160 Hypertensive urgency: Secondary | ICD-10-CM | POA: Diagnosis not present

## 2024-03-03 DIAGNOSIS — R55 Syncope and collapse: Secondary | ICD-10-CM | POA: Diagnosis not present

## 2024-03-03 DIAGNOSIS — I480 Paroxysmal atrial fibrillation: Secondary | ICD-10-CM | POA: Diagnosis not present

## 2024-03-03 DIAGNOSIS — E782 Mixed hyperlipidemia: Secondary | ICD-10-CM | POA: Diagnosis not present

## 2024-03-03 DIAGNOSIS — R0602 Shortness of breath: Secondary | ICD-10-CM | POA: Diagnosis not present

## 2024-03-03 DIAGNOSIS — R001 Bradycardia, unspecified: Secondary | ICD-10-CM | POA: Diagnosis not present

## 2024-03-03 DIAGNOSIS — E118 Type 2 diabetes mellitus with unspecified complications: Secondary | ICD-10-CM | POA: Diagnosis not present

## 2024-03-03 DIAGNOSIS — I10 Essential (primary) hypertension: Secondary | ICD-10-CM | POA: Diagnosis not present

## 2024-03-03 DIAGNOSIS — I2510 Atherosclerotic heart disease of native coronary artery without angina pectoris: Secondary | ICD-10-CM | POA: Diagnosis not present

## 2024-03-03 MED ORDER — FLUZONE HIGH-DOSE 0.5 ML IM SUSY
0.5000 mL | PREFILLED_SYRINGE | Freq: Once | INTRAMUSCULAR | 0 refills | Status: AC
  Filled 2024-03-03: qty 0.5, 1d supply, fill #0

## 2024-03-15 ENCOUNTER — Other Ambulatory Visit: Payer: Self-pay

## 2024-03-15 MED ORDER — LISINOPRIL 10 MG PO TABS
10.0000 mg | ORAL_TABLET | Freq: Every day | ORAL | 1 refills | Status: AC
  Filled 2024-03-15: qty 90, 90d supply, fill #0

## 2024-03-17 NOTE — Progress Notes (Signed)
DUPLICATE NOTE

## 2024-03-22 ENCOUNTER — Telehealth (INDEPENDENT_AMBULATORY_CARE_PROVIDER_SITE_OTHER): Payer: Self-pay

## 2024-03-22 NOTE — Telephone Encounter (Signed)
 I attempted to contact the patient to schedule a right carotid stent placement with Dr. Marea. A message was left for a return call.

## 2024-03-28 NOTE — Telephone Encounter (Signed)
 Patient returned my call and is now scheduled for a right carotid stent placement on 04/04/24 with a 9:30 am arrival time to the [facility]. Pre-procedure instructions were discussed and will be sent to Mychart and mailed.

## 2024-03-30 NOTE — Progress Notes (Signed)
 Blake [name] is a  78 y.o. male who presents for  CHIEF COMPLAINT Chief Complaint  Patient presents with   Follow-up   Hypertension   Hyperlipidemia   Diabetes   Chronic Pain    Subjective: History of Present Illness  Pt in NAD. HTN stable on meds. Has HLD on statin, DM not on insulin  and chronic pain on Tramadol . Also with PAF on Eliquis . Weight stable. Having carotid surgery next week. No fever. Sleeping OK. No HA's. Denies CP, some SOB. No palpitations. No change in bowels or bladder.    Past Medical History:  Diagnosis Date   Acute postoperative pain 09/13/2019   Allergic rhinitis 08/24/2015   Anemia 04/25/2022   Benign essential hypertension 07/21/2014   Bilateral carotid artery stenosis 06/05/2016   50-69% bilateral 2018   Bilateral hand numbness 12/16/2018   Bilateral hand pain 12/16/2018   Carotid stenosis 04/22/2016   Last Assessment & Plan:  The patient's carotid duplex today demonstrates stable 1 to 39% right ICA stenosis but progression of his disease on the left now into the 60 to 79% range.  He remains asymptomatic.  We discussed full-strength aspirin  versus the addition of Plavix  and for now we will continue on the aspirin .  He will continue his high-dose statin therapy.  We will shorten his follow-up to    Chronic pain syndrome 12/08/2019   Coronary artery disease 06/26/2014   Drug eluding stent  100% lesion in the mid LAD. Occluded with poor collaterals. 1st diagonal 25% stenosis. 06/14/14    Diabetes mellitus without complication (CMS/HHS-HCC)    Esophageal cancer (CMS/HHS-HCC) 04/2019   GERD (gastroesophageal reflux disease)    Goals of care, counseling/discussion 05/04/2019   History of head and neck cancer 12/09/2019   History of lumbar laminectomy 12/08/2019   Formatting of this note might be different from the original. L3-L4 and L4-L5 microdiscectomy and laminectomy with Dr. 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   Type II diabetes mellitus with manifestations (CMS/HHS-HCC) 04/27/2018   Patient Active Problem List  Diagnosis   Mixed hyperlipidemia   GERD (gastroesophageal reflux disease)   Osteoarthritis   Coronary artery disease   Stenosing tenosynovitis of finger   Benign essential hypertension   Bilateral carotid artery stenosis   Primary osteoarthritis of right shoulder   Type II diabetes mellitus with manifestations (CMS/HHS-HCC)   Bilateral hand numbness   Bilateral hand pain   Carotid stenosis   Lumbar degenerative disc disease   Lumbar radiculopathy   Spinal stenosis, lumbar region, with neurogenic claudication   Allergic rhinitis   Acute postoperative pain   Chronic pain syndrome   Goals of care, counseling/discussion   History of head and neck cancer   History of lumbar laminectomy   Non-intractable vomiting   Pneumonia   Paroxysmal A-fib (CMS/HHS-HCC)   Cerebrovascular accident (CVA) due to embolism of cerebral artery (CMS/HHS-HCC)   Acidosis   Anemia   Encephalomalacia on  imaging study   Hypertensive urgency   Hypocalcemia   IDA (iron  deficiency anemia)   Loss of consciousness (CMS/HHS-HCC)   Lung nodules   Paraseptal emphysema (CMS/HHS-HCC)   Paresthesia   Positive D dimer   Shortness of breath   Sinus bradycardia   Spondylolisthesis   Vasovagal syncope    Past Surgical  History:  Procedure Laterality Date   Trigger finger release Right 2013   JOINT REPLACEMENT Right 01/24/2013   RIGHT TOTAL HIP ARTHROPLASTY   L3-4 L4-5 lumbar decompression  03/03/2018   Dr Reeves Daisy at [facility]   right hand surgery Right 01/31/2019   COLONOSCOPY  03/21/2019   Tubular adenoma of the colon. 5 year repeat.    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 Repeat per oncology Per [facility].   ESOPHAGOGASTRODOUDENOSCOPY W/BIOPSY N/A 06/09/2019   Procedure: THERAPEUTIC EGD;  Surgeon: Ramona Sammi Haws, MD;  Location: [facility];  Service: Gastroenterology;  Laterality: N/A;   ESOPHAGOGASTRODOUDENOSCOPY N/A 06/09/2019   Procedure: ENDOSCOPIC MUCOSAL RESECTION;  Surgeon: Ramona Sammi Haws, MD;  Location: [facility];  Service: Gastroenterology;  Laterality: N/A;   ESOPHAGOGASTRODOUDENOSCOPY W/BIOPSY N/A 08/08/2019   Procedure: ESOPHAGOGASTRODUODENOSCOPY, FLEXIBLE, TRANSORAL; WITH BIOPSY, SINGLE OR MULTIPLE;  Surgeon: Medora Debby LABOR, MD;  Location: [facility];  Service: Cardiothoracic;  Laterality: N/A;   ESOPHAGOGASTRODOUDENOSCOPY N/A 08/08/2019   Procedure: ESOPHAGOGASTRODUODENOSCOPY, FLEXIBLE, TRANSORAL; WITH ENDOSCOPIC MUCOSAL RESECTION, possible;  Surgeon: Medora Debby LABOR, MD;  Location: [facility];  Service: Cardiothoracic;  Laterality: N/A;   JEJUNOSTOMY N/A 09/12/2019   Procedure: TUBE OR NEEDLE CATHETER JEJUNOSTOMY FOR ENTERAL ALIMENTATION,INTRAOPERATIVE, ANY METHOD (LIST IN ADDITION TO PRIMARY PROCEDURE);  Surgeon: Medora Debby LABOR, MD;  Location: [facility];  Service: Cardiothoracic;  Laterality: N/A;   ESOPHAGOGASTRODOUDENOSCOPY W/TRANSENDOSCOPIC STENT PLACEMENT N/A 09/23/2019   Procedure: ESOPHAGOGASTRODUODENOSCOPY, FLEXIBLE, TRANSORAL; WITH PLACEMENT OF ENDOSCOPIC STENT (INCLUDES PRE- AND POST-DILATION AND GUIDE WIREPASSAGE, WHEN PERFORMED);  Surgeon: Medora Debby LABOR, MD;  Location: [facility];  Service: Cardiothoracic;  Laterality: N/A;   ESOPHAGOGASTRODOUDENOSCOPY W/TRANSENDOSCOPIC STENT PLACEMENT N/A 11/11/2019   Procedure: ESOPHAGOGASTRODUODENOSCOPY, FLEXIBLE, TRANSORAL; WITH removal OF ENDOSCOPIC STENT (INCLUDES PRE- AND POST-DILATION AND GUIDE WIREPASSAGE, WHEN PERFORMED), posible stent revision;  Surgeon: Medora Debby LABOR, MD;  Location: [facility];  Service: Cardiothoracic;  Laterality: N/A;   LEFT L3-4 MICRODISCECTOMY AND L4-5 DECOMPRESSION  02/24/2020   Dr. Reeves Daisy @[facility]    OPEN L3-4 TRANSFORAMINAL LUMBAR INTERBODY FUSION   07/13/2020   Dr. Reeves Daisy at [facility], [place]   CORONARY ANGIOPLASTY WITH STENT PLACEMENT     SPINE SURGERY     Tongue biopsy surgery       Current Outpatient Medications:    apixaban  (ELIQUIS ) 5 mg tablet, Take 1 tablet (5 mg total) by mouth every 12 (twelve) hours, Disp: 60 tablet, Rfl: 5   cyanocobalamin  (VITAMIN B12) 1000 MCG tablet, Take 1,000 mcg by mouth once daily, Disp: , Rfl:    desvenlafaxine  succinate (PRISTIQ ) 50 MG ER tablet, Take 1 tablet (50 mg total) by mouth once daily, Disp: 30 tablet, Rfl: 11   dilTIAZem  (CARDIZEM  CD) 180 MG CD capsule, Take 1 capsule (180 mg total) by mouth daily. Please hold until you see your cardiologist, Disp: 90 capsule, Rfl: 1   ibuprofen  (MOTRIN ) 800 MG tablet, Take 1 tablet (800 mg total) by mouth every 8 (eight) hours as needed for pain., Disp: 60 tablet, Rfl: 1   lisinopriL  (ZESTRIL ) 10 MG tablet, Take 1  tablet (10 mg total) by mouth daily., Disp: 90 tablet, Rfl: 1   methocarbamoL  (ROBAXIN ) 500 MG tablet, Take 1 tablet (500 mg total) by mouth every 6 (six) hours as needed, Disp: 90 tablet, Rfl: 1   metoprolol  SUCCinate (TOPROL -XL) 100 MG XL tablet, Take 2 tablets (200 mg total) by mouth daily., Disp: 180 tablet, Rfl: 1   mirtazapine  (REMERON ) 15 MG tablet, Take 1 tablet (15 mg total) by mouth Nightly., Disp: 30 tablet, Rfl:  11   omeprazole  (PRILOSEC) 40 MG DR capsule, Take 1 capsule (40 mg total) by mouth daily., Disp: 90 capsule, Rfl: 1   promethazine  (PHENERGAN ) 25 MG tablet, Take 1 tablet (25 mg total) by mouth every 6 (six) hours as needed, Disp: 60 tablet, Rfl: 0   rosuvastatin  (CRESTOR ) 10 MG tablet, Take 1 tablet (10 mg total) by mouth once daily, Disp: 90 tablet, Rfl: 3   tamsulosin  (FLOMAX ) 0.4 mg capsule, Take 1 capsule (0.4 mg total) by mouth once daily TAKE 30 MINUTES AFTER SAME MEAL EACH DAY, Disp: 90 capsule, Rfl: 3   traMADoL  (ULTRAM ) 50 mg tablet, Take 50 mg by mouth every 6 (six) hours as needed, Disp: , Rfl:   Sulfa (sulfonamide antibiotics)  Social History   Socioeconomic History   Marital status: Widowed  Tobacco Use   Smoking status: Former    Current packs/day: 0.00    Average packs/day: 1.5 packs/day for 30.0 years (45.0 ttl pk-yrs)    Types: Cigarettes    Start date: 1963    Quit date: 1993    Years since quitting: 33.0   Smokeless tobacco: Never  Vaping Use   Vaping status: Never Used  Substance and Sexual Activity   Alcohol use: Yes    Alcohol/week: 0.0 standard drinks of alcohol    Comment: 2 wine a day   Drug use: No   Sexual activity: Yes    Partners: Female   Social Drivers of Corporate Investment Banker Strain: Low Risk  (03/02/2023)   Overall Financial Resource Strain (CARDIA)    Difficulty of Paying Living Expenses: Not hard at all  Food Insecurity: No Food Insecurity (03/02/2023)   Hunger Vital Sign    Worried About Running Out of Food in the Last Year: Never true    Ran Out of Food in the Last Year: Never true  Transportation Needs: No Transportation Needs (03/02/2023)   PRAPARE - Administrator, Civil Service (Medical): No    Lack of Transportation (Non-Medical): No  Housing Stability: Unknown (09/24/2023)   Housing Stability Vital Sign    Unable to Pay for Housing in the Last Year: No    Homeless in the Last Year: No     Family History  Problem Relation Name Age of Onset   Heart failure Mother     Lung cancer Father     Anesthesia problems Neg Hx     Malignant hyperthermia Neg Hx      A comprehensive ROS was negative except for HPI  PE: BP 116/74   Pulse 68   Ht 165.1 cm (5' 5)   Wt 62.6 kg (138 lb)   SpO2 94%   BMI 22.96 kg/m  The patient looks well today. He is in no distress. Neck is supple without adenopathy.  Carotids are 2+ bilaterally without bruits. Lungs are clear. Heart is regular rate and rhythm without murmurs, rubs, or gallops. Abdomen is soft, non tender, no masses or organomegaly. Lower extremities without obvious edema.  Office Visit on 02/10/2024  Component Date Value Ref Range Status   Cholesterol, Total 02/10/2024 121  100 - 200 mg/dL Final   Triglyceride 88/87/7974 102  35 - 199 mg/dL Final   HDL (High Density Lipoprotein) Cho* 02/10/2024 44.5  29.0 - 71.0 mg/dL Final   LDL Calculated 02/10/2024 56  0 - 130 mg/dL Final   VLDL Cholesterol 02/10/2024 20  mg/dL Final   Cholesterol/HDL Ratio 02/10/2024 2.7   Final   WBC (White Blood Cell Count) 02/10/2024 8.2  4.1 - 10.2 103/uL Final   RBC (Red Blood Cell Count) 02/10/2024 4.56 (L)  4.69 - 6.13 106/uL Final   Hemoglobin 02/10/2024 13.5 (L)  14.1 - 18.1 gm/dL Final   Hematocrit 88/87/7974 42.4  40.0 - 52.0 % Final   MCV (Mean Corpuscular Volume) 02/10/2024 93.0  80.0 - 100.0 fl Final   MCH (Mean Corpuscular Hemoglobin) 02/10/2024 29.6  27.0 - 31.2 pg Final   MCHC (Mean Corpuscular Hemoglobin * 02/10/2024 31.8 (L)  32.0 - 36.0 gm/dL Final   Platelet Count 02/10/2024 218  150 - 450 103/uL Final   RDW-CV (Red Cell Distribution Widt* 02/10/2024 13.1  11.6 - 14.8 % Final   MPV (Mean Platelet Volume) 02/10/2024 9.8  9.4 - 12.4 fl Final   Neutrophils 02/10/2024 6.48  1.50 - 7.80 103/uL Final   Lymphocytes 02/10/2024 1.02  1.00 - 3.60 103/uL Final   Monocytes 02/10/2024 0.54  0.00 - 1.50 103/uL Final    Eosinophils 02/10/2024 0.10  0.00 - 0.55 103/uL Final   Basophils 02/10/2024 0.06  0.00 - 0.09 103/uL Final   Neutrophil % 02/10/2024 78.9 (H)  32.0 - 70.0 % Final   Lymphocyte % 02/10/2024 12.4  10.0 - 50.0 % Final   Monocyte % 02/10/2024 6.6  4.0 - 13.0 % Final   Eosinophil % 02/10/2024 1.2  1.0 - 5.0 % Final   Basophil% 02/10/2024 0.7  0.0 - 2.0 % Final   Immature Granulocyte % 02/10/2024 0.2  <=0.7 % Final   Immature Granulocyte Count 02/10/2024 0.02  <=0.06 10^3/L Final   Glucose 02/10/2024 129 (H)  70 - 110 mg/dL Final   Sodium 88/87/7974 141  136 - 145 mmol/L Final   Potassium 02/10/2024 4.8  3.6 - 5.1 mmol/L Final   Chloride 02/10/2024 107  97 - 109 mmol/L Final   Carbon Dioxide (CO2) 02/10/2024 30.2  22.0 - 32.0 mmol/L Final   Urea Nitrogen (BUN) 02/10/2024 16  7 - 25 mg/dL Final   Creatinine 88/87/7974 1.1  0.7 - 1.3 mg/dL Final   Glomerular Filtration Rate (eGFR) 02/10/2024 69  >60 mL/min/1.73sq m Final   Calcium  02/10/2024 8.8  8.7 - 10.3 mg/dL Final   AST  88/87/7974 30  8 - 39 U/L Final   ALT  02/10/2024 41  6 - 57 U/L Final   Alk Phos (alkaline Phosphatase) 02/10/2024 70  34 - 104 U/L Final   Albumin 02/10/2024 3.9  3.5 - 4.8 g/dL Final   Bilirubin, Total 02/10/2024 0.6  0.3 - 1.2 mg/dL Final   Protein, Total 02/10/2024 6.1  6.1 - 7.9 g/dL Final   A/G Ratio 88/87/7974 1.8  1.0 - 5.0 gm/dL Final   Thyroid  Stimulating Hormone (TSH) 02/10/2024 2.662  0.450-5.330 uIU/ml uIU/mL Final   Color 02/10/2024 Yellow  Colorless, Straw, Light Yellow, Yellow, Dark Yellow Final   Clarity 02/10/2024 Clear  Clear Final   Specific Gravity 02/10/2024 1.023  1.005 - 1.030 Final   pH, Urine 02/10/2024 6.0  5.0 - 8.0 Final   Protein, Urinalysis 02/10/2024 Negative  Negative mg/dL Final   Glucose, Urinalysis 02/10/2024 Negative  Negative mg/dL Final   Ketones, Urinalysis 02/10/2024 Negative  Negative mg/dL Final   Blood, Urinalysis 02/10/2024 Negative   Negative Final   Nitrite, Urinalysis 02/10/2024 Negative  Negative Final   Leukocyte Esterase, Urinalysis 02/10/2024 Negative  Negative Final   Bilirubin, Urinalysis 02/10/2024 Negative  Negative Final   Urobilinogen, Urinalysis 02/10/2024 3.0 (H)  0.2 - 1.0 mg/dL Final   WBC, UA 88/87/7974 1  <=5 /hpf Final   Red Blood Cells, Urinalysis 02/10/2024 1  <=3 /hpf Final   Bacteria, Urinalysis 02/10/2024 0-5  0 - 5 /hpf Final   Squamous Epithelial Cells, Urinaly* 02/10/2024 0  /hpf Final   Hemoglobin A1C 02/10/2024 6.6 (H)  4.2 - 5.6 % Final   Average Blood Glucose (Calc) 02/10/2024 143  mg/dL Final  Office Visit on 11/12/2023  Component Date Value Ref Range Status   Hemoccult ICT 11/12/2023 Negative  Negative Final   Hemoccult ICT 11/12/2023 Negative  Negative Final  Office Visit on 11/05/2023  Component Date Value Ref Range Status   WBC (White Blood Cell Count) 11/05/2023 7.9  4.1 - 10.2 103/uL Final   RBC (Red Blood Cell Count) 11/05/2023 4.63 (L)  4.69 - 6.13 106/uL Final   Hemoglobin 11/05/2023 13.9 (L)  14.1 - 18.1 gm/dL Final   Hematocrit 91/92/7974 43.0  40.0 - 52.0 % Final   MCV (Mean Corpuscular Volume) 11/05/2023 92.9  80.0 - 100.0 fl Final   MCH (Mean Corpuscular Hemoglobin) 11/05/2023 30.0  27.0 - 31.2 pg Final   MCHC (Mean Corpuscular Hemoglobin * 11/05/2023 32.3  32.0 - 36.0 gm/dL Final   Platelet Count 11/05/2023 242  150 - 450 103/uL Final   RDW-CV (Red Cell Distribution Widt* 11/05/2023 13.5  11.6 - 14.8 % Final   MPV (Mean Platelet Volume) 11/05/2023 10.2  9.4 - 12.4 fl Final   Neutrophils 11/05/2023 6.38  1.50 - 7.80 103/uL Final   Lymphocytes 11/05/2023 0.89 (L)  1.00 - 3.60 103/uL Final   Monocytes 11/05/2023 0.48  0.00 - 1.50 103/uL Final   Eosinophils 11/05/2023 0.11  0.00 - 0.55 103/uL Final   Basophils 11/05/2023 0.05  0.00 - 0.09 103/uL Final   Neutrophil % 11/05/2023 80.4 (H)  32.0 - 70.0 % Final   Lymphocyte % 11/05/2023  11.2  10.0 - 50.0 % Final   Monocyte % 11/05/2023 6.1  4.0 - 13.0 % Final   Eosinophil % 11/05/2023 1.4  1.0 - 5.0 % Final   Basophil% 11/05/2023 0.6  0.0 - 2.0 % Final   Immature Granulocyte % 11/05/2023 0.3  <=0.7 % Final   Immature Granulocyte Count 11/05/2023 0.02  <=0.06 10^3/L Final   Vitamin B12 11/05/2023 1,317  >300 pg/mL Final   Hemoglobin A1C 11/05/2023 6.6 (H)  4.2 - 5.6 % Final   Average Blood Glucose (Calc) 11/05/2023 143  mg/dL Final  Results Follow-Up on 09/16/2023  Component Date Value Ref Range Status   Glucose 09/24/2023 187 (H)  70 - 110 mg/dL Final   Sodium 93/73/7974 137  136 - 145 mmol/L Final   Potassium 09/24/2023 5.2 (H)  3.6 - 5.1 mmol/L Final   Chloride 09/24/2023 104  97 - 109 mmol/L Final   Carbon Dioxide (CO2) 09/24/2023 29.3  22.0 - 32.0 mmol/L Final   Urea Nitrogen (BUN) 09/24/2023 21  7 - 25 mg/dL Final   Creatinine 93/73/7974 1.3  0.7 - 1.3  mg/dL Final   Glomerular Filtration Rate (eGFR) 09/24/2023 57 (L)  >60 mL/min/1.73sq m Final   Calcium  09/24/2023 8.8  8.7 - 10.3 mg/dL Final   Albumin 93/73/7974 4.0  3.5 - 4.8 g/dL Final   Phosphorus 93/73/7974 3.1  2.5 - 5.0 mg/dL Final  Office Visit on 09/16/2023  Component Date Value Ref Range Status   WBC (White Blood Cell Count) 09/16/2023 8.0  4.1 - 10.2 103/uL Final   RBC (Red Blood Cell Count) 09/16/2023 4.91  4.69 - 6.13 106/uL Final   Hemoglobin 09/16/2023 14.5  14.1 - 18.1 gm/dL Final   Hematocrit 93/81/7974 46.6  40.0 - 52.0 % Final   MCV (Mean Corpuscular Volume) 09/16/2023 94.9  80.0 - 100.0 fl Final   MCH (Mean Corpuscular Hemoglobin) 09/16/2023 29.5  27.0 - 31.2 pg Final   MCHC (Mean Corpuscular Hemoglobin * 09/16/2023 31.1 (L)  32.0 - 36.0 gm/dL Final   Platelet Count 09/16/2023 248  150 - 450 103/uL Final   RDW-CV (Red Cell Distribution Widt* 09/16/2023 13.2  11.6 - 14.8 % Final   MPV (Mean Platelet Volume) 09/16/2023 10.1  9.4 - 12.4 fl Final   Neutrophils  09/16/2023 6.46  1.50 - 7.80 103/uL Final   Lymphocytes 09/16/2023 0.87 (L)  1.00 - 3.60 103/uL Final   Monocytes 09/16/2023 0.51  0.00 - 1.50 103/uL Final   Eosinophils 09/16/2023 0.08  0.00 - 0.55 103/uL Final   Basophils 09/16/2023 0.06  0.00 - 0.09 103/uL Final   Neutrophil % 09/16/2023 80.6 (H)  32.0 - 70.0 % Final   Lymphocyte % 09/16/2023 10.9  10.0 - 50.0 % Final   Monocyte % 09/16/2023 6.4  4.0 - 13.0 % Final   Eosinophil % 09/16/2023 1.0  1.0 - 5.0 % Final   Basophil% 09/16/2023 0.8  0.0 - 2.0 % Final   Immature Granulocyte % 09/16/2023 0.3  <=0.7 % Final   Immature Granulocyte Count 09/16/2023 0.02  <=0.06 10^3/L Final   Glucose 09/16/2023 121 (H)  70 - 110 mg/dL Final   Sodium 93/81/7974 141  136 - 145 mmol/L Final   Potassium 09/16/2023 5.6 (H)  3.6 - 5.1 mmol/L Final   Chloride 09/16/2023 107  97 - 109 mmol/L Final   Carbon Dioxide (CO2) 09/16/2023 28.8  22.0 - 32.0 mmol/L Final   Urea Nitrogen (BUN) 09/16/2023 26 (H)  7 - 25 mg/dL Final   Creatinine 93/81/7974 1.3  0.7 - 1.3 mg/dL Final   Glomerular Filtration Rate (eGFR) 09/16/2023 57 (L)  >60 mL/min/1.73sq m Final   Calcium  09/16/2023 9.2  8.7 - 10.3 mg/dL Final   AST  93/81/7974 31  8 - 39 U/L Final   ALT  09/16/2023 42  6 - 57 U/L Final   Alk Phos (alkaline Phosphatase) 09/16/2023 66  34 - 104 U/L Final   Albumin 09/16/2023 4.3  3.5 - 4.8 g/dL Final   Bilirubin, Total 09/16/2023 0.5  0.3 - 1.2 mg/dL Final   Protein, Total 09/16/2023 6.3  6.1 - 7.9 g/dL Final   A/G Ratio 93/81/7974 2.2  1.0 - 5.0 gm/dL Final   Thyroid  Stimulating Hormone (TSH) 09/16/2023 3.072  0.450-5.330 uIU/ml uIU/mL Final   Color 09/16/2023 Yellow  Colorless, Straw, Light Yellow, Yellow, Dark Yellow Final   Clarity 09/16/2023 Clear  Clear Final   Specific Gravity 09/16/2023 1.025  1.005 - 1.030 Final   pH, Urine 09/16/2023 5.0  5.0 - 8.0 Final   Protein, Urinalysis 09/16/2023 Negative  Negative mg/dL Final  Glucose, Urinalysis 09/16/2023 Negative  Negative mg/dL Final   Ketones, Urinalysis 09/16/2023 Negative  Negative mg/dL Final   Blood, Urinalysis 09/16/2023 Negative  Negative Final   Nitrite, Urinalysis 09/16/2023 Negative  Negative Final   Leukocyte Esterase, Urinalysis 09/16/2023 Negative  Negative Final   Bilirubin, Urinalysis 09/16/2023 Negative  Negative Final   Urobilinogen, Urinalysis 09/16/2023 0.2  0.2 - 1.0 mg/dL Final   WBC, UA 93/81/7974 3  <=5 /hpf Final   Red Blood Cells, Urinalysis 09/16/2023 1  <=3 /hpf Final   Bacteria, Urinalysis 09/16/2023 0-5  0 - 5 /hpf Final   Squamous Epithelial Cells, Urinaly* 09/16/2023 0  /hpf Final  Office Visit on 07/06/2023  Component Date Value Ref Range Status   WBC (White Blood Cell Count) 07/06/2023 10.5 (H)  4.1 - 10.2 103/uL Final   RBC (Red Blood Cell Count) 07/06/2023 4.87  4.69 - 6.13 106/uL Final   Hemoglobin 07/06/2023 14.6  14.1 - 18.1 gm/dL Final   Hematocrit 95/92/7974 46.2  40.0 - 52.0 % Final   MCV (Mean Corpuscular Volume) 07/06/2023 94.9  80.0 - 100.0 fl Final   MCH (Mean Corpuscular Hemoglobin) 07/06/2023 30.0  27.0 - 31.2 pg Final   MCHC (Mean Corpuscular Hemoglobin * 07/06/2023 31.6 (L)  32.0 - 36.0 gm/dL Final   Platelet Count 07/06/2023 268  150 - 450 103/uL Final   RDW-CV (Red Cell Distribution Widt* 07/06/2023 13.4  11.6 - 14.8 % Final   MPV (Mean Platelet Volume) 07/06/2023 10.1  9.4 - 12.4 fl Final   Neutrophils 07/06/2023 8.63 (H)  1.50 - 7.80 103/uL Final   Lymphocytes 07/06/2023 0.98 (L)  1.00 - 3.60 103/uL Final   Monocytes 07/06/2023 0.65  0.00 - 1.50 103/uL Final   Eosinophils 07/06/2023 0.09  0.00 - 0.55 103/uL Final   Basophils 07/06/2023 0.07  0.00 - 0.09 103/uL Final   Neutrophil % 07/06/2023 82.4 (H)  32.0 - 70.0 % Final   Lymphocyte % 07/06/2023 9.4 (L)  10.0 - 50.0 % Final   Monocyte % 07/06/2023 6.2  4.0 - 13.0 % Final   Eosinophil % 07/06/2023 0.9 (L)  1.0 -  5.0 % Final   Basophil% 07/06/2023 0.7  0.0 - 2.0 % Final   Immature Granulocyte % 07/06/2023 0.4  <=0.7 % Final   Immature Granulocyte Count 07/06/2023 0.04  <=0.06 10^3/L Final   Glucose 07/06/2023 183 (H)  70 - 110 mg/dL Final   Sodium 95/92/7974 141  136 - 145 mmol/L Final   Potassium 07/06/2023 5.3 (H)  3.6 - 5.1 mmol/L Final   Chloride 07/06/2023 106  97 - 109 mmol/L Final   Carbon Dioxide (CO2) 07/06/2023 31.5  22.0 - 32.0 mmol/L Final   Urea Nitrogen (BUN) 07/06/2023 21  7 - 25 mg/dL Final   Creatinine 95/92/7974 1.1  0.7 - 1.3 mg/dL Final   Glomerular Filtration Rate (eGFR) 07/06/2023 69  >60 mL/min/1.73sq m Final   Calcium  07/06/2023 9.2  8.7 - 10.3 mg/dL Final   AST  95/92/7974 32  8 - 39 U/L Final   ALT  07/06/2023 40  6 - 57 U/L Final   Alk Phos (alkaline Phosphatase) 07/06/2023 69  34 - 104 U/L Final   Albumin 07/06/2023 4.1  3.5 - 4.8 g/dL Final   Bilirubin, Total 07/06/2023 0.5  0.3 - 1.2 mg/dL Final   Protein, Total 07/06/2023 6.1  6.1 - 7.9 g/dL Final   A/G Ratio 95/92/7974 2.1  1.0 - 5.0 gm/dL Final   Color 95/92/7974  Light Yellow  Colorless, Straw, Light Yellow, Yellow, Dark Yellow Final   Clarity 07/06/2023 Clear  Clear Final   Specific Gravity 07/06/2023 1.017  1.005 - 1.030 Final   pH, Urine 07/06/2023 5.5  5.0 - 8.0 Final   Protein, Urinalysis 07/06/2023 Negative  Negative mg/dL Final   Glucose, Urinalysis 07/06/2023 3+ (!)  Negative mg/dL Final   Ketones, Urinalysis 07/06/2023 Negative  Negative mg/dL Final   Blood, Urinalysis 07/06/2023 Negative  Negative Final   Nitrite, Urinalysis 07/06/2023 Negative  Negative Final   Leukocyte Esterase, Urinalysis 07/06/2023 Negative  Negative Final   Bilirubin, Urinalysis 07/06/2023 Negative  Negative Final   Urobilinogen, Urinalysis 07/06/2023 0.2  0.2 - 1.0 mg/dL Final   WBC, UA 95/92/7974 1  <=5 /hpf Final   Red Blood Cells, Urinalysis 07/06/2023 1  <=3 /hpf Final   Bacteria,  Urinalysis 07/06/2023 0-5  0 - 5 /hpf Final   Squamous Epithelial Cells, Urinaly* 07/06/2023 0  /hpf Final   Hemoglobin A1C 07/06/2023 7.2 (H)  4.2 - 5.6 % Final   Average Blood Glucose (Calc) 07/06/2023 160  mg/dL Final  Appointment on 98/97/7974  Component Date Value Ref Range Status   Cholesterol, Total 04/02/2023 134  100 - 200 mg/dL Final   Triglyceride 98/97/7974 108  35 - 199 mg/dL Final   HDL (High Density Lipoprotein) Cho* 04/02/2023 40.5  29.0 - 71.0 mg/dL Final   LDL Calculated 04/02/2023 72  0 - 130 mg/dL Final   VLDL Cholesterol 04/02/2023 22  mg/dL Final   Cholesterol/HDL Ratio 04/02/2023 3.3   Final   WBC (White Blood Cell Count) 04/02/2023 7.7  4.1 - 10.2 103/uL Final   RBC (Red Blood Cell Count) 04/02/2023 4.86  4.69 - 6.13 106/uL Final   Hemoglobin 04/02/2023 14.4  14.1 - 18.1 gm/dL Final   Hematocrit 98/97/7974 45.6  40.0 - 52.0 % Final   MCV (Mean Corpuscular Volume) 04/02/2023 93.8  80.0 - 100.0 fl Final   MCH (Mean Corpuscular Hemoglobin) 04/02/2023 29.6  27.0 - 31.2 pg Final   MCHC (Mean Corpuscular Hemoglobin * 04/02/2023 31.6 (L)  32.0 - 36.0 gm/dL Final   Platelet Count 04/02/2023 249  150 - 450 103/uL Final   RDW-CV (Red Cell Distribution Widt* 04/02/2023 13.3  11.6 - 14.8 % Final   MPV (Mean Platelet Volume) 04/02/2023 10.1  9.4 - 12.4 fl Final   Neutrophils 04/02/2023 5.52  1.50 - 7.80 103/uL Final   Lymphocytes 04/02/2023 1.32  1.00 - 3.60 103/uL Final   Monocytes 04/02/2023 0.67  0.00 - 1.50 103/uL Final   Eosinophils 04/02/2023 0.15  0.00 - 0.55 103/uL Final   Basophils 04/02/2023 0.04  0.00 - 0.09 103/uL Final   Neutrophil % 04/02/2023 71.5 (H)  32.0 - 70.0 % Final   Lymphocyte % 04/02/2023 17.1  10.0 - 50.0 % Final   Monocyte % 04/02/2023 8.7  4.0 - 13.0 % Final   Eosinophil % 04/02/2023 1.9  1.0 - 5.0 % Final   Basophil% 04/02/2023 0.5  0.0 - 2.0 % Final   Immature Granulocyte % 04/02/2023 0.3  <=0.7 % Final    Immature Granulocyte Count 04/02/2023 0.02  <=0.06 10^3/L Final   Glucose 04/02/2023 145 (H)  70 - 110 mg/dL Final   Sodium 98/97/7974 145  136 - 145 mmol/L Final   Potassium 04/02/2023 5.1  3.6 - 5.1 mmol/L Final   Chloride 04/02/2023 108  97 - 109 mmol/L Final   Carbon Dioxide (CO2) 04/02/2023 29.6  22.0 -  32.0 mmol/L Final   Urea Nitrogen (BUN) 04/02/2023 17  7 - 25 mg/dL Final   Creatinine 98/97/7974 1.1  0.7 - 1.3 mg/dL Final   Glomerular Filtration Rate (eGFR) 04/02/2023 69  >60 mL/min/1.73sq m Final   Calcium  04/02/2023 9.0  8.7 - 10.3 mg/dL Final   AST  98/97/7974 17  8 - 39 U/L Final   ALT  04/02/2023 25  6 - 57 U/L Final   Alk Phos (alkaline Phosphatase) 04/02/2023 68  34 - 104 U/L Final   Albumin 04/02/2023 3.8  3.5 - 4.8 g/dL Final   Bilirubin, Total 04/02/2023 0.6  0.3 - 1.2 mg/dL Final   Protein, Total 04/02/2023 5.6 (L)  6.1 - 7.9 g/dL Final   A/G Ratio 98/97/7974 2.1  1.0 - 5.0 gm/dL Final   PSA (Prostate Specific Antigen), T* 04/02/2023 1.61  0.10 - 4.00 ng/mL Final   Thyroid  Stimulating Hormone (TSH) 04/02/2023 2.865  0.450-5.330 uIU/ml uIU/mL Final   Color 04/02/2023 Yellow  Colorless, Straw, Light Yellow, Yellow, Dark Yellow Final   Clarity 04/02/2023 Clear  Clear Final   Specific Gravity 04/02/2023 1.024  1.005 - 1.030 Final   pH, Urine 04/02/2023 6.0  5.0 - 8.0 Final   Protein, Urinalysis 04/02/2023 Negative  Negative mg/dL Final   Glucose, Urinalysis 04/02/2023 Negative  Negative mg/dL Final   Ketones, Urinalysis 04/02/2023 Negative  Negative mg/dL Final   Blood, Urinalysis 04/02/2023 Negative  Negative Final   Nitrite, Urinalysis 04/02/2023 Negative  Negative Final   Leukocyte Esterase, Urinalysis 04/02/2023 Negative  Negative Final   Bilirubin, Urinalysis 04/02/2023 Negative  Negative Final   Urobilinogen, Urinalysis 04/02/2023 0.2  0.2 - 1.0 mg/dL Final   WBC, UA 98/97/7974 1  <=5 /hpf Final   Red Blood Cells, Urinalysis  04/02/2023 1  <=3 /hpf Final   Bacteria, Urinalysis 04/02/2023 0-5  0 - 5 /hpf Final   Squamous Epithelial Cells, Urinaly* 04/02/2023 0  /hpf Final   Hemoglobin A1C 04/02/2023 6.8 (H)  4.2 - 5.6 % Final   Average Blood Glucose (Calc) 04/02/2023 148  mg/dL Final   Vitamin D , 25-Hydroxy - [facility] 04/02/2023 46.6  30.0 - 100.0 ng/mL Final   DIAGNOSIS: Benign essential hypertension  (primary encounter diagnosis)  Paroxysmal A-fib (CMS/HHS-HCC)  Type II diabetes mellitus with manifestations (CMS/HHS-HCC)  Mixed hyperlipidemia  Chronic pain syndrome   PLAN: No change in care. Proceed with surgery. RTC 6 weeks, sooner if needed     Attestation Statement:   I personally performed the service. (TP)  Reyes JONETTA Costa, MD, MD

## 2024-04-04 ENCOUNTER — Inpatient Hospital Stay
Admission: RE | Admit: 2024-04-04 | Discharge: 2024-04-06 | DRG: 036 | Disposition: A | Discharge status: Home / Self Care | Attending: Vascular Surgery | Admitting: Vascular Surgery

## 2024-04-04 ENCOUNTER — Other Ambulatory Visit: Payer: Self-pay

## 2024-04-04 ENCOUNTER — Encounter: Payer: Self-pay | Admitting: Vascular Surgery

## 2024-04-04 ENCOUNTER — Encounter: Admission: RE | Disposition: A | Payer: Self-pay | Source: Home / Self Care | Attending: Vascular Surgery

## 2024-04-04 DIAGNOSIS — I6523 Occlusion and stenosis of bilateral carotid arteries: Secondary | ICD-10-CM | POA: Diagnosis present

## 2024-04-04 DIAGNOSIS — I480 Paroxysmal atrial fibrillation: Secondary | ICD-10-CM | POA: Diagnosis present

## 2024-04-04 DIAGNOSIS — I959 Hypotension, unspecified: Secondary | ICD-10-CM | POA: Diagnosis not present

## 2024-04-04 DIAGNOSIS — Z833 Family history of diabetes mellitus: Secondary | ICD-10-CM

## 2024-04-04 DIAGNOSIS — Z85828 Personal history of other malignant neoplasm of skin: Secondary | ICD-10-CM

## 2024-04-04 DIAGNOSIS — N400 Enlarged prostate without lower urinary tract symptoms: Secondary | ICD-10-CM | POA: Diagnosis present

## 2024-04-04 DIAGNOSIS — E119 Type 2 diabetes mellitus without complications: Secondary | ICD-10-CM | POA: Diagnosis present

## 2024-04-04 DIAGNOSIS — I10 Essential (primary) hypertension: Secondary | ICD-10-CM | POA: Diagnosis present

## 2024-04-04 DIAGNOSIS — Z8501 Personal history of malignant neoplasm of esophagus: Secondary | ICD-10-CM

## 2024-04-04 DIAGNOSIS — Z803 Family history of malignant neoplasm of breast: Secondary | ICD-10-CM

## 2024-04-04 DIAGNOSIS — I9581 Postprocedural hypotension: Secondary | ICD-10-CM

## 2024-04-04 DIAGNOSIS — Z923 Personal history of irradiation: Secondary | ICD-10-CM | POA: Diagnosis not present

## 2024-04-04 DIAGNOSIS — E785 Hyperlipidemia, unspecified: Secondary | ICD-10-CM | POA: Diagnosis present

## 2024-04-04 DIAGNOSIS — Z7902 Long term (current) use of antithrombotics/antiplatelets: Secondary | ICD-10-CM | POA: Diagnosis not present

## 2024-04-04 DIAGNOSIS — Z95828 Presence of other vascular implants and grafts: Secondary | ICD-10-CM | POA: Diagnosis not present

## 2024-04-04 DIAGNOSIS — Z96641 Presence of right artificial hip joint: Secondary | ICD-10-CM | POA: Diagnosis present

## 2024-04-04 DIAGNOSIS — Z87891 Personal history of nicotine dependence: Secondary | ICD-10-CM | POA: Diagnosis not present

## 2024-04-04 DIAGNOSIS — Z801 Family history of malignant neoplasm of trachea, bronchus and lung: Secondary | ICD-10-CM | POA: Diagnosis not present

## 2024-04-04 DIAGNOSIS — Z7901 Long term (current) use of anticoagulants: Secondary | ICD-10-CM

## 2024-04-04 DIAGNOSIS — I493 Ventricular premature depolarization: Secondary | ICD-10-CM | POA: Diagnosis not present

## 2024-04-04 DIAGNOSIS — Z9221 Personal history of antineoplastic chemotherapy: Secondary | ICD-10-CM

## 2024-04-04 DIAGNOSIS — Z981 Arthrodesis status: Secondary | ICD-10-CM | POA: Diagnosis not present

## 2024-04-04 DIAGNOSIS — Z85810 Personal history of malignant neoplasm of tongue: Secondary | ICD-10-CM | POA: Diagnosis not present

## 2024-04-04 DIAGNOSIS — R008 Other abnormalities of heart beat: Secondary | ICD-10-CM | POA: Diagnosis not present

## 2024-04-04 DIAGNOSIS — I6521 Occlusion and stenosis of right carotid artery: Principal | ICD-10-CM | POA: Diagnosis present

## 2024-04-04 DIAGNOSIS — K219 Gastro-esophageal reflux disease without esophagitis: Secondary | ICD-10-CM | POA: Diagnosis present

## 2024-04-04 DIAGNOSIS — Z8249 Family history of ischemic heart disease and other diseases of the circulatory system: Secondary | ICD-10-CM

## 2024-04-04 DIAGNOSIS — Z9889 Other specified postprocedural states: Secondary | ICD-10-CM | POA: Diagnosis not present

## 2024-04-04 HISTORY — PX: CAROTID PTA/STENT INTERVENTION: CATH118231

## 2024-04-04 LAB — GLUCOSE, CAPILLARY
Glucose-Capillary: 167 mg/dL — ABNORMAL HIGH (ref 70–99)
Glucose-Capillary: 145 mg/dL — ABNORMAL HIGH (ref 70–99)
Glucose-Capillary: 116 mg/dL — ABNORMAL HIGH (ref 70–99)

## 2024-04-04 LAB — BUN: BUN: 21 mg/dL (ref 8–23)

## 2024-04-04 LAB — CREATININE, SERUM
Creatinine, Ser: 1.64 mg/dL — ABNORMAL HIGH (ref 0.61–1.24)
GFR, Estimated: 43 mL/min — ABNORMAL LOW

## 2024-04-04 LAB — POCT ACTIVATED CLOTTING TIME: Activated Clotting Time: 255 s

## 2024-04-04 SURGERY — CAROTID PTA/STENT INTERVENTION
Anesthesia: Moderate Sedation | Laterality: Right

## 2024-04-04 MED ORDER — PHENYLEPHRINE 80 MCG/ML (10ML) SYRINGE FOR IV PUSH (FOR BLOOD PRESSURE SUPPORT)
PREFILLED_SYRINGE | INTRAVENOUS | Status: AC
  Filled 2024-04-04: qty 10

## 2024-04-04 MED ORDER — TAMSULOSIN HCL 0.4 MG PO CAPS
0.4000 mg | ORAL_CAPSULE | Freq: Every day | ORAL | Status: DC
  Administered 2024-04-05 – 2024-04-06 (×2): 0.4 mg via ORAL
  Filled 2024-04-04 (×2): qty 1

## 2024-04-04 MED ORDER — SODIUM CHLORIDE 0.9 % IV SOLN: INTRAVENOUS | Status: DC

## 2024-04-04 MED ORDER — LABETALOL HCL 5 MG/ML IV SOLN: 10.0000 mg | INTRAVENOUS | Status: DC | PRN

## 2024-04-04 MED ORDER — METHYLPREDNISOLONE SODIUM SUCC 125 MG IJ SOLR: 125.0000 mg | Freq: Once | INTRAMUSCULAR | Status: DC | PRN

## 2024-04-04 MED ORDER — NOREPINEPHRINE 4 MG/250ML-% IV SOLN
INTRAVENOUS | Status: AC
  Administered 2024-04-04: 10 ug/min via INTRAVENOUS
  Filled 2024-04-04: qty 250

## 2024-04-04 MED ORDER — POTASSIUM CHLORIDE CRYS ER 20 MEQ PO TBCR: 40.0000 meq | EXTENDED_RELEASE_TABLET | Freq: Every day | ORAL | Status: DC | PRN

## 2024-04-04 MED ORDER — CHLORHEXIDINE GLUCONATE CLOTH 2 % EX PADS
6.0000 | MEDICATED_PAD | Freq: Every day | CUTANEOUS | Status: DC
  Administered 2024-04-04 – 2024-04-06 (×3): 6 via TOPICAL

## 2024-04-04 MED ORDER — ACETAMINOPHEN 325 MG RE SUPP: 325.0000 mg | RECTAL | Status: DC | PRN

## 2024-04-04 MED ORDER — HYDRALAZINE HCL 20 MG/ML IJ SOLN: 5.0000 mg | INTRAMUSCULAR | Status: DC | PRN

## 2024-04-04 MED ORDER — FENTANYL CITRATE (PF) 100 MCG/2ML IJ SOLN
INTRAMUSCULAR | Status: AC
  Filled 2024-04-04: qty 2

## 2024-04-04 MED ORDER — LIDOCAINE-EPINEPHRINE (PF) 1 %-1:200000 IJ SOLN
INTRAMUSCULAR | Status: DC | PRN
  Administered 2024-04-04: 10 mL

## 2024-04-04 MED ORDER — LISINOPRIL 10 MG PO TABS
10.0000 mg | ORAL_TABLET | Freq: Every day | ORAL | Status: DC
  Administered 2024-04-06: 10 mg via ORAL
  Filled 2024-04-04: qty 2
  Filled 2024-04-04 (×3): qty 1

## 2024-04-04 MED ORDER — AZELASTINE HCL 0.1 % NA SOLN
2.0000 | Freq: Two times a day (BID) | NASAL | Status: DC
  Administered 2024-04-04 – 2024-04-06 (×3): 2 via NASAL
  Filled 2024-04-04: qty 30

## 2024-04-04 MED ORDER — METOPROLOL SUCCINATE ER 50 MG PO TB24
200.0000 mg | ORAL_TABLET | Freq: Every day | ORAL | Status: DC
  Administered 2024-04-06: 200 mg via ORAL
  Filled 2024-04-04: qty 4

## 2024-04-04 MED ORDER — VITAMIN B-12 1000 MCG PO TABS
1000.0000 ug | ORAL_TABLET | Freq: Every day | ORAL | Status: DC
  Administered 2024-04-04 – 2024-04-06 (×3): 1000 ug via ORAL
  Filled 2024-04-04 (×3): qty 1

## 2024-04-04 MED ORDER — DILTIAZEM HCL ER COATED BEADS 180 MG PO CP24
180.0000 mg | ORAL_CAPSULE | Freq: Every day | ORAL | Status: DC
  Administered 2024-04-06: 180 mg via ORAL
  Filled 2024-04-04 (×2): qty 1

## 2024-04-04 MED ORDER — ATROPINE SULFATE 1 MG/10ML IJ SOSY
PREFILLED_SYRINGE | INTRAMUSCULAR | Status: AC
  Filled 2024-04-04: qty 20

## 2024-04-04 MED ORDER — VENLAFAXINE HCL ER 75 MG PO CP24
75.0000 mg | ORAL_CAPSULE | Freq: Every day | ORAL | Status: DC
  Administered 2024-04-05 – 2024-04-06 (×2): 75 mg via ORAL
  Filled 2024-04-04 (×2): qty 1

## 2024-04-04 MED ORDER — PANTOPRAZOLE SODIUM 40 MG PO TBEC
40.0000 mg | DELAYED_RELEASE_TABLET | Freq: Every day | ORAL | Status: DC
  Administered 2024-04-05 – 2024-04-06 (×2): 40 mg via ORAL
  Filled 2024-04-04 (×2): qty 1

## 2024-04-04 MED ORDER — HEPARIN (PORCINE) IN NACL 2000-0.9 UNIT/L-% IV SOLN
INTRAVENOUS | Status: DC | PRN
  Administered 2024-04-04 (×2): 1000 mL

## 2024-04-04 MED ORDER — PHENYLEPHRINE HCL-NACL 20-0.9 MG/250ML-% IV SOLN
INTRAVENOUS | Status: AC
  Filled 2024-04-04: qty 250

## 2024-04-04 MED ORDER — SODIUM CHLORIDE 0.9% FLUSH
10.0000 mL | Freq: Two times a day (BID) | INTRAVENOUS | Status: DC
  Administered 2024-04-04 – 2024-04-05 (×2): 10 mL
  Administered 2024-04-05: 20 mL
  Administered 2024-04-06: 10 mL

## 2024-04-04 MED ORDER — PHENYLEPHRINE HCL-NACL 20-0.9 MG/250ML-% IV SOLN
25.0000 ug/min | INTRAVENOUS | Status: DC
  Filled 2024-04-04: qty 250

## 2024-04-04 MED ORDER — APIXABAN 5 MG PO TABS
5.0000 mg | ORAL_TABLET | Freq: Two times a day (BID) | ORAL | Status: DC
  Administered 2024-04-04 – 2024-04-06 (×4): 5 mg via ORAL
  Filled 2024-04-04 (×4): qty 1

## 2024-04-04 MED ORDER — ONDANSETRON HCL 4 MG/2ML IJ SOLN
4.0000 mg | Freq: Four times a day (QID) | INTRAMUSCULAR | Status: DC | PRN
  Administered 2024-04-04: 4 mg via INTRAVENOUS

## 2024-04-04 MED ORDER — UMECLIDINIUM BROMIDE 62.5 MCG/ACT IN AEPB
2.0000 | INHALATION_SPRAY | Freq: Every day | RESPIRATORY_TRACT | Status: DC
  Filled 2024-04-04: qty 7

## 2024-04-04 MED ORDER — SODIUM CHLORIDE 0.9 % IV SOLN
500.0000 mL | Freq: Once | INTRAVENOUS | Status: AC | PRN
  Administered 2024-04-04: 500 mL via INTRAVENOUS

## 2024-04-04 MED ORDER — SODIUM CHLORIDE 0.9 % IV SOLN: INTRAVENOUS | Status: AC | PRN

## 2024-04-04 MED ORDER — CEFAZOLIN SODIUM-DEXTROSE 2-4 GM/100ML-% IV SOLN
INTRAVENOUS | Status: AC
  Filled 2024-04-04: qty 100

## 2024-04-04 MED ORDER — SODIUM CHLORIDE 0.9 % IV SOLN: 250.0000 mL | INTRAVENOUS | Status: AC

## 2024-04-04 MED ORDER — SODIUM CHLORIDE 0.9 % IV SOLN
500.0000 mL | Freq: Once | INTRAVENOUS | Status: AC | PRN
  Administered 2024-04-04 (×2): 500 mL via INTRAVENOUS

## 2024-04-04 MED ORDER — FENTANYL CITRATE (PF) 100 MCG/2ML IJ SOLN
INTRAMUSCULAR | Status: DC | PRN
  Administered 2024-04-04: 50 ug via INTRAVENOUS

## 2024-04-04 MED ORDER — IBUPROFEN 800 MG PO TABS: 800.0000 mg | ORAL_TABLET | Freq: Four times a day (QID) | ORAL | Status: DC | PRN

## 2024-04-04 MED ORDER — SODIUM CHLORIDE 0.9% FLUSH: 10.0000 mL | INTRAVENOUS | Status: DC | PRN

## 2024-04-04 MED ORDER — HEPARIN SODIUM (PORCINE) 1000 UNIT/ML IJ SOLN
INTRAMUSCULAR | Status: AC
  Filled 2024-04-04: qty 10

## 2024-04-04 MED ORDER — ACETAMINOPHEN 325 MG PO TABS: 325.0000 mg | ORAL_TABLET | ORAL | Status: DC | PRN

## 2024-04-04 MED ORDER — MIDAZOLAM HCL 5 MG/5ML IJ SOLN
INTRAMUSCULAR | Status: AC
  Filled 2024-04-04: qty 5

## 2024-04-04 MED ORDER — METOPROLOL TARTRATE 5 MG/5ML IV SOLN: 2.5000 mg | INTRAVENOUS | Status: DC | PRN

## 2024-04-04 MED ORDER — FAMOTIDINE 20 MG PO TABS: 40.0000 mg | ORAL_TABLET | Freq: Once | ORAL | Status: DC | PRN

## 2024-04-04 MED ORDER — MIDAZOLAM HCL 2 MG/ML PO SYRP: 8.0000 mg | ORAL_SOLUTION | Freq: Once | ORAL | Status: DC | PRN

## 2024-04-04 MED ORDER — MUPIROCIN 2 % EX OINT
1.0000 | TOPICAL_OINTMENT | Freq: Two times a day (BID) | CUTANEOUS | Status: DC
  Administered 2024-04-04 – 2024-04-06 (×4): 1 via NASAL
  Filled 2024-04-04: qty 22

## 2024-04-04 MED ORDER — ROSUVASTATIN CALCIUM 10 MG PO TABS
10.0000 mg | ORAL_TABLET | Freq: Every day | ORAL | Status: DC
  Administered 2024-04-05 – 2024-04-06 (×2): 10 mg via ORAL
  Filled 2024-04-04 (×2): qty 1

## 2024-04-04 MED ORDER — MIRTAZAPINE 15 MG PO TABS
15.0000 mg | ORAL_TABLET | Freq: Every day | ORAL | Status: DC
  Administered 2024-04-04 – 2024-04-05 (×2): 15 mg via ORAL
  Filled 2024-04-04 (×2): qty 1

## 2024-04-04 MED ORDER — IODIXANOL 320 MG/ML IV SOLN
INTRAVENOUS | Status: DC | PRN
  Administered 2024-04-04: 65 mL via INTRA_ARTERIAL

## 2024-04-04 MED ORDER — DIPHENHYDRAMINE HCL 50 MG/ML IJ SOLN: 50.0000 mg | Freq: Once | INTRAMUSCULAR | Status: DC | PRN

## 2024-04-04 MED ORDER — DOPAMINE-DEXTROSE 3.2-5 MG/ML-% IV SOLN
0.0000 ug/kg/min | INTRAVENOUS | Status: DC
  Administered 2024-04-04: 5 ug/kg/min via INTRAVENOUS
  Filled 2024-04-04: qty 250

## 2024-04-04 MED ORDER — ATROPINE SULFATE 1 MG/10ML IJ SOSY
PREFILLED_SYRINGE | INTRAMUSCULAR | Status: DC | PRN
  Administered 2024-04-04: 1 mg via INTRAVENOUS

## 2024-04-04 MED ORDER — ORAL CARE MOUTH RINSE: 15.0000 mL | OROMUCOSAL | Status: DC | PRN

## 2024-04-04 MED ORDER — TRAMADOL HCL 50 MG PO TABS: 50.0000 mg | ORAL_TABLET | Freq: Four times a day (QID) | ORAL | Status: DC | PRN

## 2024-04-04 MED ORDER — MIDAZOLAM HCL (PF) 2 MG/2ML IJ SOLN
INTRAMUSCULAR | Status: DC | PRN
  Administered 2024-04-04: 1 mg via INTRAVENOUS

## 2024-04-04 MED ORDER — HEPARIN SODIUM (PORCINE) 1000 UNIT/ML IJ SOLN
INTRAMUSCULAR | Status: DC | PRN
  Administered 2024-04-04: 7000 [IU] via INTRAVENOUS
  Administered 2024-04-04: 2000 [IU] via INTRAVENOUS

## 2024-04-04 MED ORDER — PHENOL 1.4 % MT LIQD: 1.0000 | OROMUCOSAL | Status: DC | PRN

## 2024-04-04 MED ORDER — CLOPIDOGREL BISULFATE 75 MG PO TABS
75.0000 mg | ORAL_TABLET | Freq: Every day | ORAL | Status: DC
  Administered 2024-04-04 – 2024-04-06 (×3): 75 mg via ORAL
  Filled 2024-04-04 (×3): qty 1

## 2024-04-04 MED ORDER — MORPHINE SULFATE (PF) 2 MG/ML IV SOLN: 2.0000 mg | INTRAVENOUS | Status: DC | PRN

## 2024-04-04 MED ORDER — NOREPINEPHRINE 4 MG/250ML-% IV SOLN: 0.0000 ug/min | INTRAVENOUS | Status: DC

## 2024-04-04 MED ORDER — CEFAZOLIN SODIUM-DEXTROSE 2-4 GM/100ML-% IV SOLN
2.0000 g | Freq: Three times a day (TID) | INTRAVENOUS | Status: AC
  Administered 2024-04-04 – 2024-04-05 (×2): 2 g via INTRAVENOUS
  Filled 2024-04-04 (×4): qty 100

## 2024-04-04 MED ORDER — CEFAZOLIN SODIUM-DEXTROSE 2-4 GM/100ML-% IV SOLN
2.0000 g | INTRAVENOUS | Status: AC
  Administered 2024-04-04: 2 g via INTRAVENOUS

## 2024-04-04 MED ORDER — ONDANSETRON HCL 4 MG/2ML IJ SOLN
4.0000 mg | Freq: Four times a day (QID) | INTRAMUSCULAR | Status: DC | PRN
  Filled 2024-04-04: qty 2

## 2024-04-04 MED ORDER — HYDROMORPHONE HCL 1 MG/ML IJ SOLN: 1.0000 mg | Freq: Once | INTRAMUSCULAR | Status: DC | PRN

## 2024-04-04 SURGICAL SUPPLY — 18 items
BALLOON LUTONIX 018 4X40X130 (BALLOONS) IMPLANT
CATH ANGIO 5F PIGTAIL 100CM (CATHETERS) IMPLANT
CATH BEACON 5 .035 100 H1 TIP (CATHETERS) IMPLANT
COVER DRAPE FLUORO 36X44 (DRAPES) IMPLANT
COVER PROBE ULTRASOUND 5X96 (MISCELLANEOUS) IMPLANT
DEVICE EMBOSHIELD NAV6 4.0-7.0 (FILTER) IMPLANT
DEVICE PRESTO INFLATION (MISCELLANEOUS) IMPLANT
DEVICE STARCLOSE SE CLOSURE (Vascular Products) IMPLANT
GLIDEWIRE ANGLED SS 035X260CM (WIRE) IMPLANT
KIT CAROTID MANIFOLD (MISCELLANEOUS) IMPLANT
PACK ANGIOGRAPHY (CUSTOM PROCEDURE TRAY) ×1 IMPLANT
SHEATH BRITE TIP 6FRX11 (SHEATH) IMPLANT
SHEATH SHUTTLE 6FRX80 (SHEATH) IMPLANT
STENT XACT 7X30 (Permanent Stent) IMPLANT
SUT MNCRL AB 4-0 PS2 18 (SUTURE) IMPLANT
SYR MEDRAD MARK 7 150ML (SYRINGE) IMPLANT
WIRE G VAS 035X260 STIFF (WIRE) IMPLANT
WIRE J 3MM .035X145CM (WIRE) IMPLANT

## 2024-04-04 NOTE — Plan of Care (Signed)

## 2024-04-04 NOTE — Op Note (Signed)
 "   OPERATIVE NOTE DATE: 04/04/2024  PROCEDURE:  Ultrasound guidance for vascular access right femoral artery  Placement of a 7 mm diameter by 3 cm length exact stent with the use of the NAV-6 embolic protection device in the right internal carotid artery  PRE-OPERATIVE DIAGNOSIS: 1.  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 2.  Previous head neck radiation  POST-OPERATIVE DIAGNOSIS:  Same as above  SURGEON: Selinda Gu, MD  ASSISTANT(S): None  ANESTHESIA: local/MCS  ESTIMATED BLOOD LOSS: 25 cc  CONTRAST: 65 cc  FLUORO TIME: 5 minutes  MODERATE CONSCIOUS SEDATION TIME:  Approximately 37 minutes using 1 mg of Versed  and 50 mcg of Fentanyl   FINDING(S): 1.   90% right internal carotid artery stenosis distal to the previously placed stent  SPECIMEN(S):   none  INDICATIONS:   Patient is a 79 y.o. male who presents with recurrent right internal carotid artery stenosis.  The patient has had a previous stent and has also had head and neck radiation in the recurrent stenosis is distal to the previously placed stent and carotid artery stenting was felt to be preferred to endarterectomy for that reason. I have completed Share Decision Making with Nancyann LITTIE Stager prior to surgery.  Conversations included: -Discussion of all treatment options including carotid endarterectomy (CEA), CAS (which includes transcarotid artery revascularization (TCAR)), and optimal medical therapy (OMT)). -Explanation of risks and benefits for each option specific to [employer] clinical situation. -Integration of clinical guidelines as it relates to the patient's history and co-morbidities -Discussion and incorporation of Nancyann LITTIE Stager and their personal preferences and priorities in choosing a treatment plan.  If patient was unable to participate in Shared Decision Making this process was done with the patient  Risks and benefits were discussed and informed  consent was obtained.   DESCRIPTION: After obtaining full informed written consent, the patient was brought back to the vascular suite and placed supine upon the table.  The patient received IV antibiotics prior to induction. Moderate conscious sedation was administered during a face to face encounter with the patient throughout the procedure with my supervision of the RN administering medicines and monitoring the patients vital signs and mental status throughout from the start of the procedure until the patient was taken to the recovery room.  After obtaining adequate anesthesia, the patient was prepped and draped in the standard fashion.   The right femoral artery was visualized with ultrasound and found to be widely patent. It was then accessed under direct ultrasound guidance without difficulty with a Seldinger needle. A permanent image was recorded. A J-wire was placed and we then placed a 6 French sheath. The patient was then heparinized and a total of 9000 units of intravenous heparin  were given and an ACT was checked to confirm successful anticoagulation. A pigtail catheter was then placed into the ascending aorta. This showed a type I aortic arch without proximal stenosis and a normal configuration. I then selectively cannulated the innominate artery without difficulty with a headhunter catheter and advanced into the mid right common carotid artery.  Cervical and cerebral carotid angiography was then performed. There were no obvious intracranial filling defects with some right-to-left cross-filling. The carotid bifurcation demonstrated a stent across the bifurcation into the proximal internal carotid artery which it itself was patent.  However, just distal to the previously placed stent was about a 90% stenosis.  I then advanced into the external carotid artery with a Glidewire and the headhunter  catheter and then exchanged for the Amplatz Super Stiff wire. Over the Amplatz Super Stiff wire, a 6 French  shuttle sheath was placed into the mid common carotid artery. I then used the NAV-6  Embolic protection device and crossed the lesion and parked this in the distal internal carotid artery at the base of the skull.  I then selected a 7 mm diameter by 3 cm length exact stent. This was deployed across the lesion encompassing it in its entirety.  This also went back into the previously placed stent about 1/2-1 full centimeter.  A 4 mm diameter by 4 cm Lutonix drug-coated angioplasty balloon was used to post dilate the stent. Only about a 10% residual stenosis was present after angioplasty. Completion angiogram showed normal intracranial filling without new defects. At this point I elected to terminate the procedure. The sheath was removed and StarClose closure device was deployed in the right femoral artery with excellent hemostatic result. The patient was taken to the recovery room in stable condition having tolerated the procedure well.  COMPLICATIONS: none  CONDITION: stable  Selinda Gu 04/04/2024 11:56 AM   This note was created with Dragon Medical transcription system. Any errors in dictation are purely unintentional. "

## 2024-04-04 NOTE — H&P (Signed)
 " [facility] Admission History & Physical  MRN : [id number]  Blake [name] is a 79 y.o. (09-19-1945) male who presents with chief complaint of No chief complaint on file. SABRA  History of Present Illness: Blake [name] is a 79 year old male with carotid artery disease who presents for follow-up.   He recently had a syncopal episode while fixing coffee, with associated dizziness. No cause was identified.  Did not have associated arm or leg weakness or numbness, speech or swallowing difficulty, or temporary monocular blindness   He previously underwent carotid artery stenting.  Today's carotid duplex ultrasound shows worsening velocities just above the stent in the high-grade 80-99% range, with left carotid stenosis in the 60-79% range. His prior radiation therapy increases his risk of restenosis.  No current facility-administered medications for this encounter.    Past Medical History:  Diagnosis Date   Actinic keratosis    Arthritis    Back pain    with leg pain   Basal cell carcinoma 09/04/2009   R cheek 5.5 cm ant to earlobe - 12/26/2009 excision   Basal cell carcinoma 09/04/2009   R ant nasal alar rim   Basal cell carcinoma 07/31/2014   R mid brow   Basal cell carcinoma 07/30/2016   R ant nasal alar rim at ant edge of BCC scar   Basal cell carcinoma 11/02/2019   L zygoma    Basal cell carcinoma 07/22/2021   left sideburn area, EDC   Basal cell carcinoma 01/02/2022   Right posterior shoulder, [facility]C   Basal cell carcinoma 01/02/2022   R posterior shoulder, EDC   Basal cell carcinoma 01/02/2022   Right Supraclavicular Base of Neck, EDC   Basal cell carcinoma 06/15/2023   Left mid cheek. Nodular pattern. Mohs with Dr. Corey 07/14/23   Basal cell carcinoma 06/15/2023   Left preauricular. Nodular pattern. Mohs with Dr. Corey.   Basal cell carcinoma 06/15/2023   Left post auricular. Nodular pattern. Mohs with Dr. Corey.   Basal cell carcinoma 07/21/2023    Right cheek. Nodular BCC. Mohs wiht Dr. Corey.   Cancer of base of tongue (HCC) 2006   s/p chemoradiation   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, bilateral    Dyspnea    Dysrhythmia    Esophageal cancer (HCC) 2021   GERD (gastroesophageal reflux disease)    Hyperlipidemia    Hypertension    Numbness and tingling of both lower extremities    with positioning   Paroxysmal atrial fibrillation (HCC)    Port-A-Cath in place 2006   Prostate enlargement    Squamous cell carcinoma of skin 12/14/2019   L pretibial - ED&C    T2DM (type 2 diabetes mellitus) (HCC)     Past Surgical History:  Procedure Laterality Date   ABDOMINAL SURGERY     CAROTID PTA/STENT INTERVENTION Right 07/04/2021   Procedure: CAROTID PTA/STENT INTERVENTION;  Surgeon: Marea Selinda RAMAN, MD;  Location: [facility];  Service: Cardiovascular;  Laterality: Right;   COLONOSCOPY  12/08/2003   COLONOSCOPY WITH PROPOFOL  N/A 03/21/2019   Procedure: COLONOSCOPY WITH PROPOFOL ;  Surgeon: Toledo, Ladell POUR, MD;  Location: [facility];  Service: Gastroenterology;  Laterality: N/A;   ESOPHAGOGASTRODUODENOSCOPY (EGD) WITH PROPOFOL  N/A 03/21/2019   Procedure: ESOPHAGOGASTRODUODENOSCOPY (EGD) WITH PROPOFOL ;  Surgeon: Toledo, Ladell POUR, MD;  Location: [facility];  Service: Gastroenterology;  Laterality: N/A;   esophogeal cancer     JOINT REPLACEMENT Right    total hip  LUMBAR LAMINECTOMY/DECOMPRESSION MICRODISCECTOMY N/A 03/03/2018   Procedure: LUMBAR LAMINECTOMY/DECOMPRESSION MICRODISCECTOMY 1 LEVEL-L3-4,L4-5;  Surgeon: Clois Fret, MD;  Location: [facility];  Service: Neurosurgery;  Laterality: N/A;   LUMBAR LAMINECTOMY/DECOMPRESSION MICRODISCECTOMY N/A 02/24/2020   Procedure: LEFT L3-4 MICRODISCECTOMY, L4-5 DECOMPRESSION;  Surgeon: Clois Fret, MD;  Location: [facility];  Service: Neurosurgery;  Laterality: N/A;   MAXIMUM ACCESS (MAS) TRANSFORAMINAL LUMBAR INTERBODY FUSION (TLIF) 1 LEVEL N/A 07/13/2020    Procedure: OPEN L3-4 TRANSFORAMINAL LUMBAR INTERBODY FUSION (TLIF);  Surgeon: Clois Fret, MD;  Location: [facility];  Service: Neurosurgery;  Laterality: N/A;   PARTIAL HIP ARTHROPLASTY Right 2014   PORT-A-CATH REMOVAL     PORTACATH PLACEMENT Left 01/12/2020   Procedure: INSERTION PORT-A-CATH, with ultrasound fluoroscopy;  Surgeon: Volney Lye, MD;  Location: [facility] ORS;  Service: General;  Laterality: Left;   TONGUE BIOPSY  2006   TONSILLECTOMY     TRIGGER FINGER RELEASE Right 2013   UPPER GI ENDOSCOPY  01/25/14   multiple gastric polyps     Social History[1]   Family History  Problem Relation Age of Onset   Diabetes Mother    Congestive Heart Failure Mother    Breast cancer Mother    Lung cancer Father     Allergies[2]   REVIEW OF SYSTEMS (Negative unless checked)   Constitutional: [] Weight loss  [] Fever  [] Chills Cardiac: [] Chest pain   [] Chest pressure   [x] Palpitations   [] Shortness of breath when laying flat   [] Shortness of breath at rest   [] Shortness of breath with exertion. Vascular:  [] Pain in legs with walking   [] Pain in legs at rest   [] Pain in legs when laying flat   [] Claudication   [] Pain in feet when walking  [] Pain in feet at rest  [] Pain in feet when laying flat   [] History of DVT   [] Phlebitis   [] Swelling in legs   [] Varicose veins   [] Non-healing ulcers Pulmonary:   [] Uses home oxygen   [] Productive cough   [] Hemoptysis   [] Wheeze  [] COPD   [] Asthma Neurologic:  [] Dizziness  [x] Blackouts   [] Seizures   [x] History of stroke   [] History of TIA  [] Aphasia   [] Temporary blindness   [] Dysphagia   [] Weakness or numbness in arms   [] Weakness or numbness in legs Musculoskeletal:  [x] Arthritis   [] Joint swelling   [] Joint pain   [x] Low back pain Hematologic:  [] Easy bruising  [] Easy bleeding   [] Hypercoagulable state   [] Anemic  [] Hepatitis Gastrointestinal:  [] Blood in stool   [] Vomiting blood  [x] Gastroesophageal reflux/heartburn   [] Difficulty  swallowing. Genitourinary:  [] Chronic kidney disease   [] Difficult urination  [x] Frequent urination  [] Burning with urination   [] Blood in urine Skin:  [] Rashes   [] Ulcers   [] Wounds Psychological:  [] History of anxiety   []  History of major depression.  Physical Examination  There were no vitals filed for this visit. There is no height or weight on file to calculate BMI. Gen: WD/WN, NAD Head: /AT, No temporalis wasting.  Ear/Nose/Throat: Hearing grossly intact, nares w/o erythema or drainage, oropharynx w/o Erythema/Exudate,  Eyes: Conjunctiva clear, sclera non-icteric Neck: Trachea midline.  No JVD.  Pulmonary:  Good air movement, respirations not labored, no use of accessory muscles.  Cardiac: RRR, normal S1, S2. Vascular:  Vessel Right Left  Radial Palpable Palpable           Musculoskeletal: M/S 5/5 throughout.  Extremities without ischemic changes.  No deformity or atrophy.  Neurologic: Sensation grossly intact in extremities.  Symmetrical.  Speech is fluent. Motor exam as listed above. Psychiatric: Judgment intact, Mood & affect appropriate for pt's clinical situation. Dermatologic: No rashes or ulcers noted.  No cellulitis or open wounds.      CBC Lab Results  Component Value Date   WBC 8.9 12/28/2023   HGB 14.4 12/28/2023   HCT 46.2 12/28/2023   MCV 93.5 12/28/2023   PLT 226 12/28/2023    BMET    Component Value Date/Time   NA 140 12/28/2023 1227   NA 130 (L) 01/26/2013 0505   K 4.2 12/28/2023 1227   K 4.6 01/26/2013 0505   CL 105 12/28/2023 1227   CL 101 01/26/2013 0505   CO2 26 12/28/2023 1227   CO2 26 01/26/2013 0505   GLUCOSE 162 (H) 12/28/2023 1227   GLUCOSE 138 (H) 01/26/2013 0505   BUN 16 12/28/2023 1227   BUN 19 (H) 01/26/2013 0505   CREATININE 1.04 12/28/2023 1227   CREATININE 1.11 10/28/2023 1317   CREATININE 1.18 01/26/2013 0505   CALCIUM  8.6 (L) 12/28/2023 1227   CALCIUM  8.3 (L) 01/26/2013 0505   GFRNONAA >60 12/28/2023 1227    GFRNONAA >60 10/28/2023 1317   GFRNONAA >60 01/26/2013 0505   GFRAA >60 12/28/2019 1328   GFRAA >60 01/26/2013 0505   CrCl cannot be calculated (Patient's most recent lab result is older than the maximum 21 days allowed.).  COAG Lab Results  Component Value Date   INR 1.0 12/28/2023   INR 1.0 04/25/2022   INR 1.0 07/04/2020    Radiology No results found.   Assessment/Plan Bilateral carotid artery stenosis with recurrent right sided high grade restenosis Worsening stenosis with high-grade velocity changes above the stent and moderate changes on the left. Increased recurrence risk due to prior neck irradiation. With recurrence and previous history of neck radiation, endovascular intervention would be preferred over open surgical therapy. - Initiate Plavix  5 days prior to procedure. - Continue Eliquis . - Monitor for symptoms such as arm or leg weakness, numbness, face droop, or speech difficulties and seek emergency care if he occurs.   Essential (primary) hypertension blood pressure control important in reducing the progression of atherosclerotic disease. On appropriate oral medications.     Diabetes mellitus (HCC) blood glucose control important in reducing the progression of atherosclerotic disease. Also, involved in wound healing. On appropriate medications.     HLD (hyperlipidemia) lipid control important in reducing the progression of atherosclerotic disease. Continue statin therapy   Tongue cancer (HCC) History of radiation making him more likely to have recurrent carotid stenosis  Selinda Gu, MD  04/04/2024 9:02 AM        [1]  Social History Tobacco Use   Smoking status: Former    Current packs/day: 0.00    Average packs/day: 1 pack/day for 30.0 years (30.0 ttl pk-yrs)    Types: Cigarettes    Start date: 02/19/1964    Quit date: 02/18/1994    Years since quitting: 30.1   Smokeless tobacco: Never  Vaping Use   Vaping status: Never Used  Substance Use  Topics   Alcohol use: Not Currently   Drug use: No  [2]  Allergies Allergen Reactions   Sulfa Antibiotics Rash   "

## 2024-04-05 ENCOUNTER — Other Ambulatory Visit (HOSPITAL_COMMUNITY): Payer: Self-pay

## 2024-04-05 DIAGNOSIS — I9581 Postprocedural hypotension: Secondary | ICD-10-CM

## 2024-04-05 DIAGNOSIS — Z95828 Presence of other vascular implants and grafts: Secondary | ICD-10-CM

## 2024-04-05 DIAGNOSIS — Z9889 Other specified postprocedural states: Secondary | ICD-10-CM

## 2024-04-05 DIAGNOSIS — I6521 Occlusion and stenosis of right carotid artery: Secondary | ICD-10-CM

## 2024-04-05 LAB — CBC
HCT: 31.7 % — ABNORMAL LOW (ref 39.0–52.0)
Hemoglobin: 10.1 g/dL — ABNORMAL LOW (ref 13.0–17.0)
MCH: 29.7 pg (ref 26.0–34.0)
MCHC: 31.9 g/dL (ref 30.0–36.0)
MCV: 93.2 fL (ref 80.0–100.0)
Platelets: 220 K/uL (ref 150–400)
RBC: 3.4 MIL/uL — ABNORMAL LOW (ref 4.22–5.81)
RDW: 13.4 % (ref 11.5–15.5)
WBC: 9.1 K/uL (ref 4.0–10.5)
nRBC: 0 % (ref 0.0–0.2)

## 2024-04-05 LAB — BASIC METABOLIC PANEL WITH GFR
Anion gap: 8 (ref 5–15)
BUN: 20 mg/dL (ref 8–23)
CO2: 24 mmol/L (ref 22–32)
Calcium: 7.9 mg/dL — ABNORMAL LOW (ref 8.9–10.3)
Chloride: 107 mmol/L (ref 98–111)
Creatinine, Ser: 1.62 mg/dL — ABNORMAL HIGH (ref 0.61–1.24)
GFR, Estimated: 43 mL/min — ABNORMAL LOW
Glucose, Bld: 154 mg/dL — ABNORMAL HIGH (ref 70–99)
Potassium: 4.5 mmol/L (ref 3.5–5.1)
Sodium: 140 mmol/L (ref 135–145)

## 2024-04-05 LAB — GLUCOSE, CAPILLARY: Glucose-Capillary: 118 mg/dL — ABNORMAL HIGH (ref 70–99)

## 2024-04-05 NOTE — Progress Notes (Signed)
 " Progress Note    04/05/2024 7:37 AM 1 Day Post-Op  Subjective:  Blake [name] is a 79 yo male who is POD #1 from:  DATE: 04/04/2024   PROCEDURE:  Ultrasound guidance for vascular access right femoral artery  Placement of a 7 mm diameter by 3 cm length exact stent with the use of the NAV-6 embolic protection device in the right internal carotid artery   PRE-OPERATIVE DIAGNOSIS: 1.  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 2.  Previous head neck radiation   POST-OPERATIVE DIAGNOSIS:  Same as above   SURGEON: Selinda Gu, MD   ASSISTANT(S): None   ANESTHESIA: local/MCS   ESTIMATED BLOOD LOSS: 25 cc   CONTRAST: 65 cc   FLUORO TIME: 5 minutes   MODERATE CONSCIOUS SEDATION TIME:  Approximately 37 minutes using 1 mg of Versed  and 50 mcg of Fentanyl    FINDING(S): 1.   90% right internal carotid artery stenosis distal to the previously placed stent   Vitals:   04/05/24 0700 04/05/24 0715  BP: (!) 119/54   Pulse: (!) 54 (!) 59  Resp: (!) 23 18  Temp:    SpO2: 98% 98%   Physical Exam: Cardiac:  RRR, Normal S1, S2. No murmurs appreciated.  Lungs:  Clear throughout on auscultation. No rales rhonchi or wheezing noted. Non labored breathing.  Incisions:  Right groin puncture site with dressing clean dry and intact.  Extremities:  All extremities are warm to touch with palpable pulses.  Abdomen:  Positive bowel sounds throughout, soft non tender and non distended.  Neurologic: AAOX3, answers all questions and follows commands.   CBC    Component Value Date/Time   WBC 8.9 12/28/2023 1227   RBC 4.94 12/28/2023 1227   HGB 14.4 12/28/2023 1227   HGB 12.9 (L) 10/28/2023 1317   HGB 11.8 (L) 01/26/2013 0505   HCT 46.2 12/28/2023 1227   HCT 47.5 01/12/2013 1457   PLT 226 12/28/2023 1227   PLT 200 10/28/2023 1317   PLT 220 01/26/2013 0505   MCV 93.5 12/28/2023 1227   MCV 90 01/12/2013 1457   MCH 29.1 12/28/2023 1227   MCHC 31.2  12/28/2023 1227   RDW 13.2 12/28/2023 1227   RDW 13.7 01/12/2013 1457   LYMPHSABS 0.7 10/28/2023 1317   LYMPHSABS 0.9 (L) 08/11/2011 1440   MONOABS 0.5 10/28/2023 1317   MONOABS 0.4 08/11/2011 1440   EOSABS 0.1 10/28/2023 1317   EOSABS 0.0 08/11/2011 1440   BASOSABS 0.1 10/28/2023 1317   BASOSABS 0.0 08/11/2011 1440    BMET    Component Value Date/Time   NA 140 12/28/2023 1227   NA 130 (L) 01/26/2013 0505   K 4.2 12/28/2023 1227   K 4.6 01/26/2013 0505   CL 105 12/28/2023 1227   CL 101 01/26/2013 0505   CO2 26 12/28/2023 1227   CO2 26 01/26/2013 0505   GLUCOSE 162 (H) 12/28/2023 1227   GLUCOSE 138 (H) 01/26/2013 0505   BUN 21 04/04/2024 0929   BUN 19 (H) 01/26/2013 0505   CREATININE 1.64 (H) 04/04/2024 0929   CREATININE 1.11 10/28/2023 1317   CREATININE 1.18 01/26/2013 0505   CALCIUM  8.6 (L) 12/28/2023 1227   CALCIUM  8.3 (L) 01/26/2013 0505   GFRNONAA 43 (L) 04/04/2024 0929   GFRNONAA >60 10/28/2023 1317   GFRNONAA >60 01/26/2013 0505   GFRAA >60 12/28/2019 1328   GFRAA >60 01/26/2013 0505    INR    Component Value Date/Time   INR  1.0 12/28/2023 1227   INR 0.9 01/12/2013 1457     Intake/Output Summary (Last 24 hours) at 04/05/2024 0737 Last data filed at 04/05/2024 0400 Gross per 24 hour  Intake 2760.37 ml  Output 850 ml  Net 1910.37 ml     Assessment/Plan:  79 y.o. male is s/p SEE ABOVE 1 Day Post-Op   PLAN Patient can use on blood pressure support.  Levophed  at 2 mics.  I attempted to take him off of this this morning for about an hour or so but then he required due to continued hypotension.  Heart rates been in good support at rate of 60-65. Patient denies any nausea vomiting diarrhea dizziness headaches or any TIA/stroke symptoms.  Patient is ambulating in the room to use the toilet.  Eating well and urinating well. Will keep the patient in ICU as long as he requires blood pressure support.  Patient will require 1 more overnight stay.  If stable in the  morning and off of blood pressure support patient may discharge home.  I stopped all of his home blood pressure medications today.  Vascular surgery will continue to follow.  DVT prophylaxis: Eliquis  5 mg twice daily and Plavix  75 mg daily.   Gwendlyn JONELLE Shank [facility] 04/05/2024 7:37 AM   "

## 2024-04-05 NOTE — Progress Notes (Signed)
" °   04/05/24 1345  Spiritual Encounters  Type of Visit Initial  Care provided to: Patient  Referral source Chaplain assessment  Reason for visit Routine spiritual support  OnCall Visit Yes  Spiritual Framework  Presenting Themes Meaning/purpose/sources of inspiration;Goals in life/care;Impactful experiences and emotions  Patient Stress Factors Loss;Other (Comment) (Pt talked about losing his wife of many years.)  Interventions  Spiritual Care Interventions Made Established relationship of care and support;Compassionate presence;Reflective listening;Meaning making  Intervention Outcomes  Outcomes Connection to spiritual care;Awareness around self/spiritual resourses;Connection to values and goals of care;Awareness of health;Awareness of support    "

## 2024-04-05 NOTE — Progress Notes (Signed)
" °   04/04/24 2100  Provider Notification  Provider Name/Title Orvin Daring NP  Date Provider Notified 04/04/24  Time Provider Notified 2109  Method of Notification Page  Notification Reason Change in status (Patient BP below parameters, 76/28 (44). Patient is asymptomatic. This RN has given the prn 500cc bolus, with no improvement. 2nd bolus started.)  Provider response See new orders (Dopamine  gtt)  Date of Provider Response 04/04/24  Time of Provider Response 2110   SBP remains below parameters, another bolus dose ordered and given 2230. Bolus dose and dopamine  maxxed did not bring SBP within parameters. SBP remained in the 70s; patient having new uniforcal PVCs. Orders to change vasopressor to Levophed  from Dopaimine ; levophed  started at 10 per NP order. BP immediatley responsive to levophed . Provider ordered EKG with new orders, patient refused EKG. Patient said he is followed closely by cardiology and had a full cardiac workup recently, and his cardiologist is monitoring his heart. Patient remains neurologically intact and hypotension is resolved, he is back to being bradycardic which is his baseline. Orvin Daring NP, notified of EKG refusal.  "

## 2024-04-05 NOTE — Plan of Care (Signed)
  Problem: Education: Goal: Knowledge of General Education information will improve Description: Including pain rating scale, medication(s)/side effects and non-pharmacologic comfort measures Outcome: Progressing   Problem: Health Behavior/Discharge Planning: Goal: Ability to manage health-related needs will improve Outcome: Progressing   Problem: Clinical Measurements: Goal: Ability to maintain clinical measurements within normal limits will improve Outcome: Progressing Goal: Will remain free from infection Outcome: Progressing   Problem: Activity: Goal: Risk for activity intolerance will decrease Outcome: Progressing   Problem: Nutrition: Goal: Adequate nutrition will be maintained Outcome: Progressing   Problem: Elimination: Goal: Will not experience complications related to bowel motility Outcome: Progressing Goal: Will not experience complications related to urinary retention Outcome: Progressing   Problem: Pain Managment: Goal: General experience of comfort will improve and/or be controlled Outcome: Progressing   Problem: Safety: Goal: Ability to remain free from injury will improve Outcome: Progressing   Problem: Skin Integrity: Goal: Risk for impaired skin integrity will decrease Outcome: Progressing

## 2024-04-06 ENCOUNTER — Other Ambulatory Visit: Payer: Self-pay

## 2024-04-06 ENCOUNTER — Other Ambulatory Visit (INDEPENDENT_AMBULATORY_CARE_PROVIDER_SITE_OTHER): Payer: Self-pay | Admitting: Nurse Practitioner

## 2024-04-06 DIAGNOSIS — I493 Ventricular premature depolarization: Secondary | ICD-10-CM

## 2024-04-06 LAB — BASIC METABOLIC PANEL WITH GFR
Anion gap: 10 (ref 5–15)
BUN: 20 mg/dL (ref 8–23)
CO2: 25 mmol/L (ref 22–32)
Calcium: 8.4 mg/dL — ABNORMAL LOW (ref 8.9–10.3)
Chloride: 107 mmol/L (ref 98–111)
Creatinine, Ser: 1.52 mg/dL — ABNORMAL HIGH (ref 0.61–1.24)
GFR, Estimated: 47 mL/min — ABNORMAL LOW
Glucose, Bld: 113 mg/dL — ABNORMAL HIGH (ref 70–99)
Potassium: 4.4 mmol/L (ref 3.5–5.1)
Sodium: 142 mmol/L (ref 135–145)

## 2024-04-06 MED ORDER — HEPARIN NA (PORK) LOCK FLSH PF 10 UNIT/ML IV SOLN
10.0000 [IU] | Freq: Once | INTRAVENOUS | Status: AC
  Administered 2024-04-06: 10 [IU]
  Filled 2024-04-06: qty 5

## 2024-04-06 MED ORDER — HYDROCODONE-ACETAMINOPHEN 5-325 MG PO TABS
1.0000 | ORAL_TABLET | Freq: Four times a day (QID) | ORAL | 0 refills | Status: AC | PRN
  Filled 2024-04-06: qty 20, 5d supply, fill #0

## 2024-04-06 NOTE — Progress Notes (Signed)
 Patient has not been discharged at this time but he is refusing telemetry. Patient is stable.

## 2024-04-06 NOTE — Plan of Care (Signed)
  Problem: Education: Goal: Knowledge of General Education information will improve Description: Including pain rating scale, medication(s)/side effects and non-pharmacologic comfort measures Outcome: Progressing   Problem: Health Behavior/Discharge Planning: Goal: Ability to manage health-related needs will improve Outcome: Progressing   Problem: Clinical Measurements: Goal: Will remain free from infection Outcome: Progressing Goal: Diagnostic test results will improve Outcome: Progressing Goal: Respiratory complications will improve Outcome: Progressing   Problem: Activity: Goal: Risk for activity intolerance will decrease Outcome: Progressing   Problem: Nutrition: Goal: Adequate nutrition will be maintained Outcome: Progressing   Problem: Coping: Goal: Level of anxiety will decrease Outcome: Progressing   Problem: Elimination: Goal: Will not experience complications related to urinary retention Outcome: Progressing

## 2024-04-06 NOTE — Progress Notes (Signed)
 Telemetry called at at 0918 this morning to report that patient was having frequent bigeminy with PVCs. EKG completed. Patient also stated that he did not feel good,  he said, I just don't feel right. Made vascular MD and NP aware.

## 2024-04-06 NOTE — Plan of Care (Signed)
  Problem: Clinical Measurements: Goal: Ability to maintain clinical measurements within normal limits will improve Outcome: Progressing Goal: Will remain free from infection Outcome: Progressing Goal: Diagnostic test results will improve Outcome: Progressing Goal: Cardiovascular complication will be avoided Outcome: Progressing   Problem: Nutrition: Goal: Adequate nutrition will be maintained Outcome: Progressing   

## 2024-04-06 NOTE — Progress Notes (Signed)
 I was alerted by the nursing staff that the patient was complaining of not feeling well.  She looked at the monitor and found that he is in a bigeminal rhythm.  A twelve-lead EKG was done and placed in the epic system.  I went to the room to check on the patient.  He is sitting at the bedside eating breakfast.  He states that he is feeling better now but on the monitor in the room he continues to have PVCs with intermittent periods of bigeminy.  Apparently he took half of his blood pressure medication this morning without eating and he states he normally has to take it while eating.  This is why he was sitting up eating breakfast so he can take the rest of his medications.  He denies any chest pain or shortness of breath at this time.  Denies any dizziness blurred vision or any weakness to his extremities.  Vitals all remained stable at this time.  We will continue to monitor.

## 2024-04-06 NOTE — Discharge Summary (Signed)
 "  [facility]    Discharge Summary    Patient ID:  Blake [name] MRN: [id number] DOB/AGE: [birth year] 79 y.o.  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]  Secondary Diagnoses: Past Medical History:  Diagnosis Date   Actinic keratosis    Arthritis    Back pain    with leg pain   Basal cell carcinoma 09/04/2009   R cheek 5.5 cm ant to earlobe - 12/26/2009 excision   Basal cell carcinoma 09/04/2009   R ant nasal alar rim   Basal cell carcinoma 07/31/2014   R mid brow   Basal cell carcinoma 07/30/2016   R ant nasal alar rim at ant edge of BCC scar   Basal cell carcinoma 11/02/2019   L zygoma    Basal cell carcinoma 07/22/2021   left sideburn area, EDC   Basal cell carcinoma 01/02/2022   Right posterior shoulder, EDC   Basal cell carcinoma 01/02/2022   R posterior shoulder, EDC   Basal cell carcinoma 01/02/2022   Right Supraclavicular Base of Neck, EDC   Basal cell carcinoma 06/15/2023   Left mid cheek. Nodular pattern. Mohs with Dr. Corey 07/14/23   Basal cell carcinoma 06/15/2023   Left preauricular. Nodular pattern. Mohs with Dr. Corey.   Basal cell carcinoma 06/15/2023   Left post auricular. Nodular pattern. Mohs with Dr. Corey.   Basal cell carcinoma 07/21/2023   Right cheek. Nodular BCC. Mohs wiht Dr. Corey.   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  Following stent placement he had issues with hypotension and required levophed  for a short time.  He also had episodes of bigemny with PVCs but this has resolved without chest pain or shortness of breath.  He is neurologically intact.    Hospital Course:  Blake [name] is a 79 y.o. male is S/P Right Carotid Stent Placement Extubated: POD # 0 Physical Exam:  Alert notes x3, no acute distress Face: Symmetrical.  Tongue is midline. Neck: Trachea is midline.  No swelling or bruising. Cardiovascular: Regular rate and rhythm Pulmonary: Clear to auscultation bilaterally Abdomen: Soft, nontender, nondistended Right groin access: Clean dry and intact.  No swelling or drainage noted Left groin access: Clean dry and intact.  No swelling or drainage noted Left lower extremity: Thigh soft.  Calf soft.  Extremities warm distally toes.  Hard to palpate pedal pulses however the foot is warm is her good capillary refill. Right lower extremity: Thigh soft.  Calf soft.  Extremities warm distally toes.  Hard to palpate pedal pulses however the foot is warm is her  good capillary refill. Neurological: No deficits noted   Post-op wounds:  clean, dry, intact or healing well  Pt. Ambulating, voiding and taking PO diet without difficulty. Pt pain controlled with PO pain meds.  Labs:  As below  Complications: Hypotension   Consults:    Significant Diagnostic Studies: CBC Lab Results  Component Value Date   WBC 9.1 04/05/2024   HGB 10.1 (L) 04/05/2024   HCT 31.7 (L) 04/05/2024   MCV 93.2 04/05/2024   PLT 220 04/05/2024    BMET    Component Value Date/Time   NA 142 04/06/2024 0500   NA 130 (L) 01/26/2013 0505   K 4.4 04/06/2024 0500   K  4.6 01/26/2013 0505   CL 107 04/06/2024 0500   CL 101 01/26/2013 0505   CO2 25 04/06/2024 0500   CO2 26 01/26/2013 0505   GLUCOSE 113 (H) 04/06/2024 0500   GLUCOSE 138 (H) 01/26/2013 0505   BUN 20 04/06/2024 0500   BUN 19 (H) 01/26/2013 0505   CREATININE 1.52 (H) 04/06/2024 0500   CREATININE 1.11 10/28/2023 1317   CREATININE 1.18 01/26/2013 0505   CALCIUM  8.4 (L) 04/06/2024 0500   CALCIUM  8.3 (L) 01/26/2013 0505   GFRNONAA 47 (L) 04/06/2024 0500   GFRNONAA >60 10/28/2023 1317   GFRNONAA >60 01/26/2013 0505   GFRAA >60 12/28/2019 1328   GFRAA >60 01/26/2013 0505   COAG Lab Results  Component Value Date   INR 1.0 12/28/2023   INR 1.0 04/25/2022   INR 1.0 07/04/2020     Disposition:  Discharge to :Home  Allergies as of 04/06/2024       Reactions   Sulfa Antibiotics Rash        Medication List     STOP taking these medications    amoxicillin  500 MG capsule Commonly known as: AMOXIL    ibuprofen  800 MG tablet Commonly known as: ADVIL    levofloxacin  500 MG tablet Commonly known as: LEVAQUIN    methocarbamol  500 MG tablet Commonly known as: ROBAXIN        TAKE these medications    azelastine  0.1 % nasal spray Commonly known as: ASTELIN  Place 2 sprays into both nostrils 2 (two) times daily. Use in each nostril as directed   chlorhexidine  0.12 % solution Commonly known as: Peridex  Rinse with 15 mLs by Mouth Rinse route 2 (two) times daily and spit out.   clopidogrel  75 MG tablet Commonly known as: PLAVIX  Take 1 tablet (75 mg total) by mouth daily. What changed: Another medication with the same name was removed. Continue taking this medication, and follow the directions you see here.   cyanocobalamin  1000 MCG tablet Take 1 tablet (1,000 mcg total) by mouth daily.   desvenlafaxine  50 MG 24 hr tablet Commonly known as: PRISTIQ  Take 1 tablet (50 mg total) by mouth daily.   diltiazem  180 MG 24 hr capsule Commonly known as: CARDIZEM  CD Take 1 capsule  (180 mg total) by mouth daily. Please hold until you see your cardiologist   Eliquis  5 MG Tabs tablet Generic drug: apixaban  Take 1 tablet (5 mg total) by mouth every 12 (twelve) hours.   erythromycin  ophthalmic ointment Place 1 Application into the right eye 3 (three) times daily for 10 days.   FeroSul 325 (65 Fe) MG tablet Generic drug: ferrous sulfate  Take 1 tablet (325 mg total) by mouth 2 (two) times daily with a meal.   fluocinonide -emollient 0.05 % cream Commonly known as: LIDEX -E Apply topically 2 (two) times a day if needed.  gatifloxacin  0.5 % Soln Commonly known as: ZYMAXID  Place 1 drop into the right eye 4 (four) times daily. Store upside down.   gatifloxacin  0.5 % Soln Commonly known as: ZYMAXID  Instill one (1) drop into the left eye four (4) times a day as directed. Store bottle upside down.   hydrocortisone  2.5 % cream Apply to affected areas on face 3 nights a week, Tuesday, Thursday and Saturdays  as needed   ketoconazole  2 % cream Commonly known as: NIZORAL  Apply to affected areas on face 3 nights a week, Monday, Wednesday and Fridays   ketorolac  0.5 % ophthalmic solution Commonly known as: ACULAR  Place 1 drop into the right eye 4 (four) times daily.   ketorolac  0.5 % ophthalmic solution Commonly known as: ACULAR  Instill one (1) drop into the left eye four (4) times daily as directed.   lisinopril  10 MG tablet Commonly known as: ZESTRIL  Take 1 tablet (10 mg total) by mouth daily.   metoprolol  succinate 100 MG 24 hr tablet Commonly known as: TOPROL -XL Take 2 tablets (200 mg total) by mouth daily.   mirtazapine  15 MG tablet Commonly known as: REMERON  Take 1 tablet (15 mg total) by mouth Nightly.   omeprazole  40 MG capsule Commonly known as: PRILOSEC Take 1 capsule (40 mg total) by mouth daily.   prednisoLONE  acetate 1 % ophthalmic suspension Commonly known as: PRED FORTE  Place 1 drop into the right eye 4 (four) times daily. Please shake  before each use.   prednisoLONE  acetate 1 % ophthalmic suspension Commonly known as: PRED FORTE  Instill one (1) drop into left eye four (4) times a day as directed. Shake well before use.   pseudoephedrine -guaifenesin  60-600 MG 12 hr tablet Commonly known as: MUCINEX  D Take 1 tablet by mouth every 12 (twelve) hours for 5 days   rosuvastatin  10 MG tablet Commonly known as: CRESTOR  Take 1 tablet (10 mg total) by mouth once daily   Spiriva  Respimat 2.5 MCG/ACT Aers Generic drug: Tiotropium Bromide  Inhale 2 puffs into the lungs daily.   tamsulosin  0.4 MG Caps capsule Commonly known as: FLOMAX  Take 1 capsule (0.4 mg total) by mouth once daily TAKE 30 MINUTES AFTER SAME MEAL EACH DAY   traMADol  50 MG tablet Commonly known as: ULTRAM  Take 1 tablet (50 mg total) by mouth every 6 (six) hours as needed for pain for up to 7 days.   Vitamin D  (Ergocalciferol ) 1.25 MG (50000 UNIT) Caps capsule Commonly known as: DRISDOL  Take 1 capsule (50,000 Units total) by mouth every 7 (seven) days.       Verbal and written Discharge instructions given to the patient. Wound care per Discharge AVS  Follow-up Information     Dew, Selinda RAMAN, MD Follow up in 4 week(s).   Specialties: Vascular Surgery, Radiology, Interventional Cardiology Why: Carotid ultrasound Contact information: [address] [place] [address] [phone]                 Signed: Orvin FORBES Daring, NP  04/06/2024, 5:05 PM   "

## 2024-04-06 NOTE — Discharge Instructions (Signed)
 Vascular surgery discharge instructions  Do not lift anything heavy for the next 3 weeks.  Do not lift anything more than a gallon of milk.  After you go home you may shower the following day.  Shower with the dressing to your right groin in place.  If it does not come off in the shower or remove it immediately afterward.  Pat completely dry.  Place a Band-Aid over the puncture site every day for the next 3 days.  Do not drive for 1 week.  Do not drive if you are taking any narcotic medications.  You are being discharged on Eliquis  5 mg twice daily and Plavix  75 mg once daily and Crestor  10 mg daily.  Patient was instructed not to skip or miss taking any of these medications as it will interfere with the outcome of his procedure.  Follow-up with vein and vascular services as scheduled.

## 2024-04-06 NOTE — Progress Notes (Signed)
 Patient being discharged home. Chest port deaccessed. Went over discharge instructions with patient and patients friend Alisa. Both agreed they understood and all questions were answered. Patient going home POV with friend Alisa. Patient taken out via wheelchair.

## 2024-04-06 NOTE — Progress Notes (Signed)
 " Progress Note    04/06/2024 8:13 AM 2 Days Post-Op  Subjective:   Blake [name] is a 79 yo male who is POD #2 from:   DATE: 04/04/2024   PROCEDURE:  Ultrasound guidance for vascular access right femoral artery  Placement of a 7 mm diameter by 3 cm length exact stent with the use of the NAV-6 embolic protection device in the right internal carotid artery   PRE-OPERATIVE DIAGNOSIS: 1.  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 2.  Previous head neck radiation   POST-OPERATIVE DIAGNOSIS:  Same as above   SURGEON: Selinda Gu, MD   ASSISTANT(S): None   ANESTHESIA: local/MCS   ESTIMATED BLOOD LOSS: 25 cc   CONTRAST: 65 cc   FLUORO TIME: 5 minutes   MODERATE CONSCIOUS SEDATION TIME:  Approximately 37 minutes using 1 mg of Versed  and 50 mcg of Fentanyl    FINDING(S): 1.   90% right internal carotid artery stenosis distal to the previously placed stent   Vitals:   04/06/24 0500 04/06/24 0600  BP: (!) 163/76 (!) 179/86  Pulse:  84  Resp: 18 16  Temp:    SpO2:  100%   Physical Exam: Cardiac:  RRR, Normal S1, S2. No murmurs appreciated.  Lungs:  Clear throughout on auscultation. No rales rhonchi or wheezing noted. Non labored breathing.  Incisions:  Right groin puncture site with dressing clean dry and intact.  Extremities:  All extremities are warm to touch with palpable pulses.  Abdomen:  Positive bowel sounds throughout, soft non tender and non distended.  Neurologic: AAOX3, answers all questions and follows commands.   CBC    Component Value Date/Time   WBC 9.1 04/05/2024 0500   RBC 3.40 (L) 04/05/2024 0500   HGB 10.1 (L) 04/05/2024 0500   HGB 12.9 (L) 10/28/2023 1317   HGB 11.8 (L) 01/26/2013 0505   HCT 31.7 (L) 04/05/2024 0500   HCT 47.5 01/12/2013 1457   PLT 220 04/05/2024 0500   PLT 200 10/28/2023 1317   PLT 220 01/26/2013 0505   MCV 93.2 04/05/2024 0500   MCV 90 01/12/2013 1457   MCH 29.7 04/05/2024 0500   MCHC  31.9 04/05/2024 0500   RDW 13.4 04/05/2024 0500   RDW 13.7 01/12/2013 1457   LYMPHSABS 0.7 10/28/2023 1317   LYMPHSABS 0.9 (L) 08/11/2011 1440   MONOABS 0.5 10/28/2023 1317   MONOABS 0.4 08/11/2011 1440   EOSABS 0.1 10/28/2023 1317   EOSABS 0.0 08/11/2011 1440   BASOSABS 0.1 10/28/2023 1317   BASOSABS 0.0 08/11/2011 1440    BMET    Component Value Date/Time   NA 142 04/06/2024 0500   NA 130 (L) 01/26/2013 0505   K 4.4 04/06/2024 0500   K 4.6 01/26/2013 0505   CL 107 04/06/2024 0500   CL 101 01/26/2013 0505   CO2 25 04/06/2024 0500   CO2 26 01/26/2013 0505   GLUCOSE 113 (H) 04/06/2024 0500   GLUCOSE 138 (H) 01/26/2013 0505   BUN 20 04/06/2024 0500   BUN 19 (H) 01/26/2013 0505   CREATININE 1.52 (H) 04/06/2024 0500   CREATININE 1.11 10/28/2023 1317   CREATININE 1.18 01/26/2013 0505   CALCIUM  8.4 (L) 04/06/2024 0500   CALCIUM  8.3 (L) 01/26/2013 0505   GFRNONAA 47 (L) 04/06/2024 0500   GFRNONAA >60 10/28/2023 1317   GFRNONAA >60 01/26/2013 0505   GFRAA >60 12/28/2019 1328   GFRAA >60 01/26/2013 0505    INR    Component Value Date/Time  INR 1.0 12/28/2023 1227   INR 0.9 01/12/2013 1457     Intake/Output Summary (Last 24 hours) at 04/06/2024 0813 Last data filed at 04/06/2024 0548 Gross per 24 hour  Intake 341.43 ml  Output 1100 ml  Net -758.57 ml     Assessment/Plan:  79 y.o. male is s/p SEE ABOVE  2 Days Post-Op   PLAN Patient was on levophed  yesterday and it was stopped at 15:30 pm yesterday. Patient blood pressure has remained WNL and slightly elevated overnight. Patient will receive his previous outpatient blood pressure medications this morning. We will continue to monitor his status. If he does well and BP remains WNL and he does not have any difficulties ambulating, eating or urinating patient may be able to discharge later this afternoon.    DVT prophylaxis:  Eliquis  5 mg Daily, Plavix  75 mg Daily.    Gwendlyn JONELLE [facility]ank [facility] 04/06/2024 8:13 AM   "

## 2024-04-11 ENCOUNTER — Inpatient Hospital Stay: Attending: Oncology

## 2024-04-11 DIAGNOSIS — Z452 Encounter for adjustment and management of vascular access device: Secondary | ICD-10-CM | POA: Diagnosis not present

## 2024-04-11 DIAGNOSIS — Z8501 Personal history of malignant neoplasm of esophagus: Secondary | ICD-10-CM | POA: Insufficient documentation

## 2024-04-15 ENCOUNTER — Other Ambulatory Visit: Payer: Self-pay

## 2024-04-16 ENCOUNTER — Other Ambulatory Visit: Payer: Self-pay

## 2024-04-17 MED ORDER — OMEPRAZOLE 40 MG PO CPDR
40.0000 mg | DELAYED_RELEASE_CAPSULE | Freq: Every day | ORAL | 1 refills | Status: AC
  Filled 2024-04-17: qty 90, 90d supply, fill #0

## 2024-04-18 ENCOUNTER — Other Ambulatory Visit: Payer: Self-pay

## 2024-04-27 ENCOUNTER — Other Ambulatory Visit: Payer: Self-pay

## 2024-04-27 MED ORDER — METOPROLOL SUCCINATE ER 100 MG PO TB24
200.0000 mg | ORAL_TABLET | Freq: Every day | ORAL | 1 refills | Status: AC
  Filled 2024-04-27: qty 180, 90d supply, fill #0

## 2024-05-02 ENCOUNTER — Encounter (INDEPENDENT_AMBULATORY_CARE_PROVIDER_SITE_OTHER)

## 2024-05-02 ENCOUNTER — Ambulatory Visit (INDEPENDENT_AMBULATORY_CARE_PROVIDER_SITE_OTHER): Admitting: Nurse Practitioner

## 2024-05-03 ENCOUNTER — Other Ambulatory Visit: Payer: Self-pay

## 2024-05-20 ENCOUNTER — Ambulatory Visit (INDEPENDENT_AMBULATORY_CARE_PROVIDER_SITE_OTHER): Admitting: Nurse Practitioner

## 2024-05-20 ENCOUNTER — Encounter (INDEPENDENT_AMBULATORY_CARE_PROVIDER_SITE_OTHER)

## 2024-06-06 ENCOUNTER — Inpatient Hospital Stay: Attending: Oncology

## 2024-06-28 ENCOUNTER — Ambulatory Visit: Admitting: Dermatology

## 2024-08-01 ENCOUNTER — Inpatient Hospital Stay: Attending: Oncology

## 2024-09-09 ENCOUNTER — Other Ambulatory Visit

## 2024-09-16 ENCOUNTER — Ambulatory Visit: Admitting: Oncology

## 2025-01-03 ENCOUNTER — Ambulatory Visit: Admitting: Dermatology
